# Patient Record
Sex: Female | Born: 1945
Health system: Southern US, Community
[De-identification: ages and names within clinical notes are randomized; demographics above are authoritative.]

## PROBLEM LIST (undated history)

## (undated) DIAGNOSIS — J101 Influenza due to other identified influenza virus with other respiratory manifestations: Secondary | ICD-10-CM

## (undated) DIAGNOSIS — E785 Hyperlipidemia, unspecified: Secondary | ICD-10-CM

## (undated) DIAGNOSIS — K219 Gastro-esophageal reflux disease without esophagitis: Secondary | ICD-10-CM

## (undated) DIAGNOSIS — Z9289 Personal history of other medical treatment: Secondary | ICD-10-CM

## (undated) DIAGNOSIS — K279 Peptic ulcer, site unspecified, unspecified as acute or chronic, without hemorrhage or perforation: Secondary | ICD-10-CM

## (undated) DIAGNOSIS — Z9889 Other specified postprocedural states: Secondary | ICD-10-CM

## (undated) DIAGNOSIS — I469 Cardiac arrest, cause unspecified: Secondary | ICD-10-CM

## (undated) DIAGNOSIS — R112 Nausea with vomiting, unspecified: Secondary | ICD-10-CM

## (undated) DIAGNOSIS — J189 Pneumonia, unspecified organism: Secondary | ICD-10-CM

## (undated) DIAGNOSIS — I10 Essential (primary) hypertension: Secondary | ICD-10-CM

## (undated) DIAGNOSIS — Z720 Tobacco use: Secondary | ICD-10-CM

## (undated) DIAGNOSIS — I2510 Atherosclerotic heart disease of native coronary artery without angina pectoris: Secondary | ICD-10-CM

## (undated) DIAGNOSIS — I219 Acute myocardial infarction, unspecified: Secondary | ICD-10-CM

## (undated) DIAGNOSIS — R55 Syncope and collapse: Secondary | ICD-10-CM

## (undated) DIAGNOSIS — I4891 Unspecified atrial fibrillation: Secondary | ICD-10-CM

## (undated) DIAGNOSIS — I493 Ventricular premature depolarization: Secondary | ICD-10-CM

## (undated) DIAGNOSIS — T8859XA Other complications of anesthesia, initial encounter: Secondary | ICD-10-CM

## (undated) HISTORY — DX: Ventricular premature depolarization: I49.3

## (undated) HISTORY — DX: Personal history of other medical treatment: Z92.89

## (undated) HISTORY — DX: Acute myocardial infarction, unspecified: I21.9

## (undated) HISTORY — PX: TUBAL LIGATION: SHX77

## (undated) HISTORY — DX: Atherosclerotic heart disease of native coronary artery without angina pectoris: I25.10

## (undated) HISTORY — DX: Unspecified atrial fibrillation: I48.91

## (undated) HISTORY — PX: BREAST EXCISIONAL BIOPSY: SUR124

## (undated) HISTORY — DX: Syncope and collapse: R55

## (undated) HISTORY — PX: CORONARY ANGIOPLASTY WITH STENT PLACEMENT: SHX49

---

## 1994-11-10 DIAGNOSIS — R55 Syncope and collapse: Secondary | ICD-10-CM

## 1994-11-10 HISTORY — DX: Syncope and collapse: R55

## 2011-11-21 DIAGNOSIS — J209 Acute bronchitis, unspecified: Secondary | ICD-10-CM | POA: Diagnosis not present

## 2012-03-14 DIAGNOSIS — J019 Acute sinusitis, unspecified: Secondary | ICD-10-CM | POA: Diagnosis not present

## 2012-07-22 DIAGNOSIS — H2510 Age-related nuclear cataract, unspecified eye: Secondary | ICD-10-CM | POA: Diagnosis not present

## 2012-10-10 DIAGNOSIS — I469 Cardiac arrest, cause unspecified: Secondary | ICD-10-CM

## 2012-10-10 DIAGNOSIS — J101 Influenza due to other identified influenza virus with other respiratory manifestations: Secondary | ICD-10-CM

## 2012-10-10 DIAGNOSIS — J189 Pneumonia, unspecified organism: Secondary | ICD-10-CM

## 2012-10-10 HISTORY — DX: Influenza due to other identified influenza virus with other respiratory manifestations: J10.1

## 2012-10-10 HISTORY — DX: Cardiac arrest, cause unspecified: I46.9

## 2012-10-10 HISTORY — DX: Pneumonia, unspecified organism: J18.9

## 2012-10-28 DIAGNOSIS — J209 Acute bronchitis, unspecified: Secondary | ICD-10-CM | POA: Diagnosis not present

## 2012-10-29 ENCOUNTER — Encounter (HOSPITAL_COMMUNITY): Payer: Self-pay | Admitting: Physician Assistant

## 2012-10-29 ENCOUNTER — Inpatient Hospital Stay (HOSPITAL_COMMUNITY)
Admission: EM | Admit: 2012-10-29 | Discharge: 2012-11-09 | DRG: 237 | Disposition: A | Discharge status: Rehabilitation Facility | Payer: Medicare Other | Attending: Cardiovascular Disease | Admitting: Cardiovascular Disease

## 2012-10-29 ENCOUNTER — Encounter (HOSPITAL_COMMUNITY): Payer: Self-pay | Admitting: Anesthesiology

## 2012-10-29 ENCOUNTER — Inpatient Hospital Stay (HOSPITAL_COMMUNITY): Payer: Medicare Other

## 2012-10-29 ENCOUNTER — Encounter (HOSPITAL_COMMUNITY)
Admission: EM | Disposition: A | Discharge status: Rehabilitation Facility | Payer: Self-pay | Source: Home / Self Care | Attending: Cardiovascular Disease

## 2012-10-29 ENCOUNTER — Emergency Department (HOSPITAL_COMMUNITY): Payer: Medicare Other | Admitting: Anesthesiology

## 2012-10-29 DIAGNOSIS — I2119 ST elevation (STEMI) myocardial infarction involving other coronary artery of inferior wall: Secondary | ICD-10-CM | POA: Diagnosis not present

## 2012-10-29 DIAGNOSIS — R4182 Altered mental status, unspecified: Secondary | ICD-10-CM | POA: Diagnosis not present

## 2012-10-29 DIAGNOSIS — G9340 Encephalopathy, unspecified: Secondary | ICD-10-CM | POA: Diagnosis not present

## 2012-10-29 DIAGNOSIS — R7309 Other abnormal glucose: Secondary | ICD-10-CM | POA: Diagnosis not present

## 2012-10-29 DIAGNOSIS — I252 Old myocardial infarction: Secondary | ICD-10-CM | POA: Diagnosis not present

## 2012-10-29 DIAGNOSIS — J4489 Other specified chronic obstructive pulmonary disease: Secondary | ICD-10-CM | POA: Diagnosis present

## 2012-10-29 DIAGNOSIS — I442 Atrioventricular block, complete: Secondary | ICD-10-CM | POA: Diagnosis present

## 2012-10-29 DIAGNOSIS — E872 Acidosis, unspecified: Secondary | ICD-10-CM | POA: Diagnosis not present

## 2012-10-29 DIAGNOSIS — J129 Viral pneumonia, unspecified: Secondary | ICD-10-CM | POA: Diagnosis present

## 2012-10-29 DIAGNOSIS — J95821 Acute postprocedural respiratory failure: Secondary | ICD-10-CM | POA: Diagnosis not present

## 2012-10-29 DIAGNOSIS — R570 Cardiogenic shock: Secondary | ICD-10-CM

## 2012-10-29 DIAGNOSIS — R5381 Other malaise: Secondary | ICD-10-CM | POA: Diagnosis not present

## 2012-10-29 DIAGNOSIS — J1100 Influenza due to unidentified influenza virus with unspecified type of pneumonia: Secondary | ICD-10-CM | POA: Diagnosis present

## 2012-10-29 DIAGNOSIS — J449 Chronic obstructive pulmonary disease, unspecified: Secondary | ICD-10-CM | POA: Diagnosis present

## 2012-10-29 DIAGNOSIS — F17200 Nicotine dependence, unspecified, uncomplicated: Secondary | ICD-10-CM | POA: Diagnosis present

## 2012-10-29 DIAGNOSIS — I469 Cardiac arrest, cause unspecified: Secondary | ICD-10-CM | POA: Diagnosis not present

## 2012-10-29 DIAGNOSIS — J189 Pneumonia, unspecified organism: Secondary | ICD-10-CM | POA: Diagnosis not present

## 2012-10-29 DIAGNOSIS — R079 Chest pain, unspecified: Secondary | ICD-10-CM | POA: Diagnosis not present

## 2012-10-29 DIAGNOSIS — E8779 Other fluid overload: Secondary | ICD-10-CM | POA: Diagnosis not present

## 2012-10-29 DIAGNOSIS — Z955 Presence of coronary angioplasty implant and graft: Secondary | ICD-10-CM

## 2012-10-29 DIAGNOSIS — K219 Gastro-esophageal reflux disease without esophagitis: Secondary | ICD-10-CM | POA: Diagnosis present

## 2012-10-29 DIAGNOSIS — Z452 Encounter for adjustment and management of vascular access device: Secondary | ICD-10-CM | POA: Diagnosis not present

## 2012-10-29 DIAGNOSIS — T502X5A Adverse effect of carbonic-anhydrase inhibitors, benzothiadiazides and other diuretics, initial encounter: Secondary | ICD-10-CM | POA: Diagnosis not present

## 2012-10-29 DIAGNOSIS — R195 Other fecal abnormalities: Secondary | ICD-10-CM | POA: Diagnosis not present

## 2012-10-29 DIAGNOSIS — D3500 Benign neoplasm of unspecified adrenal gland: Secondary | ICD-10-CM | POA: Diagnosis not present

## 2012-10-29 DIAGNOSIS — G9349 Other encephalopathy: Secondary | ICD-10-CM | POA: Diagnosis not present

## 2012-10-29 DIAGNOSIS — D62 Acute posthemorrhagic anemia: Secondary | ICD-10-CM

## 2012-10-29 DIAGNOSIS — E785 Hyperlipidemia, unspecified: Secondary | ICD-10-CM

## 2012-10-29 DIAGNOSIS — Z4682 Encounter for fitting and adjustment of non-vascular catheter: Secondary | ICD-10-CM | POA: Diagnosis not present

## 2012-10-29 DIAGNOSIS — J9819 Other pulmonary collapse: Secondary | ICD-10-CM | POA: Diagnosis not present

## 2012-10-29 DIAGNOSIS — J90 Pleural effusion, not elsewhere classified: Secondary | ICD-10-CM | POA: Diagnosis not present

## 2012-10-29 DIAGNOSIS — Z5189 Encounter for other specified aftercare: Secondary | ICD-10-CM | POA: Diagnosis not present

## 2012-10-29 DIAGNOSIS — Z720 Tobacco use: Secondary | ICD-10-CM

## 2012-10-29 DIAGNOSIS — Z79899 Other long term (current) drug therapy: Secondary | ICD-10-CM

## 2012-10-29 DIAGNOSIS — J9600 Acute respiratory failure, unspecified whether with hypoxia or hypercapnia: Secondary | ICD-10-CM | POA: Diagnosis not present

## 2012-10-29 DIAGNOSIS — R0989 Other specified symptoms and signs involving the circulatory and respiratory systems: Secondary | ICD-10-CM | POA: Diagnosis not present

## 2012-10-29 DIAGNOSIS — R29898 Other symptoms and signs involving the musculoskeletal system: Secondary | ICD-10-CM

## 2012-10-29 DIAGNOSIS — J988 Other specified respiratory disorders: Secondary | ICD-10-CM | POA: Diagnosis not present

## 2012-10-29 DIAGNOSIS — I4901 Ventricular fibrillation: Secondary | ICD-10-CM | POA: Diagnosis not present

## 2012-10-29 DIAGNOSIS — R059 Cough, unspecified: Secondary | ICD-10-CM | POA: Diagnosis not present

## 2012-10-29 DIAGNOSIS — J1000 Influenza due to other identified influenza virus with unspecified type of pneumonia: Secondary | ICD-10-CM | POA: Diagnosis not present

## 2012-10-29 DIAGNOSIS — F341 Dysthymic disorder: Secondary | ICD-10-CM | POA: Diagnosis not present

## 2012-10-29 DIAGNOSIS — J811 Chronic pulmonary edema: Secondary | ICD-10-CM | POA: Diagnosis not present

## 2012-10-29 DIAGNOSIS — I498 Other specified cardiac arrhythmias: Secondary | ICD-10-CM | POA: Diagnosis present

## 2012-10-29 DIAGNOSIS — I472 Ventricular tachycardia: Secondary | ICD-10-CM

## 2012-10-29 DIAGNOSIS — E876 Hypokalemia: Secondary | ICD-10-CM | POA: Diagnosis not present

## 2012-10-29 DIAGNOSIS — I10 Essential (primary) hypertension: Secondary | ICD-10-CM | POA: Diagnosis not present

## 2012-10-29 DIAGNOSIS — I2510 Atherosclerotic heart disease of native coronary artery without angina pectoris: Secondary | ICD-10-CM

## 2012-10-29 DIAGNOSIS — I4729 Other ventricular tachycardia: Secondary | ICD-10-CM

## 2012-10-29 DIAGNOSIS — R110 Nausea: Secondary | ICD-10-CM | POA: Diagnosis not present

## 2012-10-29 DIAGNOSIS — G931 Anoxic brain damage, not elsewhere classified: Secondary | ICD-10-CM | POA: Diagnosis not present

## 2012-10-29 DIAGNOSIS — F22 Delusional disorders: Secondary | ICD-10-CM

## 2012-10-29 HISTORY — DX: Other specified postprocedural states: Z98.890

## 2012-10-29 HISTORY — DX: Other specified postprocedural states: R11.2

## 2012-10-29 HISTORY — PX: PERCUTANEOUS CORONARY STENT INTERVENTION (PCI-S): SHX5485

## 2012-10-29 HISTORY — DX: Pneumonia, unspecified organism: J18.9

## 2012-10-29 HISTORY — PX: LEFT HEART CATHETERIZATION WITH CORONARY ANGIOGRAM: SHX5451

## 2012-10-29 HISTORY — DX: Influenza due to other identified influenza virus with other respiratory manifestations: J10.1

## 2012-10-29 HISTORY — DX: Essential (primary) hypertension: I10

## 2012-10-29 HISTORY — DX: Hyperlipidemia, unspecified: E78.5

## 2012-10-29 HISTORY — DX: Gastro-esophageal reflux disease without esophagitis: K21.9

## 2012-10-29 HISTORY — DX: Tobacco use: Z72.0

## 2012-10-29 HISTORY — DX: Other complications of anesthesia, initial encounter: T88.59XA

## 2012-10-29 HISTORY — PX: INTRA-AORTIC BALLOON PUMP INSERTION: SHX5475

## 2012-10-29 HISTORY — DX: Peptic ulcer, site unspecified, unspecified as acute or chronic, without hemorrhage or perforation: K27.9

## 2012-10-29 HISTORY — DX: Cardiac arrest, cause unspecified: I46.9

## 2012-10-29 LAB — COMPREHENSIVE METABOLIC PANEL
AST: 33 U/L (ref 0–37)
BUN: 15 mg/dL (ref 6–23)
CO2: 17 mEq/L — ABNORMAL LOW (ref 19–32)
Chloride: 103 mEq/L (ref 96–112)
Creatinine, Ser: 0.72 mg/dL (ref 0.50–1.10)
GFR calc Af Amer: >90 mL/min (ref >90)
GFR calc non Af Amer: 86 mL/min — ABNORMAL LOW (ref >90)
Glucose, Bld: 249 mg/dL — ABNORMAL HIGH (ref 70–99)
Total Bilirubin: 0.1 mg/dL — ABNORMAL LOW (ref 0.3–1.2)

## 2012-10-29 LAB — BASIC METABOLIC PANEL
CO2: 23 mEq/L (ref 19–32)
Calcium: 8.2 mg/dL — ABNORMAL LOW (ref 8.4–10.5)
Chloride: 103 mEq/L (ref 96–112)
Creatinine, Ser: 0.6 mg/dL (ref 0.50–1.10)
GFR calc Af Amer: >90 mL/min (ref >90)
GFR calc Af Amer: >90 mL/min (ref >90)
GFR calc non Af Amer: >90 mL/min (ref >90)
GFR calc non Af Amer: 85 mL/min — ABNORMAL LOW (ref >90)
Sodium: 142 mEq/L (ref 135–145)

## 2012-10-29 LAB — CBC WITH DIFFERENTIAL/PLATELET
Basophils Absolute: 0 10*3/uL (ref 0.0–0.1)
Eosinophils Relative: 1 % (ref 0–5)
HCT: 41.3 % (ref 36.0–46.0)
Hemoglobin: 13.5 g/dL (ref 12.0–15.0)
Lymphocytes Relative: 18 % (ref 12–46)
Lymphs Abs: 2.8 10*3/uL (ref 0.7–4.0)
MCV: 96.5 fL (ref 78.0–100.0)
Monocytes Absolute: 1.7 10*3/uL — ABNORMAL HIGH (ref 0.1–1.0)
Monocytes Relative: 11 % (ref 3–12)
Neutro Abs: 10.7 10*3/uL — ABNORMAL HIGH (ref 1.7–7.7)
RBC: 4.28 MIL/uL (ref 3.87–5.11)
WBC: 15.3 10*3/uL — ABNORMAL HIGH (ref 4.0–10.5)

## 2012-10-29 LAB — BLOOD GAS, ARTERIAL
Drawn by: 129711
MECHVT: 400 mL
PEEP: 5 cmH2O
RATE: 24 resp/min
pCO2 arterial: 54.1 mmHg — ABNORMAL HIGH (ref 35.0–45.0)
pH, Arterial: 7.253 — ABNORMAL LOW (ref 7.350–7.450)

## 2012-10-29 LAB — LIPID PANEL
Cholesterol: 187 mg/dL (ref 0–200)
HDL: 41 mg/dL (ref >39)
LDL Cholesterol: 107 mg/dL — ABNORMAL HIGH (ref 0–99)
Triglycerides: 194 mg/dL — ABNORMAL HIGH (ref <150)
VLDL: 39 mg/dL (ref 0–40)

## 2012-10-29 LAB — POCT I-STAT 3, ART BLOOD GAS (G3+)
Acid-base deficit: 13 mmol/L — ABNORMAL HIGH (ref 0.0–2.0)
Acid-base deficit: 6 mmol/L — ABNORMAL HIGH (ref 0.0–2.0)
Bicarbonate: 21.4 mEq/L (ref 20.0–24.0)
Bicarbonate: 24.5 mEq/L — ABNORMAL HIGH (ref 20.0–24.0)
Patient temperature: 95.2
TCO2: 18 mmol/L (ref 0–100)
TCO2: 23 mmol/L (ref 0–100)
TCO2: 22 mmol/L (ref 0–100)
pCO2 arterial: 55.9 mmHg — ABNORMAL HIGH (ref 35.0–45.0)
pH, Arterial: 7.103 — CL (ref 7.350–7.450)
pH, Arterial: 7.18 — CL (ref 7.350–7.450)
pH, Arterial: 7.146 — CL (ref 7.350–7.450)
pH, Arterial: 7.282 — ABNORMAL LOW (ref 7.350–7.450)
pO2, Arterial: 87 mmHg (ref 80.0–100.0)
pO2, Arterial: 85 mmHg (ref 80.0–100.0)

## 2012-10-29 LAB — TROPONIN I
Troponin I: <0.3 ng/mL (ref <0.30)
Troponin I: >20 ng/mL (ref <0.30)

## 2012-10-29 LAB — CBC
HCT: 38 % (ref 36.0–46.0)
MCH: 31.1 pg (ref 26.0–34.0)
MCH: 31.1 pg (ref 26.0–34.0)
MCHC: 33.6 g/dL (ref 30.0–36.0)
MCHC: 33.2 g/dL (ref 30.0–36.0)
MCV: 92.5 fL (ref 78.0–100.0)
Platelets: 305 10*3/uL (ref 150–400)
RDW: 14.6 % (ref 11.5–15.5)

## 2012-10-29 LAB — MAGNESIUM: Magnesium: 1.5 mg/dL (ref 1.5–2.5)

## 2012-10-29 LAB — INFLUENZA PANEL BY PCR (TYPE A & B)
Influenza A By PCR: POSITIVE — AB
Influenza B By PCR: NEGATIVE

## 2012-10-29 LAB — GLUCOSE, CAPILLARY
Glucose-Capillary: 284 mg/dL — ABNORMAL HIGH (ref 70–99)
Glucose-Capillary: 187 mg/dL — ABNORMAL HIGH (ref 70–99)

## 2012-10-29 LAB — POCT I-STAT, CHEM 8
Calcium, Ion: 1.06 mmol/L — ABNORMAL LOW (ref 1.13–1.30)
Creatinine, Ser: 0.6 mg/dL (ref 0.50–1.10)
Hemoglobin: 12.9 g/dL (ref 12.0–15.0)
Sodium: 128 mEq/L — ABNORMAL LOW (ref 135–145)
TCO2: 17 mmol/L (ref 0–100)

## 2012-10-29 LAB — STREP PNEUMONIAE URINARY ANTIGEN: Strep Pneumo Urinary Antigen: NEGATIVE

## 2012-10-29 LAB — MRSA PCR SCREENING: MRSA by PCR: NEGATIVE

## 2012-10-29 LAB — LACTIC ACID, PLASMA: Lactic Acid, Venous: 3 mmol/L — ABNORMAL HIGH (ref 0.5–2.2)

## 2012-10-29 LAB — PROTIME-INR: INR: 1.03 (ref 0.00–1.49)

## 2012-10-29 LAB — PROCALCITONIN: Procalcitonin: <0.1 ng/mL

## 2012-10-29 IMAGING — CR DG CHEST 1V PORT
1 series · 1 of 1 positions shown · non-contrast
Comparison: None.

CLINICAL DATA: Respiratory difficulty

PORTABLE CHEST - 1 VIEW

[AP]
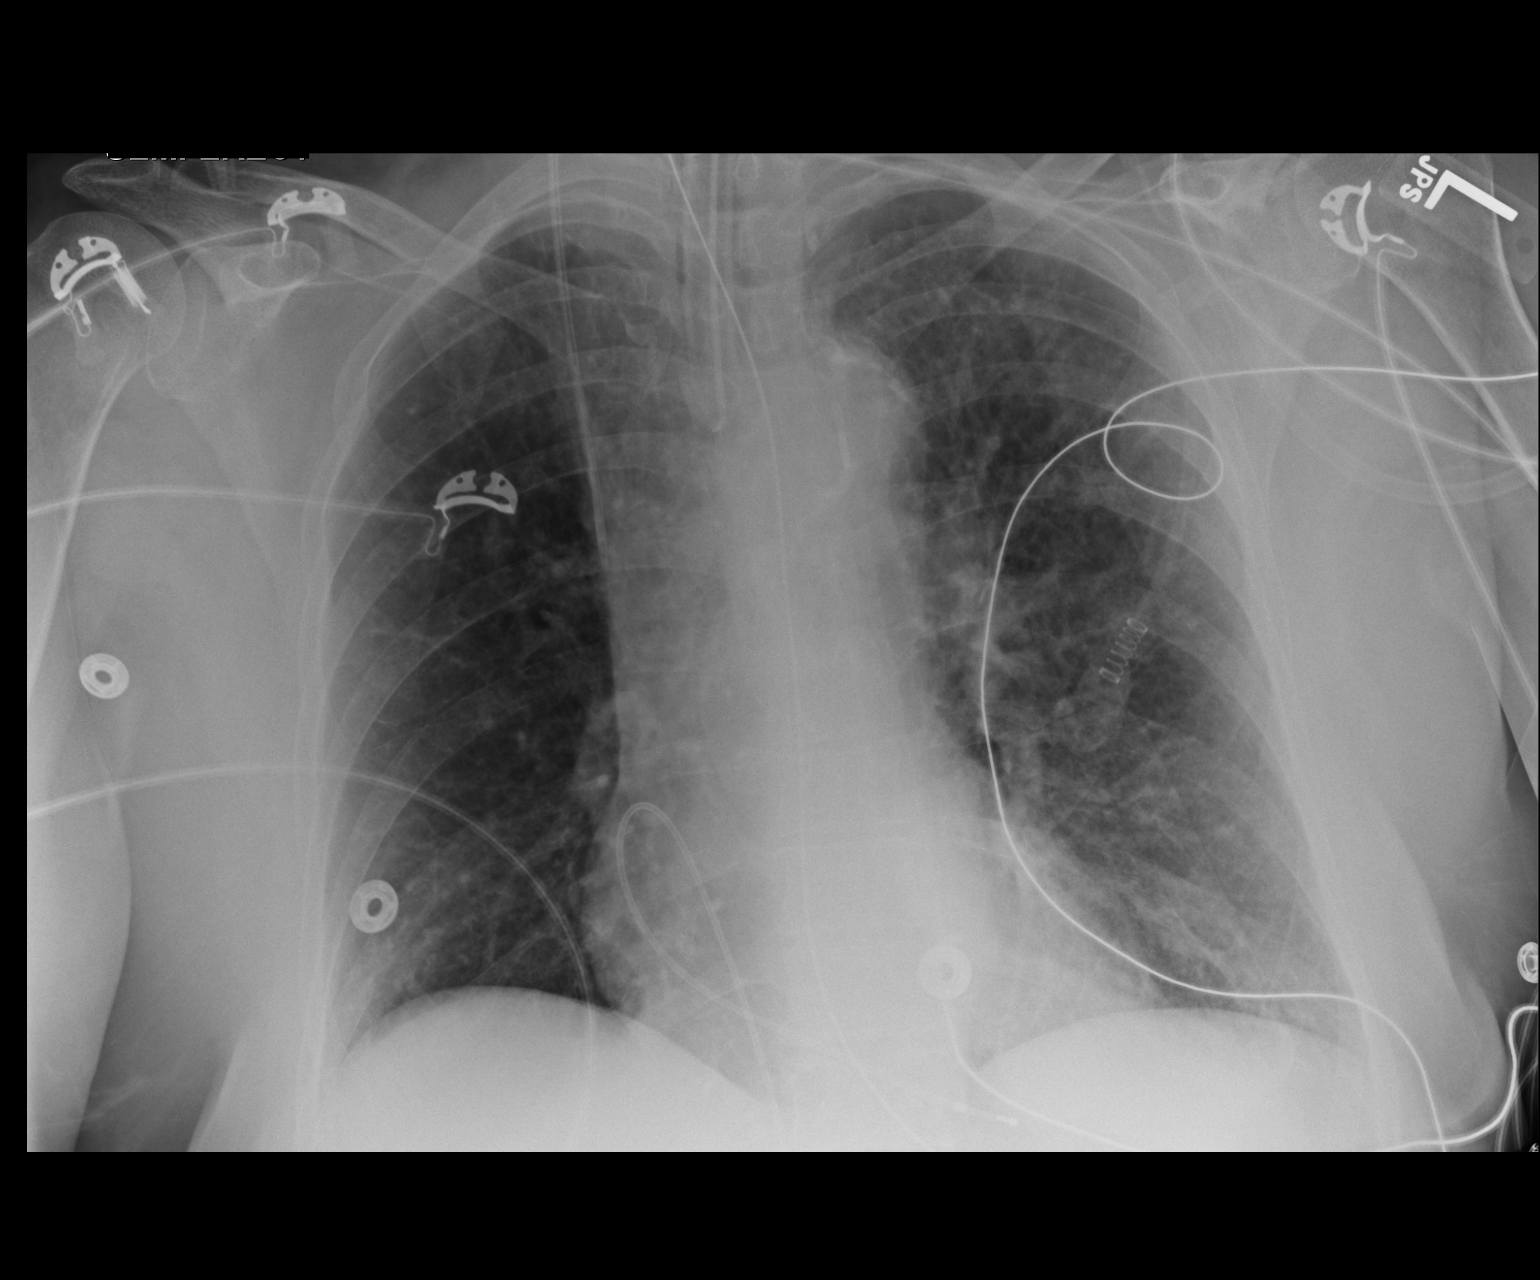

[1 of 1 positions shown; findings below may reference images not displayed]

FINDINGS: Endotracheal tube is 3.9 cm from the carina.  NG tube is
beyond the gastroesophageal junction.  Transvenous pacemaker from
the IVC in place with its tip projecting over the tip of the right
ventricle.  Right internal jugular vein center venous catheter tip
in the mid SVC.  Intra-aortic balloon pump tip in the proximal
descending aorta.  Low lung volumes.  Normal heart size.  No
pneumothorax.  Minimal patchy density at the lung bases.
IMPRESSION: Tubular structures, transvenous pacer and intra-aortic balloon pump
appropriately positioned.

Bibasilar atelectasis verses airspace disease.

## 2012-10-29 SURGERY — LEFT HEART CATHETERIZATION WITH CORONARY ANGIOGRAM
Anesthesia: LOCAL

## 2012-10-29 MED ORDER — ACETAMINOPHEN 325 MG PO TABS: 650.0000 mg | ORAL_TABLET | ORAL | Status: DC | PRN

## 2012-10-29 MED ORDER — INSULIN ASPART 100 UNIT/ML ~~LOC~~ SOLN
2.0000 [IU] | SUBCUTANEOUS | Status: DC
  Administered 2012-10-29: 6 [IU] via SUBCUTANEOUS

## 2012-10-29 MED ORDER — ATORVASTATIN CALCIUM 80 MG PO TABS
80.0000 mg | ORAL_TABLET | Freq: Every day | ORAL | Status: DC
  Administered 2012-10-29 – 2012-11-08 (×9): 80 mg via NASOGASTRIC
  Filled 2012-10-29 (×13): qty 1

## 2012-10-29 MED ORDER — BIVALIRUDIN 250 MG IV SOLR
INTRAVENOUS | Status: AC
  Filled 2012-10-29: qty 250

## 2012-10-29 MED ORDER — BIOTENE DRY MOUTH MT LIQD
15.0000 mL | Freq: Four times a day (QID) | OROMUCOSAL | Status: DC
  Administered 2012-10-29 – 2012-11-09 (×40): 15 mL via OROMUCOSAL

## 2012-10-29 MED ORDER — ASPIRIN EC 81 MG PO TBEC: 81.0000 mg | DELAYED_RELEASE_TABLET | Freq: Every day | ORAL | Status: DC

## 2012-10-29 MED ORDER — AMIODARONE HCL 150 MG/3ML IV SOLN
INTRAVENOUS | Status: AC
  Filled 2012-10-29: qty 3

## 2012-10-29 MED ORDER — AMIODARONE HCL IN DEXTROSE 360-4.14 MG/200ML-% IV SOLN
30.0000 mg/h | INTRAVENOUS | Status: DC
  Administered 2012-10-29 – 2012-10-30 (×2): 30 mg/h via INTRAVENOUS
  Filled 2012-10-29 (×2): qty 200

## 2012-10-29 MED ORDER — SODIUM BICARBONATE 8.4 % IV SOLN
INTRAVENOUS | Status: AC
  Filled 2012-10-29: qty 100

## 2012-10-29 MED ORDER — SODIUM CHLORIDE 0.9 % IV SOLN
250.0000 mL | INTRAVENOUS | Status: DC | PRN
  Administered 2012-10-30: 250 mL via INTRAVENOUS

## 2012-10-29 MED ORDER — POTASSIUM CHLORIDE 20 MEQ/15ML (10%) PO LIQD
ORAL | Status: AC
  Administered 2012-10-29: 40 meq via ORAL
  Filled 2012-10-29: qty 30

## 2012-10-29 MED ORDER — OSELTAMIVIR PHOSPHATE 75 MG PO CAPS
75.0000 mg | ORAL_CAPSULE | Freq: Every day | ORAL | Status: DC
  Administered 2012-10-29 – 2012-10-30 (×2): 75 mg via ORAL
  Filled 2012-10-29 (×2): qty 1

## 2012-10-29 MED ORDER — ALPRAZOLAM 0.25 MG PO TABS: 0.2500 mg | ORAL_TABLET | Freq: Two times a day (BID) | ORAL | Status: DC | PRN

## 2012-10-29 MED ORDER — NITROGLYCERIN 0.4 MG SL SUBL: 0.4000 mg | SUBLINGUAL_TABLET | SUBLINGUAL | Status: DC | PRN

## 2012-10-29 MED ORDER — ONDANSETRON HCL 4 MG/2ML IJ SOLN
INTRAMUSCULAR | Status: AC
  Filled 2012-10-29: qty 4

## 2012-10-29 MED ORDER — PRASUGREL HCL 10 MG PO TABS
60.0000 mg | ORAL_TABLET | ORAL | Status: AC
  Administered 2012-10-29: 60 mg

## 2012-10-29 MED ORDER — VECURONIUM BROMIDE 10 MG IV SOLR
INTRAVENOUS | Status: DC | PRN
  Administered 2012-10-29: 10 mg via INTRAVENOUS

## 2012-10-29 MED ORDER — NOREPINEPHRINE BITARTRATE 1 MG/ML IJ SOLN
2.0000 ug/min | INTRAVENOUS | Status: DC
  Administered 2012-10-29: 30 ug/min via INTRAVENOUS
  Administered 2012-10-29: 25 ug/min via INTRAVENOUS
  Administered 2012-10-30: 14 ug/min via INTRAVENOUS
  Administered 2012-11-01: 10 ug/min via INTRAVENOUS
  Administered 2012-11-02: 6 ug/min via INTRAVENOUS
  Administered 2012-11-02: 8 ug/min via INTRAVENOUS
  Filled 2012-10-29 (×5): qty 16

## 2012-10-29 MED ORDER — ONDANSETRON HCL 4 MG/2ML IJ SOLN
INTRAMUSCULAR | Status: AC
  Filled 2012-10-29: qty 2

## 2012-10-29 MED ORDER — CHLORHEXIDINE GLUCONATE 0.12 % MT SOLN
15.0000 mL | Freq: Two times a day (BID) | OROMUCOSAL | Status: DC
  Administered 2012-10-29 – 2012-11-04 (×14): 15 mL via OROMUCOSAL
  Filled 2012-10-29 (×13): qty 15

## 2012-10-29 MED ORDER — SODIUM BICARBONATE 8.4 % IV SOLN
INTRAVENOUS | Status: DC
  Filled 2012-10-29: qty 150

## 2012-10-29 MED ORDER — SODIUM CHLORIDE 0.9 % IV SOLN
25.0000 ug/h | INTRAVENOUS | Status: DC
  Administered 2012-10-30 (×2): 200 ug/h via INTRAVENOUS
  Administered 2012-10-31: 175 ug/h via INTRAVENOUS
  Administered 2012-10-31 – 2012-11-01 (×2): 200 ug/h via INTRAVENOUS
  Administered 2012-11-01: 75 ug/h via INTRAVENOUS
  Administered 2012-11-03: 150 ug/h via INTRAVENOUS
  Administered 2012-11-04: 100 ug/h via INTRAVENOUS
  Filled 2012-10-29 (×9): qty 50

## 2012-10-29 MED ORDER — NITROGLYCERIN 0.2 MG/ML ON CALL CATH LAB
INTRAVENOUS | Status: AC
  Filled 2012-10-29: qty 1

## 2012-10-29 MED ORDER — HEPARIN SODIUM (PORCINE) 5000 UNIT/ML IJ SOLN
4000.0000 [IU]/kg | Freq: Once | INTRAMUSCULAR | Status: AC
  Administered 2012-10-29: 4000 [IU] via INTRAVENOUS

## 2012-10-29 MED ORDER — SODIUM CHLORIDE 0.9 % IV SOLN
1.0000 mg/h | INTRAVENOUS | Status: DC
  Administered 2012-10-29 – 2012-10-30 (×3): 3 mg/h via INTRAVENOUS
  Administered 2012-10-31: 5 mg/h via INTRAVENOUS
  Administered 2012-10-31: 4 mg/h via INTRAVENOUS
  Administered 2012-11-01: 3 mg/h via INTRAVENOUS
  Administered 2012-11-02: 1 mg/h via INTRAVENOUS
  Administered 2012-11-03 – 2012-11-04 (×3): 4 mg/h via INTRAVENOUS
  Filled 2012-10-29 (×10): qty 10

## 2012-10-29 MED ORDER — SUCCINYLCHOLINE CHLORIDE 20 MG/ML IJ SOLN
INTRAMUSCULAR | Status: DC | PRN
  Administered 2012-10-29: 100 mg via INTRAVENOUS

## 2012-10-29 MED ORDER — NOREPINEPHRINE BITARTRATE 1 MG/ML IJ SOLN
2.0000 ug/min | INTRAVENOUS | Status: DC
  Filled 2012-10-29: qty 16

## 2012-10-29 MED ORDER — SODIUM CHLORIDE 0.9 % IV BOLUS (SEPSIS): 250.0000 mL | INTRAVENOUS | Status: DC | PRN

## 2012-10-29 MED ORDER — HEPARIN (PORCINE) IN NACL 2-0.9 UNIT/ML-% IJ SOLN
INTRAMUSCULAR | Status: AC
  Filled 2012-10-29: qty 1000

## 2012-10-29 MED ORDER — SODIUM CHLORIDE 0.9 % IV SOLN
INTRAVENOUS | Status: DC
  Administered 2012-10-29 – 2012-10-30 (×2): 125 mL/h via INTRAVENOUS

## 2012-10-29 MED ORDER — MIDAZOLAM HCL 2 MG/2ML IJ SOLN: 1.0000 mg | INTRAMUSCULAR | Status: DC | PRN

## 2012-10-29 MED ORDER — SODIUM BICARBONATE 8.4 % IV SOLN
INTRAVENOUS | Status: AC
  Filled 2012-10-29: qty 150

## 2012-10-29 MED ORDER — NOREPINEPHRINE BITARTRATE 1 MG/ML IJ SOLN
INTRAMUSCULAR | Status: AC
  Filled 2012-10-29: qty 4

## 2012-10-29 MED ORDER — ETOMIDATE 2 MG/ML IV SOLN
INTRAVENOUS | Status: DC | PRN
  Administered 2012-10-29: 12 mg via INTRAVENOUS

## 2012-10-29 MED ORDER — POTASSIUM CHLORIDE 10 MEQ/50ML IV SOLN
INTRAVENOUS | Status: AC
  Administered 2012-10-29: 10 meq via INTRAVENOUS
  Filled 2012-10-29: qty 100

## 2012-10-29 MED ORDER — FENTANYL BOLUS VIA INFUSION
25.0000 ug | Freq: Four times a day (QID) | INTRAVENOUS | Status: DC | PRN
  Administered 2012-11-03: 25 ug via INTRAVENOUS
  Filled 2012-10-29: qty 100

## 2012-10-29 MED ORDER — ZOLPIDEM TARTRATE 5 MG PO TABS: 5.0000 mg | ORAL_TABLET | Freq: Every evening | ORAL | Status: DC | PRN

## 2012-10-29 MED ORDER — LIDOCAINE HCL (PF) 1 % IJ SOLN
INTRAMUSCULAR | Status: AC
  Filled 2012-10-29: qty 30

## 2012-10-29 MED ORDER — SODIUM CHLORIDE 0.9 % IJ SOLN
3.0000 mL | Freq: Two times a day (BID) | INTRAMUSCULAR | Status: DC
  Administered 2012-10-29 – 2012-11-09 (×14): 3 mL via INTRAVENOUS

## 2012-10-29 MED ORDER — PHENYLEPHRINE HCL 10 MG/ML IJ SOLN
INTRAMUSCULAR | Status: AC
  Filled 2012-10-29: qty 1

## 2012-10-29 MED ORDER — SODIUM CHLORIDE 0.9 % IJ SOLN: 3.0000 mL | INTRAMUSCULAR | Status: DC | PRN

## 2012-10-29 MED ORDER — MIDAZOLAM BOLUS VIA INFUSION
1.0000 mg | INTRAVENOUS | Status: DC | PRN
  Administered 2012-11-03: 2 mg via INTRAVENOUS
  Administered 2012-11-03: 1 mg via INTRAVENOUS
  Administered 2012-11-03: 2 mg via INTRAVENOUS
  Filled 2012-10-29: qty 2

## 2012-10-29 MED ORDER — PRASUGREL HCL 10 MG PO TABS
10.0000 mg | ORAL_TABLET | Freq: Every day | ORAL | Status: DC
  Administered 2012-10-30 – 2012-11-09 (×11): 10 mg via NASOGASTRIC
  Filled 2012-10-29 (×5): qty 1
  Filled 2012-10-29: qty 6
  Filled 2012-10-29 (×6): qty 1

## 2012-10-29 MED ORDER — DOPAMINE-DEXTROSE 3.2-5 MG/ML-% IV SOLN
2.0000 ug/kg/min | INTRAVENOUS | Status: DC
  Administered 2012-10-29: 5 ug/kg/min via INTRAVENOUS

## 2012-10-29 MED ORDER — POTASSIUM CHLORIDE 20 MEQ/15ML (10%) PO LIQD
40.0000 meq | Freq: Once | ORAL | Status: AC
  Administered 2012-10-29: 40 meq via ORAL
  Filled 2012-10-29: qty 30

## 2012-10-29 MED ORDER — ALBUTEROL SULFATE HFA 108 (90 BASE) MCG/ACT IN AERS
4.0000 | INHALATION_SPRAY | RESPIRATORY_TRACT | Status: DC
  Administered 2012-10-29 – 2012-10-31 (×10): 4 via RESPIRATORY_TRACT
  Filled 2012-10-29: qty 6.7

## 2012-10-29 MED ORDER — MIDAZOLAM HCL 2 MG/2ML IJ SOLN
INTRAMUSCULAR | Status: AC
  Filled 2012-10-29: qty 2

## 2012-10-29 MED ORDER — FENTANYL CITRATE 0.05 MG/ML IJ SOLN: 25.0000 ug | INTRAMUSCULAR | Status: DC | PRN

## 2012-10-29 MED ORDER — EPTIFIBATIDE 75 MG/100ML IV SOLN
INTRAVENOUS | Status: AC
  Filled 2012-10-29: qty 100

## 2012-10-29 MED ORDER — POTASSIUM CHLORIDE 10 MEQ/50ML IV SOLN
10.0000 meq | INTRAVENOUS | Status: AC
  Administered 2012-10-29 (×2): 10 meq via INTRAVENOUS

## 2012-10-29 MED ORDER — ONDANSETRON HCL 4 MG/2ML IJ SOLN: 4.0000 mg | Freq: Four times a day (QID) | INTRAMUSCULAR | Status: DC | PRN

## 2012-10-29 MED ORDER — ONDANSETRON HCL 4 MG/2ML IJ SOLN
4.0000 mg | Freq: Four times a day (QID) | INTRAMUSCULAR | Status: DC | PRN
  Administered 2012-11-04 – 2012-11-05 (×2): 4 mg via INTRAVENOUS
  Filled 2012-10-29 (×2): qty 2

## 2012-10-29 MED ORDER — AMIODARONE LOAD VIA INFUSION
150.0000 mg | Freq: Once | INTRAVENOUS | Status: AC
  Administered 2012-10-29: 150 mg via INTRAVENOUS
  Filled 2012-10-29: qty 83.34

## 2012-10-29 MED ORDER — NOREPINEPHRINE BITARTRATE 1 MG/ML IJ SOLN: 2.0000 ug/min | INTRAVENOUS | Status: DC

## 2012-10-29 MED ORDER — AMIODARONE HCL IN DEXTROSE 360-4.14 MG/200ML-% IV SOLN
60.0000 mg/h | INTRAVENOUS | Status: AC
  Administered 2012-10-29: 33.33 mL/h via INTRAVENOUS
  Filled 2012-10-29: qty 200

## 2012-10-29 MED ORDER — PANTOPRAZOLE SODIUM 40 MG IV SOLR
40.0000 mg | Freq: Every day | INTRAVENOUS | Status: DC
  Administered 2012-10-29 – 2012-10-30 (×2): 40 mg via INTRAVENOUS
  Filled 2012-10-29 (×2): qty 40

## 2012-10-29 MED ORDER — DOPAMINE-DEXTROSE 3.2-5 MG/ML-% IV SOLN
INTRAVENOUS | Status: AC
  Filled 2012-10-29: qty 250

## 2012-10-29 MED ORDER — LIDOCAINE HCL (CARDIAC) 20 MG/ML IV SOLN
INTRAVENOUS | Status: DC | PRN
  Administered 2012-10-29: 60 mg via INTRAVENOUS

## 2012-10-29 MED ORDER — EPTIFIBATIDE 75 MG/100ML IV SOLN
2.0000 ug/kg/min | INTRAVENOUS | Status: AC
  Administered 2012-10-29: 2 ug/kg/min via INTRAVENOUS
  Filled 2012-10-29 (×4): qty 100

## 2012-10-29 MED ORDER — PRASUGREL HCL 10 MG PO TABS: 10.0000 mg | ORAL_TABLET | Freq: Every day | ORAL | Status: DC

## 2012-10-29 MED ORDER — PANTOPRAZOLE SODIUM 40 MG PO TBEC: 40.0000 mg | DELAYED_RELEASE_TABLET | Freq: Every day | ORAL | Status: DC

## 2012-10-29 MED ORDER — SODIUM CHLORIDE 0.9 % IV SOLN
INTRAVENOUS | Status: DC
  Administered 2012-10-29: 1.5 [IU]/h via INTRAVENOUS
  Filled 2012-10-29: qty 1

## 2012-10-29 MED ORDER — ATORVASTATIN CALCIUM 80 MG PO TABS
80.0000 mg | ORAL_TABLET | Freq: Every day | ORAL | Status: DC
  Filled 2012-10-29: qty 1

## 2012-10-29 MED ORDER — ASPIRIN 81 MG PO CHEW
81.0000 mg | CHEWABLE_TABLET | Freq: Every day | ORAL | Status: DC
  Administered 2012-10-30 – 2012-11-05 (×7): 81 mg via NASOGASTRIC
  Filled 2012-10-29 (×7): qty 1

## 2012-10-29 NOTE — Procedures (Signed)
Central Venous Catheter Insertion Procedure Note Samantha King [id number] 1946-06-12  Procedure: Insertion of Central Venous Catheter Indications: Assessment of intravascular volume  Procedure Details Consent: Unable to obtain consent because of emergent medical necessity. Time Out: Verified patient identification, verified procedure, site/side was marked, verified correct patient position, special equipment/implants available, medications/allergies/relevent history reviewed, required imaging and test results available.  Performed  Maximum sterile technique was used including antiseptics, cap, gloves, gown, hand hygiene, mask and sheet. Skin prep: Chlorhexidine; local anesthetic administered A antimicrobial bonded/coated triple lumen catheter was placed in the right internal jugular vein using the Seldinger technique.  Evaluation Blood flow good Complications: No apparent complications Patient did tolerate procedure well. Chest X-ray ordered to verify placement.  CXR: pending.  U/S used in placement, picture in chart.  Tonia Avino 10/29/2012, 2:09 PM

## 2012-10-29 NOTE — ED Notes (Signed)
Cath Lab ready, Pt transported to Cath Lab.

## 2012-10-29 NOTE — Progress Notes (Signed)
PM Note.   Pt remains critically ill on multiple vasopressors, IABP in place, intubated. Rhythm is stable, currently sinus. IABP at 1:1. Hypotension earlier when pressors weaned. Vent adjusted for acidosis and pressors increased, now stable.  She is oozing blood from her mouth/ET tube. If she continues to ooze tonight, could stop Integrilin drip and start low dose heparin drip for balloon pump. She has been loaded with Effient.   Echo in am.   Family updated in waiting room. I spoke to both sons and her husband.   MCALHANY,CHRISTOPHER 6:30 PM 10/29/2012

## 2012-10-29 NOTE — Anesthesia Postprocedure Evaluation (Signed)
  Anesthesia Post-op Note  Patient: Samantha King  Procedure(s) Performed: * No procedures listed *  Patient Location: Cath Lab  Anesthesia Type:General  Level of Consciousness: sedated, unresponsive and Patient remains intubated per anesthesia plan  Airway and Oxygen Therapy: Patient remains intubated per anesthesia plan and Patient placed on Ventilator (see vital sign flow sheet for setting)  Post-op Pain: none  Post-op Assessment: Post-op Vital signs reviewed, PATIENT'S CARDIOVASCULAR STATUS UNSTABLE and RESPIRATORY FUNCTION UNSTABLE  Post-op Vital Signs: unstable  Complications: No apparent anesthesia complications

## 2012-10-29 NOTE — H&P (Signed)
History and Physical   Patient ID: Samantha King MRN: [id number], DOB/AGE: 01-05-1946   Admit date: 10/29/2012 Date of Consult: 10/29/2012   Primary Physician: Samantha King per patient Primary Cardiologist: Samantha King  HPI: Samantha King is a 66yo female with no prior cardiac history. She denied past medical history. Does take amlodipine, omeprazole and recently filled a prescription for Tamiflu. History was supplemented by EMS d/t acuity of situation and patient somnolence. The patient called EMS this morning complaining initially of hand pain, which developed into severe chest pain radiating into her left arm and jaw with associated diaphoresis. VSS at that time. ECG revealed inferolateral STEMI. She arrested en route. CPR was initiated for 1 minute. She was defibrillated with successful return of circulation. She was bolused with 500 cc normal saline en route. On ED arrival, repeat EKG confirmed inferolateral STEMI and code STEMI pathway initiated. HR 40s. BP 75/49, RR 17, O2 sat 100% on NRB. She was alert and oriented to person and place. Heparin bolus was given. She was transported emergently to the cath lab. Brother with MI. H/o tobacco abuse- 20 pack-years, quit 12 years ago. Denies prior surgeries. Denies allergies.   Problem List: Past Medical History  Diagnosis Date  . Hypertension   . MI (myocardial infarction)   . GERD (gastroesophageal reflux disease)     No past surgical history on file.   Allergies: Allergies not on file  Home Medications: Prior to Admission medications   Not on File  Norvasc  Omeprazole Tamiflu  Inpatient Medications:     No prescriptions prior to admission    Family History  Problem Relation Age of Onset  . Heart attack Brother      History   Social History  . Marital Status: Married    Spouse Name: N/A    Number of Children: N/A  . Years of Education: N/A   Occupational History  . Not on file.   Social History Main Topics  . Smoking  status: Former Smoker -- 1.0 packs/day for 20 years    Types: Cigarettes  . Smokeless tobacco: Former Neurosurgeon    Quit date: 11/30/2011  . Alcohol Use: No  . Drug Use: No  . Sexually Active: Not on file   Other Topics Concern  . Not on file   Social History Narrative  . No narrative on file     Review of Systems: Limited due to acuity of situation General: positive for diaphoresis  Cardiovascular: positive for chest pain  All other systems reviewed and are otherwise negative except as noted above.  Physical Exam: Temperature 97.4 F (36.3 C), temperature source Oral.    General: Well developed, well nourished, in no acute distress. Head: Normocephalic, atraumatic, sclera non-icteric, no xanthomas, nares are without discharge.  Neck: Negative for carotid bruits. JVD not elevated. Lungs: Distant breath sounds. Centralized rhonchi and trace wheezing appreciated. Breathing supplemented with NRB mask.  Heart: Bradycardic, regular, with S1 S2. No murmurs, rubs, or gallops appreciated. Abdomen: Soft, non-tender, non-distended with normoactive bowel sounds. No hepatomegaly. No rebound/guarding. No obvious abdominal masses. Msk:  Strength and tone appears normal for age. Extremities:  No clubbing, cyanosis or edema.  Distal pedal pulses are 2+ and equal bilaterally. Neuro: Alert and oriented X 2. Moves all extremities spontaneously. Psych:  Responds to questions appropriately with a normal affect.  Labs:  pending  Radiology/Studies:   pending  EKG: NSR, ST elevations II, III, aVF, V3-V6  ASSESSMENT:   1. Inferolateral STEMI 2. Sinus bradycardia  3. Hypotension 4. GERD 5. Recently diagnosed with influenza?  DISCUSSION/PLAN:   Patient currently undergoing emergent cath for STEMI. Will place basic admission orders. Risk stratify with lipid panel and A1C. Will need at least ASA, high-dose statin. Bradycardic and hypotensive on arrival. Hold on BB and antihypertensives. Continue  PPI. Check for influenza. Further recommendations per interventionalist's findings.    Signed, R. Samantha Horn, PA-C 10/29/2012, 10:30 AM   I have personally seen and examined this patient with Samantha Horn, PA-C. I agree with the assessment and plan as outlined above. Emergent cath for inferior STEMI. Further plans to follow.   King,Samantha 5:37 PM 10/29/2012

## 2012-10-29 NOTE — CV Procedure (Signed)
   Cardiac Catheterization Operative Report  Samantha King [id number] 12/20/201311:22 AM No primary provider on file.  Procedure Performed:  1. Left Heart Catheterization 2. Selective Coronary Angiography 3. Left ventricular pressures 4. Temporary Pacemaker 5. PTCA/DES x 1 proximal RCA 6. IABP insertion  Operator: Verne Carrow, MD  Indication: 66 yo WF with history of HTN admitted with inferior STEMI, ventricular fibrillation arrest while in route by EMS. Short period of CPR and shock x 1. Emergent cath for post arrest patient in cardiogenic shock with complete heart block.                              Procedure Details: Emergency consent. To cath lab from ED. Right groin prepped and draped. Pt communicative. Using the modified Seldinger access technique, a 6 French sheath was placed in the right femoral artery. 6 French sheath in right femoral vein. Transvenous pacer into RV. 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 The proximal RCA was totally occluded. The JR4 guide was used for angiography and left in place for PCI. She was given a bolus of Angiomax and a drip was started. She was given double bolus Integrilin and a drip was started. A BMW wire was used to cross the totally occluded lesion in the RCA. A 2.5 x 12 mm balloon was inflated x 1. Flow was restored in the distal vessel. A 3.0 x 16 mm Promus Premier drug eluting stent was placed in the mid RCA. A 3.25 x 12 mm [place] balloon was used to post-dilate the stent. The most distal portion of the small PDA was occluded. A wire was advanced into this occluded vessel and some flow was re-established. At this point, the patient became hypotensive and had another run of ventricular fibrillation. She was shocked x 1 with return of normal rhythm. Pressors instituted during case as noted in procedure log.  A pigtail catheter was used to measure LV pressures. IABP inserted in right femoral  artery. Pt intubated for acidosis, hypoxia.   The patient was taken to the CCU in critical condition.    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 The LAD and Circumflex have minor irregularities.   Right Coronary Artery: Large dominant vessel with 100% occlusion proximally  Left Ventricular Angiogram: Deferred.   Impression: 1. Acute inferior STEMI 2. Occluded proximal RCA now s/p PTCA/DES x 1 3. Cardiogenic shock 4. Complete heart block  Recommendations: Continue IABP at 1:1. CCU. Continue pressors. Continue Integrilin for now. Oral anti-platelet, likely Effient, in am. PCCM consulted.        Complications:  None. The patient tolerated the procedure well.

## 2012-10-29 NOTE — Progress Notes (Signed)
ABG drawn to check results of last ventilator change-critical results were called to [facility] -per DR. Z, Vt > to 700cc with ABG to be redrawn in 1/2 hour, RT to will pass on to nite shift RT

## 2012-10-29 NOTE — ED Notes (Addendum)
Pt. Via EMS from home. EMS called for chest pain. EMS noted ST elevation, Enroute pt  Became Bradycardic, was paced. Pt went into V-Fib, CPR initiated w/ single Defib. Pt given 1 Sub lingual nitro, 6mg  morphine. Pt returned to sinus bradyw/ ST elevation

## 2012-10-29 NOTE — ED Notes (Signed)
Cardiologist at  Bedside. 

## 2012-10-29 NOTE — ED Notes (Signed)
EDP at bedside  

## 2012-10-29 NOTE — Progress Notes (Signed)
Provided spiritual care support to family while pt was in cath lab and acted as liaison until dr was available to provide detailed update of pt. Pt asked for prayer. Follow-up visit was also provided to pt and family.   Marjory Lies Chaplain

## 2012-10-29 NOTE — Consult Note (Signed)
PULMONARY  / CRITICAL CARE MEDICINE  Name: Samantha King MRN: [id number] DOB: [birth year]    LOS: 0  REFERRING MD :  Clifton James  CHIEF COMPLAINT:  Respiratory failure   BRIEF PATIENT DESCRIPTION:  67 year old female admitted on 12/20 after VF arrest in setting of  STEMI. Had < 1 minute of CPR. Was oriented on arrival to ER. Went to cath lab where was found to have occluded RCA. Developed shock and hypoperfusion during case. Intubated. DES placed in RCA. IABP placed, PCCM asked to see.   LINES / TUBES: oett 12/20>>> Right fem venous sheath 12/20>>> Right fem IABP 12/20>>> Left art sheath 12/20>>>  CULTURES: Sputum 12/20>>> Influenza PCR 12/20>>> Urine strep 12/20>>> Urine legionella 12/20>>>  ANTIBIOTICS: tamiflu 12/20>>>  SIGNIFICANT EVENTS:  Cardiac cath 12/20: Occluded proximal RCA now s/p PTCA/DES x 1   LEVEL OF CARE:  ICU  PRIMARY SERVICE:  PCCM CONSULTANTS:  PCCM  CODE STATUS: full  DIET:  NPO  DVT Px:  integrilin  GI Px:  PPI   HISTORY OF PRESENT ILLNESS:    66yo female with no prior cardiac history. She denied past medical history. Does take amlodipine, omeprazole and recently filled a prescription for Tamiflu. History was supplemented by EMS d/t acuity of situation and patient somnolence. The patient called EMS this morning complaining initially of hand pain, which developed into severe chest pain radiating into her left arm and jaw with associated diaphoresis. VSS at that time. ECG revealed inferolateral STEMI. She arrested en route. CPR was initiated for 1 minute. She was defibrillated with successful return of circulation. She was bolused with 500 cc normal saline en route. On ED arrival, repeat EKG confirmed inferolateral STEMI and code STEMI pathway initiated. HR 40s. BP 75/49, RR 17, O2 sat 100% on NRB. She was alert and oriented to person and place. Heparin bolus was given. She was transported emergently to the cath lab. Brother with MI. H/o tobacco abuse- 20  pack-years, quit 12 years ago. Denies prior surgeries. Denies allergies.  Went to cath lab where was found to have occluded RCA. Developed shock and hypoperfusion during case. Intubated. DES placed in RCA. IABP placed, PCCM asked to see.   PAST MEDICAL HISTORY :  Past Medical History  Diagnosis Date  . Hypertension   . MI (myocardial infarction)   . GERD (gastroesophageal reflux disease)    No past surgical history on file. Prior to Admission medications   Medication Sig Start Date End Date Taking? Authorizing Provider  amLODipine (NORVASC) 5 MG tablet Take 5 mg by mouth daily.   Yes Historical Provider, MD  citalopram (CELEXA) 20 MG tablet Take 20 mg by mouth daily.   Yes Historical Provider, MD  fish oil-omega-3 fatty acids 1000 MG capsule Take 2 g by mouth daily.   Yes Historical Provider, MD  [place]is [place]own Oil 500 MG CAPS Take 1 capsule by mouth daily.   Yes Historical Provider, MD  omeprazole (PRILOSEC) 40 MG capsule Take 40 mg by mouth daily.   Yes Historical Provider, MD  oseltamivir (TAMIFLU) 75 MG capsule Take 75 mg by mouth daily. 10/28/12  Yes Historical Provider, MD  POTASSIUM GLUCONATE PO Take 1 tablet by mouth daily. 99mg  tab   Yes Historical Provider, MD  psyllium (REGULOID) 0.52 G capsule Take 0.52 g by mouth daily.   Yes Historical Provider, MD   No Known Allergies  FAMILY HISTORY:  Family History  Problem Relation Age of Onset  . Heart attack Brother  SOCIAL HISTORY:  reports that she has quit smoking. Her smoking use included Cigarettes. She has a 20 pack-year smoking history. She quit smokeless tobacco use about 10 months ago. She reports that she does not drink alcohol or use illicit drugs.  REVIEW OF SYSTEMS:  Unable    INTERVAL HISTORY:  Now in ICU  VITAL SIGNS: Temp:  [97.4 F (36.3 C)] 97.4 F (36.3 C) (12/20 1013) Pulse Rate:  [42-45] 45  (12/20 1012) Resp:  [17-18] 18  (12/20 1012) BP: (75-92)/(49-76) 75/49 mmHg (12/20 1008) SpO2:  [96 %-100 %] 100 %  (12/20 1012) HEMODYNAMICS:   VENTILATOR SETTINGS:   INTAKE / OUTPUT: Intake/Output    None     PHYSICAL EXAMINATION: General:  Acutely ill appearing white female now on full vent support Neuro:  Sedated on vent HEENT:  Orally intubated  Cardiovascular:  Regular rhythm on tele.  Lungs:  Prolonged exp wheeze  Abdomen:  Soft, mottled over abd  Musculoskeletal:  Skin cool and mottled. Weak pulses  Skin:  Cool and mottled    LABS: Cbc  Lab 10/29/12 1030  WBC 15.3*  HGB 13.5  HCT 41.3  PLT 285    Chemistry   Lab 10/29/12 1030  NA 139  K 3.5  CL 103  CO2 17*  BUN 15  CREATININE 0.72  CALCIUM 8.8  MG --  PHOS --  GLUCOSE 249*    Liver fxn  Lab 10/29/12 1030  AST 33  ALT 28  ALKPHOS 86  BILITOT 0.1*  PROT 6.0  ALBUMIN 3.1*   coags  Lab 10/29/12 1030  APTT --  INR 1.03   Sepsis markers No results found for this basename: LATICACIDVEN:3,PROCALCITON:3 in the last 168 hours Cardiac markers  Lab 10/29/12 1030  CKTOTAL 81  CKMB 4.1*  TROPONINI <0.30   BNP No results found for this basename: PROBNP:3 in the last 168 hours ABG No results found for this basename: PHART:3,PCO2ART:3,PO2ART:3,HCO3:3,TCO2:3 in the last 168 hours  CBG trend No results found for this basename: GLUCAP:5 in the last 168 hours  IMAGING:  ECG:  DIAGNOSES: Principal Problem:  *ST elevation myocardial infarction (STEMI) of inferolateral wall Active Problems:  Hypertension  GERD (gastroesophageal reflux disease)   ASSESSMENT / PLAN:  PULMONARY  ASSESSMENT: Acute respiratory failure Mostly in the setting of shock and hypoperfusion.  PLAN:   pcxr s/p intubation Full vent support F/u abg Sputum culture Intermittent sedation protocol   CARDIOVASCULAR  ASSESSMENT:  Acute anterior STEMI  S/p cardiac cath with occluded RCA. Now s/p PTCA/DES X1 on 12/20 Cardiogenic shock CHB Now on multiple pressors and IABP.  PLAN:  Admit to CICU  Cont IABP per cards Wean  pressors for MAP > 65 Anti-platelets per cards   RENAL  ASSESSMENT:  Metabolic acidosis (positive anion gap c/w lactic acidosis) Was placed on bicarb gtt in Cath lab, . Do not have f/u ABG   PLAN:   Checking abg F/u chem w/ serial BMP while on bicarb. Will likely be able to d/c soon.   GASTROINTESTINAL  ASSESSMENT:   No acute issue PLAN:   NPO SUP  HEMATOLOGIC  ASSESSMENT:   No evidence of anemia  PLAN:  Serial CBCs integrillen and effient per cards    INFECTIOUS  ASSESSMENT:   Possible influenza exposure.  Was recently placed on Tamiflu. Currently no records about events leading to this.  PLAN:   Will check influenza PCR Cont tamiflu Sputum culture  CXR PCT algorithm   ENDOCRINE  ASSESSMENT:  Hyperglycemia  PLAN:   cbgs q 4  NEUROLOGIC  ASSESSMENT:   Sedated on vent Was reported as being awake and oriented prior to going to cath lab (this is after her 1 minute reported arrest).  PLAN:   Supportive care Will hold off from hypothermia protocol as was alert and oriented after her arrest. Now sedated on vent.   CLINICAL SUMMARY:  66 year old female now admitted to the ICU s/p STEMI w/ resultant cardiogenic shock. She did have VF arrest but had ROSC in < 1 minute and was alert and oriented following the event. She was intubated due to shock state. She is now s/p stent in RCA and placement of IABP. Will check ABG, provide full supportive care. Hope we can wean her off pressors. Not a candidate for hypothermia protocol given she was awake and alert after her arrest.   I have personally obtained a history, examined the patient, evaluated laboratory and imaging results, formulated the assessment and plan and placed orders. CRITICAL CARE: The patient is critically ill with multiple organ systems failure and requires high complexity decision making for assessment and support, frequent evaluation and titration of therapies, application of advanced monitoring  technologies and extensive interpretation of multiple databases. Critical Care Time devoted to patient care services described in this note is 60 minutes.   Alyson Reedy, M.D. Pulmonary and Critical Care Medicine [facility] Pager: [phone]  10/29/2012, 11:55 AM

## 2012-10-29 NOTE — Progress Notes (Signed)
eLink Physician-Brief Progress Note Patient Name: Samantha King DOB: 1946/01/28 MRN: [id number]  Date of Service  10/29/2012   HPI/Events of Note   Severe dyssynchrony, respiratory acidosis.  eICU Interventions   Placed on CPAP 5 PS 5 --> VT sp > 1000 L.  Placed back on PRVC with VT 700 -->  no dyssynchrony, Ppl = 25.  Will check ABG in 30 minutes.    Intervention Category Intermediate Interventions: Respiratory distress - evaluation and management  Arneta Mahmood 10/29/2012, 7:01 PM

## 2012-10-30 ENCOUNTER — Other Ambulatory Visit: Payer: Self-pay

## 2012-10-30 ENCOUNTER — Encounter (HOSPITAL_COMMUNITY): Payer: Self-pay | Admitting: *Deleted

## 2012-10-30 ENCOUNTER — Inpatient Hospital Stay (HOSPITAL_COMMUNITY): Payer: Medicare Other

## 2012-10-30 DIAGNOSIS — I2119 ST elevation (STEMI) myocardial infarction involving other coronary artery of inferior wall: Secondary | ICD-10-CM

## 2012-10-30 DIAGNOSIS — I469 Cardiac arrest, cause unspecified: Secondary | ICD-10-CM

## 2012-10-30 DIAGNOSIS — I4901 Ventricular fibrillation: Secondary | ICD-10-CM | POA: Diagnosis not present

## 2012-10-30 DIAGNOSIS — R570 Cardiogenic shock: Secondary | ICD-10-CM

## 2012-10-30 DIAGNOSIS — Z4682 Encounter for fitting and adjustment of non-vascular catheter: Secondary | ICD-10-CM | POA: Diagnosis not present

## 2012-10-30 DIAGNOSIS — J9600 Acute respiratory failure, unspecified whether with hypoxia or hypercapnia: Secondary | ICD-10-CM | POA: Diagnosis not present

## 2012-10-30 DIAGNOSIS — J95821 Acute postprocedural respiratory failure: Secondary | ICD-10-CM | POA: Diagnosis not present

## 2012-10-30 DIAGNOSIS — J811 Chronic pulmonary edema: Secondary | ICD-10-CM | POA: Diagnosis not present

## 2012-10-30 LAB — POCT I-STAT 3, ART BLOOD GAS (G3+)
Bicarbonate: 20.3 mEq/L (ref 20.0–24.0)
Patient temperature: 38.9
TCO2: 21 mmol/L (ref 0–100)
pH, Arterial: 7.471 — ABNORMAL HIGH (ref 7.350–7.450)
pO2, Arterial: 156 mmHg — ABNORMAL HIGH (ref 80.0–100.0)

## 2012-10-30 LAB — BASIC METABOLIC PANEL
BUN: 13 mg/dL (ref 6–23)
CO2: 21 mEq/L (ref 19–32)
Calcium: 7.9 mg/dL — ABNORMAL LOW (ref 8.4–10.5)
Calcium: 8.3 mg/dL — ABNORMAL LOW (ref 8.4–10.5)
GFR calc Af Amer: >90 mL/min (ref >90)
GFR calc non Af Amer: >90 mL/min (ref >90)
GFR calc non Af Amer: >90 mL/min (ref >90)
Glucose, Bld: 116 mg/dL — ABNORMAL HIGH (ref 70–99)
Sodium: 140 mEq/L (ref 135–145)
Sodium: 141 mEq/L (ref 135–145)

## 2012-10-30 LAB — COMPREHENSIVE METABOLIC PANEL
ALT: 88 U/L — ABNORMAL HIGH (ref 0–35)
AST: 355 U/L — ABNORMAL HIGH (ref 0–37)
Albumin: 2.8 g/dL — ABNORMAL LOW (ref 3.5–5.2)
CO2: 22 mEq/L (ref 19–32)
Calcium: 8.2 mg/dL — ABNORMAL LOW (ref 8.4–10.5)
Creatinine, Ser: 0.62 mg/dL (ref 0.50–1.10)
Sodium: 141 mEq/L (ref 135–145)
Total Protein: 5.3 g/dL — ABNORMAL LOW (ref 6.0–8.3)

## 2012-10-30 LAB — BLOOD GAS, ARTERIAL
Acid-base deficit: 1.9 mmol/L (ref 0.0–2.0)
Drawn by: 331001
O2 Saturation: 99 %
pCO2 arterial: 33.3 mmHg — ABNORMAL LOW (ref 35.0–45.0)

## 2012-10-30 LAB — LACTIC ACID, PLASMA: Lactic Acid, Venous: 1.6 mmol/L (ref 0.5–2.2)

## 2012-10-30 LAB — CBC
Hemoglobin: 11.8 g/dL — ABNORMAL LOW (ref 12.0–15.0)
MCH: 30.9 pg (ref 26.0–34.0)
MCHC: 33.2 g/dL (ref 30.0–36.0)
Platelets: 271 10*3/uL (ref 150–400)
RDW: 14.7 % (ref 11.5–15.5)

## 2012-10-30 LAB — HEPARIN LEVEL (UNFRACTIONATED): Heparin Unfractionated: 0.21 IU/mL — ABNORMAL LOW (ref 0.30–0.70)

## 2012-10-30 LAB — CARBOXYHEMOGLOBIN
Carboxyhemoglobin: 0.7 % (ref 0.5–1.5)
Carboxyhemoglobin: 0.9 % (ref 0.5–1.5)
Methemoglobin: 1.4 % (ref 0.0–1.5)
O2 Saturation: 68.3 %
O2 Saturation: 78.8 %
Total hemoglobin: 10.9 g/dL — ABNORMAL LOW (ref 12.0–16.0)

## 2012-10-30 LAB — PROCALCITONIN: Procalcitonin: 1.19 ng/mL

## 2012-10-30 LAB — GLUCOSE, CAPILLARY
Glucose-Capillary: 107 mg/dL — ABNORMAL HIGH (ref 70–99)
Glucose-Capillary: 114 mg/dL — ABNORMAL HIGH (ref 70–99)
Glucose-Capillary: 122 mg/dL — ABNORMAL HIGH (ref 70–99)
Glucose-Capillary: 124 mg/dL — ABNORMAL HIGH (ref 70–99)

## 2012-10-30 IMAGING — CR DG CHEST 1V PORT
1 series · 1 of 1 positions shown · non-contrast
Comparison: Portable chest x-ray of [DATE]

CLINICAL DATA: Intubation, follow-up

PORTABLE CHEST - 1 VIEW

[AP]
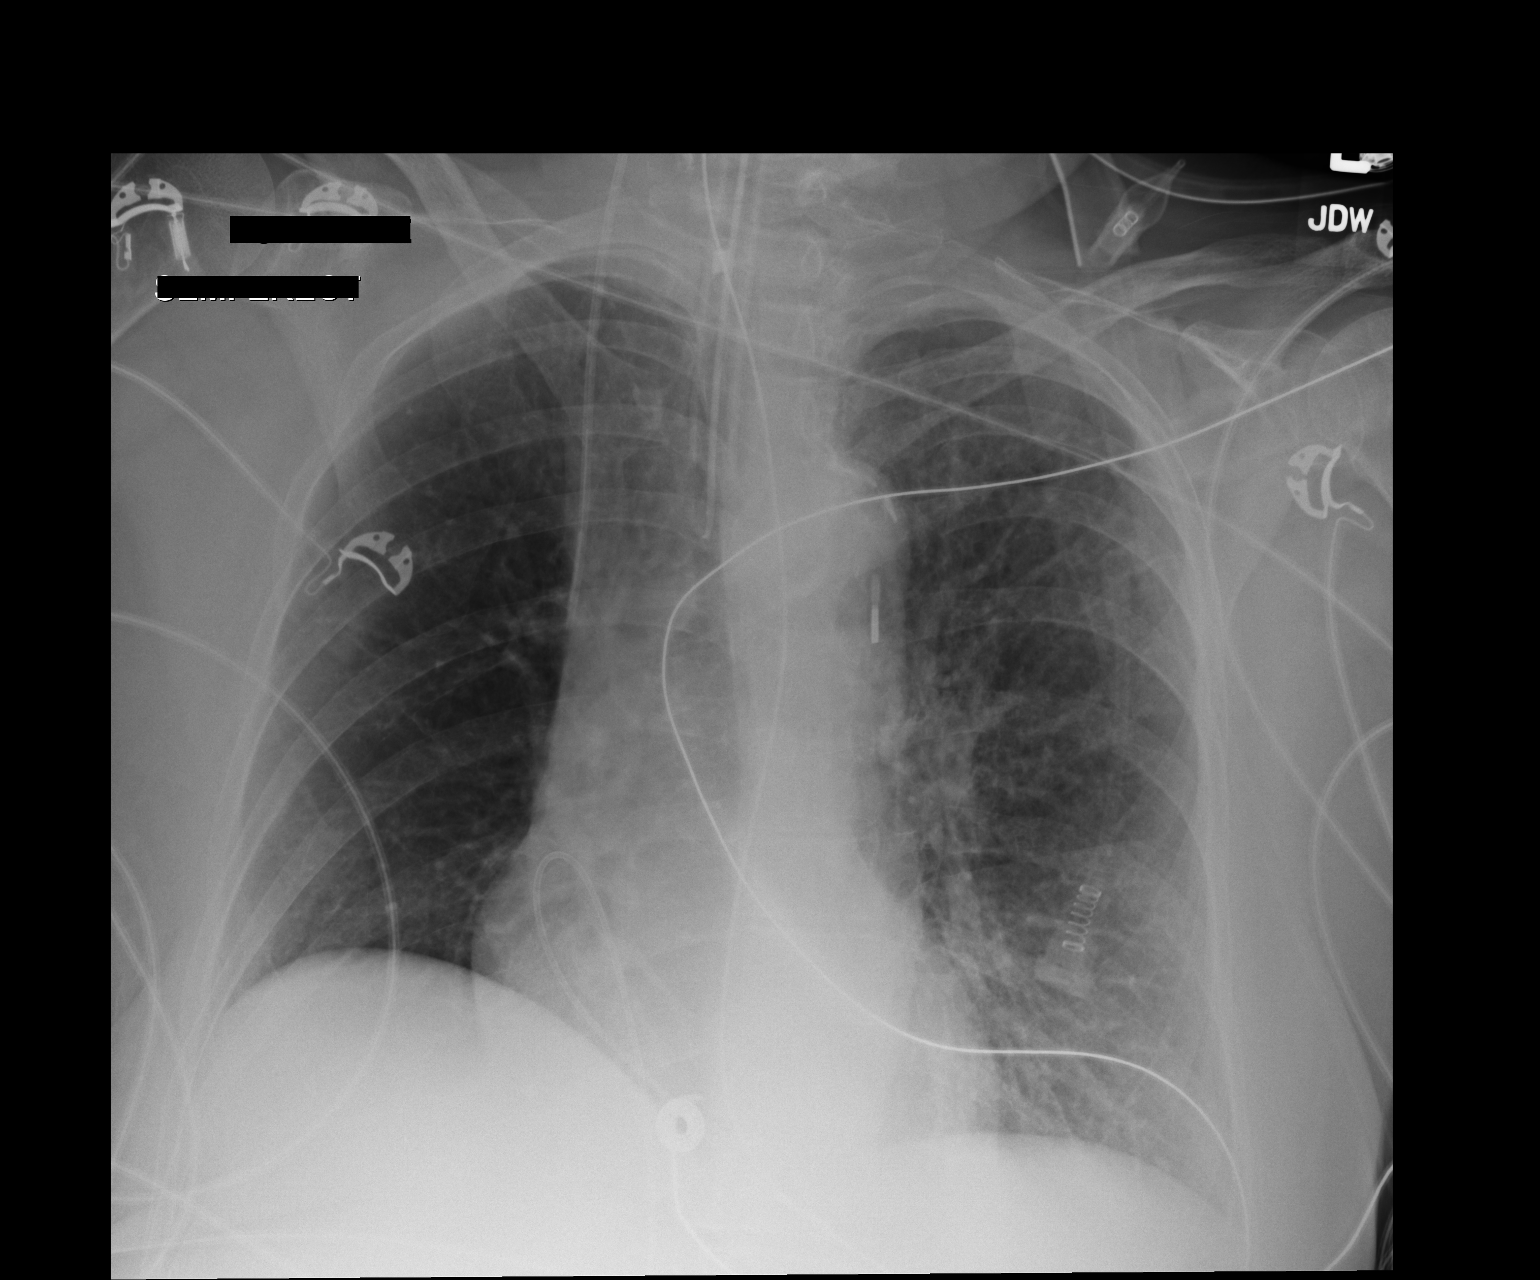

[1 of 1 positions shown; findings below may reference images not displayed]

FINDINGS: The carina is difficult to visualize on this rotated
patient, but the tip of endotracheal tube appears to be
approximately 3.7 cm above the carina.  There is minimal pulmonary
vascular congestion present.  Right central venous line tip
overlies the mid upper SVC.  No pneumothorax is seen.  The aortic
balloon tip overlies the lower aortic knob.
IMPRESSION: Little change in aeration with mild pulmonary vascular congestion.

## 2012-10-30 MED ORDER — INSULIN ASPART 100 UNIT/ML ~~LOC~~ SOLN
2.0000 [IU] | SUBCUTANEOUS | Status: DC
  Administered 2012-10-30: 2 [IU] via SUBCUTANEOUS
  Administered 2012-10-31 (×2): 4 [IU] via SUBCUTANEOUS
  Administered 2012-10-31 – 2012-11-01 (×3): 2 [IU] via SUBCUTANEOUS
  Administered 2012-11-01 (×4): 4 [IU] via SUBCUTANEOUS
  Administered 2012-11-01: 2 [IU] via SUBCUTANEOUS
  Administered 2012-11-02: 4 [IU] via SUBCUTANEOUS
  Administered 2012-11-02 – 2012-11-03 (×5): 2 [IU] via SUBCUTANEOUS

## 2012-10-30 MED ORDER — SODIUM CHLORIDE 0.9 % IV SOLN
1.5000 g | Freq: Three times a day (TID) | INTRAVENOUS | Status: DC
  Administered 2012-10-30 – 2012-11-06 (×22): 1.5 g via INTRAVENOUS
  Filled 2012-10-30 (×26): qty 1.5

## 2012-10-30 MED ORDER — HEPARIN (PORCINE) IN NACL 100-0.45 UNIT/ML-% IJ SOLN
700.0000 [IU]/h | INTRAMUSCULAR | Status: DC
  Administered 2012-10-30: 700 [IU]/h via INTRAVENOUS
  Filled 2012-10-30 (×2): qty 250

## 2012-10-30 MED ORDER — ACETAMINOPHEN 160 MG/5ML PO SOLN
650.0000 mg | Freq: Four times a day (QID) | ORAL | Status: DC | PRN
  Administered 2012-10-30 (×2): 650 mg
  Filled 2012-10-30 (×2): qty 20.3

## 2012-10-30 MED ORDER — ACETAMINOPHEN 325 MG PO TABS
650.0000 mg | ORAL_TABLET | Freq: Four times a day (QID) | ORAL | Status: DC | PRN
  Administered 2012-10-30 – 2012-11-09 (×9): 650 mg
  Filled 2012-10-30 (×11): qty 2

## 2012-10-30 MED ORDER — INSULIN GLARGINE 100 UNIT/ML ~~LOC~~ SOLN
10.0000 [IU] | SUBCUTANEOUS | Status: DC
  Administered 2012-10-30 – 2012-11-03 (×5): 10 [IU] via SUBCUTANEOUS

## 2012-10-30 MED ORDER — SODIUM CHLORIDE 0.9 % IV SOLN
INTRAVENOUS | Status: DC
  Administered 2012-10-30: 20 mL/h via INTRAVENOUS
  Administered 2012-11-01 – 2012-11-03 (×2): via INTRAVENOUS

## 2012-10-30 MED ORDER — POTASSIUM CHLORIDE 20 MEQ/15ML (10%) PO LIQD
ORAL | Status: AC
  Administered 2012-10-30: 40 meq
  Filled 2012-10-30: qty 30

## 2012-10-30 MED ORDER — ACETAMINOPHEN 325 MG PO TABS: 650.0000 mg | ORAL_TABLET | Freq: Four times a day (QID) | ORAL | Status: DC | PRN

## 2012-10-30 MED ORDER — OSELTAMIVIR PHOSPHATE 75 MG PO CAPS
75.0000 mg | ORAL_CAPSULE | Freq: Two times a day (BID) | ORAL | Status: DC
  Administered 2012-10-30 – 2012-11-06 (×15): 75 mg via ORAL
  Filled 2012-10-30 (×18): qty 1

## 2012-10-30 MED ORDER — DOPAMINE-DEXTROSE 3.2-5 MG/ML-% IV SOLN
2.0000 ug/kg/min | INTRAVENOUS | Status: DC
  Administered 2012-10-30: 4 ug/kg/min via INTRAVENOUS
  Filled 2012-10-30: qty 250

## 2012-10-30 MED ORDER — AMIODARONE HCL IN DEXTROSE 360-4.14 MG/200ML-% IV SOLN
INTRAVENOUS | Status: AC
  Filled 2012-10-30: qty 200

## 2012-10-30 MED ORDER — AMIODARONE HCL IN DEXTROSE 360-4.14 MG/200ML-% IV SOLN
30.0000 mg/h | INTRAVENOUS | Status: DC
  Administered 2012-10-30 – 2012-11-05 (×14): 30 mg/h via INTRAVENOUS
  Filled 2012-10-30 (×29): qty 200

## 2012-10-30 MED ORDER — POTASSIUM CHLORIDE 20 MEQ/15ML (10%) PO LIQD
40.0000 meq | Freq: Once | ORAL | Status: AC
  Administered 2012-10-30: 40 meq
  Filled 2012-10-30: qty 30

## 2012-10-30 MED ORDER — FUROSEMIDE 10 MG/ML IJ SOLN: 20.0000 mg | Freq: Once | INTRAMUSCULAR | Status: DC

## 2012-10-30 MED ORDER — AMIODARONE LOAD VIA INFUSION
150.0000 mg | Freq: Once | INTRAVENOUS | Status: AC
  Administered 2012-10-30: 150 mg via INTRAVENOUS
  Filled 2012-10-30: qty 83.34

## 2012-10-30 MED ORDER — DEXTROSE 10 % IV SOLN: INTRAVENOUS | Status: DC | PRN

## 2012-10-30 NOTE — Progress Notes (Signed)
Wedding ring removed per request of son.  One white metal ring with 3 white stones given to husband.  Teodoro Spray, RN and S. Previtte, RN at bedside when husband rec'd ring.

## 2012-10-30 NOTE — Consult Note (Signed)
PULMONARY  / CRITICAL CARE MEDICINE  Name: Samantha King MRN: [id number] DOB: 12/02/1945    LOS: 1  REFERRING MD :  Clifton James  CHIEF COMPLAINT:  Respiratory failure   BRIEF PATIENT DESCRIPTION:  66 year old female smoker admitted on 12/20 after VF arrest in setting of  STEMI. Had < 1 minute of CPR. Was oriented on arrival to ER. Went to cath lab where was found to have occluded RCA. Developed shock and hypoperfusion during case. Intubated. DES placed in RCA. IABP placed, PCCM asked to see.   LINES / TUBES: oett 12/20>>> Right fem venous sheath 12/20>>> Right fem IABP 12/20>>> Left art sheath 12/20>>>  CULTURES: Sputum 12/20>>> Influenza PCR 12/20>>> Urine strep 12/20>>> Urine legionella 12/20>>>  ANTIBIOTICS: tamiflu 12/20>>>  SIGNIFICANT EVENTS:  Cardiac cath 12/20: Occluded proximal RCA now s/p PTCA/DES x 1   LEVEL OF CARE:  ICU  PRIMARY SERVICE:  PCCM CONSULTANTS:  PCCM  CODE STATUS: full  DIET:  NPO  DVT Px:  integrilin  GI Px:  PPI    INTERVAL HISTORY:   Tv increased overnight for resp acidosis deneis pain Awake on drips  VITAL SIGNS: Temp:  [94.1 F (34.5 C)-100.9 F (38.3 C)] 100.2 F (37.9 C) (12/21 0800) Pulse Rate:  [28-106] 78  (12/21 0800) Resp:  [12-24] 24  (12/21 0800) BP: (75-126)/(45-111) 93/60 mmHg (12/21 0700) SpO2:  [72 %-100 %] 100 % (12/21 0800) Arterial Line BP: (92-155)/(54-117) 111/59 mmHg (12/21 0800) FiO2 (%):  [50 %-70 %] 50 % (12/21 0700) Weight:  [79.1 kg (174 lb 6.1 oz)-82.6 kg (182 lb 1.6 oz)] 82.6 kg (182 lb 1.6 oz) (12/21 0447) HEMODYNAMICS: CVP:  [9 mmHg-23 mmHg] 15 mmHg VENTILATOR SETTINGS: Vent Mode:  [-] PRVC FiO2 (%):  [50 %-70 %] 50 % Set Rate:  [15 bmp-24 bmp] 24 bmp Vt Set:  [400 mL-700 mL] 700 mL PEEP:  [5 cmH20] 5 cmH20 Pressure Support:  [10 cmH20] 10 cmH20 Plateau Pressure:  [18 cmH20-26 cmH20] 22 cmH20 INTAKE / OUTPUT: Intake/Output      12/20 0701 - 12/21 0700 12/21 0701 - 12/22 0700   I.V.  (mL/kg) 3760.1 (45.5) 185.2 (2.2)   NG/GT 80    IV Piggyback 100    Total Intake(mL/kg) 3940.1 (47.7) 185.2 (2.2)   Urine (mL/kg/hr) 925 (0.5) 40   Total Output 925 40   Net +3015.1 +145.2          PHYSICAL EXAMINATION: General:  Acutely ill appearing white female  Neuro:  Non focal HEENT:  Orally intubated  Cardiovascular:  Regular rhythm on tele.  Lungs:  Prolonged exp wheeze , autoPEEP+ Abdomen:  Soft, mottled over abd  Musculoskeletal:  Skin cool and mottled. Weak pulses  Skin:  Cool and mottled    LABS: Cbc  Lab 10/30/12 0410 10/29/12 2150 10/29/12 1215  WBC 18.5* -- --  HGB 11.8* 12.6 13.3  HCT 35.5* 38.0 39.6  PLT 271 286 305    Chemistry   Lab 10/30/12 0410 10/29/12 2150 10/29/12 1330 10/29/12 1215  NA 141 142 -- 140  K 3.6 3.5 -- 3.3*  CL 107 107 -- 103  CO2 22 23 -- 22  BUN 16 17 -- 16  CREATININE 0.62 0.75 -- 0.60  CALCIUM 8.2* 8.2* -- 7.8*  MG -- -- 1.6 1.5  PHOS -- -- -- --  GLUCOSE 115* 121* -- 403*    Liver fxn  Lab 10/30/12 0410 10/29/12 1215 10/29/12 1030  AST 355* -- 33  ALT 88* --  28  ALKPHOS 76 89 86  BILITOT 0.2* -- 0.1*  PROT 5.3* -- 6.0  ALBUMIN 2.8* -- 3.1*   coags  Lab 10/29/12 1030  APTT --  INR 1.03   Sepsis markers  Lab 10/30/12 0420 10/29/12 1300 10/29/12 1231  LATICACIDVEN 1.6 3.0* --  PROCALCITON -- -- <0.10   Cardiac markers  Lab 10/29/12 1855 10/29/12 1330 10/29/12 1030  CKTOTAL -- -- 81  CKMB -- -- 4.1*  TROPONINI >20.00* >20.00* <0.30   BNP  Lab 10/30/12 0410  PROBNP 1268.0*   ABG  Lab 10/30/12 0831 10/29/12 2008 10/29/12 1846  PHART 7.471* 7.282* 7.146*  PCO2ART 28.3* 44.1 70.1*  PO2ART 156.0* 121.0* 85.0  HCO3 20.3 20.8 24.5*  TCO2 21 22 27     CBG trend  Lab 10/30/12 0755 10/30/12 0405 10/30/12 0259 10/30/12 0204 10/30/12 0104  GLUCAP 105* 139* 116* 107* 119*    IMAGING: lt sided infx    DIAGNOSES: Active Problems:  Hypertension  GERD (gastroesophageal reflux disease)  Acute  respiratory failure  Cardiogenic shock  Cardiac arrest  ST elevation myocardial infarction (STEMI) of inferior wall   ASSESSMENT / PLAN:  PULMONARY  ASSESSMENT: Acute respiratory failure Mostly in the setting of shock and hypoperfusion.  PLAN:   Full vent support Lower RR for autoPEEP, lower Tv to 650 Intermittent sedation protocol   CARDIOVASCULAR  ASSESSMENT:  Acute anterior STEMI  S/p cardiac cath with occluded RCA. s/p PTCA/DES X1 on 12/20 Cardiogenic shock CHB on multiple pressors and IABP.  PLAN:  Admit to CICU  Wean IABP per cards Wean pressors for MAP > 65 Anti-platelets per cards   RENAL  ASSESSMENT:  Metabolic acidosis (positive anion gap c/w lactic acidosis)  PLAN:  Dc bicarb gtt    GASTROINTESTINAL  ASSESSMENT:   No acute issue PLAN:   Start TFs in 24h if not extubated SUP  HEMATOLOGIC  ASSESSMENT:   No evidence of anemia  PLAN:  Serial CBCs integrillen and effient per cards    INFECTIOUS  ASSESSMENT:   H1N1 POS.  - on tamiflu  PLAN:   Cont tamiflu Add empiric unasyn    ENDOCRINE  ASSESSMENT:   Hyperglycemia  PLAN:   cbgs q 4  NEUROLOGIC  ASSESSMENT:  pain   PLAN:   Cont sedation -minimise versed, ok to use fent gtt  hypothermia protocol deferred  GLOBAL - updated sons at bedside & d/w Cards  I have personally obtained a history, examined the patient, evaluated laboratory and imaging results, formulated the assessment and plan and placed orders. CRITICAL CARE: The patient is critically ill with multiple organ systems failure and requires high complexity decision making for assessment and support, frequent evaluation and titration of therapies, application of advanced monitoring technologies and extensive interpretation of multiple databases. Critical Care Time devoted to patient care services described in this note is 35 minutes.   Cyril Mourning MD. Tonny Bollman.  Pulmonary & Critical care Pager [phone] If no  response call [phone]    10/30/2012, 8:40 AM

## 2012-10-30 NOTE — Progress Notes (Signed)
eLink Physician-Brief Progress Note Patient Name: Samantha King DOB: August 18, 1946 MRN: [id number]  Date of Service  10/30/2012   HPI/Events of Note  Runs of VT   eICU Interventions  Amio bolus & restart Stat lytes    Intervention Category Major Interventions: Arrhythmia - evaluation and management  Nayla Dias V. 10/30/2012, 9:56 PM

## 2012-10-30 NOTE — Progress Notes (Signed)
ANTICOAGULATION CONSULT NOTE - Follow Up Consult  Pharmacy Consult for heparin Indication: Anticoagulation with IABP in place  No Known Allergies  Patient Measurements: Height: 5\' 2"  (157.5 cm) Weight: 182 lb 1.6 oz (82.6 kg) IBW/kg (Calculated) : 50.1  Heparin Dosing Weight: 68.6  Vital Signs: Temp: 100.2 F (37.9 C) (12/21 1700) Temp src: Core (Comment) (12/21 0800) BP: 92/77 mmHg (12/21 1600) Pulse Rate: 36  (12/21 1700)  Labs:  Basename 10/30/12 1643 10/30/12 1600 10/30/12 1530 10/30/12 0910 10/30/12 0410 10/29/12 2150 10/29/12 1855 10/29/12 1215 10/29/12 1030  HGB -- -- -- -- 11.8* 12.6 -- -- --  HCT -- -- -- -- 35.5* 38.0 -- 39.6 --  PLT -- -- -- -- 271 286 -- 305 --  APTT -- -- -- -- -- -- -- -- --  LABPROT -- -- -- -- -- -- -- -- 13.4  INR -- -- -- -- -- -- -- -- 1.03  HEPARINUNFRC 0.21* -- -- -- -- -- -- -- --  CREATININE -- 0.54 -- -- 0.62 0.75 -- -- --  CKTOTAL -- -- -- -- -- -- -- -- 81  CKMB -- -- -- -- -- -- -- -- 4.1*  TROPONINI -- -- >20.00* >20.00* -- -- >20.00* -- --    Estimated Creatinine Clearance: 67 ml/min (by C-G formula based on Cr of 0.54).   Medications:  Scheduled:    . albuterol  4 puff Inhalation Q4H  . ampicillin-sulbactam (UNASYN) IV  1.5 g Intravenous Q8H  . antiseptic oral rinse  15 mL Mouth Rinse QID  . aspirin  81 mg Per NG tube Daily  . atorvastatin  80 mg Per NG tube q1800  . chlorhexidine  15 mL Mouth Rinse BID  . furosemide  20 mg Intravenous Once  . insulin aspart  2-6 Units Subcutaneous Q4H  . insulin glargine  10 Units Subcutaneous Q24H  . oseltamivir  75 mg Oral BID  . pantoprazole (PROTONIX) IV  40 mg Intravenous Daily  . [COMPLETED] potassium chloride  10 mEq Intravenous Q1 Hr x 2  . [COMPLETED] potassium chloride  40 mEq Per Tube Once  . prasugrel  10 mg Per NG tube Daily  . sodium chloride  3 mL Intravenous Q12H  . [DISCONTINUED] oseltamivir  75 mg Oral Daily   Infusions:    . sodium chloride 20 mL/hr  (10/30/12 1009)  . dextrose    . DOPamine 3 mcg/kg/min (10/30/12 1700)  . [EXPIRED] eptifibatide 2 mcg/kg/min (10/29/12 2257)  . fentaNYL infusion INTRAVENOUS 200 mcg/hr (10/30/12 1324)  . heparin 700 Units/hr (10/30/12 1005)  . midazolam (VERSED) infusion 3 mg/hr (10/30/12 1500)  . norepinephrine (LEVOPHED) Adult infusion 14 mcg/min (10/30/12 1546)  . [DISCONTINUED] sodium chloride 125 mL/hr (10/30/12 0213)  . [DISCONTINUED] amiodarone (NEXTERONE PREMIX) 360 mg/200 mL dextrose 30 mg/hr (10/30/12 0156)  . [DISCONTINUED] DOPamine 5 mcg/kg/min (10/29/12 1821)  . [DISCONTINUED] insulin (NOVOLIN-R) infusion Stopped (10/29/12 2100)    Assessment: 66 y.o. F who presented to [facility] on 12/20 with STEMI, s/p cardiac arrest and now s/p cath. Patient on heparin with IABP and heparin level is at goal (HL=0.21).  Goal of Therapy:  Heparin level 0.2-0.5 (with IABP in place)  Monitor platelets by anticoagulation protocol: Yes   Plan:   -Continue heparin at 700 units/hr -Heparin level and CBC daily  Harland German, Pharm D 10/30/2012 5:54 PM

## 2012-10-30 NOTE — Progress Notes (Signed)
Patient having runs of V-Tach-rate-rate 214.  Dr. Vassie Loll notified of all parameters.  Amiodarone resumed at 30mg /hr with 150mg  bolus IV.  No further ectopy after Amiodarone resumed.  Continue to monitor.  Patient connected to Zoll monitor, defib pads in place.

## 2012-10-30 NOTE — Progress Notes (Signed)
  Echocardiogram 2D Echocardiogram has been performed.  Samantha King FRANCES 10/30/2012, 12:09 PM

## 2012-10-30 NOTE — Progress Notes (Signed)
ANTICOAGULATION CONSULT NOTE - Initial Consult  Pharmacy Consult for Heparin Indication: Anticoagulation with IABP in place  No Known Allergies  Patient Measurements: Height: 5\' 2"  (157.5 cm) Weight: 182 lb 1.6 oz (82.6 kg) IBW/kg (Calculated) : 50.1  Heparin Dosing Weight: 68.6 kg  Vital Signs: Temp: 100.4 F (38 C) (12/21 0842) BP: 93/60 mmHg (12/21 0700) Pulse Rate: 79  (12/21 0842)  Labs:  Basename 10/30/12 0410 10/29/12 2150 10/29/12 1855 10/29/12 1330 10/29/12 1215 10/29/12 1030  HGB 11.8* 12.6 -- -- -- --  HCT 35.5* 38.0 -- -- 39.6 --  PLT 271 286 -- -- 305 --  APTT -- -- -- -- -- --  LABPROT -- -- -- -- -- 13.4  INR -- -- -- -- -- 1.03  HEPARINUNFRC -- -- -- -- -- --  CREATININE 0.62 0.75 -- -- 0.60 --  CKTOTAL -- -- -- -- -- 81  CKMB -- -- -- -- -- 4.1*  TROPONINI -- -- >20.00* >20.00* -- <0.30    Estimated Creatinine Clearance: 67 ml/min (by C-G formula based on Cr of 0.62).   Medical History: Past Medical History  Diagnosis Date  . Hypertension   . MI (myocardial infarction)   . GERD (gastroesophageal reflux disease)     Assessment: 66 y.o. F who presented to [facility] on 12/20 with STEMI, s/p cardiac arrest en route requiring CPR. Emergent cath revealed occluded proximal RCA now s/p PTCA/DES x 1 with IABP in place. Integrilin was initiated by Dr. Clifton James yesterday with plans to continue for ~18-20 hours post cath and then transition to heparin if IABP still in place. This morning, Integrilin has been d/ced and pharmacy has been consulted to start heparin for anticoagulation with the IABP. Hep Wt: 68.6 kg. Pt noted to have some oozing of blood from her mouth/ET tube overnight. Hgb/Hct slight drop, plts wnl. Will not bolus and aim for lower goal of 0.2-0.5.   Goal of Therapy:  Heparin level 0.2-0.5 (with IABP in place) Monitor platelets by anticoagulation protocol: Yes   Plan:  1. Initiate heparin at a drip rate of 700 units/hr (7 ml/hr) 2. Daily heparin  levels, CBC 3. Will continue to monitor for any signs/symptoms of bleeding and will follow up with heparin level in 6 hours   Georgina Pillion, PharmD, BCPS Clinical Pharmacist Pager: [phone] 10/30/2012 9:11 AM

## 2012-10-30 NOTE — Progress Notes (Signed)
Dr Gala Romney notified of low bp on IABP 1-3, orders received Do not stop heparin, do not dc IABP  IABP 1-1 until 6am 22 Dec 13 then 1-2 x one hour then 1-3 until Dr Gala Romney rounds

## 2012-10-30 NOTE — Progress Notes (Addendum)
Subjective:   Samantha King is a 66 yo female smoker with no prior cardiac history. Admitted 12/20 with inferior STEMI/VF arrest in setting of influenza. Treated with PCI/DES to RCA. 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 Now intubated on IABP.  Remains on vent - multiple recent adjustments for acidosis. On levophed and dopamine. Renal function stable.   IABP MAP 80-90s.   Arousable. Follows commands. Making good urine. Remains on levophed/dopamine. BP stabilizing. CVP 14-15.    Intake/Output Summary (Last 24 hours) at 10/30/12 0819 Last data filed at 10/30/12 0800  Gross per 24 hour  Intake 4125.28 ml  Output    965 ml  Net 3160.28 ml    Current meds:    . albuterol  4 puff Inhalation Q4H  . antiseptic oral rinse  15 mL Mouth Rinse QID  . aspirin  81 mg Per NG tube Daily  . atorvastatin  80 mg Per NG tube q1800  . chlorhexidine  15 mL Mouth Rinse BID  . insulin aspart  2-6 Units Subcutaneous Q4H  . insulin glargine  10 Units Subcutaneous Q24H  . oseltamivir  75 mg Oral Daily  . pantoprazole (PROTONIX) IV  40 mg Intravenous Daily  . prasugrel  10 mg Per NG tube Daily  . sodium chloride  3 mL Intravenous Q12H   Infusions:    . sodium chloride 125 mL/hr (10/30/12 0213)  . amiodarone (NEXTERONE PREMIX) 360 mg/200 mL dextrose 30 mg/hr (10/30/12 0156)  . dextrose    . DOPamine 5 mcg/kg/min (10/29/12 1821)  . fentaNYL infusion INTRAVENOUS 200 mcg/hr (10/30/12 0026)  . midazolam (VERSED) infusion 3 mg/hr (10/29/12 2221)  . norepinephrine (LEVOPHED) Adult infusion 14 mcg/min (10/30/12 0700)     Objective:  Blood pressure 93/60, pulse 78, temperature 100.2 F (37.9 C), temperature source Oral, resp. rate 24, height 5\' 2"  (1.575 m), weight 82.6 kg (182 lb 1.6 oz), SpO2 100.00%. Weight change:   Physical Exam: General:  Intubated. Sedated. Critically ill. Arouses to voice and follows commands. HEENT:  normal Neck: supple. JVP up. Carotids 2+ bilat; no bruits. No lymphadenopathy or thryomegaly appreciated. Cor: PMI nondisplaced. Regular rate & rhythm. No rubs, gallops or murmurs. Lungs: clear Abdomen: soft, nontender, nondistended. Hypoactive bowel sounds. Extremities: no cyanosis, clubbing, rash, edema. IABP in place. Sheaths in place. Neuro: alert & orientedx3, cranial nerves grossly intact. moves all 4 extremities w/o difficulty. Affect pleasant  Telemetry: SR  Lab Results: Basic Metabolic Panel:  Lab 10/30/12 4540 10/29/12 2150 10/29/12 1330 10/29/12 1215 10/29/12 1046 10/29/12 1030  NA 141 142 -- 140 128* 139  K 3.6 3.5 -- -- -- --  CL 107 107 -- 103 97 103  CO2 22 23 -- 22 -- 17*  GLUCOSE 115* 121* -- 403* 240* 249*  BUN 16 17 -- 16 15 15   CREATININE 0.62 0.75 -- 0.60 0.60 0.72  CALCIUM 8.2* 8.2* -- 7.8* -- 8.8  MG -- -- 1.6 1.5 -- --  PHOS -- -- -- -- -- --   Liver Function Tests:  Lab 10/30/12 0410 10/29/12 1215 10/29/12 1030  AST 355* -- 33  ALT 88* -- 28  ALKPHOS 76 89 86  BILITOT 0.2* -- 0.1*  PROT 5.3* -- 6.0  ALBUMIN 2.8* -- 3.1*   No results found for this basename: LIPASE:5,AMYLASE:5 in the last 168 hours No results found for this basename: AMMONIA:5 in the last 168 hours CBC:  Lab 10/30/12 0410 10/29/12 2150 10/29/12 1215  10/29/12 1046 10/29/12 1030  WBC 18.5* 22.5* 25.4* -- 15.3*  NEUTROABS -- -- -- -- 10.7*  HGB 11.8* 12.6 13.3 12.9 13.5  HCT 35.5* 38.0 39.6 38.0 41.3  MCV 92.9 93.8 92.5 -- 96.5  PLT 271 286 305 -- 285   Cardiac Enzymes:  Lab 10/29/12 1855 10/29/12 1330 10/29/12 1030  CKTOTAL -- -- 81  CKMB -- -- 4.1*  CKMBINDEX -- -- --  TROPONINI >20.00* >20.00* <0.30   BNP: No components found with this basename: POCBNP:5 CBG:  Lab 10/30/12 0405 10/30/12 0259 10/30/12 0204 10/30/12 0104 10/29/12 2352  GLUCAP 139* 116* 107* 119* 124*   Microbiology: Lab Results  Component Value Date   CULT PENDING 10/29/2012    Lab 10/29/12  1315  CULT PENDING  SDES TRACHEAL ASPIRATE    Imaging: Portable Chest Xray In Am  10/30/2012  *RADIOLOGY REPORT*  Clinical Data: Intubation, follow-up  PORTABLE CHEST - 1 VIEW  Comparison: Portable chest x-ray of 10/29/2012  Findings: The carina is difficult to visualize on this rotated patient, but the tip of endotracheal tube appears to be approximately 3.7 cm above the carina.  There is minimal pulmonary vascular congestion present.  Right central venous line tip overlies the mid upper SVC.  No pneumothorax is seen.  The aortic balloon tip overlies the lower aortic knob.  IMPRESSION: Little change in aeration with mild pulmonary vascular congestion.   Original Report Authenticated By: Dwyane Dee, M.D.    Dg Chest Port 1 View  10/29/2012  *RADIOLOGY REPORT*  Clinical Data: Respiratory difficulty  PORTABLE CHEST - 1 VIEW  Comparison: None.  Findings: Endotracheal tube is 3.9 cm from the carina.  NG tube is beyond the gastroesophageal junction.  Transvenous pacemaker from the IVC in place with its tip projecting over the tip of the right ventricle.  Right internal jugular vein center venous catheter tip in the mid SVC.  Intra-aortic balloon pump tip in the proximal descending aorta.  Low lung volumes.  Normal heart size.  No pneumothorax.  Minimal patchy density at the lung bases.  IMPRESSION: Tubular structures, transvenous pacer and intra-aortic balloon pump appropriately positioned.  Bibasilar atelectasis verses airspace disease.   Original Report Authenticated By: Jolaine Click, M.D.      ASSESSMENT:  1. Inferior STEMI - s/p PCI/DES to RCA on 12/20 2. VF arrest 3. Cardiogenic shock 4. VDRF 5. Influenza 6. Severe metabolic acidosis - improving 7. Hypokalemia 8. COPD  PLAN/DISCUSSION:  Improving steadily. Will attempt to start weaning pressors - first dopa then levophed. Keep MAR >= 65. Once pressors down can get IABP out either later today or tomorrow. Give 1 dose IV lasix. Follow CVPs  and co-ox.  Integrilin just stopped. Will start IV heparin for IABP. Continue Effient. Can stop amiodarone.  I removed TVP this am. No b-blocker yet due to shock.   Family updated. D/w Dr. Vassie Loll of [facility].  The patient is critically ill with multiple organ systems failure and requires high complexity decision making for assessment and support, frequent evaluation and titration of therapies, application of advanced monitoring technologies and extensive interpretation of multiple databases.   Critical Care Time devoted to patient care services described in this note is 35 Minutes.     LOS: 1 day    Arvilla Meres, MD 10/30/2012, 8:19 AM

## 2012-10-30 NOTE — Progress Notes (Signed)
INITIAL NUTRITION ASSESSMENT  DOCUMENTATION CODES Per approved criteria  -Obesity Unspecified   INTERVENTION: - If enteral nutrition desired by MD, recommend Osmolite 1.2 start at 95ml/hr increase by 10ml q4hr to goal of 2ml/hr with Prostat 30ml QID. This will provide 1264 calories, 100g protein, free water and meet 66% estimated calorie needs, 100% estimated protein needs. If IVF d/c, recommend water flushes q4hr - RD to monitor plan of care   NUTRITION DIAGNOSIS: Inadequate oral intake related to inability to eat as evidenced by NPO.   Goal: - Recommend initiate enteral nutrition if pt likely to remain intubated for the next 1-2 days. Goal would be for enteral nutrition to provide 60-70% of estimated calorie needs (22-25 kcals/kg ideal body weight) and >/= 90% of estimated protein needs, based on ASPEN guidelines for permissive underfeeding in critically ill obese individuals.  Monitor:  Weighs, labs, TF initiation, extubation  Reason for Assessment: Ventilated   66 y.o. female  Admitting Dx: Chest pain  ASSESSMENT: Pt admitted with chest pain and had emergent cath for STEMI which showed pt with acute inferior STEMI and occluded RCA pt developed cardiogenic shock and was intubated.   Patient is currently intubated on ventilator support.   MV: 12.9  Temp:Temp (24hrs), Avg:99 F (37.2 C), Min:94.1 F (34.5 C), Max:100.9 F (38.3 C)   Height: Ht Readings from Last 1 Encounters:  10/29/12 5\' 2"  (1.575 m)    Weight: Wt Readings from Last 1 Encounters:  10/30/12 182 lb 1.6 oz (82.6 kg)    Ideal Body Weight: 110 lb  % Ideal Body Weight: 165  Wt Readings from Last 10 Encounters:  10/30/12 182 lb 1.6 oz (82.6 kg)  10/30/12 182 lb 1.6 oz (82.6 kg)    Usual Body Weight: Unable to assess  % Usual Body Weight: Unable to assess  BMI:  Body mass index is 33.31 kg/(m^2).  Estimated Nutritional Needs: Kcal: 1925 Protein: 100-110g Fluid: > 1.9 L per  day   Diet Order:  NPO  EDUCATION NEEDS: -Education not appropriate at this time   Intake/Output Summary (Last 24 hours) at 10/30/12 1253 Last data filed at 10/30/12 1217  Gross per 24 hour  Intake 5356.98 ml  Output   1070 ml  Net 4286.98 ml    Last BM: PTA  Labs:   Lab 10/30/12 0410 10/29/12 2150 10/29/12 1330 10/29/12 1215  NA 141 142 -- 140  K 3.6 3.5 -- 3.3*  CL 107 107 -- 103  CO2 22 23 -- 22  BUN 16 17 -- 16  CREATININE 0.62 0.75 -- 0.60  CALCIUM 8.2* 8.2* -- 7.8*  MG -- -- 1.6 1.5  PHOS -- -- -- --  GLUCOSE 115* 121* -- 403*    CBG (last 3)   Basename 10/30/12 0755 10/30/12 0405 10/30/12 0259  GLUCAP 105* 139* 116*    Scheduled Meds:   . albuterol  4 puff Inhalation Q4H  . ampicillin-sulbactam (UNASYN) IV  1.5 g Intravenous Q8H  . antiseptic oral rinse  15 mL Mouth Rinse QID  . aspirin  81 mg Per NG tube Daily  . atorvastatin  80 mg Per NG tube q1800  . chlorhexidine  15 mL Mouth Rinse BID  . furosemide  20 mg Intravenous Once  . insulin aspart  2-6 Units Subcutaneous Q4H  . insulin glargine  10 Units Subcutaneous Q24H  . oseltamivir  75 mg Oral Daily  . pantoprazole (PROTONIX) IV  40 mg Intravenous Daily  . prasugrel  10 mg Per NG tube Daily  . sodium chloride  3 mL Intravenous Q12H    Continuous Infusions:   . sodium chloride 20 mL/hr (10/30/12 1009)  . dextrose    . fentaNYL infusion INTRAVENOUS 200 mcg/hr (10/30/12 0026)  . heparin 700 Units/hr (10/30/12 1005)  . midazolam (VERSED) infusion 3 mg/hr (10/29/12 2221)  . norepinephrine (LEVOPHED) Adult infusion 14 mcg/min (10/30/12 0700)    Past Medical History  Diagnosis Date  . Hypertension   . MI (myocardial infarction)   . GERD (gastroesophageal reflux disease)     History reviewed. No pertinent past surgical history.   Levon Hedger MS, RD, LDN [phone] Weekend/After Hours Pager

## 2012-10-30 NOTE — Progress Notes (Signed)
  PM Rounds.(30 minutes)  Remains intubated on IABP. Difficult weaning pressors throughout day and still on doap 3 levo 14. Requiring severe IVF boluses. CVP now 14.   Echo reviewed personally: EF 55% Inferior wall HK. RV normal.   Overall acting more like RV infarct - very volume dependent. However RV ok on echo. I placed IABP on 1:3 and MAPs actually increased a bit.  Will check co-ox on 1:3 if ~65% or greater. Will hold heparin and pull IABP. Continue to support with pressors and IVF.   Family updated in detail.   Truman Hayward 5:49 PM

## 2012-10-31 ENCOUNTER — Inpatient Hospital Stay (HOSPITAL_COMMUNITY): Payer: Medicare Other

## 2012-10-31 DIAGNOSIS — I4901 Ventricular fibrillation: Secondary | ICD-10-CM | POA: Diagnosis not present

## 2012-10-31 DIAGNOSIS — J95821 Acute postprocedural respiratory failure: Secondary | ICD-10-CM | POA: Diagnosis not present

## 2012-10-31 DIAGNOSIS — J9600 Acute respiratory failure, unspecified whether with hypoxia or hypercapnia: Secondary | ICD-10-CM | POA: Diagnosis not present

## 2012-10-31 DIAGNOSIS — R570 Cardiogenic shock: Secondary | ICD-10-CM | POA: Diagnosis not present

## 2012-10-31 DIAGNOSIS — R05 Cough: Secondary | ICD-10-CM | POA: Diagnosis not present

## 2012-10-31 DIAGNOSIS — I469 Cardiac arrest, cause unspecified: Secondary | ICD-10-CM | POA: Diagnosis not present

## 2012-10-31 DIAGNOSIS — J1000 Influenza due to other identified influenza virus with unspecified type of pneumonia: Secondary | ICD-10-CM | POA: Diagnosis present

## 2012-10-31 DIAGNOSIS — I2119 ST elevation (STEMI) myocardial infarction involving other coronary artery of inferior wall: Secondary | ICD-10-CM | POA: Diagnosis not present

## 2012-10-31 LAB — CBC
HCT: 31.3 % — ABNORMAL LOW (ref 36.0–46.0)
Hemoglobin: 10.5 g/dL — ABNORMAL LOW (ref 12.0–15.0)
MCV: 93.2 fL (ref 78.0–100.0)
RBC: 3.36 MIL/uL — ABNORMAL LOW (ref 3.87–5.11)
WBC: 18.9 10*3/uL — ABNORMAL HIGH (ref 4.0–10.5)

## 2012-10-31 LAB — BASIC METABOLIC PANEL
BUN: 8 mg/dL (ref 6–23)
CO2: 20 mEq/L (ref 19–32)
Calcium: 8.5 mg/dL (ref 8.4–10.5)
Chloride: 107 mEq/L (ref 96–112)
GFR calc non Af Amer: >90 mL/min (ref >90)
Glucose, Bld: 128 mg/dL — ABNORMAL HIGH (ref 70–99)
Glucose, Bld: 157 mg/dL — ABNORMAL HIGH (ref 70–99)
Sodium: 137 mEq/L (ref 135–145)
Sodium: 137 mEq/L (ref 135–145)

## 2012-10-31 LAB — BLOOD GAS, ARTERIAL
Drawn by: 10006
MECHVT: 650 mL
PEEP: 5 cmH2O
Patient temperature: 99.3
RATE: 18 resp/min
TCO2: 21.5 mmol/L (ref 0–100)
pH, Arterial: 7.389 (ref 7.350–7.450)

## 2012-10-31 LAB — GLUCOSE, CAPILLARY
Glucose-Capillary: 120 mg/dL — ABNORMAL HIGH (ref 70–99)
Glucose-Capillary: 131 mg/dL — ABNORMAL HIGH (ref 70–99)
Glucose-Capillary: 113 mg/dL — ABNORMAL HIGH (ref 70–99)

## 2012-10-31 LAB — POCT ACTIVATED CLOTTING TIME: Activated Clotting Time: 116 seconds

## 2012-10-31 LAB — CARBOXYHEMOGLOBIN: Methemoglobin: 1.4 % (ref 0.0–1.5)

## 2012-10-31 LAB — CULTURE, RESPIRATORY W GRAM STAIN: Special Requests: NORMAL

## 2012-10-31 IMAGING — CR DG CHEST 1V PORT
1 series · 1 of 1 positions shown · non-contrast
Comparison: [DATE]; [DATE]

CLINICAL DATA: Cough, congestion, flu

PORTABLE CHEST - 1 VIEW

[AP]
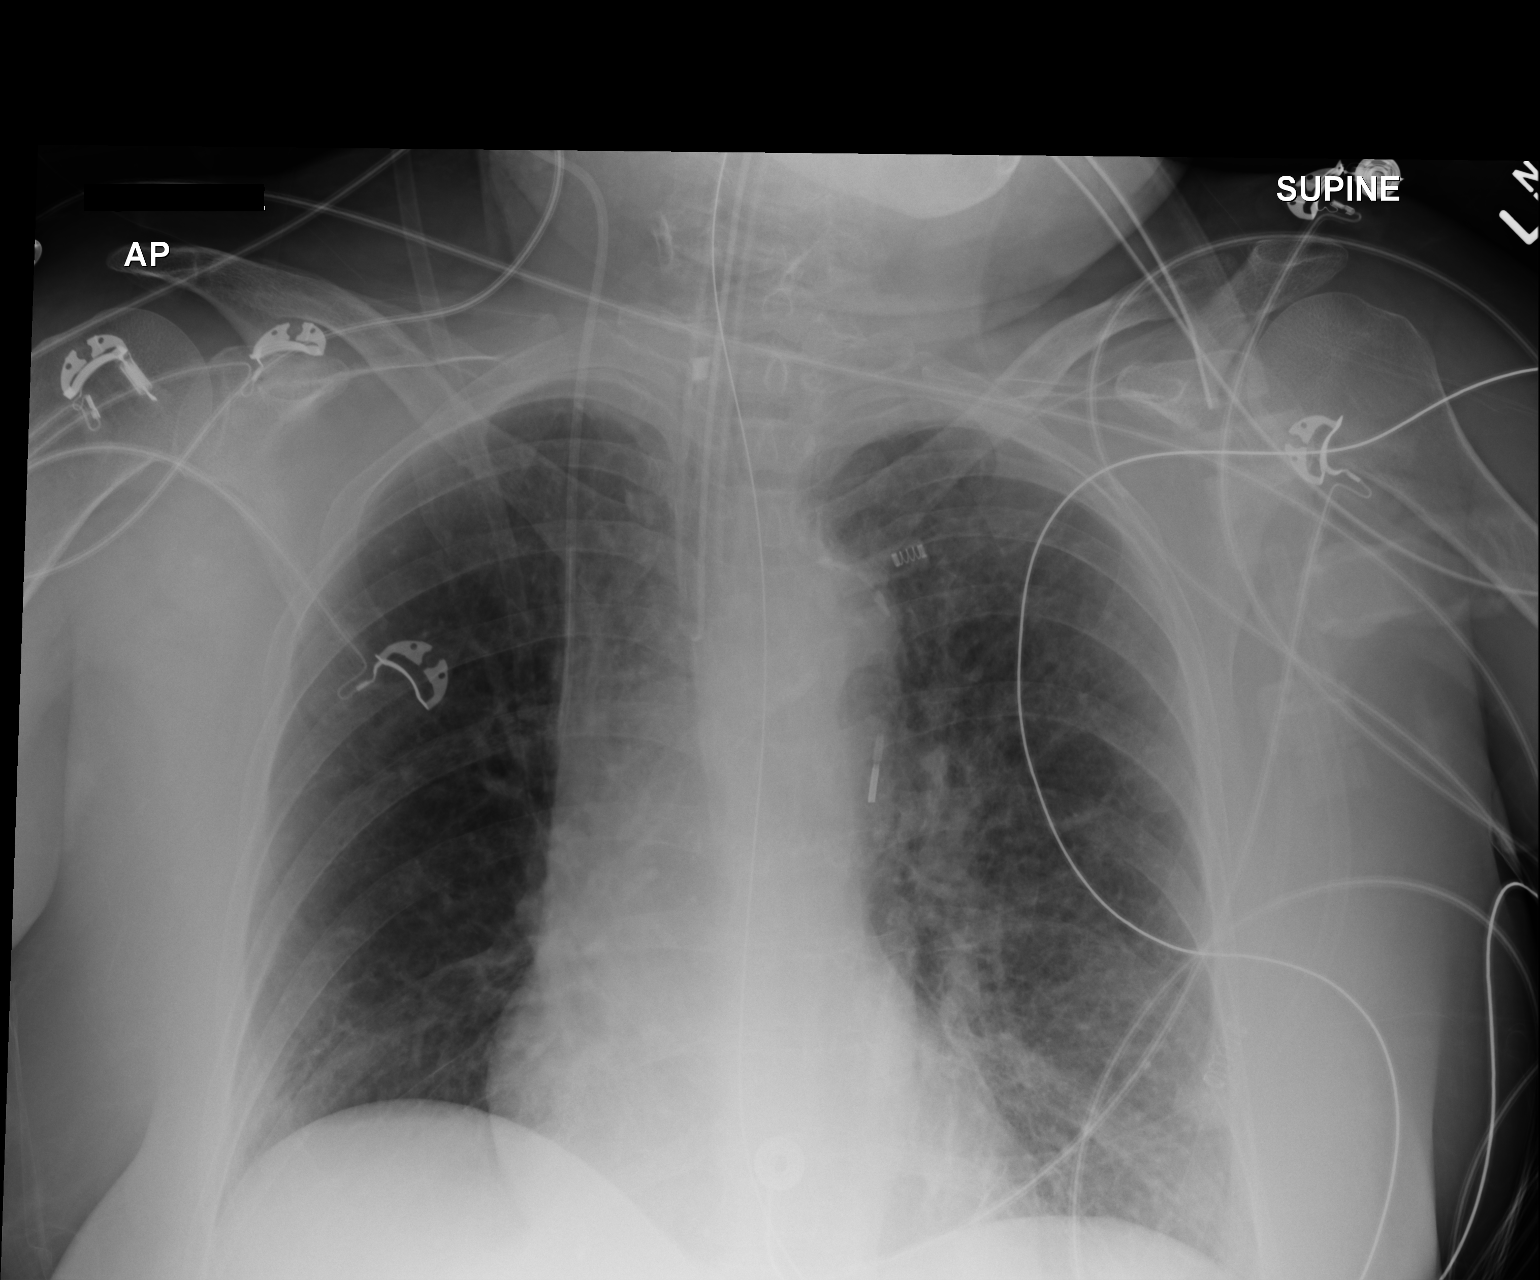

[1 of 1 positions shown; findings below may reference images not displayed]

FINDINGS: Grossly unchanged cardiac silhouette and mediastinal
contours.  Stable position of support apparatus including tip of
intra-aortic balloon pump approximately 4 cm from the superior
aspect of the aortic arch.  Pulmonary vasculature remains
indistinct, left greater than right.  Grossly unchanged bibasilar
opacities, left greater than right.  No new focal airspace opacity.
No definite pleural effusion, though note, the left costophrenic
angle is excluded view.  No pneumothorax.  Unchanged bones.
IMPRESSION: 1.  Stable positioning of support apparatus.  No pneumothorax.
2.  Grossly unchanged findings most suggestive of asymmetric
pulmonary edema.

## 2012-10-31 MED ORDER — LEVALBUTEROL HCL 0.63 MG/3ML IN NEBU
0.6300 mg | INHALATION_SOLUTION | Freq: Four times a day (QID) | RESPIRATORY_TRACT | Status: DC
  Administered 2012-10-31 – 2012-11-09 (×33): 0.63 mg via RESPIRATORY_TRACT
  Filled 2012-10-31 (×41): qty 3

## 2012-10-31 MED ORDER — METHYLPREDNISOLONE SODIUM SUCC 40 MG IJ SOLR
40.0000 mg | Freq: Two times a day (BID) | INTRAMUSCULAR | Status: DC
  Administered 2012-10-31 – 2012-11-03 (×7): 40 mg via INTRAVENOUS
  Filled 2012-10-31 (×8): qty 1

## 2012-10-31 MED ORDER — JEVITY 1.2 CAL PO LIQD
1000.0000 mL | Freq: Every day | ORAL | Status: DC
  Administered 2012-10-31: 1000 mL
  Filled 2012-10-31 (×4): qty 1000

## 2012-10-31 MED ORDER — ATROPINE SULFATE 1 MG/ML IJ SOLN
INTRAMUSCULAR | Status: AC
  Filled 2012-10-31: qty 1

## 2012-10-31 MED ORDER — LEVALBUTEROL HCL 0.63 MG/3ML IN NEBU
0.6300 mg | INHALATION_SOLUTION | Freq: Four times a day (QID) | RESPIRATORY_TRACT | Status: DC
  Filled 2012-10-31 (×4): qty 3

## 2012-10-31 MED ORDER — MAGNESIUM SULFATE 40 MG/ML IJ SOLN
2.0000 g | Freq: Once | INTRAMUSCULAR | Status: AC
  Administered 2012-10-31: 2 g via INTRAVENOUS
  Filled 2012-10-31: qty 50

## 2012-10-31 MED ORDER — PANTOPRAZOLE SODIUM 40 MG IV SOLR
40.0000 mg | Freq: Two times a day (BID) | INTRAVENOUS | Status: DC
  Administered 2012-10-31 – 2012-11-03 (×7): 40 mg via INTRAVENOUS
  Filled 2012-10-31 (×9): qty 40

## 2012-10-31 MED ORDER — ENOXAPARIN SODIUM 40 MG/0.4ML ~~LOC~~ SOLN
40.0000 mg | SUBCUTANEOUS | Status: DC
  Administered 2012-10-31 – 2012-11-08 (×9): 40 mg via SUBCUTANEOUS
  Filled 2012-10-31 (×11): qty 0.4

## 2012-10-31 MED ORDER — POTASSIUM CHLORIDE 10 MEQ/50ML IV SOLN
10.0000 meq | INTRAVENOUS | Status: AC
  Administered 2012-10-31 (×4): 10 meq via INTRAVENOUS
  Filled 2012-10-31 (×4): qty 50

## 2012-10-31 NOTE — Progress Notes (Signed)
ANTICOAGULATION CONSULT NOTE - Follow Up Consult  Pharmacy Consult for heparin Indication: STEMI/IABP  Labs:  Basename 10/31/12 0357 10/30/12 2155 10/30/12 2115 10/30/12 1643 10/30/12 1600 10/30/12 1530 10/30/12 0910 10/30/12 0410 10/29/12 2150 10/29/12 1030  HGB 10.5* -- -- -- -- -- -- 11.8* -- --  HCT 31.3* -- -- -- -- -- -- 35.5* 38.0 --  PLT 227 -- -- -- -- -- -- 271 286 --  APTT -- -- -- -- -- -- -- -- -- --  LABPROT -- -- -- -- -- -- -- -- -- 13.4  INR -- -- -- -- -- -- -- -- -- 1.03  HEPARINUNFRC 0.34 -- -- 0.21* -- -- -- -- -- --  CREATININE -- 0.54 -- -- 0.54 -- -- 0.62 -- --  CKTOTAL -- -- -- -- -- -- -- -- -- 81  CKMB -- -- -- -- -- -- -- -- -- 4.1*  TROPONINI -- -- >20.00* -- -- >20.00* >20.00* -- -- --    Assessment/Plan:  66yo female remains therapeutic on heparin with IABP in place; RN notes coffee-ground material in NG drainage, heparin to continue for now with IABP per CCM, Hgb down slightly; given clinical picture will continue gtt at current rate for now and confirm level remains stable with next lab draw.  Colleen Can PharmD BCPS 10/31/2012,4:45 AM

## 2012-10-31 NOTE — Progress Notes (Signed)
Increased ventricular ectopy and runs of VTach after patient switched to 1:2.  Patient changed back to 1:1 with cessation of ectopy.  Will notify MD on morning rounds.

## 2012-10-31 NOTE — Progress Notes (Addendum)
PULMONARY  / CRITICAL CARE MEDICINE  Name: Samantha King MRN: [id number] DOB: May 28, 1946    LOS: 2  REFERRING MD :  Clifton James  CHIEF COMPLAINT:  Respiratory failure   BRIEF PATIENT DESCRIPTION:  66 year old female smoker admitted on 12/20 after VF arrest in setting of  STEMI. Had < 1 minute of CPR. Was oriented on arrival to ER. Went to cath lab where was found to have occluded RCA. Developed shock and hypoperfusion during case. Intubated. DES placed in RCA. IABP placed, PCCM asked to see.   LINES / TUBES: oett 12/20>>> Right fem venous sheath 12/20>>> Right fem IABP 12/20>>> Left art sheath 12/20>>>  CULTURES: Sputum 12/20>>>no org seen on gram stain>>> Influenza PCR 12/20>>>Pos influenza A, H1N1 Urine strep 12/20>>>neg Urine legionella 12/20>>>  ANTIBIOTICS: tamiflu 12/20(H1N1 pos)>>> unasyn 12/21 (CAP)>>  SIGNIFICANT EVENTS:  Cardiac cath 12/20: Occluded proximal RCA now s/p PTCA/DES x 1   LEVEL OF CARE:  ICU  PRIMARY SERVICE:  PCCM CONSULTANTS:  PCCM  CODE STATUS: full  DIET: start TF 12/22 DVT Px:  integrilin  GI Px:  PPI    INTERVAL HISTORY:   on drips, trapping air on vent . Short runs of Vtach   VITAL SIGNS: Temp:  [99.1 F (37.3 C)-101.3 F (38.5 C)] 99.7 F (37.6 C) (12/22 0800) Pulse Rate:  [35-83] 69  (12/22 0800) Resp:  [17-18] 18  (12/22 0800) BP: (72-129)/(40-80) 105/62 mmHg (12/22 0800) SpO2:  [97 %-100 %] 98 % (12/22 0800) Arterial Line BP: (84-151)/(43-84) 137/65 mmHg (12/22 0800) FiO2 (%):  [40 %-50 %] 40 % (12/22 0953) Weight:  [84.5 kg (186 lb 4.6 oz)] 84.5 kg (186 lb 4.6 oz) (12/22 0443) HEMODYNAMICS: CVP:  [12 mmHg-16 mmHg] 14 mmHg VENTILATOR SETTINGS: Vent Mode:  [-] PRVC FiO2 (%):  [40 %-50 %] 40 % Set Rate:  [12 bmp-18 bmp] 12 bmp Vt Set:  [650 mL] 650 mL PEEP:  [5 cmH20] 5 cmH20 Plateau Pressure:  [20 cmH20-25 cmH20] 20 cmH20 With reduction of rate to12 and decrease ITime to .70 Vee reduces to 1-2 and eliminates  autopeep INTAKE / OUTPUT: Intake/Output      12/21 0701 - 12/22 0700 12/22 0701 - 12/23 0700   I.V. (mL/kg) 2921.7 (34.6) 84.3 (1)   NG/GT 160    IV Piggyback 400    Total Intake(mL/kg) 3481.7 (41.2) 84.3 (1)   Urine (mL/kg/hr) 1105 (0.5) 125   Emesis/NG output 350    Total Output 1455 125   Net +2026.7 -40.7          PHYSICAL EXAMINATION: General:  Acutely ill appearing white female  Neuro:  Non focal HEENT:  Orally intubated  Cardiovascular:  Regular rhythm on tele.  Lungs:  Prolonged exp wheeze , autoPEEP+ vent rate 18  Abdomen:  Soft, mottled over abd  Musculoskeletal:  Skin cool and mottled. Weak pulses  Skin:  Cool and mottled    LABS: Cbc  Lab 10/31/12 0357 10/30/12 0410 10/29/12 2150  WBC 18.9* -- --  HGB 10.5* 11.8* 12.6  HCT 31.3* 35.5* 38.0  PLT 227 271 286    Chemistry   Lab 10/31/12 0357 10/30/12 2155 10/30/12 1600 10/29/12 1330 10/29/12 1215  NA 137 141 140 -- --  K 4.0 3.6 3.6 -- --  CL 107 110 110 -- --  CO2 20 20 21  -- --  BUN 10 13 14  -- --  CREATININE 0.51 0.54 0.54 -- --  CALCIUM 8.2* 8.3* 7.9* -- --  MG --  1.5 -- 1.6 1.5  PHOS -- -- -- -- --  GLUCOSE 128* 116* 113* -- --    Liver fxn  Lab 10/30/12 0410 10/29/12 1215 10/29/12 1030  AST 355* -- 33  ALT 88* -- 28  ALKPHOS 76 89 86  BILITOT 0.2* -- 0.1*  PROT 5.3* -- 6.0  ALBUMIN 2.8* -- 3.1*   coags  Lab 10/29/12 1030  APTT --  INR 1.03   Sepsis markers  Lab 10/31/12 0357 10/30/12 0500 10/30/12 0420 10/29/12 1300 10/29/12 1231  LATICACIDVEN -- -- 1.6 3.0* --  PROCALCITON 0.64 1.19 -- -- <0.10   Cardiac markers  Lab 10/31/12 0300 10/30/12 2115 10/30/12 1530 10/29/12 1030  CKTOTAL -- -- -- 81  CKMB -- -- -- 4.1*  TROPONINI >20.00* >20.00* >20.00* --   BNP  Lab 10/30/12 0410  PROBNP 1268.0*   ABG  Lab 10/31/12 0423 10/30/12 0942 10/30/12 0831  PHART 7.389 7.431 7.471*  PCO2ART 34.8* 33.3* 28.3*  PO2ART 101.0* 139.0* 156.0*  HCO3 20.4 21.8 20.3  TCO2 21.5 22.8  21    CBG trend  Lab 10/31/12 0352 10/31/12 0009 10/30/12 1931 10/30/12 1558 10/30/12 1143  GLUCAP 131* 120* 94 114* 100*    IMAGING:  12/22 CXR:  L>R edema. ?infiltrate LLL   DIAGNOSES: Active Problems:  Hypertension  GERD (gastroesophageal reflux disease)  Acute respiratory failure  Cardiogenic shock  Cardiac arrest  ST elevation myocardial infarction (STEMI) of inferior wall  H1N1 influenza with pneumonia   ASSESSMENT / PLAN:  PULMONARY  ASSESSMENT: Acute respiratory failure Mostly in the setting of shock and hypoperfusion.  H1N1 and prob PNA LLL PLAN:   Full vent support, no wean Lower RR for autoPEEP, lower I time to .70  Intermittent sedation protocol  Start steroids d/t bronchospasm Change albuterol to xopenex d/t Vtach runs  CARDIOVASCULAR  ASSESSMENT:  Acute anterior STEMI  S/p cardiac cath with occluded RCA. s/p PTCA/DES X1 on 12/20 Cardiogenic shock CHB on multiple pressors and IABP.  Runs of Vtach  PLAN:  Per cards IABP to come out today 12/22. Amiodarone per cardiology  RENAL  ASSESSMENT:  Metabolic acidosis (positive anion gap c/w lactic acidosis) improved  PLAN:   Monitor bmet     GASTROINTESTINAL  ASSESSMENT:   No acute issue PLAN:   Start TFs SUP  HEMATOLOGIC  ASSESSMENT:   No evidence of anemia  PLAN:  Serial CBCs integrillen and effient per cards    INFECTIOUS  ASSESSMENT:   H1N1 POS.  - on tamiflu prob LLL PNA  PLAN:   Cont tamiflu Cont  unasyn    ENDOCRINE  ASSESSMENT:   Hyperglycemia  PLAN:   cbgs q 4  NEUROLOGIC  ASSESSMENT:  pain   PLAN:   Cont sedation -minimise versed, ok to use fent gtt  hypothermia protocol deferred  GLOBAL - updated sons and husband at bedside   I have personally obtained a history, examined the patient, evaluated laboratory and imaging results, formulated the assessment and plan and placed orders.   CRITICAL CARE: The patient is critically ill with multiple  organ systems failure and requires high complexity decision making for assessment and support, frequent evaluation and titration of therapies, application of advanced monitoring technologies and extensive interpretation of multiple databases. Critical Care Time devoted to patient care services described in this note is 40  minutes.   Dorcas Carrow Beeper  [phone]  Cell  [phone]  If no response or cell goes to voicemail, call beeper [phone]  10/31/2012, 9:57 AM

## 2012-10-31 NOTE — Progress Notes (Signed)
eLink Physician-Brief Progress Note Patient Name: Wyonia Fontanella DOB: December 31, 1945 MRN: [id number]  Date of Service  10/31/2012   HPI/Events of Note   Arrhythmia.  Hypokalemia.  Hypomagnesemia.  eICU Interventions   K / Mg   Intervention Category Intermediate Interventions: Arrhythmia - evaluation and management  Earlee Herald 10/31/2012, 1:08 AM

## 2012-10-31 NOTE — Progress Notes (Addendum)
Dr. Marin Shutter notified of coffee ground material in NG drainage.  Orders rec'd.Unable to wean Levophed past 14 mcg/min-BP falls to 70s systolic.

## 2012-10-31 NOTE — Progress Notes (Addendum)
Subjective:   Ms. Esch is a 66 yo female smoker with no prior cardiac history. Admitted 12/20 with inferior STEMI/VF arrest in setting of influenza. Treated with PCI/DES to RCA. 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 Now intubated on IABP.  Remains on vent with IABP in place. On levophed and dopamine.   IABP weaning now 1:3. MAPs in 90s. Coming down on inotropes. Had some NSVT overnight. Back on amio.   Making good urine. CVP 14. Troponin persistently elevated.  Co-ox 78%.   Hgb down slightly.   ECHO yesterday reviewed personally  EF ~50%. Inferior/posterior HK. RV looks ok.  ECG: Low volts. Prominent inferior Qs.     Intake/Output Summary (Last 24 hours) at 10/31/12 0850 Last data filed at 10/31/12 0800  Gross per 24 hour  Intake 3380.8 ml  Output   1540 ml  Net 1840.8 ml    Current meds:    . albuterol  4 puff Inhalation Q4H  . ampicillin-sulbactam (UNASYN) IV  1.5 g Intravenous Q8H  . antiseptic oral rinse  15 mL Mouth Rinse QID  . aspirin  81 mg Per NG tube Daily  . atorvastatin  80 mg Per NG tube q1800  . chlorhexidine  15 mL Mouth Rinse BID  . furosemide  20 mg Intravenous Once  . insulin aspart  2-6 Units Subcutaneous Q4H  . insulin glargine  10 Units Subcutaneous Q24H  . oseltamivir  75 mg Oral BID  . pantoprazole (PROTONIX) IV  40 mg Intravenous Q12H  . prasugrel  10 mg Per NG tube Daily  . sodium chloride  3 mL Intravenous Q12H   Infusions:    . sodium chloride 20 mL/hr (10/30/12 1009)  . amiodarone (NEXTERONE PREMIX) 360 mg/200 mL dextrose 30 mg/hr (10/31/12 0246)  . dextrose    . DOPamine 3.5 mcg/kg/min (10/31/12 0500)  . fentaNYL infusion INTRAVENOUS 200 mcg/hr (10/31/12 0800)  . heparin 700 Units/hr (10/30/12 1005)  . midazolam (VERSED) infusion 3 mg/hr (10/31/12 0800)  . norepinephrine (LEVOPHED) Adult infusion 13 mcg/min (10/31/12 0400)     Objective:  Blood pressure 105/62,  pulse 69, temperature 99.7 F (37.6 C), temperature source Oral, resp. rate 18, height 5\' 2"  (1.575 m), weight 84.5 kg (186 lb 4.6 oz), SpO2 98.00%. Weight change: 5.4 kg (11 lb 14.5 oz)  Physical Exam: General:  Intubated. Sedated. Critically ill.  HEENT: normal Neck: supple. JVP up. Carotids 2+ bilat; no bruits. No lymphadenopathy or thryomegaly appreciated. Cor: PMI nondisplaced. Regular rate & rhythm. No rubs, gallops or murmurs. Lungs: mild wheeze Abdomen: soft, nontender, + distended. Hypoactive bowel sounds. Extremities: no cyanosis, clubbing, rash, 1+ edema. IABP in place. Sheaths in place. Neuro: sedated. Non-focal  Telemetry: SR occasional NSVT  Lab Results: Basic Metabolic Panel:  Lab 10/31/12 1610 10/30/12 2155 10/30/12 1600 10/30/12 0410 10/29/12 2150 10/29/12 1330 10/29/12 1215  NA 137 141 140 141 142 -- --  K 4.0 3.6 -- -- -- -- --  CL 107 110 110 107 107 -- --  CO2 20 20 21 22 23  -- --  GLUCOSE 128* 116* 113* 115* 121* -- --  BUN 10 13 14 16 17  -- --  CREATININE 0.51 0.54 0.54 0.62 0.75 -- --  CALCIUM 8.2* 8.3* 7.9* 8.2* 8.2* -- --  MG -- 1.5 -- -- -- 1.6 1.5  PHOS -- -- -- -- -- -- --   Liver Function Tests:  Lab 10/30/12 0410 10/29/12 1215 10/29/12 1030  AST  355* -- 33  ALT 88* -- 28  ALKPHOS 76 89 86  BILITOT 0.2* -- 0.1*  PROT 5.3* -- 6.0  ALBUMIN 2.8* -- 3.1*   No results found for this basename: LIPASE:5,AMYLASE:5 in the last 168 hours No results found for this basename: AMMONIA:5 in the last 168 hours CBC:  Lab 10/31/12 0357 10/30/12 0410 10/29/12 2150 10/29/12 1215 10/29/12 1046 10/29/12 1030  WBC 18.9* 18.5* 22.5* 25.4* -- 15.3*  NEUTROABS -- -- -- -- -- 10.7*  HGB 10.5* 11.8* 12.6 13.3 12.9 --  HCT 31.3* 35.5* 38.0 39.6 38.0 --  MCV 93.2 92.9 93.8 92.5 -- 96.5  PLT 227 271 286 305 -- 285   Cardiac Enzymes:  Lab 10/31/12 0300 10/30/12 2115 10/30/12 1530 10/30/12 0910 10/29/12 1855 10/29/12 1030  CKTOTAL -- -- -- -- -- 81  CKMB -- -- --  -- -- 4.1*  CKMBINDEX -- -- -- -- -- --  TROPONINI >20.00* >20.00* >20.00* >20.00* >20.00* --   BNP: No components found with this basename: POCBNP:5 CBG:  Lab 10/31/12 0352 10/31/12 0009 10/30/12 1931 10/30/12 1558 10/30/12 1143  GLUCAP 131* 120* 94 114* 100*   Microbiology: Lab Results  Component Value Date   CULT Culture reincubated for better growth 10/29/2012    Lab 10/29/12 1315  CULT Culture reincubated for better growth  SDES TRACHEAL ASPIRATE    Imaging: Dg Chest Port 1 View  10/31/2012  *RADIOLOGY REPORT*  Clinical Data: Cough, congestion, flu  PORTABLE CHEST - 1 VIEW  Comparison: 10/30/2012; 10/29/2012  Findings: Grossly unchanged cardiac silhouette and mediastinal contours.  Stable position of support apparatus including tip of intra-aortic balloon pump approximately 4 cm from the superior aspect of the aortic arch.  Pulmonary vasculature remains indistinct, left greater than right.  Grossly unchanged bibasilar opacities, left greater than right.  No new focal airspace opacity. No definite pleural effusion, though note, the left costophrenic angle is excluded view.  No pneumothorax.  Unchanged bones.  IMPRESSION: 1.  Stable positioning of support apparatus.  No pneumothorax. 2.  Grossly unchanged findings most suggestive of asymmetric pulmonary edema.   Original Report Authenticated By: Tacey Ruiz, MD    Portable Chest Xray In Am  10/30/2012  *RADIOLOGY REPORT*  Clinical Data: Intubation, follow-up  PORTABLE CHEST - 1 VIEW  Comparison: Portable chest x-ray of 10/29/2012  Findings: The carina is difficult to visualize on this rotated patient, but the tip of endotracheal tube appears to be approximately 3.7 cm above the carina.  There is minimal pulmonary vascular congestion present.  Right central venous line tip overlies the mid upper SVC.  No pneumothorax is seen.  The aortic balloon tip overlies the lower aortic knob.  IMPRESSION: Little change in aeration with mild  pulmonary vascular congestion.   Original Report Authenticated By: Dwyane Dee, M.D.    Dg Chest Port 1 View  10/29/2012  *RADIOLOGY REPORT*  Clinical Data: Respiratory difficulty  PORTABLE CHEST - 1 VIEW  Comparison: None.  Findings: Endotracheal tube is 3.9 cm from the carina.  NG tube is beyond the gastroesophageal junction.  Transvenous pacemaker from the IVC in place with its tip projecting over the tip of the right ventricle.  Right internal jugular vein center venous catheter tip in the mid SVC.  Intra-aortic balloon pump tip in the proximal descending aorta.  Low lung volumes.  Normal heart size.  No pneumothorax.  Minimal patchy density at the lung bases.  IMPRESSION: Tubular structures, transvenous pacer and intra-aortic balloon pump  appropriately positioned.  Bibasilar atelectasis verses airspace disease.   Original Report Authenticated By: Jolaine Click, M.D.      ASSESSMENT:  1. Inferior STEMI - s/p PCI/DES to RCA on 12/20 2. VF arrest 3. Cardiogenic shock 4. VDRF 5. Influenza 6. Severe metabolic acidosis - improving 7. Hypokalemia 8. COPD 9. NSVT  PLAN/DISCUSSION:  Despite relatively normal EF on echo - she has had a big inferior/possible RV infarct.  Remains quite tenuous though hemodynamics improving some. Acting somewhat like an RV infarct but RV ok on echo. Will pull IABP today. Continue to wean inotropes as tolerated. CVP up will also diurese as tolerated but given RV infarct physiology will need to be very careful with this. Continue Effient. No ace-i or b-blocker due to shock.  Will continue amio for VT.  Continue rx for flu.  Hopefully we can wean vent in next day or two. Does not appear ready for it today.   The patient is critically ill with multiple organ systems failure and requires high complexity decision making for assessment and support, frequent evaluation and titration of therapies, application of advanced monitoring technologies and extensive interpretation  of multiple databases.   Critical Care Time devoted to patient care services described in this note is 35 Minutes.    LOS: 2 days    Arvilla Meres, MD 10/31/2012, 8:50 AM

## 2012-10-31 NOTE — Procedures (Signed)
Arterial Catheter Insertion Procedure Note Samantha King [id number] May 30, 1946  Procedure: Insertion of Arterial Catheter  Indications: Blood pressure monitoring  Procedure Details Consent: Risks of procedure as well as the alternatives and risks of each were explained to the (patient/caregiver).  Consent for procedure obtained. Time Out: Verified patient identification, verified procedure, site/side was marked, verified correct patient position, special equipment/implants available, medications/allergies/relevent history reviewed, required imaging and test results available.  Performed  Maximum sterile technique was used including antiseptics. Skin prep: Chlorhexidine; local anesthetic administered 20 gauge catheter was inserted into right radial artery using the Seldinger technique.  Evaluation Blood flow good; BP tracing good. Complications: No apparent complications.  Aline placed per MD order. Pt tolerated well, RT will continue to monitor.  Closson, Terie Purser 10/31/2012

## 2012-10-31 NOTE — Progress Notes (Signed)
Right Fem  IABP and R fem venous sheath pulled per orders pressure held by self and Dr Gala Romney x 30 minutes, hemostatis achieved, site level zero. FEM stop applied per orders. Positive distal pulse Left fem art sheath pulled per orders pressure held x 20 minutes by self and Dr Gala Romney hemostasis achieved , site level zero. Positive distal pulse

## 2012-11-01 ENCOUNTER — Inpatient Hospital Stay (HOSPITAL_COMMUNITY): Payer: Medicare Other

## 2012-11-01 DIAGNOSIS — J9600 Acute respiratory failure, unspecified whether with hypoxia or hypercapnia: Secondary | ICD-10-CM | POA: Diagnosis not present

## 2012-11-01 DIAGNOSIS — I4901 Ventricular fibrillation: Secondary | ICD-10-CM | POA: Diagnosis not present

## 2012-11-01 DIAGNOSIS — D62 Acute posthemorrhagic anemia: Secondary | ICD-10-CM | POA: Diagnosis not present

## 2012-11-01 DIAGNOSIS — I2119 ST elevation (STEMI) myocardial infarction involving other coronary artery of inferior wall: Secondary | ICD-10-CM | POA: Diagnosis not present

## 2012-11-01 DIAGNOSIS — R570 Cardiogenic shock: Secondary | ICD-10-CM | POA: Diagnosis not present

## 2012-11-01 DIAGNOSIS — D3500 Benign neoplasm of unspecified adrenal gland: Secondary | ICD-10-CM | POA: Diagnosis not present

## 2012-11-01 DIAGNOSIS — I469 Cardiac arrest, cause unspecified: Secondary | ICD-10-CM | POA: Diagnosis not present

## 2012-11-01 DIAGNOSIS — J9819 Other pulmonary collapse: Secondary | ICD-10-CM | POA: Diagnosis not present

## 2012-11-01 LAB — BASIC METABOLIC PANEL
BUN: 8 mg/dL (ref 6–23)
CO2: 23 mEq/L (ref 19–32)
Calcium: 8.3 mg/dL — ABNORMAL LOW (ref 8.4–10.5)
Calcium: 8.9 mg/dL (ref 8.4–10.5)
Creatinine, Ser: 0.4 mg/dL — ABNORMAL LOW (ref 0.50–1.10)
Creatinine, Ser: 0.44 mg/dL — ABNORMAL LOW (ref 0.50–1.10)
GFR calc Af Amer: >90 mL/min (ref >90)
GFR calc non Af Amer: >90 mL/min (ref >90)
Glucose, Bld: 177 mg/dL — ABNORMAL HIGH (ref 70–99)
Glucose, Bld: 153 mg/dL — ABNORMAL HIGH (ref 70–99)
Potassium: 4.5 mEq/L (ref 3.5–5.1)
Sodium: 139 mEq/L (ref 135–145)

## 2012-11-01 LAB — CBC
HCT: 26.5 % — ABNORMAL LOW (ref 36.0–46.0)
HCT: 31.8 % — ABNORMAL LOW (ref 36.0–46.0)
Hemoglobin: 8.9 g/dL — ABNORMAL LOW (ref 12.0–15.0)
Hemoglobin: 10.4 g/dL — ABNORMAL LOW (ref 12.0–15.0)
MCH: 31.7 pg (ref 26.0–34.0)
MCV: 94.3 fL (ref 78.0–100.0)
MCV: 94.6 fL (ref 78.0–100.0)
Platelets: 201 10*3/uL (ref 150–400)
RBC: 2.81 MIL/uL — ABNORMAL LOW (ref 3.87–5.11)
RDW: 15.2 % (ref 11.5–15.5)
WBC: 16.5 10*3/uL — ABNORMAL HIGH (ref 4.0–10.5)
WBC: 20.6 10*3/uL — ABNORMAL HIGH (ref 4.0–10.5)

## 2012-11-01 LAB — CARBOXYHEMOGLOBIN: O2 Saturation: 73.8 %

## 2012-11-01 LAB — GLUCOSE, CAPILLARY
Glucose-Capillary: 172 mg/dL — ABNORMAL HIGH (ref 70–99)
Glucose-Capillary: 155 mg/dL — ABNORMAL HIGH (ref 70–99)

## 2012-11-01 LAB — OCCULT BLOOD GASTRIC / DUODENUM (SPECIMEN CUP): Occult Blood, Gastric: POSITIVE — AB

## 2012-11-01 LAB — ABO/RH: ABO/RH(D): O NEG

## 2012-11-01 LAB — TYPE AND SCREEN

## 2012-11-01 IMAGING — CR DG CHEST 1V PORT
1 series · 1 of 1 positions shown · non-contrast
Comparison: [DATE].

CLINICAL DATA: Respiratory distress.

PORTABLE CHEST - 1 VIEW

[AP]
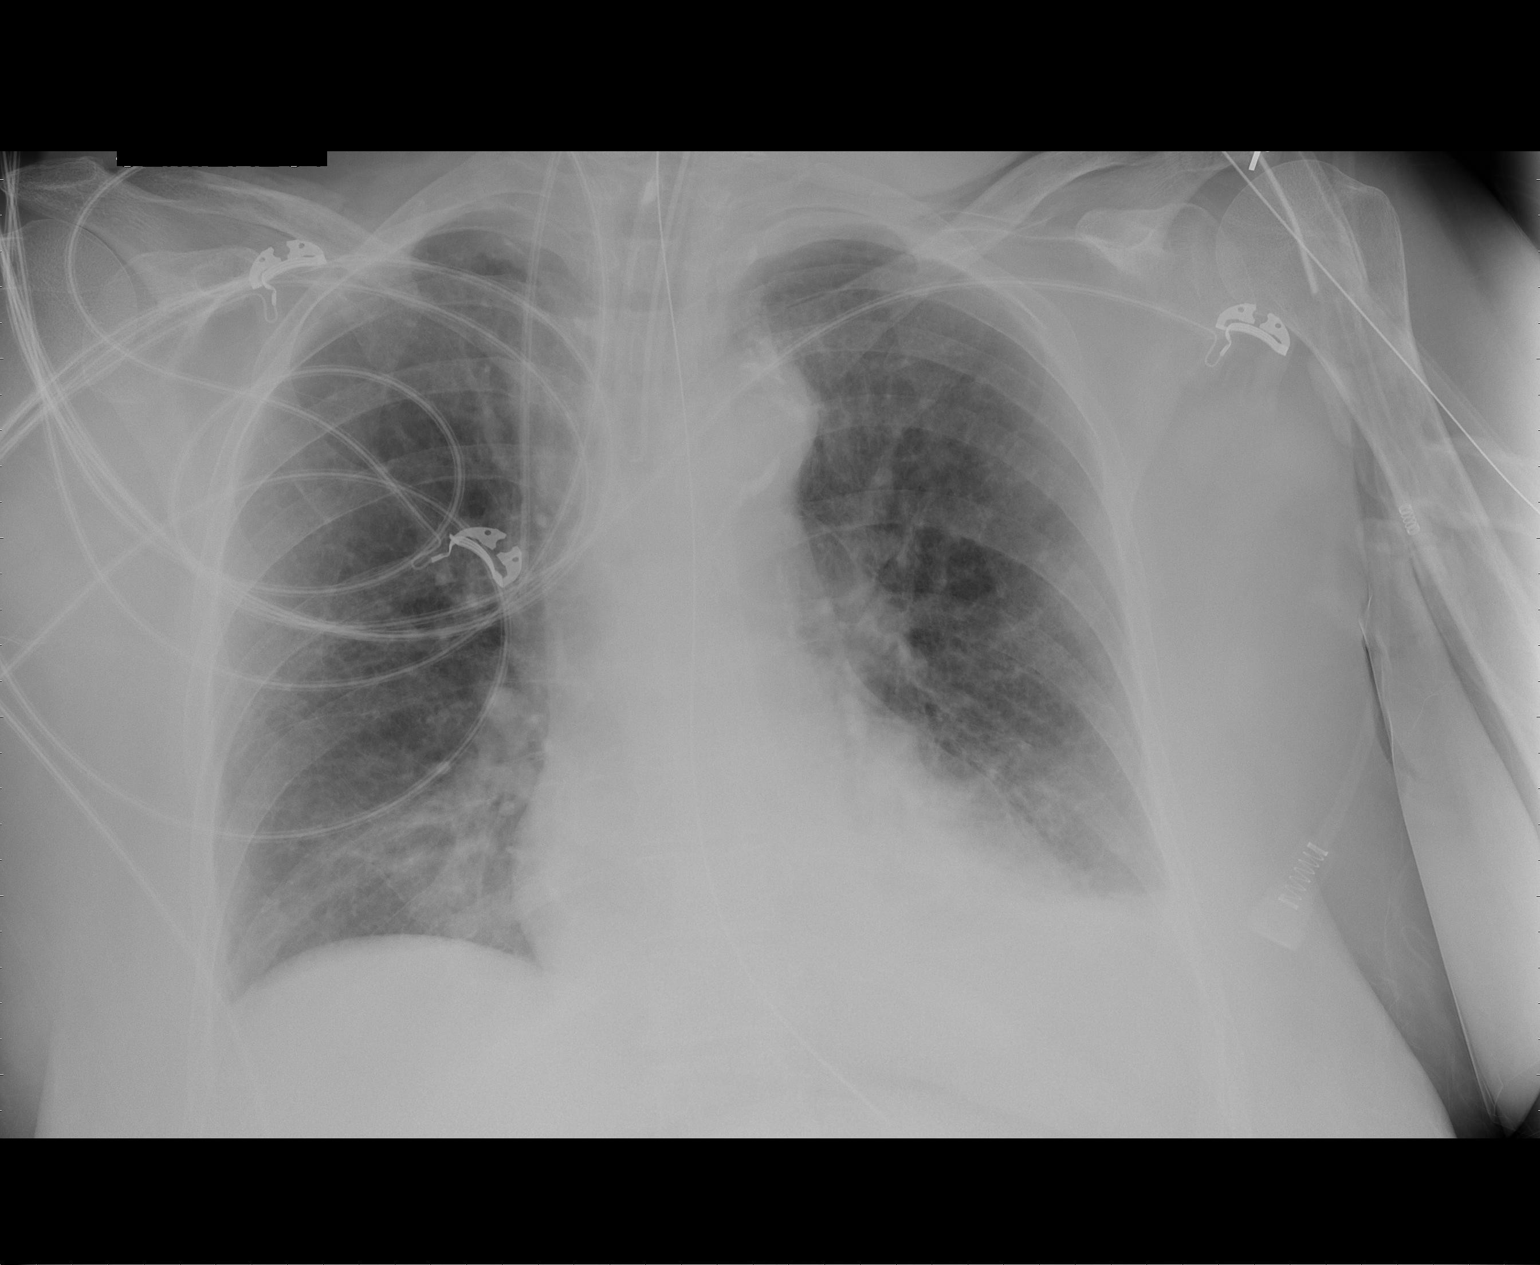

[1 of 1 positions shown; findings below may reference images not displayed]

FINDINGS: The endotracheal tube, NG tube and right IJ catheters are
stable.  The IABP has been removed.  The cardiac silhouette,
mediastinal and hilar contours are stable.  There is vascular
congestion and slight increase and bibasilar atelectasis.  Probable
small pleural effusions.
IMPRESSION: 1.  Stable support apparatus except the IABP has been removed.
2.  Slight increase in vascular congestion and bibasilar
atelectasis.

## 2012-11-01 IMAGING — CT CT ABD-PELV W/O CM
2 of 4 series · 16 of 46 positions shown, 18 images · non-contrast
Comparison: None.

CLINICAL DATA: Decreased hemoglobin, post cardiac catheterization
[DATE] with bilateral femoral catheter placement.

CT ABDOMEN AND PELVIS WITHOUT CONTRAST
TECHNIQUE: Multidetector CT imaging of the abdomen and pelvis was
performed following the standard protocol without intravenous
contrast.

[Series 2: abd/pelv w/o 5.0 b31f st · axial · non-contrast · 0.88mm/px · z∈[-474,-68]mm · 13 of 89 slices shown, 15 images]
[im 4/89  soft-tissue]
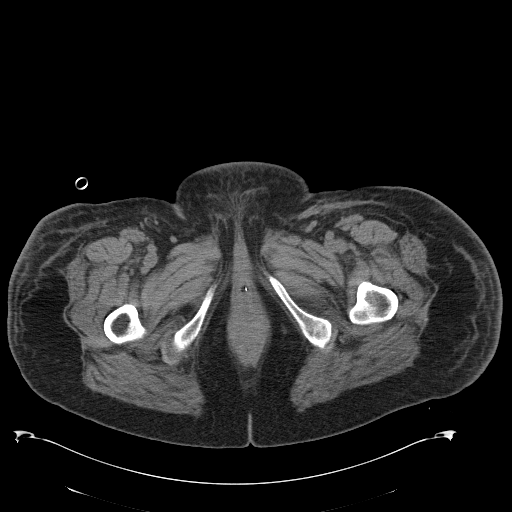
[im 4/89  bone]
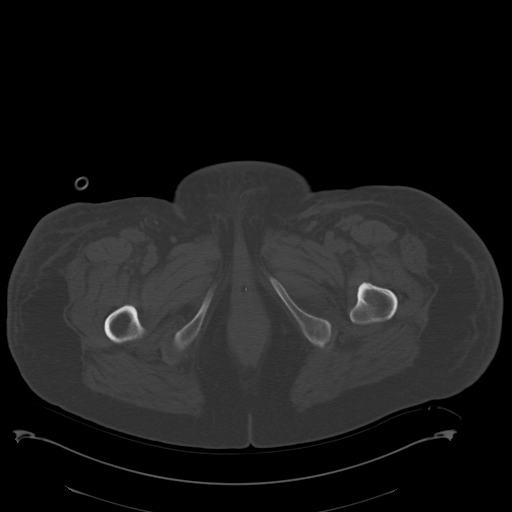
[im 11/89  soft-tissue]
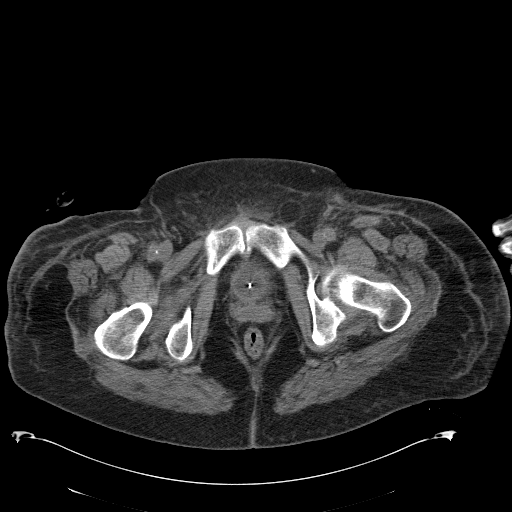
[im 18/89  soft-tissue]
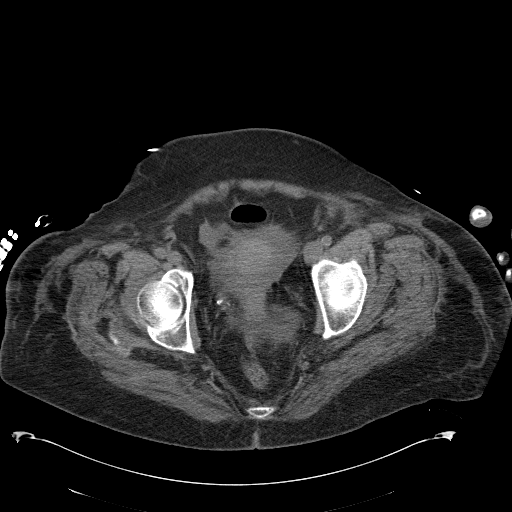
[im 25/89  soft-tissue]
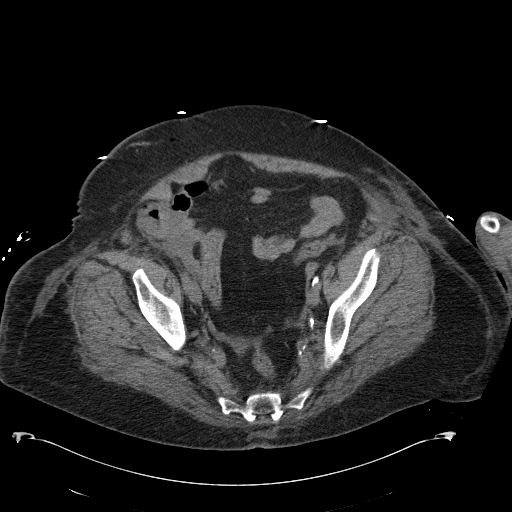
[im 32/89  soft-tissue]
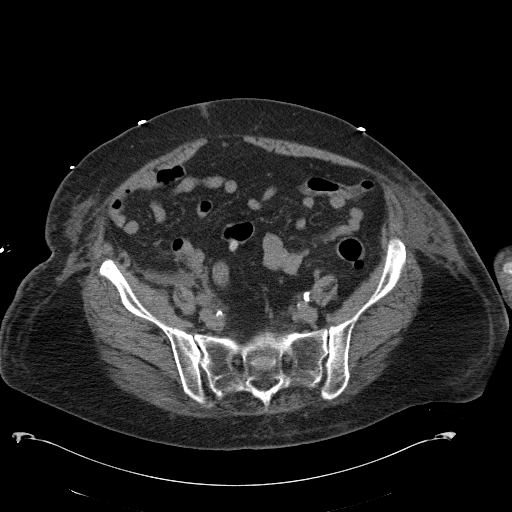
[im 39/89  soft-tissue]
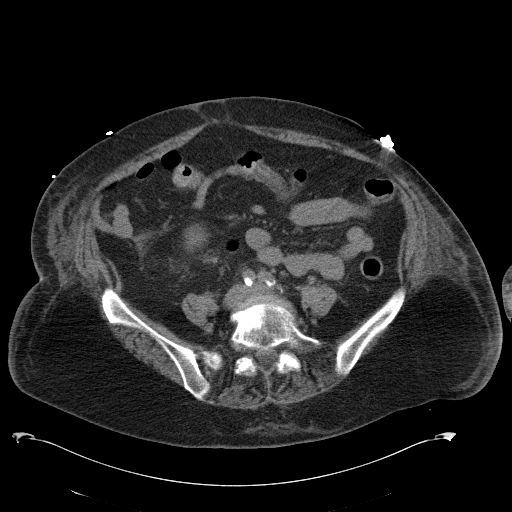
[im 46/89  soft-tissue]
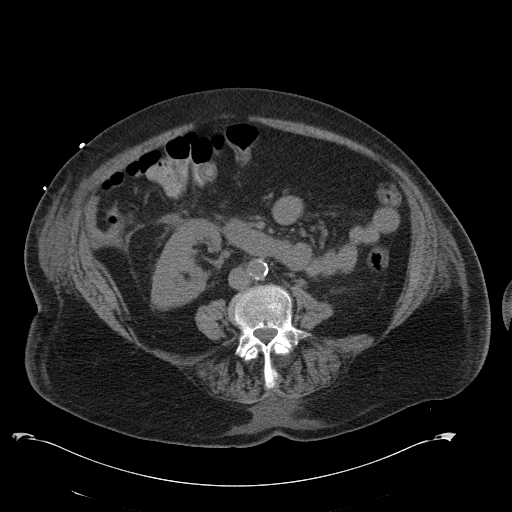
[im 50/89  soft-tissue]
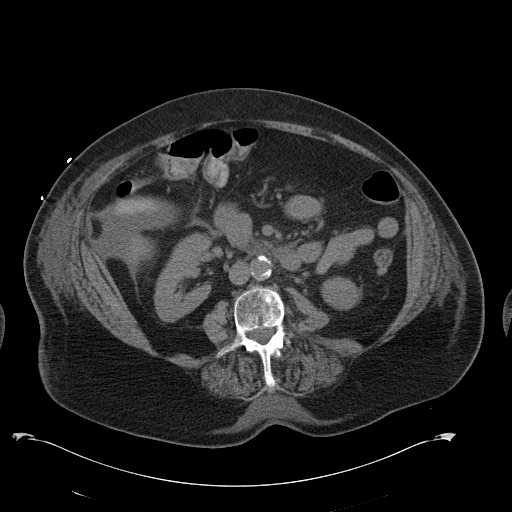
[im 57/89  soft-tissue]
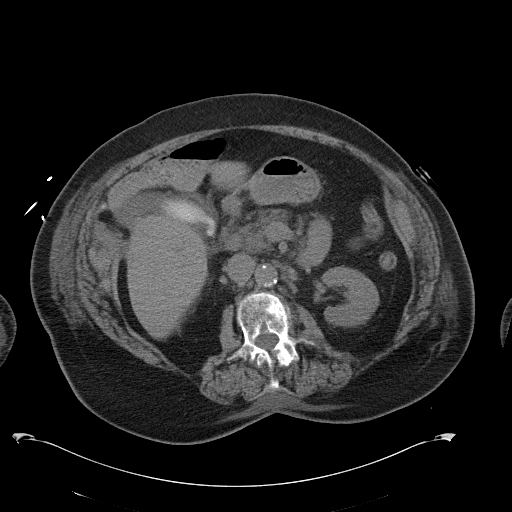
[im 57/89  bone]
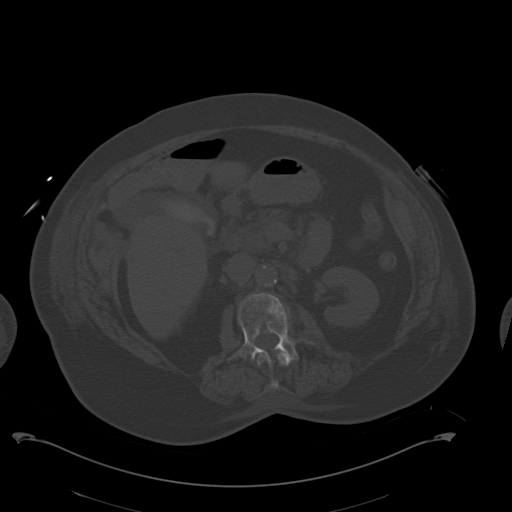
[im 64/89  soft-tissue]
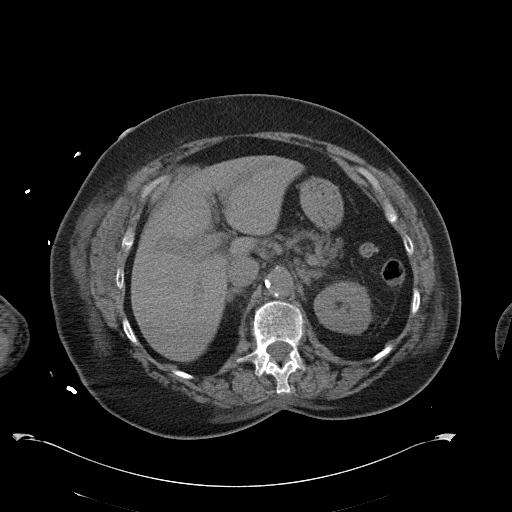
[im 71/89  soft-tissue]
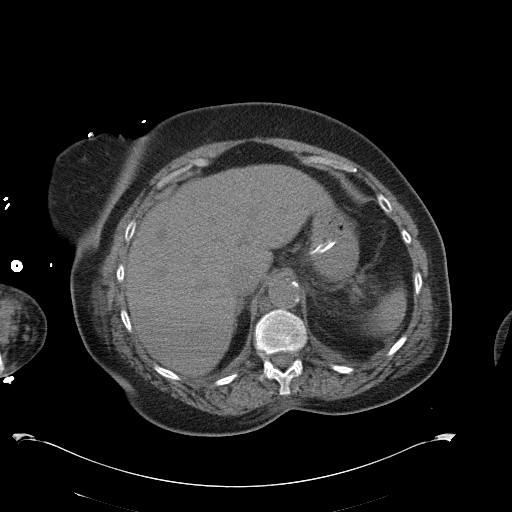
[im 78/89  soft-tissue]
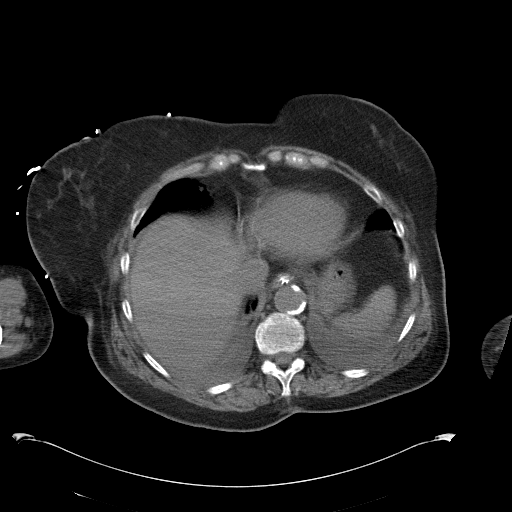
[im 85/89  soft-tissue]
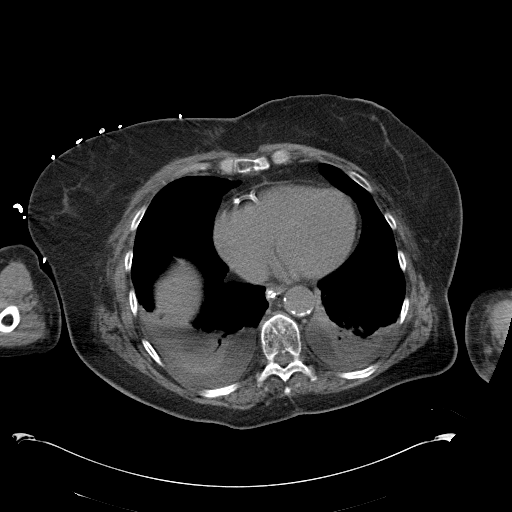

[Series 604: coronals · coronal · 0.88mm/px · 3 of 91 slices shown]
[im 31/91  soft-tissue]
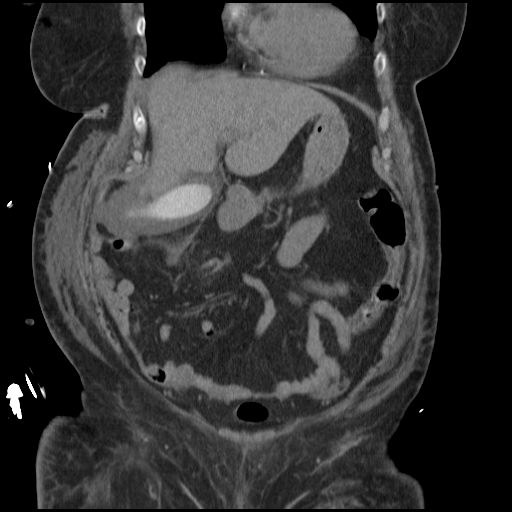
[im 41/91  soft-tissue]
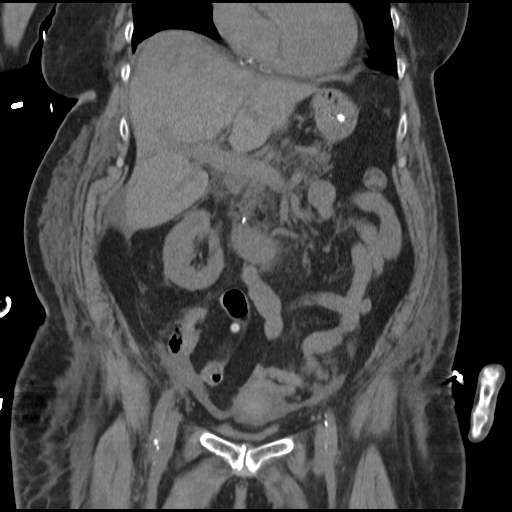
[im 51/91  soft-tissue]
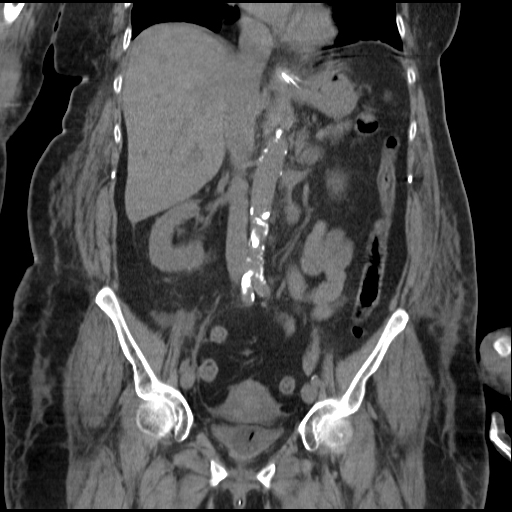

[16 of 46 positions shown; findings below may reference images not displayed]

FINDINGS: Small bilateral pleural effusions with associated
compressive atelectasis noted.  Nasogastric tube terminates within
the stomach.  Presumed vicarious excretion of contrast into the
gallbladder is noted.  There is low density, 5 HU, small amount of
fluid in the gallbladder fossa and also small amount of low density
pelvic free fluid.  There is a trace amount of fluid tracking
superiorly from the right groin along the anterior aspect of the
psoas muscle which measures fluid density. Small amount of
bilateral flank subcutaneous soft tissue edema or stranding is
noted.  Small fat containing left inguinal hernia.  No collection
is seen in either inguinal region.

Unenhanced liver, right adrenal gland, kidneys, spleen, and
pancreas are unremarkable.  There is a 1.1 cm mass like lesion in
the left adrenal gland measuring 14 HU, compatible with an adenoma.
No free air.

No bowel wall thickening or focal segmental dilatation.  Fat
containing umbilical hernia noted. Bladder is decompressed with a
Foley catheter in place.  Uterus and ovaries are normal.  No acute
osseous finding. Disc degenerative changes are noted in the lumbar
spine, including 4 mm anterolisthesis of L4 on L5.
IMPRESSION: Trace low density fluid extending from the right groin along the
anterior margin of the psoas muscle.  Although it is difficult to
obtain accurate Hounsfield unit measurements to determine the
composition of the fluid, areas measured are lower than expected
for blood products, and if this is a retroperitoneal hematoma, it
is extremely small.

Small amount of fluid around the gallbladder, of unclear etiology.
This may be seen with systemic hypoproteinemia, cholecystitis, or
occasionally liver disease.

No small amount of subcutaneous edema/stranding.

Small bilateral pleural effusions.  Constellation of findings
suggests mild third spacing and possible volume overload.

## 2012-11-01 MED ORDER — PRO-STAT SUGAR FREE PO LIQD
60.0000 mL | Freq: Two times a day (BID) | ORAL | Status: DC
  Administered 2012-11-01 – 2012-11-04 (×7): 60 mL
  Filled 2012-11-01 (×9): qty 60

## 2012-11-01 MED ORDER — ADULT MULTIVITAMIN LIQUID CH
5.0000 mL | Freq: Every day | ORAL | Status: DC
  Administered 2012-11-01 – 2012-11-04 (×4): 5 mL
  Filled 2012-11-01 (×5): qty 5

## 2012-11-01 MED ORDER — JEVITY 1.2 CAL PO LIQD
1000.0000 mL | ORAL | Status: DC
  Administered 2012-11-01: 1000 mL
  Filled 2012-11-01 (×2): qty 1000

## 2012-11-01 MED FILL — Dextrose Inj 5%: INTRAVENOUS | Qty: 50 | Status: AC

## 2012-11-01 NOTE — Progress Notes (Signed)
PULMONARY  / CRITICAL CARE MEDICINE  Name: Samantha King MRN: [id number] DOB: [birth year]    LOS: 3  REFERRING MD :  Clifton James  CHIEF COMPLAINT:  Respiratory failure   BRIEF PATIENT DESCRIPTION:  66 year old female smoker admitted on 12/20 after VF arrest in setting of  STEMI. Had < 1 minute of CPR. Was oriented on arrival to ER. Went to cath lab where was found to have occluded RCA. Developed shock and hypoperfusion during case. Intubated. DES placed in RCA. IABP placed, PCCM asked to see.   LINES / TUBES: oett 12/20>>> Right fem venous sheath 12/20>>> Right fem IABP 12/20>>> Left art sheath 12/20>>>  CULTURES: Sputum 12/20>>>no org seen on gram stain>>> Influenza PCR 12/20>>>Pos influenza A, H1N1 Urine strep 12/20>>>neg Urine legionella 12/20>>>  ANTIBIOTICS: tamiflu 12/20(H1N1 pos)>>> unasyn 12/21 (CAP)>>  SIGNIFICANT EVENTS:  Cardiac cath 12/20: Occluded proximal RCA now s/p PTCA/DES x 1   LEVEL OF CARE:  ICU  PRIMARY SERVICE:  PCCM CONSULTANTS:  PCCM  CODE STATUS: full  DIET: start TF 12/22 DVT Px:  integrilin  GI Px:  PPI    INTERVAL HISTORY:   on drips, trapping air on vent . Short runs of Vtach   VITAL SIGNS: Temp:  [98.6 F (37 C)-100.8 F (38.2 C)] 98.8 F (37.1 C) (12/23 0800) Pulse Rate:  [55-78] 57  (12/23 0800) Resp:  [11-24] 12  (12/23 0800) BP: (73-115)/(47-74) 115/74 mmHg (12/23 0800) SpO2:  [86 %-100 %] 96 % (12/23 0800) Arterial Line BP: (84-136)/(50-93) 133/93 mmHg (12/23 0800) FiO2 (%):  [40 %] 40 % (12/23 0759) Weight:  [84.4 kg (186 lb 1.1 oz)] 84.4 kg (186 lb 1.1 oz) (12/23 0449) HEMODYNAMICS: CVP:  [12 mmHg-15 mmHg] 13 mmHg VENTILATOR SETTINGS: Vent Mode:  [-] PRVC FiO2 (%):  [40 %] 40 % Set Rate:  [12 bmp] 12 bmp Vt Set:  [650 mL] 650 mL PEEP:  [5 cmH20] 5 cmH20 Plateau Pressure:  [17 cmH20-28 cmH20] 17 cmH20 With reduction of rate to12 and decrease ITime to .70 Vee reduces to 1-2 and eliminates autopeep INTAKE /  OUTPUT: Intake/Output      12/22 0701 - 12/23 0700 12/23 0701 - 12/24 0700   I.V. (mL/kg) 1910.6 (22.6) 58.9 (0.7)   NG/GT 300 20   IV Piggyback 160    Total Intake(mL/kg) 2370.6 (28.1) 78.9 (0.9)   Urine (mL/kg/hr) 1375 (0.7)    Emesis/NG output 0    Total Output 1375    Net +995.6 +78.9          PHYSICAL EXAMINATION: General:  Acutely ill appearing white female  Neuro:  Non focal, localizes pain and looks at it. HEENT:  Orally intubated  Cardiovascular:  Regular rhythm on tele.  Lungs:  Prolonged exp phase Abdomen:  Soft, mottled over abd  Musculoskeletal:  Skin cool and mottled. Weak pulses  Skin:  Cool and mottled   LABS: Cbc  Lab 11/01/12 0415 10/31/12 0357 10/30/12 0410  WBC 16.5* -- --  HGB 8.9* 10.5* 11.8*  HCT 26.5* 31.3* 35.5*  PLT 201 227 271   Chemistry  Lab 11/01/12 0415 10/31/12 1735 10/31/12 0357 10/30/12 2155 10/29/12 1330 10/29/12 1215  NA 138 137 137 -- -- --  K 4.5 4.4 4.0 -- -- --  CL 109 108 107 -- -- --  CO2 20 20 20  -- -- --  BUN 8 8 10  -- -- --  CREATININE 0.40* 0.50 0.51 -- -- --  CALCIUM 8.3* 8.5 8.2* -- -- --  MG -- -- -- 1.5 1.6 1.5  PHOS -- -- -- -- -- --  GLUCOSE 177* 157* 128* -- -- --   Liver fxn  Lab 10/30/12 0410 10/29/12 1215 10/29/12 1030  AST 355* -- 33  ALT 88* -- 28  ALKPHOS 76 89 86  BILITOT 0.2* -- 0.1*  PROT 5.3* -- 6.0  ALBUMIN 2.8* -- 3.1*   coags  Lab 10/29/12 1030  APTT --  INR 1.03   Sepsis markers  Lab 10/31/12 0357 10/30/12 0500 10/30/12 0420 10/29/12 1300 10/29/12 1231  LATICACIDVEN -- -- 1.6 3.0* --  PROCALCITON 0.64 1.19 -- -- <0.10   Cardiac markers  Lab 10/31/12 0300 10/30/12 2115 10/30/12 1530 10/29/12 1030  CKTOTAL -- -- -- 81  CKMB -- -- -- 4.1*  TROPONINI >20.00* >20.00* >20.00* --   BNP  Lab 10/30/12 0410  PROBNP 1268.0*   ABG  Lab 10/31/12 0423 10/30/12 0942 10/30/12 0831  PHART 7.389 7.431 7.471*  PCO2ART 34.8* 33.3* 28.3*  PO2ART 101.0* 139.0* 156.0*  HCO3 20.4 21.8  20.3  TCO2 21.5 22.8 21   CBG trend  Lab 11/01/12 0729 10/31/12 2334 10/31/12 2012 10/31/12 1642 10/31/12 1200  GLUCAP 170* 169* 155* 152* 113*   IMAGING:  12/22 CXR:  L>R edema. ?infiltrate LLL  DIAGNOSES: Active Problems:  Hypertension  GERD (gastroesophageal reflux disease)  Acute respiratory failure  Cardiogenic shock  Cardiac arrest  ST elevation myocardial infarction (STEMI) of inferior wall  H1N1 influenza with pneumonia  Acute blood loss anemia  ASSESSMENT / PLAN:  PULMONARY  ASSESSMENT: Acute respiratory failure Mostly in the setting of shock and hypoperfusion.  H1N1 and prob PNA LLL PLAN:   Full vent support, until hemodynamically more stable. Maintain current vent settings. Will need diureses prior to serious consideration for extubation. Intermittent sedation protocol  Continue steroids d/t bronchospasm solumedrol 40 q12. Change albuterol to xopenex d/t Vtach runs.  CARDIOVASCULAR  ASSESSMENT:  Acute anterior STEMI  S/p cardiac cath with occluded RCA. s/p PTCA/DES X1 on 12/20 Cardiogenic shock CHB on multiple pressors and IABP.  Runs of Vtach  PLAN:  Per cards. IABP out. Amiodarone per cardiology. Continue levophed for BP support. D/C diuretics for now.  RENAL  ASSESSMENT:  Metabolic acidosis (positive anion gap c/w lactic acidosis) resolved.  PLAN:   Monitor bmet   GASTROINTESTINAL  ASSESSMENT:   No acute issue PLAN:   Continue TFs SUP  HEMATOLOGIC  ASSESSMENT:   History of stomach ulcers, Hg loss, likely from GI source. PLAN:  - Serial CBCs - Integrillen and effient per cards. - Abdominal CT. - Hemoccult GI content. - Double protonix.  INFECTIOUS  ASSESSMENT:   H1N1 POS.  - on tamiflu prob LLL PNA  PLAN:   Cont tamiflu BID. Cont unasyn.  ENDOCRINE  ASSESSMENT:   Hyperglycemia  PLAN:   cbgs q 4  NEUROLOGIC  ASSESSMENT:  pain   PLAN:   Cont sedation -minimise versed, ok to use fent gtt  hypothermia  protocol deferred  GLOBAL - updated husband at bedside   I have personally obtained a history, examined the patient, evaluated laboratory and imaging results, formulated the assessment and plan and placed orders.  CRITICAL CARE: The patient is critically ill with multiple organ systems failure and requires high complexity decision making for assessment and support, frequent evaluation and titration of therapies, application of advanced monitoring technologies and extensive interpretation of multiple databases. Critical Care Time devoted to patient care services described in this note is 40  minutes.   Alyson Reedy, M.D. [facility] Pulmonary/Critical Care Medicine. Pager: [phone]. After hours pager: [phone].

## 2012-11-01 NOTE — Progress Notes (Signed)
Respiratory therapy note- Arterial assessed, no flush and no draw, confirmed re-insertion with Dr. Ignacia Marvel. Successful in left radial artery.

## 2012-11-01 NOTE — Procedures (Signed)
Arterial Catheter Insertion Procedure Note Samantha King [id number] 23-Nov-1945  Procedure: Insertion of Arterial Catheter  Indications: Blood pressure monitoring  Procedure Details Consent: Risks of procedure as well as the alternatives and risks of each were explained to the (patient/caregiver).  Consent for procedure obtained. Time Out: Verified patient identification, verified procedure, site/side was marked, verified correct patient position, special equipment/implants available, medications/allergies/relevent history reviewed, required imaging and test results available.  Performed  Maximum sterile technique was used including antiseptics. Skin prep: Chlorhexidine; local anesthetic administered 20 gauge catheter was inserted into left radial artery using the Seldinger technique.  Evaluation Blood flow good; BP tracing good. Complications: No apparent complications.   Newt Lukes 11/01/2012

## 2012-11-01 NOTE — Progress Notes (Signed)
Subjective:   Samantha King is a 66 yo female smoker with no prior cardiac history. Admitted 12/20 with inferior STEMI/VF arrest in setting of influenza. Treated with PCI/DES to RCA. 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 Now intubated on IABP.  ECHO EF ~50%. Inferior/posterior HK. RV looks ok.  ECG: Low volts. Prominent inferior Qs.   IABP pulled yesterday. Remains on vent - sedated.On levophed and dopamine. Trying to wean this am. VT quiescent on IV amio.  Making good urine. CVP 13-14. Co-ox 74%.   Hgb down from 10.5->8.9. + coffee grounds in NG tube. Protonix increased yesterday.    Intake/Output Summary (Last 24 hours) at 11/01/12 0813 Last data filed at 11/01/12 0700  Gross per 24 hour  Intake 2286.3 ml  Output   1250 ml  Net 1036.3 ml    Current meds:    . ampicillin-sulbactam (UNASYN) IV  1.5 g Intravenous Q8H  . antiseptic oral rinse  15 mL Mouth Rinse QID  . aspirin  81 mg Per NG tube Daily  . atorvastatin  80 mg Per NG tube q1800  . chlorhexidine  15 mL Mouth Rinse BID  . enoxaparin (LOVENOX) injection  40 mg Subcutaneous Q24H  . feeding supplement (JEVITY 1.2 CAL)  1,000 mL Per Tube Daily  . furosemide  20 mg Intravenous Once  . insulin aspart  2-6 Units Subcutaneous Q4H  . insulin glargine  10 Units Subcutaneous Q24H  . levalbuterol  0.63 mg Nebulization Q6H  . methylPREDNISolone (SOLU-MEDROL) injection  40 mg Intravenous Q12H  . oseltamivir  75 mg Oral BID  . pantoprazole (PROTONIX) IV  40 mg Intravenous Q12H  . prasugrel  10 mg Per NG tube Daily  . sodium chloride  3 mL Intravenous Q12H   Infusions:    . sodium chloride 20 mL/hr at 11/01/12 0001  . amiodarone (NEXTERONE PREMIX) 360 mg/200 mL dextrose 30 mg/hr (11/01/12 0103)  . dextrose    . DOPamine 3 mcg/kg/min (10/31/12 1200)  . fentaNYL infusion INTRAVENOUS 150 mcg/hr (11/01/12 0500)  . midazolam (VERSED) infusion 2 mg/hr (11/01/12 0500)    . norepinephrine (LEVOPHED) Adult infusion 8 mcg/min (11/01/12 0600)     Objective:  Blood pressure 110/68, pulse 59, temperature 98.8 F (37.1 C), temperature source Oral, resp. rate 12, height 5\' 2"  (1.575 m), weight 84.4 kg (186 lb 1.1 oz), SpO2 96.00%. Weight change: -0.1 kg (-3.5 oz)  Physical Exam: General:  Intubated. Sedated. Critically ill.  HEENT: normal Neck: supple. JVP up. Carotids 2+ bilat; no bruits. No lymphadenopathy or thryomegaly appreciated. Cor: PMI nondisplaced. Regular rate & rhythm. No rubs, gallops or murmurs. Lungs: mild wheeze Abdomen: soft, nontender, + mildly distended. Hypoactive bowel sounds. Extremities: no cyanosis, clubbing, rash, 1+ edema. groin sites ok. Neuro: sedated. Non-focal  Telemetry: SR occasional NSVT  Lab Results: Basic Metabolic Panel:  Lab 11/01/12 4098 10/31/12 1735 10/31/12 0357 10/30/12 2155 10/30/12 1600 10/29/12 1330 10/29/12 1215  NA 138 137 137 141 140 -- --  K 4.5 4.4 -- -- -- -- --  CL 109 108 107 110 110 -- --  CO2 20 20 20 20 21  -- --  GLUCOSE 177* 157* 128* 116* 113* -- --  BUN 8 8 10 13 14  -- --  CREATININE 0.40* 0.50 0.51 0.54 0.54 -- --  CALCIUM 8.3* 8.5 8.2* 8.3* 7.9* -- --  MG -- -- -- 1.5 -- 1.6 1.5  PHOS -- -- -- -- -- -- --  Liver Function Tests:  Lab 10/30/12 0410 10/29/12 1215 10/29/12 1030  AST 355* -- 33  ALT 88* -- 28  ALKPHOS 76 89 86  BILITOT 0.2* -- 0.1*  PROT 5.3* -- 6.0  ALBUMIN 2.8* -- 3.1*   No results found for this basename: LIPASE:5,AMYLASE:5 in the last 168 hours No results found for this basename: AMMONIA:5 in the last 168 hours CBC:  Lab 11/01/12 0415 10/31/12 0357 10/30/12 0410 10/29/12 2150 10/29/12 1215 10/29/12 1030  WBC 16.5* 18.9* 18.5* 22.5* 25.4* --  NEUTROABS -- -- -- -- -- 10.7*  HGB 8.9* 10.5* 11.8* 12.6 13.3 --  HCT 26.5* 31.3* 35.5* 38.0 39.6 --  MCV 94.3 93.2 92.9 93.8 92.5 --  PLT 201 227 271 286 305 --   Cardiac Enzymes:  Lab 10/31/12 0300 10/30/12 2115  10/30/12 1530 10/30/12 0910 10/29/12 1855 10/29/12 1030  CKTOTAL -- -- -- -- -- 81  CKMB -- -- -- -- -- 4.1*  CKMBINDEX -- -- -- -- -- --  TROPONINI >20.00* >20.00* >20.00* >20.00* >20.00* --   BNP: No components found with this basename: POCBNP:5 CBG:  Lab 11/01/12 0729 10/31/12 2334 10/31/12 2012 10/31/12 1642 10/31/12 1200  GLUCAP 170* 169* 155* 152* 113*   Microbiology: Lab Results  Component Value Date   CULT Non-Pathogenic Oropharyngeal-type Flora Isolated. 10/29/2012    Lab 10/29/12 1315 10/29/12 1241  CULT Non-Pathogenic Oropharyngeal-type Flora Isolated. --  SDES TRACHEAL ASPIRATE URINE, CATHETERIZED    Imaging: Dg Chest Port 1 View  11/01/2012  *RADIOLOGY REPORT*  Clinical Data: Respiratory distress.  PORTABLE CHEST - 1 VIEW  Comparison: 10/31/2012.  Findings: The endotracheal tube, NG tube and right IJ catheters are stable.  The IABP has been removed.  The cardiac silhouette, mediastinal and hilar contours are stable.  There is vascular congestion and slight increase and bibasilar atelectasis.  Probable small pleural effusions.  IMPRESSION:  1.  Stable support apparatus except the IABP has been removed. 2.  Slight increase in vascular congestion and bibasilar atelectasis.   Original Report Authenticated By: Rudie Meyer, M.D.    Dg Chest Port 1 View  10/31/2012  *RADIOLOGY REPORT*  Clinical Data: Cough, congestion, flu  PORTABLE CHEST - 1 VIEW  Comparison: 10/30/2012; 10/29/2012  Findings: Grossly unchanged cardiac silhouette and mediastinal contours.  Stable position of support apparatus including tip of intra-aortic balloon pump approximately 4 cm from the superior aspect of the aortic arch.  Pulmonary vasculature remains indistinct, left greater than right.  Grossly unchanged bibasilar opacities, left greater than right.  No new focal airspace opacity. No definite pleural effusion, though note, the left costophrenic angle is excluded view.  No pneumothorax.  Unchanged  bones.  IMPRESSION: 1.  Stable positioning of support apparatus.  No pneumothorax. 2.  Grossly unchanged findings most suggestive of asymmetric pulmonary edema.   Original Report Authenticated By: Tacey Ruiz, MD      ASSESSMENT:  1. Inferior STEMI - s/p PCI/DES to RCA on 12/20 2. VF arrest 3. Cardiogenic shock 4. VDRF 5. Influenza 6. Severe metabolic acidosis - improving 7. Hypokalemia 8. COPD 9. NSVT 10. Acute blood loss anemia - unclear source  PLAN/DISCUSSION:  Despite relatively normal EF on echo - she has had a big inferior/possible RV infarct.  Remains quite tenuous though hemodynamics improving some. Will continue to wean inotropes as tolerated. CVP up will also diurese as tolerated but given RV infarct physiology will need to be very careful with this. Continue Effient. No ace-i or b-blocker due  to shock. Will continue amio for VT.  Continue rx for flu.  Source of blood loss unclear. Will recheck CBC at noon. Check non-contrast CT ab/pelvis exclude RP bleed.  Hopefully we can wean vent today or tomorrow. Will discuss steroid wean with CCM.   The patient is critically ill with multiple organ systems failure and requires high complexity decision making for assessment and support, frequent evaluation and titration of therapies, application of advanced monitoring technologies and extensive interpretation of multiple databases.   Critical Care Time devoted to patient care services described in this note is 35 Minutes.    LOS: 3 days    Arvilla Meres, MD 11/01/2012, 8:13 AM

## 2012-11-01 NOTE — Progress Notes (Signed)
INITIAL NUTRITION ASSESSMENT  DOCUMENTATION CODES Per approved criteria  -Obesity Unspecified   INTERVENTION: Initiate continuous Jevity 1.2 @ 20 ml/hr via OG tube increase 10 ml q 4 hrs to goal rate of 30 ml/hr. Add 60 ml ProStat BID .  At goal rate, tube feeding regimen will provide 1264 kcal, 100 grams of protein, and 581 ml of H2O.    NUTRITION DIAGNOSIS: Inadequate oral intake related to inability to eat as evidenced by NPO status.   Goal: Enteral nutrition to provide 60-70% of estimated calorie needs (22-25 kcals/kg ideal body weight) and >/= 90% of estimated protein needs, based on ASPEN guidelines for permissive underfeeding in critically ill obese individuals.   Monitor:  Monitor tolerance of TF and transition to oral intake when medically appropriate  Reason for Assessment: TF initiation and management  66 y.o. female  Admitting Dx: Inferior STEMI/VF arrest  In setting of influenza   ASSESSMENT: Patient is currently intubated on ventilator support.  MV: 8.5  Temp:Temp (24hrs), Avg:99.5 F (37.5 C), Min:98.6 F (37 C), Max:100.8 F (38.2 C)   Height: Ht Readings from Last 1 Encounters:  10/29/12 5\' 2"  (1.575 m)    Weight: Wt Readings from Last 1 Encounters:  11/01/12 186 lb 1.1 oz (84.4 kg)    Ideal Body Weight: 110# (50kg)  % Ideal Body Weight:   Wt Readings from Last 10 Encounters:  11/01/12 186 lb 1.1 oz (84.4 kg)  11/01/12 186 lb 1.1 oz (84.4 kg)    Usual Body Weight: unknown   BMI:  Body mass index is 34.03 kg/(m^2).Obesity Class II  Estimated Nutritional Needs: Kcal: 1100-1200 kcal/day Protein: 100 gr/day Fluid: >1500 ml/day  Skin: no issues noted  Diet Order:  NPO   EDUCATION NEEDS: -No education needs identified at this time   Intake/Output Summary (Last 24 hours) at 11/01/12 0948 Last data filed at 11/01/12 0800  Gross per 24 hour  Intake 2230.9 ml  Output   1250 ml  Net  980.9 ml    Last BM: PTA  Labs:   Lab  11/01/12 0415 10/31/12 1735 10/31/12 0357 10/30/12 2155 10/29/12 1330 10/29/12 1215  NA 138 137 137 -- -- --  K 4.5 4.4 4.0 -- -- --  CL 109 108 107 -- -- --  CO2 20 20 20  -- -- --  BUN 8 8 10  -- -- --  CREATININE 0.40* 0.50 0.51 -- -- --  CALCIUM 8.3* 8.5 8.2* -- -- --  MG -- -- -- 1.5 1.6 1.5  PHOS -- -- -- -- -- --  GLUCOSE 177* 157* 128* -- -- --    CBG (last 3)   Basename 11/01/12 0729 10/31/12 2334 10/31/12 2012  GLUCAP 170* 169* 155*    Scheduled Meds:   . ampicillin-sulbactam (UNASYN) IV  1.5 g Intravenous Q8H  . antiseptic oral rinse  15 mL Mouth Rinse QID  . aspirin  81 mg Per NG tube Daily  . atorvastatin  80 mg Per NG tube q1800  . chlorhexidine  15 mL Mouth Rinse BID  . enoxaparin (LOVENOX) injection  40 mg Subcutaneous Q24H  . feeding supplement (JEVITY 1.2 CAL)  1,000 mL Per Tube Daily  . furosemide  20 mg Intravenous Once  . insulin aspart  2-6 Units Subcutaneous Q4H  . insulin glargine  10 Units Subcutaneous Q24H  . levalbuterol  0.63 mg Nebulization Q6H  . methylPREDNISolone (SOLU-MEDROL) injection  40 mg Intravenous Q12H  . oseltamivir  75 mg Oral BID  .  pantoprazole (PROTONIX) IV  40 mg Intravenous Q12H  . prasugrel  10 mg Per NG tube Daily  . sodium chloride  3 mL Intravenous Q12H    Continuous Infusions:   . sodium chloride 20 mL/hr at 11/01/12 0001  . amiodarone (NEXTERONE PREMIX) 360 mg/200 mL dextrose 30 mg/hr (11/01/12 0103)  . dextrose    . DOPamine 3 mcg/kg/min (10/31/12 1200)  . fentaNYL infusion INTRAVENOUS 150 mcg/hr (11/01/12 0500)  . midazolam (VERSED) infusion 2 mg/hr (11/01/12 0500)  . norepinephrine (LEVOPHED) Adult infusion 7 mcg/min (11/01/12 0800)    Past Medical History  Diagnosis Date  . Hypertension   . MI (myocardial infarction)   . GERD (gastroesophageal reflux disease)     History reviewed. No pertinent past surgical history.  #[phone]

## 2012-11-02 ENCOUNTER — Encounter (HOSPITAL_COMMUNITY): Payer: Self-pay

## 2012-11-02 ENCOUNTER — Inpatient Hospital Stay (HOSPITAL_COMMUNITY): Payer: Medicare Other

## 2012-11-02 DIAGNOSIS — R570 Cardiogenic shock: Secondary | ICD-10-CM | POA: Diagnosis not present

## 2012-11-02 DIAGNOSIS — J988 Other specified respiratory disorders: Secondary | ICD-10-CM | POA: Diagnosis not present

## 2012-11-02 DIAGNOSIS — I4901 Ventricular fibrillation: Secondary | ICD-10-CM | POA: Diagnosis not present

## 2012-11-02 DIAGNOSIS — I469 Cardiac arrest, cause unspecified: Secondary | ICD-10-CM | POA: Diagnosis not present

## 2012-11-02 DIAGNOSIS — R0989 Other specified symptoms and signs involving the circulatory and respiratory systems: Secondary | ICD-10-CM | POA: Diagnosis not present

## 2012-11-02 DIAGNOSIS — I2119 ST elevation (STEMI) myocardial infarction involving other coronary artery of inferior wall: Secondary | ICD-10-CM | POA: Diagnosis not present

## 2012-11-02 DIAGNOSIS — D62 Acute posthemorrhagic anemia: Secondary | ICD-10-CM | POA: Diagnosis not present

## 2012-11-02 DIAGNOSIS — R4182 Altered mental status, unspecified: Secondary | ICD-10-CM | POA: Diagnosis not present

## 2012-11-02 DIAGNOSIS — J9600 Acute respiratory failure, unspecified whether with hypoxia or hypercapnia: Secondary | ICD-10-CM | POA: Diagnosis not present

## 2012-11-02 DIAGNOSIS — Z4682 Encounter for fitting and adjustment of non-vascular catheter: Secondary | ICD-10-CM | POA: Diagnosis not present

## 2012-11-02 LAB — BLOOD GAS, ARTERIAL
Acid-base deficit: 1.1 mmol/L (ref 0.0–2.0)
Bicarbonate: 23.7 mEq/L (ref 20.0–24.0)
FIO2: 0.4 %
MECHVT: 650 mL
TCO2: 25 mmol/L (ref 0–100)
pCO2 arterial: 43.6 mmHg (ref 35.0–45.0)
pH, Arterial: 7.354 (ref 7.350–7.450)

## 2012-11-02 LAB — COMPREHENSIVE METABOLIC PANEL
AST: 38 U/L — ABNORMAL HIGH (ref 0–37)
Albumin: 2.2 g/dL — ABNORMAL LOW (ref 3.5–5.2)
BUN: 19 mg/dL (ref 6–23)
CO2: 24 mEq/L (ref 19–32)
Calcium: 9.1 mg/dL (ref 8.4–10.5)
Chloride: 109 mEq/L (ref 96–112)
Creatinine, Ser: 0.56 mg/dL (ref 0.50–1.10)
GFR calc non Af Amer: >90 mL/min (ref >90)
Total Bilirubin: 0.2 mg/dL — ABNORMAL LOW (ref 0.3–1.2)

## 2012-11-02 LAB — BASIC METABOLIC PANEL
BUN: 22 mg/dL (ref 6–23)
CO2: 27 mEq/L (ref 19–32)
Chloride: 108 mEq/L (ref 96–112)
GFR calc non Af Amer: >90 mL/min (ref >90)
Glucose, Bld: 119 mg/dL — ABNORMAL HIGH (ref 70–99)
Potassium: 4.4 mEq/L (ref 3.5–5.1)
Sodium: 142 mEq/L (ref 135–145)

## 2012-11-02 LAB — GLUCOSE, CAPILLARY
Glucose-Capillary: 136 mg/dL — ABNORMAL HIGH (ref 70–99)
Glucose-Capillary: 162 mg/dL — ABNORMAL HIGH (ref 70–99)
Glucose-Capillary: 122 mg/dL — ABNORMAL HIGH (ref 70–99)
Glucose-Capillary: 136 mg/dL — ABNORMAL HIGH (ref 70–99)
Glucose-Capillary: 118 mg/dL — ABNORMAL HIGH (ref 70–99)
Glucose-Capillary: 123 mg/dL — ABNORMAL HIGH (ref 70–99)

## 2012-11-02 LAB — CBC
HCT: 30.7 % — ABNORMAL LOW (ref 36.0–46.0)
MCH: 31.6 pg (ref 26.0–34.0)
MCV: 94.2 fL (ref 78.0–100.0)
Platelets: 245 10*3/uL (ref 150–400)
RDW: 15.1 % (ref 11.5–15.5)

## 2012-11-02 LAB — PHOSPHORUS: Phosphorus: 2.4 mg/dL (ref 2.3–4.6)

## 2012-11-02 LAB — MAGNESIUM: Magnesium: 2.2 mg/dL (ref 1.5–2.5)

## 2012-11-02 LAB — CARBOXYHEMOGLOBIN
O2 Saturation: 79.9 %
Total hemoglobin: 10.6 g/dL — ABNORMAL LOW (ref 12.0–16.0)

## 2012-11-02 IMAGING — CT CT HEAD W/O CM
1 of 2 series · 13 of 30 positions shown, 17 images · non-contrast
Comparison: None.

CLINICAL DATA: Altered mental status.

CT HEAD WITHOUT CONTRAST
TECHNIQUE: Contiguous axial images were obtained from the base of
the skull through the vertex without contrast.

[Series 1: — · axial · 0.49mm/px · z∈[-415,-265]mm · 13 of 36 slices shown, 17 images]
[im 3/36  brain]
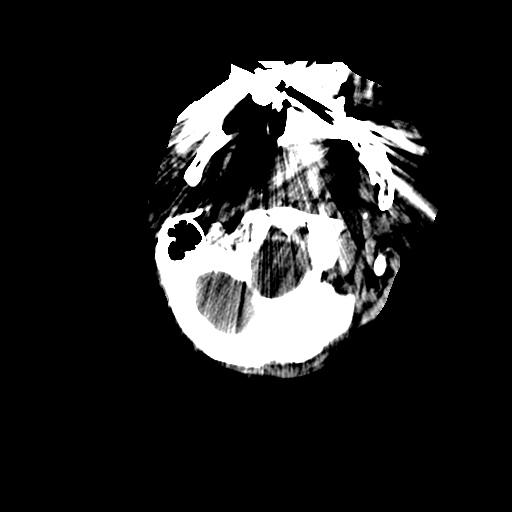
[im 3/36  bone]
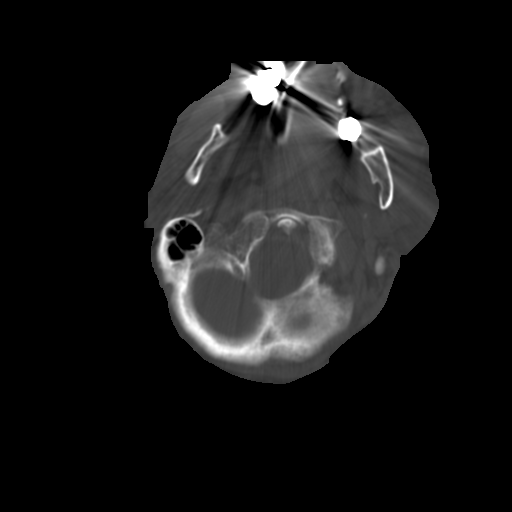
[im 6/36  brain]
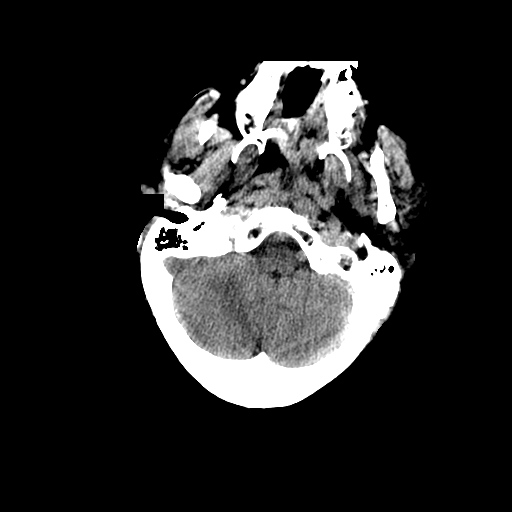
[im 8/36  brain]
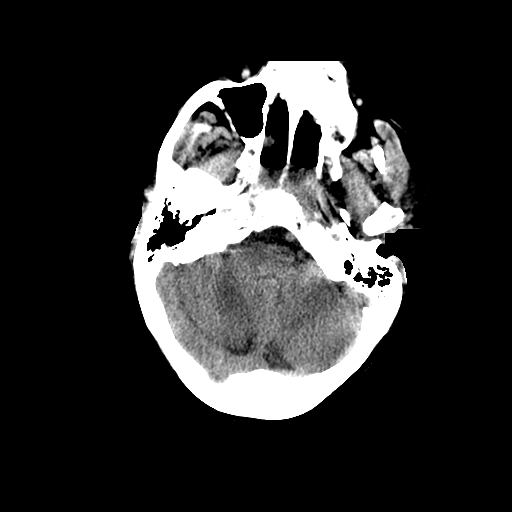
[im 11/36  brain]
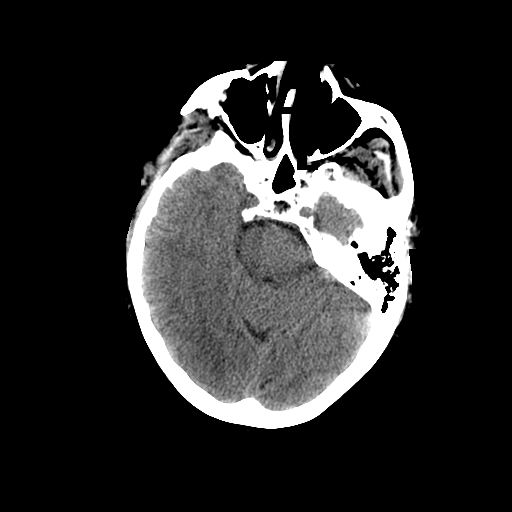
[im 13/36  brain]
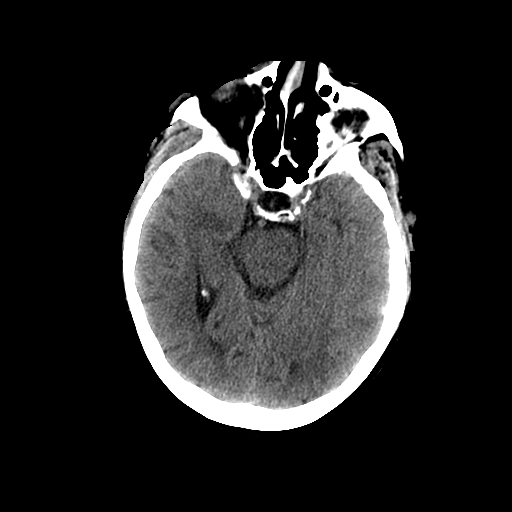
[im 13/36  bone]
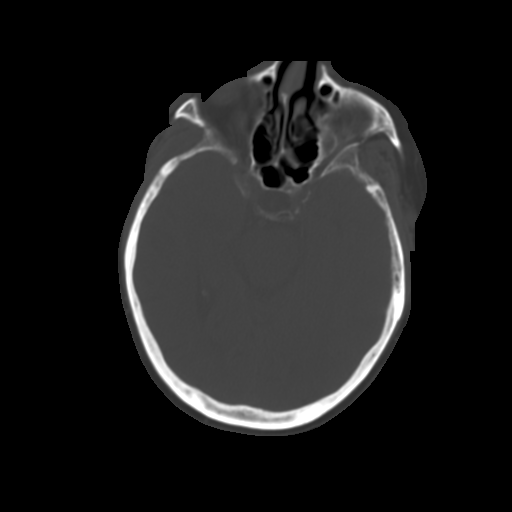
[im 16/36  brain]
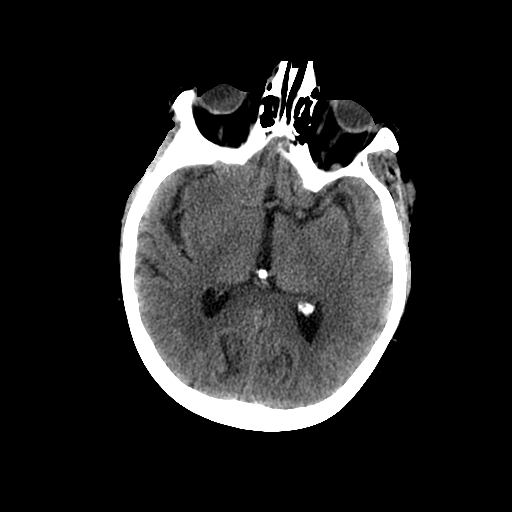
[im 18/36  brain]
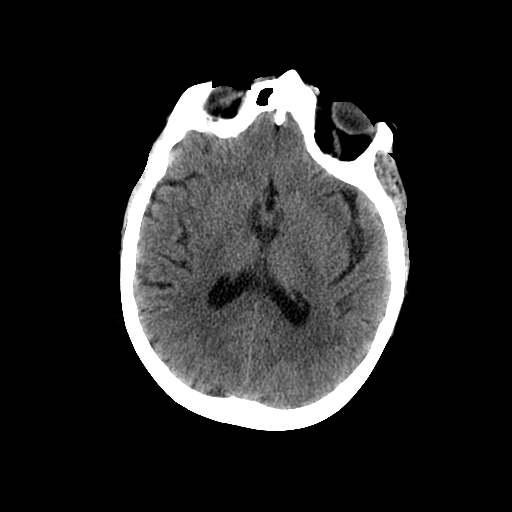
[im 21/36  brain]
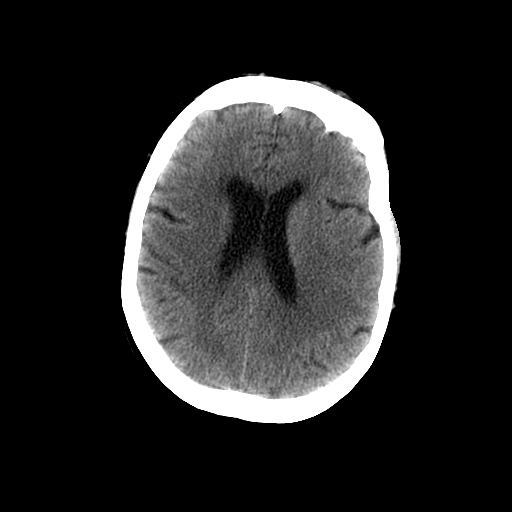
[im 23/36  brain]
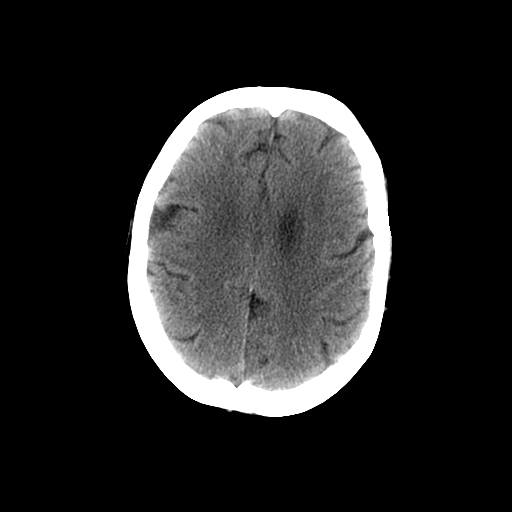
[im 23/36  bone]
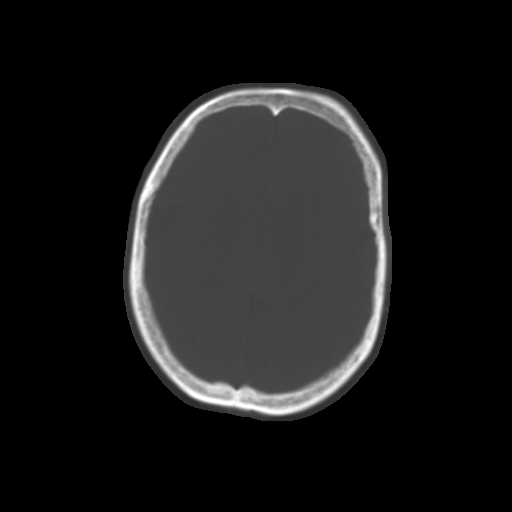
[im 26/36  brain]
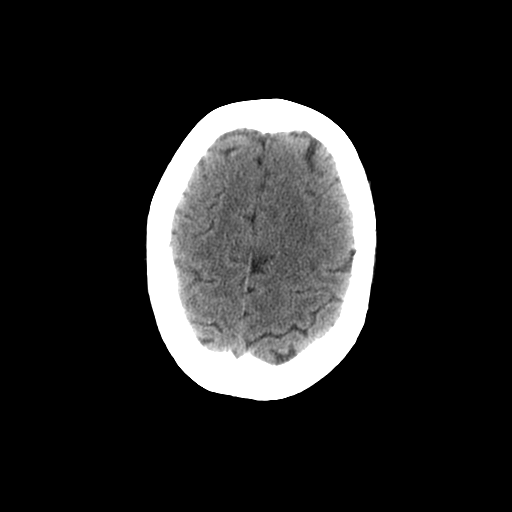
[im 28/36  brain]
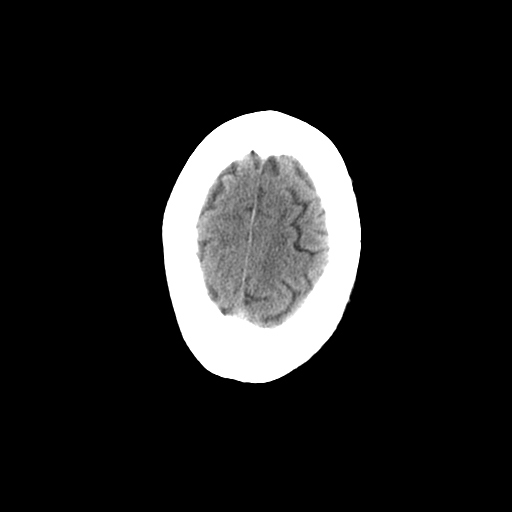
[im 31/36  brain]
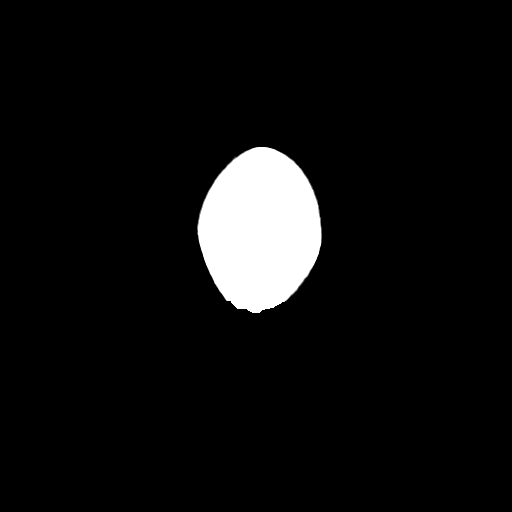
[im 33/36  brain]
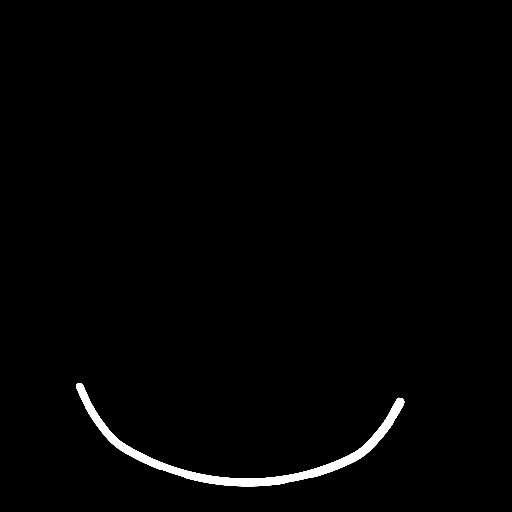
[im 33/36  bone]
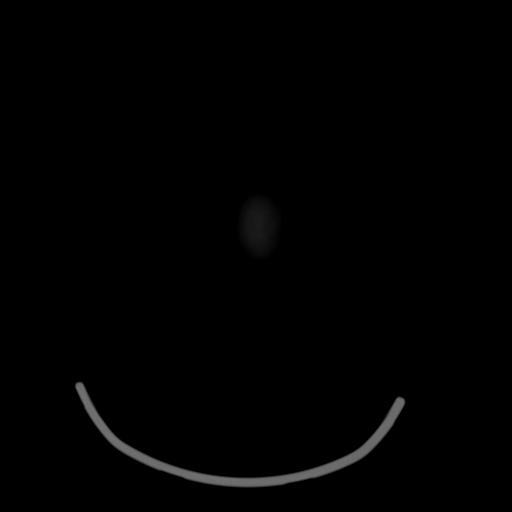

[13 of 30 positions shown; findings below may reference images not displayed]

FINDINGS: No mass lesion, mass effect, midline shift,
hydrocephalus, hemorrhage.  No territorial ischemia or acute
infarction.  Allowing for portable technique, gray white
differentiation appears preserved.  Low attenuation adjacent to the
frontal horns of the lateral ventricles is compatible with chronic
ischemic disease. Fluid is present in the left mastoid air cells.
IMPRESSION: No acute intracranial abnormality.

## 2012-11-02 IMAGING — CR DG CHEST 1V PORT
1 series · 1 of 1 positions shown · non-contrast
Comparison: Yesterday

CLINICAL DATA: Respiratory difficulty

PORTABLE CHEST - 1 VIEW

[AP]
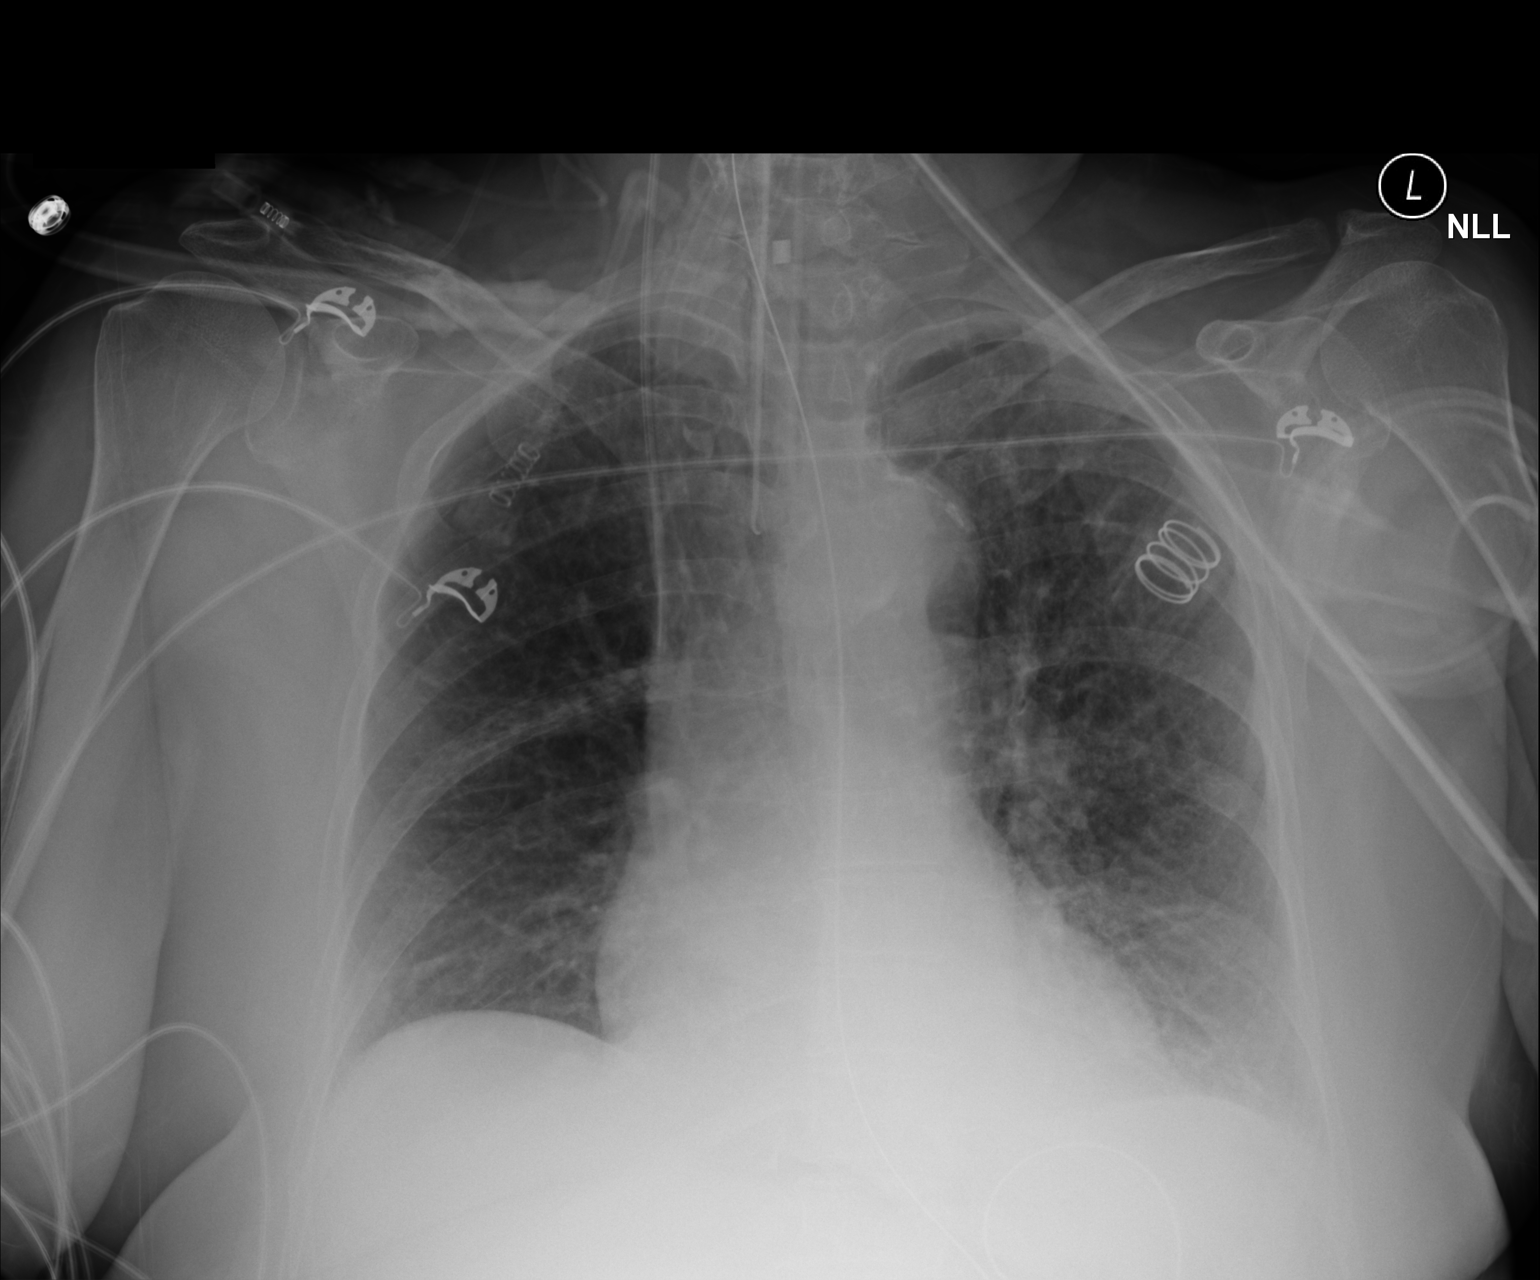

[1 of 1 positions shown; findings below may reference images not displayed]

FINDINGS: Stable tubular devices.  Vascular congestion improved.
Bibasilar airspace disease improved.  No pneumothorax.
IMPRESSION: Improved vascular congestion and bibasilar airspace disease.

## 2012-11-02 MED ORDER — FUROSEMIDE 10 MG/ML IJ SOLN
INTRAMUSCULAR | Status: AC
  Filled 2012-11-02: qty 4

## 2012-11-02 MED ORDER — POTASSIUM CHLORIDE 20 MEQ/15ML (10%) PO LIQD
40.0000 meq | Freq: Once | ORAL | Status: AC
  Administered 2012-11-02: 40 meq
  Filled 2012-11-02: qty 30

## 2012-11-02 MED ORDER — POTASSIUM CHLORIDE 20 MEQ/15ML (10%) PO LIQD
ORAL | Status: AC
  Filled 2012-11-02: qty 30

## 2012-11-02 MED ORDER — FUROSEMIDE 10 MG/ML IJ SOLN
20.0000 mg | Freq: Four times a day (QID) | INTRAMUSCULAR | Status: AC
  Administered 2012-11-02 – 2012-11-03 (×2): 20 mg via INTRAVENOUS

## 2012-11-02 MED ORDER — FUROSEMIDE 10 MG/ML IJ SOLN
20.0000 mg | Freq: Once | INTRAMUSCULAR | Status: AC
  Administered 2012-11-02: 20 mg via INTRAVENOUS

## 2012-11-02 MED ORDER — JEVITY 1.2 CAL PO LIQD
1000.0000 mL | ORAL | Status: DC
  Administered 2012-11-02 – 2012-11-03 (×2): 1000 mL
  Filled 2012-11-02 (×5): qty 1000

## 2012-11-02 NOTE — Progress Notes (Signed)
Subjective:   Samantha King is a 66 yo female smoker with no prior cardiac history. Admitted 12/20 with inferior STEMI/VF arrest in setting of influenza. Treated with PCI/DES to RCA. 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 Now intubated on IABP.  ECHO EF ~50%. Inferior/posterior HK. RV looks ok.  ECG: Low volts. Prominent inferior Qs.   CT scan of ab - no RP bleed.  Remains on vent - waking up. Did well on wean this am. Levophed and dopamine off. VT quiescent on IV amio.  Making good urine. CVP 12-13. Co-ox 79%.   Hgb stable from 10.4-> 10.3.   Intake/Output Summary (Last 24 hours) at 11/02/12 0935 Last data filed at 11/02/12 0800  Gross per 24 hour  Intake 2062.2 ml  Output    980 ml  Net 1082.2 ml    Current meds:    . ampicillin-sulbactam (UNASYN) IV  1.5 g Intravenous Q8H  . antiseptic oral rinse  15 mL Mouth Rinse QID  . aspirin  81 mg Per NG tube Daily  . atorvastatin  80 mg Per NG tube q1800  . chlorhexidine  15 mL Mouth Rinse BID  . enoxaparin (LOVENOX) injection  40 mg Subcutaneous Q24H  . feeding supplement  60 mL Per Tube BID  . insulin aspart  2-6 Units Subcutaneous Q4H  . insulin glargine  10 Units Subcutaneous Q24H  . levalbuterol  0.63 mg Nebulization Q6H  . methylPREDNISolone (SOLU-MEDROL) injection  40 mg Intravenous Q12H  . multivitamin  5 mL Per Tube Daily  . oseltamivir  75 mg Oral BID  . pantoprazole (PROTONIX) IV  40 mg Intravenous Q12H  . prasugrel  10 mg Per NG tube Daily  . sodium chloride  3 mL Intravenous Q12H   Infusions:    . sodium chloride 20 mL/hr at 11/01/12 0001  . amiodarone (NEXTERONE PREMIX) 360 mg/200 mL dextrose 30 mg/hr (11/02/12 0234)  . dextrose    . DOPamine 3 mcg/kg/min (11/01/12 1300)  . feeding supplement (JEVITY 1.2 CAL) 1,000 mL (11/01/12 1409)  . fentaNYL infusion INTRAVENOUS 75 mcg/hr (11/01/12 2210)  . midazolam (VERSED) infusion 1 mg/hr (11/02/12 0737)    . norepinephrine (LEVOPHED) Adult infusion Stopped (11/02/12 0539)     Objective:  Blood pressure 148/79, pulse 76, temperature 98.2 F (36.8 C), temperature source Core (Comment), resp. rate 13, height 5\' 2"  (1.575 m), weight 88.2 kg (194 lb 7.1 oz), SpO2 98.00%. Weight change: 3.8 kg (8 lb 6 oz)  Physical Exam: General:  Intubated. Sedated.  HEENT: normal Neck: supple. JVP up. Carotids 2+ bilat; no bruits. No lymphadenopathy or thryomegaly appreciated. Cor: PMI nondisplaced. Regular rate & rhythm. No rubs, gallops or murmurs. Lungs: mild wheeze Abdomen: soft, nontender, + mildly distended. Hypoactive bowel sounds. Extremities: no cyanosis, clubbing, rash, 1+ edema. groin sites ok. Neuro: sedated. Non-focal  Telemetry: SR occasional NSVT  Lab Results: Basic Metabolic Panel:  Lab 11/02/12 4098 11/01/12 1555 11/01/12 0415 10/31/12 1735 10/31/12 0357 10/30/12 2155 10/29/12 1330 10/29/12 1215  NA 140 139 138 137 137 -- -- --  K 4.2 4.5 -- -- -- -- -- --  CL 109 109 109 108 107 -- -- --  CO2 24 23 20 20 20  -- -- --  GLUCOSE 168* 153* 177* 157* 128* -- -- --  BUN 19 11 8 8 10  -- -- --  CREATININE 0.56 0.44* 0.40* 0.50 0.51 -- -- --  CALCIUM 9.1 8.9 8.3* 8.5 8.2* -- -- --  MG 2.2 -- -- -- -- 1.5 1.6 1.5  PHOS 2.4 -- -- -- -- -- -- --   Liver Function Tests:  Lab 11/02/12 0415 10/30/12 0410 10/29/12 1215 10/29/12 1030  AST 38* 355* -- 33  ALT 33 88* -- 28  ALKPHOS 73 76 89 86  BILITOT 0.2* 0.2* -- 0.1*  PROT 5.3* 5.3* -- 6.0  ALBUMIN 2.2* 2.8* -- 3.1*   No results found for this basename: LIPASE:5,AMYLASE:5 in the last 168 hours No results found for this basename: AMMONIA:5 in the last 168 hours CBC:  Lab 11/02/12 0415 11/01/12 1200 11/01/12 0415 10/31/12 0357 10/30/12 0410 10/29/12 1030  WBC 20.6* 20.6* 16.5* 18.9* 18.5* --  NEUTROABS -- -- -- -- -- 10.7*  HGB 10.3* 10.4* 8.9* 10.5* 11.8* --  HCT 30.7* 31.8* 26.5* 31.3* 35.5* --  MCV 94.2 94.6 94.3 93.2 92.9 --   PLT 245 227 201 227 271 --   Cardiac Enzymes:  Lab 10/31/12 0300 10/30/12 2115 10/30/12 1530 10/30/12 0910 10/29/12 1855 10/29/12 1030  CKTOTAL -- -- -- -- -- 81  CKMB -- -- -- -- -- 4.1*  CKMBINDEX -- -- -- -- -- --  TROPONINI >20.00* >20.00* >20.00* >20.00* >20.00* --   BNP: No components found with this basename: POCBNP:5 CBG:  Lab 11/02/12 0359 11/02/12 0029 11/01/12 2022 11/01/12 1543 11/01/12 1214  GLUCAP 162* 136* 144* 146* 155*   Microbiology: Lab Results  Component Value Date   CULT Non-Pathogenic Oropharyngeal-type Flora Isolated. 10/29/2012    Lab 10/29/12 1315 10/29/12 1241  CULT Non-Pathogenic Oropharyngeal-type Flora Isolated. --  SDES TRACHEAL ASPIRATE URINE, CATHETERIZED    Imaging: Ct Abdomen Pelvis Wo Contrast  11/01/2012  *RADIOLOGY REPORT*  Clinical Data: Decreased hemoglobin, post cardiac catheterization 10/30/12 with bilateral femoral catheter placement.  CT ABDOMEN AND PELVIS WITHOUT CONTRAST  Technique:  Multidetector CT imaging of the abdomen and pelvis was performed following the standard protocol without intravenous contrast.  Comparison: None.  Findings: Small bilateral pleural effusions with associated compressive atelectasis noted.  Nasogastric tube terminates within the stomach.  Presumed vicarious excretion of contrast into the gallbladder is noted.  There is low density, 5 HU, small amount of fluid in the gallbladder fossa and also small amount of low density pelvic free fluid.  There is a trace amount of fluid tracking superiorly from the right groin along the anterior aspect of the psoas muscle which measures fluid density. Small amount of bilateral flank subcutaneous soft tissue edema or stranding is noted.  Small fat containing left inguinal hernia.  No collection is seen in either inguinal region.  Unenhanced liver, right adrenal gland, kidneys, spleen, and pancreas are unremarkable.  There is a 1.1 cm mass like lesion in the left adrenal gland  measuring 14 HU, compatible with an adenoma. No free air.  No bowel wall thickening or focal segmental dilatation.  Fat containing umbilical hernia noted. Bladder is decompressed with a Foley catheter in place.  Uterus and ovaries are normal.  No acute osseous finding. Disc degenerative changes are noted in the lumbar spine, including 4 mm anterolisthesis of L4 on L5.  IMPRESSION: Trace low density fluid extending from the right groin along the anterior margin of the psoas muscle.  Although it is difficult to obtain accurate Hounsfield unit measurements to determine the composition of the fluid, areas measured are lower than expected for blood products, and if this is a retroperitoneal hematoma, it is extremely small.  Small amount of fluid around the gallbladder, of  unclear etiology. This may be seen with systemic hypoproteinemia, cholecystitis, or occasionally liver disease.  No small amount of subcutaneous edema/stranding.  Small bilateral pleural effusions.  Constellation of findings suggests mild third spacing and possible volume overload.   Original Report Authenticated By: Christiana Pellant, M.D.    Dg Chest Port 1 View  11/02/2012  *RADIOLOGY REPORT*  Clinical Data: Respiratory difficulty  PORTABLE CHEST - 1 VIEW  Comparison: Yesterday  Findings: Stable tubular devices.  Vascular congestion improved. Bibasilar airspace disease improved.  No pneumothorax.  IMPRESSION: Improved vascular congestion and bibasilar airspace disease.   Original Report Authenticated By: Jolaine Click, M.D.    Dg Chest Port 1 View  11/01/2012  *RADIOLOGY REPORT*  Clinical Data: Respiratory distress.  PORTABLE CHEST - 1 VIEW  Comparison: 10/31/2012.  Findings: The endotracheal tube, NG tube and right IJ catheters are stable.  The IABP has been removed.  The cardiac silhouette, mediastinal and hilar contours are stable.  There is vascular congestion and slight increase and bibasilar atelectasis.  Probable small pleural effusions.   IMPRESSION:  1.  Stable support apparatus except the IABP has been removed. 2.  Slight increase in vascular congestion and bibasilar atelectasis.   Original Report Authenticated By: Rudie Meyer, M.D.      ASSESSMENT:  1. Inferior STEMI - s/p PCI/DES to RCA on 12/20 2. VF arrest 3. Cardiogenic shock 4. VDRF 5. Influenza 6. Severe metabolic acidosis - improving 7. Hypokalemia 8. COPD 9. NSVT 10. Acute blood loss anemia - unclear source  PLAN/DISCUSSION:  Despite relatively normal EF on echo - she seems to have had a big inferior/possible RV infarct.  Now off pressors. CVP still a bit high but may need that to support RV. Can try to diurese gently as needed to facilitate vent wean. Hopefully we can try to wean further and possibly extubate later today. Will give 20 lasix IV and see response. Await CCM input regarding extubation.  The patient is critically ill with multiple organ systems failure and requires high complexity decision making for assessment and support, frequent evaluation and titration of therapies, application of advanced monitoring technologies and extensive interpretation of multiple databases.   Critical Care Time devoted to patient care services described in this note is 35 Minutes.    LOS: 4 days    Arvilla Meres, MD 11/02/2012, 9:35 AM

## 2012-11-02 NOTE — Progress Notes (Signed)
PULMONARY  / CRITICAL CARE MEDICINE  Name: Samantha King MRN: [id number] DOB: July 23, 1946    LOS: 4  REFERRING MD :  Clifton James  CHIEF COMPLAINT:  Respiratory failure   BRIEF PATIENT DESCRIPTION:  66 year old female smoker admitted on 12/20 after VF arrest in setting of  STEMI. Had < 1 minute of CPR. Was oriented on arrival to ER. Went to cath lab where was found to have occluded RCA. Developed shock and hypoperfusion during case. Intubated. DES placed in RCA. IABP placed, PCCM asked to see.   LINES / TUBES: oett 12/20>>> Right fem venous sheath 12/20>>> Right fem IABP 12/20>>> Left art sheath 12/20>>>  CULTURES: Sputum 12/20>>>no org seen on gram stain>>> Influenza PCR 12/20>>>Pos influenza A, H1N1 Urine strep 12/20>>>neg Urine legionella 12/20>>>  ANTIBIOTICS: tamiflu 12/20(H1N1 pos)>>> unasyn 12/21 (CAP)>>  SIGNIFICANT EVENTS:  Cardiac cath 12/20: Occluded proximal RCA now s/p PTCA/DES x 1   LEVEL OF CARE:  ICU  PRIMARY SERVICE:  PCCM CONSULTANTS:  PCCM  CODE STATUS: full  DIET: start TF 12/22 DVT Px:  integrilin  GI Px:  PPI    INTERVAL HISTORY:   on drips, trapping air on vent . Short runs of Vtach   VITAL SIGNS: Temp:  [97.5 F (36.4 C)-98.6 F (37 C)] 98.2 F (36.8 C) (12/24 0800) Pulse Rate:  [55-120] 76  (12/24 0800) Resp:  [12-19] 13  (12/24 0800) BP: (92-176)/(55-95) 148/79 mmHg (12/24 0400) SpO2:  [91 %-98 %] 98 % (12/24 0800) Arterial Line BP: (67-167)/(43-86) 142/83 mmHg (12/24 0800) FiO2 (%):  [40 %] 40 % (12/24 0805) Weight:  [88.2 kg (194 lb 7.1 oz)] 88.2 kg (194 lb 7.1 oz) (12/24 0500) HEMODYNAMICS: CVP:  [12 mmHg-16 mmHg] 16 mmHg VENTILATOR SETTINGS: Vent Mode:  [-] PSV;CPAP FiO2 (%):  [40 %] 40 % Set Rate:  [12 bmp] 12 bmp Vt Set:  [650 mL] 650 mL PEEP:  [5 cmH20] 5 cmH20 Pressure Support:  [5 cmH20] 5 cmH20 Plateau Pressure:  [16 cmH20-19 cmH20] 16 cmH20 With reduction of rate to12 and decrease ITime to .70 Vee reduces to 1-2  and eliminates autopeep INTAKE / OUTPUT: Intake/Output      12/23 0701 - 12/24 0700 12/24 0701 - 12/25 0700   I.V. (mL/kg) 1105.6 (12.5) 85.4 (1)   NG/GT 850 60   IV Piggyback 150    Total Intake(mL/kg) 2105.6 (23.9) 145.4 (1.6)   Urine (mL/kg/hr) 855 (0.4) 125 (0.3)   Total Output 855 125   Net +1250.6 +20.4          PHYSICAL EXAMINATION: General:  Acutely ill appearing white female  Neuro:  Non focal, localizes pain and looks at it. HEENT:  Orally intubated  Cardiovascular:  Regular rhythm on tele.  Lungs:  Prolonged exp phase Abdomen:  Soft, mottled over abd  Musculoskeletal:  Skin cool and mottled. Weak pulses  Skin:  Cool and mottled   LABS: Cbc  Lab 11/02/12 0415 11/01/12 1200 11/01/12 0415  WBC 20.6* -- --  HGB 10.3* 10.4* 8.9*  HCT 30.7* 31.8* 26.5*  PLT 245 227 201   Chemistry  Lab 11/02/12 0415 11/01/12 1555 11/01/12 0415 10/30/12 2155 10/29/12 1330  NA 140 139 138 -- --  K 4.2 4.5 4.5 -- --  CL 109 109 109 -- --  CO2 24 23 20  -- --  BUN 19 11 8  -- --  CREATININE 0.56 0.44* 0.40* -- --  CALCIUM 9.1 8.9 8.3* -- --  MG 2.2 -- -- 1.5 1.6  PHOS 2.4 -- -- -- --  GLUCOSE 168* 153* 177* -- --   Liver fxn  Lab 11/02/12 0415 10/30/12 0410 10/29/12 1215 10/29/12 1030  AST 38* 355* -- 33  ALT 33 88* -- 28  ALKPHOS 73 76 89 --  BILITOT 0.2* 0.2* -- 0.1*  PROT 5.3* 5.3* -- 6.0  ALBUMIN 2.2* 2.8* -- 3.1*   coags  Lab 10/29/12 1030  APTT --  INR 1.03   Sepsis markers  Lab 10/31/12 0357 10/30/12 0500 10/30/12 0420 10/29/12 1300 10/29/12 1231  LATICACIDVEN -- -- 1.6 3.0* --  PROCALCITON 0.64 1.19 -- -- <0.10   Cardiac markers  Lab 10/31/12 0300 10/30/12 2115 10/30/12 1530 10/29/12 1030  CKTOTAL -- -- -- 81  CKMB -- -- -- 4.1*  TROPONINI >20.00* >20.00* >20.00* --   BNP  Lab 10/30/12 0410  PROBNP 1268.0*   ABG  Lab 11/02/12 0432 10/31/12 0423 10/30/12 0942  PHART 7.354 7.389 7.431  PCO2ART 43.6 34.8* 33.3*  PO2ART 73.0* 101.0* 139.0*   HCO3 23.7 20.4 21.8  TCO2 25.0 21.5 22.8   CBG trend  Lab 11/02/12 0359 11/02/12 0029 11/01/12 2022 11/01/12 1543 11/01/12 1214  GLUCAP 162* 136* 144* 146* 155*   Intake/Output Summary (Last 24 hours) at 11/02/12 1140 Last data filed at 11/02/12 0800  Gross per 24 hour  Intake 1772.4 ml  Output    805 ml  Net  967.4 ml   IMAGING:  12/22 CXR:  L>R edema. ?infiltrate LLL  DIAGNOSES: Active Problems:  Hypertension  GERD (gastroesophageal reflux disease)  Acute respiratory failure  Cardiogenic shock  Cardiac arrest  ST elevation myocardial infarction (STEMI) of inferior wall  H1N1 influenza with pneumonia  Acute blood loss anemia  ASSESSMENT / PLAN:  PULMONARY  ASSESSMENT: Acute respiratory failure Mostly in the setting of shock and hypoperfusion.  H1N1 and prob PNA LLL PLAN:   - Begin PS trials, diurese and SBT to extubate in AM. - Will need diureses prior to serious consideration for extubation. - Cont sedation protocol. - Continue steroids d/t bronchospasm solumedrol 40 q12. - Changed albuterol to xopenex d/t Vtach runs.  CARDIOVASCULAR  ASSESSMENT:  Acute anterior STEMI  S/p cardiac cath with occluded RCA. s/p PTCA/DES X1 on 12/20 Cardiogenic shock CHB on multiple pressors and IABP.  Runs of Vtach  PLAN:  - Per cards. - IABP out and off pressors, BP more stable. - Amiodarone per cardiology. - D/C levophed. - Start diuretics.  RENAL  ASSESSMENT:  Metabolic acidosis (positive anion gap c/w lactic acidosis) resolved.  PLAN:   Monitor bmet. Replace K. Lasix 20 q6 x3.  GASTROINTESTINAL  ASSESSMENT:   No acute issue PLAN:   Continue TFs. SUP. Gastric occult positive, BID protonix.  HEMATOLOGIC  ASSESSMENT:   History of stomach ulcers, Hg loss, likely from GI source. PLAN:  - Serial CBCs - Integrillen and effient per cards. - Abdominal CT. - Hemoccult GI content positive but with stable Hg, BID protonix.  INFECTIOUS  ASSESSMENT:    H1N1 POS.  - on tamiflu prob LLL PNA  PLAN:   Cont tamiflu BID. Cont unasyn.  ENDOCRINE  ASSESSMENT:   Hyperglycemia  PLAN:   cbgs q 4  NEUROLOGIC  ASSESSMENT:  pain   PLAN:   Cont sedation -minimise versed, ok to use fent gtt. Hypothermia protocol deferred given short downtime.  GLOBAL - updated husband at bedside   I have personally obtained a history, examined the patient, evaluated laboratory and imaging results, formulated the assessment  and plan and placed orders.  CRITICAL CARE: The patient is critically ill with multiple organ systems failure and requires high complexity decision making for assessment and support, frequent evaluation and titration of therapies, application of advanced monitoring technologies and extensive interpretation of multiple databases. Critical Care Time devoted to patient care services described in this note is 35  minutes.   Alyson Reedy, M.D. [facility] Pulmonary/Critical Care Medicine. Pager: [phone]. After hours pager: [phone].

## 2012-11-02 NOTE — Progress Notes (Signed)
Placed back on ventilator due to restlessness.

## 2012-11-03 ENCOUNTER — Inpatient Hospital Stay (HOSPITAL_COMMUNITY): Payer: Medicare Other

## 2012-11-03 DIAGNOSIS — J9819 Other pulmonary collapse: Secondary | ICD-10-CM | POA: Diagnosis not present

## 2012-11-03 DIAGNOSIS — Z4682 Encounter for fitting and adjustment of non-vascular catheter: Secondary | ICD-10-CM | POA: Diagnosis not present

## 2012-11-03 DIAGNOSIS — I2119 ST elevation (STEMI) myocardial infarction involving other coronary artery of inferior wall: Secondary | ICD-10-CM | POA: Diagnosis not present

## 2012-11-03 DIAGNOSIS — I469 Cardiac arrest, cause unspecified: Secondary | ICD-10-CM | POA: Diagnosis not present

## 2012-11-03 DIAGNOSIS — J1000 Influenza due to other identified influenza virus with unspecified type of pneumonia: Secondary | ICD-10-CM | POA: Diagnosis not present

## 2012-11-03 DIAGNOSIS — J9600 Acute respiratory failure, unspecified whether with hypoxia or hypercapnia: Secondary | ICD-10-CM | POA: Diagnosis not present

## 2012-11-03 LAB — BASIC METABOLIC PANEL
Calcium: 9.2 mg/dL (ref 8.4–10.5)
GFR calc Af Amer: >90 mL/min (ref >90)
GFR calc non Af Amer: 89 mL/min — ABNORMAL LOW (ref >90)
Glucose, Bld: 140 mg/dL — ABNORMAL HIGH (ref 70–99)
Potassium: 4 mEq/L (ref 3.5–5.1)
Sodium: 143 mEq/L (ref 135–145)

## 2012-11-03 LAB — CBC
HCT: 30 % — ABNORMAL LOW (ref 36.0–46.0)
MCH: 30.8 pg (ref 26.0–34.0)
MCV: 94.3 fL (ref 78.0–100.0)
RDW: 15.1 % (ref 11.5–15.5)
WBC: 17.8 10*3/uL — ABNORMAL HIGH (ref 4.0–10.5)

## 2012-11-03 LAB — CARBOXYHEMOGLOBIN: Total hemoglobin: 9.9 g/dL — ABNORMAL LOW (ref 12.0–16.0)

## 2012-11-03 LAB — BLOOD GAS, ARTERIAL
O2 Saturation: 93.7 %
PEEP: 5 cmH2O
Patient temperature: 98.6
RATE: 12 resp/min
pO2, Arterial: 64.6 mmHg — ABNORMAL LOW (ref 80.0–100.0)

## 2012-11-03 LAB — GLUCOSE, CAPILLARY
Glucose-Capillary: 122 mg/dL — ABNORMAL HIGH (ref 70–99)
Glucose-Capillary: 123 mg/dL — ABNORMAL HIGH (ref 70–99)

## 2012-11-03 IMAGING — CR DG CHEST 1V PORT
1 series · 1 of 1 positions shown · non-contrast
Comparison: Portable chest x-ray of [DATE]

CLINICAL DATA: Endotracheal tube position, follow-up

PORTABLE CHEST - 1 VIEW

[AP]
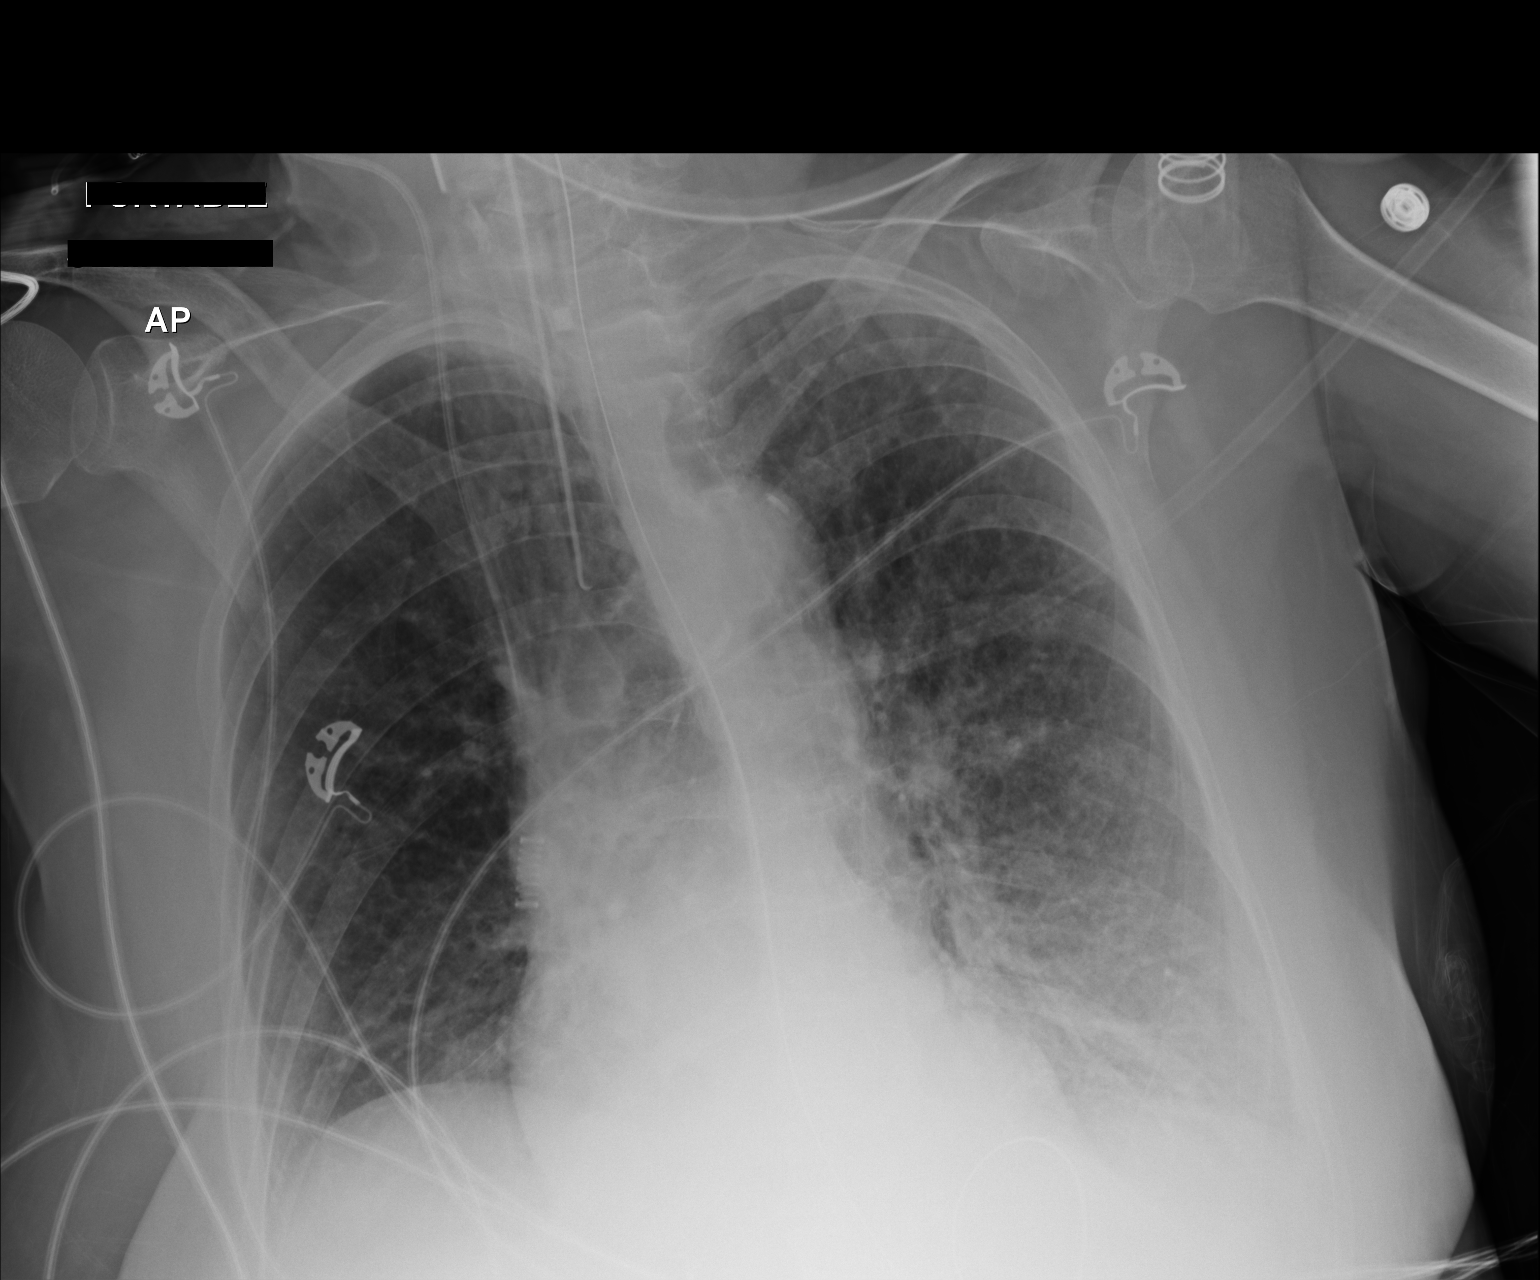

[1 of 1 positions shown; findings below may reference images not displayed]

FINDINGS: The tip of the endotracheal tube is approximally 4.2 cm
above the carina.  There does appear to be pulmonary vascular
congestion present with left basilar atelectasis.  Mild
cardiomegaly is stable.  The right central venous line is unchanged
in position and an NG tube is noted into the stomach.
IMPRESSION: 1.  Tip of endotracheal tube 4.2 cm above the carina.
2.  Some worsening of pulmonary vascular congestion and basilar
atelectasis.

## 2012-11-03 MED ORDER — FUROSEMIDE 10 MG/ML IJ SOLN
INTRAMUSCULAR | Status: AC
  Filled 2012-11-03: qty 4

## 2012-11-03 MED ORDER — INSULIN ASPART 100 UNIT/ML ~~LOC~~ SOLN
0.0000 [IU] | SUBCUTANEOUS | Status: DC
  Administered 2012-11-03 – 2012-11-05 (×6): 3 [IU] via SUBCUTANEOUS

## 2012-11-03 MED ORDER — PANTOPRAZOLE SODIUM 40 MG PO PACK
40.0000 mg | PACK | Freq: Two times a day (BID) | ORAL | Status: DC
  Administered 2012-11-03 – 2012-11-04 (×2): 40 mg
  Filled 2012-11-03 (×4): qty 20

## 2012-11-03 MED ORDER — METHYLPREDNISOLONE SODIUM SUCC 40 MG IJ SOLR
20.0000 mg | Freq: Two times a day (BID) | INTRAMUSCULAR | Status: DC
  Administered 2012-11-03 – 2012-11-05 (×4): 20 mg via INTRAVENOUS
  Filled 2012-11-03 (×5): qty 0.5

## 2012-11-03 NOTE — Progress Notes (Signed)
PULMONARY  / CRITICAL CARE MEDICINE  Name: Samantha King MRN: [id number] DOB: 1946/01/03    LOS: 5  REFERRING MD :  Clifton James  CHIEF COMPLAINT:  Respiratory failure   BRIEF PATIENT DESCRIPTION:  66 year old female smoker admitted on 12/20 after VF arrest in setting of  STEMI. Had < 1 minute of CPR. Was oriented on arrival to ER. Went to cath lab where was found to have occluded RCA. Developed shock and hypoperfusion during case. Intubated. DES placed in RCA. IABP placed, PCCM asked to see.   LINES / TUBES: oett 12/20>>> Right fem venous sheath 12/20>>> Right fem IABP 12/20>>>out Left art sheath 12/20>>>out  CULTURES: Sputum 12/20>>>oral flora Influenza PCR 12/20>>>Pos influenza A, H1N1 Urine strep 12/20>>>negative Urine legionella 12/20>>>negative  ANTIBIOTICS: tamiflu 12/20(H1N1 pos)>>> unasyn 12/21 (CAP)>>  SIGNIFICANT EVENTS:  Cardiac cath 12/20: Occluded proximal RCA now s/p PTCA/DES x 1   LEVEL OF CARE:  ICU  PRIMARY SERVICE:  PCCM CONSULTANTS:  PCCM  CODE STATUS: full  DIET: start TF 12/22 DVT Px:  integrilin  GI Px:  PPI    INTERVAL HISTORY:  Tolerating pressure support.  Agitated with WUA.   VITAL SIGNS: Temp:  [96.3 F (35.7 C)-98.8 F (37.1 C)] 98.4 F (36.9 C) (12/25 0800) Pulse Rate:  [59-86] 73  (12/25 0800) Resp:  [1-18] 13  (12/25 0800) BP: (84-161)/(22-88) 104/62 mmHg (12/25 0800) SpO2:  [93 %-100 %] 100 % (12/25 0818) Arterial Line BP: (91-153)/(77-89) 98/77 mmHg (12/24 1500) FiO2 (%):  [40 %] 40 % (12/25 0818) Weight:  [186 lb 1.1 oz (84.4 kg)] 186 lb 1.1 oz (84.4 kg) (12/25 0500) HEMODYNAMICS: CVP:  [6 mmHg-10 mmHg] 9 mmHg VENTILATOR SETTINGS: Vent Mode:  [-] CPAP;PSV FiO2 (%):  [40 %] 40 % Set Rate:  [12 bmp-20 bmp] 20 bmp Vt Set:  [650 mL] 650 mL Pressure Support:  [5 cmH20] 5 cmH20 Plateau Pressure:  [16 cmH20-17 cmH20] 16 cmH20  INTAKE / OUTPUT: Intake/Output      12/24 0701 - 12/25 0700 12/25 0701 - 12/26 0700   I.V.  (mL/kg) 1183.4 (14) 52.2 (0.6)   NG/GT 900 30   IV Piggyback 160    Total Intake(mL/kg) 2243.4 (26.6) 82.2 (1)   Urine (mL/kg/hr) 4050 (2) 50   Total Output 4050 50   Net -1806.6 +32.2         JY = BFO PHYSICAL EXAMINATION: General:  Agitated at times Neuro:  Moves all extremities, no following commands HEENT:  ETT in place Cardiovascular:  s1s2 regular, no murmur Lungs:  Scattered rhonchi, no wheeze Abdomen:  Soft, non tender Musculoskeletal:  No edema, foot drop boots in place Skin:  No rashes  LABS: Cbc  Lab 11/03/12 0345 11/02/12 0415 11/01/12 1200  WBC 17.8* -- --  HGB 9.8* 10.3* 10.4*  HCT 30.0* 30.7* 31.8*  PLT 265 245 227   Chemistry  Lab 11/03/12 0345 11/02/12 1615 11/02/12 0415 10/30/12 2155  NA 143 142 140 --  K 4.0 4.4 4.2 --  CL 106 108 109 --  CO2 30 27 24  --  BUN 30* 22 19 --  CREATININE 0.66 0.54 0.56 --  CALCIUM 9.2 9.2 9.1 --  MG 2.0 -- 2.2 1.5  PHOS 2.9 -- 2.4 --  GLUCOSE 140* 119* 168* --   Liver fxn  Lab 11/02/12 0415 10/30/12 0410 10/29/12 1215 10/29/12 1030  AST 38* 355* -- 33  ALT 33 88* -- 28  ALKPHOS 73 76 89 --  BILITOT 0.2* 0.2* --  0.1*  PROT 5.3* 5.3* -- 6.0  ALBUMIN 2.2* 2.8* -- 3.1*   coags  Lab 10/29/12 1030  APTT --  INR 1.03   Sepsis markers  Lab 10/31/12 0357 10/30/12 0500 10/30/12 0420 10/29/12 1300 10/29/12 1231  LATICACIDVEN -- -- 1.6 3.0* --  PROCALCITON 0.64 1.19 -- -- <0.10   Cardiac markers  Lab 10/31/12 0300 10/30/12 2115 10/30/12 1530 10/29/12 1030  CKTOTAL -- -- -- 81  CKMB -- -- -- 4.1*  TROPONINI >20.00* >20.00* >20.00* --   BNP  Lab 10/30/12 0410  PROBNP 1268.0*   ABG  Lab 11/03/12 0600 11/02/12 0432 10/31/12 0423  PHART 7.422 7.354 7.389  PCO2ART 44.9 43.6 34.8*  PO2ART 64.6* 73.0* 101.0*  HCO3 28.7* 23.7 20.4  TCO2 30.1 25.0 21.5   CBG trend  Lab 11/03/12 0747 11/03/12 0347 11/03/12 0002 11/02/12 1941 11/02/12 1622  GLUCAP 117* 131* 107* 123* 118*    Intake/Output Summary  (Last 24 hours) at 11/03/12 0932 Last data filed at 11/03/12 0800  Gross per 24 hour  Intake 2048.98 ml  Output   3975 ml  Net -1926.02 ml   IMAGING:  Ct Abdomen Pelvis Wo Contrast  11/01/2012  *RADIOLOGY REPORT*  Clinical Data: Decreased hemoglobin, post cardiac catheterization 10/30/12 with bilateral femoral catheter placement.  CT ABDOMEN AND PELVIS WITHOUT CONTRAST  Technique:  Multidetector CT imaging of the abdomen and pelvis was performed following the standard protocol without intravenous contrast.  Comparison: None.  Findings: Small bilateral pleural effusions with associated compressive atelectasis noted.  Nasogastric tube terminates within the stomach.  Presumed vicarious excretion of contrast into the gallbladder is noted.  There is low density, 5 HU, small amount of fluid in the gallbladder fossa and also small amount of low density pelvic free fluid.  There is a trace amount of fluid tracking superiorly from the right groin along the anterior aspect of the psoas muscle which measures fluid density. Small amount of bilateral flank subcutaneous soft tissue edema or stranding is noted.  Small fat containing left inguinal hernia.  No collection is seen in either inguinal region.  Unenhanced liver, right adrenal gland, kidneys, spleen, and pancreas are unremarkable.  There is a 1.1 cm mass like lesion in the left adrenal gland measuring 14 HU, compatible with an adenoma. No free air.  No bowel wall thickening or focal segmental dilatation.  Fat containing umbilical hernia noted. Bladder is decompressed with a Foley catheter in place.  Uterus and ovaries are normal.  No acute osseous finding. Disc degenerative changes are noted in the lumbar spine, including 4 mm anterolisthesis of L4 on L5.  IMPRESSION: Trace low density fluid extending from the right groin along the anterior margin of the psoas muscle.  Although it is difficult to obtain accurate Hounsfield unit measurements to determine the  composition of the fluid, areas measured are lower than expected for blood products, and if this is a retroperitoneal hematoma, it is extremely small.  Small amount of fluid around the gallbladder, of unclear etiology. This may be seen with systemic hypoproteinemia, cholecystitis, or occasionally liver disease.  No small amount of subcutaneous edema/stranding.  Small bilateral pleural effusions.  Constellation of findings suggests mild third spacing and possible volume overload.   Original Report Authenticated By: Christiana Pellant, M.D.    Dg Chest Port 1 View  11/03/2012  *RADIOLOGY REPORT*  Clinical Data: Endotracheal tube position, follow-up  PORTABLE CHEST - 1 VIEW  Comparison: Portable chest x-ray of 11/02/2012  Findings: The tip  of the endotracheal tube is approximally 4.2 cm above the carina.  There does appear to be pulmonary vascular congestion present with left basilar atelectasis.  Mild cardiomegaly is stable.  The right central venous line is unchanged in position and an NG tube is noted into the stomach.  IMPRESSION:  1.  Tip of endotracheal tube 4.2 cm above the carina. 2.  Some worsening of pulmonary vascular congestion and basilar atelectasis.   Original Report Authenticated By: Dwyane Dee, M.D.    Dg Chest Port 1 View  11/02/2012  *RADIOLOGY REPORT*  Clinical Data: Respiratory difficulty  PORTABLE CHEST - 1 VIEW  Comparison: Yesterday  Findings: Stable tubular devices.  Vascular congestion improved. Bibasilar airspace disease improved.  No pneumothorax.  IMPRESSION: Improved vascular congestion and bibasilar airspace disease.   Original Report Authenticated By: Jolaine Click, M.D.    Ct Portable Head W/o Cm  11/02/2012  *RADIOLOGY REPORT*  Clinical Data: Altered mental status.  CT HEAD WITHOUT CONTRAST  Technique:  Contiguous axial images were obtained from the base of the skull through the vertex without contrast.  Comparison: None.  Findings: No mass lesion, mass effect, midline shift,  hydrocephalus, hemorrhage.  No territorial ischemia or acute infarction.  Allowing for portable technique, gray white differentiation appears preserved.  Low attenuation adjacent to the frontal horns of the lateral ventricles is compatible with chronic ischemic disease. Fluid is present in the left mastoid air cells.  IMPRESSION: No acute intracranial abnormality.   Original Report Authenticated By: Andreas Newport, M.D.      DIAGNOSES: Active Problems:  Hypertension  GERD (gastroesophageal reflux disease)  Acute respiratory failure  Cardiogenic shock  Cardiac arrest  ST elevation myocardial infarction (STEMI) of inferior wall  H1N1 influenza with pneumonia  Acute blood loss anemia  ASSESSMENT / PLAN:  PULMONARY  ASSESSMENT: Acute respiratory failure Mostly in the setting of shock and hypoperfusion.  H1N1 and prob PNA LLL PLAN:   Pressure support wean as tolerated>>not ready for extubation yet F/u CXR Change solumedrol to 20 mg q12h Continue BD's with xopenex due to tachyarrhythmias  CARDIOVASCULAR  ASSESSMENT:  Acute anterior STEMI  S/p cardiac cath with occluded RCA. s/p PTCA/DES X1 on 12/20 Cardiogenic shock Resolved. CHB Runs of Vtach  PLAN:  Keep even to negative fluid balance >> goal CVP < 10 Amiodarone per cardiology Continue lipitor, asa, effient Defer timing of adding beta blocker/ACE to cardiology  RENAL  ASSESSMENT:  Metabolic acidosis (positive anion gap c/w lactic acidosis). Resolved.  PLAN:   Monitor urine outpt, renal fx, electrolytes  GASTROINTESTINAL  ASSESSMENT:   Nutrition. Stool occult blood positive with hx of PUD. PLAN:   Continue TFs while on vent Continue BID protonix.  HEMATOLOGIC  ASSESSMENT:   Anemia. PLAN:  F/u CBC Transfuse for Hb < 8 in setting of ACS  INFECTIOUS  ASSESSMENT:   H1N1 positive with LLL PNA.  PLAN:   Continue tamiflu and unasyn.  ENDOCRINE  ASSESSMENT:   Hyperglycemia  PLAN:    SSI  NEUROLOGIC  ASSESSMENT:  Acute encephalopathy 2nd to cardiac arrest, shock, respiratory failure. CT head 12/24 re-assuring.  PLAN:   Limit sedation while on vent  GLOBAL - updated husband at bedside   Critical care time 35 minutes.  Coralyn Helling, MD [facility] Pulmonary/Critical Care 11/03/2012, 9:32 AM Pager:  [phone] After 3pm call: [phone]

## 2012-11-03 NOTE — Progress Notes (Signed)
Primary cardiologist: Dr. Verne Carrow  Subjective:   Sedated on ventilator, has not yet weaned. Also had increased agitation without sedation.   Objective:   Temp:  [96.3 F (35.7 C)-98.8 F (37.1 C)] 98.4 F (36.9 C) (12/25 0600) Pulse Rate:  [59-86] 75  (12/25 0600) Resp:  [1-23] 18  (12/25 0600) BP: (84-161)/(22-88) 112/72 mmHg (12/25 0600) SpO2:  [93 %-100 %] 94 % (12/25 0600) Arterial Line BP: (91-153)/(72-89) 98/77 mmHg (12/24 1500) FiO2 (%):  [40 %] 40 % (12/25 0600) Weight:  [186 lb 1.1 oz (84.4 kg)] 186 lb 1.1 oz (84.4 kg) (12/25 0500)    Filed Weights   11/01/12 0449 11/02/12 0500 11/03/12 0500  Weight: 186 lb 1.1 oz (84.4 kg) 194 lb 7.1 oz (88.2 kg) 186 lb 1.1 oz (84.4 kg)    Intake/Output Summary (Last 24 hours) at 11/03/12 0617 Last data filed at 11/03/12 0600  Gross per 24 hour  Intake 2161.18 ml  Output   4050 ml  Net -1888.82 ml   Co-oximetry 71, CVP 8-10  Telemetry: SInus rhythm, no significant VT.  Exam:  General: Sedated on ventilator.  Lungs: Coarse BS.  Cardiac: RRR, no gallop.  Abdomen: NABS.  Extremities: No pitting.  Lab Results:  Basic Metabolic Panel:  Lab 11/03/12 8657 11/02/12 1615 11/02/12 0415 10/30/12 2155  NA 143 142 140 --  K 4.0 4.4 4.2 --  CL 106 108 109 --  CO2 30 27 24  --  GLUCOSE 140* 119* 168* --  BUN 30* 22 19 --  CREATININE 0.66 0.54 0.56 --  CALCIUM 9.2 9.2 9.1 --  MG 2.0 -- 2.2 1.5    Liver Function Tests:  Lab 11/02/12 0415 10/30/12 0410 10/29/12 1215 10/29/12 1030  AST 38* 355* -- 33  ALT 33 88* -- 28  ALKPHOS 73 76 89 --  BILITOT 0.2* 0.2* -- 0.1*  PROT 5.3* 5.3* -- 6.0  ALBUMIN 2.2* 2.8* -- 3.1*    CBC:  Lab 11/03/12 0345 11/02/12 0415 11/01/12 1200  WBC 17.8* 20.6* 20.6*  HGB 9.8* 10.3* 10.4*  HCT 30.0* 30.7* 31.8*  MCV 94.3 94.2 94.6  PLT 265 245 227    Cardiac Enzymes:  Lab 10/31/12 0300 10/30/12 2115 10/30/12 1530 10/29/12 1030  CKTOTAL -- -- -- 81  CKMB -- -- --  4.1*  CKMBINDEX -- -- -- --  TROPONINI >20.00* >20.00* >20.00* --    Medications:   Scheduled Medications:    . ampicillin-sulbactam (UNASYN) IV  1.5 g Intravenous Q8H  . antiseptic oral rinse  15 mL Mouth Rinse QID  . aspirin  81 mg Per NG tube Daily  . atorvastatin  80 mg Per NG tube q1800  . chlorhexidine  15 mL Mouth Rinse BID  . enoxaparin (LOVENOX) injection  40 mg Subcutaneous Q24H  . feeding supplement (JEVITY 1.2 CAL)  1,000 mL Per Tube Q24H  . feeding supplement  60 mL Per Tube BID  . insulin aspart  2-6 Units Subcutaneous Q4H  . insulin glargine  10 Units Subcutaneous Q24H  . levalbuterol  0.63 mg Nebulization Q6H  . methylPREDNISolone (SOLU-MEDROL) injection  40 mg Intravenous Q12H  . multivitamin  5 mL Per Tube Daily  . oseltamivir  75 mg Oral BID  . pantoprazole (PROTONIX) IV  40 mg Intravenous Q12H  . prasugrel  10 mg Per NG tube Daily  . sodium chloride  3 mL Intravenous Q12H    Infusions:    . sodium chloride 20 mL/hr at 11/01/12  0001  . amiodarone (NEXTERONE PREMIX) 360 mg/200 mL dextrose 30 mg/hr (11/03/12 0115)  . dextrose    . DOPamine 3 mcg/kg/min (11/01/12 1300)  . fentaNYL infusion INTRAVENOUS 125 mcg/hr (11/03/12 0600)  . midazolam (VERSED) infusion 3 mg/hr (11/03/12 0200)  . norepinephrine (LEVOPHED) Adult infusion Stopped (11/02/12 0539)    PRN Medications: sodium chloride, acetaminophen (TYLENOL) oral liquid 160 mg/5 mL, acetaminophen, ALPRAZolam, dextrose, fentaNYL, fentaNYL, midazolam, midazolam, nitroGLYCERIN, ondansetron (ZOFRAN) IV, sodium chloride, sodium chloride, zolpidem   Assessment:   1. Inferior STEMI complicated by VF arrest on 12/20. Has had ventilator dependent respiratory failure, also required IABP and pressors for cardiogenic shock. Critical care medicine following.  2. CAD status post DES to RCA, otherwise residual mild atherosclerosis in LAD and anomalous circumflex. LVEF 50% with inferoposterior hypokinesis, reasonable  RV function.  3. Influenza.  4. NSVT.  5. COPD.  6. Blood loss anemia.  Plan/Discussion:    No further diuresis today with 1800cc out and CVP down to 8-10. Continue ventilator support per CCM. She had head CT yesterday without acute findings. On ASA, Effient, Lipitor. Not able to consider beta blocker or ACE-I at this time.   Jonelle Sidle, M.D., F.A.C.C.

## 2012-11-04 ENCOUNTER — Inpatient Hospital Stay (HOSPITAL_COMMUNITY): Payer: Medicare Other

## 2012-11-04 DIAGNOSIS — R0989 Other specified symptoms and signs involving the circulatory and respiratory systems: Secondary | ICD-10-CM | POA: Diagnosis not present

## 2012-11-04 DIAGNOSIS — J9600 Acute respiratory failure, unspecified whether with hypoxia or hypercapnia: Secondary | ICD-10-CM | POA: Diagnosis not present

## 2012-11-04 DIAGNOSIS — R570 Cardiogenic shock: Secondary | ICD-10-CM | POA: Diagnosis not present

## 2012-11-04 DIAGNOSIS — I2119 ST elevation (STEMI) myocardial infarction involving other coronary artery of inferior wall: Secondary | ICD-10-CM | POA: Diagnosis not present

## 2012-11-04 DIAGNOSIS — I469 Cardiac arrest, cause unspecified: Secondary | ICD-10-CM | POA: Diagnosis not present

## 2012-11-04 DIAGNOSIS — Z452 Encounter for adjustment and management of vascular access device: Secondary | ICD-10-CM | POA: Diagnosis not present

## 2012-11-04 LAB — CBC
MCH: 30.7 pg (ref 26.0–34.0)
Platelets: 301 10*3/uL (ref 150–400)
RBC: 3.19 MIL/uL — ABNORMAL LOW (ref 3.87–5.11)
RDW: 15.1 % (ref 11.5–15.5)
WBC: 14.1 10*3/uL — ABNORMAL HIGH (ref 4.0–10.5)

## 2012-11-04 LAB — BASIC METABOLIC PANEL
Chloride: 106 mEq/L (ref 96–112)
GFR calc Af Amer: >90 mL/min (ref >90)
Potassium: 4 mEq/L (ref 3.5–5.1)

## 2012-11-04 LAB — GLUCOSE, CAPILLARY
Glucose-Capillary: 109 mg/dL — ABNORMAL HIGH (ref 70–99)
Glucose-Capillary: 123 mg/dL — ABNORMAL HIGH (ref 70–99)

## 2012-11-04 IMAGING — CR DG CHEST 1V PORT
1 series · 1 of 1 positions shown · non-contrast
Comparison: One-view chest [DATE].

CLINICAL DATA: Pulmonary infiltrates.  Check endotracheal tube
position.

PORTABLE CHEST - 1 VIEW

[AP]
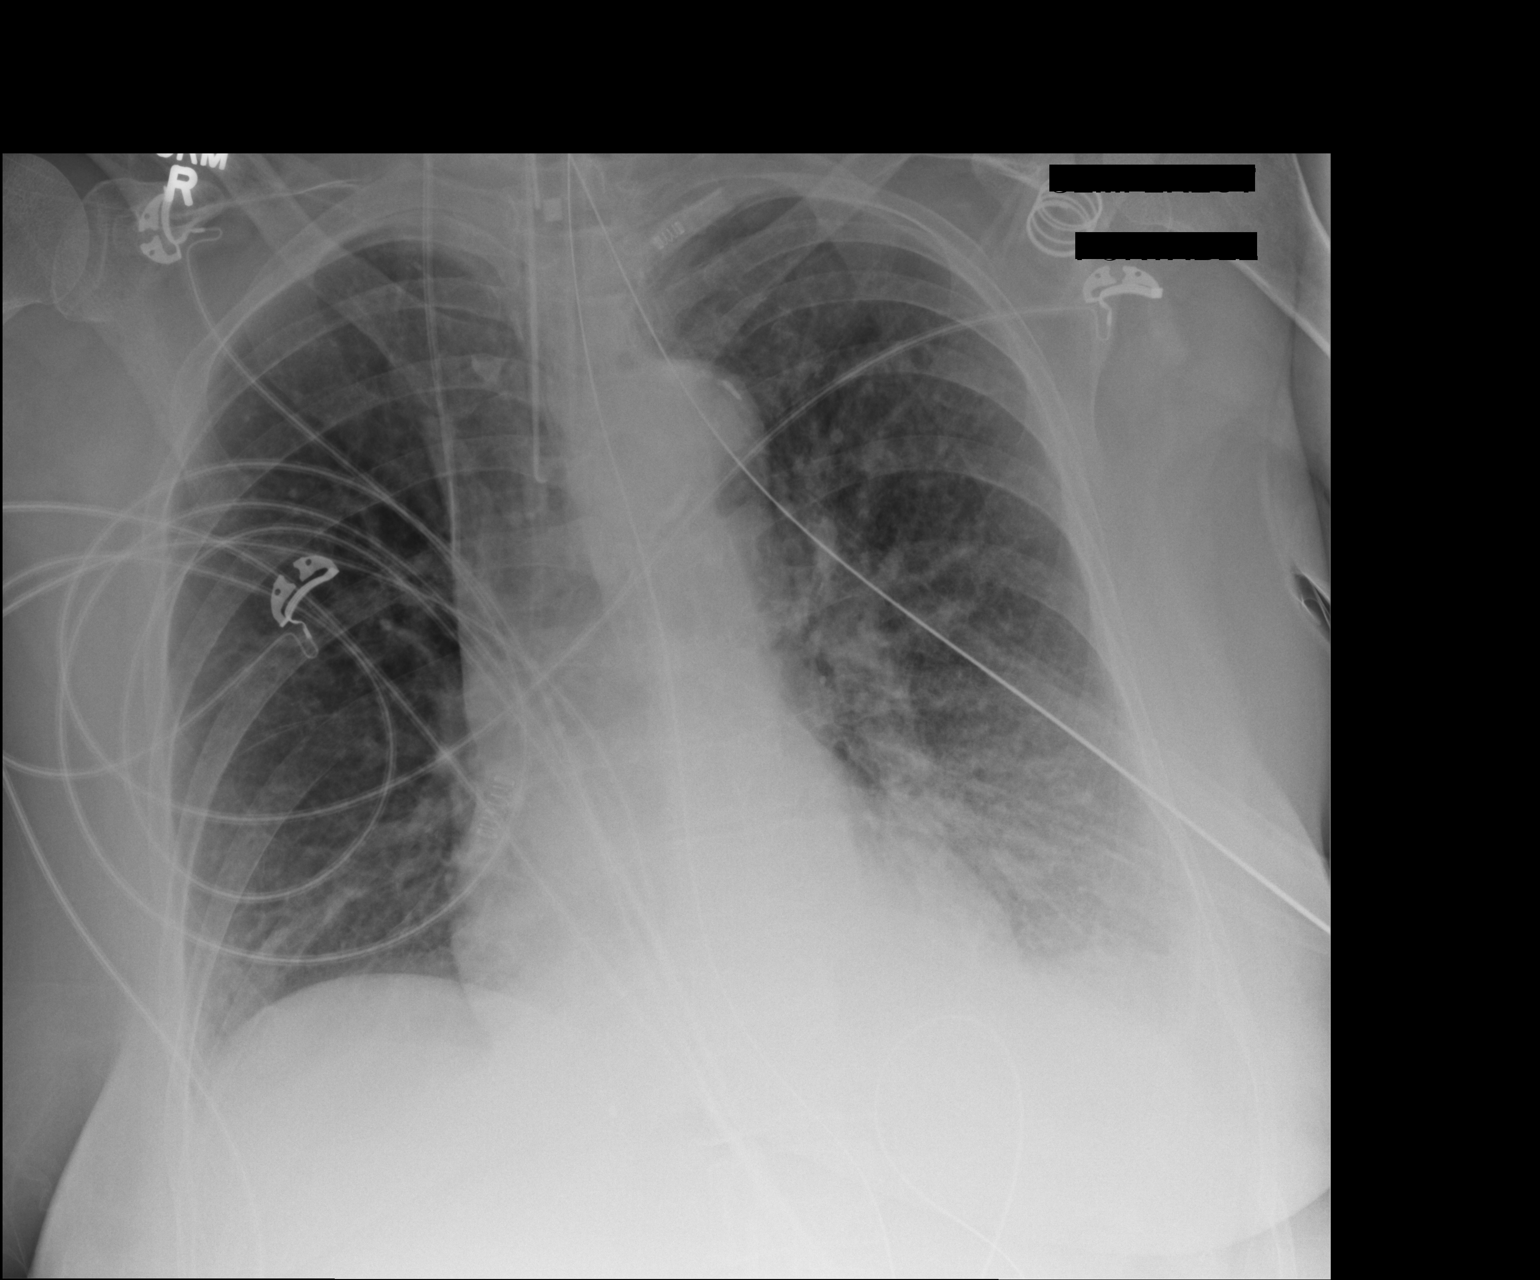

[1 of 1 positions shown; findings below may reference images not displayed]

FINDINGS: The endotracheal tube terminates 3.5 cm above the carina,
in satisfactory position.  A right IJ line stable.  The NG tube
courses off the inferior border of the film.  This left basilar
airspace disease is stable.  Mild pulmonary congestion is
unchanged.  The visualized soft tissues and bony thorax are
unremarkable.
IMPRESSION: 1.  Persistent asymmetric left basilar airspace disease.  This is
concerning for pneumonia.
2.  Stable mild pulmonary vascular congestion.
3.  Satisfactory positioning of the endotracheal tube.

## 2012-11-04 MED ORDER — PANTOPRAZOLE SODIUM 40 MG IV SOLR
40.0000 mg | Freq: Every day | INTRAVENOUS | Status: DC
  Administered 2012-11-05 (×2): 40 mg via INTRAVENOUS
  Filled 2012-11-04 (×4): qty 40

## 2012-11-04 MED ORDER — HALOPERIDOL LACTATE 5 MG/ML IJ SOLN
5.0000 mg | Freq: Once | INTRAMUSCULAR | Status: AC
  Administered 2012-11-05: 5 mg via INTRAVENOUS
  Filled 2012-11-04: qty 1

## 2012-11-04 MED ORDER — FUROSEMIDE 10 MG/ML IJ SOLN
40.0000 mg | Freq: Once | INTRAMUSCULAR | Status: AC
  Administered 2012-11-04: 40 mg via INTRAVENOUS
  Filled 2012-11-04: qty 4

## 2012-11-04 NOTE — Progress Notes (Signed)
eLink Physician-Brief Progress Note Patient Name: Keyri Salberg DOB: [birth year] MRN: [id number]  Date of Service  11/04/2012   HPI/Events of Note  Patient extubated today but having difficulty taking po meds.  Currently on protonix and tamiflu   eICU Interventions  Plan: Protonix changed to IV Cannot give tamiflu IV - instructed nurse to discuss with pharmacy.   Intervention Category Minor Interventions: Routine modifications to care plan (e.g. PRN medications for pain, fever)  DETERDING,ELIZABETH 11/04/2012, 11:49 PM

## 2012-11-04 NOTE — Progress Notes (Addendum)
PULMONARY  / CRITICAL CARE MEDICINE  Name: Samantha King MRN: [id number] DOB: Jun 24, 1946    LOS: 6  REFERRING MD :  Clifton James  CHIEF COMPLAINT:  Respiratory failure   BRIEF PATIENT DESCRIPTION:  66 year old female smoker admitted on 12/20 after VF arrest in setting of  STEMI. Had < 1 minute of CPR. Was oriented on arrival to ER. Went to cath lab where was found to have occluded RCA. Developed shock and hypoperfusion during case. Intubated. DES placed in RCA. IABP placed, PCCM asked to see.   LINES / TUBES: oett 12/20>>> Right fem venous sheath 12/20>>> Right fem IABP 12/20>>>out Left art sheath 12/20>>>out  CULTURES: Sputum 12/20>>>oral flora Influenza PCR 12/20>>>Pos influenza A, H1N1 Urine strep 12/20>>>negative Urine legionella 12/20>>>negative  ANTIBIOTICS: tamiflu 12/20(H1N1 pos)>>> unasyn 12/21 (CAP)>>  SIGNIFICANT EVENTS:  Cardiac cath 12/20: Occluded proximal RCA now s/p PTCA/DES x 1   LEVEL OF CARE:  ICU  PRIMARY SERVICE:  PCCM CONSULTANTS:  PCCM  CODE STATUS: full  DIET: start TF 12/22 DVT Px:  integrilin  GI Px:  PPI    INTERVAL HISTORY:  Tolerating pressure support.  Agitated with WUA.   VITAL SIGNS: Temp:  [98.2 F (36.8 C)-98.8 F (37.1 C)] 98.8 F (37.1 C) (12/26 0900) Pulse Rate:  [58-92] 76  (12/26 0900) Resp:  [7-20] 13  (12/26 0900) BP: (73-158)/(43-116) 118/72 mmHg (12/26 0900) SpO2:  [95 %-100 %] 96 % (12/26 0900) FiO2 (%):  [40 %] 40 % (12/26 0808) Weight:  [85.9 kg (189 lb 6 oz)] 85.9 kg (189 lb 6 oz) (12/26 0431) HEMODYNAMICS: CVP:  [11 mmHg-19 mmHg] 19 mmHg VENTILATOR SETTINGS: Vent Mode:  [-] CPAP FiO2 (%):  [40 %] 40 % Set Rate:  [12 bmp] 12 bmp Vt Set:  [650 mL] 650 mL PEEP:  [5 cmH20] 5 cmH20 Pressure Support:  [5 cmH20-8 cmH20] 8 cmH20 Plateau Pressure:  [17 cmH20-20 cmH20] 20 cmH20  INTAKE / OUTPUT: Intake/Output      12/25 0701 - 12/26 0700 12/26 0701 - 12/27 0700   I.V. (mL/kg) 1107.9 (12.9) 91.4 (1.1)   NG/GT  750 105   IV Piggyback 150    Total Intake(mL/kg) 2007.9 (23.4) 196.4 (2.3)   Urine (mL/kg/hr) 1225 (0.6) 200   Total Output 1225 200   Net +782.9 -3.6         PHYSICAL EXAMINATION: General:  Agitated at times Neuro:  Moves all extremities, no following commands HEENT:  ETT in place Cardiovascular:  s1s2 regular, no murmur Lungs:  Scattered rhonchi, no wheeze Abdomen:  Soft, non tender Musculoskeletal:  No edema, foot drop boots in place Skin:  No rashes  LABS: Cbc  Lab 11/04/12 0418 11/03/12 0345 11/02/12 0415  WBC 14.1* -- --  HGB 9.8* 9.8* 10.3*  HCT 30.0* 30.0* 30.7*  PLT 301 265 245   Chemistry  Lab 11/04/12 0418 11/03/12 0345 11/02/12 1615 11/02/12 0415 10/30/12 2155  NA 143 143 142 -- --  K 4.0 4.0 4.4 -- --  CL 106 106 108 -- --  CO2 32 30 27 -- --  BUN 32* 30* 22 -- --  CREATININE 0.59 0.66 0.54 -- --  CALCIUM 9.0 9.2 9.2 -- --  MG -- 2.0 -- 2.2 1.5  PHOS -- 2.9 -- 2.4 --  GLUCOSE 120* 140* 119* -- --   Liver fxn  Lab 11/02/12 0415 10/30/12 0410 10/29/12 1215 10/29/12 1030  AST 38* 355* -- 33  ALT 33 88* -- 28  ALKPHOS 73  76 89 --  BILITOT 0.2* 0.2* -- 0.1*  PROT 5.3* 5.3* -- 6.0  ALBUMIN 2.2* 2.8* -- 3.1*   coags  Lab 10/29/12 1030  APTT --  INR 1.03   Sepsis markers  Lab 10/31/12 0357 10/30/12 0500 10/30/12 0420 10/29/12 1300 10/29/12 1231  LATICACIDVEN -- -- 1.6 3.0* --  PROCALCITON 0.64 1.19 -- -- <0.10   Cardiac markers  Lab 10/31/12 0300 10/30/12 2115 10/30/12 1530 10/29/12 1030  CKTOTAL -- -- -- 81  CKMB -- -- -- 4.1*  TROPONINI >20.00* >20.00* >20.00* --   BNP  Lab 10/30/12 0410  PROBNP 1268.0*   ABG  Lab 11/03/12 0600 11/02/12 0432 10/31/12 0423  PHART 7.422 7.354 7.389  PCO2ART 44.9 43.6 34.8*  PO2ART 64.6* 73.0* 101.0*  HCO3 28.7* 23.7 20.4  TCO2 30.1 25.0 21.5   CBG trend  Lab 11/04/12 0748 11/04/12 0422 11/04/12 0013 11/03/12 2034 11/03/12 1533  GLUCAP 123* 120* 124* 109* 123*    Intake/Output Summary  (Last 24 hours) at 11/04/12 0957 Last data filed at 11/04/12 4098  Gross per 24 hour  Intake 2044.98 ml  Output   1375 ml  Net 669.98 ml   IMAGING:  Dg Chest Port 1 View  11/04/2012  *RADIOLOGY REPORT*  Clinical Data: Pulmonary infiltrates.  Check endotracheal tube position.  PORTABLE CHEST - 1 VIEW  Comparison: One-view chest 11/03/2012.  Findings: The endotracheal tube terminates 3.5 cm above the carina, in satisfactory position.  A right IJ line stable.  The NG tube courses off the inferior border of the film.  This left basilar airspace disease is stable.  Mild pulmonary congestion is unchanged.  The visualized soft tissues and bony thorax are unremarkable.  IMPRESSION:  1.  Persistent asymmetric left basilar airspace disease.  This is concerning for pneumonia. 2.  Stable mild pulmonary vascular congestion. 3.  Satisfactory positioning of the endotracheal tube.   Original Report Authenticated By: Marin Roberts, M.D.    Dg Chest Port 1 View  11/03/2012  *RADIOLOGY REPORT*  Clinical Data: Endotracheal tube position, follow-up  PORTABLE CHEST - 1 VIEW  Comparison: Portable chest x-ray of 11/02/2012  Findings: The tip of the endotracheal tube is approximally 4.2 cm above the carina.  There does appear to be pulmonary vascular congestion present with left basilar atelectasis.  Mild cardiomegaly is stable.  The right central venous line is unchanged in position and an NG tube is noted into the stomach.  IMPRESSION:  1.  Tip of endotracheal tube 4.2 cm above the carina. 2.  Some worsening of pulmonary vascular congestion and basilar atelectasis.   Original Report Authenticated By: Dwyane Dee, M.D.    Ct Portable Head W/o Cm  11/02/2012  *RADIOLOGY REPORT*  Clinical Data: Altered mental status.  CT HEAD WITHOUT CONTRAST  Technique:  Contiguous axial images were obtained from the base of the skull through the vertex without contrast.  Comparison: None.  Findings: No mass lesion, mass effect,  midline shift, hydrocephalus, hemorrhage.  No territorial ischemia or acute infarction.  Allowing for portable technique, gray white differentiation appears preserved.  Low attenuation adjacent to the frontal horns of the lateral ventricles is compatible with chronic ischemic disease. Fluid is present in the left mastoid air cells.  IMPRESSION: No acute intracranial abnormality.   Original Report Authenticated By: Andreas Newport, M.D.      DIAGNOSES: Active Problems:  Hypertension  GERD (gastroesophageal reflux disease)  Acute respiratory failure  Cardiogenic shock  Cardiac arrest  ST elevation myocardial  infarction (STEMI) of inferior wall  H1N1 influenza with pneumonia  Acute blood loss anemia  ASSESSMENT / PLAN:  PULMONARY  ASSESSMENT: Acute respiratory failure Mostly in the setting of shock and hypoperfusion.  H1N1 and prob PNA LLL PLAN:   Weaning well, agitation is the primary issues here, will stop sedation and extubate, only potential for failure would be due to mental state. F/u CXR. Continue solumedrol to 20 mg q12h. Continue BD's with xopenex due to tachyarrhythmias.  CARDIOVASCULAR  ASSESSMENT:  Acute anterior STEMI  S/p cardiac cath with occluded RCA. s/p PTCA/DES X1 on 12/20 Cardiogenic shock Resolved. CHB Runs of Vtach  PLAN:  Additional lasix today, CVP of 19. Amiodarone per cardiology. Continue lipitor, asa, effient. Defer timing of adding beta blocker/ACE to cardiology.  RENAL  ASSESSMENT:  Metabolic acidosis (positive anion gap c/w lactic acidosis). Resolved.  PLAN:   Monitor urine outpt, renal fx, electrolytes.  GASTROINTESTINAL  ASSESSMENT:   Nutrition. Stool occult blood positive with hx of PUD. PLAN:   Continue TFs while on vent. Continue BID protonix.  HEMATOLOGIC  ASSESSMENT:   Anemia. PLAN:  F/u CBC. Transfuse for Hb < 8 in setting of ACS.  INFECTIOUS  ASSESSMENT:   H1N1 positive with LLL PNA.  PLAN:   Continue  tamiflu and unasyn.  ENDOCRINE  ASSESSMENT:   Hyperglycemia  PLAN:   SSI  NEUROLOGIC  ASSESSMENT:  Acute encephalopathy 2nd to cardiac arrest, shock, respiratory failure. CT head 12/24 re-assuring.  PLAN:   D/C sedation for extubation today.  GLOBAL - updated husband at bedside   Critical care time 35 minutes.  Alyson Reedy, M.D. [facility] Pulmonary/Critical Care Medicine. Pager: [phone]. After hours pager: [phone].

## 2012-11-04 NOTE — Progress Notes (Signed)
eLink Physician-Brief Progress Note Patient Name: Samantha King DOB: [birth year] MRN: [id number]  Date of Service  11/04/2012   HPI/Events of Note  Call from nurse reporting that patient is confused stating that she feels like something is crawling on her, that she is going to drown if she drinks water and she feels "dead".  Nurse requesting medication for anxiety.   eICU Interventions  Plan: Will try haldol 5 mg IV times one for agitation/delirium.  QTc on EKG is less than 500    Intervention Category Major Interventions: Delirium, psychosis, severe agitation - evaluation and management  DETERDING,ELIZABETH 11/04/2012, 11:57 PM

## 2012-11-04 NOTE — Progress Notes (Signed)
Primary cardiologist: Dr. Verne Carrow  Subjective:    Remains on ventilator. Did well with weaning yesterday but gets agitated when sedation wears off.  CVP checked personally = 15  Objective:   Temp:  [98.2 F (36.8 C)-98.8 F (37.1 C)] 98.8 F (37.1 C) (12/26 0900) Pulse Rate:  [58-92] 76  (12/26 0900) Resp:  [7-20] 13  (12/26 0900) BP: (73-158)/(43-116) 118/72 mmHg (12/26 0900) SpO2:  [95 %-100 %] 96 % (12/26 0900) FiO2 (%):  [40 %] 40 % (12/26 0808) Weight:  [85.9 kg (189 lb 6 oz)] 85.9 kg (189 lb 6 oz) (12/26 0431)    Filed Weights   11/02/12 0500 11/03/12 0500 11/04/12 0431  Weight: 88.2 kg (194 lb 7.1 oz) 84.4 kg (186 lb 1.1 oz) 85.9 kg (189 lb 6 oz)    Intake/Output Summary (Last 24 hours) at 11/04/12 0919 Last data filed at 11/04/12 0800  Gross per 24 hour  Intake 1927.28 ml  Output   1350 ml  Net 577.28 ml   Co-oximetry 71, CVP 15  Telemetry: SInus rhythm, no significant VT.  Exam:  General: Sedated on ventilator. Intermittently agitated  Lungs: Coarse BS.  Cardiac: RRR, no gallop.  Abdomen: NABS. Soft NT  Extremities: warm. 2+ edema  Lab Results:  Basic Metabolic Panel:  Lab 11/04/12 1610 11/03/12 0345 11/02/12 1615 11/02/12 0415 10/30/12 2155  NA 143 143 142 -- --  K 4.0 4.0 4.4 -- --  CL 106 106 108 -- --  CO2 32 30 27 -- --  GLUCOSE 120* 140* 119* -- --  BUN 32* 30* 22 -- --  CREATININE 0.59 0.66 0.54 -- --  CALCIUM 9.0 9.2 9.2 -- --  MG -- 2.0 -- 2.2 1.5    Liver Function Tests:  Lab 11/02/12 0415 10/30/12 0410 10/29/12 1215 10/29/12 1030  AST 38* 355* -- 33  ALT 33 88* -- 28  ALKPHOS 73 76 89 --  BILITOT 0.2* 0.2* -- 0.1*  PROT 5.3* 5.3* -- 6.0  ALBUMIN 2.2* 2.8* -- 3.1*    CBC:  Lab 11/04/12 0418 11/03/12 0345 11/02/12 0415  WBC 14.1* 17.8* 20.6*  HGB 9.8* 9.8* 10.3*  HCT 30.0* 30.0* 30.7*  MCV 94.0 94.3 94.2  PLT 301 265 245    Cardiac Enzymes:  Lab 10/31/12 0300 10/30/12 2115 10/30/12 1530 10/29/12  1030  CKTOTAL -- -- -- 81  CKMB -- -- -- 4.1*  CKMBINDEX -- -- -- --  TROPONINI >20.00* >20.00* >20.00* --    Medications:   Scheduled Medications:    . ampicillin-sulbactam (UNASYN) IV  1.5 g Intravenous Q8H  . antiseptic oral rinse  15 mL Mouth Rinse QID  . aspirin  81 mg Per NG tube Daily  . atorvastatin  80 mg Per NG tube q1800  . chlorhexidine  15 mL Mouth Rinse BID  . enoxaparin (LOVENOX) injection  40 mg Subcutaneous Q24H  . feeding supplement (JEVITY 1.2 CAL)  1,000 mL Per Tube Q24H  . feeding supplement  60 mL Per Tube BID  . furosemide  40 mg Intravenous Once  . insulin aspart  0-20 Units Subcutaneous Q4H  . levalbuterol  0.63 mg Nebulization Q6H  . methylPREDNISolone (SOLU-MEDROL) injection  20 mg Intravenous Q12H  . multivitamin  5 mL Per Tube Daily  . oseltamivir  75 mg Oral BID  . pantoprazole sodium  40 mg Per Tube BID  . prasugrel  10 mg Per NG tube Daily  . sodium chloride  3 mL Intravenous  Q12H    Infusions:    . sodium chloride 20 mL/hr at 11/03/12 1742  . amiodarone (NEXTERONE PREMIX) 360 mg/200 mL dextrose 30 mg/hr (11/04/12 0023)  . fentaNYL infusion INTRAVENOUS 150 mcg/hr (11/04/12 0810)  . midazolam (VERSED) infusion 4 mg/hr (11/04/12 0837)    PRN Medications: sodium chloride, acetaminophen, fentaNYL, fentaNYL, midazolam, midazolam, nitroGLYCERIN, ondansetron (ZOFRAN) IV, sodium chloride   Assessment:   1. Inferior STEMI complicated by VF arrest on 12/20. Has had ventilator dependent respiratory failure, also required IABP and pressors for cardiogenic shock. Critical care medicine following.  2. CAD status post DES to RCA, otherwise residual mild atherosclerosis in LAD and anomalous circumflex. LVEF 50% with inferoposterior hypokinesis, reasonable RV function.  3. Influenza.  4. NSVT.  5. COPD.  6. Blood loss anemia.  Plan/Discussion:    She seems like she is close to extubation. Will give 40mg  IV lasix no and hold TFs. I will d/w Dr.  Molli Knock about possibility of extubation as her hemodynamic situation is much improved.  On ASA, Effient, Lipitor. Consider low-dose b-blocker once resp status more stable.  Family updated at bedside.   Lenice Koper,MD 9:22 AM

## 2012-11-04 NOTE — Progress Notes (Addendum)
Wasted of IV Fentanyl and 40ml of Versed with 2 RN's per protocol. Herminio Heads, RN witnessed by Heloise Beecham, RN.  co signed C Mahmud Keithly RN

## 2012-11-04 NOTE — Progress Notes (Signed)
11/04/2012- Pt extubated at 1130 to 6lpm cannula- weaning oxygen as tolerated.  Pt non-verbal at time of extubation and unable to evaluate speech.  Will monitor progress.  Vent pulled from room at approx 160- pt stable and does not appear to need further vent support at this time.

## 2012-11-05 ENCOUNTER — Inpatient Hospital Stay (HOSPITAL_COMMUNITY): Payer: Medicare Other

## 2012-11-05 DIAGNOSIS — I469 Cardiac arrest, cause unspecified: Secondary | ICD-10-CM | POA: Diagnosis not present

## 2012-11-05 DIAGNOSIS — E876 Hypokalemia: Secondary | ICD-10-CM | POA: Diagnosis not present

## 2012-11-05 DIAGNOSIS — R570 Cardiogenic shock: Secondary | ICD-10-CM | POA: Diagnosis not present

## 2012-11-05 DIAGNOSIS — J9600 Acute respiratory failure, unspecified whether with hypoxia or hypercapnia: Secondary | ICD-10-CM | POA: Diagnosis not present

## 2012-11-05 DIAGNOSIS — I2119 ST elevation (STEMI) myocardial infarction involving other coronary artery of inferior wall: Secondary | ICD-10-CM | POA: Diagnosis not present

## 2012-11-05 DIAGNOSIS — D62 Acute posthemorrhagic anemia: Secondary | ICD-10-CM | POA: Diagnosis not present

## 2012-11-05 LAB — GLUCOSE, CAPILLARY
Glucose-Capillary: 105 mg/dL — ABNORMAL HIGH (ref 70–99)
Glucose-Capillary: 123 mg/dL — ABNORMAL HIGH (ref 70–99)

## 2012-11-05 LAB — BASIC METABOLIC PANEL
BUN: 21 mg/dL (ref 6–23)
CO2: 31 mEq/L (ref 19–32)
Calcium: 9.4 mg/dL (ref 8.4–10.5)
Chloride: 96 mEq/L (ref 96–112)
Creatinine, Ser: 0.57 mg/dL (ref 0.50–1.10)
Glucose, Bld: 103 mg/dL — ABNORMAL HIGH (ref 70–99)

## 2012-11-05 LAB — MAGNESIUM: Magnesium: 2.3 mg/dL (ref 1.5–2.5)

## 2012-11-05 LAB — CBC
HCT: 34.6 % — ABNORMAL LOW (ref 36.0–46.0)
MCH: 30.4 pg (ref 26.0–34.0)
MCV: 93 fL (ref 78.0–100.0)
Platelets: 403 10*3/uL — ABNORMAL HIGH (ref 150–400)
RBC: 3.72 MIL/uL — ABNORMAL LOW (ref 3.87–5.11)

## 2012-11-05 IMAGING — CR DG CHEST 1V PORT
1 series · 1 of 1 positions shown · non-contrast
Comparison: Yesterday

CLINICAL DATA: Short of breath

PORTABLE CHEST - 1 VIEW

[AP]
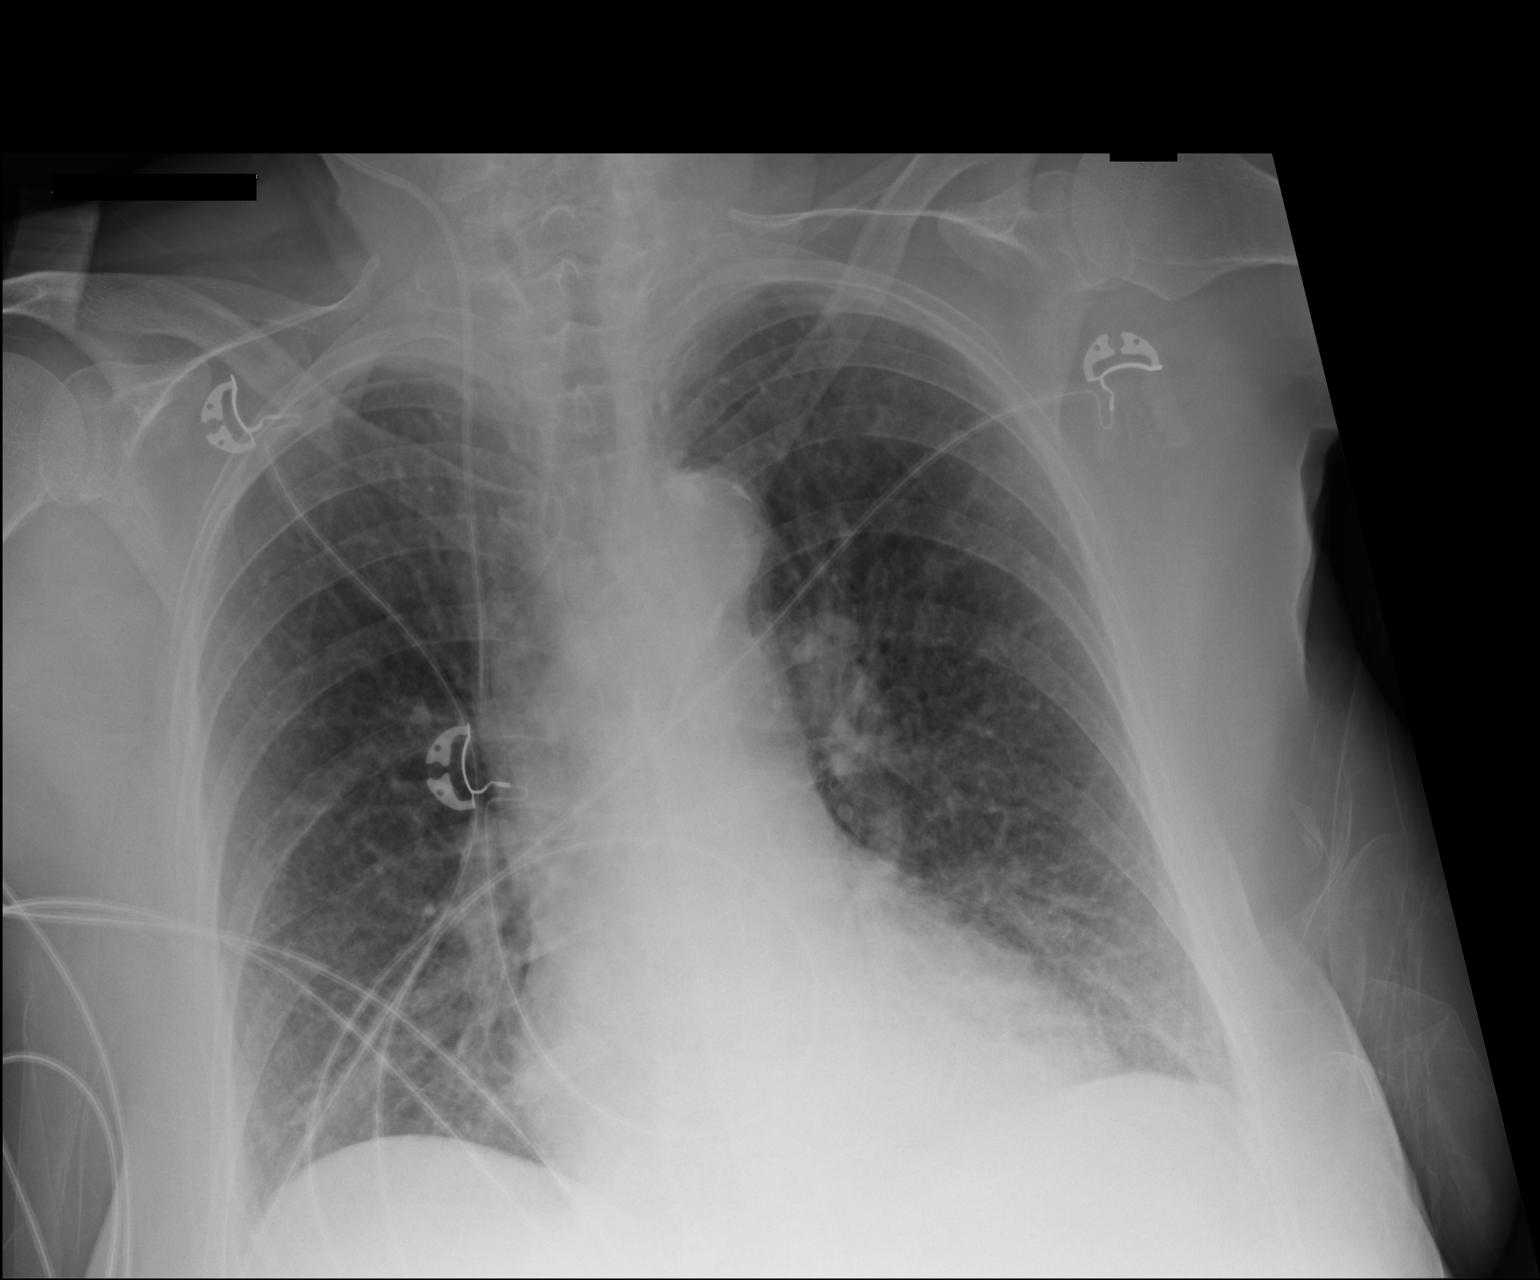

[1 of 1 positions shown; findings below may reference images not displayed]

FINDINGS: Endotracheal and NG tubes removed.  Right internal
jugular central venous catheters stable.  Hazy airspace disease
within the left mid and lower lung zones has improved.  The minimal
atelectasis at the right base.  No pneumothorax.
IMPRESSION: Extubated.

Improved left airspace disease.

## 2012-11-05 MED ORDER — POTASSIUM CHLORIDE CRYS ER 20 MEQ PO TBCR
EXTENDED_RELEASE_TABLET | ORAL | Status: AC
  Administered 2012-11-05: 40 meq via ORAL
  Filled 2012-11-05: qty 2

## 2012-11-05 MED ORDER — METOPROLOL TARTRATE 12.5 MG HALF TABLET
12.5000 mg | ORAL_TABLET | Freq: Two times a day (BID) | ORAL | Status: DC
  Administered 2012-11-05 – 2012-11-09 (×8): 12.5 mg via ORAL
  Filled 2012-11-05 (×11): qty 1

## 2012-11-05 MED ORDER — DEXMEDETOMIDINE HCL IN NACL 200 MCG/50ML IV SOLN
0.2000 ug/kg/h | INTRAVENOUS | Status: DC
  Administered 2012-11-05: 0.2 ug/kg/h via INTRAVENOUS
  Administered 2012-11-06 (×2): 0.5 ug/kg/h via INTRAVENOUS
  Filled 2012-11-05 (×3): qty 50

## 2012-11-05 MED ORDER — ASPIRIN 81 MG PO CHEW
81.0000 mg | CHEWABLE_TABLET | Freq: Every day | ORAL | Status: DC
  Administered 2012-11-06 – 2012-11-09 (×4): 81 mg via ORAL
  Filled 2012-11-05 (×4): qty 1

## 2012-11-05 MED ORDER — POTASSIUM CHLORIDE CRYS ER 20 MEQ PO TBCR: 40.0000 meq | EXTENDED_RELEASE_TABLET | Freq: Once | ORAL | Status: AC

## 2012-11-05 MED ORDER — OLANZAPINE 5 MG PO TBDP
5.0000 mg | ORAL_TABLET | Freq: Every day | ORAL | Status: DC
  Administered 2012-11-05 – 2012-11-08 (×4): 5 mg via ORAL
  Filled 2012-11-05 (×6): qty 1

## 2012-11-05 NOTE — Evaluation (Signed)
Physical Therapy Evaluation Patient Details Name: Samantha King MRN: [id number] DOB: 1946-09-26 Today's Date: 11/05/2012 Time: 2952-8413 PT Time Calculation (min): 23 min  PT Assessment / Plan / Recommendation Clinical Impression  66 yo adm s/p cardiac arrest with CPR, cardiac cath showed occluded RCA with PTCA and stent placed; developed shock and respiratory failure with intubation 12/20-12/26; + flu and pna and now with encephalopathy (including paranoia). Pt highly independent and active PTA. Currently very limited participation due to decr cognition. Will continue to follow and assess d/c needs as appropriate.    PT Assessment  Patient needs continued PT services    Follow Up Recommendations  CIR;Supervision/Assistance - 24 hour    Does the patient have the potential to tolerate intense rehabilitation      Barriers to Discharge Other (comment) (unknown caregiver assist available)      Equipment Recommendations  Other (comment) (TBA as progresses)    Recommendations for Other Services OT consult;Rehab consult   Frequency Min 3X/week    Precautions / Restrictions Restrictions Other Position/Activity Restrictions: RIJ line   Pertinent Vitals/Pain VSS throughout; no indications of pain with full PROM x 4 extremities      Mobility  Bed Mobility Bed Mobility: Not assessed Transfers Transfers: Not assessed Ambulation/Gait Ambulation/Gait Assistance: Not tested (comment)    Shoulder Instructions     Exercises     PT Diagnosis: Generalized weakness;Difficulty walking;Altered mental status  PT Problem List: Decreased strength;Decreased activity tolerance;Decreased mobility;Decreased cognition;Decreased knowledge of use of DME PT Treatment Interventions: DME instruction;Gait training;Functional mobility training;Therapeutic activities;Therapeutic exercise;Balance training;Cognitive remediation;Patient/family education   PT Goals Acute Rehab PT Goals PT Goal  Formulation: Patient unable to participate in goal setting Time For Goal Achievement: 11/19/12 Potential to Achieve Goals: Fair Pt will Roll Supine to Right Side: with supervision;with rail PT Goal: Rolling Supine to Right Side - Progress: Goal set today Pt will Roll Supine to Left Side: with supervision;with rail PT Goal: Rolling Supine to Left Side - Progress: Goal set today Pt will go Supine/Side to Sit: with min assist;with HOB not 0 degrees (comment degree);with rail PT Goal: Supine/Side to Sit - Progress: Goal set today Pt will Sit at Edge of Bed: with supervision;1-2 min;with unilateral upper extremity support PT Goal: Sit at Edge Of Bed - Progress: Goal set today Pt will go Sit to Supine/Side: with min assist;with HOB 0 degrees PT Goal: Sit to Supine/Side - Progress: Goal set today Pt will go Sit to Stand: with +2 total assist;with upper extremity assist (pt = 70%) PT Goal: Sit to Stand - Progress: Goal set today Pt will go Stand to Sit: with min assist;with upper extremity assist PT Goal: Stand to Sit - Progress: Goal set today Pt will Transfer Bed to Chair/Chair to Bed: with +2 total assist (pt=70%) PT Transfer Goal: Bed to Chair/Chair to Bed - Progress: Goal set today Pt will Perform Home Exercise Program: with supervision, verbal cues required/provided PT Goal: Perform Home Exercise Program - Progress: Goal set today Additional Goals Additional Goal #1: Pt able to maintain selective attention x 15 minutes during session. PT Goal: Additional Goal #1 - Progress: Goal set today  Visit Information  Last PT Received On: 11/05/12 Assistance Needed: +2    Subjective Data  Subjective: "Samantha King" (very delayed response) Patient Stated Goal: unable to get an answer   Prior Functioning  Home Living Lives With: Spouse Available Help at Discharge: Family Type of Home: House Home Access: Stairs to enter [employer] of Steps: 4 Entrance  Stairs-Rails: None (son plans to  install one) Home Layout: One level;Other (Comment) (basement, but does not use) Home Adaptive Equipment: None (husband may have RW from prior surgery) Additional Comments: 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; 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 at times willfully ignoring questions)    Cognition  Overall Cognitive Status: Impaired Area of Impairment: Attention;Memory;Following commands;Safety/judgement Arousal/Alertness: Lethargic Orientation Level: Other (comment) (oriented to person only) Behavior During Session: Lethargic Current Attention Level: Focused Memory Deficits: unable to recall what staff and family have told her about where she is and why Following Commands: Follows one step commands inconsistently;Follows one step commands with increased time Cognition - Other Comments: hallucinating/paranoia "You're the one who kills people"    Extremity/Trunk Assessment Right Upper Extremity Assessment RUE ROM/Strength/Tone: Deficits RUE ROM/Strength/Tone Deficits: PROM WFL with shoulder flexion to 90 only due to IJ line; pt actively resisted elbow flexion 4/5, actively internally rotated from position of ER Left Upper Extremity Assessment LUE ROM/Strength/Tone: Deficits LUE ROM/Strength/Tone Deficits: PROM WFL; actively lifted arm x 1; resisted elbow extension 4/5 Right Lower Extremity Assessment RLE ROM/Strength/Tone: Deficits;Unable to fully assess;Due to impaired cognition RLE ROM/Strength/Tone Deficits: PROM WFL; pt actively assisted with some movements and resisted others; hip flexion 2+/5, hip extension 4/5, ankle DF 2+/5, plantarflexion 3+/5 Left Lower Extremity Assessment LLE ROM/Strength/Tone: Deficits;Unable to fully assess;Due to impaired cognition LLE ROM/Strength/Tone Deficits:  PROM WFL; pt actively assisted with some movements and resisted others; hip flexion 2+/5, hip extension 4/5, ankle DF 2+/5, plantarflexion 3+/5   Balance    End of Session PT - End of Session Equipment Utilized During Treatment: Oxygen Activity Tolerance: Treatment limited secondary to agitation Patient left: in bed;with family/visitor present Nurse Communication: Other (comment) (pt's resistance to movement )  GP     Samantha King 11/05/2012, 1:07 PM  Pager [phone]

## 2012-11-05 NOTE — Progress Notes (Signed)
PULMONARY  / CRITICAL CARE MEDICINE  Name: Samantha King MRN: [id number] DOB: 03/30/1946    LOS: 7  REFERRING MD :  Clifton James  CHIEF COMPLAINT:  Respiratory failure   BRIEF PATIENT DESCRIPTION:  66 year old female smoker admitted on 12/20 after VF arrest in setting of  STEMI. Had < 1 minute of CPR. Was oriented on arrival to ER. Went to cath lab where was found to have occluded RCA. Developed shock and hypoperfusion during case. Intubated. DES placed in RCA. IABP placed, PCCM asked to see.   LINES / TUBES: oett 12/20>>> Right fem venous sheath 12/20>>> Right fem IABP 12/20>>>out Left art sheath 12/20>>>out  CULTURES: Sputum 12/20>>>oral flora Influenza PCR 12/20>>>Pos influenza A, H1N1 Urine strep 12/20>>>negative Urine legionella 12/20>>>negative  ANTIBIOTICS: tamiflu 12/20(H1N1 pos)>>> unasyn 12/21 (CAP)>>  SIGNIFICANT EVENTS:  Cardiac cath 12/20: Occluded proximal RCA now s/p PTCA/DES x 1   LEVEL OF CARE:  ICU  PRIMARY SERVICE:  PCCM CONSULTANTS:  PCCM  CODE STATUS: full  DIET: start TF 12/22 DVT Px:  integrilin  GI Px:  PPI    INTERVAL HISTORY:  Tolerating pressure support.  Agitated with WUA.   VITAL SIGNS: Temp:  [95.7 F (35.4 C)-99.5 F (37.5 C)] 99.5 F (37.5 C) (12/27 1000) Pulse Rate:  [75-108] 92  (12/27 1000) Resp:  [14-26] 21  (12/27 1000) BP: (114-165)/(72-110) 139/80 mmHg (12/27 1000) SpO2:  [84 %-98 %] 95 % (12/27 1000) Weight:  [82.3 kg (181 lb 7 oz)] 82.3 kg (181 lb 7 oz) (12/27 0500) HEMODYNAMICS: CVP:  [5 mmHg-8 mmHg] 5 mmHg VENTILATOR SETTINGS:    INTAKE / OUTPUT: Intake/Output      12/26 0701 - 12/27 0700 12/27 0701 - 12/28 0700   P.O. 60 240   I.V. (mL/kg) 947.5 (11.5) 110.1 (1.3)   NG/GT 135    IV Piggyback 150 50   Total Intake(mL/kg) 1292.5 (15.7) 400.1 (4.9)   Urine (mL/kg/hr) 7830 (4) 400 (1.1)   Total Output 7830 400   Net -6537.5 +0.1        Urine Occurrence 1 x     PHYSICAL EXAMINATION: General:  Confused,  paranoid. Neuro:  Moves all extremities, no following commands HEENT: [place]/AT, PERRL, EOM-I and MMM. Cardiovascular:  s1s2 regular, no murmur Lungs:  Scattered rhonchi, no wheeze Abdomen:  Soft, non tender Musculoskeletal:  No edema, foot drop boots in place Skin:  No rashes  LABS: Cbc  Lab 11/05/12 0500 11/04/12 0418 11/03/12 0345  WBC 20.9* -- --  HGB 11.3* 9.8* 9.8*  HCT 34.6* 30.0* 30.0*  PLT 403* 301 265   Chemistry  Lab 11/05/12 0500 11/04/12 0418 11/03/12 0345 11/02/12 0415  NA 139 143 143 --  K 3.4* 4.0 4.0 --  CL 96 106 106 --  CO2 31 32 30 --  BUN 21 32* 30* --  CREATININE 0.57 0.59 0.66 --  CALCIUM 9.4 9.0 9.2 --  MG 2.3 -- 2.0 2.2  PHOS 2.7 -- 2.9 2.4  GLUCOSE 103* 120* 140* --   Liver fxn  Lab 11/02/12 0415 10/30/12 0410 10/29/12 1215  AST 38* 355* --  ALT 33 88* --  ALKPHOS 73 76 89  BILITOT 0.2* 0.2* --  PROT 5.3* 5.3* --  ALBUMIN 2.2* 2.8* --   coags No results found for this basename: APTT:3,INR:3 in the last 168 hours Sepsis markers  Lab 10/31/12 0357 10/30/12 0500 10/30/12 0420 10/29/12 1300 10/29/12 1231  LATICACIDVEN -- -- 1.6 3.0* --  PROCALCITON 0.64 1.19 -- -- <  0.10   Cardiac markers  Lab 10/31/12 0300 10/30/12 2115 10/30/12 1530  CKTOTAL -- -- --  CKMB -- -- --  TROPONINI >20.00* >20.00* >20.00*   BNP  Lab 10/30/12 0410  PROBNP 1268.0*   ABG  Lab 11/03/12 0600 11/02/12 0432 10/31/12 0423  PHART 7.422 7.354 7.389  PCO2ART 44.9 43.6 34.8*  PO2ART 64.6* 73.0* 101.0*  HCO3 28.7* 23.7 20.4  TCO2 30.1 25.0 21.5   CBG trend  Lab 11/05/12 0530 11/05/12 0003 11/04/12 2031 11/04/12 1555 11/04/12 1205  GLUCAP 106* 123* 128* 130* 123*    Intake/Output Summary (Last 24 hours) at 11/05/12 1131 Last data filed at 11/05/12 1000  Gross per 24 hour  Intake 1360.81 ml  Output   6505 ml  Net -5144.19 ml   IMAGING:  Dg Chest Port 1 View  11/05/2012  *RADIOLOGY REPORT*  Clinical Data: Short of breath  PORTABLE CHEST - 1 VIEW   Comparison: Yesterday  Findings: Endotracheal and NG tubes removed.  Right internal jugular central venous catheters stable.  Hazy airspace disease within the left mid and lower lung zones has improved.  The minimal atelectasis at the right base.  No pneumothorax.  IMPRESSION: Extubated.  Improved left airspace disease.   Original Report Authenticated By: Jolaine Click, M.D.    Dg Chest Port 1 View  11/04/2012  *RADIOLOGY REPORT*  Clinical Data: Pulmonary infiltrates.  Check endotracheal tube position.  PORTABLE CHEST - 1 VIEW  Comparison: One-view chest 11/03/2012.  Findings: The endotracheal tube terminates 3.5 cm above the carina, in satisfactory position.  A right IJ line stable.  The NG tube courses off the inferior border of the film.  This left basilar airspace disease is stable.  Mild pulmonary congestion is unchanged.  The visualized soft tissues and bony thorax are unremarkable.  IMPRESSION:  1.  Persistent asymmetric left basilar airspace disease.  This is concerning for pneumonia. 2.  Stable mild pulmonary vascular congestion. 3.  Satisfactory positioning of the endotracheal tube.   Original Report Authenticated By: Marin Roberts, M.D.    DIAGNOSES: Active Problems:  Hypertension  GERD (gastroesophageal reflux disease)  Acute respiratory failure  Cardiogenic shock  Cardiac arrest  ST elevation myocardial infarction (STEMI) of inferior wall  H1N1 influenza with pneumonia  Acute blood loss anemia  Hypokalemia  ASSESSMENT / PLAN:  PULMONARY  ASSESSMENT: Acute respiratory failure Mostly in the setting of shock and hypoperfusion.  H1N1 and prob PNA LLL PLAN:   - Extubated but remains tenuous at best with confusion. - F/u CXR. - D/C steroids. - Continue BD's with xopenex due to tachyarrhythmias.  CARDIOVASCULAR  ASSESSMENT:  Acute anterior STEMI  S/p cardiac cath with occluded RCA. s/p PTCA/DES X1 on 12/20 Cardiogenic shock Resolved. CHB Runs of Vtach now  stable  PLAN:  - Hold for further diureses for today. - Amiodarone per cardiology. - Continue lipitor, asa, effient. - Defer timing of adding beta blocker/ACE to cardiology.  RENAL  ASSESSMENT:  Metabolic acidosis (positive anion gap c/w lactic acidosis). Resolved.  PLAN:   - Monitor urine outpt, renal fx, electrolytes.  GASTROINTESTINAL  ASSESSMENT:   Nutrition. Stool occult blood positive with hx of PUD. PLAN:   - D/C TF. - Continue BID protonix.  HEMATOLOGIC  ASSESSMENT:   Anemia. PLAN:  - F/u CBC. - Transfuse for Hb < 8 in setting of ACS.  INFECTIOUS  ASSESSMENT:   H1N1 positive with LLL PNA.  PLAN:   Continue tamiflu and unasyn.  ENDOCRINE  ASSESSMENT:  Hyperglycemia  PLAN:   - SSI  NEUROLOGIC  ASSESSMENT:  Acute encephalopathy 2nd to cardiac arrest, shock, respiratory failure. CT head 12/24 re-assuring.  PLAN:   D/C sedation for extubation today.  GLOBAL - updated husband at bedside   Critical care time 35 minutes.  Alyson Reedy, M.D. [facility] Pulmonary/Critical Care Medicine. Pager: [phone]. After hours pager: [phone].

## 2012-11-05 NOTE — Progress Notes (Signed)
Pt is c/o hallucinations and very anxious. MD notified, medication administered. VSS, will continue to monitor.

## 2012-11-05 NOTE — Progress Notes (Addendum)
Patient Name: Samantha King Date of Encounter: 11/05/2012     Active Problems:  Hypertension  GERD (gastroesophageal reflux disease)  Acute respiratory failure  Cardiogenic shock  Cardiac arrest  ST elevation myocardial infarction (STEMI) of inferior wall  H1N1 influenza with pneumonia  Acute blood loss anemia    SUBJECTIVE  Extubated.  Talking but also seeing some things.  Some symptoms similar to prior MI, but she does not appear in distress  CURRENT MEDS    . ampicillin-sulbactam (UNASYN) IV  1.5 g Intravenous Q8H  . antiseptic oral rinse  15 mL Mouth Rinse QID  . aspirin  81 mg Per NG tube Daily  . atorvastatin  80 mg Per NG tube q1800  . chlorhexidine  15 mL Mouth Rinse BID  . enoxaparin (LOVENOX) injection  40 mg Subcutaneous Q24H  . feeding supplement (JEVITY 1.2 CAL)  1,000 mL Per Tube Q24H  . insulin aspart  0-20 Units Subcutaneous Q4H  . levalbuterol  0.63 mg Nebulization Q6H  . methylPREDNISolone (SOLU-MEDROL) injection  20 mg Intravenous Q12H  . multivitamin  5 mL Per Tube Daily  . oseltamivir  75 mg Oral BID  . pantoprazole (PROTONIX) IV  40 mg Intravenous QHS  . prasugrel  10 mg Per NG tube Daily  . sodium chloride  3 mL Intravenous Q12H    OBJECTIVE  Filed Vitals:   11/05/12 0400 11/05/12 0500 11/05/12 0600 11/05/12 0700  BP: 126/82 135/82 134/83   Pulse: 86 95 83 75  Temp: 95.7 F (35.4 C) 98.6 F (37 C) 98.8 F (37.1 C) 99 F (37.2 C)  TempSrc:      Resp: 16 21 18 18   Height:      Weight:  181 lb 7 oz (82.3 kg)    SpO2: 98% 84% 96% 94%    Intake/Output Summary (Last 24 hours) at 11/05/12 0749 Last data filed at 11/05/12 0600  Gross per 24 hour  Intake 1215.81 ml  Output   7480 ml  Net -6264.19 ml   Filed Weights   11/03/12 0500 11/04/12 0431 11/05/12 0500  Weight: 186 lb 1.1 oz (84.4 kg) 189 lb 6 oz (85.9 kg) 181 lb 7 oz (82.3 kg)    PHYSICAL EXAM  General: Pleasant, NAD.  Talks to me.   Neuro:  Not out but not  particularly all that alert either.  . Moves all extremities spontaneously. Psych: Normal affect. HEENT:  Normal  Neck: Supple without bruits or JVD. Lungs: Basilar ronchii Heart: RRR no s3, s4, or murmurs. Abdomen: Soft, non-tender, non-distended, BS + x 4.  Extremities: No clubbing, cyanosis or edema. DP/PT/Radials 2+ and equal bilaterally.  Accessory Clinical Findings  CBC  Basename 11/05/12 0500 11/04/12 0418  WBC 20.9* 14.1*  NEUTROABS -- --  HGB 11.3* 9.8*  HCT 34.6* 30.0*  MCV 93.0 94.0  PLT 403* 301   Basic Metabolic Panel  Basename 11/05/12 0500 11/04/12 0418 11/03/12 0345  NA 139 143 --  K 3.4* 4.0 --  CL 96 106 --  CO2 31 32 --  GLUCOSE 103* 120* --  BUN 21 32* --  CREATININE 0.57 0.59 --  CALCIUM 9.4 9.0 --  MG 2.3 -- 2.0  PHOS 2.7 -- 2.9   Liver Function Tests No results found for this basename: AST:2,ALT:2,ALKPHOS:2,BILITOT:2,PROT:2,ALBUMIN:2 in the last 72 hours No results found for this basename: LIPASE:2,AMYLASE:2 in the last 72 hours Cardiac Enzymes No results found for this basename: CKTOTAL:3,CKMB:3,CKMBINDEX:3,TROPONINI:3 in the last 72 hours BNP No components  found with this basename: POCBNP:3 D-Dimer No results found for this basename: DDIMER:2 in the last 72 hours Hemoglobin A1C No results found for this basename: HGBA1C in the last 72 hours Fasting Lipid Panel No results found for this basename: CHOL,HDL,LDLCALC,TRIG,CHOLHDL,LDLDIRECT in the last 72 hours Thyroid Function Tests No results found for this basename: TSH,T4TOTAL,FREET3,T3FREE,THYROIDAB in the last 72 hours    Radiology/Studies  Ct Abdomen Pelvis Wo Contrast  11/01/2012  *RADIOLOGY REPORT*  Clinical Data: Decreased hemoglobin, post cardiac catheterization 10/30/12 with bilateral femoral catheter placement.  CT ABDOMEN AND PELVIS WITHOUT CONTRAST  Technique:  Multidetector CT imaging of the abdomen and pelvis was performed following the standard protocol without intravenous  contrast.  Comparison: None.  Findings: Small bilateral pleural effusions with associated compressive atelectasis noted.  Nasogastric tube terminates within the stomach.  Presumed vicarious excretion of contrast into the gallbladder is noted.  There is low density, 5 HU, small amount of fluid in the gallbladder fossa and also small amount of low density pelvic free fluid.  There is a trace amount of fluid tracking superiorly from the right groin along the anterior aspect of the psoas muscle which measures fluid density. Small amount of bilateral flank subcutaneous soft tissue edema or stranding is noted.  Small fat containing left inguinal hernia.  No collection is seen in either inguinal region.  Unenhanced liver, right adrenal gland, kidneys, spleen, and pancreas are unremarkable.  There is a 1.1 cm mass like lesion in the left adrenal gland measuring 14 HU, compatible with an adenoma. No free air.  No bowel wall thickening or focal segmental dilatation.  Fat containing umbilical hernia noted. Bladder is decompressed with a Foley catheter in place.  Uterus and ovaries are normal.  No acute osseous finding. Disc degenerative changes are noted in the lumbar spine, including 4 mm anterolisthesis of L4 on L5.  IMPRESSION: Trace low density fluid extending from the right groin along the anterior margin of the psoas muscle.  Although it is difficult to obtain accurate Hounsfield unit measurements to determine the composition of the fluid, areas measured are lower than expected for blood products, and if this is a retroperitoneal hematoma, it is extremely small.  Small amount of fluid around the gallbladder, of unclear etiology. This may be seen with systemic hypoproteinemia, cholecystitis, or occasionally liver disease.  No small amount of subcutaneous edema/stranding.  Small bilateral pleural effusions.  Constellation of findings suggests mild third spacing and possible volume overload.   Original Report Authenticated  By: Christiana Pellant, M.D.    Dg Chest Port 1 View  11/05/2012  *RADIOLOGY REPORT*  Clinical Data: Short of breath  PORTABLE CHEST - 1 VIEW  Comparison: Yesterday  Findings: Endotracheal and NG tubes removed.  Right internal jugular central venous catheters stable.  Hazy airspace disease within the left mid and lower lung zones has improved.  The minimal atelectasis at the right base.  No pneumothorax.  IMPRESSION: Extubated.  Improved left airspace disease.   Original Report Authenticated By: Jolaine Click, M.D.    Dg Chest Port 1 View  11/04/2012  *RADIOLOGY REPORT*  Clinical Data: Pulmonary infiltrates.  Check endotracheal tube position.  PORTABLE CHEST - 1 VIEW  Comparison: One-view chest 11/03/2012.  Findings: The endotracheal tube terminates 3.5 cm above the carina, in satisfactory position.  A right IJ line stable.  The NG tube courses off the inferior border of the film.  This left basilar airspace disease is stable.  Mild pulmonary congestion is unchanged.  The  visualized soft tissues and bony thorax are unremarkable.  IMPRESSION:  1.  Persistent asymmetric left basilar airspace disease.  This is concerning for pneumonia. 2.  Stable mild pulmonary vascular congestion. 3.  Satisfactory positioning of the endotracheal tube.   Original Report Authenticated By: Marin Roberts, M.D.    Dg Chest Port 1 View  11/03/2012  *RADIOLOGY REPORT*  Clinical Data: Endotracheal tube position, follow-up  PORTABLE CHEST - 1 VIEW  Comparison: Portable chest x-ray of 11/02/2012  Findings: The tip of the endotracheal tube is approximally 4.2 cm above the carina.  There does appear to be pulmonary vascular congestion present with left basilar atelectasis.  Mild cardiomegaly is stable.  The right central venous line is unchanged in position and an NG tube is noted into the stomach.  IMPRESSION:  1.  Tip of endotracheal tube 4.2 cm above the carina. 2.  Some worsening of pulmonary vascular congestion and basilar  atelectasis.   Original Report Authenticated By: Dwyane Dee, M.D.    Dg Chest Port 1 View  11/02/2012  *RADIOLOGY REPORT*  Clinical Data: Respiratory difficulty  PORTABLE CHEST - 1 VIEW  Comparison: Yesterday  Findings: Stable tubular devices.  Vascular congestion improved. Bibasilar airspace disease improved.  No pneumothorax.  IMPRESSION: Improved vascular congestion and bibasilar airspace disease.   Original Report Authenticated By: Jolaine Click, M.D.    Dg Chest Port 1 View  11/01/2012  *RADIOLOGY REPORT*  Clinical Data: Respiratory distress.  PORTABLE CHEST - 1 VIEW  Comparison: 10/31/2012.  Findings: The endotracheal tube, NG tube and right IJ catheters are stable.  The IABP has been removed.  The cardiac silhouette, mediastinal and hilar contours are stable.  There is vascular congestion and slight increase and bibasilar atelectasis.  Probable small pleural effusions.  IMPRESSION:  1.  Stable support apparatus except the IABP has been removed. 2.  Slight increase in vascular congestion and bibasilar atelectasis.   Original Report Authenticated By: Rudie Meyer, M.D.    Dg Chest Port 1 View  10/31/2012  *RADIOLOGY REPORT*  Clinical Data: Cough, congestion, flu  PORTABLE CHEST - 1 VIEW  Comparison: 10/30/2012; 10/29/2012  Findings: Grossly unchanged cardiac silhouette and mediastinal contours.  Stable position of support apparatus including tip of intra-aortic balloon pump approximately 4 cm from the superior aspect of the aortic arch.  Pulmonary vasculature remains indistinct, left greater than right.  Grossly unchanged bibasilar opacities, left greater than right.  No new focal airspace opacity. No definite pleural effusion, though note, the left costophrenic angle is excluded view.  No pneumothorax.  Unchanged bones.  IMPRESSION: 1.  Stable positioning of support apparatus.  No pneumothorax. 2.  Grossly unchanged findings most suggestive of asymmetric pulmonary edema.   Original Report  Authenticated By: Tacey Ruiz, MD    Portable Chest Xray In Am  10/30/2012  *RADIOLOGY REPORT*  Clinical Data: Intubation, follow-up  PORTABLE CHEST - 1 VIEW  Comparison: Portable chest x-ray of 10/29/2012  Findings: The carina is difficult to visualize on this rotated patient, but the tip of endotracheal tube appears to be approximately 3.7 cm above the carina.  There is minimal pulmonary vascular congestion present.  Right central venous line tip overlies the mid upper SVC.  No pneumothorax is seen.  The aortic balloon tip overlies the lower aortic knob.  IMPRESSION: Little change in aeration with mild pulmonary vascular congestion.   Original Report Authenticated By: Dwyane Dee, M.D.    Dg Chest Port 1 View  10/29/2012  *RADIOLOGY REPORT*  Clinical Data: Respiratory difficulty  PORTABLE CHEST - 1 VIEW  Comparison: None.  Findings: Endotracheal tube is 3.9 cm from the carina.  NG tube is beyond the gastroesophageal junction.  Transvenous pacemaker from the IVC in place with its tip projecting over the tip of the right ventricle.  Right internal jugular vein center venous catheter tip in the mid SVC.  Intra-aortic balloon pump tip in the proximal descending aorta.  Low lung volumes.  Normal heart size.  No pneumothorax.  Minimal patchy density at the lung bases.  IMPRESSION: Tubular structures, transvenous pacer and intra-aortic balloon pump appropriately positioned.  Bibasilar atelectasis verses airspace disease.   Original Report Authenticated By: Jolaine Click, M.D.    Ct Portable Head W/o Cm  11/02/2012  *RADIOLOGY REPORT*  Clinical Data: Altered mental status.  CT HEAD WITHOUT CONTRAST  Technique:  Contiguous axial images were obtained from the base of the skull through the vertex without contrast.  Comparison: None.  Findings: No mass lesion, mass effect, midline shift, hydrocephalus, hemorrhage.  No territorial ischemia or acute infarction.  Allowing for portable technique, gray white  differentiation appears preserved.  Low attenuation adjacent to the frontal horns of the lateral ventricles is compatible with chronic ischemic disease. Fluid is present in the left mastoid air cells.  IMPRESSION: No acute intracranial abnormality.   Original Report Authenticated By: Andreas Newport, M.D.     ASSESSMENT AND PLAN  Sp MI with PCI complicated by cardiogenic shock requiring IABP ICU encephalopathy-- improving Mild volume overload -----excessive diuresis. CVP 5 Hypokalemia secondary to diuretics.      Plan  Try to get up in chair today.  Advance diet as tolerated.   Continue IV amio until tomorrow with hope of transition tomorrow.  Start low dose beta blockade Replace K   Signed, Shawnie Pons MD, [facility], [facility]

## 2012-11-05 NOTE — Progress Notes (Signed)
eLink Physician-Brief Progress Note Patient Name: Samantha King DOB: Aug 06, 1946 MRN: [id number]  Date of Service  11/05/2012   HPI/Events of Note  Continued issues with paranoia/delirium.  Not excessively agitated but is refusing meds/interventions/oxygen.  Camera evaluation shows patient in bed constantly talking to self - states that health care workers are "going to kill me"  Received 5 mg zyprexa today - had haldol 5 mg IV last pm.  QTc on 12/22 EKG was 498.   eICU Interventions  Plan: Discussed medication options with pharmacy.  At this point feel that antipsychotics are limited due to prolonged QTc.  Will try precedex for now and see if we can control her delirium.   Intervention Category Major Interventions: Delirium, psychosis, severe agitation - evaluation and management  Gracia Saggese 11/05/2012, 10:10 PM

## 2012-11-05 NOTE — Progress Notes (Signed)
Rehab Admissions Coordinator Note:  Patient was screened by Trish Mage for appropriateness for an Inpatient Acute Rehab Consult.  At this time, I am deferring decision until I can see patient participate more with therapies.  I will re- look at patient on Monday. Trish Mage 11/05/2012, 3:37 PM  I can be reached at [phone].

## 2012-11-05 NOTE — Progress Notes (Signed)
Pt is confused and pulling at dressings and lines. Pt d/c central line dressing, new dressing reapplied using sterile technique. Pt educated on the importance of the central line dressing. Will continue to monitor. VSS.

## 2012-11-06 DIAGNOSIS — I469 Cardiac arrest, cause unspecified: Secondary | ICD-10-CM | POA: Diagnosis not present

## 2012-11-06 DIAGNOSIS — J9600 Acute respiratory failure, unspecified whether with hypoxia or hypercapnia: Secondary | ICD-10-CM | POA: Diagnosis not present

## 2012-11-06 DIAGNOSIS — I4901 Ventricular fibrillation: Secondary | ICD-10-CM | POA: Diagnosis not present

## 2012-11-06 DIAGNOSIS — R570 Cardiogenic shock: Secondary | ICD-10-CM | POA: Diagnosis not present

## 2012-11-06 DIAGNOSIS — D62 Acute posthemorrhagic anemia: Secondary | ICD-10-CM | POA: Diagnosis not present

## 2012-11-06 DIAGNOSIS — I2119 ST elevation (STEMI) myocardial infarction involving other coronary artery of inferior wall: Secondary | ICD-10-CM | POA: Diagnosis not present

## 2012-11-06 DIAGNOSIS — J95821 Acute postprocedural respiratory failure: Secondary | ICD-10-CM | POA: Diagnosis not present

## 2012-11-06 DIAGNOSIS — J1000 Influenza due to other identified influenza virus with unspecified type of pneumonia: Secondary | ICD-10-CM | POA: Diagnosis not present

## 2012-11-06 LAB — BASIC METABOLIC PANEL
CO2: 32 mEq/L (ref 19–32)
Calcium: 9 mg/dL (ref 8.4–10.5)
Chloride: 99 mEq/L (ref 96–112)
Creatinine, Ser: 0.82 mg/dL (ref 0.50–1.10)
Glucose, Bld: 107 mg/dL — ABNORMAL HIGH (ref 70–99)

## 2012-11-06 LAB — CBC
HCT: 34.4 % — ABNORMAL LOW (ref 36.0–46.0)
MCH: 30.4 pg (ref 26.0–34.0)
MCV: 93.2 fL (ref 78.0–100.0)
Platelets: 410 10*3/uL — ABNORMAL HIGH (ref 150–400)
RDW: 14.6 % (ref 11.5–15.5)

## 2012-11-06 MED ORDER — POTASSIUM CHLORIDE CRYS ER 20 MEQ PO TBCR
40.0000 meq | EXTENDED_RELEASE_TABLET | Freq: Once | ORAL | Status: AC
  Administered 2012-11-05: 40 meq via ORAL
  Filled 2012-11-06: qty 2

## 2012-11-06 MED ORDER — AMOXICILLIN-POT CLAVULANATE 875-125 MG PO TABS
1.0000 | ORAL_TABLET | Freq: Two times a day (BID) | ORAL | Status: DC
  Administered 2012-11-06 – 2012-11-09 (×7): 1 via ORAL
  Filled 2012-11-06 (×9): qty 1

## 2012-11-06 MED ORDER — PANTOPRAZOLE SODIUM 40 MG PO TBEC
40.0000 mg | DELAYED_RELEASE_TABLET | Freq: Every day | ORAL | Status: DC
  Administered 2012-11-06 – 2012-11-09 (×4): 40 mg via ORAL
  Filled 2012-11-06 (×4): qty 1

## 2012-11-06 NOTE — Progress Notes (Addendum)
SUBJECTIVE: The patient is very confused today.  At this time, she denies chest pain, shortness of breath, or any new concerns.  She has significant delirium.       [name] ampicillin-sulbactam (UNASYN) IV  1.5 g Intravenous Q8H  . antiseptic oral rinse  15 mL Mouth Rinse QID  . aspirin  81 mg Oral Daily  . atorvastatin  80 mg Per NG tube q1800  . enoxaparin (LOVENOX) injection  40 mg Subcutaneous Q24H  . levalbuterol  0.63 mg Nebulization Q6H  . metoprolol tartrate  12.5 mg Oral BID  . OLANZapine zydis  5 mg Oral QHS  . oseltamivir  75 mg Oral BID  . pantoprazole (PROTONIX) IV  40 mg Intravenous QHS  . potassium chloride  40 mEq Oral Once  . prasugrel  10 mg Per NG tube Daily  . sodium chloride  3 mL Intravenous Q12H      . sodium chloride 20 mL/hr at 11/03/12 1742  . amiodarone (NEXTERONE PREMIX) 360 mg/200 mL dextrose 30 mg/hr (11/05/12 2230)  . dexmedetomidine 0.5 mcg/kg/hr (11/06/12 0549)    OBJECTIVE: Physical Exam: Filed Vitals:   11/06/12 0740 11/06/12 0750 11/06/12 0800 11/06/12 0810  BP: 94/54 103/65 105/64 106/63  Pulse: 60 61 62 66  Temp: 99.5 F (37.5 C) 99.5 F (37.5 C) 99.5 F (37.5 C) 99.1 F (37.3 C)  TempSrc:      Resp: 21 18 19 22   Height:      Weight:      SpO2: 97% 98% 97% 97%    Intake/Output Summary (Last 24 hours) at 11/06/12 0825 Last data filed at 11/06/12 0600  Gross per 24 hour  Intake 1506.94 ml  Output   3875 ml  Net -2368.06 ml    Telemetry reveals sinus rhythm  GEN- The patient is very confused Head- normocephalic, atraumatic Eyes-  Sclera clear, conjunctiva pink Ears- hearing intact Oropharynx- clear Neck- supple, R IJ catheter is in place Lymph- no cervical lymphadenopathy Lungs- Clear to ausculation bilaterally, normal work of breathing Heart- Regular rate and rhythm, no murmurs, rubs or gallops, PMI not laterally displaced GI- soft, NT, ND, + BS Extremities- no clubbing, cyanosis, or edema Skin- no rash or  lesion Psych- agitated, delirious Neuro- strength and sensation are intact  LABS: Basic Metabolic Panel:  Basename 11/06/12 0455 11/05/12 0500  NA 140 139  K 3.4* 3.4*  CL 99 96  CO2 32 31  GLUCOSE 107* 103*  BUN 22 21  CREATININE 0.82 0.57  CALCIUM 9.0 9.4  MG 2.3 2.3  PHOS 2.8 2.7   Liver Function Tests: No results found for this basename: AST:2,ALT:2,ALKPHOS:2,BILITOT:2,PROT:2,ALBUMIN:2 in the last 72 hours No results found for this basename: LIPASE:2,AMYLASE:2 in the last 72 hours CBC:  Basename 11/06/12 0455 11/05/12 0500  WBC 20.2* 20.9*  NEUTROABS -- --  HGB 11.2* 11.3*  HCT 34.4* 34.6*  MCV 93.2 93.0  PLT 410* 403*  ASSESSMENT AND PLAN:  Active Problems:  Hypertension  GERD (gastroesophageal reflux disease)  Acute respiratory failure  Cardiogenic shock  Cardiac arrest  ST elevation myocardial infarction (STEMI) of inferior wall  H1N1 influenza with pneumonia  Acute blood loss anemia  Hypokalemia  1. sp MI with PCI complicated by cardiogenic shock requiring IABP  Clinically improving,  Will need to add ace inhibitor as BP allow Continue ASA, effient, statin, metoprolol  2. VF arrest due to ischemic event, now quiescent on amiodarone s/p PCI Stop amiodarone and monitor Titrate beta blocker as BP  allows Avoid QT prolonging medicines  3. ICU encephalopathy-- improving  Will need to wean off precidex Consider psych consult if not improved  4. Mild volume overload- euvolemic presently  5. Hypokalemia- replete  6. Anemia- follow  7. Respiratory  H1N1 positive with LLL PNA. Per PCCM continue tamiflu and unasyn.   Transfer to stepdown once off of precidex   Hillis Range, MD 11/06/2012 8:25 AM

## 2012-11-06 NOTE — Progress Notes (Signed)
PULMONARY  / CRITICAL CARE MEDICINE  Name: Samantha King MRN: [id number] DOB: 1946/05/22    LOS: 8  REFERRING MD :  Clifton James  CHIEF COMPLAINT:  Respiratory failure   BRIEF PATIENT DESCRIPTION:  66 year old female smoker admitted on 12/20 after VF arrest in setting of  STEMI. Had < 1 minute of CPR. Was oriented on arrival to ER. Went to cath lab where was found to have occluded RCA. Developed shock and hypoperfusion during case. Intubated. DES placed in RCA. IABP placed, PCCM asked to see.   LINES / TUBES: oett 12/20>>> Right fem venous sheath 12/20>>> Right fem IABP 12/20>>>out Left art sheath 12/20>>>out  CULTURES: Sputum 12/20>>>oral flora Influenza PCR 12/20>>>Pos influenza A, H1N1 Urine strep 12/20>>>negative Urine legionella 12/20>>>negative  ANTIBIOTICS: tamiflu 12/20(H1N1 pos)>>>12/29 unasyn 12/21 (CAP)>>12/28 Augmentin 12/28>>>1/2  SIGNIFICANT EVENTS:  Cardiac cath 12/20: Occluded proximal RCA now s/p PTCA/DES x 1   LEVEL OF CARE:  ICU  PRIMARY SERVICE:  PCCM CONSULTANTS:  PCCM  CODE STATUS: full  DIET: start TF 12/22 DVT Px:  integrilin  GI Px:  PPI   VITAL SIGNS: Temp:  [99 F (37.2 C)-100.9 F (38.3 C)] 99.1 F (37.3 C) (12/28 0840) Pulse Rate:  [55-98] 71  (12/28 0840) Resp:  [17-25] 25  (12/28 0840) BP: (53-143)/(30-96) 111/67 mmHg (12/28 0840) SpO2:  [88 %-100 %] 95 % (12/28 0840) Weight:  [78.8 kg (173 lb 11.6 oz)] 78.8 kg (173 lb 11.6 oz) (12/28 0457) HEMODYNAMICS: CVP:  [2 mmHg-6 mmHg] 2 mmHg VENTILATOR SETTINGS:    INTAKE / OUTPUT: Intake/Output      12/27 0701 - 12/28 0700 12/28 0701 - 12/29 0700   P.O. 540    I.V. (mL/kg) 960.6 (12.2) 60.1 (0.8)   NG/GT     IV Piggyback 150 50   Total Intake(mL/kg) 1650.6 (20.9) 110.1 (1.4)   Urine (mL/kg/hr) 4175 (2.2) 125   Total Output 4175 125   Net -2524.4 -15         PHYSICAL EXAMINATION: General:  Confused, paranoid. Neuro:  Moves all extremities, no following commands HEENT:  [place]/AT, PERRL, EOM-I and MMM. Cardiovascular:  s1s2 regular, no murmur Lungs:  Scattered rhonchi, no wheeze Abdomen:  Soft, non tender Musculoskeletal:  No edema, foot drop boots in place Skin:  No rashes  LABS: Cbc  Lab 11/06/12 0455 11/05/12 0500 11/04/12 0418  WBC 20.2* -- --  HGB 11.2* 11.3* 9.8*  HCT 34.4* 34.6* 30.0*  PLT 410* 403* 301   Chemistry  Lab 11/06/12 0455 11/05/12 0500 11/04/12 0418 11/03/12 0345  NA 140 139 143 --  K 3.4* 3.4* 4.0 --  CL 99 96 106 --  CO2 32 31 32 --  BUN 22 21 32* --  CREATININE 0.82 0.57 0.59 --  CALCIUM 9.0 9.4 9.0 --  MG 2.3 2.3 -- 2.0  PHOS 2.8 2.7 -- 2.9  GLUCOSE 107* 103* 120* --   Liver fxn  Lab 11/02/12 0415  AST 38*  ALT 33  ALKPHOS 73  BILITOT 0.2*  PROT 5.3*  ALBUMIN 2.2*   coags No results found for this basename: APTT:3,INR:3 in the last 168 hours Sepsis markers  Lab 10/31/12 0357  LATICACIDVEN --  PROCALCITON 0.64   Cardiac markers  Lab 10/31/12 0300 10/30/12 2115 10/30/12 1530  CKTOTAL -- -- --  CKMB -- -- --  TROPONINI >20.00* >20.00* >20.00*   BNP No results found for this basename: PROBNP:3 in the last 168 hours ABG  Lab 11/03/12 0600 11/02/12 0432  10/31/12 0423  PHART 7.422 7.354 7.389  PCO2ART 44.9 43.6 34.8*  PO2ART 64.6* 73.0* 101.0*  HCO3 28.7* 23.7 20.4  TCO2 30.1 25.0 21.5   CBG trend  Lab 11/05/12 0828 11/05/12 0530 11/05/12 0003 11/04/12 2031 11/04/12 1555  GLUCAP 105* 106* 123* 128* 130*    Intake/Output Summary (Last 24 hours) at 11/06/12 0903 Last data filed at 11/06/12 0841  Gross per 24 hour  Intake 1517.29 ml  Output   3900 ml  Net -2382.71 ml   IMAGING:  Dg Chest Port 1 View  11/05/2012  *RADIOLOGY REPORT*  Clinical Data: Short of breath  PORTABLE CHEST - 1 VIEW  Comparison: Yesterday  Findings: Endotracheal and NG tubes removed.  Right internal jugular central venous catheters stable.  Hazy airspace disease within the left mid and lower lung zones has improved.  The  minimal atelectasis at the right base.  No pneumothorax.  IMPRESSION: Extubated.  Improved left airspace disease.   Original Report Authenticated By: Jolaine Click, M.D.    DIAGNOSES: Active Problems:  Hypertension  GERD (gastroesophageal reflux disease)  Acute respiratory failure  Cardiogenic shock  Cardiac arrest  ST elevation myocardial infarction (STEMI) of inferior wall  H1N1 influenza with pneumonia  Acute blood loss anemia  Hypokalemia  ASSESSMENT / PLAN:  PULMONARY  ASSESSMENT: Acute respiratory failure Mostly in the setting of shock and hypoperfusion.  H1N1 and prob PNA LLL PLAN:   - Extubated with much improved respiratory status but remains very confused. - F/u CXR. - D/C steroids. - Continue BD's with xopenex due to tachyarrhythmias.  CARDIOVASCULAR  ASSESSMENT:  Acute anterior STEMI  S/p cardiac cath with occluded RCA. s/p PTCA/DES X1 on 12/20 Cardiogenic shock Resolved. CHB Runs of Vtach now stable  PLAN:  - Hold for further diureses for today patient is auto-diuresing. - Amiodarone per cardiology. - Continue lipitor, asa, effient. - Defer timing of adding beta blocker/ACE to cardiology.  RENAL  ASSESSMENT:  Metabolic acidosis (positive anion gap c/w lactic acidosis). Resolved.  PLAN:   - Monitor urine outpt, renal fx, electrolytes. - Replace K.  GASTROINTESTINAL  ASSESSMENT:   Nutrition. Stool occult blood positive with hx of PUD. PLAN:   - Continue BID protonix. - Heart healthy diet.  HEMATOLOGIC  ASSESSMENT:   Anemia. PLAN:  - F/u CBC. - Transfuse for Hb < 8 in setting of ACS.  INFECTIOUS  ASSESSMENT:   H1N1 positive with LLL PNA.  PLAN:   Will D/C tamiflu in AM (10 days total). Change unasyn to augmentin today since patient is taking PO and continue for a total of 14 days (10/14).  ENDOCRINE  ASSESSMENT:   Hyperglycemia  PLAN:   - SSI  NEUROLOGIC  ASSESSMENT:  Acute encephalopathy 2nd to cardiac arrest, shock,  respiratory failure. CT head 12/24 re-assuring. Remains very confused and paranoid with no psychiatric history.  Interestingly she is fully alert and oriented.  PLAN:   - Will attempt to move out of the ICU today given delirium.   - Continue zyprexa. - If remains paranoid will likely need psych to see on Monday given severe paranoia.  GLOBAL - updated husband at bedside, confusion and paranoia remain the only active issue.  Alyson Reedy, M.D. [facility] Pulmonary/Critical Care Medicine. Pager: [phone]. After hours pager: [phone].

## 2012-11-06 NOTE — Progress Notes (Signed)
eLink Physician-Brief Progress Note Patient Name: Samantha King DOB: 03-Aug-1946 MRN: [id number]  Date of Service  11/06/2012   HPI/Events of Note  hypokalemia   eICU Interventions  Potassium replaced   Intervention Category Minor Interventions: Electrolytes abnormality - evaluation and management  DETERDING,ELIZABETH 11/06/2012, 6:02 AM

## 2012-11-06 NOTE — Progress Notes (Signed)
1450 Will begin seeing pt when pt able to ambulate with PT safely. Will continue to follow. Sohrab Keelan DunlapRN

## 2012-11-07 ENCOUNTER — Inpatient Hospital Stay (HOSPITAL_COMMUNITY): Payer: Medicare Other

## 2012-11-07 DIAGNOSIS — J9600 Acute respiratory failure, unspecified whether with hypoxia or hypercapnia: Secondary | ICD-10-CM | POA: Diagnosis not present

## 2012-11-07 DIAGNOSIS — J90 Pleural effusion, not elsewhere classified: Secondary | ICD-10-CM | POA: Diagnosis not present

## 2012-11-07 DIAGNOSIS — I469 Cardiac arrest, cause unspecified: Secondary | ICD-10-CM | POA: Diagnosis not present

## 2012-11-07 DIAGNOSIS — I2119 ST elevation (STEMI) myocardial infarction involving other coronary artery of inferior wall: Secondary | ICD-10-CM | POA: Diagnosis not present

## 2012-11-07 DIAGNOSIS — I10 Essential (primary) hypertension: Secondary | ICD-10-CM | POA: Diagnosis not present

## 2012-11-07 DIAGNOSIS — R570 Cardiogenic shock: Secondary | ICD-10-CM | POA: Diagnosis not present

## 2012-11-07 DIAGNOSIS — I4901 Ventricular fibrillation: Secondary | ICD-10-CM | POA: Diagnosis not present

## 2012-11-07 DIAGNOSIS — J95821 Acute postprocedural respiratory failure: Secondary | ICD-10-CM | POA: Diagnosis not present

## 2012-11-07 DIAGNOSIS — J9819 Other pulmonary collapse: Secondary | ICD-10-CM | POA: Diagnosis not present

## 2012-11-07 DIAGNOSIS — J1000 Influenza due to other identified influenza virus with unspecified type of pneumonia: Secondary | ICD-10-CM | POA: Diagnosis not present

## 2012-11-07 LAB — BASIC METABOLIC PANEL
BUN: 16 mg/dL (ref 6–23)
Creatinine, Ser: 0.62 mg/dL (ref 0.50–1.10)
GFR calc Af Amer: >90 mL/min (ref >90)
GFR calc non Af Amer: >90 mL/min (ref >90)
Potassium: 3.5 mEq/L (ref 3.5–5.1)

## 2012-11-07 LAB — CBC
MCHC: 31.8 g/dL (ref 30.0–36.0)
Platelets: 427 10*3/uL — ABNORMAL HIGH (ref 150–400)
RDW: 14.7 % (ref 11.5–15.5)

## 2012-11-07 LAB — PHOSPHORUS: Phosphorus: 3.2 mg/dL (ref 2.3–4.6)

## 2012-11-07 LAB — MAGNESIUM: Magnesium: 2.3 mg/dL (ref 1.5–2.5)

## 2012-11-07 IMAGING — CR DG CHEST 1V PORT
1 series · 1 of 1 positions shown · non-contrast
Comparison: [DATE]

CLINICAL DATA: Follow up pneumonia

PORTABLE CHEST - 1 VIEW

[AP]
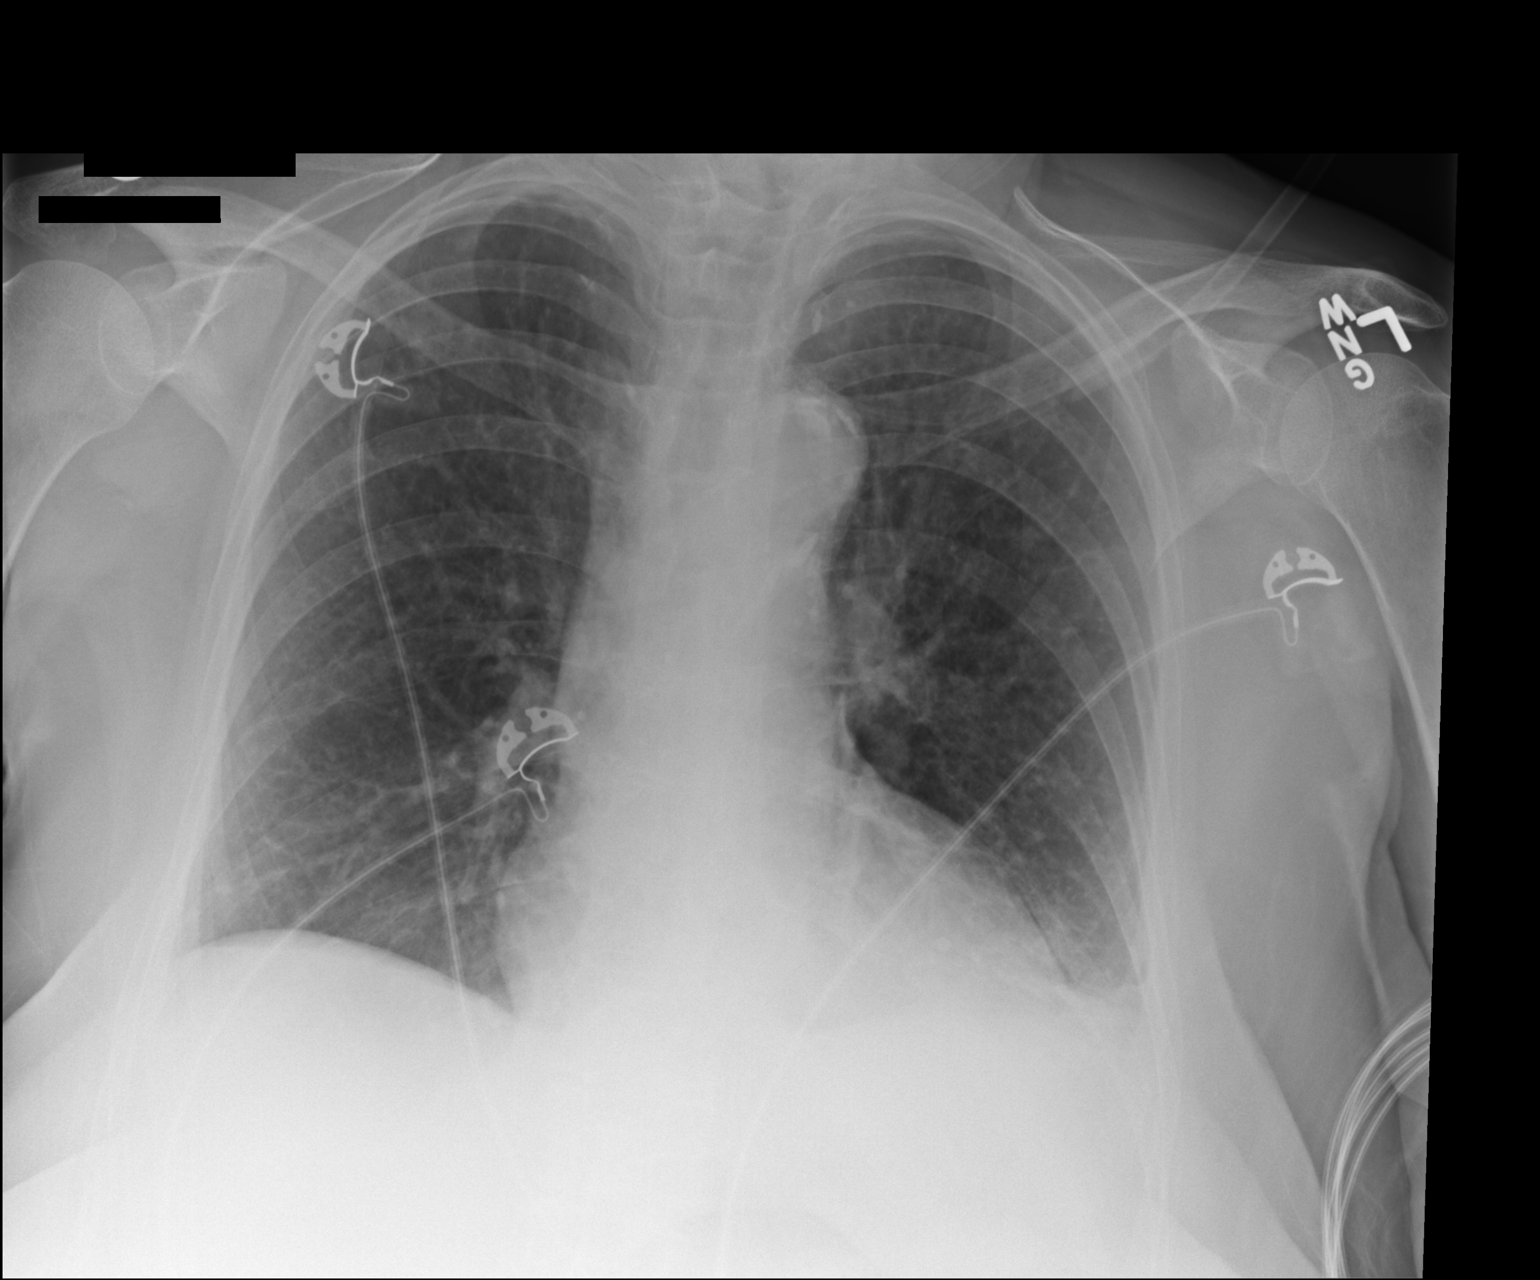

[1 of 1 positions shown; findings below may reference images not displayed]

FINDINGS: Normal heart size.  Small left pleural effusion and left
foot base atelectasis is identified.  This appears similar to
previous exam.  No new findings.
IMPRESSION: 1.  Persistent left pleural effusion and left base atelectasis.

## 2012-11-07 MED ORDER — LISINOPRIL 5 MG PO TABS
5.0000 mg | ORAL_TABLET | Freq: Every day | ORAL | Status: DC
  Administered 2012-11-07 – 2012-11-09 (×3): 5 mg via ORAL
  Filled 2012-11-07 (×3): qty 1

## 2012-11-07 MED ORDER — MORPHINE SULFATE 2 MG/ML IJ SOLN
INTRAMUSCULAR | Status: AC
  Administered 2012-11-07: 2 mg via INTRAMUSCULAR
  Filled 2012-11-07: qty 1

## 2012-11-07 MED ORDER — MORPHINE SULFATE 2 MG/ML IJ SOLN
2.0000 mg | Freq: Once | INTRAMUSCULAR | Status: AC
  Administered 2012-11-07: 2 mg via INTRAVENOUS

## 2012-11-07 MED ORDER — POTASSIUM CHLORIDE CRYS ER 20 MEQ PO TBCR
40.0000 meq | EXTENDED_RELEASE_TABLET | Freq: Once | ORAL | Status: AC
  Administered 2012-11-07: 40 meq via ORAL
  Filled 2012-11-07: qty 2

## 2012-11-07 NOTE — Progress Notes (Signed)
SUBJECTIVE: The patient is less confused today.  She had some chest wall pain overnight, which is improved this am.  At this time, she denies  shortness of breath, or any new concerns.  She has had significant delirium.       [name] amoxicillin-clavulanate  1 tablet Oral BID  . antiseptic oral rinse  15 mL Mouth Rinse QID  . aspirin  81 mg Oral Daily  . atorvastatin  80 mg Per NG tube q1800  . enoxaparin (LOVENOX) injection  40 mg Subcutaneous Q24H  . levalbuterol  0.63 mg Nebulization Q6H  . metoprolol tartrate  12.5 mg Oral BID  . OLANZapine zydis  5 mg Oral QHS  . oseltamivir  75 mg Oral BID  . pantoprazole  40 mg Oral Daily  . prasugrel  10 mg Per NG tube Daily  . sodium chloride  3 mL Intravenous Q12H      . sodium chloride 20 mL/hr at 11/06/12 1400    OBJECTIVE: Physical Exam: Filed Vitals:   11/07/12 0400 11/07/12 0500 11/07/12 0600 11/07/12 0700  BP: 136/82 132/70 127/66 142/66  Pulse: 75 72 70 75  Temp:      TempSrc:      Resp: 19 20 21 20   Height:      Weight:  166 lb 10.7 oz (75.6 kg)    SpO2: 96% 95% 94% 96%    Intake/Output Summary (Last 24 hours) at 11/07/12 7829 Last data filed at 11/07/12 0600  Gross per 24 hour  Intake 1103.05 ml  Output   2000 ml  Net -896.95 ml    Telemetry reveals sinus rhythm  GEN- The patient is alert and less confused Head- normocephalic, atraumatic Eyes-  Sclera clear, conjunctiva pink Ears- hearing intact Oropharynx- clear Neck- supple, R IJ catheter is in place Chest" moderate soreness over her right chest which occurs with chest wall palpation Lungs- Clear to ausculation bilaterally, normal work of breathing Heart- Regular rate and rhythm, no murmurs, rubs or gallops, PMI not laterally displaced GI- soft, NT, ND, + BS Extremities- no clubbing, cyanosis, or edema Skin- no rash or lesion Psych- agitated, delirious Neuro- strength and sensation are intact  LABS: Basic Metabolic Panel:  Basename 11/07/12 0624  11/06/12 0455  NA 140 140  K 3.5 3.4*  CL 101 99  CO2 27 32  GLUCOSE 100* 107*  BUN 16 22  CREATININE 0.62 0.82  CALCIUM 9.2 9.0  MG 2.3 2.3  PHOS 3.2 2.8   Liver Function Tests: No results found for this basename: AST:2,ALT:2,ALKPHOS:2,BILITOT:2,PROT:2,ALBUMIN:2 in the last 72 hours No results found for this basename: LIPASE:2,AMYLASE:2 in the last 72 hours CBC:  Basename 11/07/12 0624 11/06/12 0455  WBC 15.6* 20.2*  NEUTROABS -- --  HGB 11.5* 11.2*  HCT 36.2 34.4*  MCV 93.8 93.2  PLT 427* 410*  ASSESSMENT AND PLAN:  Active Problems:  Hypertension  GERD (gastroesophageal reflux disease)  Acute respiratory failure  Cardiogenic shock  Cardiac arrest  ST elevation myocardial infarction (STEMI) of inferior wall  H1N1 influenza with pneumonia  Acute blood loss anemia  Hypokalemia  1. sp MI with PCI complicated by cardiogenic shock requiring IABP  Clinically improving,  Will add low dose lisinopril and titrate as BP allows Continue ASA, effient, statin, metoprolol  2. VF arrest due to ischemic event, now quiescent on amiodarone s/p PCI Stop amiodarone and monitor Titrate beta blocker as BP allows Avoid QT prolonging medicines  3. ICU encephalopathy-- improving   4. Mild volume  overload- euvolemic presently  5. Hypokalemia- replete  6. Anemia- follow  7. Respiratory  H1N1 positive with LLL PNA. As per PCCM  Transfer to telemetry today Cardiac rehab consult   Hillis Range, MD 11/07/2012 8:06 AM

## 2012-11-07 NOTE — Progress Notes (Signed)
Continuous pulse ox applied to forehead, removed pulse ox connected to telemetry monitor 94% on 2 L, hr69

## 2012-11-07 NOTE — Progress Notes (Signed)
PULMONARY  / CRITICAL CARE MEDICINE  Name: Kippy Melena MRN: [id number] DOB: September 17, 1946    LOS: 9  REFERRING MD :  Clifton James  CHIEF COMPLAINT:  Respiratory failure   BRIEF PATIENT DESCRIPTION:  66 year old female smoker admitted on 12/20 after VF arrest in setting of  STEMI. Had < 1 minute of CPR. Was oriented on arrival to ER. Went to cath lab where was found to have occluded RCA. Developed shock and hypoperfusion during case. Intubated. DES placed in RCA. IABP placed, PCCM asked to see.   LINES / TUBES: oett 12/20>>> Right fem venous sheath 12/20>>> Right fem IABP 12/20>>>out Left art sheath 12/20>>>out  CULTURES: Sputum 12/20>>>oral flora Influenza PCR 12/20>>>Pos influenza A, H1N1 Urine strep 12/20>>>negative Urine legionella 12/20>>>negative  ANTIBIOTICS: tamiflu 12/20(H1N1 pos)>>>12/29 unasyn 12/21 (CAP)>>12/28 Augmentin 12/28>>>1/2  SIGNIFICANT EVENTS:  Cardiac cath 12/20: Occluded proximal RCA now s/p PTCA/DES x 1   LEVEL OF CARE:  ICU  PRIMARY SERVICE:  PCCM CONSULTANTS:  PCCM  CODE STATUS: full  DIET: start TF 12/22 DVT Px:  integrilin  GI Px:  PPI   VITAL SIGNS: Temp:  [98.3 F (36.8 C)-100 F (37.8 C)] 98.3 F (36.8 C) (12/29 0353) Pulse Rate:  [68-95] 75  (12/29 0700) Resp:  [18-27] 20  (12/29 0700) BP: (108-159)/(63-95) 142/66 mmHg (12/29 0700) SpO2:  [90 %-100 %] 94 % (12/29 0815) FiO2 (%):  [40 %] 40 % (12/28 1405) Weight:  [75.6 kg (166 lb 10.7 oz)] 75.6 kg (166 lb 10.7 oz) (12/29 0500) HEMODYNAMICS: CVP:  [4 mmHg] 4 mmHg VENTILATOR SETTINGS: Vent Mode:  [-]  FiO2 (%):  [40 %] 40 %  INTAKE / OUTPUT: Intake/Output      12/28 0701 - 12/29 0700 12/29 0701 - 12/30 0700   P.O. 920    I.V. (mL/kg) 180.1 (2.4)    IV Piggyback 50    Total Intake(mL/kg) 1150.1 (15.2)    Urine (mL/kg/hr) 2000 (1.1)    Total Output 2000    Net -850          PHYSICAL EXAMINATION: General:  Confused, paranoid. Neuro:  Moves all extremities, no following  commands HEENT: Alpha/AT, PERRL, EOM-I and MMM. Cardiovascular:  s1s2 regular, no murmur Lungs:  Scattered rhonchi, no wheeze Abdomen:  Soft, non tender Musculoskeletal:  No edema, foot drop boots in place Skin:  No rashes  LABS: Cbc  Lab 11/07/12 0624 11/06/12 0455 11/05/12 0500  WBC 15.6* -- --  HGB 11.5* 11.2* 11.3*  HCT 36.2 34.4* 34.6*  PLT 427* 410* 403*   Chemistry  Lab 11/07/12 0624 11/06/12 0455 11/05/12 0500  NA 140 140 139  K 3.5 3.4* 3.4*  CL 101 99 96  CO2 27 32 31  BUN 16 22 21   CREATININE 0.62 0.82 0.57  CALCIUM 9.2 9.0 9.4  MG 2.3 2.3 2.3  PHOS 3.2 2.8 2.7  GLUCOSE 100* 107* 103*   Liver fxn  Lab 11/02/12 0415  AST 38*  ALT 33  ALKPHOS 73  BILITOT 0.2*  PROT 5.3*  ALBUMIN 2.2*   coags No results found for this basename: APTT:3,INR:3 in the last 168 hours Sepsis markers No results found for this basename: LATICACIDVEN:3,PROCALCITON:3 in the last 168 hours Cardiac markers No results found for this basename: CKTOTAL:3,CKMB:3,TROPONINI:3 in the last 168 hours BNP No results found for this basename: PROBNP:3 in the last 168 hours ABG  Lab 11/03/12 0600 11/02/12 0432  PHART 7.422 7.354  PCO2ART 44.9 43.6  PO2ART 64.6* 73.0*  HCO3  28.7* 23.7  TCO2 30.1 25.0   CBG trend  Lab 11/05/12 0828 11/05/12 0530 11/05/12 0003 11/04/12 2031 11/04/12 1555  GLUCAP 105* 106* 123* 128* 130*    Intake/Output Summary (Last 24 hours) at 11/07/12 0835 Last data filed at 11/07/12 0600  Gross per 24 hour  Intake    770 ml  Output   2000 ml  Net  -1230 ml   IMAGING:  Dg Chest Port 1 View  11/07/2012  *RADIOLOGY REPORT*  Clinical Data: Follow up pneumonia  PORTABLE CHEST - 1 VIEW  Comparison: 11/05/2012  Findings: Normal heart size.  Small left pleural effusion and left foot base atelectasis is identified.  This appears similar to previous exam.  No new findings.  IMPRESSION:  1.  Persistent left pleural effusion and left base atelectasis.   Original Report  Authenticated By: Signa Kell, M.D.    DIAGNOSES: Active Problems:  Hypertension  GERD (gastroesophageal reflux disease)  Acute respiratory failure  Cardiogenic shock  Cardiac arrest  ST elevation myocardial infarction (STEMI) of inferior wall  H1N1 influenza with pneumonia  Acute blood loss anemia  Hypokalemia  ASSESSMENT / PLAN:  PULMONARY  ASSESSMENT: Acute respiratory failure Mostly in the setting of shock and hypoperfusion.  H1N1 and prob PNA LLL PLAN:   - Extubated with much improved respiratory status but remains very confused. - F/u CXR. - D/C steroids. - Continue BD's with xopenex due to tachyarrhythmias and will need to go home on PRN xopenex with F/U with pulmonary as outpatient. - Smoking cessation.  CARDIOVASCULAR  ASSESSMENT:  Acute anterior STEMI  S/p cardiac cath with occluded RCA. s/p PTCA/DES X1 on 12/20 Cardiogenic shock Resolved. CHB Runs of Vtach now stable  PLAN:  - Hold for further diureses for today patient is auto-diuresing. - Amiodarone per cardiology. - Continue lipitor, asa, effient. - Defer timing of adding beta blocker/ACE to cardiology.  RENAL  ASSESSMENT:  Metabolic acidosis (positive anion gap c/w lactic acidosis). Resolved.  PLAN:   - Monitor urine outpt, renal fx, electrolytes. - Replace K.  GASTROINTESTINAL  ASSESSMENT:   Nutrition. Stool occult blood positive with hx of PUD. PLAN:   - Continue BID protonix. - Heart healthy diet.  HEMATOLOGIC  ASSESSMENT:   Anemia. PLAN:  - F/u CBC. - Transfuse for Hb < 8 in setting of ACS.  INFECTIOUS  ASSESSMENT:   H1N1 positive with LLL PNA.  PLAN:   Completed a 10 day course of tamiflu. Continue augmentin with last dose on 1/2.  ENDOCRINE  ASSESSMENT:   Hyperglycemia  PLAN:   - SSI  NEUROLOGIC  ASSESSMENT:  Acute encephalopathy 2nd to cardiac arrest, shock, respiratory failure. CT head 12/24 re-assuring. Remains very confused and paranoid with no  psychiatric history.  Interestingly she is fully alert and oriented.  PLAN:   - Transfer to tele.   - Continue zyprexa but may d/c once delirium is resolved. - If remains paranoid will likely need psych to see on Monday given severe paranoia.  PCCM will sign off, please call back if needed.  Alyson Reedy, M.D. [facility] Pulmonary/Critical Care Medicine. Pager: [phone]. After hours pager: [phone].

## 2012-11-07 NOTE — Progress Notes (Signed)
eLink Physician-Brief Progress Note Patient Name: Samantha King DOB: [birth year] MRN: [id number]  Date of Service  11/07/2012   HPI/Events of Note   Patient describing some chest discomfort, leg, difficult in setting confusion to RN, no hemodynamic changes or resp changes ecg at 255 am reviewed, NO ST changes, Q inferior from St elevation seen on 12/21  eICU Interventions  I reported ECg to RN. She plans on treating with tylenol and re assess if vague discomfort reoccurs with cardiology   Intervention Category Minor Interventions: Clinical assessment - ordering diagnostic tests  Samantha King J. 11/07/2012, 3:17 AM

## 2012-11-08 ENCOUNTER — Encounter (HOSPITAL_COMMUNITY): Payer: Self-pay | Admitting: General Practice

## 2012-11-08 DIAGNOSIS — R570 Cardiogenic shock: Secondary | ICD-10-CM | POA: Diagnosis not present

## 2012-11-08 DIAGNOSIS — D62 Acute posthemorrhagic anemia: Secondary | ICD-10-CM | POA: Diagnosis not present

## 2012-11-08 DIAGNOSIS — J9600 Acute respiratory failure, unspecified whether with hypoxia or hypercapnia: Secondary | ICD-10-CM | POA: Diagnosis not present

## 2012-11-08 DIAGNOSIS — I2119 ST elevation (STEMI) myocardial infarction involving other coronary artery of inferior wall: Secondary | ICD-10-CM | POA: Diagnosis not present

## 2012-11-08 LAB — CBC
HCT: 35.8 % — ABNORMAL LOW (ref 36.0–46.0)
Hemoglobin: 11.6 g/dL — ABNORMAL LOW (ref 12.0–15.0)
MCH: 30.3 pg (ref 26.0–34.0)
MCHC: 32.4 g/dL (ref 30.0–36.0)

## 2012-11-08 LAB — BASIC METABOLIC PANEL
BUN: 19 mg/dL (ref 6–23)
Calcium: 9.3 mg/dL (ref 8.4–10.5)
GFR calc non Af Amer: 90 mL/min — ABNORMAL LOW (ref >90)
Glucose, Bld: 118 mg/dL — ABNORMAL HIGH (ref 70–99)
Potassium: 3.5 mEq/L (ref 3.5–5.1)

## 2012-11-08 MED ORDER — POTASSIUM CHLORIDE CRYS ER 20 MEQ PO TBCR
40.0000 meq | EXTENDED_RELEASE_TABLET | Freq: Once | ORAL | Status: AC
  Administered 2012-11-08: 40 meq via ORAL
  Filled 2012-11-08: qty 2

## 2012-11-08 MED ORDER — HYDROCODONE-ACETAMINOPHEN 5-325 MG PO TABS
1.0000 | ORAL_TABLET | Freq: Four times a day (QID) | ORAL | Status: DC | PRN
  Administered 2012-11-08: 1 via ORAL
  Filled 2012-11-08: qty 1

## 2012-11-08 NOTE — Progress Notes (Signed)
Rehab Admissions Coordinator Note:  Patient was screened by Trish Mage for appropriateness for an Inpatient Acute Rehab Consult.  At this time, we are recommending Inpatient Rehab consult.  Noted PT recommending CIR consult.  I have called case manager to request MD order for rehab consult.  Trish Mage 11/08/2012, 11:53 AM  I can be reached at [phone].

## 2012-11-08 NOTE — Progress Notes (Signed)
When I arrived pt was sitting bedside. She thanked me for being there for her family when she came to ED. She told me what she had been through and was thankful for all the prayers and support. Pt was appreciative for visit.  Marjory Lies Chaplain

## 2012-11-08 NOTE — Progress Notes (Signed)
    SUBJECTIVE: No chest pain. Notes that breathing seems to be better. No events.   BP 132/69  Pulse 89  Temp 99.6 F (37.6 C) (Oral)  Resp 20  Ht 5\' 2"  (1.575 m)  Wt 168 lb 4.8 oz (76.34 kg)  BMI 30.78 kg/m2  SpO2 95%  Intake/Output Summary (Last 24 hours) at 11/08/12 1146 Last data filed at 11/08/12 0900  Gross per 24 hour  Intake    120 ml  Output      0 ml  Net    120 ml    PHYSICAL EXAM General: Well developed, well nourished, in no acute distress. Alert and oriented x 3.  Psych:  Good affect, responds appropriately Neck: No JVD. No masses noted.  Lungs: Scattered wheezes bilaterally.   Heart: RRR with no murmurs noted. Abdomen: Bowel sounds are present. Soft, non-tender.  Extremities: No lower extremity edema. Bilateral groin cath sites ok without hematoma.   LABS: Basic Metabolic Panel:  Basename 11/08/12 0542 11/07/12 0624  NA 138 140  K 3.5 3.5  CL 102 101  CO2 25 27  GLUCOSE 118* 100*  BUN 19 16  CREATININE 0.64 0.62  CALCIUM 9.3 9.2  MG 2.1 2.3  PHOS 2.7 3.2   CBC:  Basename 11/08/12 0542 11/07/12 0624  WBC 20.6* 15.6*  NEUTROABS -- --  HGB 11.6* 11.5*  HCT 35.8* 36.2  MCV 93.5 93.8  PLT 443* 427*   Current Meds:    . amoxicillin-clavulanate  1 tablet Oral BID  . antiseptic oral rinse  15 mL Mouth Rinse QID  . aspirin  81 mg Oral Daily  . atorvastatin  80 mg Per NG tube q1800  . enoxaparin (LOVENOX) injection  40 mg Subcutaneous Q24H  . levalbuterol  0.63 mg Nebulization Q6H  . lisinopril  5 mg Oral Daily  . metoprolol tartrate  12.5 mg Oral BID  . OLANZapine zydis  5 mg Oral QHS  . pantoprazole  40 mg Oral Daily  . prasugrel  10 mg Per NG tube Daily  . sodium chloride  3 mL Intravenous Q12H   Active Problems:  Hypertension  GERD (gastroesophageal reflux disease)  Acute respiratory failure  Cardiogenic shock  Cardiac arrest  ST elevation myocardial infarction (STEMI) of inferior wall  H1N1 influenza with pneumonia  Acute  blood loss anemia  Hypokalemia   ASSESSMENT AND PLAN:   1. CAD/STEMI: Pt admitted with inferior STEMI, found to have totally occluded dominant RCA. Developed cardiogenic shock during cath. DES placed x 1 RCA. Initially required IABP but quickly weaned. She is hemodynamically stable. Much improved. Continue ASA and Effient for at least one year. Continue beta blocker, statin, Ace-inh.   2.  VF arrest due to ischemic event: No recurrence since revascularization. Amiodarone has been stopped. Will titrate beta blocker as BP allows. Avoid QT prolonging medicines   3. ICU encephalopathy: Improving. Working with therapy. She is on Zyprexa. Will likely d/c before discharge.   4. Hypokalemia: Will replace today.   5. Anemia: Stable   7. Respiratory H1N1 positive with LLL PNA:  PCCM has signed off. Continue Augmentin to stop on November 11, 2012. She has completed 10 day course of Tamiflu.    Samantha King  12/30/201311:46 AM

## 2012-11-08 NOTE — Progress Notes (Signed)
CARDIAC REHAB PHASE I   PRE:  Rate/Rhythm: 82SR  BP:  Supine:   Sitting: 111/73  Standing:    SaO2: 98%2L  MODE:  Ambulation: 72 ft   POST:  Rate/Rhythem: 89SR  BP:  Supine:   Sitting: 109/73  Standing:    SaO2: 95%2L 1312-1405 Pt assisted to [facility] and helped her get cleaned up. Wanted to put on earrings and makeup. Combed hair and helped pt feel more comfortable. Walked 72 ft on 2L with rolling walker and gait belt and assisted x 2.  LOB at times needing a little steadying. Pt very motivated to walk. Sat down to rest at 36 ft. Tolerated well. To recliner with call bell. Needs to be asst x 2. Oriented and able to tell us about her family. Will see along with PT.  Samantha King

## 2012-11-08 NOTE — Progress Notes (Signed)
Patient continue to remove pulse ox probe even after explaining the need for monitor. Will continue to monitor patient and probe placement.

## 2012-11-08 NOTE — Progress Notes (Signed)
Discussed pt's improved status and agree with goal update.  11/08/2012 Veda Canning, PT Pager: [phone]

## 2012-11-08 NOTE — Progress Notes (Signed)
Dr. Clifton James stated to ok discontinue continuous pulse ox

## 2012-11-08 NOTE — Progress Notes (Signed)
Physical Therapy Treatment Patient Details Name: Samantha King MRN: [id number] DOB: [birth year] Today's Date: 11/08/2012 Time: 1478-2956 PT Time Calculation (min): 24 min  PT Assessment / Plan / Recommendation Comments on Treatment Session  Patient agreeable to increase mobility with therapy this morning. Patient was on bedside commode with nursing upon entering room. Patient more alert and able to participate this session. Discussed patient decline in mobility due to hospital course. Patient would greatly benefit from inpatient rehab to increase independence and mobility prior to discharge home where her husband will be assisting her. Patient overall limited by fatique and decreased endurance    Follow Up Recommendations  CIR;Supervision/Assistance - 24 hour     Does the patient have the potential to tolerate intense rehabilitation     Barriers to Discharge        Equipment Recommendations  Other (comment) (TBA)    Recommendations for Other Services    Frequency Min 3X/week   Plan Discharge plan remains appropriate;Frequency remains appropriate    Precautions / Restrictions Precautions Precautions: Fall   Pertinent Vitals/Pain     Mobility  Bed Mobility Bed Mobility: Not assessed Transfers Transfers: Sit to Stand;Stand to Sit Sit to Stand: 1: +2 Total assist;From chair/3-in-1;From bed;With upper extremity assist Sit to Stand: Patient Percentage: 60% Stand to Sit: With upper extremity assist;To chair/3-in-1;To bed;1: +2 Total assist Stand to Sit: Patient Percentage: 60% Details for Transfer Assistance: Patient needing cues for safe technique and hand placement from 3n1 and recliner. Patient stood x4 but limited by fatique. Patient stands with kyophotic posture needing cues to shift pelvis anteriorly and to stand upright. Patient with 2 sudden sits at  EOB.  Ambulation/Gait Ambulation/Gait Assistance: 1: +2 Total assist Ambulation/Gait: Patient Percentage: 70% Ambulation  Distance (Feet): 15 Feet Assistive device: 2 person hand held assist Ambulation/Gait Assistance Details: Patient able to ambulate across room with +2 HHA for stability and balance. Patient with decrease step length and several breaks due to fatique. Patient encouraged but unable to ambulate further. O2 sats remained above 94% on 2L with ambulation.  Gait Pattern: Shuffle;Scissoring;Step-to pattern;Narrow base of support Gait velocity: decreased    Exercises General Exercises - Lower Extremity Long Arc Quad: AROM;Both;10 reps;Seated Hip Flexion/Marching: AROM;Both;10 reps;Seated   PT Diagnosis:    PT Problem List:   PT Treatment Interventions:     PT Goals Acute Rehab PT Goals PT Goal: Sit at Edge Of Bed - Progress: Progressing toward goal PT Goal: Sit to Supine/Side - Progress: Progressing toward goal PT Goal: Sit to Stand - Progress: Progressing toward goal PT Goal: Stand to Sit - Progress: Progressing toward goal PT Transfer Goal: Bed to Chair/Chair to Bed - Progress: Progressing toward goal Pt will Ambulate: 51 - 150 feet;with min assist;with least restrictive assistive device PT Goal: Ambulate - Progress: Goal set today Additional Goals PT Goal: Additional Goal #1 - Progress: Met  Visit Information  Last PT Received On: 11/08/12 Assistance Needed: +2    Subjective Data      Cognition  Overall Cognitive Status: Appears within functional limits for tasks assessed/performed Arousal/Alertness: Awake/alert Orientation Level: Appears intact for tasks assessed Behavior During Session: [facility] for tasks performed Cognition - Other Comments: Patient alert this session. Able to recall past events and understanding hospital course. Patient aware she is unsafe to return home in the state that she is in.    Balance  Static Standing Balance Static Standing - Balance Support: Bilateral upper extremity supported Static Standing - Level of Assistance: 1: +2  Total assist Static Standing -  Comment/# of Minutes: Stood x2 min until needing to sit due to fatique  End of Session PT - End of Session Equipment Utilized During Treatment: Gait belt Activity Tolerance: Patient limited by fatigue Patient left: in chair;with call bell/phone within reach   GP     Fredrich Birks 11/08/2012, 9:04 AM  11/08/2012 Fredrich Birks PTA [phone] pager [phone] office

## 2012-11-08 NOTE — Progress Notes (Signed)
NUTRITION FOLLOW UP  Intervention:   1. Continue current diet, and encourage adequate intake.  2. Patient declines supplements at this time. She was advised to request snacks per preference.   Nutrition Dx:   Inadequate oral intake r/t inability to eat as evidenced by NPO status.  Goal:   1. Enteral nutrition to provide 60-70% of estimated calorie needs and >/= 90% of estimated protein needs, TF discontinued 2. Patient will meet 90-100% of estimated nutrition needs with oral intake.   Monitor:   PO intake, weight, labs, I/O  Assessment:   Patient extubated on 12/26. TF d/c'd. Patient reports her appetite is fair, and states that she gets distracted from eating when family visits. She declines supplements at this time as her appetite PTA was great.   Height: Ht Readings from Last 1 Encounters:  10/29/12 5\' 2"  (1.575 m)    Weight Status:   Wt Readings from Last 1 Encounters:  11/08/12 168 lb 4.8 oz (76.34 kg)    Re-estimated needs:  Kcal: 1500-1600 kcal Protein: 75-90 g Fluid: 2.3 L  Skin: Intact  Diet Order: Cardiac, 25%   Intake/Output Summary (Last 24 hours) at 11/08/12 1353 Last data filed at 11/08/12 0900  Gross per 24 hour  Intake    120 ml  Output      0 ml  Net    120 ml    Last BM: Today, loose and watery   Labs:   Lab 11/08/12 0542 11/07/12 0624 11/06/12 0455  NA 138 140 140  K 3.5 3.5 3.4*  CL 102 101 99  CO2 25 27 32  BUN 19 16 22   CREATININE 0.64 0.62 0.82  CALCIUM 9.3 9.2 9.0  MG 2.1 2.3 2.3  PHOS 2.7 3.2 2.8  GLUCOSE 118* 100* 107*    CBG (last 3)  No results found for this basename: GLUCAP:3 in the last 72 hours  Scheduled Meds:   . amoxicillin-clavulanate  1 tablet Oral BID  . antiseptic oral rinse  15 mL Mouth Rinse QID  . aspirin  81 mg Oral Daily  . atorvastatin  80 mg Per NG tube q1800  . enoxaparin (LOVENOX) injection  40 mg Subcutaneous Q24H  . levalbuterol  0.63 mg Nebulization Q6H  . lisinopril  5 mg Oral Daily  .  metoprolol tartrate  12.5 mg Oral BID  . OLANZapine zydis  5 mg Oral QHS  . pantoprazole  40 mg Oral Daily  . prasugrel  10 mg Per NG tube Daily  . sodium chloride  3 mL Intravenous Q12H    Continuous Infusions:   . sodium chloride 20 mL/hr at 11/06/12 1400    Linnell Fulling, RD, LDN Pager #: [phone] After-Hours Pager #: [phone]

## 2012-11-09 ENCOUNTER — Encounter (HOSPITAL_COMMUNITY): Payer: Self-pay | Admitting: Physician Assistant

## 2012-11-09 ENCOUNTER — Encounter (HOSPITAL_COMMUNITY): Payer: Self-pay | Admitting: *Deleted

## 2012-11-09 ENCOUNTER — Inpatient Hospital Stay (HOSPITAL_COMMUNITY): Payer: Medicare Other

## 2012-11-09 ENCOUNTER — Inpatient Hospital Stay (HOSPITAL_COMMUNITY)
Admission: RE | Admit: 2012-11-09 | Discharge: 2012-11-15 | DRG: 945 | Disposition: A | Discharge status: Home Health | Payer: Medicare Other | Source: Ambulatory Visit | Attending: Physical Medicine & Rehabilitation | Admitting: Physical Medicine & Rehabilitation

## 2012-11-09 DIAGNOSIS — G931 Anoxic brain damage, not elsewhere classified: Secondary | ICD-10-CM

## 2012-11-09 DIAGNOSIS — I2510 Atherosclerotic heart disease of native coronary artery without angina pectoris: Secondary | ICD-10-CM | POA: Diagnosis not present

## 2012-11-09 DIAGNOSIS — I252 Old myocardial infarction: Secondary | ICD-10-CM

## 2012-11-09 DIAGNOSIS — F17200 Nicotine dependence, unspecified, uncomplicated: Secondary | ICD-10-CM | POA: Diagnosis not present

## 2012-11-09 DIAGNOSIS — Z5189 Encounter for other specified aftercare: Principal | ICD-10-CM

## 2012-11-09 DIAGNOSIS — Z7982 Long term (current) use of aspirin: Secondary | ICD-10-CM

## 2012-11-09 DIAGNOSIS — I10 Essential (primary) hypertension: Secondary | ICD-10-CM | POA: Diagnosis present

## 2012-11-09 DIAGNOSIS — M6289 Other specified disorders of muscle: Secondary | ICD-10-CM | POA: Diagnosis not present

## 2012-11-09 DIAGNOSIS — Z79899 Other long term (current) drug therapy: Secondary | ICD-10-CM | POA: Diagnosis not present

## 2012-11-09 DIAGNOSIS — R29898 Other symptoms and signs involving the musculoskeletal system: Secondary | ICD-10-CM

## 2012-11-09 DIAGNOSIS — I2109 ST elevation (STEMI) myocardial infarction involving other coronary artery of anterior wall: Secondary | ICD-10-CM | POA: Diagnosis not present

## 2012-11-09 DIAGNOSIS — R5381 Other malaise: Secondary | ICD-10-CM | POA: Diagnosis present

## 2012-11-09 DIAGNOSIS — R570 Cardiogenic shock: Secondary | ICD-10-CM | POA: Diagnosis not present

## 2012-11-09 DIAGNOSIS — I2119 ST elevation (STEMI) myocardial infarction involving other coronary artery of inferior wall: Secondary | ICD-10-CM | POA: Diagnosis not present

## 2012-11-09 DIAGNOSIS — G9340 Encephalopathy, unspecified: Secondary | ICD-10-CM | POA: Diagnosis present

## 2012-11-09 DIAGNOSIS — J189 Pneumonia, unspecified organism: Secondary | ICD-10-CM | POA: Diagnosis not present

## 2012-11-09 DIAGNOSIS — F341 Dysthymic disorder: Secondary | ICD-10-CM

## 2012-11-09 DIAGNOSIS — I469 Cardiac arrest, cause unspecified: Secondary | ICD-10-CM

## 2012-11-09 DIAGNOSIS — E785 Hyperlipidemia, unspecified: Secondary | ICD-10-CM | POA: Diagnosis present

## 2012-11-09 DIAGNOSIS — R0789 Other chest pain: Secondary | ICD-10-CM | POA: Diagnosis not present

## 2012-11-09 DIAGNOSIS — F22 Delusional disorders: Secondary | ICD-10-CM

## 2012-11-09 DIAGNOSIS — D72829 Elevated white blood cell count, unspecified: Secondary | ICD-10-CM | POA: Diagnosis not present

## 2012-11-09 DIAGNOSIS — J9819 Other pulmonary collapse: Secondary | ICD-10-CM | POA: Diagnosis not present

## 2012-11-09 DIAGNOSIS — E876 Hypokalemia: Secondary | ICD-10-CM | POA: Diagnosis not present

## 2012-11-09 DIAGNOSIS — D62 Acute posthemorrhagic anemia: Secondary | ICD-10-CM | POA: Diagnosis not present

## 2012-11-09 DIAGNOSIS — J9600 Acute respiratory failure, unspecified whether with hypoxia or hypercapnia: Secondary | ICD-10-CM | POA: Diagnosis not present

## 2012-11-09 DIAGNOSIS — K219 Gastro-esophageal reflux disease without esophagitis: Secondary | ICD-10-CM | POA: Diagnosis present

## 2012-11-09 DIAGNOSIS — Z720 Tobacco use: Secondary | ICD-10-CM

## 2012-11-09 DIAGNOSIS — J90 Pleural effusion, not elsewhere classified: Secondary | ICD-10-CM | POA: Diagnosis not present

## 2012-11-09 DIAGNOSIS — I472 Ventricular tachycardia: Secondary | ICD-10-CM

## 2012-11-09 LAB — BASIC METABOLIC PANEL WITH GFR
BUN: 18 mg/dL (ref 6–23)
CO2: 24 meq/L (ref 19–32)
Calcium: 9.2 mg/dL (ref 8.4–10.5)
Chloride: 107 meq/L (ref 96–112)
Creatinine, Ser: 0.69 mg/dL (ref 0.50–1.10)
GFR calc Af Amer: >90 mL/min
GFR calc non Af Amer: 87 mL/min — ABNORMAL LOW
Glucose, Bld: 98 mg/dL (ref 70–99)
Potassium: 3.7 meq/L (ref 3.5–5.1)
Sodium: 140 meq/L (ref 135–145)

## 2012-11-09 LAB — CBC
HCT: 34.8 % — ABNORMAL LOW (ref 36.0–46.0)
Hemoglobin: 11.5 g/dL — ABNORMAL LOW (ref 12.0–15.0)
MCHC: 33 g/dL (ref 30.0–36.0)
MCV: 93.5 fL (ref 78.0–100.0)
RDW: 14.9 % (ref 11.5–15.5)

## 2012-11-09 LAB — CREATININE, SERUM: GFR calc Af Amer: >90 mL/min (ref >90)

## 2012-11-09 LAB — GLUCOSE, CAPILLARY

## 2012-11-09 IMAGING — CR DG CHEST 2V
2 series · 2 of 2 positions shown · non-contrast
Comparison: [DATE]

CLINICAL DATA: Evaluate pneumonia.  Status post cardiac arrest.

CHEST - 2 VIEW

[w chest lat]
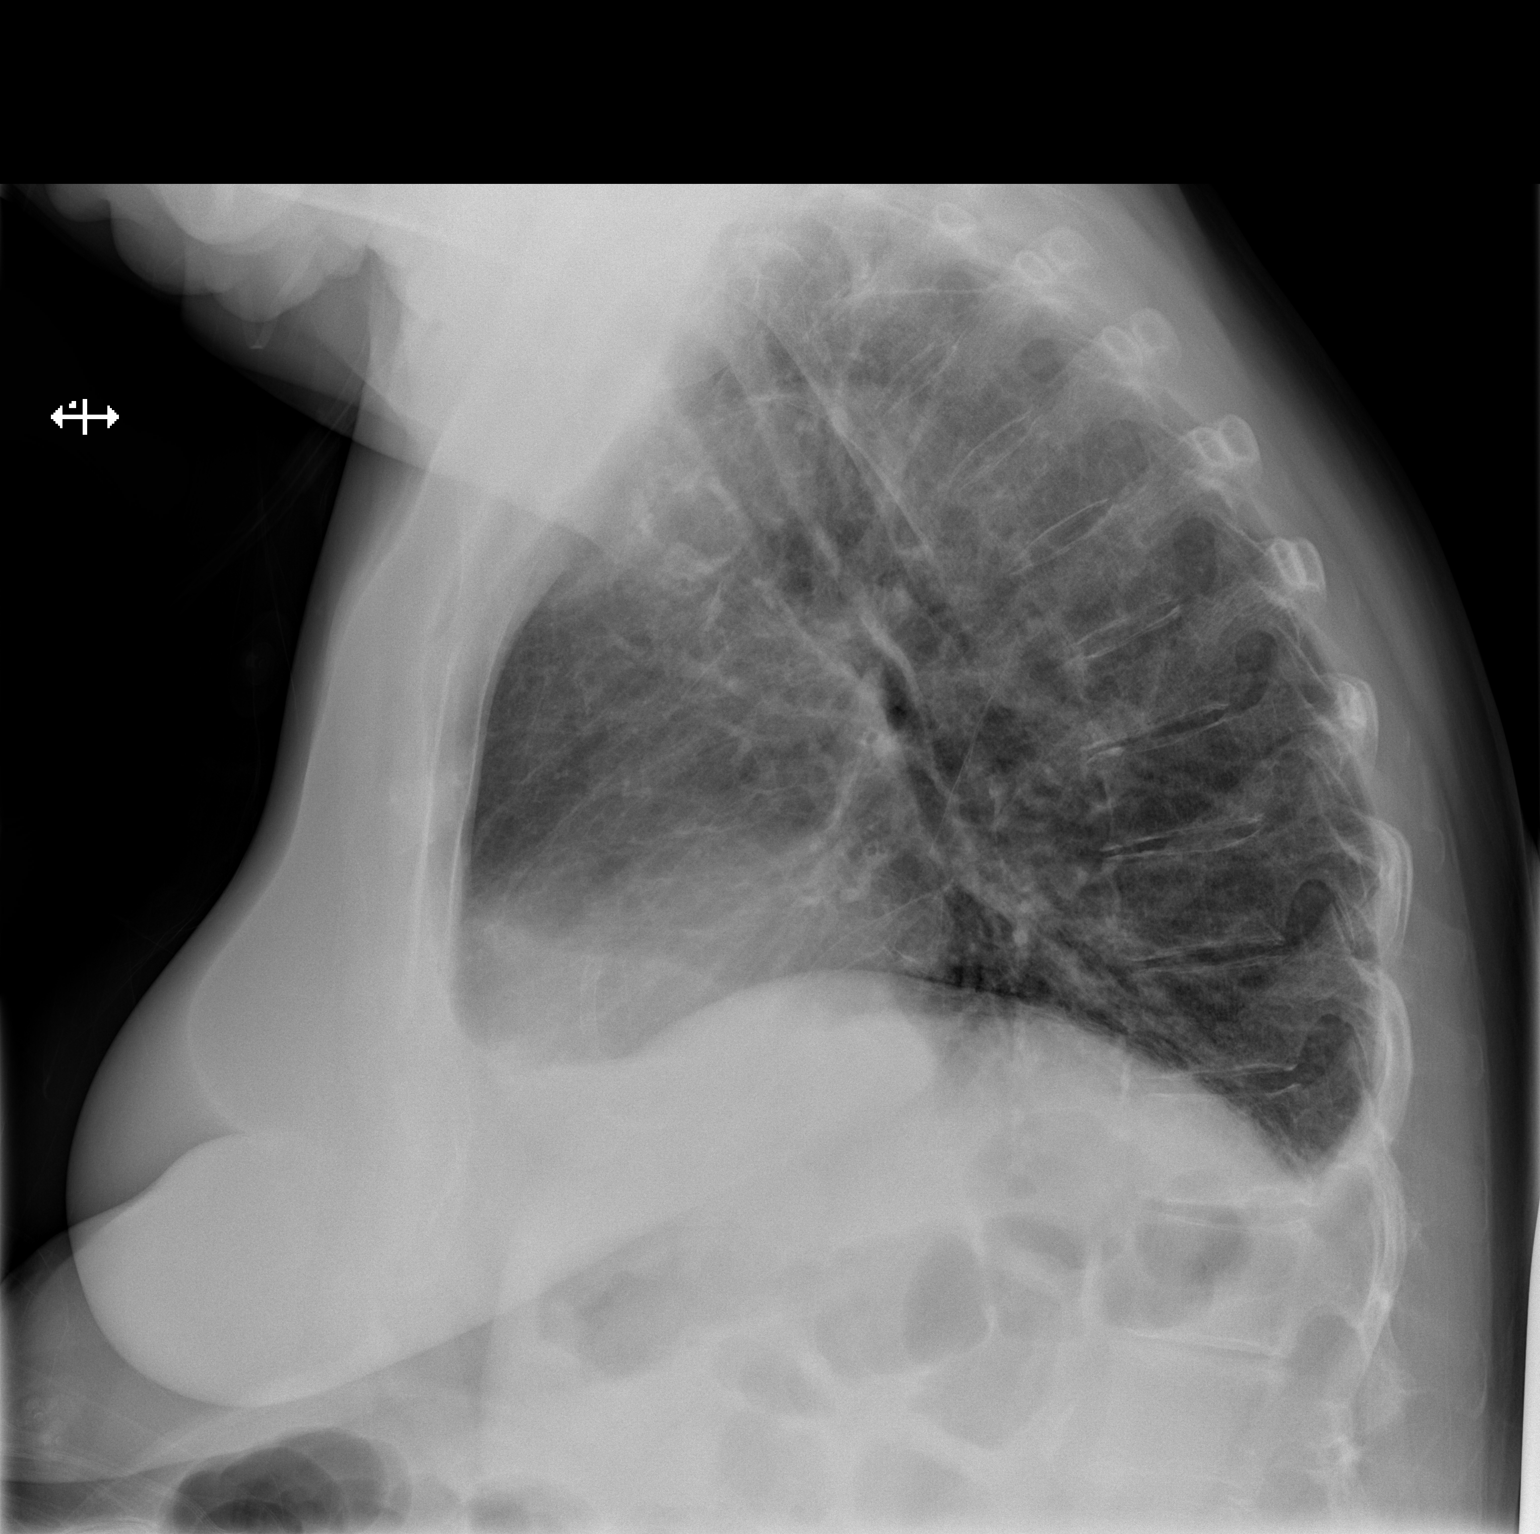

[x chest ap]
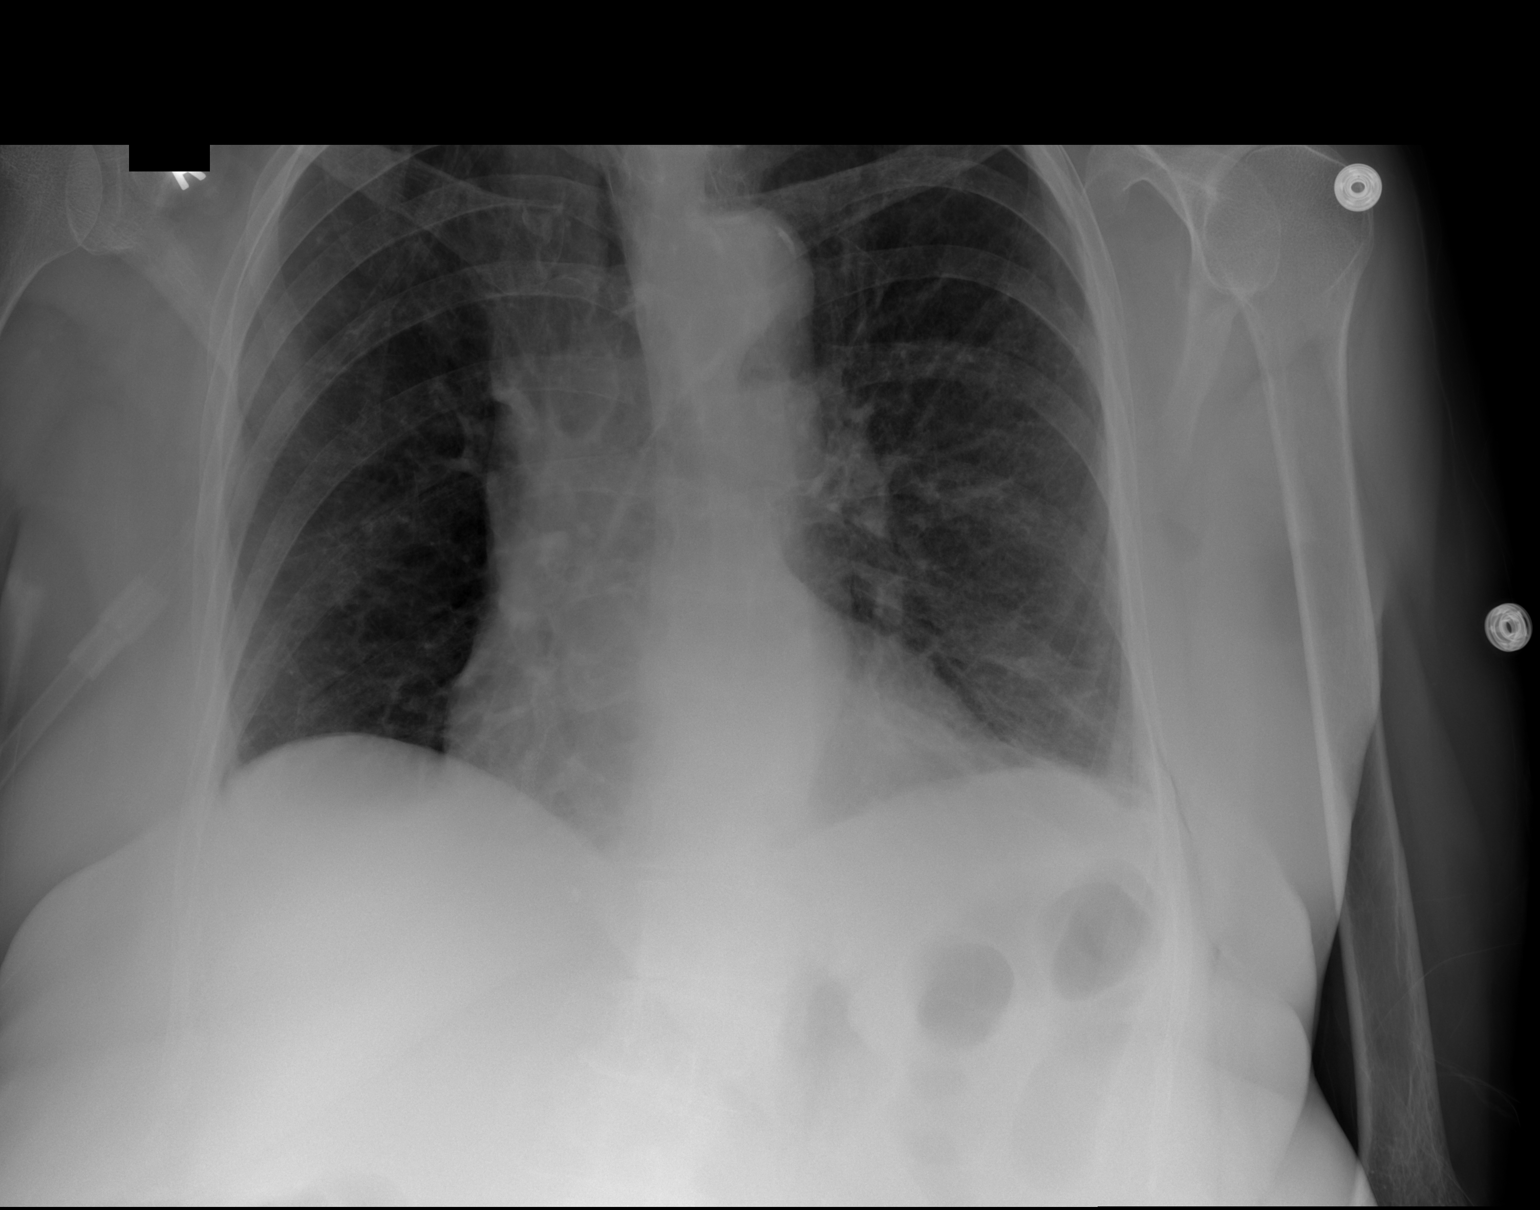

[2 of 2 positions shown; findings below may reference images not displayed]

FINDINGS: Normal heart size.  There is a small left effusion,
similar to previous exam.  The atelectasis is noted in the left
base.  No new findings identified.  No airspace consolidation
identified.
IMPRESSION: 1.  Persistent left effusion with overlying plate-like atelectasis.

## 2012-11-09 MED ORDER — PANTOPRAZOLE SODIUM 40 MG PO TBEC
40.0000 mg | DELAYED_RELEASE_TABLET | Freq: Every day | ORAL | Status: DC
  Administered 2012-11-10 – 2012-11-15 (×6): 40 mg via ORAL
  Filled 2012-11-09 (×9): qty 1

## 2012-11-09 MED ORDER — NITROGLYCERIN 0.4 MG SL SUBL: 0.4000 mg | SUBLINGUAL_TABLET | SUBLINGUAL | Status: DC | PRN

## 2012-11-09 MED ORDER — PRASUGREL HCL 10 MG PO TABS
10.0000 mg | ORAL_TABLET | Freq: Every day | ORAL | Status: DC
  Administered 2012-11-10 – 2012-11-15 (×6): 10 mg via ORAL
  Filled 2012-11-09 (×8): qty 1

## 2012-11-09 MED ORDER — PROCHLORPERAZINE MALEATE 5 MG PO TABS: 5.0000 mg | ORAL_TABLET | Freq: Four times a day (QID) | ORAL | Status: DC | PRN

## 2012-11-09 MED ORDER — GUAIFENESIN-DM 100-10 MG/5ML PO SYRP: 5.0000 mL | ORAL_SOLUTION | Freq: Four times a day (QID) | ORAL | Status: DC | PRN

## 2012-11-09 MED ORDER — TRAMADOL HCL 50 MG PO TABS
50.0000 mg | ORAL_TABLET | Freq: Four times a day (QID) | ORAL | Status: DC | PRN
  Administered 2012-11-13 – 2012-11-15 (×4): 50 mg via ORAL
  Filled 2012-11-09 (×5): qty 1

## 2012-11-09 MED ORDER — METOPROLOL TARTRATE 25 MG PO TABS
25.0000 mg | ORAL_TABLET | Freq: Two times a day (BID) | ORAL | Status: DC
  Administered 2012-11-09 – 2012-11-15 (×11): 25 mg via ORAL
  Filled 2012-11-09 (×15): qty 1

## 2012-11-09 MED ORDER — LEVALBUTEROL HCL 0.63 MG/3ML IN NEBU
0.6300 mg | INHALATION_SOLUTION | Freq: Four times a day (QID) | RESPIRATORY_TRACT | Status: DC
  Administered 2012-11-09 – 2012-11-11 (×8): 0.63 mg via RESPIRATORY_TRACT
  Filled 2012-11-09 (×15): qty 3

## 2012-11-09 MED ORDER — TRAZODONE HCL 50 MG PO TABS: 25.0000 mg | ORAL_TABLET | Freq: Every evening | ORAL | Status: DC | PRN

## 2012-11-09 MED ORDER — ALUM & MAG HYDROXIDE-SIMETH 200-200-20 MG/5ML PO SUSP: 30.0000 mL | ORAL | Status: DC | PRN

## 2012-11-09 MED ORDER — PROCHLORPERAZINE EDISYLATE 5 MG/ML IJ SOLN: 5.0000 mg | Freq: Four times a day (QID) | INTRAMUSCULAR | Status: DC | PRN

## 2012-11-09 MED ORDER — METOPROLOL TARTRATE 25 MG PO TABS
25.0000 mg | ORAL_TABLET | Freq: Two times a day (BID) | ORAL | Status: DC
  Filled 2012-11-09: qty 1

## 2012-11-09 MED ORDER — PROCHLORPERAZINE 25 MG RE SUPP: 12.5000 mg | Freq: Four times a day (QID) | RECTAL | Status: DC | PRN

## 2012-11-09 MED ORDER — TRAZODONE 25 MG HALF TABLET
25.0000 mg | ORAL_TABLET | Freq: Every day | ORAL | Status: DC
  Administered 2012-11-09: 25 mg via ORAL
  Filled 2012-11-09 (×4): qty 2

## 2012-11-09 MED ORDER — NYSTATIN 100000 UNIT/ML MT SUSP
5.0000 mL | Freq: Four times a day (QID) | OROMUCOSAL | Status: DC
  Administered 2012-11-09 – 2012-11-14 (×21): 500000 [IU] via ORAL
  Filled 2012-11-09 (×28): qty 5

## 2012-11-09 MED ORDER — DIPHENHYDRAMINE HCL 12.5 MG/5ML PO ELIX
12.5000 mg | ORAL_SOLUTION | Freq: Four times a day (QID) | ORAL | Status: DC | PRN
  Filled 2012-11-09 (×2): qty 10

## 2012-11-09 MED ORDER — POTASSIUM CHLORIDE CRYS ER 20 MEQ PO TBCR
40.0000 meq | EXTENDED_RELEASE_TABLET | Freq: Once | ORAL | Status: AC
  Administered 2012-11-09: 40 meq via ORAL
  Filled 2012-11-09: qty 2

## 2012-11-09 MED ORDER — ATORVASTATIN CALCIUM 80 MG PO TABS
80.0000 mg | ORAL_TABLET | Freq: Every day | ORAL | Status: DC
  Administered 2012-11-09 – 2012-11-14 (×6): 80 mg via NASOGASTRIC
  Filled 2012-11-09 (×7): qty 1

## 2012-11-09 MED ORDER — ENOXAPARIN SODIUM 40 MG/0.4ML ~~LOC~~ SOLN
40.0000 mg | SUBCUTANEOUS | Status: DC
  Administered 2012-11-09 – 2012-11-14 (×6): 40 mg via SUBCUTANEOUS
  Filled 2012-11-09 (×7): qty 0.4

## 2012-11-09 MED ORDER — ASPIRIN 81 MG PO CHEW
81.0000 mg | CHEWABLE_TABLET | Freq: Every day | ORAL | Status: DC
  Administered 2012-11-10 – 2012-11-15 (×6): 81 mg via ORAL
  Filled 2012-11-09 (×8): qty 1

## 2012-11-09 MED ORDER — LOPERAMIDE HCL 2 MG PO CAPS: 2.0000 mg | ORAL_CAPSULE | ORAL | Status: DC | PRN

## 2012-11-09 MED ORDER — AMOXICILLIN-POT CLAVULANATE 875-125 MG PO TABS
1.0000 | ORAL_TABLET | Freq: Two times a day (BID) | ORAL | Status: DC
  Administered 2012-11-09 – 2012-11-10 (×3): 1 via ORAL
  Filled 2012-11-09 (×6): qty 1

## 2012-11-09 MED ORDER — LISINOPRIL 5 MG PO TABS
5.0000 mg | ORAL_TABLET | Freq: Every day | ORAL | Status: DC
  Administered 2012-11-11 – 2012-11-15 (×4): 5 mg via ORAL
  Filled 2012-11-09 (×8): qty 1

## 2012-11-09 MED ORDER — ACETAMINOPHEN 325 MG PO TABS
325.0000 mg | ORAL_TABLET | ORAL | Status: DC | PRN
  Administered 2012-11-09 – 2012-11-10 (×5): 650 mg via ORAL
  Administered 2012-11-11: 325 mg via ORAL
  Administered 2012-11-11 – 2012-11-15 (×11): 650 mg via ORAL
  Filled 2012-11-09 (×17): qty 2

## 2012-11-09 MED ORDER — BIOTENE DRY MOUTH MT LIQD
15.0000 mL | Freq: Four times a day (QID) | OROMUCOSAL | Status: DC
  Administered 2012-11-10 – 2012-11-14 (×10): 15 mL via OROMUCOSAL

## 2012-11-09 NOTE — Discharge Summary (Signed)
See full note this am. cdm 

## 2012-11-09 NOTE — Care Management Note (Signed)
    Page 1 of 1   11/09/2012     2:24:46 PM   CARE MANAGEMENT NOTE 11/09/2012  Patient:  Samantha King, Samantha King   Account Number:  [id number]  Date Initiated:  10/29/2012  Documentation initiated by:  Junius Creamer  Subjective/Objective Assessment:   adm w mi     Action/Plan:   lives w husband, pcp dr Neale Burly   Anticipated DC Date:  11/09/2012   Anticipated DC Plan:  IP REHAB FACILITY      DC Planning Services  CM consult      Choice offered to / List presented to:             Status of service:  Completed, signed off Medicare Important Message given?   (If response is "NO", the following Medicare IM given date fields will be blank) Date Medicare IM given:   Date Additional Medicare IM given:    Discharge Disposition:  IP REHAB FACILITY  Per UR Regulation:  Reviewed for med. necessity/level of care/duration of stay  If discussed at Long Length of Stay Meetings, dates discussed:   11/09/2012    Comments:  11/09/12- 1400- Donn Pierini RN, BSN [phone] Pt for d/c to CIR today, is on Effient and benefits check done- pt will not need prior auth when discharged home and copay should be around $30.   12/20 1436 debbie dowell rn,bsn [phone]

## 2012-11-09 NOTE — Progress Notes (Signed)
    SUBJECTIVE: No SOB or chest pain.   BP 127/77  Pulse 96  Temp 99.8 F (37.7 C) (Oral)  Resp 18  Ht 5\' 2"  (1.575 m)  Wt 167 lb 8.8 oz (76 kg)  BMI 30.65 kg/m2  SpO2 100%  Intake/Output Summary (Last 24 hours) at 11/09/12 1035 Last data filed at 11/09/12 1032  Gross per 24 hour  Intake    363 ml  Output      0 ml  Net    363 ml    PHYSICAL EXAM General: Well developed, well nourished, in no acute distress. Alert and oriented x 3.  Psych:  Good affect, responds appropriately Neck: No JVD. No masses noted.  Lungs: Clear bilaterally with no wheezes or rhonci noted.  Heart: RRR with no murmurs noted. Abdomen: Bowel sounds are present. Soft, non-tender.  Extremities: No lower extremity edema.   LABS: Basic Metabolic Panel:  Basename 11/09/12 0504 11/08/12 0542 11/07/12 0624  NA 140 138 --  K 3.7 3.5 --  CL 107 102 --  CO2 24 25 --  GLUCOSE 98 118* --  BUN 18 19 --  CREATININE 0.69 0.64 --  CALCIUM 9.2 9.3 --  MG -- 2.1 2.3  PHOS -- 2.7 3.2   CBC:  Basename 11/08/12 0542 11/07/12 0624  WBC 20.6* 15.6*  NEUTROABS -- --  HGB 11.6* 11.5*  HCT 35.8* 36.2  MCV 93.5 93.8  PLT 443* 427*   Current Meds:    . amoxicillin-clavulanate  1 tablet Oral BID  . antiseptic oral rinse  15 mL Mouth Rinse QID  . aspirin  81 mg Oral Daily  . atorvastatin  80 mg Per NG tube q1800  . enoxaparin (LOVENOX) injection  40 mg Subcutaneous Q24H  . levalbuterol  0.63 mg Nebulization Q6H  . lisinopril  5 mg Oral Daily  . metoprolol tartrate  12.5 mg Oral BID  . OLANZapine zydis  5 mg Oral QHS  . pantoprazole  40 mg Oral Daily  . prasugrel  10 mg Per NG tube Daily  . sodium chloride  3 mL Intravenous Q12H     ASSESSMENT AND PLAN:  1. CAD/STEMI: Pt admitted with inferior STEMI, found to have totally occluded dominant RCA. Developed cardiogenic shock during cath. DES placed x 1 RCA. Initially required IABP but quickly weaned. She is hemodynamically stable and slowly recoving.  Continue ASA and Effient for at least one year. Continue beta blocker, statin, Ace-inh.   2. VF arrest due to ischemic event: No recurrence since revascularization. Amiodarone has been stopped. Will titrate beta blocker as BP allows. Avoid QT prolonging medicines   3. ICU encephalopathy: Improving and near baseline. Working with therapy. Will d/c Zyprexa today.   4. Hypokalemia: Will replace today.   5. Anemia: Stable   6. Severe deconditioning: Appreciate Inpatient Rehab team consultation. I think she would benefit from stay in our inpatient rehab unit. Pending insurance. OK to move if approved.   7. Respiratory H1N1 positive with LLL PNA: PCCM has signed off. Continue Augmentin to stop on November 11, 2012. She has completed 10 day course of Tamiflu.     MCALHANY,CHRISTOPHER  12/31/201310:35 AM

## 2012-11-09 NOTE — PMR Pre-admission (Signed)
PMR Admission Coordinator Pre-Admission Assessment  Patient: Samantha King is an 66 y.o., female MRN: [id number] DOB: [birth year] Height: 5\' 2"  (157.5 cm) Weight: 76 kg (167 lb 8.8 oz)              Insurance Information HMO:     PPO:      PCP:      IPA:      80/20: yes     OTHER: no HMO PRIMARY: Medicare a and b      Policy#: [id number]      Subscriber: pt CM Name:       Phone#:      Fax#:  Pre-Cert#:       Employer:  Benefits:  Phone #: [employer]     Name: 12/31 Eff. Date: 11/10/08     Deduct: $1184      Out of Pocket Max: none      Life Max: none CIR: 100%      SNF: 20 full days LBD none Outpatient: 80%     Co-Pay: 20% Home Health: 100%      Co-Pay: none DME: 80%     Co-Pay: 20% Providers: pt choice  SECONDARY: [employer] [place] supplement      Policy#: [id number]      Subscriber: pt  Medicaid Application Date:       Case Manager:  Disability Application Date:       Case Worker:   Emergency Contact Information Contact Information    Name Relation Home Work Mobile   Whittenberg,Harold Spouse [phone]     Charlet, Harr [phone]       Current Medical History  Patient Admitting Diagnosis: Deconditioning, encephalopathy after cardiogenic shock  History of Present Illness: Samantha King is a 66 y.o. female smoker with history of HTN, admitted on 12/20 after VF arrest in setting of STEMI. Had < 1 minute of CPR. Was oriented on arrival to ER. Went to cath lab where was found to have occluded RCA. Developed shock and hypoperfusion during case. Intubated. DES placed in RCA. IABP placed. Influenza PCR positive for H1N1 and CXR with LLL CAP. Treated with tamiflu and unasyn-->changed to Augmentin thru 1/2. VT treated with amiodarone. Fluid overload treated with diuretics and patient extubated on 12/26. She developed heme positive stools treated with BID protonix. She has had confusion with hallucinations treated with Zyprexa. CT head 12/24 without acute changes. PT evaluation  done 12/27 and patient noted to be paranoid had difficulty following commands. Mentation slowly improving but she continues with balance and gait deficits. PT, CM, MD recommending CIR.  Past Medical History  Past Medical History  Diagnosis Date  . Hypertension   . MI (myocardial infarction)   . GERD (gastroesophageal reflux disease)   . Complication of anesthesia   . PONV (postoperative nausea and vomiting)   . Pneumonia 2013    Family History  family history includes Heart attack in her brother.  Prior Rehab/Hospitalizations:none   Current Medications  Current facility-administered medications:0.9 %  sodium chloride infusion, 250 mL, Intravenous, PRN, Gery Pray, PA-C, Last Rate: 999 mL/hr at 10/30/12 1217, 250 mL at 10/30/12 1217;  0.9 %  sodium chloride infusion, , Intravenous, Continuous, Dolores Patty, MD, Last Rate: 20 mL/hr at 11/06/12 1400;  acetaminophen (TYLENOL) tablet 650 mg, 650 mg, Per Tube, Q6H PRN, Storm Frisk, MD, 650 mg at 11/09/12 1032 amoxicillin-clavulanate (AUGMENTIN) 875-125 MG per tablet 1 tablet, 1 tablet, Oral, BID, Alyson Reedy, MD, 1 tablet at 11/09/12  1032;  antiseptic oral rinse (BIOTENE) solution 15 mL, 15 mL, Mouth Rinse, QID, Simonne Martinet, NP, 15 mL at 11/09/12 0400;  aspirin chewable tablet 81 mg, 81 mg, Oral, Daily, Alyson Reedy, MD, 81 mg at 11/09/12 1032 atorvastatin (LIPITOR) tablet 80 mg, 80 mg, Per NG tube, q1800, Kathleene Hazel, MD, 80 mg at 11/08/12 1701;  enoxaparin (LOVENOX) injection 40 mg, 40 mg, Subcutaneous, Q24H, Dolores Patty, MD, 40 mg at 11/08/12 1629;  HYDROcodone-acetaminophen (NORCO/VICODIN) 5-325 MG per tablet 1 tablet, 1 tablet, Oral, Q6H PRN, Laurann Montana, PA, 1 tablet at 11/08/12 2113 levalbuterol (XOPENEX) nebulizer solution 0.63 mg, 0.63 mg, Nebulization, Q6H, Storm Frisk, MD, 0.63 mg at 11/09/12 0849;  lisinopril (PRINIVIL,ZESTRIL) tablet 5 mg, 5 mg, Oral, Daily, Hillis Range, MD, 5 mg at  11/09/12 1032;  loperamide (IMODIUM) capsule 2 mg, 2 mg, Oral, PRN, Kathleene Hazel, MD;  metoprolol tartrate (LOPRESSOR) tablet 25 mg, 25 mg, Oral, BID, Kathleene Hazel, MD nitroGLYCERIN (NITROSTAT) SL tablet 0.4 mg, 0.4 mg, Sublingual, Q5 Min x 3 PRN, Roger A Arguello, PA-C;  ondansetron (ZOFRAN) injection 4 mg, 4 mg, Intravenous, Q6H PRN, Gery Pray, PA-C, 4 mg at 11/05/12 1354;  pantoprazole (PROTONIX) EC tablet 40 mg, 40 mg, Oral, Daily, Lonia Farber, MD, 40 mg at 11/09/12 1032;  prasugrel (EFFIENT) tablet 10 mg, 10 mg, Per NG tube, Daily, Kathleene Hazel, MD, 10 mg at 11/09/12 1032 sodium chloride 0.9 % injection 3 mL, 3 mL, Intravenous, Q12H, Roger A Arguello, PA-C, 3 mL at 11/09/12 1032;  sodium chloride 0.9 % injection 3 mL, 3 mL, Intravenous, PRN, Gery Pray, PA-C  Patients Current Diet: Cardiac  Precautions / Restrictions Precautions Precautions: Fall Restrictions Weight Bearing Restrictions: No Other Position/Activity Restrictions: RIJ line   Prior Activity Level Homemaker, working cattle farm  Journalist, newspaper / Equipment Home Assistive Devices/Equipment: None Home Adaptive Equipment: None (husband may have RW from prior surgery)  Prior Functional Level Prior Function Level of Independence: Independent Able to Take Stairs?: Yes Driving: Yes Vocation: Works at home  Current Functional Level Cognition  Arousal/Alertness: Awake/alert Overall Cognitive Status: Appears within functional limits for tasks assessed/performed Current Attention Level: Focused Memory Deficits: unable to recall what staff and family have told her about where she is and why Orientation Level: Oriented to person;Oriented to place;Oriented to time;Oriented to situation Following Commands: Follows one step commands inconsistently;Follows one step commands with increased time Cognition - Other Comments: Patient alert this session. Able to recall past  events and understanding hospital course. Patient aware she is unsafe to return home in the state that she is in.    Extremity Assessment (includes Sensation/Coordination)  RUE ROM/Strength/Tone: Deficits RUE ROM/Strength/Tone Deficits: PROM WFL with shoulder flexion to 90 only due to IJ line; pt actively resisted elbow flexion 4/5, actively internally rotated from position of ER  RLE ROM/Strength/Tone: Deficits;Unable to fully assess;Due to impaired cognition RLE ROM/Strength/Tone Deficits: PROM WFL; pt actively assisted with some movements and resisted others; hip flexion 2+/5, hip extension 4/5, ankle DF 2+/5, plantarflexion 3+/5    ADLs       Mobility  Bed Mobility: Not assessed    Transfers  Transfers: Sit to Stand;Stand to Sit Sit to Stand: 4: Min assist;With upper extremity assist;From chair/3-in-1 Sit to Stand: Patient Percentage: 60% Stand to Sit: 4: Min assist;With upper extremity assist;To chair/3-in-1 Stand to Sit: Patient Percentage: 60%    Ambulation / Gait / Stairs / Psychologist, prison and probation services  Ambulation/Gait Ambulation/Gait Assistance: 4: Min assist Ambulation/Gait: Patient Percentage: 70% Ambulation Distance (Feet): 180 Feet Assistive device: Rolling walker Ambulation/Gait Assistance Details: Patient required 2 seated rest breaks with ambulaion but progressing well. SOB and fatique are limiting factors Gait Pattern: Decreased stride length;Decreased step length - right;Decreased step length - left;Narrow base of support;Trunk flexed Gait velocity: decreased    Posture / Balance Static Standing Balance Static Standing - Balance Support: Bilateral upper extremity supported Static Standing - Level of Assistance: 1: +2 Total assist Static Standing - Comment/# of Minutes: Stood x2 min until needing to sit due to fatique    Special needs/care consideration BiPAP/CPAP no CPM no Continuous Drip IV no Dialysis no        Days no Life Vest no Oxygen no Special Bed no Trach  Size no Wound Vac (area) no      Location no Skin reddened perieanl due to diarhea                              Location Bowel mgmt:diarhea /incont/ wearing depends Bladder mgmt: continent Diabetic mgmt   Previous Home Environment Living Arrangements: Spouse/significant other Lives With: Spouse Available Help at Discharge: Family Type of Home: House Home Layout: One level;Other (Comment) (basement, but does not use) Home Access: Stairs to enter Entrance Stairs-Rails: None (son plans to install one) Entrance Stairs-Number of Steps: 4 Home Care Services: No Additional Comments: 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; 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  Discharge Living Setting Plans for Discharge Living Setting: Patient's home;Lives with (comment) (spouse) Type of Home at Discharge: House Discharge Home Layout: One level Discharge Home Access: Stairs to enter Entrance Stairs-Rails: None Entrance Stairs-Number of Steps: 4 steps Do you have any problems obtaining your medications?: No  Social/Family/Support Systems Patient Roles: Spouse;Parent;Other (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) Solicitor Information: Samantha King, spouse Anticipated Caregiver: spouse Anticipated Caregiver's Contact Information: cell [phone] Ability/Limitations of Caregiver: supervision to min assist Caregiver Availability: 24/7 Discharge Plan Discussed with Primary Caregiver: Yes Is Caregiver In Agreement with Plan?: Yes Does Caregiver/Family have Issues with Lodging/Transportation while Pt is in Rehab?: No    Goals/Additional Needs Patient/Family Goal for Rehab: Mod I PT, OT, and SLP Expected length of stay: ELOS 7 days Dietary Needs: cardiac diet Special Service Needs: needs OP cardiac rehab Pt/Family Agrees to Admission and willing to participate: Yes Program Orientation Provided & Reviewed with Pt/Caregiver Including Roles  & Responsibilities: Yes   Decrease burden of Care  through IP rehab admission: n/a   Possible need for SNF placement upon discharge: not anticipated   Patient Condition: This patient's condition remains as documented in the Consult dated 11/09/12, in which the Rehabilitation Physician determined and documented that the patient's condition is appropriate for intensive rehabilitative care in an inpatient rehabilitation facility.  Preadmission Screen Completed By:  Clois Dupes, 11/09/2012 2:52 PM ______________________________________________________________________   Discussed status with Dr. Riley Kill on 11/09/12 at  1452 and received telephone approval for admission today.  Admission Coordinator:  Jannine, Abreu, time 0981 Date 11/09/2012.

## 2012-11-09 NOTE — Progress Notes (Signed)
Physical Therapy Treatment Patient Details Name: Samantha King MRN: [id number] DOB: 1946/02/19 Today's Date: 11/09/2012 Time: 4098-1191 PT Time Calculation (min): 24 min  PT Assessment / Plan / Recommendation Comments on Treatment Session  Patient progressing well with ambulation this session. She is highly motivated and hopeful for CIR today so to return home with her husband .     Follow Up Recommendations  CIR;Supervision/Assistance - 24 hour     Does the patient have the potential to tolerate intense rehabilitation     Barriers to Discharge        Equipment Recommendations       Recommendations for Other Services    Frequency Min 3X/week   Plan Discharge plan remains appropriate;Frequency remains appropriate    Precautions / Restrictions Precautions Precautions: Fall   Pertinent Vitals/Pain     Mobility  Transfers Sit to Stand: 4: Min assist;With upper extremity assist;From chair/3-in-1 Stand to Sit: 4: Min assist;With upper extremity assist;To chair/3-in-1 Details for Transfer Assistance: Patient stood x2 needing reinforcement for safe technique and assistance to achieve full upright position. Required increased effort Ambulation/Gait Ambulation/Gait Assistance: 4: Min assist Ambulation Distance (Feet): 180 Feet Assistive device: Rolling walker Ambulation/Gait Assistance Details: Patient required 2 seated rest breaks with ambulaion but progressing well. SOB and fatique are limiting factors Gait Pattern: Decreased stride length;Decreased step length - right;Decreased step length - left;Narrow base of support;Trunk flexed    Exercises     PT Diagnosis:    PT Problem List:   PT Treatment Interventions:     PT Goals Acute Rehab PT Goals PT Goal: Sit to Stand - Progress: Met PT Goal: Stand to Sit - Progress: Met PT Goal: Ambulate - Progress: Met Additional Goals PT Goal: Additional Goal #1 - Progress: Met  Visit Information  Last PT Received On:  11/09/12 Assistance Needed: +2 (for ambulation and safety)    Subjective Data      Cognition  Overall Cognitive Status: Appears within functional limits for tasks assessed/performed Arousal/Alertness: Awake/alert Orientation Level: Appears intact for tasks assessed Behavior During Session: [facility] for tasks performed    Balance     End of Session PT - End of Session Equipment Utilized During Treatment: Gait belt Activity Tolerance: Patient tolerated treatment well;Patient limited by fatigue Patient left: in chair;with call bell/phone within reach Nurse Communication: Mobility status   GP     Fredrich Birks 11/09/2012, 11:33 AM  11/09/2012 Fredrich Birks PTA [phone] pager [phone] office

## 2012-11-09 NOTE — Progress Notes (Signed)
[id number] Cardiac Rehab Completed MI and stent education with pt and husband. Discussed smoking cessation and gave them tips for quitting and coaching contact number.They voice understanding. Pt agrees to [facility]. [facility] in [place], will send referral.

## 2012-11-09 NOTE — Progress Notes (Signed)
Pt arrived to unit at 1700 with husband at bedside. Belongings are in pt room. Reviewed and educated pt and family on rehab process with verbal understanding. Discussed safety plan with pt signing sheet with understanding. Pt resting in chair, call bell in reach.

## 2012-11-09 NOTE — Progress Notes (Signed)
Patient  desaturation at 88% complaining of shortness of breath with  episodes of cyanosis at 22;22 Immediately called staff,Charge Nurse. RespiratoryTherapist and Rapid Response team. Marissa Nestle, NP notified. N.O.  O2 at 4 liters. EKG normal. CBG 151. Vital signs: 176/92 HR 81, R 26, O2 96% with 4L of Oxygen. Rechecked VSS: 137/83. HR 84, R 24, O2 sat 99%. Patient's spouse notified. Patient resting quietly at this time. Will continue to monitor frequently.

## 2012-11-09 NOTE — Progress Notes (Signed)
I met with pt and her spouse at bedside. CIR bed is available today and they are in agreement to admit. Samantha Santee, RN, will contact Attending MD to obtain d/c order. [phone]

## 2012-11-09 NOTE — H&P (Signed)
Physical Medicine and Rehabilitation Admission H&P  Chief Complaint   Patient presents with   .  Encephalopathy   :  HPI: Samantha King is a 66 y.o. female smoker with history of HTN, admitted on 12/20 after VF arrest in setting of STEMI. Had < 1 minute of CPR. Was oriented on arrival to ER. Went to cath lab where was found to have occluded RCA. Developed shock and hypoperfusion during case. Intubated. DES placed in RCA. IABP placed. Influenza PCR positive for H1N1 and CXR with LLL CAP. Treated with tamiflu and unasyn-->changed to Augmentin thru 1/2. VT treated with amiodarone. Fluid overload treated with diuretics and patient extubated on 12/26. She developed heme positive stools treated with BID protonix. She has had confusion with hallucinations treated with Zyprexa. CT head 12/24 without acute changes. PT evaluation done 12/27 and patient noted to be paranoid had difficulty following commands. Mentation slowly improving but she continues with balance and gait deficits. PT, CM, MD recommending CIR. After consultation, the patient was admitted for CIR therapies today.  ROS: Weak, hoarse voice. Denies sob. Positive fatigue. Occasional cough. A 12 point review of systems has been performed and if not noted above is otherwise negative.  Past Medical History   Diagnosis  Date   .  Hypertension    .  MI (myocardial infarction)    .  GERD (gastroesophageal reflux disease)    .  Complication of anesthesia    .  PONV (postoperative nausea and vomiting)    .  Pneumonia  2013    Past Surgical History   Procedure  Date   .  Tubal ligation     Family History   Problem  Relation  Age of Onset   .  Heart attack  Brother     Social History: reports that she quit smoking 4 days ago. Her smoking use included Cigarettes. She has a 20 pack-year smoking history. She quit smokeless tobacco use about a year ago. She reports that she does not drink alcohol or use illicit drugs.  Allergies: No Known  Allergies  Scheduled Meds:  .  amoxicillin-clavulanate  1 tablet  Oral  BID   .  antiseptic oral rinse  15 mL  Mouth Rinse  QID   .  aspirin  81 mg  Oral  Daily   .  atorvastatin  80 mg  Per NG tube  q1800   .  enoxaparin (LOVENOX) injection  40 mg  Subcutaneous  Q24H   .  levalbuterol  0.63 mg  Nebulization  Q6H   .  lisinopril  5 mg  Oral  Daily   .  metoprolol tartrate  25 mg  Oral  BID   .  pantoprazole  40 mg  Oral  Daily   .  prasugrel  10 mg  Per NG tube  Daily   .  sodium chloride  3 mL  Intravenous  Q12H    Medications Prior to Admission   Medication  Sig  Dispense  Refill   .  amLODipine (NORVASC) 5 MG tablet  Take 5 mg by mouth daily.     [name]  amoxicillin (AMOXIL) 875 MG tablet  Take 875 mg by mouth 2 (two) times daily. For a 10 day supply.     [name]  aspirin 81 MG tablet  Take 81 mg by mouth daily.     .  Aspirin-Acetaminophen-Caffeine (EXCEDRIN PO)  Take 2-4 tablets by mouth daily as needed. Generally took 2 to 4  tabs daily for sinus headaches.     .  citalopram (CELEXA) 20 MG tablet  Take 20 mg by mouth daily.     .  fish oil-omega-3 fatty acids 1000 MG capsule  Take 1 g by mouth daily.     Boris Lown Oil 500 MG CAPS  Take 1 capsule by mouth daily.     .  Multiple Vitamins-Minerals (CENTRUM PO)  Take 1 tablet by mouth daily.     [name]  omeprazole (PRILOSEC) 40 MG capsule  Take 40 mg by mouth daily.     [name]  oseltamivir (TAMIFLU) 75 MG capsule  Take 75 mg by mouth daily.     [name]  OVER THE COUNTER MEDICATION  1 capsule. Large yellow capsule shaped with no markings, unidentifiable.     [name]  OVER THE COUNTER MEDICATION  1 tablet. Large white with one side score mark, partial label looks like "nature's made cho"?     [name]  POTASSIUM GLUCONATE PO  Take 1 tablet by mouth daily. 99mg  tab     .  promethazine-dextromethorphan (PROMETHAZINE-DM) 6.25-15 MG/5ML syrup  Take 5 mLs by mouth 4 (four) times daily as needed. For 6 days.     [name]  psyllium (REGULOID) 0.52 G capsule  Take 0.52 g by mouth daily.       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  Functional History:  Prior Function  Able to Take Stairs?: Yes  Driving: Yes  Vocation: Works at home  Functional Status:  Mobility:  Bed Mobility  Bed Mobility: Not assessed  Transfers  Transfers: Sit to Stand;Stand to [employer] to Stand: 4: Min assist;With upper extremity assist;From chair/3-in-1  Sit to Stand: Patient Percentage: 60%  Stand to Sit: 4: Min assist;With upper extremity assist;To chair/3-in-1  Stand to Sit: Patient Percentage: 60%  Ambulation/Gait  Ambulation/Gait Assistance: 4: Min assist  Ambulation/Gait: Patient Percentage: 70%  Ambulation Distance (Feet): 180 Feet  Assistive device: Rolling walker  Ambulation/Gait Assistance Details: Patient required 2 seated rest breaks with ambulaion but progressing well. SOB and fatique are limiting factors  Gait Pattern: Decreased stride length;Decreased step length - right;Decreased step length - left;Narrow base of support;Trunk flexed  Gait velocity: decreased   ADL:   Cognition:  Cognition  Arousal/Alertness: Awake/alert  Orientation Level: Oriented to person;Oriented to place;Oriented to time;Oriented to situation  Cognition  Overall Cognitive Status: Appears within functional limits for tasks assessed/performed  Area of Impairment: Attention;Memory;Following commands;Safety/judgement  Arousal/Alertness: Awake/alert  Orientation Level: Appears intact for tasks assessed  Behavior During Session: [facility] for tasks performed  Current Attention Level:  Focused  Memory Deficits: unable to recall what staff and family have told her about where she is and why  Following Commands: Follows one step commands inconsistently;Follows one step commands with increased time  Cognition - Other Comments: Patient alert this session. Able to recall past events and understanding hospital course. Patient aware she is unsafe to return home in the state that she is in.  Blood pressure 127/77, pulse 96, temperature 99.8 F (37.7 C), temperature  source Oral, resp. rate 18, height 5\' 2"  (1.575 m), weight 76 kg (167 lb 8.8 oz), SpO2 100.00%  PHYSICAL EXAM  General: Alert and oriented x 3, No apparent distress HEENT: Head is normocephalic, atraumatic, PERRLA, EOMI, sclera anicteric, oral mucosa pink and moist, dentition intact, ext ear canals clear, speech dysphonic but intelligible in a quite environment Neck: Supple without JVD or lymphadenopathy Heart: Reg rate and rhythm. No murmurs rubs or gallops Chest: CTA bilaterally without wheezes, rales, or rhonchi; no distress Abdomen: Soft, non-tender, non-distended, bowel sounds positive. Extremities: No clubbing, cyanosis, or edema. Pulses are 2+ Skin: Clean and intact without signs of breakdown Neuro: Pt is impulsive. No CN deficits. Speech clear. Strength 3-4/5 UE. LE 2+ prox to 4/5 distally. No sensory deficits appreciated. No ataxia. DTR's are 1+. Fair sitting balance. Musculoskeletal: Full ROM with normal movements., No pain with AROM or PROM in the neck, trunk, or extremities. Posture appropriate Psych: Pt's affect is appropriate. Pt is cooperative   Results for orders placed during the hospital encounter of 10/29/12 (from the past 48 hour(s))   CBC Status: Abnormal    Collection Time    11/08/12 5:42 AM   Component  Value  Range  Comment    WBC  20.6 (*)  4.0 - 10.5 K/uL     RBC  3.83 (*)  3.87 - 5.11 MIL/uL     Hemoglobin  11.6 (*)  12.0 - 15.0 g/dL     HCT  16.1 (*)  09.6 - 46.0 %     MCV  93.5  78.0  - 100.0 fL     MCH  30.3  26.0 - 34.0 pg     MCHC  32.4  30.0 - 36.0 g/dL     RDW  04.5  40.9 - 81.1 %     Platelets  443 (*)  150 - 400 K/uL    BASIC METABOLIC PANEL Status: Abnormal    Collection Time    11/08/12 5:42 AM   Component  Value  Range  Comment    Sodium  138  135 - 145 mEq/L     Potassium  3.5  3.5 - 5.1 mEq/L     Chloride  102  96 - 112 mEq/L     CO2  25  19 - 32 mEq/L     Glucose, Bld  118 (*)  70 - 99 mg/dL     BUN  19  6 - 23 mg/dL     Creatinine, Ser  9.14  0.50 - 1.10 mg/dL     Calcium  9.3  8.4 - 10.5 mg/dL     GFR calc non Af Amer  90 (*)  >90 mL/min     GFR calc Af Amer  >90  >90 mL/min    MAGNESIUM Status: Normal    Collection Time    11/08/12 5:42 AM   Component  Value  Range  Comment    Magnesium  2.1  1.5 - 2.5 mg/dL    PHOSPHORUS Status: Normal    Collection Time    11/08/12 5:42 AM   Component  Value  Range  Comment    Phosphorus  2.7  2.3 - 4.6 mg/dL    BASIC METABOLIC PANEL Status: Abnormal    Collection Time    11/09/12 5:04 AM   Component  Value  Range  Comment    Sodium  140  135 - 145 mEq/L     Potassium  3.7  3.5 -  5.1 mEq/L     Chloride  107  96 - 112 mEq/L     CO2  24  19 - 32 mEq/L     Glucose, Bld  98  70 - 99 mg/dL     BUN  18  6 - 23 mg/dL     Creatinine, Ser  1.47  0.50 - 1.10 mg/dL     Calcium  9.2  8.4 - 10.5 mg/dL     GFR calc non Af Amer  87 (*)  >90 mL/min     GFR calc Af Amer  >90  >90 mL/min     Dg Chest 2 View  11/09/2012 *RADIOLOGY REPORT* Clinical Data: Evaluate pneumonia. Status post cardiac arrest. CHEST - 2 VIEW Comparison: 11/07/2012 Findings: Normal heart size. There is a small left effusion, similar to previous exam. The atelectasis is noted in the left base. No new findings identified. No airspace consolidation identified. IMPRESSION: 1. Persistent left effusion with overlying plate-like atelectasis. Original Report Authenticated By: Signa Kell, M.D.   Post Admission Physician Evaluation:  1. Functional  deficits secondary to deconditioning, encephalopathy after cardiogenic shock. 2. Patient is admitted to receive collaborative, interdisciplinary care between the physiatrist, rehab nursing staff, and therapy team. 3. Patient's level of medical complexity and substantial therapy needs in context of that medical necessity cannot be provided at a lesser intensity of care such as a SNF. 4. Patient has experienced substantial functional loss from his/her baseline which was documented above under the "Functional History" and "Functional Status" headings. Judging by the patient's diagnosis, physical exam, and functional history, the patient has potential for functional progress which will result in measurable gains while on inpatient rehab. These gains will be of substantial and practical use upon discharge in facilitating mobility and self-care at the household level. 5. Physiatrist will provide 24 hour management of medical needs as well as oversight of the therapy plan/treatment and provide guidance as appropriate regarding the interaction of the two. 6. 24 hour rehab nursing will assist with bladder management, bowel management, safety, skin/wound care, disease management, medication administration, pain management and patient education and help integrate therapy concepts, techniques,education, etc. 7. PT will assess and treat for: Lower extremity strength, range of motion, stamina, balance, functional mobility, safety, adaptive techniques and equipment, CPT, NMR, education. Goals are: mod I to supervision. 8. OT will assess and treat for: ADL's, functional mobility, safety, upper extremity strength, adaptive techniques and equipment, NMR, CPT, education.. Goals are: mod I to supervision. 9. SLP will assess and treat for: cognition, speech and communication. Goals are: mod I. 10. Case Management and Social Worker will assess and treat for psychological issues and discharge planning. 11. Team conference will  be held weekly to assess progress toward goals and to determine barriers to discharge. 12. Patient will receive at least 3 hours of therapy per day at least 5 days per week. 13. ELOS: 7-10 days Prognosis: excellent Medical Problem List and Plan:  1. DVT Prophylaxis/Anticoagulation: Mechanical:  Antiembolism stockings, knee (TED hose) Bilateral lower extremities , lovenox 2. Pain Management: tylenol, tramadol, modalities, stretching as needed  3. Mood: team to provide ego support as needed. Seems to be generally positive and enthusiastic about her rehab  4. Neuropsych: This patient is capable of making decisions on his/her own behalf.  5. Persistent leukocytosis: continue Augmentin through 1/2. May need antibiotic course broadened if she spikes a fever.  Will monitor for now. No active signs of infection 6. PUD: continue to monitor H/H.  Stool guaiac checks X 3. Continue BID protonix.  7. CV: maintain lisinopril for afterload reduction and metoprolol for rate control. Effient per cards as well. Follow for stability of cardiac parameters with increased physical activity.  11/09/2012

## 2012-11-09 NOTE — Progress Notes (Signed)
Patient discharging to inpatient rehab. Report called to [facility] on 4100.  Patient updated.  Will continue to monitor.  Colman Cater

## 2012-11-09 NOTE — Consult Note (Signed)
Physical Medicine and Rehabilitation Consult Reason for Consult: Encephalopathy Referring Physician: Dr. Clifton James.    HPI: Samantha King is a 66 y.o. female smoker with history of HTN, admitted on 12/20 after VF arrest in setting of STEMI. Had < 1 minute of CPR. Was oriented on arrival to ER. Went to cath lab where was found to have occluded RCA. Developed shock and hypoperfusion during case. Intubated. DES placed in RCA. IABP placed. Influenza PCR positive for H1N1 and  CXR with LLL CAP. Treated with tamiflu and unasyn-->changed to Augmentin thru 1/2. VT treated with amiodarone. Fluid overload treated with diuretics and patient extubated on 12/26. She developed heme positive stools treated with BID protonix. She has had confusion with hallucinations treated with Zyprexa. CT head 12/24 without acute changes. PT evaluation done 12/27 and patient noted to be paranoid had difficulty following commands. Mentation slowly improving but she continues with balance and gait deficits. PT, CM, MD recommending CIR.   ROS Past Medical History  Diagnosis Date  . Hypertension   . MI (myocardial infarction)   . GERD (gastroesophageal reflux disease)   . Complication of anesthesia   . PONV (postoperative nausea and vomiting)   . Pneumonia 2013   Past Surgical History  Procedure Date  . Tubal ligation    Family History  Problem Relation Age of Onset  . Heart attack Brother    Social History:  reports that she quit smoking 4 days ago. Her smoking use included Cigarettes. She has a 20 pack-year smoking history. She quit smokeless tobacco use about a year ago. She reports that she does not drink alcohol or use illicit drugs. Allergies: No Known Allergies Medications Prior to Admission  Medication Sig Dispense Refill  . amLODipine (NORVASC) 5 MG tablet Take 5 mg by mouth daily.      [name] amoxicillin (AMOXIL) 875 MG tablet Take 875 mg by mouth 2 (two) times daily. For a 10 day supply.      [name] aspirin 81 MG  tablet Take 81 mg by mouth daily.      . Aspirin-Acetaminophen-Caffeine (EXCEDRIN PO) Take 2-4 tablets by mouth daily as needed. Generally took 2 to 4 tabs daily for sinus headaches.      . citalopram (CELEXA) 20 MG tablet Take 20 mg by mouth daily.      . fish oil-omega-3 fatty acids 1000 MG capsule Take 1 g by mouth daily.      Boris Lown Oil 500 MG CAPS Take 1 capsule by mouth daily.      . Multiple Vitamins-Minerals (CENTRUM PO) Take 1 tablet by mouth daily.      [name] omeprazole (PRILOSEC) 40 MG capsule Take 40 mg by mouth daily.      [name] oseltamivir (TAMIFLU) 75 MG capsule Take 75 mg by mouth daily.      [name] OVER THE COUNTER MEDICATION 1 capsule. Large yellow capsule shaped with no markings, unidentifiable.      [name] OVER THE COUNTER MEDICATION 1 tablet. Large white with one side score mark, partial label looks like "nature's made cho"?      [name] POTASSIUM GLUCONATE PO Take 1 tablet by mouth daily. 99mg  tab      . promethazine-dextromethorphan (PROMETHAZINE-DM) 6.25-15 MG/5ML syrup Take 5 mLs by mouth 4 (four) times daily as needed. For 6 days.      [name] psyllium (REGULOID) 0.52 G capsule Take 0.52 g by mouth daily.        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  Functional History: Prior Function Able to Take Stairs?: Yes Driving: Yes Vocation: Works at home Functional Status:  Mobility: Bed Mobility Bed Mobility: Not assessed Transfers Transfers: Sit to Stand;Stand to [employer] to Stand: 1: +2 Total assist;From chair/3-in-1;From bed;With upper extremity assist Sit to Stand: Patient  Percentage: 60% Stand to Sit: With upper extremity assist;To chair/3-in-1;To bed;1: +2 Total assist Stand to Sit: Patient Percentage: 60% Ambulation/Gait Ambulation/Gait Assistance: 1: +2 Total assist Ambulation/Gait: Patient Percentage: 70% Ambulation Distance (Feet): 15 Feet Assistive device: 2 person hand held assist Ambulation/Gait Assistance Details: Patient able to ambulate across room with +2 HHA for stability and balance. Patient with decrease step length and several breaks due to fatique. Patient encouraged but unable to ambulate further. O2 sats remained above 94% on 2L with ambulation.  Gait Pattern: Shuffle;Scissoring;Step-to pattern;Narrow base of support Gait velocity: decreased    ADL:    Cognition: Cognition Arousal/Alertness: Awake/alert Orientation Level: Oriented to person;Oriented to place;Oriented to time;Oriented to situation Cognition Overall Cognitive Status: Appears within functional limits for tasks assessed/performed Area of Impairment: Attention;Memory;Following commands;Safety/judgement Arousal/Alertness: Awake/alert Orientation Level: Appears intact for tasks assessed Behavior During Session: [facility] for tasks performed Current Attention Level: Focused Memory Deficits: unable to recall what staff and family have told her about where she is and why Following Commands: Follows one step commands inconsistently;Follows one step commands with increased time Cognition - Other Comments: Patient alert this session. Able to recall past events and understanding hospital course. Patient aware she is unsafe to return home in the state that she is in.  Blood pressure 105/69, pulse 83, temperature 99.8 F (37.7 C), temperature source Oral, resp. rate 18, height 5\' 2"  (1.575 m), weight 76 kg (167 lb 8.8 oz), SpO2 100.00%.  Physical Exam  Constitutional: She is oriented to person, place, and time.  Neck:       Dysphonic voice, occasional delivery of full sound. Tongue,  lips with scabbing  Neurological: She is alert and oriented to person, place, and time. No cranial nerve deficit.       Good sitting balance. Strength 3 proximal to 4/ sistal in UE's. LE 2+ to 3 prox to 4 distally. No sensory deficits.     Results for orders placed during the hospital encounter of 10/29/12 (from the past 24 hour(s))  BASIC METABOLIC PANEL     Status: Abnormal   Collection Time   11/09/12  5:04 AM      Component Value Range   Sodium 140  135 - 145 mEq/L   Potassium 3.7  3.5 - 5.1 mEq/L   Chloride 107  96 - 112 mEq/L   CO2 24  19 - 32 mEq/L   Glucose, Bld 98  70 - 99 mg/dL   BUN 18  6 - 23 mg/dL   Creatinine, Ser 4.69  0.50 - 1.10 mg/dL   Calcium 9.2  8.4 - 62.9 mg/dL   GFR calc non Af Amer 87 (*) >90 mL/min   GFR calc Af Amer >90  >90 mL/min   No results found.  Assessment/Plan: Diagnosis: deconditioning, encephalopathy after cardiogenic shock 1. Does the need for close, 24  hr/day medical supervision in concert with the patient's rehab needs make it unreasonable for this patient to be served in a less intensive setting? Yes 2. Co-Morbidities requiring supervision/potential complications: H1N1, ABLA 3. Due to bladder management, bowel management, safety, skin/wound care, disease management, medication administration, pain management and patient education, does the patient require 24 hr/day rehab nursing? Yes 4. Does the patient require coordinated care of a physician, rehab nurse, PT (1-2 hrs/day, 5 days/week), OT (1-2 hrs/day, 5 days/week) and SLP (1-2 hrs/day, 5 days/week) to address physical and functional deficits in the context of the above medical diagnosis(es)? Yes Addressing deficits in the following areas: balance, endurance, locomotion, strength, transferring, bowel/bladder control, bathing, dressing, feeding, grooming, toileting, cognition, speech and psychosocial support 5. Can the patient actively participate in an intensive therapy program of at least 3 hrs  of therapy per day at least 5 days per week? Yes 6. The potential for patient to make measurable gains while on inpatient rehab is excellent 7. Anticipated functional outcomes upon discharge from inpatient rehab are mod I with PT, mod I with OT, mod I with SLP. 8. Estimated rehab length of stay to reach the above functional goals is: 7 days 9. Does the patient have adequate social supports to accommodate these discharge functional goals? Yes 10. Anticipated D/C setting: Home 11. Anticipated post D/C treatments: HH therapy 12. Overall Rehab/Functional Prognosis: excellent  RECOMMENDATIONS: This patient's condition is appropriate for continued rehabilitative care in the following setting: CIR Patient has agreed to participate in recommended program. Yes Note that insurance prior authorization may be required for reimbursement for recommended care.  Comment:Rehab RN to follow up.   Ivory Broad, MD     11/09/2012

## 2012-11-09 NOTE — Discharge Summary (Signed)
Discharge Summary   Patient ID: Samantha King,  MRN: [id number], DOB/AGE: 68-Mar-1947 68 y.o.  Admit date: 10/29/2012 Discharge date: 11/09/2012  Primary Physician: No primary provider on file. Primary Cardiologist: New to cardiology - assessed by Dr. Clifton James  Discharge Diagnoses Principal Problem:  *Cardiac arrest Active Problems:  Hypertension  GERD (gastroesophageal reflux disease)  Acute respiratory failure  Cardiogenic shock  ST elevation myocardial infarction (STEMI) of inferior wall  H1N1 influenza with pneumonia  Acute blood loss anemia  Hypokalemia  Tobacco abuse  NSVT (nonsustained ventricular tachycardia)  Acute paranoia  Severe muscle deconditioning  Hyperlipidemia   Allergies No Known Allergies  Diagnostic Studies/Procedures  CARDIAC CATHETERIZATION + PCI - 10/29/12  Hemodynamic Findings:  Central aortic pressure: 89/57  Left ventricular pressure: 40/19/23  Angiographic Findings:  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 The LAD and Circumflex have minor irregularities.  Right Coronary Artery: Large dominant vessel with 100% occlusion proximally  Left Ventricular Angiogram: Deferred.  Impression:  1. Acute inferior STEMI  2. Occluded proximal RCA now s/p PTCA/DES x 1  3. Cardiogenic shock  4. Complete heart block  Recommendations: Continue IABP at 1:1. CCU. Continue pressors. Continue Integrilin for now. Oral anti-platelet, likely Effient, in am. PCCM consulted.   PORTABLE CHEST X-RAY - 10/29/12  IMPRESSION:  Tubular structures, transvenous pacer and intra-aortic balloon pump appropriately positioned. Bibasilar atelectasis verses airspace disease.  CENTRAL VENOUS CATHETER PLACEMENT - 10/29/12  Evaluation  Blood flow good  Complications: No apparent complications  Patient did tolerate procedure well.  Chest X-ray ordered to verify placement. CXR: pending.  PORTABLE CHEST X-RAY - 10/30/12  IMPRESSION:  Tubular  structures, transvenous pacer and intra-aortic balloon pump appropriately positioned. Bibasilar atelectasis verses airspace disease.  TRANSTHORACIC ECHOCARDIOGRAM - 10/30/12  Left ventricle: Poor image quality but no apparent RWMA;s The cavity size was normal. Wall thickness was increased in a pattern of moderate LVH. Systolic function was normal. The estimated ejection fraction was in the range of 55% to 60%. Wall motion was normal; there were no regional wall motion abnormalities. Pulmonary arteries: PA peak pressure: 45mm Hg (S).  PORTABLE CHEST X-RAY - 10/31/12  IMPRESSION:  1. Stable positioning of support apparatus. No pneumothorax.  2. Grossly unchanged findings most suggestive of asymmetric pulmonary edema.  ARTERIAL CATHETER PLACEMENT - 10/31/12  Evaluation  Blood flow good; BP tracing good.  Complications: No apparent complications.  Aline placed per MD order. Pt tolerated well, RT will continue to monitor.  PORTABLE CHEST X-RAY - 11/01/12  IMPRESSION:  1. Stable support apparatus except the IABP has been removed.  2. Slight increase in vascular congestion and bibasilar atelectasis.   CT ABDOMEN/PELVIS - 11/01/12  IMPRESSION:  Trace low density fluid extending from the right groin along the anterior margin of the psoas muscle. Although it is difficult to obtain accurate Hounsfield unit measurements to determine the composition of the fluid, areas measured are lower than expected for blood products, and if this is a retroperitoneal hematoma, it is extremely small. Small amount of fluid around the gallbladder, of unclear etiology. This may be seen with systemic hypoproteinemia, cholecystitis, or occasionally liver disease. No small amount of subcutaneous edema/stranding. Small bilateral pleural effusions. Constellation of findings suggests mild third spacing and possible volume overload.  PORTABLE CHEST X-RAY - 11/02/12  IMPRESSION:  Improved vascular congestion and bibasilar  airspace disease.  PORTABLE NONCONTRAST HEAD CT - 11/02/12  IMPRESSION:  No acute intracranial abnormality.  ARTERIAL CATHETER PLACEMENT - 11/01/12  Evaluation  Blood flow good; BP tracing good.  Complications: No apparent complications.  PORTABLE CHEST X-RAY - 11/03/12  IMPRESSION:  1. Tip of endotracheal tube 4.2 cm above the carina.  2. Some worsening of pulmonary vascular congestion and basilar atelectasis.   PORTABLE CHEST X-RAY - 11/04/12  IMPRESSION:  1. Persistent asymmetric left basilar airspace disease. This is concerning for pneumonia.  2. Stable mild pulmonary vascular congestion.  3. Satisfactory positioning of the endotracheal tube.  PORTABLE CHEST X-RAY - 11/05/12  IMPRESSION:  Extubated.  Improved left airspace disease.  PORTABLE CHEST X-RAY - 11/07/12  IMPRESSION:  1. Persistent left pleural effusion and left base atelectasis.  PA/LATERAL CHEST X-RAY - 11/09/12  IMPRESSION:  1. Persistent left effusion with overlying plate-like atelectasis.  History of Present Illness  Samantha King is a 66yo female who was admitted to [facility] on 10/29/12 with the above problem list. She has no prior cardiac history. She presented to [facility] ED on 10/29/12 after complaining of severe chest pain radiating to her left arm and jaw with associated diaphoresis. VS were stable on EMS arrival, however ECG revealed findings c/w inferolateral STEMI. She went into cardiac arrest en route, and was successful resuscitated (~ 1 minute of CPR, x 1 defibrillation). EKG findings were confirmed and code STEMI was activated on arrival. On interview, she denied past medical history, but outpatient medications included amlodipine, omeprazole and Tamiflu.   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 She was loaded with Effient with recommendations to resume ASA/Effient x 1 year. A transvenous pacemaker was placed for bradycardia. She became hypotensive and developed ventricular fibrillation again on the table and was successfully defibrillated back to NSR. She developed cardiogenic shock requiring IABP and pressor support. She became hypoxic, acidotic and was intubated thereafter with transferred to the CCU with assisted management from pulmonary/CCM.  CXR as above revealed appropriate PM and IABP placement, and bibasilar atelectasis vs airspace disease.  CV and arterial catheters placed to assess hemodynamics. She unfortunately was found to be oozing blood from her mouth/ET tube. Integrillin which had been continued from the cath lab was replaced with heparin. Amiodarone was stopped. She was gently diuresed and pressors weaned very gradually. TTE as above revealed LVEF 55%, inferior wall HK and normal RV. Large inferior +/- RV infarct suspected given need to multiple high volume IV boluses. Amiodarone was started for intermittent NSVT. IABP was successfully discontinued.   Given recent Tamiflu prescription, influenza PCR was ordered revealing positive H1N1 influenza. Sputum culture revealed non-pathogenic oropharyngeal flora. Tamiflu was continued. A full 10-day course was completed. She did develop concomitant LLL for which Augmentin was started. This will be completed on 11/11/2012. Serial CXRs as above revealed improved pulmonary vascular congestion, and persistent left pleural effusion with atelectasis.    An abdominal/pelvic CT was ordered given anemia to exclude GIB revealing nonspecific fluid intra-abdominal and peri-cholecystic fluid. Third spacing was favored for the former, while the later represented nonspecific findings. She diuresed well (total I/O - 3164 cc), and was eventually weaned off of pressors and extubated. She did develop transient acute  paranoia/delirium post-extubation improved with Haldol. Zyprexa was started transiently, and mental status improved. This was suspected to be secondary to ICU encephalopathy.   She was eventually started on low-dose BB and ACEi. Amiodarone  was stopped. Metabolic derangements were corrected. Of note, FOBT was found to be positive. Protonix was increased to BID dosing given history of PUD. Anemia remained stable. She eventually ambulated with assistance slowly. PT consult recommended inpatient rehab. She was transferred to telemetry and remained stable. She was assessed by Dr. Clifton James today who deemed her stable for discharge to inpatient rehab. She will continue ASA/Effient/ACEi/BB/statin. Avoid QT prolonging drugs. She ambulated well with cardiac rehab and will pursue outpatient cardiac rehab. She will be discharged to inpatient rehab today.   Discharge Vitals:  Blood pressure 127/77, pulse 96, temperature 99.8 F (37.7 C), temperature source Oral, resp. rate 18, height 5\' 2"  (1.575 m), weight 76 kg (167 lb 8.8 oz), SpO2 100.00%.   Labs: Recent Labs  [facility] 11/08/12 0542 11/07/12 0624   WBC 20.6* 15.6*   HGB 11.6* 11.5*   HCT 35.8* 36.2   MCV 93.5 93.8   PLT 443* 427*    Lab 11/09/12 0504 11/08/12 0542 11/07/12 0624  NA 140 138 140  K 3.7 3.5 3.5  CL 107 102 101  CO2 24 25 27   BUN 18 19 16   CREATININE 0.69 0.64 0.62  CALCIUM 9.2 9.3 9.2  PROT -- -- --  BILITOT -- -- --  ALKPHOS -- -- --  ALT -- -- --  AST -- -- --  AMYLASE -- -- --  LIPASE -- -- --  GLUCOSE 98 118* 100*   Recent Labs  Basename 11/07/12 0624   CKTOTAL 66   CKMB 3.1   CKMBINDEX --   TROPONINI 0.64*   Disposition:      Discharge Orders    Future Appointments: Provider: Department: Dept Phone: Center:   11/11/2012 1:30 PM Fredia Beets, OT [facility]  [address] INPATIENT  REHAB [phone] None     Future Orders Please Complete By Expires   Amb Referral to Cardiac Rehabilitation        Comments:   Pt agrees to Outpt. CRP in [place], will send referral.       Discharge Medications:    Medication List     As of 11/09/2012  3:39 PM    CONTINUE taking these medications         amLODipine 5 MG tablet   Commonly known as: NORVASC      amoxicillin 875 MG tablet   Commonly known as: AMOXIL      aspirin 81 MG tablet      CENTRUM PO      citalopram 20 MG tablet   Commonly known as: CELEXA      EXCEDRIN PO      fish oil-omega-3 fatty acids 1000 MG capsule      Krill Oil 500 MG Caps      omeprazole 40 MG capsule   Commonly known as: PRILOSEC      OVER THE COUNTER MEDICATION      OVER THE COUNTER MEDICATION      POTASSIUM GLUCONATE PO      promethazine-dextromethorphan 6.25-15 MG/5ML syrup   Commonly known as: PROMETHAZINE-DM      psyllium 0.52 G capsule   Commonly known as: REGULOID      TAMIFLU 75 MG capsule   Generic drug: oseltamivir         Outstanding Labs/Studies: None  Duration of Discharge Encounter: Greater than 30 minutes including physician time.  Signed, R. Hurman Horn, PA-C 11/09/2012, 3:39 PM

## 2012-11-10 ENCOUNTER — Inpatient Hospital Stay (HOSPITAL_COMMUNITY): Payer: Medicare Other | Admitting: Occupational Therapy

## 2012-11-10 DIAGNOSIS — G931 Anoxic brain damage, not elsewhere classified: Secondary | ICD-10-CM

## 2012-11-10 DIAGNOSIS — R5381 Other malaise: Secondary | ICD-10-CM

## 2012-11-10 DIAGNOSIS — I2510 Atherosclerotic heart disease of native coronary artery without angina pectoris: Secondary | ICD-10-CM

## 2012-11-10 DIAGNOSIS — I469 Cardiac arrest, cause unspecified: Secondary | ICD-10-CM

## 2012-11-10 DIAGNOSIS — F341 Dysthymic disorder: Secondary | ICD-10-CM

## 2012-11-10 DIAGNOSIS — I2119 ST elevation (STEMI) myocardial infarction involving other coronary artery of inferior wall: Secondary | ICD-10-CM

## 2012-11-10 LAB — BASIC METABOLIC PANEL
BUN: 18 mg/dL (ref 6–23)
CO2: 25 mEq/L (ref 19–32)
Chloride: 108 mEq/L (ref 96–112)
Creatinine, Ser: 0.63 mg/dL (ref 0.50–1.10)
Potassium: 3.2 mEq/L — ABNORMAL LOW (ref 3.5–5.1)

## 2012-11-10 LAB — CLOSTRIDIUM DIFFICILE BY PCR: Toxigenic C. Difficile by PCR: NEGATIVE

## 2012-11-10 MED ORDER — SACCHAROMYCES BOULARDII 250 MG PO CAPS
250.0000 mg | ORAL_CAPSULE | Freq: Two times a day (BID) | ORAL | Status: DC
  Administered 2012-11-10 (×2): 250 mg via ORAL
  Filled 2012-11-10 (×5): qty 1

## 2012-11-10 NOTE — Plan of Care (Signed)
Overall Plan of Care [facility]) Patient Details Name: KALE DOLS MRN: [id number] DOB: February 05, 1946  Diagnosis:  Encephalopathy and deconditioning after cardiogenic shock  Primary Diagnosis:    <principal problem not specified> Co-morbidities: htn, MI, GERD, anxiety  Functional Problem List  Patient demonstrates impairments in the following areas: Balance, Bladder, Bowel, Edema, Endurance, Medication Management, Pain, Safety, Sensory  and Skin Integrity  Basic ADL's: grooming, bathing, dressing and toileting Advanced ADL's: simple meal preparation and light housekeeping  Transfers:  bed mobility, bed to chair, toilet, tub/shower, car and furniture Locomotion:  ambulation and stairs  Additional Impairments:  Communication  expression  Anticipated Outcomes Item Anticipated Outcome  Eating/Swallowing  I  Basic self-care  Mod I  Tolieting  Mod Indepencence  Bowel/Bladder  Mod Independence  Transfers  Mod I; supervision car  Locomotion  Supervision gait x 150' controlled env; 50' home env.  Supervision up/down 4 steps .  Communication  Supervision   Cognition    Pain  Less than 3  Safety/Judgment    Other     Therapy Plan: PT Intensity: Minimum of 1-2 x/day ,45 to 90 minutes PT Frequency: 5 out of 7 days PT Duration Estimated Length of Stay: 5-7 OT Intensity: Minimum of 1-2 x/day, 45 to 90 minutes OT Frequency: 5 out of 7 days OT Duration/Estimated Length of Stay: 7 days SLP Intensity: Minumum of 1-2 x/day, 30 to 90 minutes SLP Frequency: 3 out of 7 days SLP Duration/Estimated Length of Stay: 7-10 days for SLP services    Team Interventions: Item RN PT OT SLP SW TR Other  Self Care/Advanced ADL Retraining  x x      Neuromuscular Re-Education         Therapeutic Activities  x x x     UE/LE Strength Training/ROM  x x      UE/LE Coordination Activities  x       Visual/Perceptual Remediation/Compensation         DME/Adaptive Equipment Instruction  x x        Therapeutic Exercise  x x      Balance/Vestibular Training  x x      Patient/Family Education X x x x     Cognitive Remediation/Compensation         Functional Mobility Training  x x      Ambulation/Gait Training  x       Museum/gallery curator  x       Wheelchair Propulsion/Positioning  x       Surveyor, mining Facilitation    x     Bladder Management X        Bowel Management X        Disease Management/Prevention X        Pain Management X  x      Medication Management X        Skin Care/Wound Management X        Splinting/Orthotics         Discharge Planning X x x x     Psychosocial Support X x x                             Team Discharge Planning: Destination: PT-Home , transition to cardiac rehab,OT-   , SLP-  Projected Follow-up: PT- none; pt states she owns a RW- , OT- None at this time, SLP- none, maybe ENT Projected Equipment Needs: PT- , OT-  , SLP-  Patient/family involved in discharge planning: PT- Patient;Family member/caregiver,  OT- , SLP-   MD ELOS: 7 days Medical Rehab Prognosis:  Excellent Assessment: The patient has been admitted for CIR therapies. The team will be addressing, functional mobility, strength, stamina, balance, safety, adaptive techniques/equipment, self-care, bowel and bladder mgt, patient and caregiver education, ego-support/emotional issues. Goals have been set at mod I to supervision.     See Team Conference Notes for weekly updates to the plan of care

## 2012-11-10 NOTE — Progress Notes (Signed)
Called around 2215 for possible episode of unresponsiveness per bedside RN. Upon arrival to room, patient sitting upright in bed, alert, and oriented. Apparently patient was attempting to get out of bed and developed shortness of breath with some cyanosis per nursing staff. Patient denies chest pain. CBG 151, EKG obtained, VSS. Marissa Nestle PA notified by bedside RN. Will continue to monitor, advised bedside RN to call with further needs

## 2012-11-10 NOTE — Evaluation (Signed)
Occupational Therapy Assessment and Plan  Patient Details  Name: Samantha King MRN: [id number] Date of Birth: 11/22/1945  OT Diagnosis: acute pain and muscle weakness (generalized) Rehab Potential: Rehab Potential: Good ELOS: 7 days   Today's Date: 11/10/2012 Time: 9528-4132 Time Calculation (min): 75 min  Problem List:  Patient Active Problem List  Diagnosis  . Hypertension  . GERD (gastroesophageal reflux disease)  . Acute respiratory failure  . Cardiogenic shock  . Cardiac arrest  . ST elevation myocardial infarction (STEMI) of inferior wall  . H1N1 influenza with pneumonia  . Acute blood loss anemia  . Hypokalemia  . Tobacco abuse  . NSVT (nonsustained ventricular tachycardia)  . Acute paranoia  . Severe muscle deconditioning  . Hyperlipidemia  . Coronary atherosclerosis of native coronary artery    Past Medical History:  Past Medical History  Diagnosis Date  . Hypertension   . MI (myocardial infarction)     STEMI s/p DES-RCA c/b VF arrest and cardiogenic shock  . GERD (gastroesophageal reflux disease)   . Complication of anesthesia   . PONV (postoperative nausea and vomiting)   . Pneumonia 10/2012  . PUD (peptic ulcer disease)   . H1N1 influenza 10/2012  . Cardiac arrest     STEMI with VF arrest x 2   . Hyperlipidemia   . Tobacco abuse    Past Surgical History:  Past Surgical History  Procedure Date  . Tubal ligation   . Coronary angioplasty with stent placement     s/p inferior STEMI c/b VF arrest and cardiogenic shock requiring IABP    Assessment & Plan Clinical Impression: 67 y.o. female smoker with history of HTN, admitted on 12/20 after VF arrest in setting of STEMI. Had < 1 minute of CPR. Was oriented on arrival to ER. Went to cath lab where was found to have occluded RCA. Developed shock and hypoperfusion during case. Intubated. DES placed in RCA. IABP placed. Influenza PCR positive for H1N1 and CXR with LLL CAP. Treated with tamiflu and  unasyn-->changed to Augmentin thru 1/2. VT treated with amiodarone. Fluid overload treated with diuretics and patient extubated on 12/26. She developed heme positive stools treated with BID protonix. She has had confusion with hallucinations treated with Zyprexa. CT head 12/24 without acute changes. Acute PT evaluation done 12/27 and patient noted to be paranoid had difficulty following commands. Mentation slowly improving but she continues with balance and gait deficits.   Patient transferred to CIR on 11/09/2012 .    Patient currently requires min with basic self-care skills and IADL secondary to muscle weakness, decreased cardiorespiratoy endurance and decreased oxygen support and decreased standing balance and decreased balance strategies.  Prior to hospitalization, patient was very independent, driving and also visiting her mom in SNF daily. Patient will benefit from skilled intervention to increase independence with basic self-care skills and increase level of independence with iADL prior to discharge home independently and no further OT follow recommended.  OT Evaluation Precautions/Restrictions  Precautions Precautions: Fall Pain Denies pain except in chest when cough "due to cracked ribs from CPR" Home Living/Prior Functioning Home Living Lives With: Spouse Available Help at Discharge: Family Type of Home: House Home Access: Stairs to enter Secretary/administrator of Steps: 4 Entrance Stairs-Rails: None (son plans to install one) Home Layout: One level (basement but does not use) Bathroom Shower/Tub: Walk-in shower;Door [employer] Toilet: Standard Bathroom Accessibility: Yes How Accessible: Accessible via wheelchair;Accessible via walker Home Adaptive Equipment: Bedside commode/3-in-1;Built-in shower seat;Reacher;Walker - rolling (has these items  from husband's hip surgery) Additional Comments: 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; 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  activities Prior Function Level of Independence: Independent with basic ADLs;Independent with homemaking with ambulation;Independent with gait;Independent with transfers Able to Take Stairs?: Yes Driving: Yes Vocation: Retired (works at home) ADL See FIM for current functional status Vision/Perception  Vision - History Baseline Vision: Wears glasses all the time Patient Visual Report: No change from baseline  Cognition Overall Cognitive Status: Appears within functional limits for tasks assessed (pt & husband report pt is back to baseline) Arousal/Alertness: Awake/alert Orientation Level: Oriented X4 Mobility  Transfers Sit to Stand: 4: Min guard;With upper extremity assist Stand to Sit: 4: Min guard;With upper extremity assist (vcs to ease self down into chair and use armrests)  Balance Static Standing Balance Static Standing - Balance Support: Bilateral upper extremity supported Static Standing - Level of Assistance: 5: Stand by assistance (steady assist for dynamic standing during BADL) Extremity/Trunk Assessment RUE Assessment RUE Assessment: Exceptions to [facility] RUE Strength RUE Overall Strength Comments: AROM WFL, strength unable to fully assess proximally due to chest pain.  4+/5 distally LUE Assessment LUE Assessment: Exceptions to [facility] LUE Strength LUE Overall Strength Comments: AROM WFL, strength unable to fully assess proximally due to chest pain.  4+/5 distally  Refer to Care Plan for Long Term Goals  Recommendations for other services: None  Discharge Criteria: Patient will be discharged from OT if patient refuses treatment 3 consecutive times without medical reason, if treatment goals not met, if there is a change in medical status, if patient makes no progress towards goals or if patient is discharged from hospital.  The above assessment, treatment plan, treatment alternatives and goals were discussed and mutually agreed upon: by patient and by family  Ahna Konkle,  [name] 11/10/2012, 3:27 PM

## 2012-11-10 NOTE — Progress Notes (Signed)
    SUBJECTIVE: No SOB or chest pain  BP 91/60  Pulse 71  Temp 97.7 F (36.5 C) (Axillary)  Resp 19  Wt 166 lb 8 oz (75.524 kg)  SpO2 96%  Intake/Output Summary (Last 24 hours) at 11/10/12 0818 Last data filed at 11/09/12 1859  Gross per 24 hour  Intake    240 ml  Output      2 ml  Net    238 ml    PHYSICAL EXAM General: Well developed, well nourished, in no acute distress. Alert and oriented x 3.  Psych:  Good affect, responds appropriately Neck: No JVD. No masses noted.  Lungs: Clear bilaterally with no wheezes or rhonci noted.  Heart: RRR with no murmurs noted. Abdomen: Bowel sounds are present. Soft, non-tender.  Extremities: No lower extremity edema.   LABS: Basic Metabolic Panel:  Basename 11/09/12 1731 11/09/12 0504 11/08/12 0542  NA -- 140 138  K -- 3.7 3.5  CL -- 107 102  CO2 -- 24 25  GLUCOSE -- 98 118*  BUN -- 18 19  CREATININE 0.60 0.69 --  CALCIUM -- 9.2 9.3  MG -- -- 2.1  PHOS -- -- 2.7   CBC:  Basename 11/09/12 1731 11/08/12 0542  WBC 19.3* 20.6*  NEUTROABS -- --  HGB 11.5* 11.6*  HCT 34.8* 35.8*  MCV 93.5 93.5  PLT 388 443*    Current Meds:    . amoxicillin-clavulanate  1 tablet Oral BID  . antiseptic oral rinse  15 mL Mouth Rinse QID  . aspirin  81 mg Oral Daily  . atorvastatin  80 mg Per NG tube q1800  . enoxaparin  40 mg Subcutaneous Q24H  . levalbuterol  0.63 mg Nebulization Q6H  . lisinopril  5 mg Oral Daily  . metoprolol tartrate  25 mg Oral BID  . nystatin  5 mL Oral QID  . pantoprazole  40 mg Oral Daily  . prasugrel  10 mg Oral Daily  . traZODone  25-50 mg Oral QHS     ASSESSMENT AND PLAN:  1. CAD/STEMI: Pt admitted with inferior STEMI, found to have totally occluded dominant RCA. Developed cardiogenic shock during cath. DES placed x 1 RCA. Initially required IABP but quickly weaned.  Continue ASA and Effient for at least one year. Continue beta blocker, statin, Ace-inh.   2. VF arrest due to ischemic event: No  recurrence since revascularization. Amiodarone has been stopped. Will titrate beta blocker as BP allows. Avoid QT prolonging medicines   3. ICU encephalopathy: Improving and near baseline.   4. Severe deconditioning: Inpatient rehab.    5. Respiratory H1N1 positive with LLL PNA: Continue Augmentin to stop on November 11, 2012. She has completed 10 day course of Tamiflu.      Jarmal Lewelling  1/1/20148:18 AM

## 2012-11-10 NOTE — Progress Notes (Signed)
Subjective/Complaints: No complaints this am. Was hypoxic last night with anxiety spell. Oxygen line got kinked? Has had diarrhea  A 12 point review of systems has been performed and if not noted above is otherwise negative.   Objective: Vital Signs: Blood pressure 100/71, pulse 71, temperature 97.7 F (36.5 C), temperature source Axillary, resp. rate 19, weight 75.524 kg (166 lb 8 oz), SpO2 96.00%. Dg Chest 2 View  11/09/2012  *RADIOLOGY REPORT*  Clinical Data: Evaluate pneumonia.  Status post cardiac arrest.  CHEST - 2 VIEW  Comparison: 11/07/2012  Findings: Normal heart size.  There is a small left effusion, similar to previous exam.  The atelectasis is noted in the left base.  No new findings identified.  No airspace consolidation identified.  IMPRESSION:  1.  Persistent left effusion with overlying plate-like atelectasis.   Original Report Authenticated By: Signa Kell, M.D.     Basename 11/09/12 1731 11/08/12 0542  WBC 19.3* 20.6*  HGB 11.5* 11.6*  HCT 34.8* 35.8*  PLT 388 443*    Basename 11/10/12 0716 11/09/12 1731 11/09/12 0504  NA 142 -- 140  K 3.2* -- 3.7  CL 108 -- 107  GLUCOSE 132* -- 98  BUN 18 -- 18  CREATININE 0.63 0.60 --  CALCIUM 9.0 -- 9.2   CBG (last 3)   Basename 11/09/12 2228  GLUCAP 151*    Wt Readings from Last 3 Encounters:  11/10/12 75.524 kg (166 lb 8 oz)  11/09/12 76 kg (167 lb 8.8 oz)  11/09/12 76 kg (167 lb 8.8 oz)    Physical Exam:  General: Alert and oriented x 3, No apparent distress  HEENT: Head is normocephalic, atraumatic, PERRLA, EOMI, sclera anicteric, oral mucosa pink and moist, dentition intact, ext ear canals clear, speech more intelligible today Neck: Supple without JVD or lymphadenopathy  Heart: Reg rate and rhythm. No murmurs rubs or gallops  Chest: CTA bilaterally without wheezes, rales, or rhonchi; no distress  Abdomen: Soft, non-tender, non-distended, bowel sounds positive.  Extremities: No clubbing, cyanosis, or  edema. Pulses are 2+  Skin: Clean and intact without signs of breakdown  Neuro: Pt is impulsive. No CN deficits. Speech clear. Strength 3-4/5 UE. LE 2+ prox to 4/5 distally. No sensory deficits appreciated. No ataxia. DTR's are 1+. Fair sitting balance.  Musculoskeletal: Full ROM with normal movements., No pain with AROM or PROM in the neck, trunk, or extremities. Posture appropriate  Psych: Pt's affect is appropriate. Pt is cooperative   Assessment/Plan: 1. Functional deficits secondary to deconditioning/encephalopathy after cardiogenic shock which require 3+ hours per day of interdisciplinary therapy in a comprehensive inpatient rehab setting. Physiatrist is providing close team supervision and 24 hour management of active medical problems listed below. Physiatrist and rehab team continue to assess barriers to discharge/monitor patient progress toward functional and medical goals. FIM:                   Comprehension Comprehension Mode: Auditory Comprehension: 5-Follows basic conversation/direction: With extra time/assistive device  Expression Expression Mode: Verbal Expression: 5-Expresses basic 90% of the time/requires cueing < 10% of the time.     Problem Solving Problem Solving: 4-Solves basic 75 - 89% of the time/requires cueing 10 - 24% of the time  Memory Memory: 5-Recognizes or recalls 90% of the time/requires cueing < 10% of the time  Medical Problem List and Plan:  1. DVT Prophylaxis/Anticoagulation: Mechanical: Antiembolism stockings, knee (TED hose) Bilateral lower extremities , lovenox  2. Pain Management: tylenol, tramadol, modalities, stretching  as needed  3. Mood: team to provide ego support as needed. Seems to be generally positive and enthusiastic about her rehab  4. Neuropsych: This patient is capable of making decisions on his/her own behalf.  5. Persistent leukocytosis: continue Augmentin through 1/2. May need antibiotic course broadened if she  spikes a fever.    6. PUD: continue to monitor H/H. Stool guaiac checks X 3. Continue BID protonix.  7. CV: maintain lisinopril for afterload reduction and metoprolol for rate control. Effient per cards as well. Follow for stability of cardiac parameters with increased physical activity.  -continue oxygen as needed. No distress on exam today 8. Diarrhea: stool sent for c diff  -probiotic  LOS (Days) 1 A FACE TO FACE EVALUATION WAS PERFORMED  SWARTZ,ZACHARY T 11/10/2012 9:25 AM

## 2012-11-11 ENCOUNTER — Inpatient Hospital Stay (HOSPITAL_COMMUNITY): Payer: Medicare Other

## 2012-11-11 ENCOUNTER — Inpatient Hospital Stay (HOSPITAL_COMMUNITY): Payer: Medicare Other | Admitting: Speech Pathology

## 2012-11-11 ENCOUNTER — Inpatient Hospital Stay (HOSPITAL_COMMUNITY): Payer: Medicare Other | Admitting: Occupational Therapy

## 2012-11-11 DIAGNOSIS — M6289 Other specified disorders of muscle: Secondary | ICD-10-CM

## 2012-11-11 LAB — CBC WITH DIFFERENTIAL/PLATELET
Basophils Relative: 0 % (ref 0–1)
Eosinophils Absolute: 0.5 10*3/uL (ref 0.0–0.7)
MCH: 30.7 pg (ref 26.0–34.0)
MCHC: 32.6 g/dL (ref 30.0–36.0)
MCV: 94.4 fL (ref 78.0–100.0)
Monocytes Absolute: 2.2 10*3/uL — ABNORMAL HIGH (ref 0.1–1.0)
Monocytes Relative: 12 % (ref 3–12)
Neutrophils Relative %: 71 % (ref 43–77)
RBC: 3.19 MIL/uL — ABNORMAL LOW (ref 3.87–5.11)
WBC: 17.4 10*3/uL — ABNORMAL HIGH (ref 4.0–10.5)

## 2012-11-11 MED ORDER — ALPRAZOLAM 0.5 MG PO TABS: 0.5000 mg | ORAL_TABLET | Freq: Three times a day (TID) | ORAL | Status: DC | PRN

## 2012-11-11 MED ORDER — SACCHAROMYCES BOULARDII 250 MG PO CAPS
500.0000 mg | ORAL_CAPSULE | Freq: Two times a day (BID) | ORAL | Status: DC
  Administered 2012-11-11 – 2012-11-15 (×8): 500 mg via ORAL
  Filled 2012-11-11 (×10): qty 2

## 2012-11-11 MED ORDER — LOPERAMIDE HCL 2 MG PO CAPS
2.0000 mg | ORAL_CAPSULE | Freq: Three times a day (TID) | ORAL | Status: DC
  Administered 2012-11-11 (×2): 2 mg via ORAL
  Filled 2012-11-11 (×7): qty 1

## 2012-11-11 MED ORDER — POTASSIUM CHLORIDE CRYS ER 20 MEQ PO TBCR
20.0000 meq | EXTENDED_RELEASE_TABLET | Freq: Two times a day (BID) | ORAL | Status: DC
  Administered 2012-11-11 (×2): 20 meq via ORAL
  Filled 2012-11-11 (×4): qty 1

## 2012-11-11 MED ORDER — FUROSEMIDE 40 MG PO TABS
40.0000 mg | ORAL_TABLET | Freq: Every day | ORAL | Status: DC
  Administered 2012-11-11 – 2012-11-15 (×5): 40 mg via ORAL
  Filled 2012-11-11 (×6): qty 1

## 2012-11-11 MED ORDER — ALPRAZOLAM 0.5 MG PO TABS
0.5000 mg | ORAL_TABLET | Freq: Three times a day (TID) | ORAL | Status: DC
  Administered 2012-11-11 (×3): 0.5 mg via ORAL
  Filled 2012-11-11 (×3): qty 1

## 2012-11-11 NOTE — Evaluation (Signed)
Physical Therapy Assessment and Plan  Patient Details  Name: Samantha King MRN: [id number] Date of Birth: 08-04-1946  PT Diagnosis: Difficulty walking, Edema and Muscle weakness Rehab Potential: Good ELOS: 5-7   Today's Date: 11/11/2012 Time: 7829-5621 Time Calculation (min): 35 min  Problem List:  Patient Active Problem List  Diagnosis  . Hypertension  . GERD (gastroesophageal reflux disease)  . Acute respiratory failure  . Cardiogenic shock  . Cardiac arrest  . ST elevation myocardial infarction (STEMI) of inferior wall  . H1N1 influenza with pneumonia  . Acute blood loss anemia  . Hypokalemia  . Tobacco abuse  . NSVT (nonsustained ventricular tachycardia)  . Acute paranoia  . Severe muscle deconditioning  . Hyperlipidemia  . Coronary atherosclerosis of native coronary artery    Past Medical History:  Past Medical History  Diagnosis Date  . Hypertension   . MI (myocardial infarction)     STEMI s/p DES-RCA c/b VF arrest and cardiogenic shock  . GERD (gastroesophageal reflux disease)   . Complication of anesthesia   . PONV (postoperative nausea and vomiting)   . Pneumonia 10/2012  . PUD (peptic ulcer disease)   . H1N1 influenza 10/2012  . Cardiac arrest     STEMI with VF arrest x 2   . Hyperlipidemia   . Tobacco abuse    Past Surgical History:  Past Surgical History  Procedure Date  . Tubal ligation   . Coronary angioplasty with stent placement     s/p inferior STEMI c/b VF arrest and cardiogenic shock requiring IABP    Assessment & Plan Clinical Impression: Samantha King is a 67 y.o. female smoker with history of HTN, admitted on 12/20 after VF arrest in setting of STEMI. Had < 1 minute of CPR. Was oriented on arrival to ER. Went to cath lab where was found to have occluded RCA. Developed shock and hypoperfusion during case. Intubated. DES placed in RCA. IABP placed. Influenza PCR positive for H1N1 and CXR with LLL CAP. Treated with tamiflu and  unasyn-->changed to Augmentin thru 1/2. VT treated with amiodarone. Fluid overload treated with diuretics and patient extubated on 12/26.   Patient transferred to CIR on 11/09/2012 .   Patient currently requires mod with mobility secondary to muscle weakness and decreased cardiorespiratoy endurance and decreased oxygen support.  Prior to hospitalization, patient was independent with mobility and lived with Spouse in a House home.  Home access is 4Stairs to enter; no rail, but one is planned.  Patient will benefit from skilled PT intervention to maximize safe functional mobility, minimize fall risk and decrease caregiver burden for planned discharge home with 24 hour supervision.  Anticipate patient will benefit from HHPT at discharge.  PT - End of Session Activity Tolerance: Tolerates < 10 min activity, no significant change in vital signs Endurance Deficit: Yes Endurance Deficit Description: 4L O2; frequent rest breaks  PT Assessment Rehab Potential: Good Barriers to Discharge: None PT Plan PT Intensity: Minimum of 1-2 x/day ,45 to 90 minutes PT Frequency: 5 out of 7 days PT Duration Estimated Length of Stay: 5-7 PT Treatment/Interventions: Ambulation/gait training;Balance/vestibular training;Discharge planning;DME/adaptive equipment instruction;Patient/family education;Functional mobility training;Stair training;Therapeutic Activities;Therapeutic Exercise;UE/LE Strength taining/ROM;UE/LE Coordination activities;Wheelchair propulsion/positioning PT Recommendation Follow Up Recommendations: Home health PT (transition to cardiac rehab when appropriate) Patient destination: Home Equipment Details: pt states she owns a RW  PT Evaluation Precautions/Restrictions- falls   General   Vital SignsTherapy Vitals Temp: 98.1 F (36.7 C) Temp src: Oral Pulse Rate: 87  Resp: 20  BP: 120/75 mmHg Patient Position, if appropriate: Sitting Oxygen Therapy SpO2: 98 % O2 Device: Nasal cannula O2  Flow Rate (L/min): 4 L/min Pulse Oximetry Type: Intermittent Pain Pain Assessment Pain Assessment: 0-10 Pain Score:   5 Pain Type: Acute pain Pain Location: Chest Pain Orientation: Anterior Pain Descriptors: Aching Pain Frequency: Intermittent Pain Onset: Gradual Patients Stated Pain Goal: 3 Pain Intervention(s): Medication (See eMAR);Repositioned Multiple Pain Sites: No Home Living/Prior Functioning Home Living Lives With: Spouse Available Help at Discharge: Family Type of Home: House Home Access: Stairs to enter Secretary/administrator of Steps: 4 Entrance Stairs-Rails: None (son plans to install one) Home Layout: One level (basement but does not use) Bathroom Shower/Tub: Walk-in shower;Door [employer] Toilet: Standard Bathroom Accessibility: Yes How Accessible: Accessible via wheelchair;Accessible via walker Home Adaptive Equipment: Bedside commode/3-in-1;Built-in shower seat;Reacher;Walker - rolling (has these items from husband's hip surgery) Additional Comments: 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; 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 Prior Function Level of Independence: Independent with basic ADLs;Independent with homemaking with ambulation;Independent with gait;Independent with transfers Able to Take Stairs?: Yes Driving: Yes Vocation: Retired (works at home) Vision/Perception - no change from baseline    Cognition Overall Cognitive Status: Appears within functional limits for tasks assessed Arousal/Alertness: Awake/alert Orientation Level: Oriented X4 Sensation Sensation Light Touch: Appears Intact Proprioception: Appears Intact Coordination Gross Motor Movements are Fluid and Coordinated: Yes Heel Shin Test: intact, but limited by pain Motor  Motor Motor: Within Functional Limits  Mobility Bed Mobility Bed Mobility: Rolling Right;Right Sidelying to Sit Rolling Right: 6: Modified independent (Device/Increase time) Right Sidelying to Sit: 6:  Modified independent (Device/Increase time) Transfers Sit to Stand: 4: Min assist Sit to Stand Details: Verbal cues for precautions/safety;Verbal cues for technique Stand to Sit: 4: Min assist Stand to Sit Details (indicate cue type and reason): Verbal cues for precautions/safety;Verbal cues for technique Locomotion  Ambulation Ambulation: Yes Ambulation/Gait Assistance: 3: Mod assist Gait Gait: Yes Gait Pattern: Trunk flexed;Decreased trunk rotation;Decreased hip/knee flexion - left;Decreased hip/knee flexion - right  Trunk/Postural Assessment  Cervical Assessment Cervical Assessment: Within Functional Limits Thoracic Assessment Thoracic Assessment: Exceptions to [facility] (limited by pain, fx'd ribs) Lumbar Assessment Lumbar Assessment: Exceptions to [facility] (limited by pain, fx'd ribs) Postural Control Postural Control: Within Functional Limits  Balance Balance Balance Assessed: Yes Static Standing Balance Static Standing - Balance Support: Bilateral upper extremity supported Static Standing - Level of Assistance: 4: Min assist Static Standing - Comment/# of Minutes: 1, leaning onto sink due to fatigue and weakness due to diarrhea Extremity Assessment      RLE Assessment RLE Assessment: Within Functional Limits (minimal edema LL and foot) LLE Assessment LLE Assessment: Within Functional Limits  FIM:  FIM - Bed/Chair Transfer Bed/Chair Transfer: 4: Chair or W/C > Bed: Min A (steadying Pt. > 75%);4: Bed > Chair or W/C: Min A (steadying Pt. > 75%) FIM - Locomotion: Wheelchair Locomotion: Wheelchair: 2: Travels 50 - 149 ft with moderate assistance (Pt: 50 - 74%) FIM - Locomotion: Ambulation Ambulation/Gait Assistance: 3: Mod assist Locomotion: Ambulation: 1: Travels less than 50 ft with moderate assistance (Pt: 50 - 74%) FIM - Locomotion: Stairs Locomotion: Building control surveyor: Hand rail - 2 Locomotion: Stairs: 2: Up and Down 4 - 11 stairs with minimal assistance (Pt.>75%)    Refer to Care Plan for Long Term Goals  Recommendations for other services: None  Discharge Criteria: Patient will be discharged from PT if patient refuses treatment 3 consecutive times without medical reason, if  treatment goals not met, if there is a change in medical status, if patient makes no progress towards goals or if patient is discharged from hospital.  The above assessment, treatment plan, treatment alternatives and goals were discussed and mutually agreed upon: by patient and by family  Treatment today:  AM- Gait with RW, min assist,  to toilet x 2 in 30 min due to pt's diarrhea.   Min assist sit>< stand, and toilet transfer; clothing management and hygiene with mod assist.  PM- Pt medicated for anxiety, dozing in bed.  Husband in room, stated pt was finally sleeping and had not eaten any lunch.  Pt easily aroused; willing to work bedside.  Therapeutic exercise performed with LE to increase strength for functional mobility: 10 x 1 each - in R sidelying: L hip abduction ; L resisted hip flexion/extension  -in sitting EOB, feet supported: resisted bil hip abduction/adduction, marching, long arc knee extension, calf raises  Pt sat EOB x 10 minutes while eating lunch; discussed future cardiac rehab with pt and husband and reviewed ankle pumps for LE edema control  Husband performed bed> w/c transfer with VCs for w/c management, appropriated hand placement, min assist.    Kabao Leite 11/11/2012, 3:03 PM

## 2012-11-11 NOTE — Progress Notes (Signed)
Subjective/Complaints: Frequent diarrhea still. Anxious at night, affecting sleep  A 12 point review of systems has been performed and if not noted above is otherwise negative.   Objective: Vital Signs: Blood pressure 120/75, pulse 87, temperature 98.1 F (36.7 C), temperature source Oral, resp. rate 20, weight 79.8 kg (175 lb 14.8 oz), SpO2 99.00%. Dg Chest 2 View  11/09/2012  *RADIOLOGY REPORT*  Clinical Data: Evaluate pneumonia.  Status post cardiac arrest.  CHEST - 2 VIEW  Comparison: 11/07/2012  Findings: Normal heart size.  There is a small left effusion, similar to previous exam.  The atelectasis is noted in the left base.  No new findings identified.  No airspace consolidation identified.  IMPRESSION:  1.  Persistent left effusion with overlying plate-like atelectasis.   Original Report Authenticated By: Signa Kell, M.D.     Basename 11/11/12 0630 11/09/12 1731  WBC 17.4* 19.3*  HGB 9.8* 11.5*  HCT 30.1* 34.8*  PLT 385 388    Basename 11/10/12 0716 11/09/12 1731 11/09/12 0504  NA 142 -- 140  K 3.2* -- 3.7  CL 108 -- 107  GLUCOSE 132* -- 98  BUN 18 -- 18  CREATININE 0.63 0.60 --  CALCIUM 9.0 -- 9.2   CBG (last 3)   Basename 11/09/12 2228  GLUCAP 151*    Wt Readings from Last 3 Encounters:  11/11/12 79.8 kg (175 lb 14.8 oz)  11/09/12 76 kg (167 lb 8.8 oz)  11/09/12 76 kg (167 lb 8.8 oz)    Physical Exam:  General: Alert and oriented x 3, No apparent distress  HEENT: Head is normocephalic, atraumatic, PERRLA, EOMI, sclera anicteric, oral mucosa pink and moist, dentition intact, ext ear canals clear, speech more intelligible today Neck: Supple without JVD or lymphadenopathy  Heart: Reg rate and rhythm. No murmurs rubs or gallops  Chest: CTA bilaterally without wheezes, rales, or rhonchi; no distress  Abdomen: Soft, non-tender, non-distended, bowel sounds positive.  Extremities: No clubbing, cyanosis, or edema. Pulses are 2+  Skin: Clean and intact without  signs of breakdown  Neuro: Pt is impulsive. No CN deficits. Speech clear. Strength 3-4/5 UE. LE 2+ prox to 4/5 distally. No sensory deficits appreciated. No ataxia. DTR's are 1+. Fair sitting balance.  Musculoskeletal: Full ROM with normal movements., No pain with AROM or PROM in the neck, trunk, or extremities. Posture appropriate  Psych: Pt's affect is appropriate. Pt is cooperative   Assessment/Plan: 1. Functional deficits secondary to deconditioning/encephalopathy after cardiogenic shock which require 3+ hours per day of interdisciplinary therapy in a comprehensive inpatient rehab setting. Physiatrist is providing close team supervision and 24 hour management of active medical problems listed below. Physiatrist and rehab team continue to assess barriers to discharge/monitor patient progress toward functional and medical goals. FIM: FIM - Bathing Bathing Steps Patient Completed: Chest;Right Arm;Left Arm;Abdomen;Front perineal area;Buttocks;Right upper leg;Left upper leg;Right lower leg (including foot);Left lower leg (including foot) Bathing: 5: Set-up assist to: Obtain items  FIM - Upper Body Dressing/Undressing Upper body dressing/undressing steps patient completed: Thread/unthread right sleeve of pullover shirt/dresss;Thread/unthread left sleeve of pullover shirt/dress;Put head through opening of pull over shirt/dress;Pull shirt over trunk;Thread/unthread left sleeve of front closure shirt/dress;Pull shirt around back of front closure shirt/dress;Button/unbutton shirt Upper body dressing/undressing: 4: Min-Patient completed 75 plus % of tasks FIM - Lower Body Dressing/Undressing Lower body dressing/undressing steps patient completed: Thread/unthread right underwear leg;Thread/unthread left underwear leg;Pull underwear up/down;Thread/unthread right pants leg;Thread/unthread left pants leg;Pull pants up/down;Don/Doff right sock Lower body dressing/undressing: 4: Min-Patient completed 75  plus  % of tasks  FIM - Toileting Toileting: 0: Activity did not occur  FIM - Archivist Transfers: 0-Activity did not occur  FIM - Banker Devices: Walker;Bed rails Bed/Chair Transfer: 4: Bed > Chair or W/C: Min A (steadying Pt. > 75%)  FIM - Locomotion: Ambulation Ambulation/Gait Assistance: 4: Min assist  Comprehension Comprehension Mode: Auditory Comprehension: 6-Follows complex conversation/direction: With extra time/assistive device  Expression Expression Mode: Verbal Expression: 6-Expresses complex ideas: With extra time/assistive device  Social Interaction Social Interaction: 7-Interacts appropriately with others - No medications needed.  Problem Solving Problem Solving: 6-Solves complex problems: With extra time  Memory Memory: 6-More than reasonable amt of time  Medical Problem List and Plan:  1. DVT Prophylaxis/Anticoagulation: Mechanical: Antiembolism stockings, knee (TED hose) Bilateral lower extremities , lovenox  2. Pain Management: tylenol, tramadol, modalities, stretching as needed  3. Mood: team to provide ego support as needed.  -added xanax for periodic anxiety 4. Neuropsych: This patient is capable of making decisions on his/her own behalf.  5. Persistent leukocytosis: continue Augmentin through 1/2. May need antibiotic course broadened if she spikes a fever.    6. PUD: continue to monitor H/H. Stool guaiac checks X 3. Continue BID protonix.  7. CV: maintain lisinopril for afterload reduction and metoprolol for rate control. Effient per cards as well. Follow for stability of cardiac parameters with increased physical activity.  -continue oxygen as needed. No distress on exam today 8. Diarrhea: stool sent for c diff and was negative  -increase probiotic  -schedule imodium  -resend stool for c diff  -likely abx induced--stop augmentin  LOS (Days) 2 A FACE TO FACE EVALUATION WAS  PERFORMED  SWARTZ,ZACHARY T 11/11/2012 8:56 AM

## 2012-11-11 NOTE — Care Management Note (Signed)
[facility] Individual Statement of Services  Patient Name:  Samantha King  Date:  11/11/2012  Welcome to the [facility].  Our goal is to provide you with an individualized program based on your diagnosis and situation, designed to meet your specific needs.  With this comprehensive rehabilitation program, you will be expected to participate in at least 3 hours of rehabilitation therapies Monday-Friday, with modified therapy programming on the weekends.  Your rehabilitation program will include the following services:  Physical Therapy (PT), Occupational Therapy (OT), Speech Therapy (ST), 24 hour per day rehabilitation nursing, Case Management (RN and Social Worker), Rehabilitation Medicine, Nutrition Services and Pharmacy Services  Weekly team conferences will be held on Wednesday to discuss your progress.  Your RN Case Designer, television/film set will talk with you frequently to get your input and to update you on team discussions.  Team conferences with you and your family in attendance may also be held.  Expected length of stay: 7 days  Overall anticipated outcome: mod/i level  Depending on your progress and recovery, your program may change.  Your RN Case Estate agent will coordinate services and will keep you informed of any changes.  Your RN Sports coach and SW names and contact numbers are listed  below.  The following services may also be recommended but are not provided by the [facility]:   Driving Evaluations  Home Health Rehabiltiation Services  Outpatient Rehabilitatation [facility]  Vocational Rehabilitation   Arrangements will be made to provide these services after discharge if needed.  Arrangements include referral to agencies that provide these services.  Your insurance has been verified to be:  Medicare & BCBS Your primary doctor is:  Dr. Wylene Simmer  Pertinent information will be shared with your  doctor and your insurance company.   Social Worker:  Dossie Der, [place] [phone]  Information discussed with and copy given to patient by: Lucy Chris, 11/11/2012, 10:51 AM

## 2012-11-11 NOTE — Progress Notes (Signed)
Occupational Therapy Session Note  Patient Details  Name: LAASIA ARCOS MRN: [id number] Date of Birth: [birth year]  Today's Date: 11/11/2012 Time: 4098-1191 Time Calculation (min): 60 min  Short Term Goals: No short term goals set  Skilled Therapeutic Interventions/Progress Updates:      Pt seen for BADL retraining of toileting, bathing, and dressing with a focus on activity tolerance, standing tolerance, diaphramatic breathing.  Pt was on 3.5 L of oxygen and sats remained at 98% even though patient felt that she was not getting enough oxygen. She was able to tolerate more activity as she focused on her breathing. She tolerated standing for 2-3 minutes at a time. Pt also worked on leg arom from seated in wheelchair to increase mobility for LB dressing.  Therapy Documentation Precautions:  Precautions Precautions: Fall Restrictions Weight Bearing Restrictions: No    Vital Signs: Therapy Vitals Temp: 98.1 F (36.7 C) Temp src: Oral Pulse Rate: 87  Resp: 20  BP: 120/75 mmHg Patient Position, if appropriate: Sitting Oxygen Therapy SpO2: 98 % O2 Device: Nasal cannula O2 Flow Rate (L/min): 4 L/min Pulse Oximetry Type: Intermittent Pain: Pain Assessment Pain Assessment: No/denies pain ADL:  See FIM for current functional status  Therapy/Group: Individual Therapy  Colter Magowan 11/11/2012, 11:31 AM

## 2012-11-11 NOTE — Progress Notes (Signed)
EKG results called to [facility] [place], [place] , no new orders at this time. [place], Danelia Snodgrass Elon Jester

## 2012-11-11 NOTE — Progress Notes (Signed)
Patient working with speech therapist Samantha King, upon returning from bathroom with therapist patient reports feeling flutter in her chest, not feeling well. Discussed with patient feeling anxious, worried about health, gave emotional support, declined offer of antianxiety medication at this point, called Samantha Nestle, [place] made aware of complaint of chest flutter, order received for 12 lead EKG to be done. Awaiting results. Samantha King to be by to see patient. Also, discussed need for contact isolation again a MD ordered another stool to be sent for C diff. Verbalized understanding of plan. Spouse at bedside. Samantha King, Samantha King

## 2012-11-11 NOTE — Evaluation (Signed)
Speech Language Pathology Assessment and Plan  Patient Details  Name: Samantha King MRN: [id number] Date of Birth: 01/08/1946  SLP Diagnosis: Voice disorder  Rehab Potential: Good ELOS: 7-10 days for SLP services   Today's Date: 11/11/2012 Time: 0920-1000 Time Calculation (min): 40 min  Problem List:  Patient Active Problem List  Diagnosis  . Hypertension  . GERD (gastroesophageal reflux disease)  . Acute respiratory failure  . Cardiogenic shock  . Cardiac arrest  . ST elevation myocardial infarction (STEMI) of inferior wall  . H1N1 influenza with pneumonia  . Acute blood loss anemia  . Hypokalemia  . Tobacco abuse  . NSVT (nonsustained ventricular tachycardia)  . Acute paranoia  . Severe muscle deconditioning  . Hyperlipidemia  . Coronary atherosclerosis of native coronary artery   Past Medical History:  Past Medical History  Diagnosis Date  . Hypertension   . MI (myocardial infarction)     STEMI s/p DES-RCA c/b VF arrest and cardiogenic shock  . GERD (gastroesophageal reflux disease)   . Complication of anesthesia   . PONV (postoperative nausea and vomiting)   . Pneumonia 10/2012  . PUD (peptic ulcer disease)   . H1N1 influenza 10/2012  . Cardiac arrest     STEMI with VF arrest x 2   . Hyperlipidemia   . Tobacco abuse    Past Surgical History:  Past Surgical History  Procedure Date  . Tubal ligation   . Coronary angioplasty with stent placement     s/p inferior STEMI c/b VF arrest and cardiogenic shock requiring IABP    Assessment / Plan / Recommendation Clinical Impression  Samantha King is a 67 y.o. female smoker with history of HTN, admitted on 12/20 after VF arrest in setting of STEMI. Had < 1 minute of CPR. Was oriented on arrival to ER. Went to cath lab where was found to have occluded RCA. Developed shock and hypoperfusion during case; Intubated; DES placed in RCA; IABP placed. Influenza PCR positive for H1N1 and CXR with LLL CAP. Treated  with tamiflu and unasyn-->changed to Augmentin thru 1/2. VT treated with amiodarone. Fluid overload treated with diuretics and patient extubated on 12/26. She developed heme positive stools treated with BID protonix. She has had confusion with hallucinations treated with Zyprexa. CT head 12/24 without acute changes. PT evaluation done 12/27 and patient noted to be paranoid had difficulty following commands. Mentation slowly improving but she continues with balance and gait deficits. PT, CM, MD recommending CIR. After consultation, the patient was admitted for CIR therapies on 11/09/12.  Upon evaluation today patient presents with [facility] cognitive-linguistic skills; however, moderately-severe dysphonia is present.  As a result, SLP educated patient regarding vocal hygiene program with recommendation to initiate it immediately.      SLP Assessment  Patient will need skilled Speech Lanaguage Pathology Services during CIR admission    Recommendations  Patient destination: Home Follow up Recommendations: Other (comment) (TBD) Equipment Recommended: None recommended by SLP    SLP Frequency 3 out of 7 days   SLP Treatment/Interventions Cueing hierarchy;Environmental controls;Functional tasks;Internal/external aids;Multimodal communication approach;Patient/family education;Speech/Language facilitation;Therapeutic Activities;Therapeutic Exercise    Pain Pain Assessment Pain Assessment: No/denies pain  Prior Functioning Cognitive/Linguistic Baseline: Within functional limits  Short Term Goals: Week 1: SLP Short Term Goal 1 (Week 1): Patient will demonstrate use of vocal hygiene strategies with min assist verbal cues to self monitor and correct SLP Short Term Goal 2 (Week 1): Patient will demonstrate use of multi-modal communication strategeis with sueprvision level verbal  cues SLP Short Term Goal 3 (Week 1): Patient will perform diaphragmatic breathing exercises with min assist multi-modal cues.  See FIM  for current functional status Refer to Care Plan for Long Term Goals  Recommendations for other services: None  Discharge Criteria: Patient will be discharged from SLP if patient refuses treatment 3 consecutive times without medical reason, if treatment goals not met, if there is a change in medical status, if patient makes no progress towards goals or if patient is discharged from hospital.  The above assessment, treatment plan, treatment alternatives and goals were discussed and mutually agreed upon: by patient  Fae Pippin, M.A., CCC-SLP [phone]  Erian Rosengren 11/11/2012, 2:37 PM

## 2012-11-11 NOTE — Progress Notes (Signed)
Patient Name: Samantha King Date of Encounter: 11/11/2012     Active Problems:  Coronary atherosclerosis of native coronary artery    SUBJECTIVE: Tolerating rehab well. Earlier today she developed anxiety, palpitations, dyspnea and chest discomfort.   OBJECTIVE  Filed Vitals:   11/11/12 0854 11/11/12 0918 11/11/12 1421 11/11/12 1432  BP: 120/75     Pulse: 87     Temp:      TempSrc:      Resp:      Weight:   76.25 kg (168 lb 1.6 oz)   SpO2: 99% 98%  96%    Intake/Output Summary (Last 24 hours) at 11/11/12 1445 Last data filed at 11/11/12 1357  Gross per 24 hour  Intake    720 ml  Output      0 ml  Net    720 ml   Weight change: 4.276 kg (9 lb 6.8 oz)  PHYSICAL EXAM  General: Well developed, well nourished, in no acute distress. Head: Normocephalic, atraumatic, sclera non-icteric, no xanthomas, nares are without discharge.  Neck: Supple without bruits or JVD. Lungs:  Resp regular and unlabored, diffuse fine rales vs upper respiratory strider appreciated, no appreciable rales or rhonchi Heart: RRR no s3, s4, or murmurs. Abdomen: Soft, non-tender, non-distended, BS + x 4.  Msk:  Strength and tone appears normal for age. Extremities:  2+ bilateral pretibial edema. No clubbing or cyanosis. DP/PT/Radials 2+ and equal bilaterally. Neuro: Alert and oriented X 3. Moves all extremities spontaneously. Psych: Normal affect.  LABS:  Recent Labs  Basename 11/11/12 0630 11/09/12 1731   WBC 17.4* 19.3*   HGB 9.8* 11.5*   HCT 30.1* 34.8*   MCV 94.4 93.5   PLT 385 388   Lab 11/10/12 0716 11/09/12 1731 11/09/12 0504 11/08/12 0542  NA 142 -- 140 138  K 3.2* -- 3.7 3.5  CL 108 -- 107 102  CO2 25 -- 24 25  BUN 18 -- 18 19  CREATININE 0.63 0.60 0.69 --  CALCIUM 9.0 -- 9.2 9.3  PROT -- -- -- --  BILITOT -- -- -- --  ALKPHOS -- -- -- --  ALT -- -- -- --  AST -- -- -- --  AMYLASE -- -- -- --  LIPASE -- -- -- --  GLUCOSE 132* -- 98 118*    ECG: NSR, 86 bpm, small  Q waves II, III, aVF, no ST/T changes  Radiology/Studies:  Ct Abdomen Pelvis Wo Contrast  11/01/2012  *RADIOLOGY REPORT*  Clinical Data: Decreased hemoglobin, post cardiac catheterization 10/30/12 with bilateral femoral catheter placement.  CT ABDOMEN AND PELVIS WITHOUT CONTRAST  Technique:  Multidetector CT imaging of the abdomen and pelvis was performed following the standard protocol without intravenous contrast.  Comparison: None.  Findings: Small bilateral pleural effusions with associated compressive atelectasis noted.  Nasogastric tube terminates within the stomach.  Presumed vicarious excretion of contrast into the gallbladder is noted.  There is low density, 5 HU, small amount of fluid in the gallbladder fossa and also small amount of low density pelvic free fluid.  There is a trace amount of fluid tracking superiorly from the right groin along the anterior aspect of the psoas muscle which measures fluid density. Small amount of bilateral flank subcutaneous soft tissue edema or stranding is noted.  Small fat containing left inguinal hernia.  No collection is seen in either inguinal region.  Unenhanced liver, right adrenal gland, kidneys, spleen, and pancreas are unremarkable.  There is a 1.1 cm mass  like lesion in the left adrenal gland measuring 14 HU, compatible with an adenoma. No free air.  No bowel wall thickening or focal segmental dilatation.  Fat containing umbilical hernia noted. Bladder is decompressed with a Foley catheter in place.  Uterus and ovaries are normal.  No acute osseous finding. Disc degenerative changes are noted in the lumbar spine, including 4 mm anterolisthesis of L4 on L5.  IMPRESSION: Trace low density fluid extending from the right groin along the anterior margin of the psoas muscle.  Although it is difficult to obtain accurate Hounsfield unit measurements to determine the composition of the fluid, areas measured are lower than expected for blood products, and if this is a  retroperitoneal hematoma, it is extremely small.  Small amount of fluid around the gallbladder, of unclear etiology. This may be seen with systemic hypoproteinemia, cholecystitis, or occasionally liver disease.  No small amount of subcutaneous edema/stranding.  Small bilateral pleural effusions.  Constellation of findings suggests mild third spacing and possible volume overload.   Original Report Authenticated By: Christiana Pellant, M.D.    Dg Chest 2 View  11/09/2012  *RADIOLOGY REPORT*  Clinical Data: Evaluate pneumonia.  Status post cardiac arrest.  CHEST - 2 VIEW  Comparison: 11/07/2012  Findings: Normal heart size.  There is a small left effusion, similar to previous exam.  The atelectasis is noted in the left base.  No new findings identified.  No airspace consolidation identified.  IMPRESSION:  1.  Persistent left effusion with overlying plate-like atelectasis.   Original Report Authenticated By: Signa Kell, M.D.    Dg Chest Port 1 View  11/07/2012  *RADIOLOGY REPORT*  Clinical Data: Follow up pneumonia  PORTABLE CHEST - 1 VIEW  Comparison: 11/05/2012  Findings: Normal heart size.  Small left pleural effusion and left foot base atelectasis is identified.  This appears similar to previous exam.  No new findings.  IMPRESSION:  1.  Persistent left pleural effusion and left base atelectasis.   Original Report Authenticated By: Signa Kell, M.D.    Dg Chest Port 1 View  11/05/2012  *RADIOLOGY REPORT*  Clinical Data: Short of breath  PORTABLE CHEST - 1 VIEW  Comparison: Yesterday  Findings: Endotracheal and NG tubes removed.  Right internal jugular central venous catheters stable.  Hazy airspace disease within the left mid and lower lung zones has improved.  The minimal atelectasis at the right base.  No pneumothorax.  IMPRESSION: Extubated.  Improved left airspace disease.   Original Report Authenticated By: Jolaine Click, M.D.    Dg Chest Port 1 View  11/04/2012  *RADIOLOGY REPORT*  Clinical Data:  Pulmonary infiltrates.  Check endotracheal tube position.  PORTABLE CHEST - 1 VIEW  Comparison: One-view chest 11/03/2012.  Findings: The endotracheal tube terminates 3.5 cm above the carina, in satisfactory position.  A right IJ line stable.  The NG tube courses off the inferior border of the film.  This left basilar airspace disease is stable.  Mild pulmonary congestion is unchanged.  The visualized soft tissues and bony thorax are unremarkable.  IMPRESSION:  1.  Persistent asymmetric left basilar airspace disease.  This is concerning for pneumonia. 2.  Stable mild pulmonary vascular congestion. 3.  Satisfactory positioning of the endotracheal tube.   Original Report Authenticated By: Marin Roberts, M.D.    Dg Chest Port 1 View  11/03/2012  *RADIOLOGY REPORT*  Clinical Data: Endotracheal tube position, follow-up  PORTABLE CHEST - 1 VIEW  Comparison: Portable chest x-ray of 11/02/2012  Findings: The  tip of the endotracheal tube is approximally 4.2 cm above the carina.  There does appear to be pulmonary vascular congestion present with left basilar atelectasis.  Mild cardiomegaly is stable.  The right central venous line is unchanged in position and an NG tube is noted into the stomach.  IMPRESSION:  1.  Tip of endotracheal tube 4.2 cm above the carina. 2.  Some worsening of pulmonary vascular congestion and basilar atelectasis.   Original Report Authenticated By: Dwyane Dee, M.D.    Dg Chest Port 1 View  11/02/2012  *RADIOLOGY REPORT*  Clinical Data: Respiratory difficulty  PORTABLE CHEST - 1 VIEW  Comparison: Yesterday  Findings: Stable tubular devices.  Vascular congestion improved. Bibasilar airspace disease improved.  No pneumothorax.  IMPRESSION: Improved vascular congestion and bibasilar airspace disease.   Original Report Authenticated By: Jolaine Click, M.D.    Dg Chest Port 1 View  11/01/2012  *RADIOLOGY REPORT*  Clinical Data: Respiratory distress.  PORTABLE CHEST - 1 VIEW  Comparison:  10/31/2012.  Findings: The endotracheal tube, NG tube and right IJ catheters are stable.  The IABP has been removed.  The cardiac silhouette, mediastinal and hilar contours are stable.  There is vascular congestion and slight increase and bibasilar atelectasis.  Probable small pleural effusions.  IMPRESSION:  1.  Stable support apparatus except the IABP has been removed. 2.  Slight increase in vascular congestion and bibasilar atelectasis.   Original Report Authenticated By: Rudie Meyer, M.D.    Dg Chest Port 1 View  10/31/2012  *RADIOLOGY REPORT*  Clinical Data: Cough, congestion, flu  PORTABLE CHEST - 1 VIEW  Comparison: 10/30/2012; 10/29/2012  Findings: Grossly unchanged cardiac silhouette and mediastinal contours.  Stable position of support apparatus including tip of intra-aortic balloon pump approximately 4 cm from the superior aspect of the aortic arch.  Pulmonary vasculature remains indistinct, left greater than right.  Grossly unchanged bibasilar opacities, left greater than right.  No new focal airspace opacity. No definite pleural effusion, though note, the left costophrenic angle is excluded view.  No pneumothorax.  Unchanged bones.  IMPRESSION: 1.  Stable positioning of support apparatus.  No pneumothorax. 2.  Grossly unchanged findings most suggestive of asymmetric pulmonary edema.   Original Report Authenticated By: Tacey Ruiz, MD    Portable Chest Xray In Am  10/30/2012  *RADIOLOGY REPORT*  Clinical Data: Intubation, follow-up  PORTABLE CHEST - 1 VIEW  Comparison: Portable chest x-ray of 10/29/2012  Findings: The carina is difficult to visualize on this rotated patient, but the tip of endotracheal tube appears to be approximately 3.7 cm above the carina.  There is minimal pulmonary vascular congestion present.  Right central venous line tip overlies the mid upper SVC.  No pneumothorax is seen.  The aortic balloon tip overlies the lower aortic knob.  IMPRESSION: Little change in aeration  with mild pulmonary vascular congestion.   Original Report Authenticated By: Dwyane Dee, M.D.    Dg Chest Port 1 View  10/29/2012  *RADIOLOGY REPORT*  Clinical Data: Respiratory difficulty  PORTABLE CHEST - 1 VIEW  Comparison: None.  Findings: Endotracheal tube is 3.9 cm from the carina.  NG tube is beyond the gastroesophageal junction.  Transvenous pacemaker from the IVC in place with its tip projecting over the tip of the right ventricle.  Right internal jugular vein center venous catheter tip in the mid SVC.  Intra-aortic balloon pump tip in the proximal descending aorta.  Low lung volumes.  Normal heart size.  No pneumothorax.  Minimal patchy density at the lung bases.  IMPRESSION: Tubular structures, transvenous pacer and intra-aortic balloon pump appropriately positioned.  Bibasilar atelectasis verses airspace disease.   Original Report Authenticated By: Jolaine Click, M.D.    Ct Portable Head W/o Cm  11/02/2012  *RADIOLOGY REPORT*  Clinical Data: Altered mental status.  CT HEAD WITHOUT CONTRAST  Technique:  Contiguous axial images were obtained from the base of the skull through the vertex without contrast.  Comparison: None.  Findings: No mass lesion, mass effect, midline shift, hydrocephalus, hemorrhage.  No territorial ischemia or acute infarction.  Allowing for portable technique, gray white differentiation appears preserved.  Low attenuation adjacent to the frontal horns of the lateral ventricles is compatible with chronic ischemic disease. Fluid is present in the left mastoid air cells.  IMPRESSION: No acute intracranial abnormality.   Original Report Authenticated By: Andreas Newport, M.D.     Current Medications:     . ALPRAZolam  0.5 mg Oral TID  . antiseptic oral rinse  15 mL Mouth Rinse QID  . aspirin  81 mg Oral Daily  . atorvastatin  80 mg Per NG tube q1800  . enoxaparin  40 mg Subcutaneous Q24H  . levalbuterol  0.63 mg Nebulization Q6H  . lisinopril  5 mg Oral Daily  .  loperamide  2 mg Oral TID  . metoprolol tartrate  25 mg Oral BID  . nystatin  5 mL Oral QID  . pantoprazole  40 mg Oral Daily  . potassium chloride  20 mEq Oral BID  . prasugrel  10 mg Oral Daily  . saccharomyces boulardii  500 mg Oral BID  . traZODone  25-50 mg Oral QHS    ASSESSMENT:  67yo female with PMHx s/f HTN, GERD and recent diagnosis of the flu who presented on 10/29/12 with inferior STEMI attributed to totally occluded RCA s/p PTCA/DES on emergent cath, subsequently complicated by cardiogenic shock and respiratory failure requiring IABP, pressor support and intubation. These were gradually weaned, however her recovery was gradual and limited by ICU encephalopathy (paranoia, delirium), confirmed H1N1 influena with LLL PNA and ongoing diuresis for pulmonary edema. She stabilized further and was transferred to CIR. Cardiac medications have been initiated and adjusted which include ASA/Effient, ACEi, BB, statin.   1. CAD/inferior STEMI 2. VF arrest 3. Cardiogenic shock 4. Acute respiratory failure 5. ICU encephalopathy 6. Severe deconditioning 7. H1N1 positive with LLL PNA 8. Hyperlipidemia 9. Hypertension 10. Anxiety 11. Hypokalemia 12. Diarrhea  - suspect antibiotic-induced 13. Volume overload   DISCUSSION/PLAN:  Remains on O2 via n/c. O2 sat's mid-high 90s. VSS. EKG reveals NSR, IVCD/small Qs II, III, aVF, no ST/T changes. On speaking with the patient, she describes pain as chest soreness. Worse on palpation and deep inspiration. Exacerbated by anxiety. No exertional discomfort or evidence of ischemia or arrhythmia on EKG. She has received a Xanax with improvement. She was afraid to sleep before due to delirium/paranoia, but is sleeping much better now. Suspect anxiety is contributing to her symptoms today, also may be a pleuritic and traumatic component from CPR. There is some evidence of lower extremity edema. I/O and weight slightly up-trending. Would benefit from gentle  diuresis with Lasix PO. Will discuss with MD. Immodium started for diarrhea- C diff negative yesterday, additional culture sent today. She has completed a course of Tamiflu, and today ends a 10-day course of Augmentin. Leukocytosis decreasing. Replete K. Continue anxiolytics. She has gone through quite a lot and suspect rehab will be gradual. She was  praised on her progress.  Signed, R. Hurman Horn, PA-C 11/11/2012, 2:45 PM  I have personally seen and examined this patient with Hurman Horn, PA-C. I agree with the assessment and plan as outlined above. Her chest pain does not appear to be driven by coronary ischemia. Most likely musculoskeletal with recent CPR. Anxiety is clearly a component. Will begin gentle diuresis.   Kazi Reppond 4:15 PM 11/11/2012

## 2012-11-11 NOTE — Progress Notes (Signed)
Social Work Assessment and Plan Social Work Assessment and Plan  Patient Details  Name: Samantha King MRN: [id number] Date of Birth: 1946-01-28  Today's Date: 11/11/2012  Problem List:  Patient Active Problem List  Diagnosis  . Hypertension  . GERD (gastroesophageal reflux disease)  . Acute respiratory failure  . Cardiogenic shock  . Cardiac arrest  . ST elevation myocardial infarction (STEMI) of inferior wall  . H1N1 influenza with pneumonia  . Acute blood loss anemia  . Hypokalemia  . Tobacco abuse  . NSVT (nonsustained ventricular tachycardia)  . Acute paranoia  . Severe muscle deconditioning  . Hyperlipidemia  . Coronary atherosclerosis of native coronary artery   Past Medical History:  Past Medical History  Diagnosis Date  . Hypertension   . MI (myocardial infarction)     STEMI s/p DES-RCA c/b VF arrest and cardiogenic shock  . GERD (gastroesophageal reflux disease)   . Complication of anesthesia   . PONV (postoperative nausea and vomiting)   . Pneumonia 10/2012  . PUD (peptic ulcer disease)   . H1N1 influenza 10/2012  . Cardiac arrest     STEMI with VF arrest x 2   . Hyperlipidemia   . Tobacco abuse    Past Surgical History:  Past Surgical History  Procedure Date  . Tubal ligation   . Coronary angioplasty with stent placement     s/p inferior STEMI c/b VF arrest and cardiogenic shock requiring IABP   Social History:  reports that she quit smoking 6 days ago. Her smoking use included Cigarettes. She has a 20 pack-year smoking history. She quit smokeless tobacco use about a year ago. She reports that she does not drink alcohol or use illicit drugs.  Family / Support Systems Marital Status: Married Patient Roles: Spouse;Parent Spouse/Significant Other: Jake Shark [phone] Children: Jay-son  [phone] Anticipated Caregiver: Husband Ability/Limitations of Caregiver: Husband has no limitations-does work on their cattle farm, but can have others  take over until he can get back to Caregiver Availability: 24/7 Family Dynamics: Close knit family who are there for one another.  Pt reports she is very thankful for her family and their support.  Social History Preferred language: English Religion:  Cultural Background: No issues EducationIndustrial/product designer , realtor courses Read: Yes Write: Yes Employment Status: Retired Fish farm manager Issues: No issues Guardian/Conservator: None-according to MD pt is capable of making her own decisions, but wil also include husband pt pref this also.   Abuse/Neglect Physical Abuse: Denies Verbal Abuse: Denies Sexual Abuse: Denies Exploitation of patient/patient's resources: Denies Self-Neglect: Denies  Emotional Status Pt's affect, behavior adn adjustment status: Pt has had a good day today but became anxious and difficulty breathing.  She is having trouble sleeping and having nightmares, which prevent her from sleeping.  She feels if she can get some rest she will be calmer and less anxious.  Husband is here and very supportive. Recent Psychosocial Issues: Other medical issues-never had any cardiac issues, family memebers have and have not survived. Pyschiatric History: No issues-experiencing anxiety now and trying meds which seem to help and talking about it also.  May benefit from neuro-psych eval prior to discharge.  Not able to perform depression screen due to pt exhausted form therapy and MD to see.  Will continue to monitor Substance Abuse History: History of tobacco-quit last year  Patient / Family Perceptions, Expectations & Goals Pt/Family understanding of illness & functional limitations: Pt and husband have a good understanding of her condition and issues.  She hopes to be weaned off her oxygen and hopes she does not have to go home with it.  Husband is here to assist with her therapy and provide support to pt. Premorbid pt/family roles/activities: Wife, Mother, Retiree, Church  member, Chief Financial Officer, etc Anticipated changes in roles/activities/participation: Resume Pt/family expectations/goals: Pt states: " I just need to sleep and regain my strength."  Husband states: " It has been a whirlwind she is doing great from all she has been [place]: None Premorbid Home Care/DME Agencies: None Transportation available at discharge: Husband Resource referrals recommended: Support group (specify) (Cardiac Support group)  Discharge Planning Living Arrangements: Spouse/significant other Support Systems: Spouse/significant other;Children;Other relatives;Friends/neighbors;Church/faith community Type of Residence: Private residence Insurance Resources: Administrator (specify) Manufacturing systems engineer) Financial Resources: Tree surgeon;Family Support Financial Screen Referred: No Living Expenses: Lives with family Money Management: Patient;Spouse Do you have any problems obtaining your medications?: No Home Management: Pt did PTA Patient/Family Preliminary Plans: Return home with husband who can assist and son is supportive.  She hopes she is mod/i so she will not need someone right there, but may at first to make sure transition is smooth.  Trying to wean off O2 prior to discharge but will see if needed. Social Work Anticipated Follow Up Needs: HH/OP;Support Group  Clinical Impression Pleasant couple, pt is doing well considering all she has gone through in such a short period of time.  Hopefully she will sleep better and then be less anxious. May benefit from Neuro-psych eval if anxiety does not get better.  Lucy Chris 11/11/2012, 3:55 PM

## 2012-11-11 NOTE — Progress Notes (Signed)
Patient information reviewed and entered into eRehab system by Lacara Dunsworth, RN, CRRN, PPS Coordinator.  Information including medical coding and functional independence measure will be reviewed and updated through discharge.     Per nursing patient was given "Data Collection Information Summary for Patients in Inpatient Rehabilitation Facilities with attached "Privacy Act Statement-Health Care Records" upon admission.  

## 2012-11-12 ENCOUNTER — Inpatient Hospital Stay (HOSPITAL_COMMUNITY): Payer: Medicare Other

## 2012-11-12 ENCOUNTER — Inpatient Hospital Stay (HOSPITAL_COMMUNITY): Payer: Medicare Other | Admitting: Speech Pathology

## 2012-11-12 DIAGNOSIS — R5381 Other malaise: Secondary | ICD-10-CM

## 2012-11-12 DIAGNOSIS — F341 Dysthymic disorder: Secondary | ICD-10-CM

## 2012-11-12 DIAGNOSIS — I2510 Atherosclerotic heart disease of native coronary artery without angina pectoris: Secondary | ICD-10-CM

## 2012-11-12 DIAGNOSIS — G931 Anoxic brain damage, not elsewhere classified: Secondary | ICD-10-CM

## 2012-11-12 LAB — BASIC METABOLIC PANEL
CO2: 25 mEq/L (ref 19–32)
Glucose, Bld: 105 mg/dL — ABNORMAL HIGH (ref 70–99)
Potassium: 3.3 mEq/L — ABNORMAL LOW (ref 3.5–5.1)
Sodium: 139 mEq/L (ref 135–145)

## 2012-11-12 MED ORDER — LEVALBUTEROL HCL 0.63 MG/3ML IN NEBU
0.6300 mg | INHALATION_SOLUTION | Freq: Four times a day (QID) | RESPIRATORY_TRACT | Status: DC | PRN
  Filled 2012-11-12: qty 3

## 2012-11-12 MED ORDER — CITALOPRAM HYDROBROMIDE 20 MG PO TABS
20.0000 mg | ORAL_TABLET | Freq: Every day | ORAL | Status: DC
  Administered 2012-11-12 – 2012-11-14 (×3): 20 mg via ORAL
  Filled 2012-11-12 (×4): qty 1

## 2012-11-12 MED ORDER — ALPRAZOLAM 0.25 MG PO TABS
0.2500 mg | ORAL_TABLET | Freq: Three times a day (TID) | ORAL | Status: DC
  Administered 2012-11-12 – 2012-11-15 (×6): 0.25 mg via ORAL
  Filled 2012-11-12 (×7): qty 1

## 2012-11-12 MED ORDER — POTASSIUM CHLORIDE CRYS ER 20 MEQ PO TBCR
30.0000 meq | EXTENDED_RELEASE_TABLET | Freq: Three times a day (TID) | ORAL | Status: DC
  Administered 2012-11-12 – 2012-11-15 (×9): 30 meq via ORAL
  Filled 2012-11-12 (×15): qty 1

## 2012-11-12 MED ORDER — ALPRAZOLAM 0.25 MG PO TABS: 0.2500 mg | ORAL_TABLET | Freq: Three times a day (TID) | ORAL | Status: DC

## 2012-11-12 MED ORDER — LOPERAMIDE HCL 2 MG PO CAPS
2.0000 mg | ORAL_CAPSULE | Freq: Four times a day (QID) | ORAL | Status: DC | PRN
  Filled 2012-11-12: qty 1

## 2012-11-12 NOTE — Progress Notes (Signed)
Subjective/Complaints: Good night of sleep. Anxiety better this am. Stools have stopped  A 12 point review of systems has been performed and if not noted above is otherwise negative.   Objective: Vital Signs: Blood pressure 99/69, pulse 76, temperature 97.6 F (36.4 C), temperature source Oral, resp. rate 20, weight 76.7 kg (169 lb 1.5 oz), SpO2 98.00%. No results found.  Basename 11/11/12 0630 11/09/12 1731  WBC 17.4* 19.3*  HGB 9.8* 11.5*  HCT 30.1* 34.8*  PLT 385 388    Basename 11/12/12 0340 11/10/12 0716  NA 139 142  K 3.3* 3.2*  CL 104 108  GLUCOSE 105* 132*  BUN 9 18  CREATININE 0.62 0.63  CALCIUM 9.2 9.0   CBG (last 3)   Basename 11/09/12 2228  GLUCAP 151*    Wt Readings from Last 3 Encounters:  11/12/12 76.7 kg (169 lb 1.5 oz)  11/09/12 76 kg (167 lb 8.8 oz)  11/09/12 76 kg (167 lb 8.8 oz)    Physical Exam:  General: Alert and oriented x 3, No apparent distress  HEENT: Head is normocephalic, atraumatic, PERRLA, EOMI, sclera anicteric, oral mucosa pink and moist, dentition intact, ext ear canals clear, speech more intelligible today Neck: Supple without JVD or lymphadenopathy  Heart: Reg rate and rhythm. No murmurs rubs or gallops  Chest: CTA bilaterally without wheezes, rales, or rhonchi; no distress  Abdomen: Soft, non-tender, non-distended, bowel sounds positive.  Extremities: No clubbing, cyanosis, or edema. Pulses are 2+  Skin: Clean and intact without signs of breakdown  Neuro: Pt is impulsive. No CN deficits. Speech clear. Strength 3-4/5 UE. LE 2+ prox to 4/5 distally. No sensory deficits appreciated. No ataxia. DTR's are 1+. Fair sitting balance.  Musculoskeletal: Full ROM with normal movements., No pain with AROM or PROM in the neck, trunk, or extremities. Posture appropriate  Psych: Pt's affect is appropriate. Pt is cooperative   Assessment/Plan: 1. Functional deficits secondary to deconditioning/encephalopathy after cardiogenic shock which  require 3+ hours per day of interdisciplinary therapy in a comprehensive inpatient rehab setting. Physiatrist is providing close team supervision and 24 hour management of active medical problems listed below. Physiatrist and rehab team continue to assess barriers to discharge/monitor patient progress toward functional and medical goals. FIM: FIM - Bathing Bathing Steps Patient Completed: Chest;Right Arm;Left Arm;Abdomen;Front perineal area;Buttocks;Right upper leg;Left upper leg;Right lower leg (including foot);Left lower leg (including foot) Bathing: 5: Set-up assist to: Obtain items  FIM - Upper Body Dressing/Undressing Upper body dressing/undressing steps patient completed: Thread/unthread right sleeve of pullover shirt/dresss;Thread/unthread left sleeve of pullover shirt/dress;Put head through opening of pull over shirt/dress;Pull shirt over trunk;Thread/unthread left sleeve of front closure shirt/dress;Pull shirt around back of front closure shirt/dress;Button/unbutton shirt Upper body dressing/undressing: 5: Set-up assist to: Obtain clothing/put away FIM - Lower Body Dressing/Undressing Lower body dressing/undressing steps patient completed: Pull underwear up/down;Pull pants up/down Lower body dressing/undressing: 3: Mod-Patient completed 50-74% of tasks  FIM - Toileting Toileting steps completed by patient: Adjust clothing prior to toileting;Adjust clothing after toileting;Performs perineal hygiene Toileting: 5: Supervision: Safety issues/verbal cues  FIM - Archivist Transfers Assistive Devices: Art gallery manager Transfers: 5-To toilet/BSC: Supervision (verbal cues/safety issues);5-From toilet/BSC: Supervision (verbal cues/safety issues)  FIM - Banker Devices: Walker;Bed rails Bed/Chair Transfer: 4: Chair or W/C > Bed: Min A (steadying Pt. > 75%);4: Bed > Chair or W/C: Min A (steadying Pt. > 75%)  FIM - Locomotion:  Wheelchair Locomotion: Wheelchair: 0: Activity did not occur FIM - Locomotion: Ambulation Locomotion:  Ambulation Assistive Devices: Walker - Rolling Ambulation/Gait Assistance: 4: Min assist Locomotion: Ambulation: 1: Travels less than 50 ft with minimal assistance (Pt.>75%)  Comprehension Comprehension Mode: Auditory Comprehension: 6-Follows complex conversation/direction: With extra time/assistive device  Expression Expression Mode: Verbal Expression: 3-Expresses basic 50 - 74% of the time/requires cueing 25 - 50% of the time. Needs to repeat parts of sentences.  Social Interaction Social Interaction: 6-Interacts appropriately with others with medication or extra time (anti-anxiety, antidepressant).  Problem Solving Problem Solving: 6-Solves complex problems: With extra time  Memory Memory: 6-More than reasonable amt of time  Medical Problem List and Plan:  1. DVT Prophylaxis/Anticoagulation: Mechanical: Antiembolism stockings, knee (TED hose) Bilateral lower extremities , lovenox  2. Pain Management: tylenol, tramadol, modalities, stretching as needed  3. Mood: team to provide ego support as needed.  -added xanax for periodic anxiety- it is now scheduled and seems to be helping 4. Neuropsych: This patient is capable of making decisions on his/her own behalf.  5. Persistent leukocytosis: continue Augmentin through 1/2. May need antibiotic course broadened if she spikes a fever.    6. PUD: continue to monitor H/H.  Continue BID protonix.  7. CV: maintain lisinopril for afterload reduction and metoprolol for rate control. Effient per cards as well. Follow for stability of cardiac parameters with increased physical activity.  -attempt to wean oxygen off  -replace potassium 8. Diarrhea: stool sent for c diff and was negative x2  -increased probiotic  -schedule imodium--change back to prn  -stool has already slowed  -likely abx induced--stopped augmentin.  -  LOS (Days) 3 A  FACE TO FACE EVALUATION WAS PERFORMED  SWARTZ,ZACHARY T 11/12/2012 9:04 AM

## 2012-11-12 NOTE — Progress Notes (Signed)
Pt c/o CP 10/10 sharp, stabbing pain; Denies SOB; B/P=113/77; HR=76: O2 sats 98 on 3L [place]; Dr. Deatra Ina notified; Cardiac enzymes and Troponin level to be drawn stat. Tramadol 50mg  given. Will continue to monitor pt

## 2012-11-12 NOTE — Progress Notes (Signed)
    SUBJECTIVE: Mild chest soreness with deep inspirations. No SOB.   BP 99/69  Pulse 76  Temp 97.6 F (36.4 C) (Oral)  Resp 20  Wt 169 lb 1.5 oz (76.7 kg)  SpO2 98%  Intake/Output Summary (Last 24 hours) at 11/12/12 1610 Last data filed at 11/12/12 0900  Gross per 24 hour  Intake    480 ml  Output      0 ml  Net    480 ml    PHYSICAL EXAM General: Well developed, well nourished, in no acute distress. Alert and oriented x 3.  Psych:  Good affect, responds appropriately Neck: No JVD. No masses noted.  Lungs: Clear bilaterally with no wheezes or rhonci noted.  Heart: RRR with no murmurs noted. Abdomen: Bowel sounds are present. Soft, non-tender.  Extremities: No lower extremity edema.   LABS: Basic Metabolic Panel:  Basename 11/12/12 0340 11/10/12 0716  NA 139 142  K 3.3* 3.2*  CL 104 108  CO2 25 25  GLUCOSE 105* 132*  BUN 9 18  CREATININE 0.62 0.63  CALCIUM 9.2 9.0  MG -- --  PHOS -- --   CBC:  Basename 11/11/12 0630 11/09/12 1731  WBC 17.4* 19.3*  NEUTROABS 12.3* --  HGB 9.8* 11.5*  HCT 30.1* 34.8*  MCV 94.4 93.5  PLT 385 388   Cardiac Enzymes:  Basename 11/12/12 0340  CKTOTAL 30  CKMB 3.8  CKMBINDEX --  TROPONINI <0.30   Current Meds:    . ALPRAZolam  0.25 mg Oral TID  . antiseptic oral rinse  15 mL Mouth Rinse QID  . aspirin  81 mg Oral Daily  . atorvastatin  80 mg Per NG tube q1800  . enoxaparin  40 mg Subcutaneous Q24H  . furosemide  40 mg Oral Daily  . lisinopril  5 mg Oral Daily  . metoprolol tartrate  25 mg Oral BID  . nystatin  5 mL Oral QID  . pantoprazole  40 mg Oral Daily  . potassium chloride  30 mEq Oral TID  . prasugrel  10 mg Oral Daily  . saccharomyces boulardii  500 mg Oral BID     ASSESSMENT AND PLAN:  1. CAD/STEMI: Pt admitted with inferior STEMI, found to have totally occluded dominant RCA. Developed cardiogenic shock during cath. DES placed x 1 RCA. Initially required IABP but quickly weaned. Continue ASA and  Effient for at least one year. Continue beta blocker, statin, Ace-inh. Episode of chest pain yesterday likely related to anxiety and residual pain from chest wall after CPR   2. Severe deconditioning: Continue rehab.   3. Respiratory H1N1 positive with LLL PNA: Augmentin completed November 11, 2012. She has completed 10 day course of Tamiflu.     Dudley Mages  1/3/20149:29 AM

## 2012-11-12 NOTE — Progress Notes (Signed)
Occupational Therapy Session Note  Patient Details  Name: Samantha King MRN: [id number] Date of Birth: 1945-12-22  Today's Date: 11/12/2012 Time: 4098-1191 Time Calculation (min): 60 min  Short Term Goals: Week 1:  OT Short Term Goal 1 (Week 1): STGs=LTGs  Skilled Therapeutic Interventions/Progress Updates:    ADL re-training completed in shower this AM. Session with focus on toileting, functional transfers, ADL performance, standing balance, standing tolerance, and energy conservation. Patient retrieved clothing from closet while using wall as support at times when fatigued. Patient has been having diarrhea this morning and used the toilet twice during ADL. Patient showered and walked out to w/c to dress at sink. Pt would benefit from sock aid, shoe horn, and elastic shoelaces to increase functional performance during LB dressing. Rib pain prevents patient from bending down to feet. Pt educated on energy conservation techniques during session. Oxygen monitored periodically and stayed consistantly in the high 90's.    Therapy Documentation Precautions:  Precautions Precautions: Fall Restrictions Weight Bearing Restrictions: No Pain: Pain Assessment Pain Assessment: No/denies pain  See FIM for current functional status  Therapy/Group: Individual Therapy  Limmie Patricia, OTR/L 11/12/2012, 12:19 PM

## 2012-11-12 NOTE — Progress Notes (Signed)
Events of last pm noted. Patient does not remember having any difficulty. Excited that she was able to sleep through the night and finally had good rest. Back on 4 liters oxygen despite order to wean. O2 98% on RA. Acrylic nails make it difficult to hard to get good reading.

## 2012-11-12 NOTE — Progress Notes (Signed)
Speech Language Pathology Daily Session Note  Patient Details  Name: Samantha King MRN: [id number] Date of Birth: February 05, 1946  Today's Date: 11/12/2012 Time: 0835-0900 Time Calculation (min): 25 min  Short Term Goals: Week 1: SLP Short Term Goal 1 (Week 1): Patient will demonstrate use of vocal hygiene strategies with min assist verbal cues to self monitor and correct SLP Short Term Goal 2 (Week 1): Patient will demonstrate use of multi-modal communication strategeis with sueprvision level verbal cues SLP Short Term Goal 3 (Week 1): Patient will perform diaphragmatic breathing exercises with min assist multi-modal cues.  Skilled Therapeutic Interventions: Skilled treatment session focused on addressing dysphonia.  SLP facilitated session by educating patient on diaphragmatic breathing with supervision level verbal cues; easy onset with use of yawn-sigh with min assist verbal and demonstration cues and continued carryover of vocal rest and hydration.  Patient demonstrated improved quailty of phonation today with increased vocal intensity and min assist verbal cues not to whisper.  SLP reinforced that improved phonation is a result of hydration and vocal rest and despite improvements that she needs to continue to utilite these strategies.    FIM:  Comprehension Comprehension Mode: Auditory Comprehension: 6-Follows complex conversation/direction: With extra time/assistive device Expression Expression Mode: Verbal Expression: 3-Expresses basic 50 - 74% of the time/requires cueing 25 - 50% of the time. Needs to repeat parts of sentences. Social Interaction Social Interaction: 6-Interacts appropriately with others with medication or extra time (anti-anxiety, antidepressant). Problem Solving Problem Solving: 6-Solves complex problems: With extra time Memory Memory: 6-More than reasonable amt of time  Pain Pain Assessment Pain Assessment: No/denies pain  Therapy/Group: Individual  Therapy  Charlane Ferretti., CCC-SLP [phone]  Samantha King 11/12/2012, 10:41 AM

## 2012-11-12 NOTE — Progress Notes (Signed)
Physical Therapy Note  Patient Details  Name: Samantha King MRN: [id number] Date of Birth: 1946/07/30 Today's Date: 11/12/2012  1:00 - 1:45 45 minutes Individual session Patient denies pain.  Treatment focused on increasing activity tolerance. Patient stood at hi low table for 2 minutes while folding towels. Patient stood for 4 minutes at table arranging silk flowers. Patient tapped up and down on 3 inch step 12 times each foot. Patient supervision for all activities but requires frequent breaks during session.   Arelia Longest M 11/12/2012, 3:40 PM

## 2012-11-12 NOTE — Progress Notes (Signed)
Physical Therapy Note  Patient Details  Name: Samantha King MRN: [id number] Date of Birth: 19-Jun-1946 Today's Date: 11/12/2012  11:05 - 12:00 55 minutes Individual session Patient denies pain except she did report some discomfort from broken ribs. This did not affect therapy session.  Patient putting on makeup at sink upon entering room. Patient requested to use bathroom. Patient ambulated with rolling walker into bathroom with supervision. Patient performed toileting tasks independently with grab bar. Patient ambulated 80 feet with rolling walker and supervision. Patient ambulated up and down 3 steps with 1 rail and hand held assist. Patient instructed in [place] A exercises for LE strengthening and to improve balance. Patient required frequent rest breaks. Attempted several times to get O2 sat - only able to get reading once at 93%. Other times unable due to acrylic nails. Also attempted with earlobe and toes with no results. Patient did not use oxygen during therapy session.  Arelia Longest M 11/12/2012, 12:25 PM

## 2012-11-13 ENCOUNTER — Inpatient Hospital Stay (HOSPITAL_COMMUNITY): Payer: Medicare Other | Admitting: Physical Therapy

## 2012-11-13 LAB — BASIC METABOLIC PANEL
CO2: 25 mEq/L (ref 19–32)
Chloride: 105 mEq/L (ref 96–112)
Creatinine, Ser: 0.57 mg/dL (ref 0.50–1.10)
Glucose, Bld: 96 mg/dL (ref 70–99)
Sodium: 141 mEq/L (ref 135–145)

## 2012-11-13 NOTE — Progress Notes (Signed)
Physical Therapy Note  Patient Details  Name: Samantha King MRN: [id number] Date of Birth: 1946/07/03 Today's Date: 11/13/2012  1300-1355 (55 minutes) group Pain: no reported pain Pt participated in PT group session focused on gait training/safety/endurance;transfers with RW SBA with vcs for hand placement and wc setup + assist with legrests; Nustep Level 2 X 10 minutes;  Gait 80 feet X 2 with RW min to SBA . Oxygen sats > 90% RA during session.    Tyray Proch,JIM 11/13/2012, 7:42 AM

## 2012-11-13 NOTE — Progress Notes (Signed)
Subjective:  Slept better, less anxious, not SOB, chest wall soreness improving  Objective:  Vital Signs in the last 24 hours: BP 123/83  Pulse 63  Temp 98.2 F (36.8 C) (Oral)  Resp 18  Wt 72.7 kg (160 lb 4.4 oz)  SpO2 94%  Physical Exam: Pleasant WF in NAD Lungs:  Clear  Cardiac:  Regular rhythm, normal S1 and S2, no S3  Intake/Output from previous day: 01/03 0701 - 01/04 0700 In: 1040 [P.O.:1040] Out: -  Weight Filed Weights   11/11/12 1421 11/12/12 0529 11/13/12 0630  Weight: 76.25 kg (168 lb 1.6 oz) 76.7 kg (169 lb 1.5 oz) 72.7 kg (160 lb 4.4 oz)    Lab Results: Basic Metabolic Panel:  Basename 11/13/12 0632 11/12/12 0340  NA 141 139  K 4.0 3.3*  CL 105 104  CO2 25 25  GLUCOSE 96 105*  BUN 12 9  CREATININE 0.57 0.62    CBC:  Basename 11/11/12 0630  WBC 17.4*  NEUTROABS 12.3*  HGB 9.8*  HCT 30.1*  MCV 94.4  PLT 385    Assessment/Plan:  1. CAD/STEMI: patient stable with no SOB and residual chest wall soreness 2, Severe deconditioning: Continue rehab.    Darden Palmer  MD [facility]  11/13/2012, 11:21 AM

## 2012-11-13 NOTE — Progress Notes (Signed)
Subjective/Complaints: No new complaints Not crazy about hospital food - did not eat most of her breakfast. No n/v Good night of sleep. Anxiety better this am. Stools have stopped  A 12 point review of systems has been performed and if not noted above is otherwise negative.   Objective: Vital Signs: Blood pressure 123/83, pulse 63, temperature 98.2 F (36.8 C), temperature source Oral, resp. rate 18, weight 160 lb 4.4 oz (72.7 kg), SpO2 94.00%. No results found.  Basename 11/11/12 0630  WBC 17.4*  HGB 9.8*  HCT 30.1*  PLT 385    Basename 11/13/12 0632 11/12/12 0340  NA 141 139  K 4.0 3.3*  CL 105 104  GLUCOSE 96 105*  BUN 12 9  CREATININE 0.57 0.62  CALCIUM 9.3 9.2   CBG (last 3)  No results found for this basename: GLUCAP:3 in the last 72 hours  Wt Readings from Last 3 Encounters:  11/13/12 160 lb 4.4 oz (72.7 kg)  11/09/12 167 lb 8.8 oz (76 kg)  11/09/12 167 lb 8.8 oz (76 kg)    Physical Exam:  General: Alert and oriented x 3, No apparent distress  HEENT: Head is normocephalic, atraumatic, PERRLA, EOMI, sclera anicteric, oral mucosa pink and moist, dentition intact, ext ear canals clear, speech more intelligible today Neck: Supple without JVD or lymphadenopathy  Heart: Reg rate and rhythm. No murmurs rubs or gallops  Chest: CTA bilaterally without wheezes, rales, or rhonchi; no distress . Tender ribs, sternum Abdomen: Soft, non-tender, non-distended, bowel sounds positive.  Extremities: No clubbing, cyanosis, or edema. Pulses are 2+  Skin: Clean and intact without signs of breakdown  Neuro: Pt is impulsive. No CN deficits. Speech clear. Strength 3-4/5 UE. LE 2+ prox to 4/5 distally. No sensory deficits appreciated. No ataxia. DTR's are 1+. Fair sitting balance.  Musculoskeletal: Full ROM with normal movements., No pain with AROM or PROM in the neck, trunk, or extremities. Posture appropriate  Psych: Pt's affect is appropriate. Pt is  cooperative   Assessment/Plan: 1. Functional deficits secondary to deconditioning/encephalopathy after cardiogenic shock which require 3+ hours per day of interdisciplinary therapy in a comprehensive inpatient rehab setting. Physiatrist is providing close team supervision and 24 hour management of active medical problems listed below. Physiatrist and rehab team continue to assess barriers to discharge/monitor patient progress toward functional and medical goals. FIM: FIM - Bathing Bathing Steps Patient Completed: Chest;Right Arm;Left Arm;Abdomen;Front perineal area;Buttocks;Right upper leg;Left upper leg;Right lower leg (including foot);Left lower leg (including foot) Bathing: 5: Supervision: Safety issues/verbal cues  FIM - Upper Body Dressing/Undressing Upper body dressing/undressing steps patient completed: Thread/unthread right sleeve of pullover shirt/dresss;Thread/unthread left sleeve of pullover shirt/dress;Put head through opening of pull over shirt/dress;Pull shirt over trunk Upper body dressing/undressing: 5: Supervision: Safety issues/verbal cues FIM - Lower Body Dressing/Undressing Lower body dressing/undressing steps patient completed: Thread/unthread right underwear leg;Thread/unthread left underwear leg;Pull underwear up/down;Thread/unthread right pants leg;Thread/unthread left pants leg;Pull pants up/down Lower body dressing/undressing: 3: Mod-Patient completed 50-74% of tasks  FIM - Toileting Toileting steps completed by patient: Adjust clothing prior to toileting;Performs perineal hygiene;Adjust clothing after toileting Toileting: 6: Assistive device: No helper  FIM - Diplomatic Services operational officer Devices: Grab bars;Walker Toilet Transfers: 5-To toilet/BSC: Supervision (verbal cues/safety issues);5-From toilet/BSC: Supervision (verbal cues/safety issues)  FIM - Banker Devices: Walker;Bed rails Bed/Chair Transfer: 4:  Chair or W/C > Bed: Min A (steadying Pt. > 75%);4: Bed > Chair or W/C: Min A (steadying Pt. > 75%)  FIM -  Locomotion: Wheelchair Locomotion: Wheelchair: 0: Activity did not occur FIM - Locomotion: Ambulation Locomotion: Ambulation Assistive Devices: Designer, industrial/product Ambulation/Gait Assistance: 5: Supervision Locomotion: Ambulation: 2: Travels 50 - 149 ft with supervision/safety issues  Comprehension Comprehension Mode: Auditory Comprehension: 6-Follows complex conversation/direction: With extra time/assistive device  Expression Expression Mode: Verbal Expression: 3-Expresses basic 50 - 74% of the time/requires cueing 25 - 50% of the time. Needs to repeat parts of sentences.  Social Interaction Social Interaction: 6-Interacts appropriately with others with medication or extra time (anti-anxiety, antidepressant).  Problem Solving Problem Solving: 6-Solves complex problems: With extra time  Memory Memory: 6-More than reasonable amt of time  Medical Problem List and Plan:  1. DVT Prophylaxis/Anticoagulation: Mechanical: Antiembolism stockings, knee (TED hose) Bilateral lower extremities , lovenox  2. Pain Management: tylenol, tramadol, modalities, stretching as needed  3. Mood: team to provide ego support as needed.  -added xanax for periodic anxiety- it is now scheduled and seems to be helping 4. Neuropsych: This patient is capable of making decisions on his/her own behalf.  5. Persistent leukocytosis: continue Augmentin through 1/2. May need antibiotic course broadened if she spikes a fever.    6. PUD: continue to monitor H/H.  Continue BID protonix.  7. CV: maintain lisinopril for afterload reduction and metoprolol for rate control. Effient per cards as well. Follow for stability of cardiac parameters with increased physical activity. Cont Rx  -attempt to wean oxygen off  -replace potassium 8. Diarrhea: stool sent for c diff and was negative x2  -increased probiotic  -schedule  imodium--change back to prn  -stool has already slowed  -likely abx induced--stopped augmentin. 9. STEMI. On rx 10. Chest wall pain - post-CPR. Discussed  -  LOS (Days) 4 A FACE TO FACE EVALUATION WAS PERFORMED  Alex Plotnikov 11/13/2012 9:42 AM

## 2012-11-14 ENCOUNTER — Encounter (HOSPITAL_COMMUNITY): Payer: Medicare Other | Admitting: Occupational Therapy

## 2012-11-14 ENCOUNTER — Inpatient Hospital Stay (HOSPITAL_COMMUNITY): Payer: Medicare Other | Admitting: *Deleted

## 2012-11-14 NOTE — Progress Notes (Signed)
Physical Therapy Note  Patient Details  Name: Samantha King MRN: [id number] Date of Birth: 11-Dec-1945 Today's Date: 11/14/2012  9:00 - 9:55 55 minutes Individual session Patient denies pain.  Treatment focused on functional mobility to increase activity tolerance and BERG testing for balance. Patient ambulated to apartment and performed supine to and from sitting without assistance on regular double bed. Patient transferred to sofa and kitchen chair without arms with supervision. Patient ambulated to gym for BERG testing. Patient scored 49/56 on BERG making her moderate risk for falling and encouraged to use rolling walker for now. Patient required frequent rest breaks during testing. Patient able to safely maneuver rolling walker around and over obstacles with supervision. Patient up and down 3 steps with 1 rail and hand held assist. Patient ambulated 150 feet with rolling walker and supervision back to room.   Arelia Longest M 11/14/2012, 10:19 AM

## 2012-11-14 NOTE — Progress Notes (Signed)
Occupational Therapy Session Note  Patient Details  Name: Samantha King MRN: [id number] Date of Birth: 1946/01/06  Today's Date: 11/14/2012 Time: 0751-0822 Time Calculation (min): 31 min  Skilled Therapeutic Interventions/Progress Updates: Initially refused treatment upon approach as patient stated she needed to rest and had not had much sleep since she has been in the hospital.  When to bathe "underarms and bottom,"  She concurred and stated, "I guess I should do those areas, but I am going to lay back down and rest after that."  After approx 20 minutes into the session, she stated her throat hurt and that she would rest her voice and throat for the rest of the morning. After bathing and dressing, she asked this clinician to help her walk to the scale outside her door so that she could weigh herself.   Patient required a couple rest breaks during the session, and her voice grew low and whispery by the end of the session.          Therapy Documentation Precautions:  Precautions Precautions: Fall Restrictions Weight Bearing Restrictions: No General: General Amount of Missed OT Time (min): 29 Minutes (29 min)  Pain:denied Pain Assessment Pain Assessment: No/denies pain  See FIM for current functional status  Therapy/Group: Individual Therapy  Bud Face [facility] 11/14/2012, 5:01 PM

## 2012-11-14 NOTE — Progress Notes (Signed)
Subjective/Complaints: No new complaints this am Not crazy about hospital food - did not eat most of her breakfast. No n/v Good night of sleep. Anxiety better this am. Stools have stopped  A 12 point review of systems has been performed and if not noted above is otherwise negative.   Objective: Vital Signs: Blood pressure 110/71, pulse 71, temperature 98.1 F (36.7 C), temperature source Oral, resp. rate 17, weight 156 lb 15.5 oz (71.2 kg), SpO2 95.00%. No results found. No results found for this basename: WBC:2,HGB:2,HCT:2,PLT:2 in the last 72 hours  Basename 11/13/12 0632 11/12/12 0340  NA 141 139  K 4.0 3.3*  CL 105 104  GLUCOSE 96 105*  BUN 12 9  CREATININE 0.57 0.62  CALCIUM 9.3 9.2   CBG (last 3)  No results found for this basename: GLUCAP:3 in the last 72 hours  Wt Readings from Last 3 Encounters:  11/14/12 156 lb 15.5 oz (71.2 kg)  11/09/12 167 lb 8.8 oz (76 kg)  11/09/12 167 lb 8.8 oz (76 kg)    Physical Exam:  General: Alert and oriented x 3, No apparent distress  HEENT: Head is normocephalic, atraumatic, PERRLA, EOMI, sclera anicteric, oral mucosa pink and moist, dentition intact, ext ear canals clear, speech more intelligible today Neck: Supple without JVD or lymphadenopathy  Heart: Reg rate and rhythm. No murmurs rubs or gallops  Chest: CTA bilaterally without wheezes, rales, or rhonchi; no distress . Tender ribs, sternum Abdomen: Soft, non-tender, non-distended, bowel sounds positive.  Extremities: No clubbing, cyanosis, or edema. Pulses are 2+  Skin: Clean and intact without signs of breakdown  Neuro: Pt is impulsive. No CN deficits. Speech clear. Strength 3-4/5 UE. LE 2+ prox to 4/5 distally. No sensory deficits appreciated. No ataxia. DTR's are 1+. Fair sitting balance.  Musculoskeletal: Full ROM with normal movements., No pain with AROM or PROM in the neck, trunk, or extremities. Posture appropriate  Psych: Pt's affect is appropriate. Pt is  cooperative   Assessment/Plan: 1. Functional deficits secondary to deconditioning/encephalopathy after cardiogenic shock which require 3+ hours per day of interdisciplinary therapy in a comprehensive inpatient rehab setting. Physiatrist is providing close team supervision and 24 hour management of active medical problems listed below. Physiatrist and rehab team continue to assess barriers to discharge/monitor patient progress toward functional and medical goals. FIM: FIM - Bathing Bathing Steps Patient Completed: Chest;Right Arm;Left Arm;Abdomen;Front perineal area;Buttocks;Right upper leg;Left upper leg;Right lower leg (including foot);Left lower leg (including foot) Bathing: 5: Supervision: Safety issues/verbal cues  FIM - Upper Body Dressing/Undressing Upper body dressing/undressing steps patient completed: Thread/unthread right sleeve of pullover shirt/dresss;Thread/unthread left sleeve of pullover shirt/dress;Put head through opening of pull over shirt/dress;Pull shirt over trunk Upper body dressing/undressing: 5: Supervision: Safety issues/verbal cues FIM - Lower Body Dressing/Undressing Lower body dressing/undressing steps patient completed: Thread/unthread right underwear leg;Thread/unthread left underwear leg;Pull underwear up/down;Thread/unthread right pants leg;Thread/unthread left pants leg;Pull pants up/down Lower body dressing/undressing: 3: Mod-Patient completed 50-74% of tasks  FIM - Toileting Toileting steps completed by patient: Adjust clothing prior to toileting;Performs perineal hygiene;Adjust clothing after toileting Toileting Assistive Devices: Grab bar or rail for support Toileting: 6: Assistive device: No helper  FIM - Diplomatic Services operational officer Devices: Grab bars;Walker Toilet Transfers: 5-To toilet/BSC: Supervision (verbal cues/safety issues);5-From toilet/BSC: Supervision (verbal cues/safety issues)  FIM - Landscape architect Devices: Walker;Bed rails Bed/Chair Transfer: 4: Chair or W/C > Bed: Min A (steadying Pt. > 75%);4: Bed > Chair or W/C: Min A (steadying Pt. >  75%)  FIM - Locomotion: Wheelchair Locomotion: Wheelchair: 0: Activity did not occur FIM - Locomotion: Ambulation Locomotion: Ambulation Assistive Devices: Designer, industrial/product Ambulation/Gait Assistance: 5: Supervision Locomotion: Ambulation: 2: Travels 50 - 149 ft with supervision/safety issues  Comprehension Comprehension Mode: Auditory Comprehension: 6-Follows complex conversation/direction: With extra time/assistive device  Expression Expression Mode: Verbal Expression: 5-Expresses basic needs/ideas: With no assist  Social Interaction Social Interaction: 6-Interacts appropriately with others with medication or extra time (anti-anxiety, antidepressant).  Problem Solving Problem Solving: 6-Solves complex problems: With extra time  Memory Memory: 6-More than reasonable amt of time  Medical Problem List and Plan:  1. DVT Prophylaxis/Anticoagulation: Mechanical: Antiembolism stockings, knee (TED hose) Bilateral lower extremities , lovenox  2. Pain Management: tylenol, tramadol, modalities, stretching as needed  3. Mood: team to provide ego support as needed.  -added xanax for periodic anxiety- it is now scheduled and seems to be helping 4. Neuropsych: This patient is capable of making decisions on his/her own behalf.  5. Persistent leukocytosis: continue Augmentin through 1/2. May need antibiotic course broadened if she spikes a fever.    6. PUD: continue to monitor H/H.  Continue BID protonix.  7. CV: maintain lisinopril for afterload reduction and metoprolol for rate control. Effient per cards as well. Follow for stability of cardiac parameters with increased physical activity. Cont Rx  -attempt to wean oxygen off  -replace potassium 8. Diarrhea - better: stool sent for c diff and was negative x2  -increased  probiotic  -schedule imodium--change back to prn  -stool has already slowed  -likely abx induced--stopped augmentin. 9. STEMI. On rx 10. Chest wall pain - post-CPR. PRN meds  -  LOS (Days) 5 A FACE TO FACE EVALUATION WAS PERFORMED  Alex Plotnikov 11/14/2012 9:17 AM

## 2012-11-14 NOTE — Progress Notes (Signed)
Subjective:  Slept well, NO chest wall soreness today.  Objective:  Vital Signs in the last 24 hours: BP 110/71  Pulse 71  Temp 98.1 F (36.7 C) (Oral)  Resp 17  Wt 71.2 kg (156 lb 15.5 oz)  SpO2 95%  Physical Exam: Pleasant WF in NAD Lungs:  Clear  Cardiac:  Regular rhythm, normal S1 and S2, no S3  Intake/Output from previous day: 01/04 0701 - 01/05 0700 In: 840 [P.O.:840] Out: -  Weight Filed Weights   11/12/12 0529 11/13/12 0630 11/14/12 0500  Weight: 76.7 kg (169 lb 1.5 oz) 72.7 kg (160 lb 4.4 oz) 71.2 kg (156 lb 15.5 oz)    Lab Results: Basic Metabolic Panel:  Basename 11/13/12 0632 11/12/12 0340  NA 141 139  K 4.0 3.3*  CL 105 104  CO2 25 25  GLUCOSE 96 105*  BUN 12 9  CREATININE 0.57 0.62    CBC: No results found for this basename: WBC:2,NEUTROABS:2,HGB:2,HCT:2,MCV:2,PLT:2 in the last 72 hours  Assessment/Plan:  1. CAD/STEMI: patient stable with no SOB and residual chest wall soreness 2, Severe deconditioning: Continue rehab.    Darden Palmer  MD [facility]  11/14/2012, 10:36 AM

## 2012-11-14 NOTE — Progress Notes (Signed)
Physical Therapy Note  Patient Details  Name: Samantha King MRN: [id number] Date of Birth: [birth year] Today's Date: 11/14/2012  1:50 - 3:00 70 minutes Individual session Patient denies pain.  Treatment consisted of increasing activity tolerance, increasing independence with functional mobility and family education. Patient ambulated 150+ feet with rolling walker and supervision. Patient transferred into and out of car with supervision. Patient used Wii bowling in standing to work on endurance. Patient stood 7 1/2 minutes x 1 and 6 minutes x 1 during the activity. Patient ambulated without assistive device 40' x 1 and 60' x 1 with no LOB and close supervision. Worked on changing speeds with ambulation, starts, stops, changing directions, backwards walking, and head turns to right, left, and up and down while walking. Patient ambulated up and down 4 steps with husband's minimal hand held assist. Discussed with patient and husband plan for cardiac rehab follow-up to increase exercise gradually. Also discussed energy conservation for house hold tasks and activities of daily living. Discussed removing throw rugs and other mobility issues in home setting.      Arelia Longest M 11/14/2012, 3:07 PM

## 2012-11-15 ENCOUNTER — Inpatient Hospital Stay (HOSPITAL_COMMUNITY): Payer: Medicare Other | Admitting: Occupational Therapy

## 2012-11-15 ENCOUNTER — Inpatient Hospital Stay (HOSPITAL_COMMUNITY): Payer: Medicare Other

## 2012-11-15 ENCOUNTER — Encounter (HOSPITAL_COMMUNITY): Payer: Medicare Other

## 2012-11-15 ENCOUNTER — Inpatient Hospital Stay (HOSPITAL_COMMUNITY): Payer: Medicare Other | Admitting: Speech Pathology

## 2012-11-15 DIAGNOSIS — R5381 Other malaise: Secondary | ICD-10-CM

## 2012-11-15 DIAGNOSIS — I2510 Atherosclerotic heart disease of native coronary artery without angina pectoris: Secondary | ICD-10-CM

## 2012-11-15 DIAGNOSIS — F341 Dysthymic disorder: Secondary | ICD-10-CM

## 2012-11-15 DIAGNOSIS — G931 Anoxic brain damage, not elsewhere classified: Secondary | ICD-10-CM

## 2012-11-15 LAB — CBC
Hemoglobin: 11.7 g/dL — ABNORMAL LOW (ref 12.0–15.0)
MCH: 30.9 pg (ref 26.0–34.0)
MCHC: 32.8 g/dL (ref 30.0–36.0)
MCV: 94.2 fL (ref 78.0–100.0)
RBC: 3.79 MIL/uL — ABNORMAL LOW (ref 3.87–5.11)

## 2012-11-15 LAB — BASIC METABOLIC PANEL
BUN: 10 mg/dL (ref 6–23)
CO2: 26 mEq/L (ref 19–32)
Glucose, Bld: 106 mg/dL — ABNORMAL HIGH (ref 70–99)
Potassium: 5 mEq/L (ref 3.5–5.1)
Sodium: 138 mEq/L (ref 135–145)

## 2012-11-15 MED ORDER — NITROGLYCERIN 0.4 MG SL SUBL: 0.4000 mg | SUBLINGUAL_TABLET | SUBLINGUAL | Status: DC | PRN

## 2012-11-15 MED ORDER — METOPROLOL TARTRATE 25 MG PO TABS: 25.0000 mg | ORAL_TABLET | Freq: Two times a day (BID) | ORAL | Status: DC

## 2012-11-15 MED ORDER — LISINOPRIL 5 MG PO TABS: 5.0000 mg | ORAL_TABLET | Freq: Every day | ORAL | Status: DC

## 2012-11-15 MED ORDER — ATORVASTATIN CALCIUM 80 MG PO TABS: 80.0000 mg | ORAL_TABLET | Freq: Every day | ORAL | Status: DC

## 2012-11-15 MED ORDER — PRASUGREL HCL 10 MG PO TABS: 10.0000 mg | ORAL_TABLET | Freq: Every day | ORAL | Status: DC

## 2012-11-15 MED ORDER — TRAMADOL HCL 50 MG PO TABS: 50.0000 mg | ORAL_TABLET | Freq: Four times a day (QID) | ORAL | Status: DC | PRN

## 2012-11-15 MED ORDER — FUROSEMIDE 40 MG PO TABS: 40.0000 mg | ORAL_TABLET | Freq: Every day | ORAL | Status: DC

## 2012-11-15 MED ORDER — POTASSIUM CHLORIDE CRYS ER 10 MEQ PO TBCR: 10.0000 meq | EXTENDED_RELEASE_TABLET | Freq: Every day | ORAL | Status: DC

## 2012-11-15 MED ORDER — POTASSIUM CHLORIDE CRYS ER 10 MEQ PO TBCR
10.0000 meq | EXTENDED_RELEASE_TABLET | Freq: Every day | ORAL | Status: DC
  Filled 2012-11-15: qty 1

## 2012-11-15 MED ORDER — ALPRAZOLAM 0.25 MG PO TABS: 0.2500 mg | ORAL_TABLET | Freq: Three times a day (TID) | ORAL | Status: DC | PRN

## 2012-11-15 NOTE — Progress Notes (Signed)
Occupational Therapy Discharge Summary  Patient Details  Name: Samantha King MRN: [id number] Date of Birth: [birth year]  Today's Date: 11/15/2012  Patient has met 10 of 10 long term goals due to improved activity tolerance and improved balance.  Patient to discharge at overall Modified Independent level.  Patient's care partner is independent to provide the necessary physical assistance at discharge.    Reasons goals not met: n/a  Recommendation:  No further OT services required. Pt will need cardiac outpatient therapy.  Equipment: long handled sponge  Reasons for discharge: treatment goals met  Patient/family agrees with progress made and goals achieved: Yes  OT Discharge Precautions/Restrictions  Restrictions Weight Bearing Restrictions: No   Pain Pain Assessment Pain Assessment: No/denies pain Pain Score:   4 Pain Type: Acute pain Pain Location: Rib cage Pain Descriptors: Aching Pain Intervention(s): Medication (See eMAR) ADL  Mod I Vision/Perception  Vision - History Baseline Vision: Wears glasses all the time Patient Visual Report: No change from baseline Vision - Assessment Eye Alignment: Within Functional Limits Perception Perception: Within Functional Limits Praxis Praxis: Intact  Cognition Overall Cognitive Status: Appears within functional limits for tasks assessed Orientation Level: Oriented X4 Sensation Sensation Light Touch: Appears Intact Stereognosis: Appears Intact Hot/Cold: Appears Intact Proprioception: Appears Intact Coordination Gross Motor Movements are Fluid and Coordinated: Yes Fine Motor Movements are Fluid and Coordinated: Yes Motor  Motor Motor: Within Functional Limits Mobility    Mod I with RW Trunk/Postural Assessment  Cervical Assessment Cervical Assessment: Within Functional Limits Thoracic Assessment Thoracic Assessment: Within Functional Limits Lumbar Assessment Lumbar Assessment: Within Functional  Limits Postural Control Postural Control: Within Functional Limits  Balance Static Standing Balance Static Standing - Level of Assistance: 6: Modified independent (Device/Increase time) Extremity/Trunk Assessment RUE Assessment RUE Assessment: Within Functional Limits LUE Assessment LUE Assessment: Within Functional Limits  See FIM for current functional status  SAGUIER,JULIA 11/15/2012, 11:43 AM

## 2012-11-15 NOTE — Progress Notes (Signed)
Social Work Discharge Note Discharge Note  The overall goal for the admission was met for:   Discharge location: Yes-HOME WITH HUSBAND AND SON   Length of Stay: Yes-6 DAYS  Discharge activity level: Yes-MOD/I-SUPERVISION LEVEL  Home/community participation: Yes  Services provided included: MD, RD, PT, OT, SLP, RN, Pharmacy and SW  Financial Services: Medicare and Private Insurance: BCBS  Follow-up services arranged: Home Health: [facility]-PT, RN and Patient/Family has no preference for HH/DME agencies  Comments (or additional information):PT TO TRANSITION TO CARDIAC REHAB AT [facility] ONCE DISCHARGED FROM HOME HEALTH REFERRAL MADE ON ACUTE FOR CARDIAC REHAB AT [facility]  Patient/Family verbalized understanding of follow-up arrangements: Yes  Individual responsible for coordination of the follow-up plan: SELF & HAROLD-HUSBAND  Confirmed correct DME delivered: Lucy Chris 11/15/2012    Lucy Chris

## 2012-11-15 NOTE — Progress Notes (Signed)
Subjective/Complaints: No problems over the weekend. Feels ready to go home  A 12 point review of systems has been performed and if not noted above is otherwise negative.   Objective: Vital Signs: Blood pressure 103/71, pulse 70, temperature 97.8 F (36.6 C), temperature source Oral, resp. rate 19, weight 72.3 kg (159 lb 6.3 oz), SpO2 94.00%. No results found. No results found for this basename: WBC:2,HGB:2,HCT:2,PLT:2 in the last 72 hours  Basename 11/13/12 0632  NA 141  K 4.0  CL 105  GLUCOSE 96  BUN 12  CREATININE 0.57  CALCIUM 9.3   CBG (last 3)  No results found for this basename: GLUCAP:3 in the last 72 hours  Wt Readings from Last 3 Encounters:  11/15/12 72.3 kg (159 lb 6.3 oz)  11/09/12 76 kg (167 lb 8.8 oz)  11/09/12 76 kg (167 lb 8.8 oz)    Physical Exam:  General: Alert and oriented x 3, No apparent distress  HEENT: Head is normocephalic, atraumatic, PERRLA, EOMI, sclera anicteric, oral mucosa pink and moist, dentition intact, ext ear canals clear, speech more intelligible today Neck: Supple without JVD or lymphadenopathy  Heart: Reg rate and rhythm. No murmurs rubs or gallops  Chest: CTA bilaterally without wheezes, rales, or rhonchi; no distress  Abdomen: Soft, non-tender, non-distended, bowel sounds positive.  Extremities: No clubbing, cyanosis, or edema. Pulses are 2+  Skin: Clean and intact without signs of breakdown  Neuro: Pt is impulsive. No CN deficits. Speech clear. Strength 3-4/5 UE. LE 2+ prox to 4/5 distally. No sensory deficits appreciated. No ataxia. DTR's are 1+. Fair sitting balance.  Musculoskeletal: Full ROM with normal movements., No pain with AROM or PROM in the neck, trunk, or extremities. Posture appropriate  Psych: Pt's affect is appropriate. Pt is cooperative. Good spirits.   Assessment/Plan: 1. Functional deficits secondary to deconditioning/encephalopathy after cardiogenic shock which require 3+ hours per day of interdisciplinary  therapy in a comprehensive inpatient rehab setting. Physiatrist is providing close team supervision and 24 hour management of active medical problems listed below. Physiatrist and rehab team continue to assess barriers to discharge/monitor patient progress toward functional and medical goals.  Can go home today with home health follow up.   FIM: FIM - Bathing Bathing Steps Patient Completed: Right Arm;Left Arm;Front perineal area;Buttocks (patient elected to only wash underarms and bottom today ) Bathing: 5: Supervision: Safety issues/verbal cues  FIM - Upper Body Dressing/Undressing Upper body dressing/undressing steps patient completed: Thread/unthread right sleeve of pullover shirt/dresss;Thread/unthread left sleeve of pullover shirt/dress;Put head through opening of pull over shirt/dress;Pull shirt over trunk Upper body dressing/undressing: 5: Supervision: Safety issues/verbal cues FIM - Lower Body Dressing/Undressing Lower body dressing/undressing steps patient completed: Thread/unthread right underwear leg;Thread/unthread left underwear leg;Pull underwear up/down;Thread/unthread right pants leg;Thread/unthread left pants leg;Pull pants up/down;Fasten/unfasten pants (did not change socks and did not don shoes today) Lower body dressing/undressing: 5: Supervision: Safety issues/verbal cues  FIM - Toileting Toileting steps completed by patient: Adjust clothing prior to toileting;Performs perineal hygiene;Adjust clothing after toileting Toileting Assistive Devices: Grab bar or rail for support Toileting: 6: Assistive device: No helper  FIM - Diplomatic Services operational officer Devices: Grab bars;Walker Toilet Transfers: 5-To toilet/BSC: Supervision (verbal cues/safety issues);5-From toilet/BSC: Supervision (verbal cues/safety issues)  FIM - Banker Devices: Walker;Bed rails Bed/Chair Transfer: 7: Independent: No helper;5: Bed > Chair or  W/C: Supervision (verbal cues/safety issues)  FIM - Locomotion: Wheelchair Locomotion: Wheelchair: 0: Activity did not occur FIM - Locomotion: Ambulation Locomotion: Health visitor  Devices: Designer, industrial/product Ambulation/Gait Assistance: 5: Supervision Locomotion: Ambulation: 5: Travels 150 ft or more with supervision/safety issues  Comprehension Comprehension Mode: Auditory Comprehension: 6-Follows complex conversation/direction: With extra time/assistive device  Expression Expression Mode: Verbal Expression: 5-Expresses basic needs/ideas: With no assist  Social Interaction Social Interaction: 6-Interacts appropriately with others with medication or extra time (anti-anxiety, antidepressant).  Problem Solving Problem Solving: 6-Solves complex problems: With extra time  Memory Memory: 6-More than reasonable amt of time  Medical Problem List and Plan:  1. DVT Prophylaxis/Anticoagulation: Mechanical: Antiembolism stockings, knee (TED hose) Bilateral lower extremities , lovenox --may dc 2. Pain Management: tylenol, tramadol, modalities, stretching as needed  3. Mood: team to provide ego support as needed.  -added xanax for periodic anxiety- it is now scheduled and seems to be helping-can continue for short term with idea of changing back to prn.   -lexapro resumed 4. Neuropsych: This patient is capable of making decisions on his/her own behalf.  5. Persistent leukocytosis: augmentin completed  6. PUD: continue to monitor H/H.  Continue BID protonix.  7. CV: maintain lisinopril for afterload reduction and metoprolol for rate control. Effient per cards as well. Follow for stability of cardiac parameters with increased physical activity.     -replaced potassium  -outpt cards follow up 8. Diarrhea: resolved  -increased probiotic  -schedule imodium--change back to prn  -stool has already slowed  -likely abx induced--stopped augmentin.  -  LOS (Days) 6 A FACE TO FACE  EVALUATION WAS PERFORMED  Quinterious Walraven T 11/15/2012 8:54 AM

## 2012-11-15 NOTE — Progress Notes (Addendum)
Speech Language Pathology Daily Session Note & Discharge Summary  Patient Details  Name: Samantha King MRN: [id number] Date of Birth: 18-Sep-1946  Today's Date: 11/15/2012 Time: 0835-0900 Time Calculation (min): 25 min  Short Term Goals: Week 1: SLP Short Term Goal 1 (Week 1): Patient will demonstrate use of vocal hygiene strategies with min assist verbal cues to self monitor and correct SLP Short Term Goal 2 (Week 1): Patient will demonstrate use of multi-modal communication strategeis with sueprvision level verbal cues SLP Short Term Goal 3 (Week 1): Patient will perform diaphragmatic breathing exercises with min assist multi-modal cues.  Skilled Therapeutic Interventions: Skilled treatment session focused on addressing patient's recall and carryover with recommended vocal hygiene program.  Patient demonstrated improved vocal quality today and took notes to assist with recall of vocal rest, hydration and steam, as well as recommendation for follow with ENT in 1 month if vocal function has not returned to baseline.     FIM:  Comprehension Comprehension Mode: Auditory Comprehension: 6-Follows complex conversation/direction: With extra time/assistive device Expression Expression Mode: Verbal Expression: 6-Expresses complex ideas: With extra time/assistive device Social Interaction Social Interaction: 6-Interacts appropriately with others with medication or extra time (anti-anxiety, antidepressant). Problem Solving Problem Solving: 6-Solves complex problems: With extra time Memory Memory: 6-More than reasonable amt of time  Pain Pain Assessment Pain Assessment: yes Pain Score:   4 Pain Type: Acute pain Pain Location: Rib cage Pain Descriptors: Aching Pain Intervention(s): RN aware   Therapy/Group: Individual Therapy   Speech Language Pathology Discharge Summary  Patient Details  Name: Samantha King MRN: [id number] Date of Birth: January 29, 1946  Today's Date:  11/15/2012  Patient has met 1 of 1 long term goals.  Patient to discharge at overall Modified Independent level.  Reasons goals not met: n/a   Clinical Impression/Discharge Summary: Upon evaluation patient presented with moderately severe dysphonia.  SLP educated, provided handouts and feedback regarding carryover, self-monitoring and correcting.  Patient has demonstrated improved vocal quality with carryover and has the tools to continue upon discharge.    Care Partner:  Caregiver Able to Provide Assistance: Yes    Recommendation:  Other (comment) (ENT if not resolve in 1 month)     Equipment: none   Reasons for discharge: Treatment goals met;Discharged from hospital   Patient/Family Agrees with Progress Made and Goals Achieved: Yes   See FIM for current functional status  Charlane Ferretti., CCC-SLP [phone]  Samantha King 11/15/2012, 1:07 PM

## 2012-11-15 NOTE — Progress Notes (Signed)
Physical Therapy Session Note  Patient Details  Name: Samantha King MRN: [id number] Date of Birth: [birth year]  Today's Date: 11/15/2012 Time: 0905-1000 Time Calculation (min): 55 min  Short Term Goals:= LTGs     Skilled Therapeutic Interventions/Progress Updates:      Therapy Documentation Precautions:  Precautions Precautions: Fall Restrictions Weight Bearing Restrictions: No   Vital Signs:   Pain: Pain Assessment Pain Assessment: No/denies pain  Static Standing Balance Static Standing - Level of Assistance: 6: Modified independent (Device/Increase time)     See FIM for current functional status  Treatment today: gait training x 150' with supervision.  Balance retraining: with toe taps onto floor numbers, min assist for LOB with SLS occasionally. On compliant Airex mat for mini squats in modified tandem foot positions  Retrieving objects from floor; able to pick up 3 without LOB, but fatigued required standing rest break. Stepping on floor ladder x 2, with L foot catching slat x 1, balance recovered independently.  30 m timed walk test= 13 seconds; 2.52'/second, = decreased functional efficiency  Therapy/Group: Individual Therapy  Maleeka Sabatino 11/15/2012, 3:10 PM

## 2012-11-15 NOTE — Progress Notes (Signed)
Patient ID: Samantha King, female   DOB: 1945-12-23, 67 y.o.   MRN: [id number]   SUBJECTIVE:  The patient is doing well and feeling well today. She does not have any chest discomfort with deep breath. She does have some discomfort with coughing. She understands that this is most likely rib pain. She is stable. She feels ready to go home.   Filed Vitals:   11/14/12 0500 11/14/12 1511 11/14/12 1920 11/15/12 0500  BP: 110/71 121/79 120/76 103/71  Pulse: 71 66 80 70  Temp: 98.1 F (36.7 C) 97.4 F (36.3 C)  97.8 F (36.6 C)  TempSrc: Oral Oral  Oral  Resp: 17 18  19   Weight: 156 lb 15.5 oz (71.2 kg)   155 lb 6.8 oz (70.5 kg)  SpO2: 95% 95%  94%    Intake/Output Summary (Last 24 hours) at 11/15/12 0742 Last data filed at 11/14/12 1742  Gross per 24 hour  Intake    600 ml  Output      0 ml  Net    600 ml    LABS: Basic Metabolic Panel:  Basename 11/13/12 0632  NA 141  K 4.0  CL 105  CO2 25  GLUCOSE 96  BUN 12  CREATININE 0.57  CALCIUM 9.3  MG --  PHOS --   Liver Function Tests: No results found for this basename: AST:2,ALT:2,ALKPHOS:2,BILITOT:2,PROT:2,ALBUMIN:2 in the last 72 hours No results found for this basename: LIPASE:2,AMYLASE:2 in the last 72 hours CBC: No results found for this basename: WBC:2,NEUTROABS:2,HGB:2,HCT:2,MCV:2,PLT:2 in the last 72 hours Cardiac Enzymes: No results found for this basename: CKTOTAL:3,CKMB:3,CKMBINDEX:3,TROPONINI:3 in the last 72 hours BNP: No components found with this basename: POCBNP:3 D-Dimer: No results found for this basename: DDIMER:2 in the last 72 hours Hemoglobin A1C: No results found for this basename: HGBA1C in the last 72 hours Fasting Lipid Panel: No results found for this basename: CHOL,HDL,LDLCALC,TRIG,CHOLHDL,LDLDIRECT in the last 72 hours Thyroid Function Tests: No results found for this basename: TSH,T4TOTAL,FREET3,T3FREE,THYROIDAB in the last 72 hours  RADIOLOGY: Ct Abdomen Pelvis Wo  Contrast  11/01/2012  *RADIOLOGY REPORT*  Clinical Data: Decreased hemoglobin, post cardiac catheterization 10/30/12 with bilateral femoral catheter placement.  CT ABDOMEN AND PELVIS WITHOUT CONTRAST  Technique:  Multidetector CT imaging of the abdomen and pelvis was performed following the standard protocol without intravenous contrast.  Comparison: None.  Findings: Small bilateral pleural effusions with associated compressive atelectasis noted.  Nasogastric tube terminates within the stomach.  Presumed vicarious excretion of contrast into the gallbladder is noted.  There is low density, 5 HU, small amount of fluid in the gallbladder fossa and also small amount of low density pelvic free fluid.  There is a trace amount of fluid tracking superiorly from the right groin along the anterior aspect of the psoas muscle which measures fluid density. Small amount of bilateral flank subcutaneous soft tissue edema or stranding is noted.  Small fat containing left inguinal hernia.  No collection is seen in either inguinal region.  Unenhanced liver, right adrenal gland, kidneys, spleen, and pancreas are unremarkable.  There is a 1.1 cm mass like lesion in the left adrenal gland measuring 14 HU, compatible with an adenoma. No free air.  No bowel wall thickening or focal segmental dilatation.  Fat containing umbilical hernia noted. Bladder is decompressed with a Foley catheter in place.  Uterus and ovaries are normal.  No acute osseous finding. Disc degenerative changes are noted in the lumbar spine, including 4 mm anterolisthesis of L4 on L5.  IMPRESSION: Trace low density fluid extending from the right groin along the anterior margin of the psoas muscle.  Although it is difficult to obtain accurate Hounsfield unit measurements to determine the composition of the fluid, areas measured are lower than expected for blood products, and if this is a retroperitoneal hematoma, it is extremely small.  Small amount of fluid around the  gallbladder, of unclear etiology. This may be seen with systemic hypoproteinemia, cholecystitis, or occasionally liver disease.  No small amount of subcutaneous edema/stranding.  Small bilateral pleural effusions.  Constellation of findings suggests mild third spacing and possible volume overload.   Original Report Authenticated By: Christiana Pellant, M.D.    Dg Chest 2 View  11/09/2012  *RADIOLOGY REPORT*  Clinical Data: Evaluate pneumonia.  Status post cardiac arrest.  CHEST - 2 VIEW  Comparison: 11/07/2012  Findings: Normal heart size.  There is a small left effusion, similar to previous exam.  The atelectasis is noted in the left base.  No new findings identified.  No airspace consolidation identified.  IMPRESSION:  1.  Persistent left effusion with overlying plate-like atelectasis.   Original Report Authenticated By: Signa Kell, M.D.    Dg Chest Port 1 View  11/07/2012  *RADIOLOGY REPORT*  Clinical Data: Follow up pneumonia  PORTABLE CHEST - 1 VIEW  Comparison: 11/05/2012  Findings: Normal heart size.  Small left pleural effusion and left foot base atelectasis is identified.  This appears similar to previous exam.  No new findings.  IMPRESSION:  1.  Persistent left pleural effusion and left base atelectasis.   Original Report Authenticated By: Signa Kell, M.D.    Dg Chest Port 1 View  11/05/2012  *RADIOLOGY REPORT*  Clinical Data: Short of breath  PORTABLE CHEST - 1 VIEW  Comparison: Yesterday  Findings: Endotracheal and NG tubes removed.  Right internal jugular central venous catheters stable.  Hazy airspace disease within the left mid and lower lung zones has improved.  The minimal atelectasis at the right base.  No pneumothorax.  IMPRESSION: Extubated.  Improved left airspace disease.   Original Report Authenticated By: Jolaine Click, M.D.    Dg Chest Port 1 View  11/04/2012  *RADIOLOGY REPORT*  Clinical Data: Pulmonary infiltrates.  Check endotracheal tube position.  PORTABLE CHEST - 1 VIEW   Comparison: One-view chest 11/03/2012.  Findings: The endotracheal tube terminates 3.5 cm above the carina, in satisfactory position.  A right IJ line stable.  The NG tube courses off the inferior border of the film.  This left basilar airspace disease is stable.  Mild pulmonary congestion is unchanged.  The visualized soft tissues and bony thorax are unremarkable.  IMPRESSION:  1.  Persistent asymmetric left basilar airspace disease.  This is concerning for pneumonia. 2.  Stable mild pulmonary vascular congestion. 3.  Satisfactory positioning of the endotracheal tube.   Original Report Authenticated By: Marin Roberts, M.D.    Dg Chest Port 1 View  11/03/2012  *RADIOLOGY REPORT*  Clinical Data: Endotracheal tube position, follow-up  PORTABLE CHEST - 1 VIEW  Comparison: Portable chest x-ray of 11/02/2012  Findings: The tip of the endotracheal tube is approximally 4.2 cm above the carina.  There does appear to be pulmonary vascular congestion present with left basilar atelectasis.  Mild cardiomegaly is stable.  The right central venous line is unchanged in position and an NG tube is noted into the stomach.  IMPRESSION:  1.  Tip of endotracheal tube 4.2 cm above the carina. 2.  Some worsening of pulmonary  vascular congestion and basilar atelectasis.   Original Report Authenticated By: Dwyane Dee, M.D.    Dg Chest Port 1 View  11/02/2012  *RADIOLOGY REPORT*  Clinical Data: Respiratory difficulty  PORTABLE CHEST - 1 VIEW  Comparison: Yesterday  Findings: Stable tubular devices.  Vascular congestion improved. Bibasilar airspace disease improved.  No pneumothorax.  IMPRESSION: Improved vascular congestion and bibasilar airspace disease.   Original Report Authenticated By: Jolaine Click, M.D.    Dg Chest Port 1 View  11/01/2012  *RADIOLOGY REPORT*  Clinical Data: Respiratory distress.  PORTABLE CHEST - 1 VIEW  Comparison: 10/31/2012.  Findings: The endotracheal tube, NG tube and right IJ catheters are  stable.  The IABP has been removed.  The cardiac silhouette, mediastinal and hilar contours are stable.  There is vascular congestion and slight increase and bibasilar atelectasis.  Probable small pleural effusions.  IMPRESSION:  1.  Stable support apparatus except the IABP has been removed. 2.  Slight increase in vascular congestion and bibasilar atelectasis.   Original Report Authenticated By: Rudie Meyer, M.D.    Dg Chest Port 1 View  10/31/2012  *RADIOLOGY REPORT*  Clinical Data: Cough, congestion, flu  PORTABLE CHEST - 1 VIEW  Comparison: 10/30/2012; 10/29/2012  Findings: Grossly unchanged cardiac silhouette and mediastinal contours.  Stable position of support apparatus including tip of intra-aortic balloon pump approximately 4 cm from the superior aspect of the aortic arch.  Pulmonary vasculature remains indistinct, left greater than right.  Grossly unchanged bibasilar opacities, left greater than right.  No new focal airspace opacity. No definite pleural effusion, though note, the left costophrenic angle is excluded view.  No pneumothorax.  Unchanged bones.  IMPRESSION: 1.  Stable positioning of support apparatus.  No pneumothorax. 2.  Grossly unchanged findings most suggestive of asymmetric pulmonary edema.   Original Report Authenticated By: Tacey Ruiz, MD    Portable Chest Xray In Am  10/30/2012  *RADIOLOGY REPORT*  Clinical Data: Intubation, follow-up  PORTABLE CHEST - 1 VIEW  Comparison: Portable chest x-ray of 10/29/2012  Findings: The carina is difficult to visualize on this rotated patient, but the tip of endotracheal tube appears to be approximately 3.7 cm above the carina.  There is minimal pulmonary vascular congestion present.  Right central venous line tip overlies the mid upper SVC.  No pneumothorax is seen.  The aortic balloon tip overlies the lower aortic knob.  IMPRESSION: Little change in aeration with mild pulmonary vascular congestion.   Original Report Authenticated By: Dwyane Dee, M.D.    Dg Chest Port 1 View  10/29/2012  *RADIOLOGY REPORT*  Clinical Data: Respiratory difficulty  PORTABLE CHEST - 1 VIEW  Comparison: None.  Findings: Endotracheal tube is 3.9 cm from the carina.  NG tube is beyond the gastroesophageal junction.  Transvenous pacemaker from the IVC in place with its tip projecting over the tip of the right ventricle.  Right internal jugular vein center venous catheter tip in the mid SVC.  Intra-aortic balloon pump tip in the proximal descending aorta.  Low lung volumes.  Normal heart size.  No pneumothorax.  Minimal patchy density at the lung bases.  IMPRESSION: Tubular structures, transvenous pacer and intra-aortic balloon pump appropriately positioned.  Bibasilar atelectasis verses airspace disease.   Original Report Authenticated By: Jolaine Click, M.D.    Ct Portable Head W/o Cm  11/02/2012  *RADIOLOGY REPORT*  Clinical Data: Altered mental status.  CT HEAD WITHOUT CONTRAST  Technique:  Contiguous axial images were obtained from the base of  the skull through the vertex without contrast.  Comparison: None.  Findings: No mass lesion, mass effect, midline shift, hydrocephalus, hemorrhage.  No territorial ischemia or acute infarction.  Allowing for portable technique, gray white differentiation appears preserved.  Low attenuation adjacent to the frontal horns of the lateral ventricles is compatible with chronic ischemic disease. Fluid is present in the left mastoid air cells.  IMPRESSION: No acute intracranial abnormality.   Original Report Authenticated By: Andreas Newport, M.D.     PHYSICAL EXAM   Patient is oriented to person time and place. Affect is normal. Lungs real a few scattered rhonchi. Cardiac exam her goals S1 and S2. There no clicks or significant murmurs. The abdomen is soft. There is no peripheral edema.   ASSESSMENT AND PLAN:  STEMI   The patient initially presented with an ST elevation MI. She underwent a coronary intervention. She did have  cardiogenic shock. She has recovered very nicely. It will be extremely important that she remain on her dual antiplatelet therapy.  Deconditioning   She's done well and is now ready to go home from the cardiac viewpoint. Her primary team will decide about discharged today.  Respiratory illness   The patient had  H1N1 And was completely treated with Tamiflu.  Potassium therapy    The patient is on a diuretic. In reviewing the medications it appears to me that she has been receiving 30 mg of potassium 3 times daily for several days. This appears to be a relatively high dose of potassium for any ongoing treatment. I have ordered a stat chemistry lab. This should be reviewed and decisions made about the final dose of potassium at the patient will go home on. It seems more likely that she would need in the range of 20 mEq once daily.  Overall the patient is stable from the cardiac viewpoint. My team will be in touch with the patient before she goes home to make post hospital cardiology followup plans with Dr. Clifton James.  It is very important that she go home on all of her cardiac medicines with careful attention to the question of potassium dosing that I have mentioned in the lines above. The followup plans will be arranged through our team on beeper [phone].  Willa Rough 11/15/2012 7:42 AM

## 2012-11-15 NOTE — Progress Notes (Signed)
Physical Therapy Discharge Summary  Patient Details  Name: Samantha King MRN: [id number] Date of Birth: 12/29/1945  Today's Date: 11/15/2012 Time: 0905-1000 Time Calculation (min): 55 min  Patient has met 8 of 8 long term goals due to improved activity tolerance, improved balance and decreased pain.  Patient to discharge at an ambulatory level Supervision.   Patient's care partner is independent to provide the necessary cognitive assistance at discharge.  Reasons goals not met: n/a  Recommendation:  Patient will benefit from ongoing skilled PT services in home health setting to continue to advance safe functional mobility, address ongoing impairments in activity tolerance, balance, dependence in functional mobility, and minimize fall risk.  Pt will transition to Cardiac Rehab when ordered by MD.  Equipment: No equipment provided; pt owns a RW  Reasons for discharge: treatment goals met and discharge from hospital  Patient/family agrees with progress made and goals achieved: Yes  PT Discharge Precautions/Restrictions Precautions Precautions: Fall Vital Signs Therapy Vitals Pulse Rate: 63  Oxygen Therapy SpO2: 96 % O2 Device: None (Room air) Pain Pain Assessment Pain Assessment: No/denies pain Vision/Perception  Vision - History Baseline Vision: Wears glasses all the time Patient Visual Report: No change from baseline Vision - Assessment Eye Alignment: Within Functional Limits Perception Perception: Within Functional Limits Praxis Praxis: Intact  Cognition Overall Cognitive Status: Appears within functional limits for tasks assessed Arousal/Alertness: Awake/alert Orientation Level: Oriented X4 Sensation Sensation Light Touch: Appears Intact Stereognosis: Appears Intact Hot/Cold: Appears Intact Proprioception: Appears Intact Coordination Gross Motor Movements are Fluid and Coordinated: Yes Fine Motor Movements are Fluid and Coordinated: Yes Motor   Motor Motor: Within Functional Limits  Mobility Bed Mobility Bed Mobility: Rolling Left;Left Sidelying to Sit Rolling Right: 6: Modified independent (Device/Increase time) Rolling Left: 6: Modified independent (Device/Increase time) Right Sidelying to Sit: 6: Modified independent (Device/Increase time) Left Sidelying to Sit: 6: Modified independent (Device/Increase time) Transfers Sit to Stand: 6: Modified independent (Device/Increase time) Stand to Sit: 6: Modified independent (Device/Increase time) Locomotion  Ambulation Ambulation: Yes Ambulation/Gait Assistance: 5: Supervision Ambulation Distance (Feet): 150 Feet Assistive device: Rolling walker Gait Gait: Yes Gait Pattern: Within Functional Limits Gait Pattern: Trunk flexed Gait velocity: 2.52'/second High Level Ambulation High Level Ambulation: Side stepping;Toe walking;Heel walking (able to transport items using one or both hands) Side Stepping: min JJA Toe Walking: min HHA Heel Walking: Min HHa Stairs / Additional Locomotion Stairs: Yes Stairs Assistance: 5: Supervision Stair Management Technique: Two rails Height of Stairs: 7  Ramp: 5: Supervision Curb: 5: Supervision Wheelchair Mobility Wheelchair Mobility: No  Trunk/Postural Assessment  Cervical Assessment Cervical Assessment: Within Functional Limits Thoracic Assessment Thoracic Assessment: Within Functional Limits Lumbar Assessment Lumbar Assessment: Within Functional Limits Postural Control Postural Control: Within Functional Limits  Balance Standardized Balance Assessment Standardized Balance Assessment: Berg Balance Test; 11/14/12 Samantha King Balance Test Sit to Stand: Able to stand without using hands and stabilize independently Standing Unsupported: Able to stand safely 2 minutes Sitting with Back Unsupported but Feet Supported on Floor or Stool: Able to sit safely and securely 2 minutes Stand to Sit: Sits safely with minimal use of hands Transfers:  Able to transfer safely, minor use of hands Standing Unsupported with Eyes Closed: Able to stand 10 seconds safely Standing Ubsupported with Feet Together: Able to place feet together independently and stand 1 minute safely From Standing, Reach Forward with Outstretched Arm: Can reach confidently >25 cm (10") From Standing Position, Pick up Object from Floor: Able to pick up shoe safely and easily From  Standing Position, Turn to Look Behind Over each Shoulder: Looks behind from both sides and weight shifts well Turn 360 Degrees: Able to turn 360 degrees safely in 4 seconds or less Standing Unsupported, Alternately Place Feet on Step/Stool: Able to complete 4 steps without aid or supervision Standing Unsupported, One Foot in Front: Needs help to step but can hold 15 seconds Standing on One Leg: Able to lift leg independently and hold equal to or more than 3 seconds Total Score: 49  Static Standing Balance Static Standing - Level of Assistance: 6: Modified independent (Device/Increase time) Extremity Assessment  RUE Assessment RUE Assessment: Within Functional Limits LUE Assessment LUE Assessment: Within Functional Limits     See FIM for current functional status  Treatment today: gait training x 150' , up/down 5 steps with 2 rails, up/ down ramp and curb, all with supervision using RW.    Balance retraining: with toe taps onto floor numbers, min assist for LOB with SLS occasionally. On compliant Airex mat for mini squats in modified tandem foot positions, heels up, toes up Retrieving objects from floor; able to pick up 3 without LOB, but fatigue required standing rest break. Stepping on floor ladder x 2, with L foot catching slat x 1, balance recovered independently. Heel and toe walking with min HHA Sit> tall kneeling on mat with forearms on bench> forearms unsupported; hip flexion/extension x 10 Closed chain hip abduction in standing, x 10 L and R with min HHA  30 m timed walk test=  13 seconds; 2.52'/second, = decreased functional efficiency  Samantha King 11/15/2012, 3:29 PM

## 2012-11-15 NOTE — Progress Notes (Signed)
Pt discharged at 1240 to home with husband. Belonging are with pt and family. Discharge instructions given by Marissa Nestle, PA with no further questions at this time.

## 2012-11-15 NOTE — Progress Notes (Signed)
Occupational Therapy Session Note  Patient Details  Name: Samantha King MRN: [id number] Date of Birth: [birth year]  Today's Date: 11/15/2012 Time: 1005-1130 Time Calculation (min): 85 min  Short Term Goals: Week 1:  OT Short Term Goal 1 (Week 1): STGs=LTGs  Skilled Therapeutic Interventions/Progress Updates:      Pt seen for BADL retraining of toileting, bathing, and dressing with a focus on pt completing all tasks at a mod I level using her RW.  Pt demonstrated good activity tolerance, used long sponge to bathe, but did not need any AE for dressing. She was able to cross her legs to don pants, socks, shoes.  Pt then worked on packing up her room to prepare for discharge from a standing level.  She demonstrated mod I with basic housekeeping and also with basic meal prep. Pt was able to reach into cupboards, refrigerator safely.  Advised patient to not do any heavy duty cooking alone, her husband should be present in case she feels fatigued.  Therapy Documentation Precautions:  Precautions Precautions: Fall Restrictions Weight Bearing Restrictions: No  Pain: Pain Assessment Pain Assessment: No/denies pain Pain Score:   4 Pain Type: Acute pain Pain Location: Rib cage Pain Descriptors: Aching Pain Intervention(s): Medication (See eMAR) ADL:  See FIM for current functional status  Therapy/Group: Individual Therapy  Brennden Masten 11/15/2012, 11:30 AM

## 2012-11-15 NOTE — Progress Notes (Signed)
Social Work Patient ID: Samantha King, female   DOB: September 15, 1946, 67 y.o.   MRN: [id number] Team feels pt has met goals for discharge, MD feels no medical issues and ready for discharge today.  Cardiac Rehab referral made on acute to [facility]. Pt-Becky feels pt needs some follow up therapies prior to Cardiac rehab.  No DME needs has from before.  Ready for discharge today.  All in agreement.

## 2012-11-16 ENCOUNTER — Telehealth: Payer: Self-pay | Admitting: Cardiovascular Disease

## 2012-11-16 DIAGNOSIS — J1000 Influenza due to other identified influenza virus with unspecified type of pneumonia: Secondary | ICD-10-CM | POA: Diagnosis not present

## 2012-11-16 DIAGNOSIS — F329 Major depressive disorder, single episode, unspecified: Secondary | ICD-10-CM | POA: Diagnosis not present

## 2012-11-16 DIAGNOSIS — IMO0001 Reserved for inherently not codable concepts without codable children: Secondary | ICD-10-CM | POA: Diagnosis not present

## 2012-11-16 DIAGNOSIS — D649 Anemia, unspecified: Secondary | ICD-10-CM | POA: Diagnosis not present

## 2012-11-16 DIAGNOSIS — Z48812 Encounter for surgical aftercare following surgery on the circulatory system: Secondary | ICD-10-CM | POA: Diagnosis not present

## 2012-11-16 DIAGNOSIS — I10 Essential (primary) hypertension: Secondary | ICD-10-CM | POA: Diagnosis not present

## 2012-11-16 NOTE — Telephone Encounter (Signed)
Left message to call back  

## 2012-11-16 NOTE — Telephone Encounter (Signed)
Spoke with pt's husband who is requesting letter indicating pt and husband were unable to take flight planned for November 09, 2012 due to pt's hospitalization.  Husband aware Dr. Clifton James will be back in office on November 18, 2012 and letter could be written at that time.

## 2012-11-16 NOTE — Telephone Encounter (Signed)
Follow-up:    Patient's husband called returning your call.  Please call back.

## 2012-11-16 NOTE — Telephone Encounter (Signed)
They need a letter stating pt had heart attack and could not make vacation trip so they can get a refund on plane tickets with [employer]. Pt would like it e-mailed to him [email or web address] if you can not e-mail just mail it to his home address

## 2012-11-18 ENCOUNTER — Encounter: Payer: Self-pay | Admitting: *Deleted

## 2012-11-18 DIAGNOSIS — Z48812 Encounter for surgical aftercare following surgery on the circulatory system: Secondary | ICD-10-CM | POA: Diagnosis not present

## 2012-11-18 DIAGNOSIS — F329 Major depressive disorder, single episode, unspecified: Secondary | ICD-10-CM | POA: Diagnosis not present

## 2012-11-18 DIAGNOSIS — IMO0001 Reserved for inherently not codable concepts without codable children: Secondary | ICD-10-CM | POA: Diagnosis not present

## 2012-11-18 DIAGNOSIS — J1000 Influenza due to other identified influenza virus with unspecified type of pneumonia: Secondary | ICD-10-CM | POA: Diagnosis not present

## 2012-11-18 DIAGNOSIS — D649 Anemia, unspecified: Secondary | ICD-10-CM | POA: Diagnosis not present

## 2012-11-18 DIAGNOSIS — I10 Essential (primary) hypertension: Secondary | ICD-10-CM | POA: Diagnosis not present

## 2012-11-18 NOTE — Telephone Encounter (Signed)
Letter written and mailed to pt.

## 2012-11-19 DIAGNOSIS — D649 Anemia, unspecified: Secondary | ICD-10-CM | POA: Diagnosis not present

## 2012-11-19 DIAGNOSIS — J1000 Influenza due to other identified influenza virus with unspecified type of pneumonia: Secondary | ICD-10-CM | POA: Diagnosis not present

## 2012-11-19 DIAGNOSIS — I10 Essential (primary) hypertension: Secondary | ICD-10-CM | POA: Diagnosis not present

## 2012-11-19 DIAGNOSIS — Z48812 Encounter for surgical aftercare following surgery on the circulatory system: Secondary | ICD-10-CM | POA: Diagnosis not present

## 2012-11-19 DIAGNOSIS — F329 Major depressive disorder, single episode, unspecified: Secondary | ICD-10-CM | POA: Diagnosis not present

## 2012-11-19 DIAGNOSIS — IMO0001 Reserved for inherently not codable concepts without codable children: Secondary | ICD-10-CM | POA: Diagnosis not present

## 2012-11-20 DIAGNOSIS — D649 Anemia, unspecified: Secondary | ICD-10-CM | POA: Diagnosis not present

## 2012-11-20 DIAGNOSIS — IMO0001 Reserved for inherently not codable concepts without codable children: Secondary | ICD-10-CM | POA: Diagnosis not present

## 2012-11-20 DIAGNOSIS — I10 Essential (primary) hypertension: Secondary | ICD-10-CM | POA: Diagnosis not present

## 2012-11-20 DIAGNOSIS — J1000 Influenza due to other identified influenza virus with unspecified type of pneumonia: Secondary | ICD-10-CM | POA: Diagnosis not present

## 2012-11-20 DIAGNOSIS — F329 Major depressive disorder, single episode, unspecified: Secondary | ICD-10-CM | POA: Diagnosis not present

## 2012-11-20 DIAGNOSIS — Z48812 Encounter for surgical aftercare following surgery on the circulatory system: Secondary | ICD-10-CM | POA: Diagnosis not present

## 2012-11-22 DIAGNOSIS — IMO0001 Reserved for inherently not codable concepts without codable children: Secondary | ICD-10-CM | POA: Diagnosis not present

## 2012-11-22 DIAGNOSIS — I10 Essential (primary) hypertension: Secondary | ICD-10-CM | POA: Diagnosis not present

## 2012-11-22 DIAGNOSIS — J1000 Influenza due to other identified influenza virus with unspecified type of pneumonia: Secondary | ICD-10-CM | POA: Diagnosis not present

## 2012-11-22 DIAGNOSIS — F329 Major depressive disorder, single episode, unspecified: Secondary | ICD-10-CM | POA: Diagnosis not present

## 2012-11-22 DIAGNOSIS — Z48812 Encounter for surgical aftercare following surgery on the circulatory system: Secondary | ICD-10-CM | POA: Diagnosis not present

## 2012-11-22 DIAGNOSIS — D649 Anemia, unspecified: Secondary | ICD-10-CM | POA: Diagnosis not present

## 2012-11-23 DIAGNOSIS — J1000 Influenza due to other identified influenza virus with unspecified type of pneumonia: Secondary | ICD-10-CM | POA: Diagnosis not present

## 2012-11-23 DIAGNOSIS — I10 Essential (primary) hypertension: Secondary | ICD-10-CM | POA: Diagnosis not present

## 2012-11-23 DIAGNOSIS — Z48812 Encounter for surgical aftercare following surgery on the circulatory system: Secondary | ICD-10-CM | POA: Diagnosis not present

## 2012-11-23 DIAGNOSIS — IMO0001 Reserved for inherently not codable concepts without codable children: Secondary | ICD-10-CM | POA: Diagnosis not present

## 2012-11-23 DIAGNOSIS — F329 Major depressive disorder, single episode, unspecified: Secondary | ICD-10-CM | POA: Diagnosis not present

## 2012-11-23 DIAGNOSIS — D649 Anemia, unspecified: Secondary | ICD-10-CM | POA: Diagnosis not present

## 2012-11-24 DIAGNOSIS — D649 Anemia, unspecified: Secondary | ICD-10-CM | POA: Diagnosis not present

## 2012-11-24 DIAGNOSIS — I10 Essential (primary) hypertension: Secondary | ICD-10-CM | POA: Diagnosis not present

## 2012-11-24 DIAGNOSIS — J1000 Influenza due to other identified influenza virus with unspecified type of pneumonia: Secondary | ICD-10-CM | POA: Diagnosis not present

## 2012-11-24 DIAGNOSIS — Z48812 Encounter for surgical aftercare following surgery on the circulatory system: Secondary | ICD-10-CM | POA: Diagnosis not present

## 2012-11-24 DIAGNOSIS — IMO0001 Reserved for inherently not codable concepts without codable children: Secondary | ICD-10-CM | POA: Diagnosis not present

## 2012-11-24 DIAGNOSIS — F329 Major depressive disorder, single episode, unspecified: Secondary | ICD-10-CM | POA: Diagnosis not present

## 2012-11-25 DIAGNOSIS — J1000 Influenza due to other identified influenza virus with unspecified type of pneumonia: Secondary | ICD-10-CM | POA: Diagnosis not present

## 2012-11-25 DIAGNOSIS — F329 Major depressive disorder, single episode, unspecified: Secondary | ICD-10-CM | POA: Diagnosis not present

## 2012-11-25 DIAGNOSIS — I10 Essential (primary) hypertension: Secondary | ICD-10-CM | POA: Diagnosis not present

## 2012-11-25 DIAGNOSIS — D649 Anemia, unspecified: Secondary | ICD-10-CM | POA: Diagnosis not present

## 2012-11-25 DIAGNOSIS — Z48812 Encounter for surgical aftercare following surgery on the circulatory system: Secondary | ICD-10-CM | POA: Diagnosis not present

## 2012-11-25 DIAGNOSIS — IMO0001 Reserved for inherently not codable concepts without codable children: Secondary | ICD-10-CM | POA: Diagnosis not present

## 2012-11-25 NOTE — Progress Notes (Signed)
Discharge summary # [phone]

## 2012-11-26 DIAGNOSIS — IMO0001 Reserved for inherently not codable concepts without codable children: Secondary | ICD-10-CM | POA: Diagnosis not present

## 2012-11-26 DIAGNOSIS — D649 Anemia, unspecified: Secondary | ICD-10-CM | POA: Diagnosis not present

## 2012-11-26 DIAGNOSIS — I10 Essential (primary) hypertension: Secondary | ICD-10-CM | POA: Diagnosis not present

## 2012-11-26 DIAGNOSIS — F329 Major depressive disorder, single episode, unspecified: Secondary | ICD-10-CM | POA: Diagnosis not present

## 2012-11-26 DIAGNOSIS — Z48812 Encounter for surgical aftercare following surgery on the circulatory system: Secondary | ICD-10-CM | POA: Diagnosis not present

## 2012-11-26 DIAGNOSIS — J1000 Influenza due to other identified influenza virus with unspecified type of pneumonia: Secondary | ICD-10-CM | POA: Diagnosis not present

## 2012-11-26 NOTE — Discharge Summary (Signed)
NAMEMarland King  Samantha, King NO.:  [id number]  MEDICAL RECORD NO.:  [id number]  LOCATION:  4147                         FACILITY:  [facility]  PHYSICIAN:  Ranelle Oyster, M.D.DATE OF BIRTH:  04/29/1946  DATE OF ADMISSION:  11/09/2012 DATE OF DISCHARGE:  11/15/2012                              DISCHARGE SUMMARY   DISCHARGE DIAGNOSES: 1. Deconditioning due to encephalopathy past cardiogenic shock. 2. ST elevation myocardial infarction. 3. Diarrhea, resolved. 4. Leukocytosis, improving. 5. Hypokalemia, supplemented. 6. Elevated anxiety levels.  HISTORY OF PRESENT ILLNESS:  Ms. Samantha King is a 67 year old female smoker with history of hypertension, who was admitted on October 29, 2012, after VFib arrest in setting of STEMI.  She had less than 1 minute of CPR, was oriented on arrival to ED.  She was taken to cath lab and was found to have occluded RCA.  She developed shock with hypoperfusion during the case requiring intubation.  DES was placed in RCA.  IPV was placed.  Influenza PCR was positive for H1N1 and chest x- ray showed left lower lobe CAP.  The patient was treated with Tamiflu and Unasyn, has been changed over to Augmentin with recommendations to continue through November 11, 2012.  V-tach treated with amiodarone.  She has required diuretics due to issues with fluid overload.  She was extubated without difficulty on November 04, 2012.  She has had heme- positive stools and PPIs were increased to b.i.d. basis.  She had problems with  confusion and hallucinations requiring Zyprexa.  CT of the head done on December 24, was negative.  Therapy evaluations done and the patient was noted to be paranoid and had difficulty following commands.  Mentation is slowly improving.  She continues to have problems with balance as well as gait deficits and therapy team recommended CIR.  The patient was admitted for CIR program for progressive therapies.  PAST MEDICAL  HISTORY:  Hypertension, postop nausea, vomiting history.  FUNCTIONAL HISTORY:  The patient was independent and active prior to admission.  FUNCTIONAL STATUS:  The patient was requiring min assist 60% for transfers, min assist 70% for ambulating 180 feet with rolling walker with 2 rest breaks.  Shortness of breath and fatigue were limiting factors.  RECENT LABS:  Check of lytes from November 15, 2012, revealed sodium 138, potassium 5, chloride 99, CO2 of 26, BUN 10, creatinine 0.68, glucose 106.  CBC revealed hemoglobin 11.7, hematocrit 35.7, white count 12.8, platelets 600.  C. diff check from November 10, 2012 and November 11, 2012, were both negative.  HOSPITAL COURSE:  Ms. Samantha King was admitted to Rehab on November 09, 2012, for inpatient therapies to consist of PT, OT, and speech therapy at least 3 hours 5 days a week.  Past admission, physiatrist, rehab, RN, and therapy team have worked together to provide customized collaborative interdisciplinary care.  Rehab RN has worked with the patient on bowel and bladder program as well as safety plan. The patient was continued on Augmentin for 24 hours initially.  She was noted to have severe diarrhea.  C. diff check was negative.   Therefore, Augmentin was discontinued.  Her white count was monitored along and has improved from 17.4 to 12.8  by time of discharge.  The patient's respiratory status has been stable.  No signs of infection noted.  The patient was noted to have hypokalemia with potassium down to 3.3, likely due to diarrhea.  She was started on aggressive potassium supplement with resolution.  Potassium at time of discharge was at 5.  Therefore supplements decreased to 10 mEq p.o. per day.  The patient has had problems with chest wall pain as well as high levels of anxiety.  She was started on Xanax to help with her anxiety symptoms.  She was noted to have problems with dysphonia.  Speech Therapy has been following  and vocal cord hygiene was recommended.  Her overall vocal quality was greatly improved and family is instructed to follow up with ENT if her voice does not normalize in the next month.  During the patient's stay in rehab, weekly team conferences were held to monitor patient's progress, set goals, as well as discuss barriers to discharge.  Physical Therapy has worked with the patient on activity tolerance, balance, as well as overall mobility.  The patient was requiring supervision for transfers as well as mobility.  She could ambulate up to 150 feet with rolling walker.  She could navigate a flight of stairs with supervision.  OT has worked with the patient on self-care tasks.  The patient was at modified independent level for bathing and dressing tasks.  Speech Therapy has worked with the patient on recall and carry over of vocal hygiene program.  The patient was independent for recalling techniques of vocal rest, hydration as well as steaming.  She was able to express complex ideas as well as do med management with extra time.  The patient family education was done regarding supervision needed for mobility and safety.  The patient's husband has been very supportive and will continue to provide supervision past discharge.  On November 15, 2012, the patient is discharged to home in improved condition.  DISCHARGE MEDICATIONS: 1. Xanax 0.25 mg p.o. t.i.d. p.r.n. anxiety. 2. Lipitor 80 mg p.o. q.p.m. 3. Lasix 40 mg p.o. per day. 4. Lisinopril 5 mg p.o. per day. 5. Lopressor 25 mg p.o. b.i.d. 6. Nitroglycerin 0.4 mg sublingual p.r.n. chest pain. 7. K-Dur 10 mEq p.o. per day. 8. Effient 10 mg p.o. per day. 9. Tramadol 50 mg p.o. q.6 hours p.r.n. pain. 10.Aspirin 81 mg p.o. per day. 11.Centrum 1 p.o. per day. 12.Celexa 20 mg p.o. per day. 13.Krill oil tabs 1 p.o. per day. 14.Omeprazole 40 mg p.o. per day. 15.Phenergan q.i.d. p.r.n. nausea. 16.Psyllium 0.52 g p.o. per day.  DIET:   Heart-healthy.  ACTIVITY LEVEL:  Needs 24-hour supervision.  SPECIAL INSTRUCTIONS:  Walk with walker.  Absolutely no smoking.  Do not use amlodipine or potassium gluconate.  Follow up with ENT if voice does not get stronger in 3-4 weeks.  [facility] to provide PT and RN. Transition to cardiac rehab once cleared by Dr. Clifton James.  FOLLOWUP:  The patient to follow up with Dr. Wynn Banker as needed. Follow up with Tereso Newcomer, PA-C on November 30, 2012, at 10:10 a.m. Follow up with Dr. Guerry Bruin, December 01, 2012, at 11:30 a.m.     Delle Reining, P.A.   ______________________________ Ranelle Oyster, M.D.    PL/MEDQ  D:  11/25/2012  T:  11/25/2012  Job:  [id number]  cc:   Gaspar Garbe, M.D. Verne Carrow, MD

## 2012-11-29 DIAGNOSIS — Z5189 Encounter for other specified aftercare: Secondary | ICD-10-CM | POA: Diagnosis not present

## 2012-11-29 DIAGNOSIS — I252 Old myocardial infarction: Secondary | ICD-10-CM | POA: Diagnosis not present

## 2012-11-29 DIAGNOSIS — Z9861 Coronary angioplasty status: Secondary | ICD-10-CM | POA: Diagnosis not present

## 2012-11-30 ENCOUNTER — Telehealth: Payer: Self-pay | Admitting: *Deleted

## 2012-11-30 ENCOUNTER — Ambulatory Visit (INDEPENDENT_AMBULATORY_CARE_PROVIDER_SITE_OTHER): Payer: Medicare Other | Admitting: Physician Assistant

## 2012-11-30 ENCOUNTER — Encounter: Payer: Self-pay | Admitting: Physician Assistant

## 2012-11-30 VITALS — BP 140/86 | HR 59 | Ht 64.5 in | Wt 156.0 lb

## 2012-11-30 DIAGNOSIS — I10 Essential (primary) hypertension: Secondary | ICD-10-CM | POA: Diagnosis not present

## 2012-11-30 DIAGNOSIS — I2119 ST elevation (STEMI) myocardial infarction involving other coronary artery of inferior wall: Secondary | ICD-10-CM

## 2012-11-30 DIAGNOSIS — E785 Hyperlipidemia, unspecified: Secondary | ICD-10-CM

## 2012-11-30 DIAGNOSIS — Z1331 Encounter for screening for depression: Secondary | ICD-10-CM | POA: Diagnosis not present

## 2012-11-30 DIAGNOSIS — I219 Acute myocardial infarction, unspecified: Secondary | ICD-10-CM | POA: Diagnosis not present

## 2012-11-30 DIAGNOSIS — I2510 Atherosclerotic heart disease of native coronary artery without angina pectoris: Secondary | ICD-10-CM

## 2012-11-30 DIAGNOSIS — Z0000 Encounter for general adult medical examination without abnormal findings: Secondary | ICD-10-CM | POA: Diagnosis not present

## 2012-11-30 LAB — BASIC METABOLIC PANEL
BUN: 13 mg/dL (ref 6–23)
CO2: 26 mEq/L (ref 19–32)
Calcium: 10.3 mg/dL (ref 8.4–10.5)
Creatinine, Ser: 0.6 mg/dL (ref 0.4–1.2)
Glucose, Bld: 111 mg/dL — ABNORMAL HIGH (ref 70–99)

## 2012-11-30 LAB — LIPID PANEL
Cholesterol: 123 mg/dL (ref 0–200)
HDL: 41.9 mg/dL (ref >39.00)
Triglycerides: 179 mg/dL — ABNORMAL HIGH (ref 0.0–149.0)

## 2012-11-30 LAB — HEPATIC FUNCTION PANEL
Albumin: 3.9 g/dL (ref 3.5–5.2)
Total Protein: 6.5 g/dL (ref 6.0–8.3)

## 2012-11-30 NOTE — Patient Instructions (Addendum)
LABS TODAY; BMET, FASTING LIPID/LIVER PANEL  NO CHANGES WERE MADE TODAY  PLEASE FOLLOW UP WITH DR. Clifton James IN 6 WEEKS
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
## 2012-11-30 NOTE — Telephone Encounter (Signed)
pt notified about lab results with verbal understanding. pt asked if there was something she could take otc for sleep/ Lorin Picket W. advised either benadryl or melatonin 3 or 5 mg ok

## 2012-11-30 NOTE — Telephone Encounter (Signed)
Message copied by Tarri Fuller on Tue Nov 30, 2012  5:13 PM ------      Message from: [place], [place] T      Created: Tue Nov 30, 2012  4:55 PM       Labs ok      Continue with current treatment plan.      Tereso Newcomer, PA-C  4:55 PM 11/30/2012

## 2012-12-01 DIAGNOSIS — Z5189 Encounter for other specified aftercare: Secondary | ICD-10-CM | POA: Diagnosis not present

## 2012-12-01 DIAGNOSIS — I252 Old myocardial infarction: Secondary | ICD-10-CM | POA: Diagnosis not present

## 2012-12-01 DIAGNOSIS — Z9861 Coronary angioplasty status: Secondary | ICD-10-CM | POA: Diagnosis not present

## 2012-12-02 DIAGNOSIS — Z9861 Coronary angioplasty status: Secondary | ICD-10-CM | POA: Diagnosis not present

## 2012-12-02 DIAGNOSIS — Z5189 Encounter for other specified aftercare: Secondary | ICD-10-CM | POA: Diagnosis not present

## 2012-12-02 DIAGNOSIS — I252 Old myocardial infarction: Secondary | ICD-10-CM | POA: Diagnosis not present

## 2012-12-05 ENCOUNTER — Encounter (HOSPITAL_COMMUNITY): Payer: Self-pay | Admitting: *Deleted

## 2012-12-05 ENCOUNTER — Emergency Department (HOSPITAL_COMMUNITY)
Admission: EM | Admit: 2012-12-05 | Discharge: 2012-12-05 | Disposition: A | Discharge status: Home / Self Care | Payer: Medicare Other | Attending: Emergency Medicine | Admitting: Emergency Medicine

## 2012-12-05 ENCOUNTER — Emergency Department (HOSPITAL_COMMUNITY): Payer: Medicare Other

## 2012-12-05 DIAGNOSIS — E785 Hyperlipidemia, unspecified: Secondary | ICD-10-CM | POA: Diagnosis not present

## 2012-12-05 DIAGNOSIS — Z79899 Other long term (current) drug therapy: Secondary | ICD-10-CM | POA: Insufficient documentation

## 2012-12-05 DIAGNOSIS — R51 Headache: Secondary | ICD-10-CM | POA: Diagnosis not present

## 2012-12-05 DIAGNOSIS — Z8619 Personal history of other infectious and parasitic diseases: Secondary | ICD-10-CM | POA: Insufficient documentation

## 2012-12-05 DIAGNOSIS — Z7902 Long term (current) use of antithrombotics/antiplatelets: Secondary | ICD-10-CM | POA: Diagnosis not present

## 2012-12-05 DIAGNOSIS — R05 Cough: Secondary | ICD-10-CM | POA: Diagnosis not present

## 2012-12-05 DIAGNOSIS — Z87891 Personal history of nicotine dependence: Secondary | ICD-10-CM | POA: Diagnosis not present

## 2012-12-05 DIAGNOSIS — Z7982 Long term (current) use of aspirin: Secondary | ICD-10-CM | POA: Insufficient documentation

## 2012-12-05 DIAGNOSIS — Z8679 Personal history of other diseases of the circulatory system: Secondary | ICD-10-CM | POA: Diagnosis not present

## 2012-12-05 DIAGNOSIS — Z8711 Personal history of peptic ulcer disease: Secondary | ICD-10-CM | POA: Insufficient documentation

## 2012-12-05 DIAGNOSIS — R059 Cough, unspecified: Secondary | ICD-10-CM | POA: Insufficient documentation

## 2012-12-05 DIAGNOSIS — R0989 Other specified symptoms and signs involving the circulatory and respiratory systems: Secondary | ICD-10-CM | POA: Diagnosis not present

## 2012-12-05 DIAGNOSIS — R079 Chest pain, unspecified: Secondary | ICD-10-CM | POA: Diagnosis not present

## 2012-12-05 DIAGNOSIS — R509 Fever, unspecified: Secondary | ICD-10-CM | POA: Insufficient documentation

## 2012-12-05 DIAGNOSIS — Z8701 Personal history of pneumonia (recurrent): Secondary | ICD-10-CM | POA: Diagnosis not present

## 2012-12-05 DIAGNOSIS — Z8669 Personal history of other diseases of the nervous system and sense organs: Secondary | ICD-10-CM | POA: Diagnosis not present

## 2012-12-05 DIAGNOSIS — K219 Gastro-esophageal reflux disease without esophagitis: Secondary | ICD-10-CM | POA: Diagnosis not present

## 2012-12-05 DIAGNOSIS — I10 Essential (primary) hypertension: Secondary | ICD-10-CM | POA: Diagnosis not present

## 2012-12-05 DIAGNOSIS — I2510 Atherosclerotic heart disease of native coronary artery without angina pectoris: Secondary | ICD-10-CM | POA: Diagnosis not present

## 2012-12-05 DIAGNOSIS — R5381 Other malaise: Secondary | ICD-10-CM | POA: Insufficient documentation

## 2012-12-05 IMAGING — CR DG CHEST 2V
2 series · 2 of 2 positions shown · non-contrast
Comparison: Chest radiograph performed [DATE]

CLINICAL DATA: Chest pain, headache and fever.

CHEST - 2 VIEW

[w chest pa]
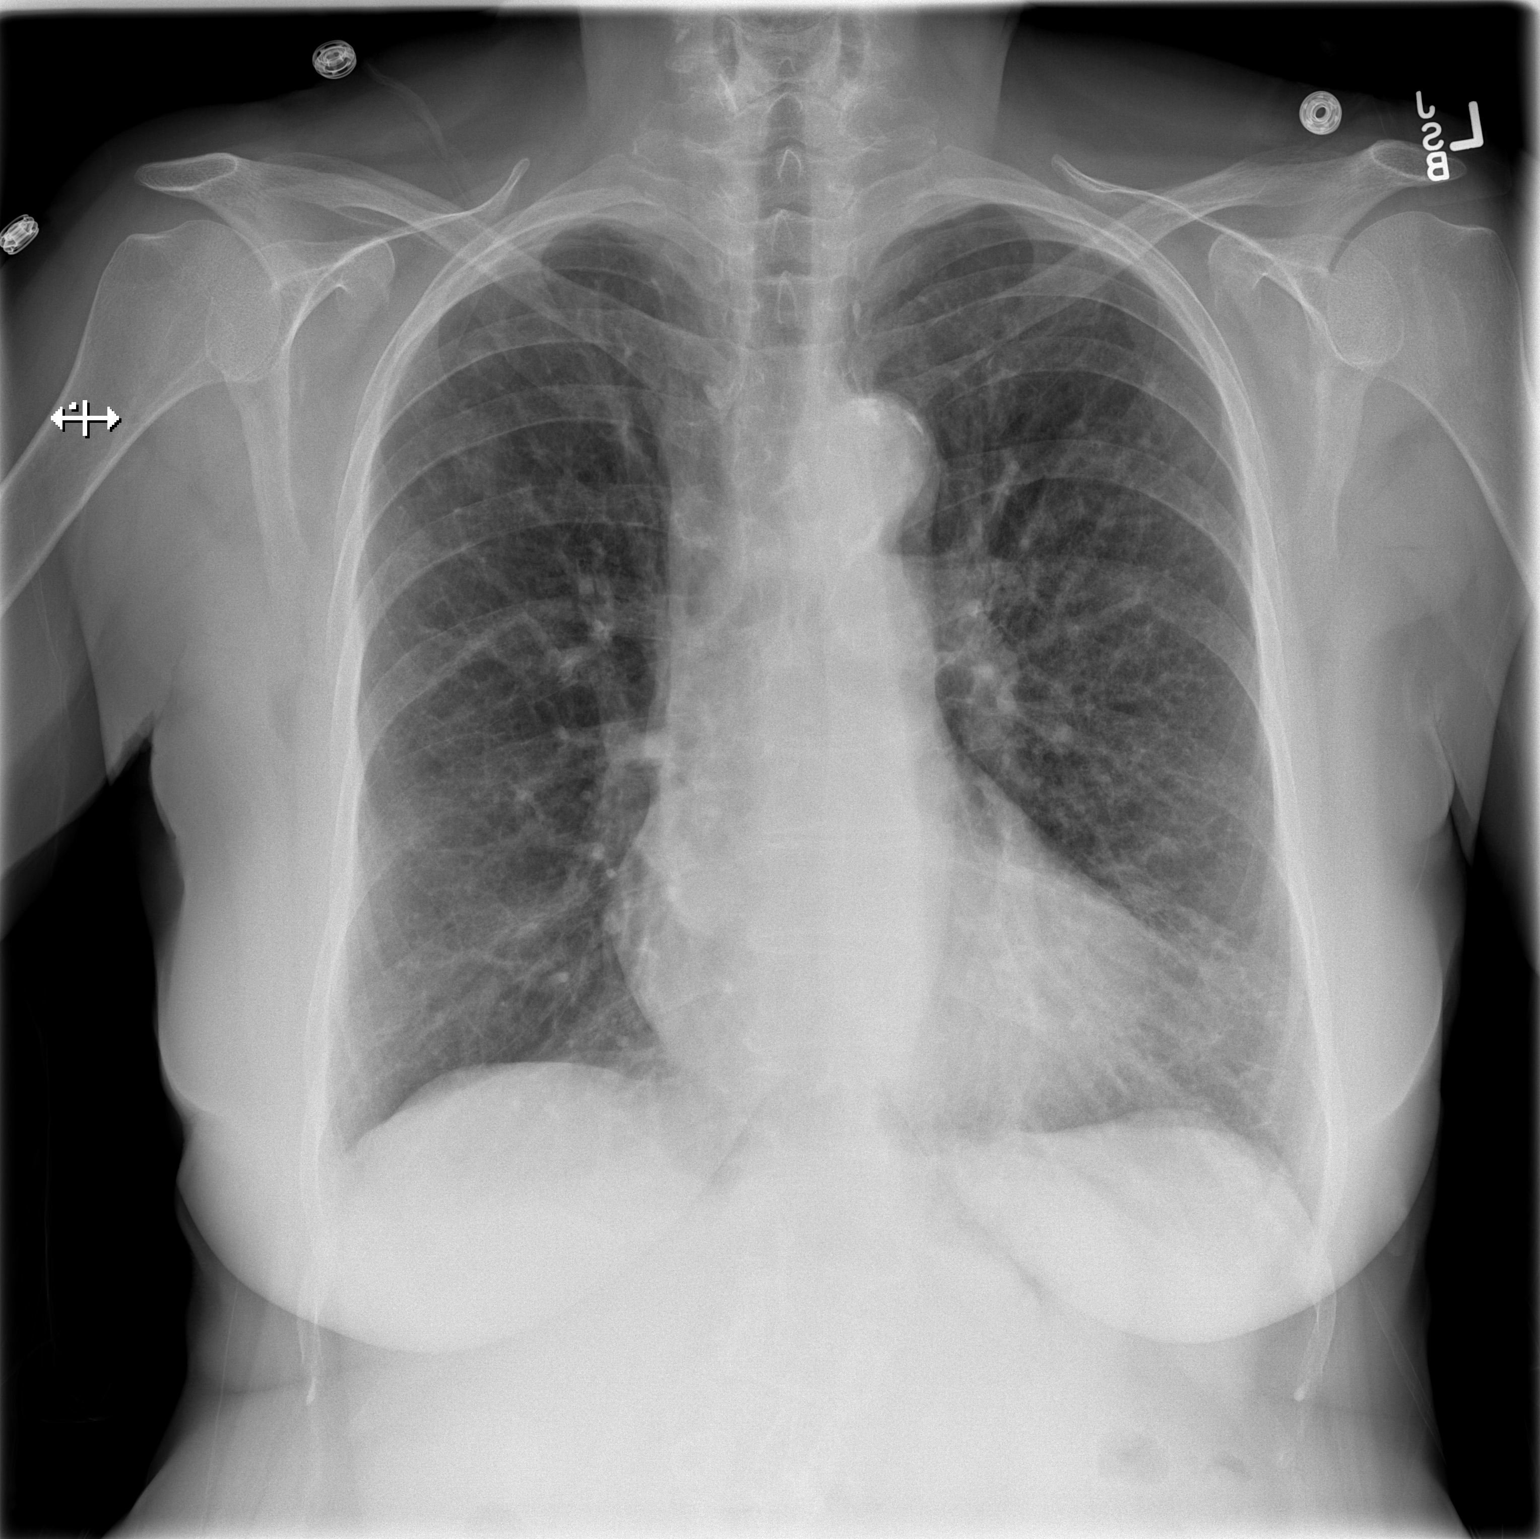

[w chest lat]
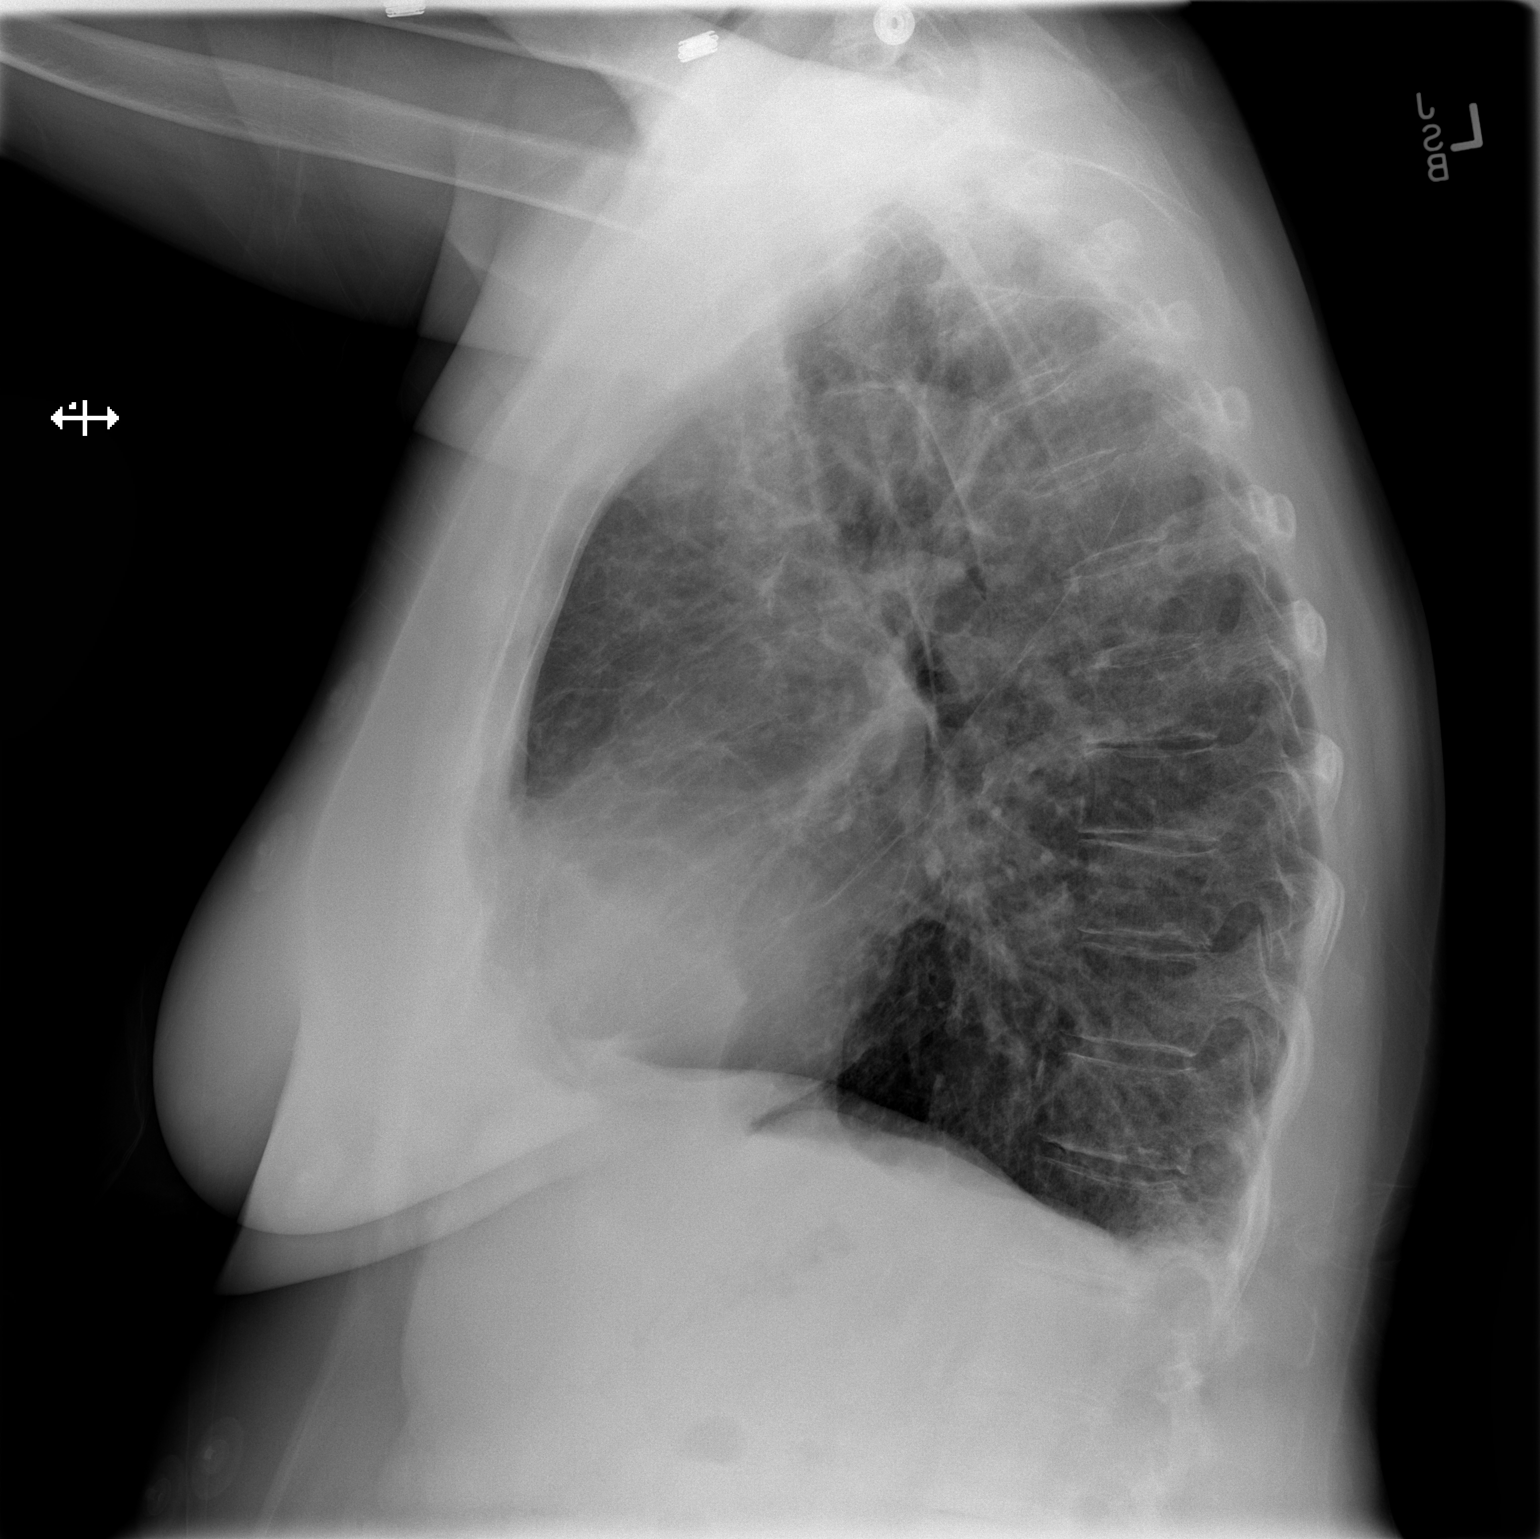

[2 of 2 positions shown; findings below may reference images not displayed]

FINDINGS: The lungs are well-aerated.  Vascular congestion is
noted.  Minimal left-sided airspace opacity may reflect mild
pneumonia.  There is no evidence of pleural effusion or
pneumothorax.

The heart is borderline normal in size; calcification is noted
within the aortic arch.  No acute osseous abnormalities are seen.
IMPRESSION: Vascular congestion noted; minimal left basilar opacity may reflect
mild pneumonia.

## 2012-12-05 MED ORDER — ALPRAZOLAM 0.25 MG PO TABS: 0.2500 mg | ORAL_TABLET | Freq: Three times a day (TID) | ORAL | Status: DC | PRN

## 2012-12-05 MED ORDER — DOXYCYCLINE HYCLATE 100 MG PO CAPS: 100.0000 mg | ORAL_CAPSULE | Freq: Two times a day (BID) | ORAL | Status: DC
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
## 2012-12-05 NOTE — ED Notes (Signed)
Pt took 650mg  of Tylenol at home at 0500 for fever

## 2012-12-05 NOTE — ED Notes (Signed)
C/o flu like sx. Onset yesterday. Lists:  HA, cough, feel lousy, chills, fever 100.3.  Mentions MI in December.  Started cardiac rehab last week. Recent visits at PCP and card MD.

## 2012-12-05 NOTE — ED Notes (Signed)
Talking with husband, no pain at present

## 2012-12-05 NOTE — ED Notes (Signed)
Pt states she is feeling better and is ready to go home. Pt has taken herself off of monitor and put her clothes on

## 2012-12-05 NOTE — ED Notes (Signed)
Pt c/o headache, fever and tiredness x 2 days. Was instructed by her PMD to come get checked out before taking her tamiflu.

## 2012-12-08 ENCOUNTER — Telehealth: Payer: Self-pay | Admitting: Cardiovascular Disease

## 2012-12-08 DIAGNOSIS — Z9861 Coronary angioplasty status: Secondary | ICD-10-CM | POA: Diagnosis not present

## 2012-12-08 DIAGNOSIS — I252 Old myocardial infarction: Secondary | ICD-10-CM | POA: Diagnosis not present

## 2012-12-08 DIAGNOSIS — Z5189 Encounter for other specified aftercare: Secondary | ICD-10-CM | POA: Diagnosis not present

## 2012-12-08 NOTE — Telephone Encounter (Signed)
No answer on home phone. Will continue to try and reach pt.

## 2012-12-08 NOTE — Telephone Encounter (Signed)
Spoke with pt who reports she was seen in ED recently for chills, elevated temp and sinus pressure. Started on doxycycline. Blood pressure on Monday was 111/75. Tuesday it was 130/85 and 139/86. This AM blood pressure was 145/101 prior to taking AM meds. After meds blood pressure was 127/90 at 10:30 and 119/91 at 11:30. Pt feels OK except for ongoing sinus headache.  She is not taking any decongestants and is taking all medications as listed. I have asked pt to contact primary care if sinus symptoms persist.  I also asked her to continue to monitor blood pressure in the AM and PM.  I told pt I would send message to Dr. Clifton James for additional recommendations regarding blood pressure.

## 2012-12-08 NOTE — Telephone Encounter (Signed)
No other recs. Thanks, chris 

## 2012-12-08 NOTE — Telephone Encounter (Signed)
New problem:   S/p heart attack    C/o high blood pressure    145/101- from last night  127/90 just taken @ 10:30 am

## 2012-12-09 DIAGNOSIS — H353290 Exudative age-related macular degeneration, unspecified eye, stage unspecified: Secondary | ICD-10-CM | POA: Diagnosis not present

## 2012-12-09 DIAGNOSIS — I252 Old myocardial infarction: Secondary | ICD-10-CM | POA: Diagnosis not present

## 2012-12-09 DIAGNOSIS — Z5189 Encounter for other specified aftercare: Secondary | ICD-10-CM | POA: Diagnosis not present

## 2012-12-09 DIAGNOSIS — Z9861 Coronary angioplasty status: Secondary | ICD-10-CM | POA: Diagnosis not present

## 2012-12-09 NOTE — Telephone Encounter (Signed)
Spoke with pt. She thinks her home blood pressure cuff may be giving her falsely elevated readings. She went to cardiac rehab yesterday and blood pressure was 106/70,110/70 and 105/72. She is going to take her cuff to next rehab to have it checked.

## 2012-12-09 NOTE — Telephone Encounter (Signed)
Left message to call back  

## 2012-12-09 NOTE — Telephone Encounter (Signed)
Pt rtn call to PAT

## 2012-12-10 DIAGNOSIS — H35349 Macular cyst, hole, or pseudohole, unspecified eye: Secondary | ICD-10-CM | POA: Diagnosis not present

## 2012-12-11 DIAGNOSIS — I252 Old myocardial infarction: Secondary | ICD-10-CM | POA: Diagnosis not present

## 2012-12-11 DIAGNOSIS — Z5189 Encounter for other specified aftercare: Secondary | ICD-10-CM | POA: Diagnosis not present

## 2012-12-11 DIAGNOSIS — Z9861 Coronary angioplasty status: Secondary | ICD-10-CM | POA: Diagnosis not present

## 2012-12-21 DIAGNOSIS — I10 Essential (primary) hypertension: Secondary | ICD-10-CM | POA: Diagnosis not present

## 2012-12-21 DIAGNOSIS — J189 Pneumonia, unspecified organism: Secondary | ICD-10-CM | POA: Diagnosis not present

## 2012-12-21 DIAGNOSIS — I2510 Atherosclerotic heart disease of native coronary artery without angina pectoris: Secondary | ICD-10-CM | POA: Diagnosis not present

## 2012-12-31 DIAGNOSIS — Z01419 Encounter for gynecological examination (general) (routine) without abnormal findings: Secondary | ICD-10-CM | POA: Diagnosis not present

## 2012-12-31 DIAGNOSIS — N952 Postmenopausal atrophic vaginitis: Secondary | ICD-10-CM | POA: Diagnosis not present

## 2013-01-08 DIAGNOSIS — I252 Old myocardial infarction: Secondary | ICD-10-CM | POA: Diagnosis not present

## 2013-01-08 DIAGNOSIS — Z01812 Encounter for preprocedural laboratory examination: Secondary | ICD-10-CM | POA: Diagnosis not present

## 2013-01-08 DIAGNOSIS — Z9861 Coronary angioplasty status: Secondary | ICD-10-CM | POA: Diagnosis not present

## 2013-01-08 DIAGNOSIS — H35349 Macular cyst, hole, or pseudohole, unspecified eye: Secondary | ICD-10-CM | POA: Diagnosis not present

## 2013-01-08 DIAGNOSIS — Z5189 Encounter for other specified aftercare: Secondary | ICD-10-CM | POA: Diagnosis not present

## 2013-01-12 ENCOUNTER — Encounter: Payer: Self-pay | Admitting: Cardiovascular Disease

## 2013-01-12 ENCOUNTER — Ambulatory Visit (INDEPENDENT_AMBULATORY_CARE_PROVIDER_SITE_OTHER): Payer: Medicare Other | Admitting: Cardiovascular Disease

## 2013-01-12 VITALS — BP 96/72 | HR 65 | Ht 64.0 in | Wt 154.0 lb

## 2013-01-12 DIAGNOSIS — E785 Hyperlipidemia, unspecified: Secondary | ICD-10-CM | POA: Diagnosis not present

## 2013-01-12 DIAGNOSIS — I10 Essential (primary) hypertension: Secondary | ICD-10-CM | POA: Diagnosis not present

## 2013-01-12 DIAGNOSIS — I2510 Atherosclerotic heart disease of native coronary artery without angina pectoris: Secondary | ICD-10-CM

## 2013-01-12 NOTE — Patient Instructions (Addendum)
Your physician wants you to follow-up in:  6 months. You will receive a reminder letter in the mail two months in advance. If you don't receive a letter, please call our office to schedule the follow-up appointment.   
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
## 2013-01-13 ENCOUNTER — Telehealth: Payer: Self-pay | Admitting: Cardiovascular Disease

## 2013-01-13 NOTE — Telephone Encounter (Signed)
Spoke with dr apenzellar's office, the pt is needing to have surgery on her retina. They do not want to stop any of the pts meds prior to the procedure and she will get a block, not general anesthesia. Based on the above they would like to know if the pt can be cleared. They need something faxed to [phone]. Will forward for dr Clifton James review.

## 2013-01-13 NOTE — Telephone Encounter (Signed)
New Problem:    Called in wanting to know if the patient stayed on all of her medications would she be cleared to have surgery.  Please call back.  Office my not open until 9:30-10:00am tomorrow.

## 2013-01-14 ENCOUNTER — Encounter: Payer: Self-pay | Admitting: Cardiovascular Disease

## 2013-01-14 NOTE — Telephone Encounter (Signed)
See letter. cdm 

## 2013-01-17 ENCOUNTER — Ambulatory Visit: Payer: Self-pay | Admitting: Ophthalmology

## 2013-01-17 NOTE — Telephone Encounter (Signed)
Will have medical records fax letter today

## 2013-01-25 ENCOUNTER — Telehealth: Payer: Self-pay | Admitting: Cardiovascular Disease

## 2013-01-25 NOTE — Telephone Encounter (Signed)
That will be ok. cdm 

## 2013-01-25 NOTE — Telephone Encounter (Signed)
Spoke with pt who reports she is scheduled for eye surgery tomorrow and wanted to make sure this is OK with Dr. Clifton James. Pt confirms that eye surgeon told her she could have surgery while taking ASA and Effient. She is taking these as ordered.  I reviewed letter and told pt Dr. Clifton James has cleared her for eye surgery as long as ASA and Effient were not stopped at any time.  I will let Dr. Clifton James know of planned surgery.

## 2013-01-25 NOTE — Telephone Encounter (Signed)
Please return call to patient Patient Cell  [phone], regarding upcoming eye surgery and meds.

## 2013-01-26 ENCOUNTER — Ambulatory Visit: Payer: Self-pay | Admitting: Ophthalmology

## 2013-01-26 DIAGNOSIS — Z79899 Other long term (current) drug therapy: Secondary | ICD-10-CM | POA: Diagnosis not present

## 2013-01-26 DIAGNOSIS — H35349 Macular cyst, hole, or pseudohole, unspecified eye: Secondary | ICD-10-CM | POA: Diagnosis not present

## 2013-01-26 DIAGNOSIS — Z7982 Long term (current) use of aspirin: Secondary | ICD-10-CM | POA: Diagnosis not present

## 2013-01-26 DIAGNOSIS — Z9861 Coronary angioplasty status: Secondary | ICD-10-CM | POA: Diagnosis not present

## 2013-01-26 DIAGNOSIS — K279 Peptic ulcer, site unspecified, unspecified as acute or chronic, without hemorrhage or perforation: Secondary | ICD-10-CM | POA: Diagnosis not present

## 2013-01-26 DIAGNOSIS — I252 Old myocardial infarction: Secondary | ICD-10-CM | POA: Diagnosis not present

## 2013-01-26 DIAGNOSIS — Z885 Allergy status to narcotic agent status: Secondary | ICD-10-CM | POA: Diagnosis not present

## 2013-01-26 DIAGNOSIS — Z888 Allergy status to other drugs, medicaments and biological substances status: Secondary | ICD-10-CM | POA: Diagnosis not present

## 2013-01-26 DIAGNOSIS — E7800 Pure hypercholesterolemia, unspecified: Secondary | ICD-10-CM | POA: Diagnosis not present

## 2013-02-08 DIAGNOSIS — Z5189 Encounter for other specified aftercare: Secondary | ICD-10-CM | POA: Diagnosis not present

## 2013-02-08 DIAGNOSIS — H35349 Macular cyst, hole, or pseudohole, unspecified eye: Secondary | ICD-10-CM | POA: Diagnosis not present

## 2013-02-08 DIAGNOSIS — Z01812 Encounter for preprocedural laboratory examination: Secondary | ICD-10-CM | POA: Diagnosis not present

## 2013-02-08 DIAGNOSIS — I252 Old myocardial infarction: Secondary | ICD-10-CM | POA: Diagnosis not present

## 2013-02-08 DIAGNOSIS — Z9861 Coronary angioplasty status: Secondary | ICD-10-CM | POA: Diagnosis not present

## 2013-02-09 DIAGNOSIS — J189 Pneumonia, unspecified organism: Secondary | ICD-10-CM | POA: Diagnosis not present

## 2013-02-22 DIAGNOSIS — R918 Other nonspecific abnormal finding of lung field: Secondary | ICD-10-CM | POA: Diagnosis not present

## 2013-02-22 DIAGNOSIS — H35349 Macular cyst, hole, or pseudohole, unspecified eye: Secondary | ICD-10-CM | POA: Diagnosis not present

## 2013-02-22 DIAGNOSIS — R911 Solitary pulmonary nodule: Secondary | ICD-10-CM | POA: Diagnosis not present

## 2013-03-10 DIAGNOSIS — I252 Old myocardial infarction: Secondary | ICD-10-CM | POA: Diagnosis not present

## 2013-03-10 DIAGNOSIS — Z01812 Encounter for preprocedural laboratory examination: Secondary | ICD-10-CM | POA: Diagnosis not present

## 2013-03-10 DIAGNOSIS — H35349 Macular cyst, hole, or pseudohole, unspecified eye: Secondary | ICD-10-CM | POA: Diagnosis not present

## 2013-03-10 DIAGNOSIS — Z5189 Encounter for other specified aftercare: Secondary | ICD-10-CM | POA: Diagnosis not present

## 2013-03-14 DIAGNOSIS — H35349 Macular cyst, hole, or pseudohole, unspecified eye: Secondary | ICD-10-CM | POA: Diagnosis not present

## 2013-03-14 DIAGNOSIS — Z01812 Encounter for preprocedural laboratory examination: Secondary | ICD-10-CM | POA: Diagnosis not present

## 2013-03-14 DIAGNOSIS — Z5189 Encounter for other specified aftercare: Secondary | ICD-10-CM | POA: Diagnosis not present

## 2013-03-14 DIAGNOSIS — I252 Old myocardial infarction: Secondary | ICD-10-CM | POA: Diagnosis not present

## 2013-03-16 DIAGNOSIS — Z5189 Encounter for other specified aftercare: Secondary | ICD-10-CM | POA: Diagnosis not present

## 2013-03-16 DIAGNOSIS — Z01812 Encounter for preprocedural laboratory examination: Secondary | ICD-10-CM | POA: Diagnosis not present

## 2013-03-16 DIAGNOSIS — I252 Old myocardial infarction: Secondary | ICD-10-CM | POA: Diagnosis not present

## 2013-03-16 DIAGNOSIS — H35349 Macular cyst, hole, or pseudohole, unspecified eye: Secondary | ICD-10-CM | POA: Diagnosis not present

## 2013-03-17 DIAGNOSIS — I252 Old myocardial infarction: Secondary | ICD-10-CM | POA: Diagnosis not present

## 2013-03-17 DIAGNOSIS — Z01812 Encounter for preprocedural laboratory examination: Secondary | ICD-10-CM | POA: Diagnosis not present

## 2013-03-17 DIAGNOSIS — H35349 Macular cyst, hole, or pseudohole, unspecified eye: Secondary | ICD-10-CM | POA: Diagnosis not present

## 2013-03-17 DIAGNOSIS — Z5189 Encounter for other specified aftercare: Secondary | ICD-10-CM | POA: Diagnosis not present

## 2013-03-21 DIAGNOSIS — Z5189 Encounter for other specified aftercare: Secondary | ICD-10-CM | POA: Diagnosis not present

## 2013-03-21 DIAGNOSIS — I252 Old myocardial infarction: Secondary | ICD-10-CM | POA: Diagnosis not present

## 2013-03-21 DIAGNOSIS — H35349 Macular cyst, hole, or pseudohole, unspecified eye: Secondary | ICD-10-CM | POA: Diagnosis not present

## 2013-03-21 DIAGNOSIS — Z01812 Encounter for preprocedural laboratory examination: Secondary | ICD-10-CM | POA: Diagnosis not present

## 2013-04-11 ENCOUNTER — Other Ambulatory Visit: Payer: Self-pay | Admitting: Emergency Medicine

## 2013-04-11 MED ORDER — NITROGLYCERIN 0.4 MG SL SUBL: 0.4000 mg | SUBLINGUAL_TABLET | SUBLINGUAL | Status: DC | PRN

## 2013-04-12 ENCOUNTER — Telehealth: Payer: Self-pay | Admitting: Cardiovascular Disease

## 2013-04-12 DIAGNOSIS — H35349 Macular cyst, hole, or pseudohole, unspecified eye: Secondary | ICD-10-CM | POA: Diagnosis not present

## 2013-04-12 MED ORDER — NITROGLYCERIN 0.4 MG SL SUBL: 0.4000 mg | SUBLINGUAL_TABLET | SUBLINGUAL | Status: DC | PRN

## 2013-04-12 NOTE — Telephone Encounter (Signed)
Pt.notified

## 2013-04-12 NOTE — Telephone Encounter (Signed)
New problem    Pt cannot find nitroglycerine and wants to know if she can get a refill

## 2013-04-12 NOTE — Telephone Encounter (Signed)
Refill for NTG called to [facility] on [address] in [place]. I called to give pt this information. Left message to call back

## 2013-05-31 DIAGNOSIS — K219 Gastro-esophageal reflux disease without esophagitis: Secondary | ICD-10-CM | POA: Diagnosis not present

## 2013-05-31 DIAGNOSIS — I10 Essential (primary) hypertension: Secondary | ICD-10-CM | POA: Diagnosis not present

## 2013-05-31 DIAGNOSIS — Z6827 Body mass index (BMI) 27.0-27.9, adult: Secondary | ICD-10-CM | POA: Diagnosis not present

## 2013-05-31 DIAGNOSIS — F329 Major depressive disorder, single episode, unspecified: Secondary | ICD-10-CM | POA: Diagnosis not present

## 2013-05-31 DIAGNOSIS — E785 Hyperlipidemia, unspecified: Secondary | ICD-10-CM | POA: Diagnosis not present

## 2013-05-31 DIAGNOSIS — I219 Acute myocardial infarction, unspecified: Secondary | ICD-10-CM | POA: Diagnosis not present

## 2013-05-31 DIAGNOSIS — I2510 Atherosclerotic heart disease of native coronary artery without angina pectoris: Secondary | ICD-10-CM | POA: Diagnosis not present

## 2013-06-14 DIAGNOSIS — H35349 Macular cyst, hole, or pseudohole, unspecified eye: Secondary | ICD-10-CM | POA: Diagnosis not present

## 2013-06-15 ENCOUNTER — Ambulatory Visit: Payer: Self-pay | Admitting: Internal Medicine

## 2013-06-15 DIAGNOSIS — Z1231 Encounter for screening mammogram for malignant neoplasm of breast: Secondary | ICD-10-CM | POA: Diagnosis not present

## 2013-06-15 IMAGING — MG MAM DGTL SCRN MAM NO ORDER W/CAD
1 series · 5 of 5 positions shown · non-contrast
Comparison: none

REASON FOR EXAM: SCR MAMMO NO ORDER
COMMENTS:

PROCEDURE:     MAM - MAM DGTL SCRN MAM NO ORDER W/CAD  - [DATE]  [DATE]
RESULT:     Bilateral digital screening mammogram with CAD dated [DATE]

[R CC · right · 5 of 5 slices shown]
[im 1/5]
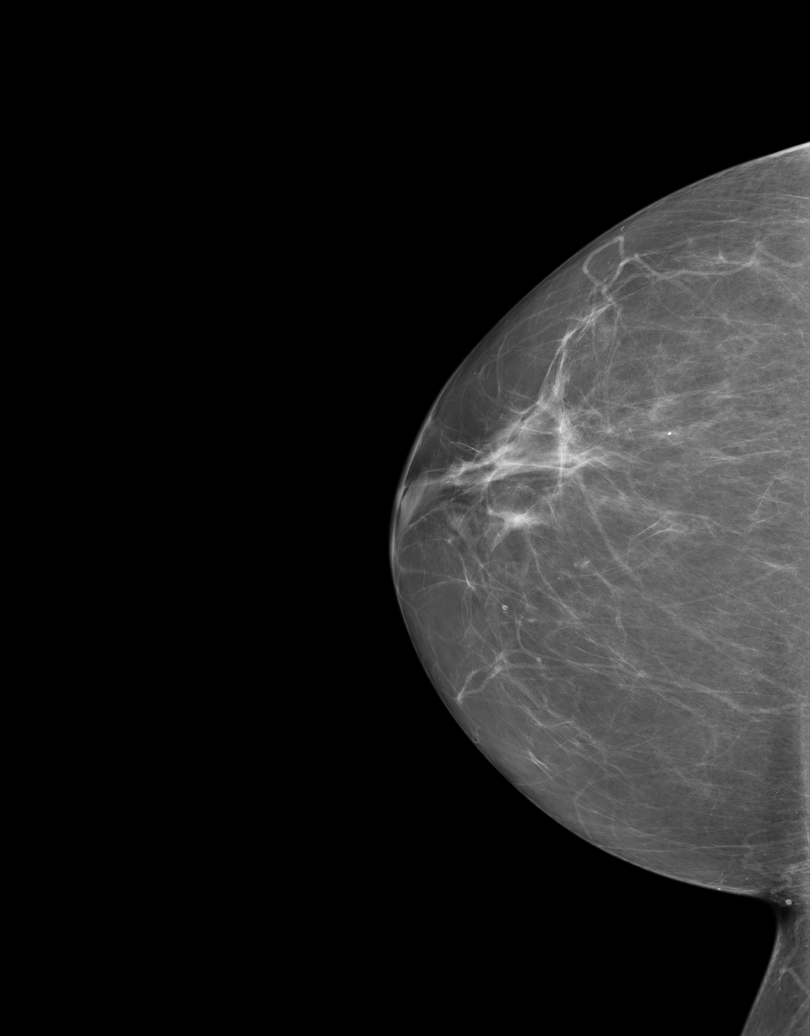
[im 2/5]
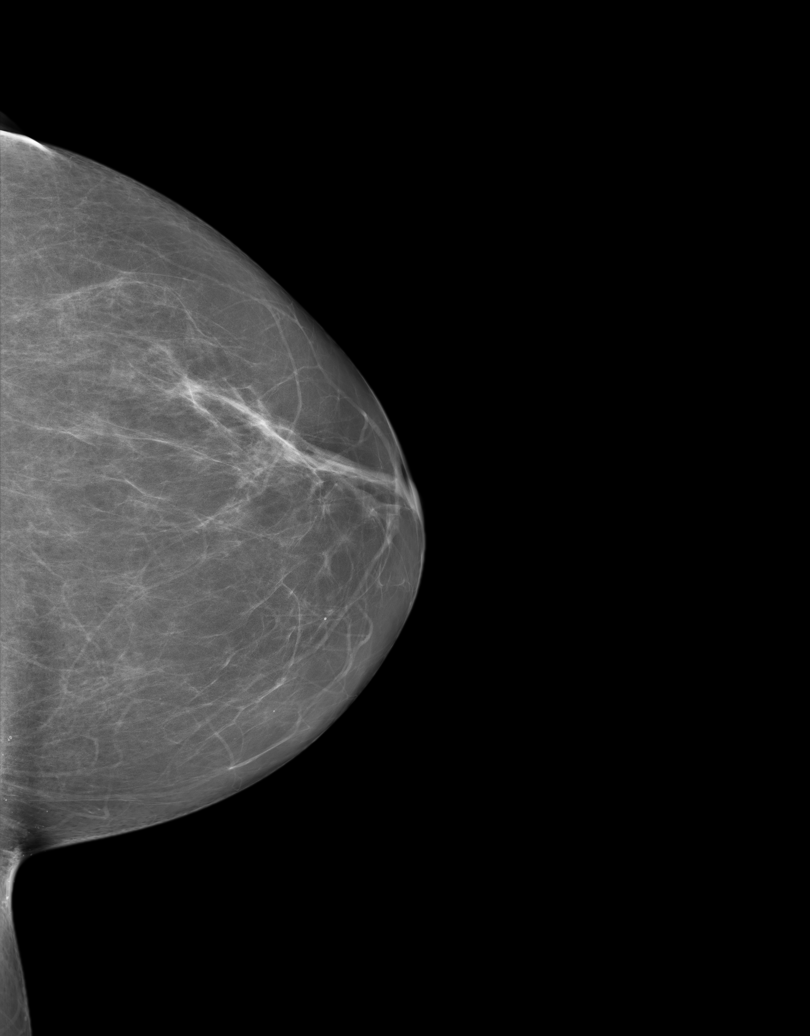
[im 3/5]
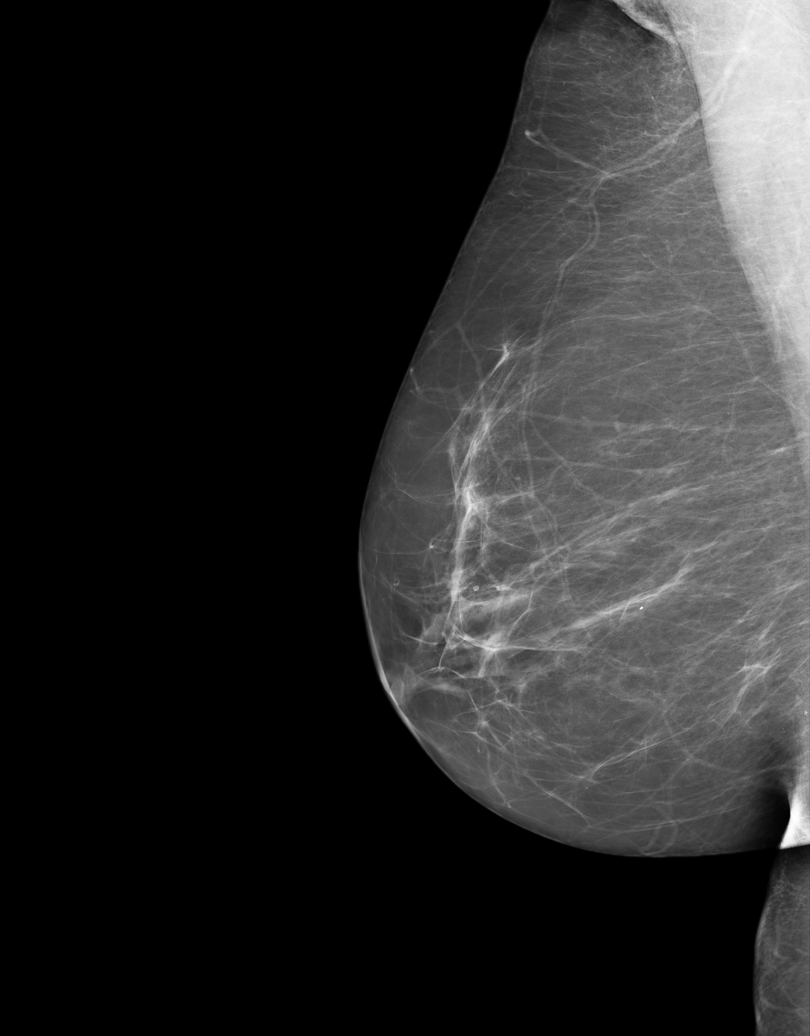
[im 4/5]
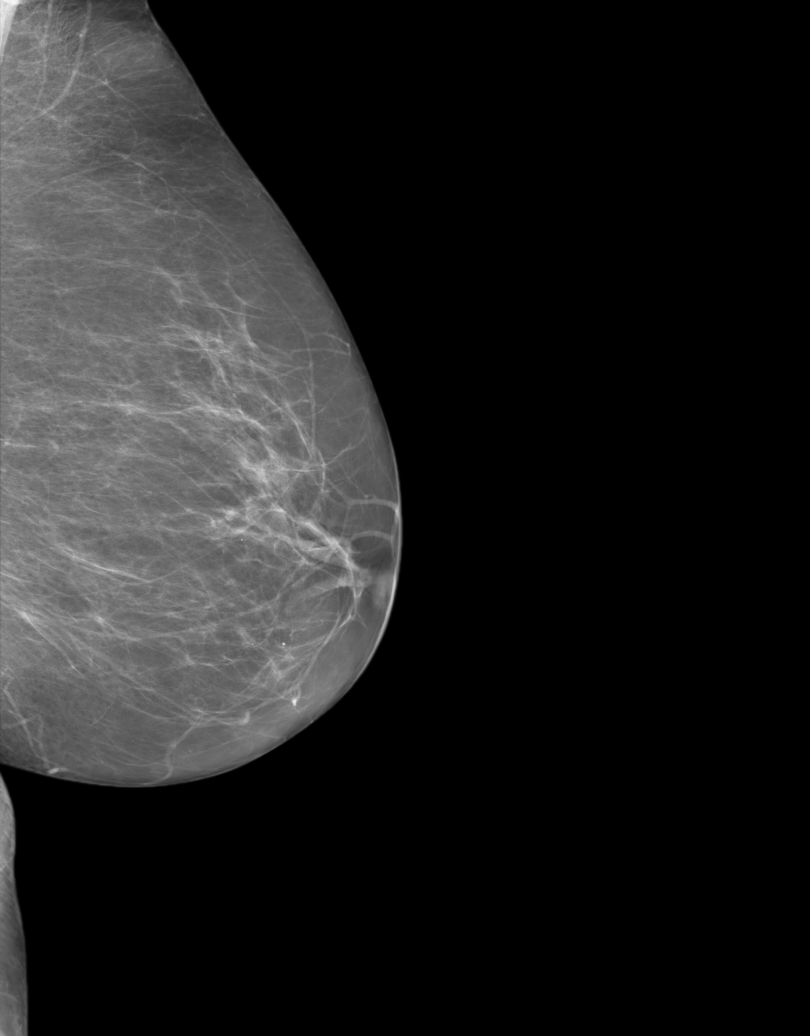
[im 5/5]
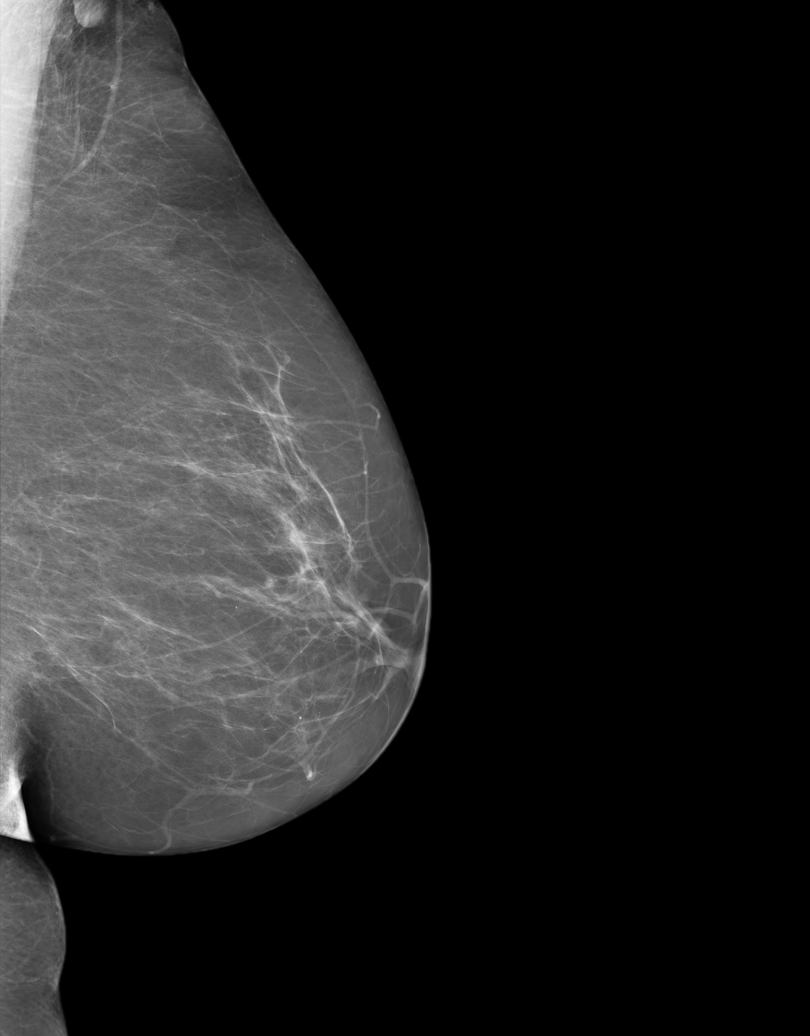

[5 of 5 positions shown; findings below may reference images not displayed]

FINDINGS: BREAST COMPOSITION: The breast composition is ALMOST ENTIRELY FATTY
(glandular tissue is less than 25%)

There is no radiographic evidence of malignant type calcifications ,
architectural distortion, spiculated masses or nodules.
IMPRESSION: BI-RADS: Category 2- Benign Finding

A NEGATIVE MAMMOGRAM REPORT DOES NOT PRECLUDE BIOPSY OR OTHER EVALUATION OF
A CLINICALLY PALPABLE OR OTHERWISE SUSPICIOUS MASS OR LESION. BREAST CANCER
MAY NOT BE DETECTED IN UP TO 10% OF CASES.

The delay in dictation secondary to an attempt to retrieve outside imaging.

## 2013-06-20 ENCOUNTER — Telehealth: Payer: Self-pay | Admitting: Cardiovascular Disease

## 2013-06-20 NOTE — Telephone Encounter (Signed)
Received request from Nurse in fax box; document faxed for surgical clearance with the following comment: The patient is cleared for surgery but she will need ASA/Effient until 12.20.14. If this has to be stopped for surgery, would delay eye surgery until 12.20.14-signed C. McAlhany To: [facility]  Fax number: [phone]

## 2013-07-13 ENCOUNTER — Ambulatory Visit: Payer: Self-pay | Admitting: Ophthalmology

## 2013-07-13 DIAGNOSIS — Z01812 Encounter for preprocedural laboratory examination: Secondary | ICD-10-CM | POA: Diagnosis not present

## 2013-07-13 DIAGNOSIS — Z01810 Encounter for preprocedural cardiovascular examination: Secondary | ICD-10-CM | POA: Diagnosis not present

## 2013-07-13 DIAGNOSIS — I10 Essential (primary) hypertension: Secondary | ICD-10-CM | POA: Diagnosis not present

## 2013-07-13 DIAGNOSIS — H2510 Age-related nuclear cataract, unspecified eye: Secondary | ICD-10-CM | POA: Diagnosis not present

## 2013-07-13 LAB — POTASSIUM: Potassium: 4.8 mmol/L (ref 3.5–5.1)

## 2013-07-29 ENCOUNTER — Encounter: Payer: Self-pay | Admitting: *Deleted

## 2013-08-04 ENCOUNTER — Ambulatory Visit (INDEPENDENT_AMBULATORY_CARE_PROVIDER_SITE_OTHER): Payer: Medicare Other | Admitting: Cardiovascular Disease

## 2013-08-04 ENCOUNTER — Encounter: Payer: Self-pay | Admitting: Cardiovascular Disease

## 2013-08-04 VITALS — BP 130/80 | HR 70 | Wt 154.0 lb

## 2013-08-04 DIAGNOSIS — I2510 Atherosclerotic heart disease of native coronary artery without angina pectoris: Secondary | ICD-10-CM

## 2013-08-04 DIAGNOSIS — I10 Essential (primary) hypertension: Secondary | ICD-10-CM | POA: Diagnosis not present

## 2013-08-04 DIAGNOSIS — E785 Hyperlipidemia, unspecified: Secondary | ICD-10-CM | POA: Diagnosis not present

## 2013-08-04 NOTE — Patient Instructions (Addendum)
Your physician wants you to follow-up in:  6 months. You will receive a reminder letter in the mail two months in advance. If you don't receive a letter, please call our office to schedule the follow-up appointment.   
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
## 2013-08-16 DIAGNOSIS — J209 Acute bronchitis, unspecified: Secondary | ICD-10-CM | POA: Diagnosis not present

## 2013-09-06 ENCOUNTER — Telehealth: Payer: Self-pay | Admitting: Cardiovascular Disease

## 2013-09-06 ENCOUNTER — Encounter: Payer: Self-pay | Admitting: Nurse Practitioner

## 2013-09-06 NOTE — Telephone Encounter (Signed)
New   Patient called in wanting samples of Effient, Samantha King is triage took the call.

## 2013-09-06 NOTE — Telephone Encounter (Signed)
Received call from patient who is requesting Effient 10 mg samples.  Samples placed at front desk for patient.  Patient also asked me to ask Dr. Clifton James for a letter documenting that she had a heart attack in December 2013 so that she can be refunded $400 that she was charged for changing airline tickets for a trip that was scheduled at that time.  I advised patient that I would send this to Dr. Clifton James and Charlotte Crumb, RN and they would notify her when this letter was ready.  Patient verbalized understanding and expressed gratitude.

## 2013-09-06 NOTE — Telephone Encounter (Signed)
Letter left at front desk for pt to pick. Pt aware

## 2013-09-12 DIAGNOSIS — Z23 Encounter for immunization: Secondary | ICD-10-CM | POA: Diagnosis not present

## 2013-09-20 DIAGNOSIS — H35349 Macular cyst, hole, or pseudohole, unspecified eye: Secondary | ICD-10-CM | POA: Diagnosis not present

## 2013-10-03 ENCOUNTER — Telehealth: Payer: Self-pay | Admitting: *Deleted

## 2013-10-03 ENCOUNTER — Telehealth: Payer: Self-pay | Admitting: Cardiovascular Disease

## 2013-10-03 NOTE — Telephone Encounter (Deleted)
New Problem.  Pt calling for samples Effeint.. Transferred to Medications//SR

## 2013-10-03 NOTE — Telephone Encounter (Signed)
Patient requests effient samples. Patient aware samples left at front desk for pick up.

## 2013-10-13 ENCOUNTER — Ambulatory Visit: Payer: Self-pay | Admitting: Ophthalmology

## 2013-10-13 DIAGNOSIS — Z01812 Encounter for preprocedural laboratory examination: Secondary | ICD-10-CM | POA: Diagnosis not present

## 2013-10-13 DIAGNOSIS — H2510 Age-related nuclear cataract, unspecified eye: Secondary | ICD-10-CM | POA: Diagnosis not present

## 2013-10-19 ENCOUNTER — Ambulatory Visit: Payer: Self-pay | Admitting: Ophthalmology

## 2013-10-19 DIAGNOSIS — Z7982 Long term (current) use of aspirin: Secondary | ICD-10-CM | POA: Diagnosis not present

## 2013-10-19 DIAGNOSIS — Z87891 Personal history of nicotine dependence: Secondary | ICD-10-CM | POA: Diagnosis not present

## 2013-10-19 DIAGNOSIS — I10 Essential (primary) hypertension: Secondary | ICD-10-CM | POA: Diagnosis not present

## 2013-10-19 DIAGNOSIS — H269 Unspecified cataract: Secondary | ICD-10-CM | POA: Diagnosis not present

## 2013-10-19 DIAGNOSIS — F411 Generalized anxiety disorder: Secondary | ICD-10-CM | POA: Diagnosis not present

## 2013-10-19 DIAGNOSIS — Z79899 Other long term (current) drug therapy: Secondary | ICD-10-CM | POA: Diagnosis not present

## 2013-10-19 DIAGNOSIS — H2510 Age-related nuclear cataract, unspecified eye: Secondary | ICD-10-CM | POA: Diagnosis not present

## 2013-10-19 DIAGNOSIS — I252 Old myocardial infarction: Secondary | ICD-10-CM | POA: Diagnosis not present

## 2013-10-19 DIAGNOSIS — Z9861 Coronary angioplasty status: Secondary | ICD-10-CM | POA: Diagnosis not present

## 2013-10-19 DIAGNOSIS — E7800 Pure hypercholesterolemia, unspecified: Secondary | ICD-10-CM | POA: Diagnosis not present

## 2013-10-19 DIAGNOSIS — Z885 Allergy status to narcotic agent status: Secondary | ICD-10-CM | POA: Diagnosis not present

## 2013-10-28 ENCOUNTER — Telehealth: Payer: Self-pay | Admitting: *Deleted

## 2013-10-28 NOTE — Telephone Encounter (Signed)
Patient aware that effient samples will be left at the front desk for pick up.

## 2013-11-09 DIAGNOSIS — J00 Acute nasopharyngitis [common cold]: Secondary | ICD-10-CM | POA: Diagnosis not present

## 2013-11-09 DIAGNOSIS — I10 Essential (primary) hypertension: Secondary | ICD-10-CM | POA: Diagnosis not present

## 2014-01-27 DIAGNOSIS — H35349 Macular cyst, hole, or pseudohole, unspecified eye: Secondary | ICD-10-CM | POA: Diagnosis not present

## 2014-02-01 ENCOUNTER — Ambulatory Visit (INDEPENDENT_AMBULATORY_CARE_PROVIDER_SITE_OTHER): Payer: Medicare Other | Admitting: Cardiovascular Disease

## 2014-02-01 ENCOUNTER — Encounter: Payer: Self-pay | Admitting: Cardiovascular Disease

## 2014-02-01 VITALS — BP 134/82 | HR 61 | Wt 168.0 lb

## 2014-02-01 DIAGNOSIS — E785 Hyperlipidemia, unspecified: Secondary | ICD-10-CM

## 2014-02-01 DIAGNOSIS — I2510 Atherosclerotic heart disease of native coronary artery without angina pectoris: Secondary | ICD-10-CM | POA: Diagnosis not present

## 2014-02-01 DIAGNOSIS — R82998 Other abnormal findings in urine: Secondary | ICD-10-CM | POA: Diagnosis not present

## 2014-02-01 DIAGNOSIS — I10 Essential (primary) hypertension: Secondary | ICD-10-CM | POA: Diagnosis not present

## 2014-02-01 MED ORDER — CLOPIDOGREL BISULFATE 75 MG PO TABS: 75.0000 mg | ORAL_TABLET | Freq: Every day | ORAL | Status: DC
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
## 2014-02-01 NOTE — Patient Instructions (Addendum)
Your physician wants you to follow-up in:  6 months. You will receive a reminder letter in the mail two months in advance. If you don't receive a letter, please call our office to schedule the follow-up appointment.  Your physician has recommended you make the following change in your medication:  Stop Effient after you finish the supply you have.  After you finish Effient start Plavix 75 mg by mouth daily

## 2014-02-06 DIAGNOSIS — Z1331 Encounter for screening for depression: Secondary | ICD-10-CM | POA: Diagnosis not present

## 2014-02-06 DIAGNOSIS — I2510 Atherosclerotic heart disease of native coronary artery without angina pectoris: Secondary | ICD-10-CM | POA: Diagnosis not present

## 2014-02-06 DIAGNOSIS — J984 Other disorders of lung: Secondary | ICD-10-CM | POA: Diagnosis not present

## 2014-02-06 DIAGNOSIS — IMO0002 Reserved for concepts with insufficient information to code with codable children: Secondary | ICD-10-CM | POA: Diagnosis not present

## 2014-02-06 DIAGNOSIS — K219 Gastro-esophageal reflux disease without esophagitis: Secondary | ICD-10-CM | POA: Diagnosis not present

## 2014-02-06 DIAGNOSIS — E785 Hyperlipidemia, unspecified: Secondary | ICD-10-CM | POA: Diagnosis not present

## 2014-02-06 DIAGNOSIS — I219 Acute myocardial infarction, unspecified: Secondary | ICD-10-CM | POA: Diagnosis not present

## 2014-02-06 DIAGNOSIS — F3289 Other specified depressive episodes: Secondary | ICD-10-CM | POA: Diagnosis not present

## 2014-02-06 DIAGNOSIS — Z0000 Encounter for general adult medical examination without abnormal findings: Secondary | ICD-10-CM | POA: Diagnosis not present

## 2014-02-06 DIAGNOSIS — F329 Major depressive disorder, single episode, unspecified: Secondary | ICD-10-CM | POA: Diagnosis not present

## 2014-02-08 ENCOUNTER — Telehealth: Payer: Self-pay | Admitting: Cardiovascular Disease

## 2014-02-08 NOTE — Telephone Encounter (Signed)
Takes Omeprazole 40 mg daily and was given Rx for Plavix. When she picked up Rx says there was a warning and would like to make sure ok to take together. Will forward to Dr Angelena Form for review.

## 2014-02-08 NOTE — Telephone Encounter (Signed)
New problem   Pt got her new medication filled yesterday for Clopidogrel 75mg  and it stated that if she takes Omeprazole not to take the Clopidogrel. Pt want to speak to nurse before she take this new medication.

## 2014-02-09 MED ORDER — PANTOPRAZOLE SODIUM 40 MG PO TBEC: 40.0000 mg | DELAYED_RELEASE_TABLET | Freq: Every day | ORAL | Status: DC

## 2014-02-09 NOTE — Telephone Encounter (Signed)
Can we change her to Protonix 40 mg daily? Her Plavix is being started as maintenance anti-platelet therapy only since she is one year out from her cardiac event but being on Protonix would be better long term. Thanks, chris

## 2014-02-09 NOTE — Telephone Encounter (Signed)
Pt is aware of MD's recommendations. Protonix 40 mg sent to [facility] pharmacy. Pt aware.

## 2014-02-14 DIAGNOSIS — Z1212 Encounter for screening for malignant neoplasm of rectum: Secondary | ICD-10-CM | POA: Diagnosis not present

## 2014-02-28 DIAGNOSIS — M810 Age-related osteoporosis without current pathological fracture: Secondary | ICD-10-CM | POA: Diagnosis not present

## 2014-03-08 DIAGNOSIS — R911 Solitary pulmonary nodule: Secondary | ICD-10-CM | POA: Diagnosis not present

## 2014-03-28 DIAGNOSIS — Z23 Encounter for immunization: Secondary | ICD-10-CM | POA: Diagnosis not present

## 2014-04-12 DIAGNOSIS — M810 Age-related osteoporosis without current pathological fracture: Secondary | ICD-10-CM | POA: Diagnosis not present

## 2014-04-12 DIAGNOSIS — Z6828 Body mass index (BMI) 28.0-28.9, adult: Secondary | ICD-10-CM | POA: Diagnosis not present

## 2014-04-12 DIAGNOSIS — K219 Gastro-esophageal reflux disease without esophagitis: Secondary | ICD-10-CM | POA: Diagnosis not present

## 2014-04-12 DIAGNOSIS — I10 Essential (primary) hypertension: Secondary | ICD-10-CM | POA: Diagnosis not present

## 2014-04-13 DIAGNOSIS — IMO0002 Reserved for concepts with insufficient information to code with codable children: Secondary | ICD-10-CM | POA: Diagnosis not present

## 2014-04-13 DIAGNOSIS — F3289 Other specified depressive episodes: Secondary | ICD-10-CM | POA: Diagnosis not present

## 2014-04-13 DIAGNOSIS — M810 Age-related osteoporosis without current pathological fracture: Secondary | ICD-10-CM | POA: Diagnosis not present

## 2014-04-13 DIAGNOSIS — Z01419 Encounter for gynecological examination (general) (routine) without abnormal findings: Secondary | ICD-10-CM | POA: Diagnosis not present

## 2014-04-13 DIAGNOSIS — Z124 Encounter for screening for malignant neoplasm of cervix: Secondary | ICD-10-CM | POA: Diagnosis not present

## 2014-04-13 DIAGNOSIS — Z6828 Body mass index (BMI) 28.0-28.9, adult: Secondary | ICD-10-CM | POA: Diagnosis not present

## 2014-04-13 DIAGNOSIS — F329 Major depressive disorder, single episode, unspecified: Secondary | ICD-10-CM | POA: Diagnosis not present

## 2014-04-13 DIAGNOSIS — N952 Postmenopausal atrophic vaginitis: Secondary | ICD-10-CM | POA: Diagnosis not present

## 2014-07-28 DIAGNOSIS — H35349 Macular cyst, hole, or pseudohole, unspecified eye: Secondary | ICD-10-CM | POA: Diagnosis not present

## 2014-07-28 DIAGNOSIS — H35379 Puckering of macula, unspecified eye: Secondary | ICD-10-CM | POA: Diagnosis not present

## 2014-07-31 ENCOUNTER — Encounter: Payer: Self-pay | Admitting: Physician Assistant

## 2014-07-31 ENCOUNTER — Ambulatory Visit (INDEPENDENT_AMBULATORY_CARE_PROVIDER_SITE_OTHER): Payer: Medicare Other | Admitting: Physician Assistant

## 2014-07-31 VITALS — BP 132/78 | HR 72 | Ht 64.0 in | Wt 172.0 lb

## 2014-07-31 DIAGNOSIS — I10 Essential (primary) hypertension: Secondary | ICD-10-CM

## 2014-07-31 DIAGNOSIS — E785 Hyperlipidemia, unspecified: Secondary | ICD-10-CM

## 2014-07-31 DIAGNOSIS — I2510 Atherosclerotic heart disease of native coronary artery without angina pectoris: Secondary | ICD-10-CM

## 2014-07-31 NOTE — Patient Instructions (Signed)
Your physician wants you to follow-up in: [place]. You will receive a reminder letter in the mail two months in advance. If you don't receive a letter, please call our office to schedule the follow-up appointment.   Your physician recommends that you continue on your current medications as directed. Please refer to the Current Medication list given to you today.   PLEASE HAVE YOUR UP COMING LAB RESULTS FAXED TO [place], [place]
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
## 2014-08-07 DIAGNOSIS — I10 Essential (primary) hypertension: Secondary | ICD-10-CM | POA: Diagnosis not present

## 2014-08-07 DIAGNOSIS — Z23 Encounter for immunization: Secondary | ICD-10-CM | POA: Diagnosis not present

## 2014-08-07 DIAGNOSIS — M810 Age-related osteoporosis without current pathological fracture: Secondary | ICD-10-CM | POA: Diagnosis not present

## 2014-08-07 DIAGNOSIS — I219 Acute myocardial infarction, unspecified: Secondary | ICD-10-CM | POA: Diagnosis not present

## 2014-08-07 DIAGNOSIS — E785 Hyperlipidemia, unspecified: Secondary | ICD-10-CM | POA: Diagnosis not present

## 2014-08-07 DIAGNOSIS — F329 Major depressive disorder, single episode, unspecified: Secondary | ICD-10-CM | POA: Diagnosis not present

## 2014-08-07 DIAGNOSIS — IMO0002 Reserved for concepts with insufficient information to code with codable children: Secondary | ICD-10-CM | POA: Diagnosis not present

## 2014-08-07 DIAGNOSIS — F3289 Other specified depressive episodes: Secondary | ICD-10-CM | POA: Diagnosis not present

## 2014-08-07 DIAGNOSIS — I2510 Atherosclerotic heart disease of native coronary artery without angina pectoris: Secondary | ICD-10-CM | POA: Diagnosis not present

## 2014-08-07 DIAGNOSIS — K219 Gastro-esophageal reflux disease without esophagitis: Secondary | ICD-10-CM | POA: Diagnosis not present

## 2014-08-28 ENCOUNTER — Ambulatory Visit: Payer: Self-pay | Admitting: Internal Medicine

## 2014-08-28 DIAGNOSIS — Z1231 Encounter for screening mammogram for malignant neoplasm of breast: Secondary | ICD-10-CM | POA: Diagnosis not present

## 2014-08-28 IMAGING — MG MAM DGTL SCRN MAM NO ORDER W/CAD
7 series · 7 of 7 positions shown · non-contrast
Comparison: Previous Exam(s)

ACR Breast Density Category a: The breast tissue is almost entirely
fatty.

CLINICAL DATA: Screening.

EXAM:
DIGITAL SCREENING BILATERAL MAMMOGRAM WITH CAD

[R CC (1 of 2)]
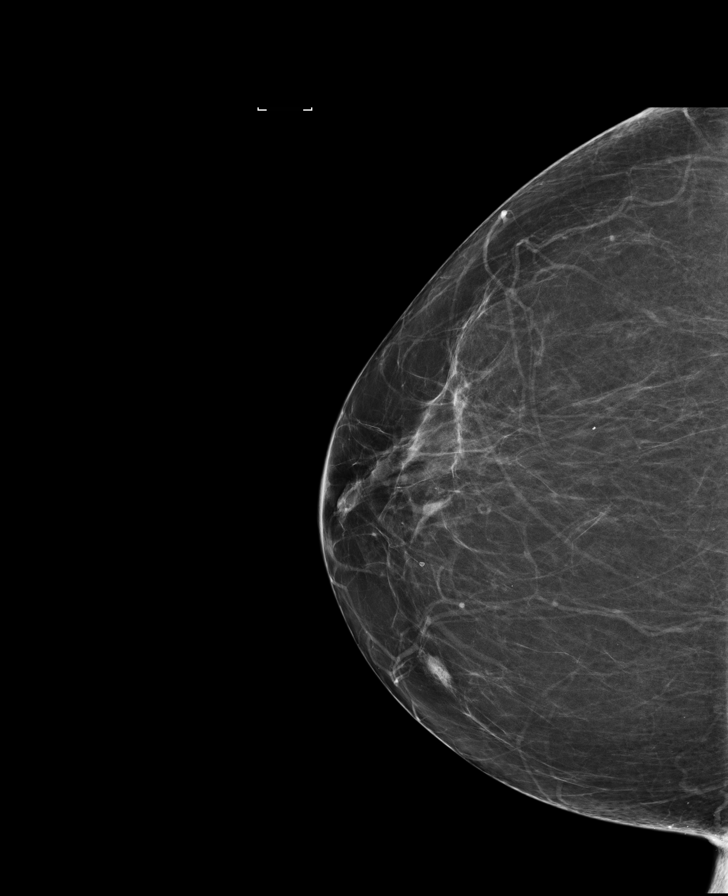

[R MLO (1 of 2)]
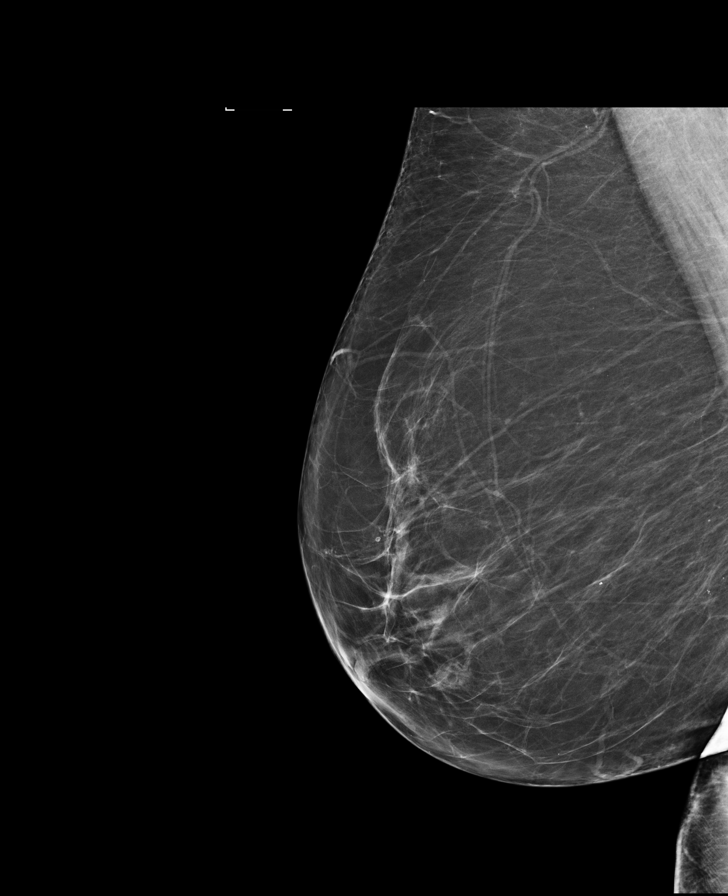

[L MLO]
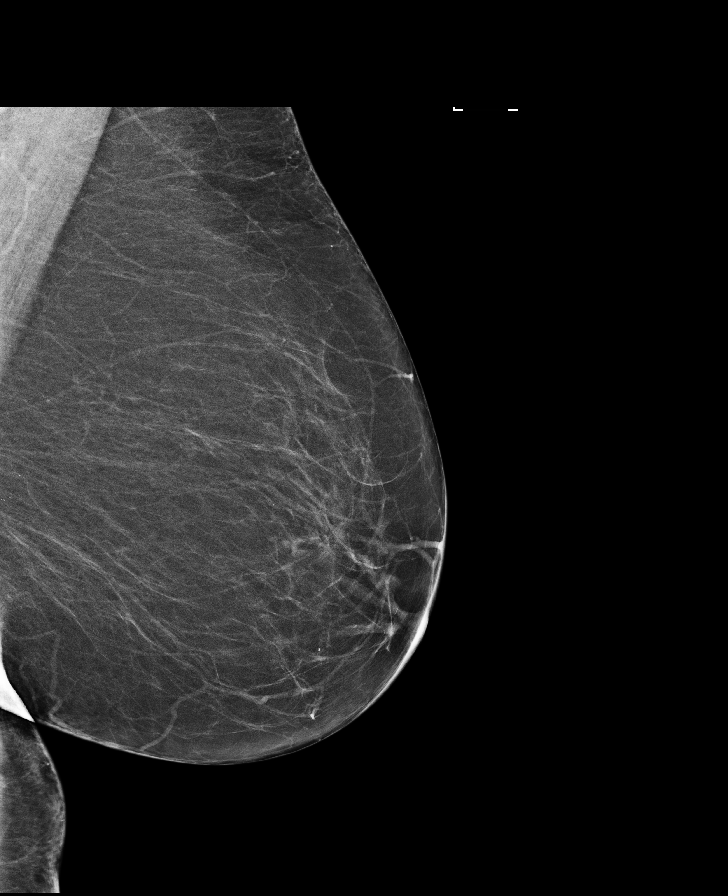

[L CC]
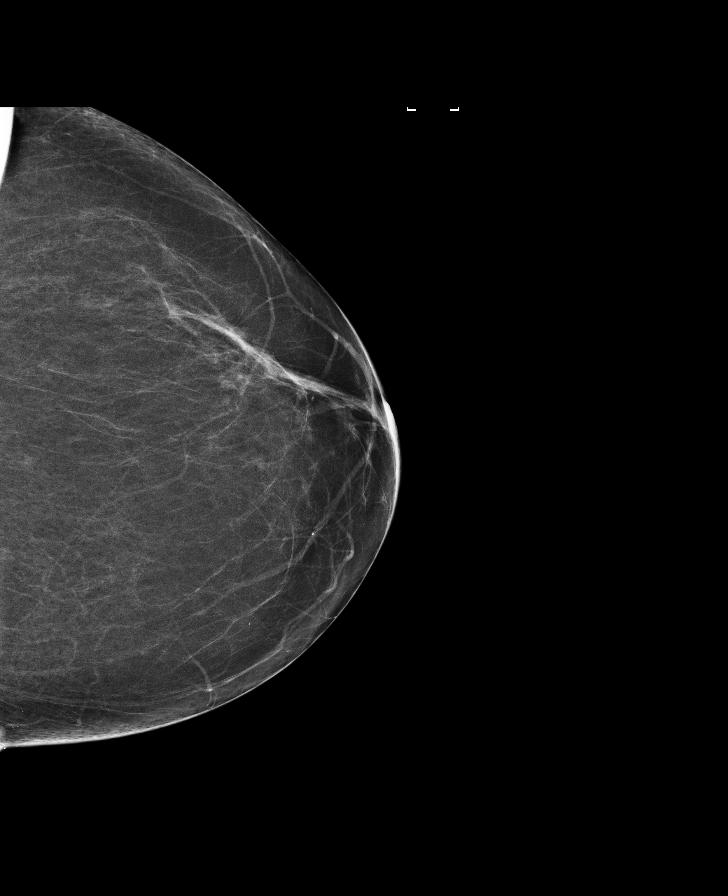

[L XCCL]
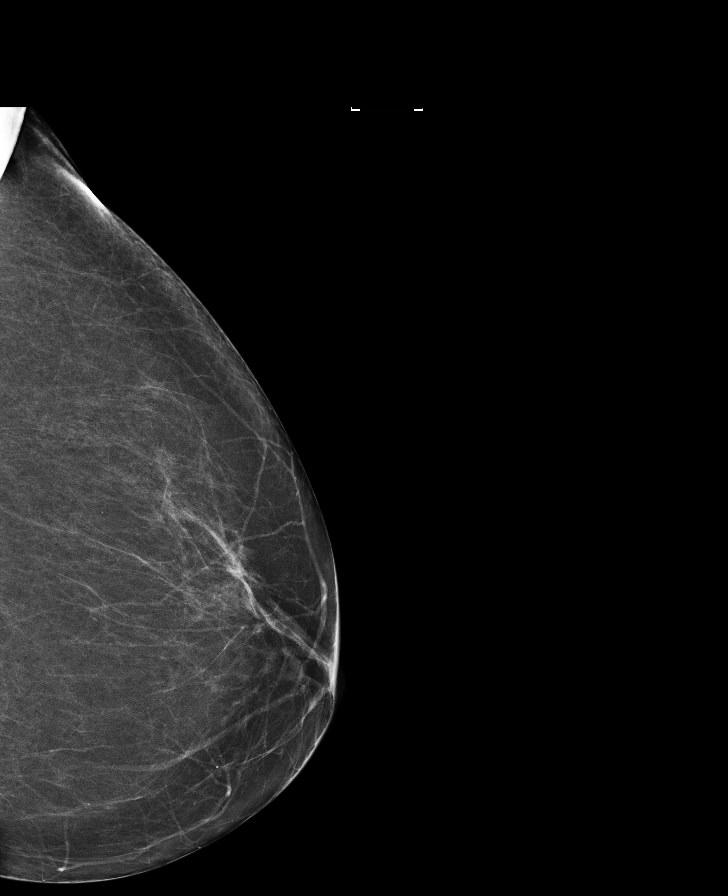

[R MLO (2 of 2)]
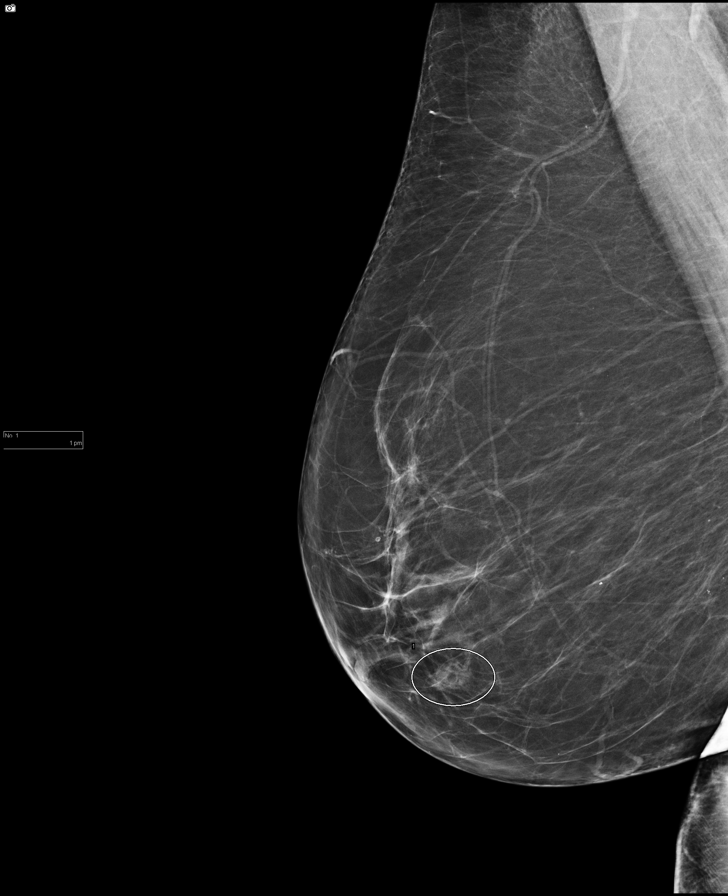

[R CC (2 of 2)]
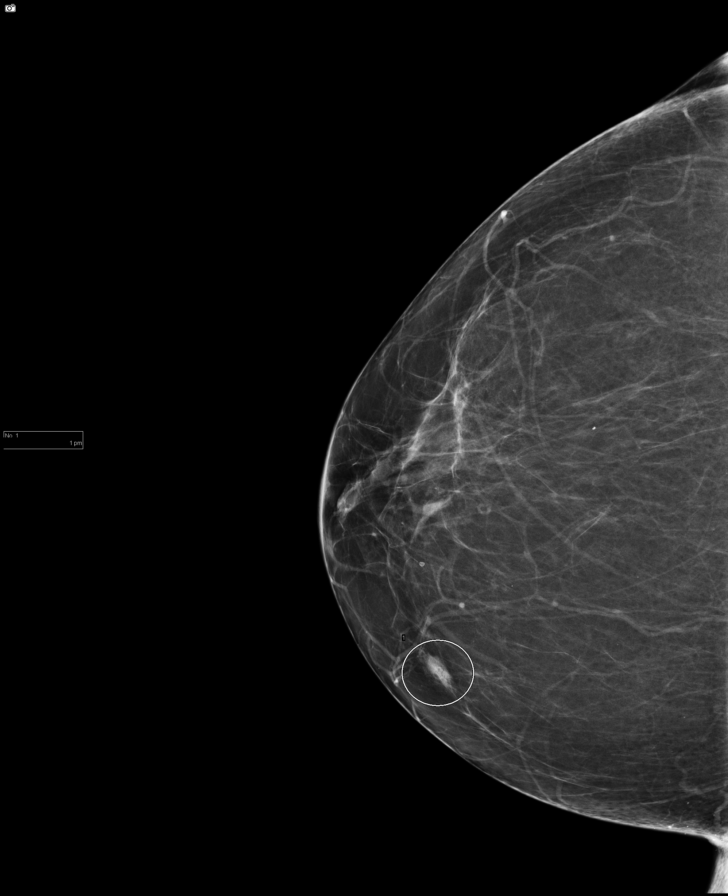

[7 of 7 positions shown; findings below may reference images not displayed]

FINDINGS: In the right breast, a possible asymmetry warrants further
evaluation with spot compression views and possibly ultrasound. In
the left breast, no findings suspicious for malignancy. Images were
processed with CAD.
IMPRESSION: Further evaluation is suggested for possible asymmetry in the right
breast.

RECOMMENDATION:
Diagnostic mammogram and possibly ultrasound of the right breast.
(Code:[3F])

The patient will be contacted regarding the findings, and additional
imaging will be scheduled.

BI-RADS CATEGORY  0: Incomplete. Need additional imaging evaluation
and/or prior mammograms for comparison.

## 2014-09-08 ENCOUNTER — Ambulatory Visit: Payer: Self-pay | Admitting: Internal Medicine

## 2014-09-08 DIAGNOSIS — N6489 Other specified disorders of breast: Secondary | ICD-10-CM | POA: Diagnosis not present

## 2014-09-08 DIAGNOSIS — R928 Other abnormal and inconclusive findings on diagnostic imaging of breast: Secondary | ICD-10-CM | POA: Diagnosis not present

## 2014-09-08 IMAGING — US US BREAST*R* LIMITED INC AXILLA
1 series · 5 of 5 positions shown · non-contrast
Comparison: [DATE], [DATE]

CLINICAL DATA: 68-year-old female, callback from screening
mammogram for right breast asymmetry

EXAM:
DIGITAL DIAGNOSTIC  RIGHT MAMMOGRAM
ULTRASOUND RIGHT BREAST

[Series 1: us breast*right* limited inc axilla · 0.10mm/px · 5 of 5 slices shown]
[im 1/5]
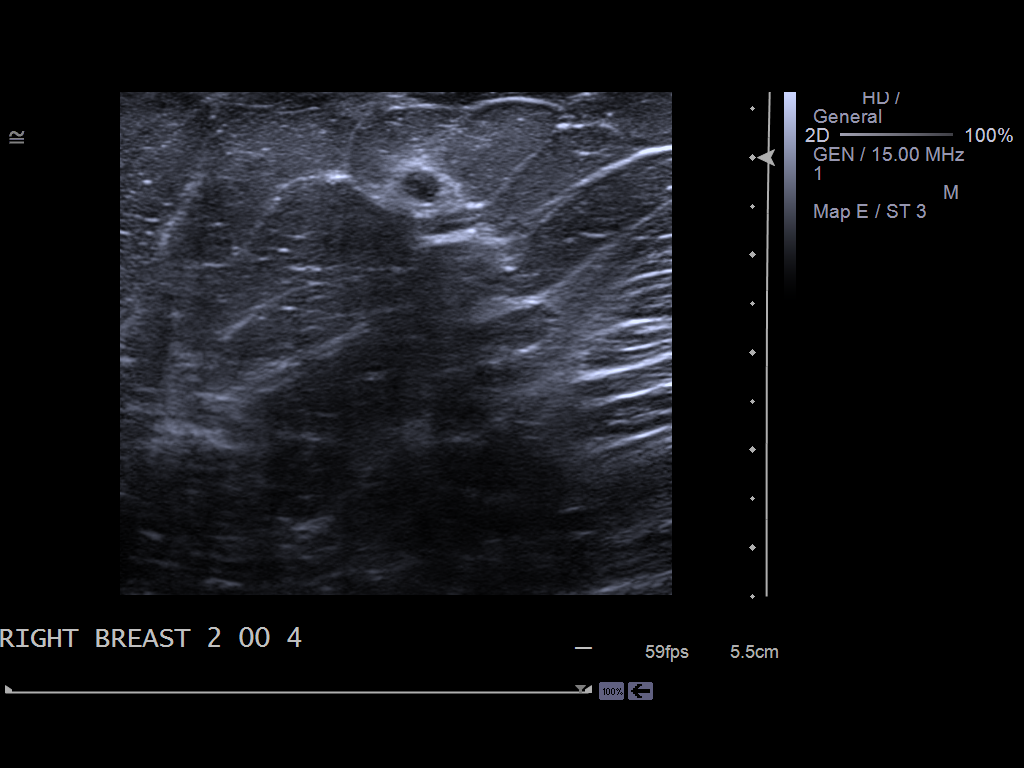
[im 2/5]
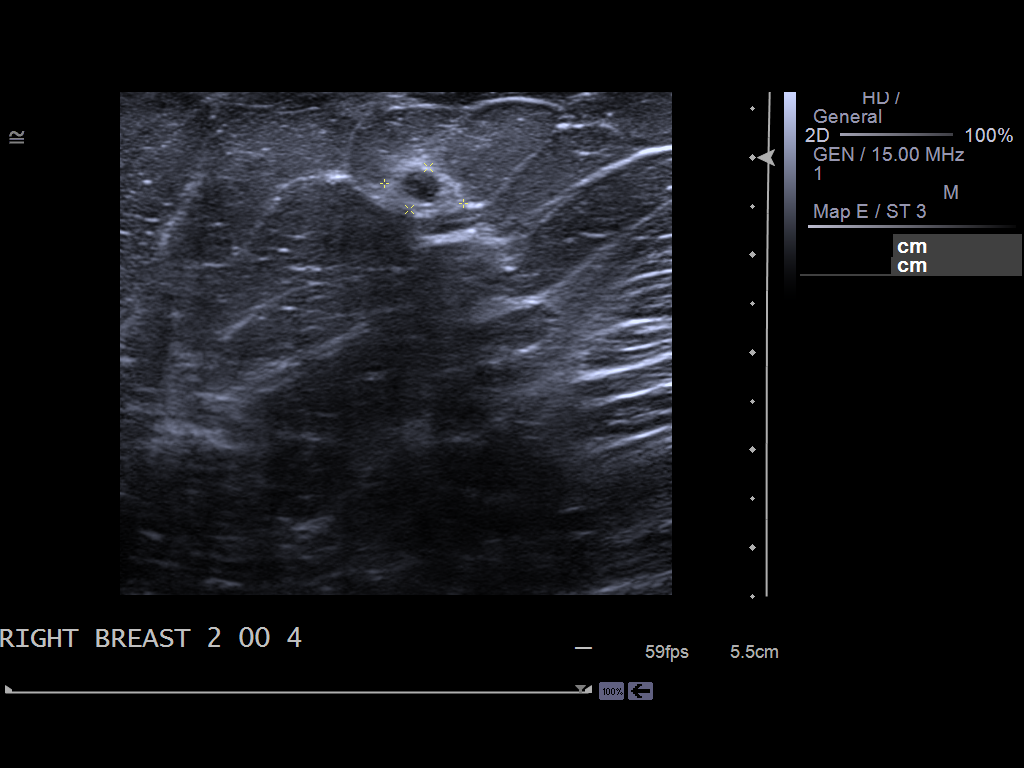
[im 3/5]
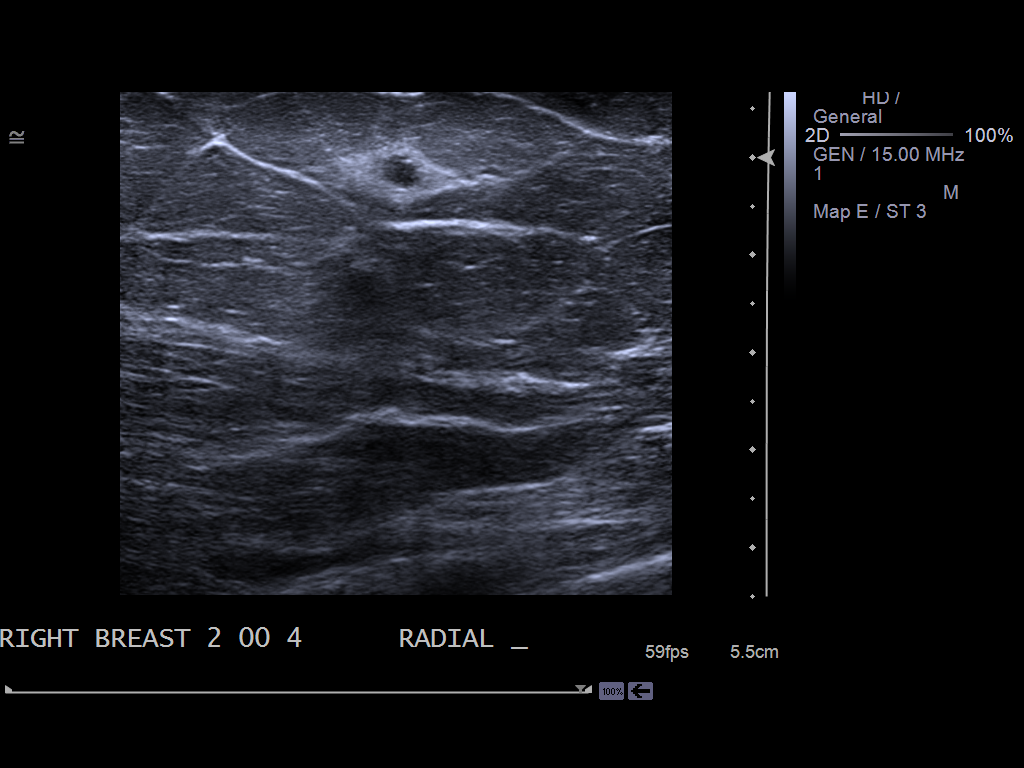
[im 4/5]
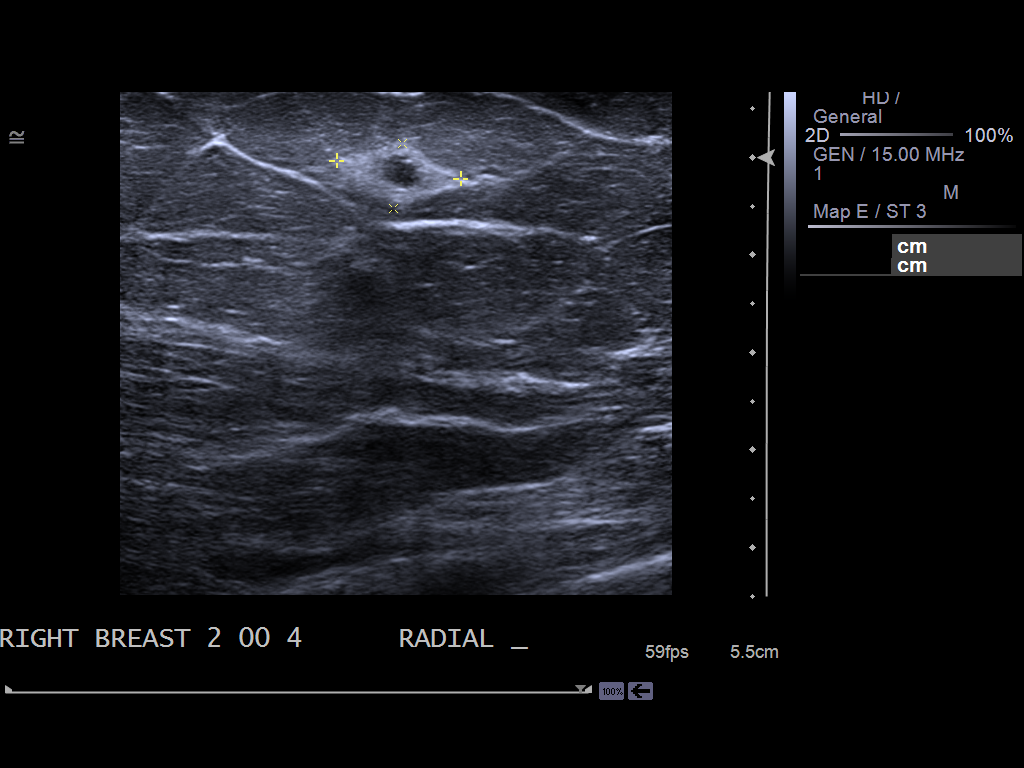
[im 5/5]
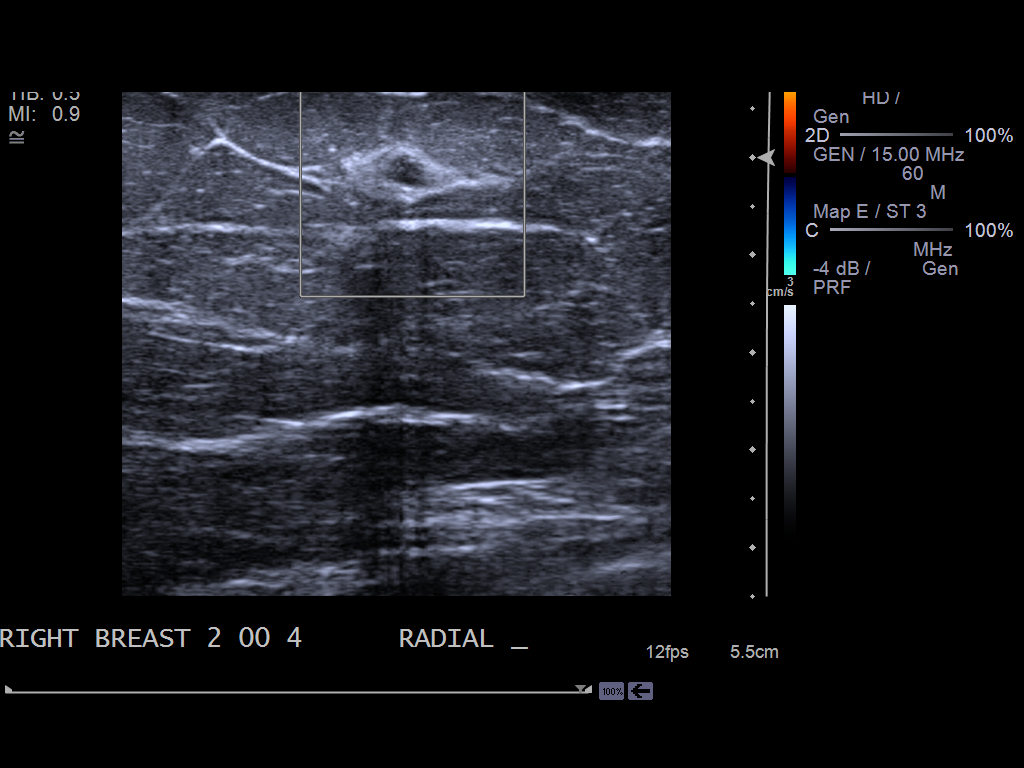

[5 of 5 positions shown; findings below may reference images not displayed]

ACR Breast Density Category b: There are scattered areas of
fibroglandular density.
FINDINGS: On additional views, there is a fat containing asymmetry within the
medial right breast.

Targeted physical exam of the medial right breast demonstrates no
discrete mass.

Targeted ultrasound of the right breast demonstrates an oval area of
hyperechogenicity with central hypoechogenicity measuring 13 x 7 x 8
mm. There is no internal vascularity.

The patient reports having had a clothing SAMPEUR fall onto her breasts
2 months ago with subsequent bruising. The bruising has since
resolved. Findings are thought most consistent with fat necrosis.
IMPRESSION: Probably benign right breast finding.

RECOMMENDATION:
Diagnostic right mammogram and possible right breast ultrasound in 6
months.

I have discussed the findings and recommendations with the patient.
Results were also provided in writing at the conclusion of the
visit. If applicable, a reminder letter will be sent to the patient
regarding the next appointment.

BI-RADS CATEGORY  3: Probably benign.

## 2014-10-02 DIAGNOSIS — Z6829 Body mass index (BMI) 29.0-29.9, adult: Secondary | ICD-10-CM | POA: Diagnosis not present

## 2014-10-02 DIAGNOSIS — M25522 Pain in left elbow: Secondary | ICD-10-CM | POA: Diagnosis not present

## 2014-10-02 DIAGNOSIS — M810 Age-related osteoporosis without current pathological fracture: Secondary | ICD-10-CM | POA: Diagnosis not present

## 2014-10-17 DIAGNOSIS — M810 Age-related osteoporosis without current pathological fracture: Secondary | ICD-10-CM | POA: Diagnosis not present

## 2014-10-17 DIAGNOSIS — Z6829 Body mass index (BMI) 29.0-29.9, adult: Secondary | ICD-10-CM | POA: Diagnosis not present

## 2014-10-19 ENCOUNTER — Encounter (HOSPITAL_COMMUNITY): Payer: Self-pay | Admitting: Cardiovascular Disease

## 2014-11-20 ENCOUNTER — Other Ambulatory Visit: Payer: Self-pay | Admitting: *Deleted

## 2014-11-20 MED ORDER — NITROGLYCERIN 0.4 MG SL SUBL: 0.4000 mg | SUBLINGUAL_TABLET | SUBLINGUAL | Status: DC | PRN

## 2015-02-05 DIAGNOSIS — I2510 Atherosclerotic heart disease of native coronary artery without angina pectoris: Secondary | ICD-10-CM | POA: Diagnosis not present

## 2015-02-05 DIAGNOSIS — M810 Age-related osteoporosis without current pathological fracture: Secondary | ICD-10-CM | POA: Diagnosis not present

## 2015-02-05 DIAGNOSIS — I10 Essential (primary) hypertension: Secondary | ICD-10-CM | POA: Diagnosis not present

## 2015-02-05 DIAGNOSIS — E785 Hyperlipidemia, unspecified: Secondary | ICD-10-CM | POA: Diagnosis not present

## 2015-02-05 DIAGNOSIS — R8299 Other abnormal findings in urine: Secondary | ICD-10-CM | POA: Diagnosis not present

## 2015-02-05 DIAGNOSIS — R8290 Unspecified abnormal findings in urine: Secondary | ICD-10-CM | POA: Diagnosis not present

## 2015-02-07 DIAGNOSIS — Z1212 Encounter for screening for malignant neoplasm of rectum: Secondary | ICD-10-CM | POA: Diagnosis not present

## 2015-02-08 DIAGNOSIS — Z0000 Encounter for general adult medical examination without abnormal findings: Secondary | ICD-10-CM | POA: Diagnosis not present

## 2015-02-08 DIAGNOSIS — N952 Postmenopausal atrophic vaginitis: Secondary | ICD-10-CM | POA: Diagnosis not present

## 2015-02-08 DIAGNOSIS — I213 ST elevation (STEMI) myocardial infarction of unspecified site: Secondary | ICD-10-CM | POA: Diagnosis not present

## 2015-02-08 DIAGNOSIS — K219 Gastro-esophageal reflux disease without esophagitis: Secondary | ICD-10-CM | POA: Diagnosis not present

## 2015-02-08 DIAGNOSIS — Z6830 Body mass index (BMI) 30.0-30.9, adult: Secondary | ICD-10-CM | POA: Diagnosis not present

## 2015-02-08 DIAGNOSIS — E785 Hyperlipidemia, unspecified: Secondary | ICD-10-CM | POA: Diagnosis not present

## 2015-02-08 DIAGNOSIS — M810 Age-related osteoporosis without current pathological fracture: Secondary | ICD-10-CM | POA: Diagnosis not present

## 2015-02-08 DIAGNOSIS — N941 Dyspareunia: Secondary | ICD-10-CM | POA: Diagnosis not present

## 2015-02-08 DIAGNOSIS — I2510 Atherosclerotic heart disease of native coronary artery without angina pectoris: Secondary | ICD-10-CM | POA: Diagnosis not present

## 2015-02-08 DIAGNOSIS — F329 Major depressive disorder, single episode, unspecified: Secondary | ICD-10-CM | POA: Diagnosis not present

## 2015-02-08 DIAGNOSIS — I10 Essential (primary) hypertension: Secondary | ICD-10-CM | POA: Diagnosis not present

## 2015-02-08 DIAGNOSIS — Z1389 Encounter for screening for other disorder: Secondary | ICD-10-CM | POA: Diagnosis not present

## 2015-02-25 ENCOUNTER — Other Ambulatory Visit: Payer: Self-pay | Admitting: Cardiovascular Disease

## 2015-03-02 DIAGNOSIS — H35349 Macular cyst, hole, or pseudohole, unspecified eye: Secondary | ICD-10-CM | POA: Diagnosis not present

## 2015-03-02 NOTE — Op Note (Signed)
PATIENT NAME:  [name], Samantha King MR#:  [id number] DATE OF BIRTH:  Nov 02, 1946  DATE OF PROCEDURE:  10/19/2013  PROCEDURES PERFORMED: Phacoemulsification with intraocular lens insertion of the right eye.   PREOPERATIVE DIAGNOSIS: Visually significant nuclear sclerotic cataract.   POSTOPERATIVE DIAGNOSIS: Visually significant nuclear sclerotic cataract.   ESTIMATED BLOOD LOSS: Less than 1 mL.  PRIMARY SURGEON: Coralee Rud, M.D.   ANESTHESIA: Retrobulbar block of the right eye with monitored anesthesia care.   INDICATION FOR PROCEDURE: This is a patient who underwent vitrectomy in the right eye several months ago. The patient has seen a steady decrease in vision with vision worse that 20/50 in the eye and a visually significant cataract. The patient has noted difficulty with reading and watching television and driving. Risks, benefits, and alternatives of the above procedure were discussed and the patient wished to proceed.   DETAILS OF PROCEDURE: After informed consent was obtained, the patient was bought to the operative suite at [facility]. The patient was placed in supine position, given a small dose of Alfenta and a retrobulbar block was performed on the right eye by the primary surgeon without any complications. The right eye was prepped and draped in sterile manner. After lid speculum was inserted, a side-port wound was created at approximately 10:30. DisCoVisc was injected into the anterior chamber. A main corneal wound was created at approximately 12-o'clock. Cystotome was introduced in the eye and a continuous 360 degree anterior capsulorrhexis was created. The lens was hydrodissected using BSS on a 26-gauge cannula and the lens was rotated for 180 degrees. The lens was broken into 4 quadrants and removed with the phacoemulsification wand. I and A was utilized to remove the remnant cortical material and DisCoVisc was injected into the capsular bag. A  21.5-diopter, Tecnis ZCB00 lens,  serial #[id number] was injected into the capsular bag and rotated into position. The DisCoVisc was removed using I and A and the lens seated centrally. The wounds were hydrated using BSS. Cefuroxime 0.2 mL was injected into the anterior chamber and the pressure in the eye was confirmed to be approximately 15 mmHg. The wounds were noted to be watertight. The lid speculum was removed and the eye was cleaned. TobraDex was placed in the eye and a patch and shield were placed over the eye. The patient was taken to postanesthesia care with instructions to remain head up.  ____________________________ Teresa Pelton. Starling Manns, MD mfa:aw D: 10/19/2013 10:39:24 ET T: 10/19/2013 11:08:03 ET JOB#: [id number]  cc: Teresa Pelton. Starling Manns, MD, <Dictator> Coralee Rud MD ELECTRONICALLY SIGNED 11/09/2013 7:16

## 2015-03-02 NOTE — Op Note (Signed)
PATIENT NAME:  Samantha King, Samantha King MR#:  [id number] DATE OF BIRTH:  [birth year]  DATE OF PROCEDURE:  01/26/2013  PROCEDURES PERFORMED:  1. Pars plana vitrectomy of the right eye.  2. Internal limiting membrane peel of the right eye.  3. Gas exchange of the right eye.   PREOPERATIVE DIAGNOSES:  1. Full-thickness macular hole of the right eye.   POSTOPERATIVE DIAGNOSIS:  2. Full-thickness macular hole of the right eye.   ESTIMATED BLOOD LOSS: Less than 1 mL.   PRIMARY SURGEON: Teresa Pelton. Jovanie Verge, MD   ANESTHESIA: Peribulbar block of the right eye with monitored anesthesia care.   COMPLICATIONS: None.   INDICATIONS FOR PROCEDURE: This patient presented to my office with sudden loss of vision in the right eye. Examination revealed a full-thickness macular hole. Risks, benefits and alternatives of the above procedure were discussed, and the patient wished to proceed.   DETAILS OF PROCEDURE: After informed consent was obtained, the patient was brought into the operative suite at [facility]. The patient was placed in the supine position and was given multiple doses of tetracaine to the right eye. The right eye was prepped and draped in sterile manner. After lid speculum was inserted, more tetracaine was placed onto the eye. A conjunctival incision was created inferonasally. A sub-tenon tract was created through this space. Lidocaine was given as a peribulbar block through this tract. A few minutes was allowed to elapse for anesthesia. A 25-gauge trocar was then placed 4 mm beyond the limbus in the inferotemporal quadrant through the displaced conjunctiva in an oblique fashion. The infusion cannula was turned on and inserted through the trocar and secured in position with Steri-Strips. Two more trocars were placed in a similar fashion superotemporally and superonasally. The vitreous cutter and light pipe were introduced into the eye, and a core vitrectomy was performed. The  vitreous base was elevated off of the retina using suction. The vitreous face was then removed. Peripheral vitreous was trimmed for 360 degrees, taking care to not touch the posterior of the crystalline lens. Indocyanine green was injected onto the posterior pole and removed within 30 seconds. An internal limiting membrane peel was completed for 360 degrees around the fovea for a total diameter of 2 disk diameters. Careful scleral depressed exam was performed x2 for 360 degrees. No signs of any breaks, tears or retinal detachment could be identified. An air-fluid exchange was performed. Four minutes was allowed to elapse for dehydration of the vitreous base. This remnant fluid was removed. Then, 22% SF6 was used as an Acupuncturist, and the trocars were removed. The wounds were noted to be airtight. Pressure in the eye was confirmed to be approximately 15 mmHg. Dexamethasone 5 mg was given into the inferior fornix. The lid speculum was removed. The eye was cleaned, and TobraDex was placed in the eye. A patch and shield were placed over the eye, and the patient was taken to postanesthesia care with instruction to remain face down.   ____________________________ Teresa Pelton. Starling Manns, MD mfa:OSi D: 01/26/2013 09:12:21 ET T: 01/26/2013 09:20:07 ET JOB#: [id number]  cc: Teresa Pelton. Starling Manns, MD, <Dictator> Coralee Rud MD ELECTRONICALLY SIGNED 02/09/2013 8:59

## 2015-04-16 DIAGNOSIS — N952 Postmenopausal atrophic vaginitis: Secondary | ICD-10-CM | POA: Diagnosis not present

## 2015-04-16 DIAGNOSIS — N941 Dyspareunia: Secondary | ICD-10-CM | POA: Diagnosis not present

## 2015-04-16 DIAGNOSIS — Z6829 Body mass index (BMI) 29.0-29.9, adult: Secondary | ICD-10-CM | POA: Diagnosis not present

## 2015-04-16 DIAGNOSIS — Z01419 Encounter for gynecological examination (general) (routine) without abnormal findings: Secondary | ICD-10-CM | POA: Diagnosis not present

## 2015-06-08 ENCOUNTER — Other Ambulatory Visit: Payer: Self-pay | Admitting: Internal Medicine

## 2015-06-08 DIAGNOSIS — R928 Other abnormal and inconclusive findings on diagnostic imaging of breast: Secondary | ICD-10-CM

## 2015-08-02 ENCOUNTER — Ambulatory Visit (HOSPITAL_COMMUNITY)
Admission: RE | Admit: 2015-08-02 | Discharge: 2015-08-02 | Disposition: A | Discharge status: Home / Self Care | Payer: Medicare Other | Source: Ambulatory Visit | Attending: Internal Medicine | Admitting: Internal Medicine

## 2015-08-02 DIAGNOSIS — M810 Age-related osteoporosis without current pathological fracture: Secondary | ICD-10-CM | POA: Insufficient documentation

## 2015-08-02 MED ORDER — DENOSUMAB 60 MG/ML ~~LOC~~ SOLN
60.0000 mg | Freq: Once | SUBCUTANEOUS | Status: AC
  Administered 2015-08-02: 60 mg via SUBCUTANEOUS
  Filled 2015-08-02: qty 1

## 2015-08-02 NOTE — Progress Notes (Signed)
Uneventful first injection of PROLIA at Short Stay. Pt last injection was in the office 10/17/14 and she states she had no problems. Pt was discharged ambulatory unaccompanied to elevator to go hom

## 2015-08-02 NOTE — Discharge Instructions (Signed)
PROLIA °Denosumab injection °What is this medicine? °DENOSUMAB (den oh sue mab) slows bone breakdown. Prolia is used to treat osteoporosis in women after menopause and in men. Xgeva is used to prevent bone fractures and other bone problems caused by cancer bone metastases. Xgeva is also used to treat giant cell tumor of the bone. °This medicine may be used for other purposes; ask your health care provider or pharmacist if you have questions. °COMMON BRAND NAME(S): Prolia, XGEVA °What should I tell my health care provider before I take this medicine? °They need to know if you have any of these conditions: °-dental disease °-eczema °-infection or history of infections °-kidney disease or on dialysis °-low blood calcium or vitamin D °-malabsorption syndrome °-scheduled to have surgery or tooth extraction °-taking medicine that contains denosumab °-thyroid or parathyroid disease °-an unusual reaction to denosumab, other medicines, foods, dyes, or preservatives °-pregnant or trying to get pregnant °-breast-feeding °How should I use this medicine? °This medicine is for injection under the skin. It is given by a health care professional in a hospital or clinic setting. °If you are getting Prolia, a special MedGuide will be given to you by the pharmacist with each prescription and refill. Be sure to read this information carefully each time. °For Prolia, talk to your pediatrician regarding the use of this medicine in children. Special care may be needed. For Xgeva, talk to your pediatrician regarding the use of this medicine in children. While this drug may be prescribed for children as young as 13 years for selected conditions, precautions do apply. °Overdosage: If you think you've taken too much of this medicine contact a poison control center or emergency room at once. °Overdosage: If you think you have taken too much of this medicine contact a poison control center or emergency room at once. °NOTE: This medicine is only  for you. Do not share this medicine with others. °What if I miss a dose? °It is important not to miss your dose. Call your doctor or health care professional if you are unable to keep an appointment. °What may interact with this medicine? °Do not take this medicine with any of the following medications: °-other medicines containing denosumab °This medicine may also interact with the following medications: °-medicines that suppress the immune system °-medicines that treat cancer °-steroid medicines like prednisone or cortisone °This list may not describe all possible interactions. Give your health care provider a list of all the medicines, herbs, non-prescription drugs, or dietary supplements you use. Also tell them if you smoke, drink alcohol, or use illegal drugs. Some items may interact with your medicine. °What should I watch for while using this medicine? °Visit your doctor or health care professional for regular checks on your progress. Your doctor or health care professional may order blood tests and other tests to see how you are doing. °Call your doctor or health care professional if you get a cold or other infection while receiving this medicine. Do not treat yourself. This medicine may decrease your body's ability to fight infection. °You should make sure you get enough calcium and vitamin D while you are taking this medicine, unless your doctor tells you not to. Discuss the foods you eat and the vitamins you take with your health care professional. °See your dentist regularly. Brush and floss your teeth as directed. Before you have any dental work done, tell your dentist you are receiving this medicine. °Do not become pregnant while taking this medicine or for 5 months after   stopping it. Women should inform their doctor if they wish to become pregnant or think they might be pregnant. There is a potential for serious side effects to an unborn child. Talk to your health care professional or pharmacist for  more information. °What side effects may I notice from receiving this medicine? °Side effects that you should report to your doctor or health care professional as soon as possible: °-allergic reactions like skin rash, itching or hives, swelling of the face, lips, or tongue °-breathing problems °-chest pain °-fast, irregular heartbeat °-feeling faint or lightheaded, falls °-fever, chills, or any other sign of infection °-muscle spasms, tightening, or twitches °-numbness or tingling °-skin blisters or bumps, or is dry, peels, or red °-slow healing or unexplained pain in the mouth or jaw °-unusual bleeding or bruising °Side effects that usually do not require medical attention (Report these to your doctor or health care professional if they continue or are bothersome.): °-muscle pain °-stomach upset, gas °This list may not describe all possible side effects. Call your doctor for medical advice about side effects. You may report side effects to FDA at 1-800-FDA-1088. °Where should I keep my medicine? °This medicine is only given in a clinic, doctor's office, or other health care setting and will not be stored at home. °NOTE: This sheet is a summary. It may not cover all possible information. If you have questions about this medicine, talk to your doctor, pharmacist, or health care provider. °© 2015, Elsevier/Gold Standard. (2012-04-26 12:37:47) °Osteoporosis °Throughout your life, your body breaks down old bone and replaces it with new bone. As you get older, your body does not replace bone as quickly as it breaks it down. By the age of 30 years, most people begin to gradually lose bone because of the imbalance between bone loss and replacement. Some people lose more bone than others. Bone loss beyond a specified normal degree is considered osteoporosis.  °Osteoporosis affects the strength and durability of your bones. The inside of the ends of your bones and your flat bones, like the bones of your pelvis, look like  honeycomb, filled with tiny open spaces. As bone loss occurs, your bones become less dense. This means that the open spaces inside your bones become bigger and the walls between these spaces become thinner. This makes your bones weaker. Bones of a person with osteoporosis can become so weak that they can break (fracture) during minor accidents, such as a simple fall. °CAUSES  °The following factors have been associated with the development of osteoporosis: °· Smoking. °· Drinking more than 2 alcoholic drinks several days per week. °· Long-term use of certain medicines: °¨ Corticosteroids. °¨ Chemotherapy medicines. °¨ Thyroid medicines. °¨ Antiepileptic medicines. °¨ Gonadal hormone suppression medicine. °¨ Immunosuppression medicine. °· Being underweight. °· Lack of physical activity. °· Lack of exposure to the sun. This can lead to vitamin D deficiency. °· Certain medical conditions: °¨ Certain inflammatory bowel diseases, such as Crohn disease and ulcerative colitis. °¨ Diabetes. °¨ Hyperthyroidism. °¨ Hyperparathyroidism. °RISK FACTORS °Anyone can develop osteoporosis. However, the following factors can increase your risk of developing osteoporosis: °· Gender--Women are at higher risk than men. °· Age--Being older than 50 years increases your risk. °· Ethnicity--White and Asian people have an increased risk. °· Weight --Being extremely underweight can increase your risk of osteoporosis. °· Family history of osteoporosis--Having a family member who has developed osteoporosis can increase your risk. °SYMPTOMS  °Usually, people with osteoporosis have no symptoms.  °DIAGNOSIS  °Signs during   a physical exam that may prompt your caregiver to suspect osteoporosis include: °· Decreased height. This is usually caused by the compression of the bones that form your spine (vertebrae) because they have weakened and become fractured. °· A curving or rounding of the upper back (kyphosis). °To confirm signs of osteoporosis,  your caregiver may request a procedure that uses 2 low-dose X-ray beams with different levels of energy to measure your bone mineral density (dual-energy X-ray absorptiometry [DXA]). Also, your caregiver may check your level of vitamin D. °TREATMENT  °The goal of osteoporosis treatment is to strengthen bones in order to decrease the risk of bone fractures. There are different types of medicines available to help achieve this goal. Some of these medicines work by slowing the processes of bone loss. Some medicines work by increasing bone density. Treatment also involves making sure that your levels of calcium and vitamin D are adequate. °PREVENTION  °There are things you can do to help prevent osteoporosis. Adequate intake of calcium and vitamin D can help you achieve optimal bone mineral density. Regular exercise can also help, especially resistance and weight-bearing activities. If you smoke, quitting smoking is an important part of osteoporosis prevention. °MAKE SURE YOU: °· Understand these instructions. °· Will watch your condition. °· Will get help right away if you are not doing well or get worse. °FOR MORE INFORMATION °www.osteo.org and www.nof.org °Document Released: 08/06/2005 Document Revised: 02/21/2013 Document Reviewed: 10/11/2011 °ExitCare® Patient Information ©2015 [employer]. This information is not intended to replace advice given to you by your health care provider. Make sure you discuss any questions you have with your health care provider. ° ° °

## 2015-08-07 DIAGNOSIS — I213 ST elevation (STEMI) myocardial infarction of unspecified site: Secondary | ICD-10-CM | POA: Diagnosis not present

## 2015-08-07 DIAGNOSIS — F331 Major depressive disorder, recurrent, moderate: Secondary | ICD-10-CM | POA: Diagnosis not present

## 2015-08-07 DIAGNOSIS — Z23 Encounter for immunization: Secondary | ICD-10-CM | POA: Diagnosis not present

## 2015-08-07 DIAGNOSIS — I2510 Atherosclerotic heart disease of native coronary artery without angina pectoris: Secondary | ICD-10-CM | POA: Diagnosis not present

## 2015-08-07 DIAGNOSIS — M810 Age-related osteoporosis without current pathological fracture: Secondary | ICD-10-CM | POA: Diagnosis not present

## 2015-08-07 DIAGNOSIS — Z6827 Body mass index (BMI) 27.0-27.9, adult: Secondary | ICD-10-CM | POA: Diagnosis not present

## 2015-08-07 DIAGNOSIS — I10 Essential (primary) hypertension: Secondary | ICD-10-CM | POA: Diagnosis not present

## 2015-08-07 DIAGNOSIS — E785 Hyperlipidemia, unspecified: Secondary | ICD-10-CM | POA: Diagnosis not present

## 2015-08-07 DIAGNOSIS — K219 Gastro-esophageal reflux disease without esophagitis: Secondary | ICD-10-CM | POA: Diagnosis not present

## 2015-09-21 ENCOUNTER — Other Ambulatory Visit: Payer: Self-pay | Admitting: Cardiovascular Disease

## 2015-09-24 ENCOUNTER — Other Ambulatory Visit: Payer: Self-pay | Admitting: Cardiovascular Disease

## 2015-10-21 DIAGNOSIS — R05 Cough: Secondary | ICD-10-CM | POA: Diagnosis not present

## 2015-10-21 DIAGNOSIS — J019 Acute sinusitis, unspecified: Secondary | ICD-10-CM | POA: Diagnosis not present

## 2015-10-21 DIAGNOSIS — J3089 Other allergic rhinitis: Secondary | ICD-10-CM | POA: Diagnosis not present

## 2015-10-22 ENCOUNTER — Other Ambulatory Visit: Payer: Self-pay | Admitting: Cardiovascular Disease

## 2015-10-29 ENCOUNTER — Encounter: Payer: Self-pay | Admitting: Cardiovascular Disease

## 2015-11-07 ENCOUNTER — Other Ambulatory Visit: Payer: Self-pay | Admitting: *Deleted

## 2015-11-07 MED ORDER — PANTOPRAZOLE SODIUM 40 MG PO TBEC: DELAYED_RELEASE_TABLET | ORAL | Status: DC

## 2015-11-08 ENCOUNTER — Other Ambulatory Visit: Payer: Self-pay | Admitting: *Deleted

## 2015-11-08 MED ORDER — CLOPIDOGREL BISULFATE 75 MG PO TABS: 75.0000 mg | ORAL_TABLET | Freq: Every day | ORAL | Status: DC
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
## 2015-11-29 ENCOUNTER — Ambulatory Visit (INDEPENDENT_AMBULATORY_CARE_PROVIDER_SITE_OTHER): Payer: Medicare Other | Admitting: Cardiovascular Disease

## 2015-11-29 ENCOUNTER — Encounter: Payer: Self-pay | Admitting: Cardiovascular Disease

## 2015-11-29 VITALS — BP 110/80 | HR 60 | Ht 64.0 in | Wt 154.0 lb

## 2015-11-29 DIAGNOSIS — I2510 Atherosclerotic heart disease of native coronary artery without angina pectoris: Secondary | ICD-10-CM

## 2015-11-29 DIAGNOSIS — I10 Essential (primary) hypertension: Secondary | ICD-10-CM | POA: Diagnosis not present

## 2015-11-29 DIAGNOSIS — E785 Hyperlipidemia, unspecified: Secondary | ICD-10-CM | POA: Diagnosis not present

## 2015-11-29 NOTE — Patient Instructions (Signed)

## 2015-12-07 ENCOUNTER — Other Ambulatory Visit: Payer: Self-pay | Admitting: *Deleted

## 2015-12-07 MED ORDER — PANTOPRAZOLE SODIUM 40 MG PO TBEC: DELAYED_RELEASE_TABLET | ORAL | Status: DC

## 2015-12-07 MED ORDER — CLOPIDOGREL BISULFATE 75 MG PO TABS: 75.0000 mg | ORAL_TABLET | Freq: Every day | ORAL | Status: DC

## 2016-01-31 ENCOUNTER — Ambulatory Visit (HOSPITAL_COMMUNITY)
Admission: RE | Admit: 2016-01-31 | Discharge: 2016-01-31 | Disposition: A | Discharge status: Home / Self Care | Payer: Medicare Other | Source: Ambulatory Visit | Attending: Internal Medicine | Admitting: Internal Medicine

## 2016-01-31 ENCOUNTER — Encounter (HOSPITAL_COMMUNITY): Payer: Self-pay

## 2016-01-31 DIAGNOSIS — M1990 Unspecified osteoarthritis, unspecified site: Secondary | ICD-10-CM | POA: Diagnosis not present

## 2016-01-31 MED ORDER — DENOSUMAB 60 MG/ML ~~LOC~~ SOLN
60.0000 mg | Freq: Once | SUBCUTANEOUS | Status: AC
  Administered 2016-01-31: 60 mg via SUBCUTANEOUS
  Filled 2016-01-31: qty 1

## 2016-01-31 NOTE — Discharge Instructions (Signed)
Prolia °Denosumab injection °What is this medicine? °DENOSUMAB (den oh sue mab) slows bone breakdown. Prolia is used to treat osteoporosis in women after menopause and in men. Xgeva is used to prevent bone fractures and other bone problems caused by cancer bone metastases. Xgeva is also used to treat giant cell tumor of the bone. °This medicine may be used for other purposes; ask your health care provider or pharmacist if you have questions. °What should I tell my health care provider before I take this medicine? °They need to know if you have any of these conditions: °-dental disease °-eczema °-infection or history of infections °-kidney disease or on dialysis °-low blood calcium or vitamin D °-malabsorption syndrome °-scheduled to have surgery or tooth extraction °-taking medicine that contains denosumab °-thyroid or parathyroid disease °-an unusual reaction to denosumab, other medicines, foods, dyes, or preservatives °-pregnant or trying to get pregnant °-breast-feeding °How should I use this medicine? °This medicine is for injection under the skin. It is given by a health care professional in a hospital or clinic setting. °If you are getting Prolia, a special MedGuide will be given to you by the pharmacist with each prescription and refill. Be sure to read this information carefully each time. °For Prolia, talk to your pediatrician regarding the use of this medicine in children. Special care may be needed. For Xgeva, talk to your pediatrician regarding the use of this medicine in children. While this drug may be prescribed for children as young as 13 years for selected conditions, precautions do apply. °Overdosage: If you think you have taken too much of this medicine contact a poison control center or emergency room at once. °NOTE: This medicine is only for you. Do not share this medicine with others. °What if I miss a dose? °It is important not to miss your dose. Call your doctor or health care professional  if you are unable to keep an appointment. °What may interact with this medicine? °Do not take this medicine with any of the following medications: °-other medicines containing denosumab °This medicine may also interact with the following medications: °-medicines that suppress the immune system °-medicines that treat cancer °-steroid medicines like prednisone or cortisone °This list may not describe all possible interactions. Give your health care provider a list of all the medicines, herbs, non-prescription drugs, or dietary supplements you use. Also tell them if you smoke, drink alcohol, or use illegal drugs. Some items may interact with your medicine. °What should I watch for while using this medicine? °Visit your doctor or health care professional for regular checks on your progress. Your doctor or health care professional may order blood tests and other tests to see how you are doing. °Call your doctor or health care professional if you get a cold or other infection while receiving this medicine. Do not treat yourself. This medicine may decrease your body's ability to fight infection. °You should make sure you get enough calcium and vitamin D while you are taking this medicine, unless your doctor tells you not to. Discuss the foods you eat and the vitamins you take with your health care professional. °See your dentist regularly. Brush and floss your teeth as directed. Before you have any dental work done, tell your dentist you are receiving this medicine. °Do not become pregnant while taking this medicine or for 5 months after stopping it. Women should inform their doctor if they wish to become pregnant or think they might be pregnant. There is a potential for serious side   effects to an unborn child. Talk to your health care professional or pharmacist for more information. °What side effects may I notice from receiving this medicine? °Side effects that you should report to your doctor or health care professional  as soon as possible: °-allergic reactions like skin rash, itching or hives, swelling of the face, lips, or tongue °-breathing problems °-chest pain °-fast, irregular heartbeat °-feeling faint or lightheaded, falls °-fever, chills, or any other sign of infection °-muscle spasms, tightening, or twitches °-numbness or tingling °-skin blisters or bumps, or is dry, peels, or red °-slow healing or unexplained pain in the mouth or jaw °-unusual bleeding or bruising °Side effects that usually do not require medical attention (Report these to your doctor or health care professional if they continue or are bothersome.): °-muscle pain °-stomach upset, gas °This list may not describe all possible side effects. Call your doctor for medical advice about side effects. You may report side effects to FDA at 1-800-FDA-1088. °Where should I keep my medicine? °This medicine is only given in a clinic, doctor's office, or other health care setting and will not be stored at home. °NOTE: This sheet is a summary. It may not cover all possible information. If you have questions about this medicine, talk to your doctor, pharmacist, or health care provider. °  °© 2016, [employer]vier/Gold Standard. (2012-04-26 12:37:47) ° °

## 2016-02-15 DIAGNOSIS — J019 Acute sinusitis, unspecified: Secondary | ICD-10-CM | POA: Diagnosis not present

## 2016-02-29 DIAGNOSIS — I10 Essential (primary) hypertension: Secondary | ICD-10-CM | POA: Diagnosis not present

## 2016-02-29 DIAGNOSIS — E784 Other hyperlipidemia: Secondary | ICD-10-CM | POA: Diagnosis not present

## 2016-02-29 DIAGNOSIS — N390 Urinary tract infection, site not specified: Secondary | ICD-10-CM | POA: Diagnosis not present

## 2016-02-29 DIAGNOSIS — M810 Age-related osteoporosis without current pathological fracture: Secondary | ICD-10-CM | POA: Diagnosis not present

## 2016-02-29 DIAGNOSIS — R8299 Other abnormal findings in urine: Secondary | ICD-10-CM | POA: Diagnosis not present

## 2016-03-04 DIAGNOSIS — E7800 Pure hypercholesterolemia, unspecified: Secondary | ICD-10-CM | POA: Diagnosis not present

## 2016-03-04 DIAGNOSIS — K219 Gastro-esophageal reflux disease without esophagitis: Secondary | ICD-10-CM | POA: Diagnosis not present

## 2016-03-04 DIAGNOSIS — Z1389 Encounter for screening for other disorder: Secondary | ICD-10-CM | POA: Diagnosis not present

## 2016-03-04 DIAGNOSIS — I2510 Atherosclerotic heart disease of native coronary artery without angina pectoris: Secondary | ICD-10-CM | POA: Diagnosis not present

## 2016-03-04 DIAGNOSIS — I213 ST elevation (STEMI) myocardial infarction of unspecified site: Secondary | ICD-10-CM | POA: Diagnosis not present

## 2016-03-04 DIAGNOSIS — I119 Hypertensive heart disease without heart failure: Secondary | ICD-10-CM | POA: Diagnosis not present

## 2016-03-04 DIAGNOSIS — N952 Postmenopausal atrophic vaginitis: Secondary | ICD-10-CM | POA: Diagnosis not present

## 2016-03-04 DIAGNOSIS — Z0000 Encounter for general adult medical examination without abnormal findings: Secondary | ICD-10-CM | POA: Diagnosis not present

## 2016-03-04 DIAGNOSIS — I10 Essential (primary) hypertension: Secondary | ICD-10-CM | POA: Diagnosis not present

## 2016-03-04 DIAGNOSIS — F331 Major depressive disorder, recurrent, moderate: Secondary | ICD-10-CM | POA: Diagnosis not present

## 2016-03-04 DIAGNOSIS — M810 Age-related osteoporosis without current pathological fracture: Secondary | ICD-10-CM | POA: Diagnosis not present

## 2016-03-04 DIAGNOSIS — Z6826 Body mass index (BMI) 26.0-26.9, adult: Secondary | ICD-10-CM | POA: Diagnosis not present

## 2016-03-04 DIAGNOSIS — R928 Other abnormal and inconclusive findings on diagnostic imaging of breast: Secondary | ICD-10-CM | POA: Diagnosis not present

## 2016-03-20 ENCOUNTER — Other Ambulatory Visit: Payer: Self-pay | Admitting: Cardiovascular Disease

## 2016-04-03 DIAGNOSIS — Z1212 Encounter for screening for malignant neoplasm of rectum: Secondary | ICD-10-CM | POA: Diagnosis not present

## 2016-04-18 DIAGNOSIS — H35341 Macular cyst, hole, or pseudohole, right eye: Secondary | ICD-10-CM | POA: Diagnosis not present

## 2016-04-25 DIAGNOSIS — W57XXXA Bitten or stung by nonvenomous insect and other nonvenomous arthropods, initial encounter: Secondary | ICD-10-CM | POA: Diagnosis not present

## 2016-04-25 DIAGNOSIS — J209 Acute bronchitis, unspecified: Secondary | ICD-10-CM | POA: Diagnosis not present

## 2016-04-25 DIAGNOSIS — Z6827 Body mass index (BMI) 27.0-27.9, adult: Secondary | ICD-10-CM | POA: Diagnosis not present

## 2016-07-26 DIAGNOSIS — Z23 Encounter for immunization: Secondary | ICD-10-CM | POA: Diagnosis not present

## 2016-08-04 ENCOUNTER — Other Ambulatory Visit (HOSPITAL_COMMUNITY): Payer: Self-pay | Admitting: Internal Medicine

## 2016-08-04 ENCOUNTER — Ambulatory Visit (HOSPITAL_COMMUNITY): Admission: RE | Admit: 2016-08-04 | Payer: Medicare Other | Source: Ambulatory Visit

## 2016-09-05 DIAGNOSIS — J302 Other seasonal allergic rhinitis: Secondary | ICD-10-CM | POA: Diagnosis not present

## 2016-09-05 DIAGNOSIS — I119 Hypertensive heart disease without heart failure: Secondary | ICD-10-CM | POA: Diagnosis not present

## 2016-09-05 DIAGNOSIS — R928 Other abnormal and inconclusive findings on diagnostic imaging of breast: Secondary | ICD-10-CM | POA: Diagnosis not present

## 2016-09-05 DIAGNOSIS — Z6826 Body mass index (BMI) 26.0-26.9, adult: Secondary | ICD-10-CM | POA: Diagnosis not present

## 2016-09-05 DIAGNOSIS — E7800 Pure hypercholesterolemia, unspecified: Secondary | ICD-10-CM | POA: Diagnosis not present

## 2016-09-05 DIAGNOSIS — I2510 Atherosclerotic heart disease of native coronary artery without angina pectoris: Secondary | ICD-10-CM | POA: Diagnosis not present

## 2016-09-05 DIAGNOSIS — K219 Gastro-esophageal reflux disease without esophagitis: Secondary | ICD-10-CM | POA: Diagnosis not present

## 2016-09-05 DIAGNOSIS — F331 Major depressive disorder, recurrent, moderate: Secondary | ICD-10-CM | POA: Diagnosis not present

## 2016-09-05 DIAGNOSIS — M810 Age-related osteoporosis without current pathological fracture: Secondary | ICD-10-CM | POA: Diagnosis not present

## 2016-09-05 DIAGNOSIS — I10 Essential (primary) hypertension: Secondary | ICD-10-CM | POA: Diagnosis not present

## 2016-09-24 ENCOUNTER — Encounter (HOSPITAL_COMMUNITY): Payer: Self-pay

## 2016-09-24 ENCOUNTER — Ambulatory Visit (HOSPITAL_COMMUNITY)
Admission: RE | Admit: 2016-09-24 | Discharge: 2016-09-24 | Disposition: A | Discharge status: Home / Self Care | Payer: Medicare Other | Source: Ambulatory Visit | Attending: Internal Medicine | Admitting: Internal Medicine

## 2016-09-24 DIAGNOSIS — M810 Age-related osteoporosis without current pathological fracture: Secondary | ICD-10-CM | POA: Diagnosis not present

## 2016-09-24 MED ORDER — DENOSUMAB 60 MG/ML ~~LOC~~ SOLN
60.0000 mg | Freq: Once | SUBCUTANEOUS | Status: AC
  Administered 2016-09-24: 60 mg via SUBCUTANEOUS
  Filled 2016-09-24: qty 1

## 2016-09-24 NOTE — Progress Notes (Signed)
prolia 60mg  sq given to left upper arm.  D/c instructions given on prolia.  Next appointment for 03/25/16 given to pt.  Pt d/c ambulatory to lobby.

## 2016-12-03 ENCOUNTER — Other Ambulatory Visit: Payer: Self-pay | Admitting: Cardiovascular Disease

## 2016-12-05 ENCOUNTER — Other Ambulatory Visit: Payer: Self-pay | Admitting: Cardiovascular Disease

## 2016-12-08 ENCOUNTER — Ambulatory Visit (INDEPENDENT_AMBULATORY_CARE_PROVIDER_SITE_OTHER): Payer: Medicare Other | Admitting: Cardiovascular Disease

## 2016-12-08 ENCOUNTER — Encounter: Payer: Self-pay | Admitting: Cardiovascular Disease

## 2016-12-08 VITALS — BP 170/104 | HR 69 | Ht 64.0 in | Wt 157.2 lb

## 2016-12-08 DIAGNOSIS — I10 Essential (primary) hypertension: Secondary | ICD-10-CM

## 2016-12-08 DIAGNOSIS — E7800 Pure hypercholesterolemia, unspecified: Secondary | ICD-10-CM | POA: Diagnosis not present

## 2016-12-08 DIAGNOSIS — I2510 Atherosclerotic heart disease of native coronary artery without angina pectoris: Secondary | ICD-10-CM | POA: Diagnosis not present
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
## 2016-12-08 NOTE — Patient Instructions (Signed)

## 2016-12-25 ENCOUNTER — Other Ambulatory Visit: Payer: Self-pay

## 2016-12-25 ENCOUNTER — Ambulatory Visit (HOSPITAL_COMMUNITY): Payer: Medicare Other | Attending: Cardiovascular Disease

## 2016-12-25 DIAGNOSIS — I351 Nonrheumatic aortic (valve) insufficiency: Secondary | ICD-10-CM | POA: Diagnosis not present

## 2016-12-25 DIAGNOSIS — I2510 Atherosclerotic heart disease of native coronary artery without angina pectoris: Secondary | ICD-10-CM

## 2016-12-25 DIAGNOSIS — I501 Left ventricular failure: Secondary | ICD-10-CM | POA: Insufficient documentation

## 2016-12-25 DIAGNOSIS — I340 Nonrheumatic mitral (valve) insufficiency: Secondary | ICD-10-CM | POA: Insufficient documentation

## 2016-12-26 ENCOUNTER — Other Ambulatory Visit: Payer: Self-pay | Admitting: *Deleted

## 2016-12-26 DIAGNOSIS — I2510 Atherosclerotic heart disease of native coronary artery without angina pectoris: Secondary | ICD-10-CM

## 2016-12-29 ENCOUNTER — Ambulatory Visit (HOSPITAL_COMMUNITY): Payer: Medicare Other | Attending: Cardiology

## 2016-12-29 ENCOUNTER — Other Ambulatory Visit: Payer: Self-pay

## 2016-12-29 ENCOUNTER — Other Ambulatory Visit: Payer: Self-pay | Admitting: Cardiovascular Disease

## 2016-12-29 DIAGNOSIS — I501 Left ventricular failure: Secondary | ICD-10-CM | POA: Diagnosis not present

## 2016-12-29 DIAGNOSIS — I2510 Atherosclerotic heart disease of native coronary artery without angina pectoris: Secondary | ICD-10-CM

## 2016-12-29 MED ORDER — PERFLUTREN LIPID MICROSPHERE
1.0000 mL | INTRAVENOUS | Status: AC | PRN
  Administered 2016-12-29: 2 mL via INTRAVENOUS

## 2017-01-06 ENCOUNTER — Other Ambulatory Visit: Payer: Self-pay | Admitting: Cardiovascular Disease

## 2017-03-03 DIAGNOSIS — M810 Age-related osteoporosis without current pathological fracture: Secondary | ICD-10-CM | POA: Diagnosis not present

## 2017-03-03 DIAGNOSIS — I10 Essential (primary) hypertension: Secondary | ICD-10-CM | POA: Diagnosis not present

## 2017-03-03 DIAGNOSIS — E7800 Pure hypercholesterolemia, unspecified: Secondary | ICD-10-CM | POA: Diagnosis not present

## 2017-03-10 DIAGNOSIS — E7800 Pure hypercholesterolemia, unspecified: Secondary | ICD-10-CM | POA: Diagnosis not present

## 2017-03-10 DIAGNOSIS — F331 Major depressive disorder, recurrent, moderate: Secondary | ICD-10-CM | POA: Diagnosis not present

## 2017-03-10 DIAGNOSIS — Z6828 Body mass index (BMI) 28.0-28.9, adult: Secondary | ICD-10-CM | POA: Diagnosis not present

## 2017-03-10 DIAGNOSIS — I213 ST elevation (STEMI) myocardial infarction of unspecified site: Secondary | ICD-10-CM | POA: Diagnosis not present

## 2017-03-10 DIAGNOSIS — J302 Other seasonal allergic rhinitis: Secondary | ICD-10-CM | POA: Diagnosis not present

## 2017-03-10 DIAGNOSIS — I119 Hypertensive heart disease without heart failure: Secondary | ICD-10-CM | POA: Diagnosis not present

## 2017-03-10 DIAGNOSIS — M810 Age-related osteoporosis without current pathological fracture: Secondary | ICD-10-CM | POA: Diagnosis not present

## 2017-03-10 DIAGNOSIS — I2510 Atherosclerotic heart disease of native coronary artery without angina pectoris: Secondary | ICD-10-CM | POA: Diagnosis not present

## 2017-03-10 DIAGNOSIS — Z1389 Encounter for screening for other disorder: Secondary | ICD-10-CM | POA: Diagnosis not present

## 2017-03-10 DIAGNOSIS — K219 Gastro-esophageal reflux disease without esophagitis: Secondary | ICD-10-CM | POA: Diagnosis not present

## 2017-03-10 DIAGNOSIS — Z0000 Encounter for general adult medical examination without abnormal findings: Secondary | ICD-10-CM | POA: Diagnosis not present

## 2017-03-10 DIAGNOSIS — N852 Hypertrophy of uterus: Secondary | ICD-10-CM | POA: Diagnosis not present

## 2017-03-11 ENCOUNTER — Other Ambulatory Visit: Payer: Self-pay | Admitting: Internal Medicine

## 2017-03-11 DIAGNOSIS — N6310 Unspecified lump in the right breast, unspecified quadrant: Secondary | ICD-10-CM

## 2017-03-13 ENCOUNTER — Ambulatory Visit
Admission: RE | Admit: 2017-03-13 | Discharge: 2017-03-13 | Disposition: A | Discharge status: Home / Self Care | Payer: Medicare Other | Source: Ambulatory Visit | Attending: Internal Medicine | Admitting: Internal Medicine

## 2017-03-13 DIAGNOSIS — R928 Other abnormal and inconclusive findings on diagnostic imaging of breast: Secondary | ICD-10-CM | POA: Diagnosis not present

## 2017-03-13 DIAGNOSIS — N6310 Unspecified lump in the right breast, unspecified quadrant: Secondary | ICD-10-CM

## 2017-03-13 DIAGNOSIS — Z1212 Encounter for screening for malignant neoplasm of rectum: Secondary | ICD-10-CM | POA: Diagnosis not present

## 2017-03-13 IMAGING — MG 2D DIGITAL DIAGNOSTIC BILATERAL MAMMOGRAM WITH CAD AND ADJUNCT T
8 of 13 series · 8 of 29 positions shown · non-contrast
Comparison: Previous exam(s).

CLINICAL DATA: Patient dose of short-term follow-up for a probably
benign area of presumed fat necrosis in the right breast. This was
evaluated in [DATE]. This is her first follow-up study.No
current complaints.

EXAM:
2D DIGITAL DIAGNOSTIC BILATERAL MAMMOGRAM WITH CAD AND ADJUNCT TOMO

[R MLO (1 of 2)]
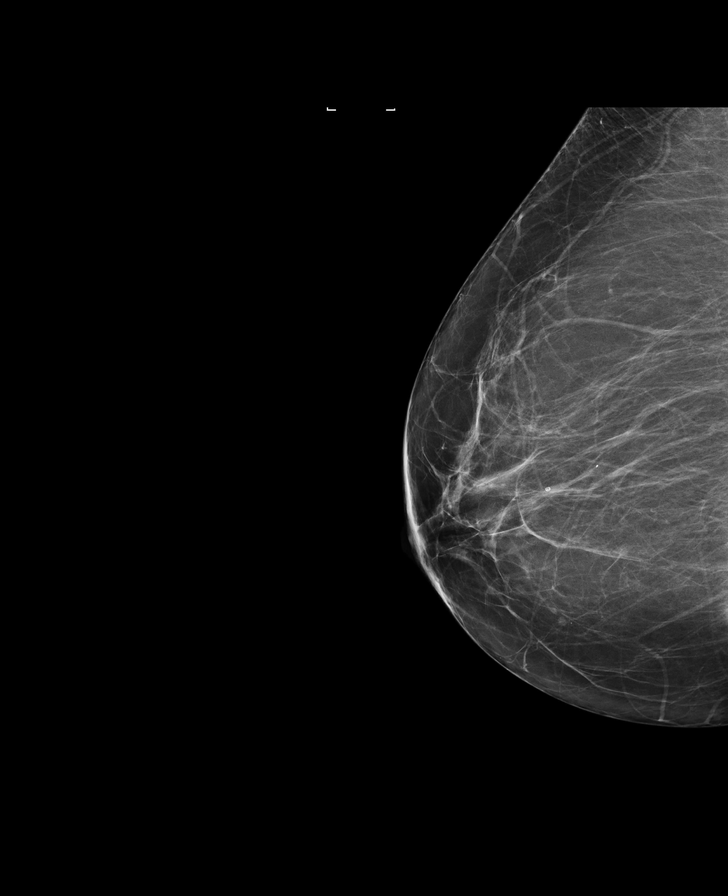

[R CC]
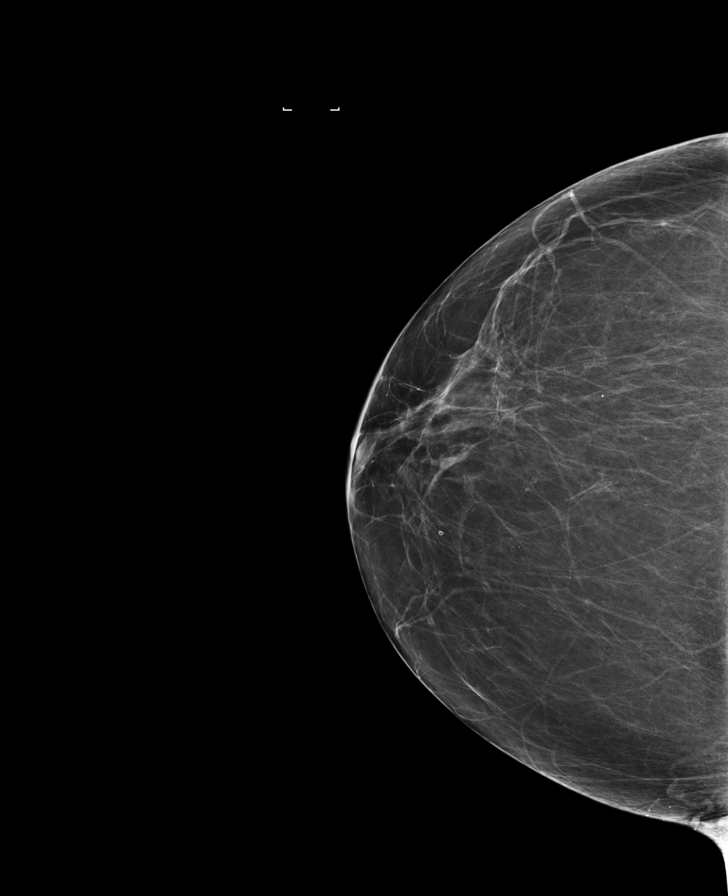

[L CC]
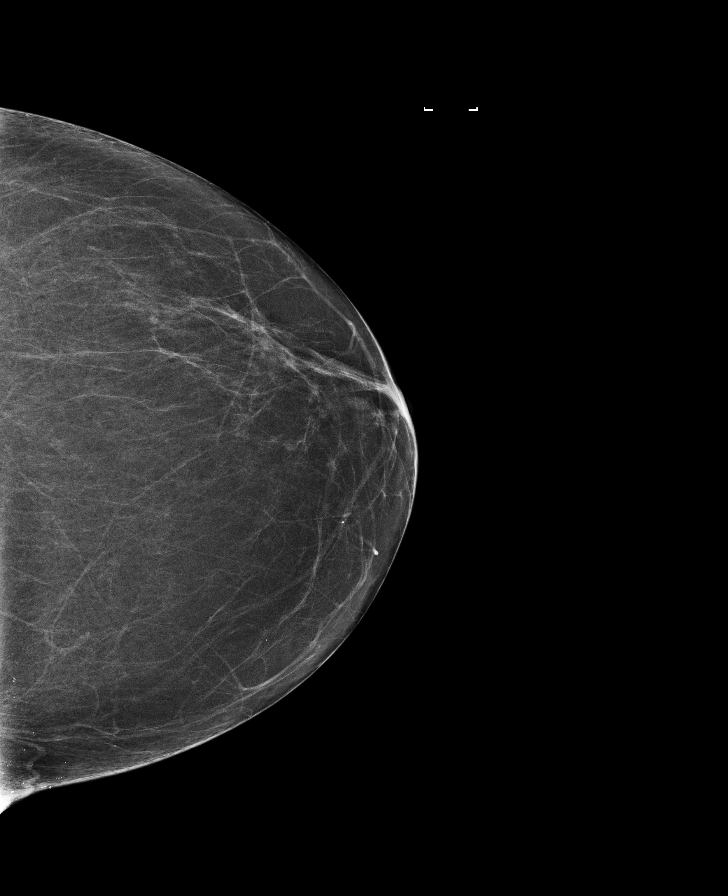

[R CC synth-2D]
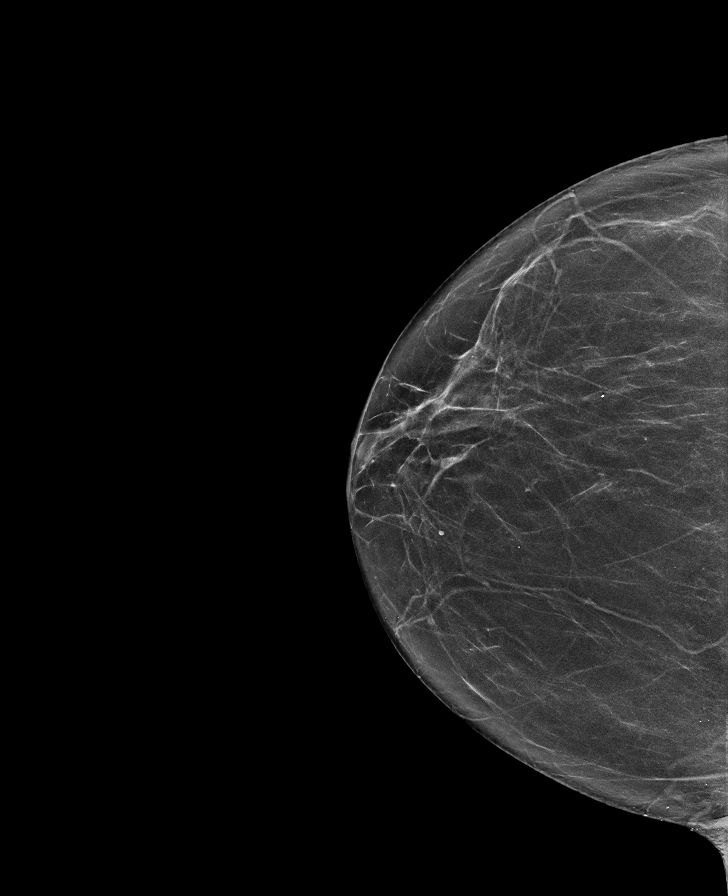

[R MLO synth-2D]
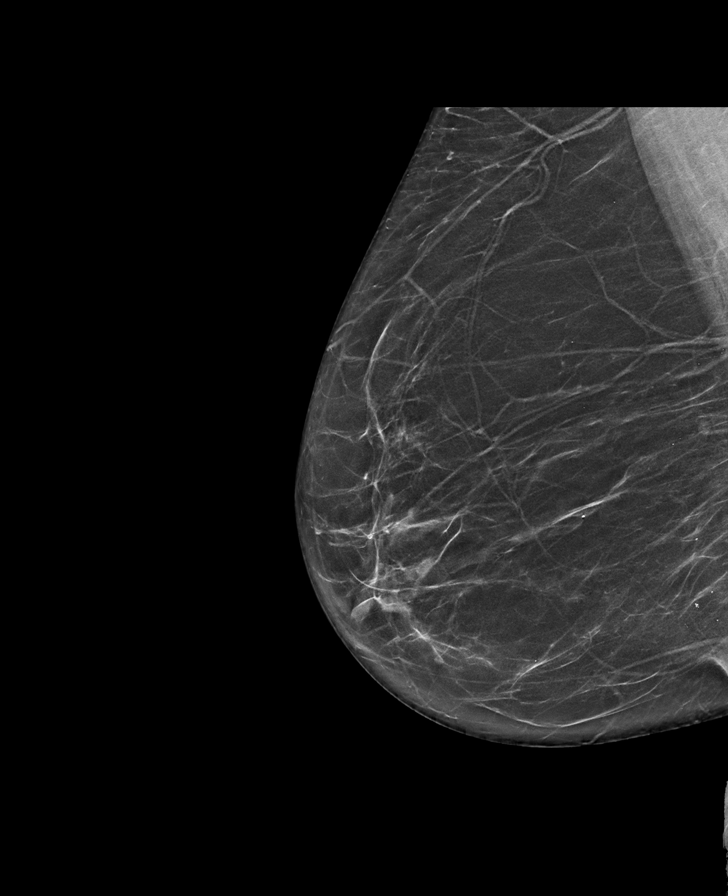

[L CC synth-2D]
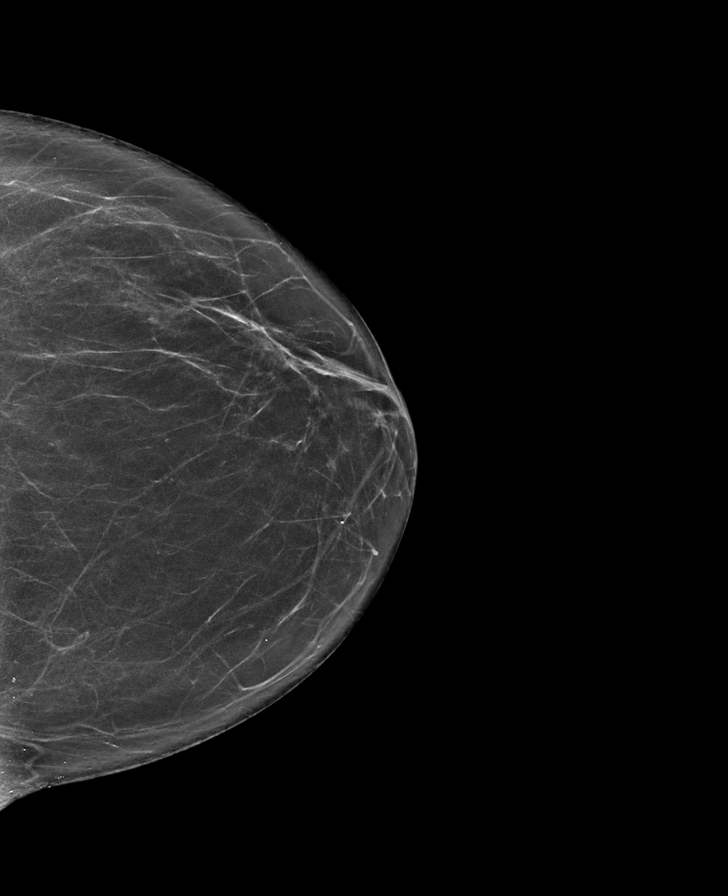

[L MLO synth-2D]
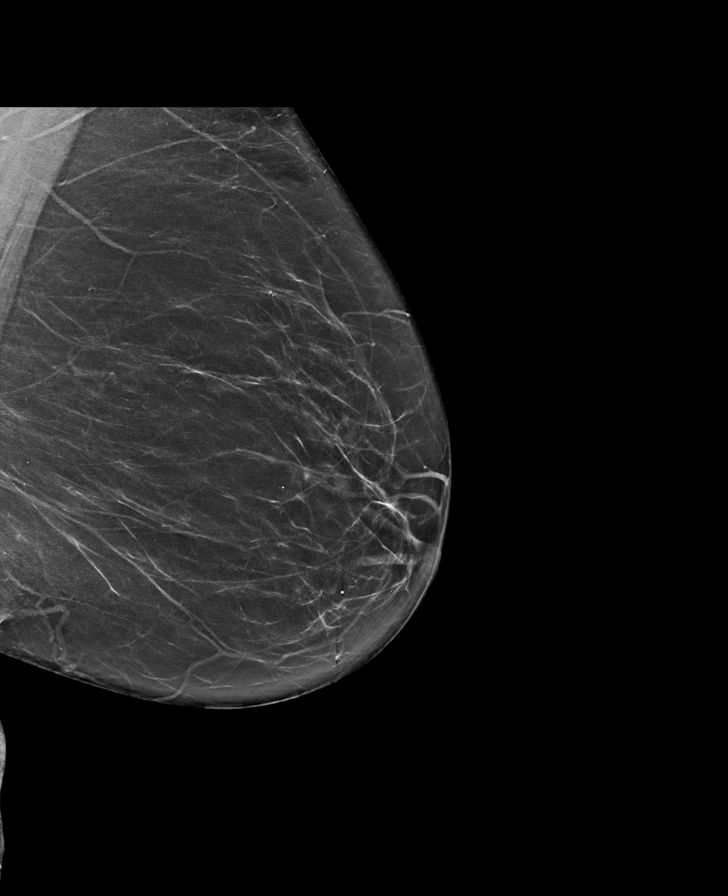

[R MLO (2 of 2)]
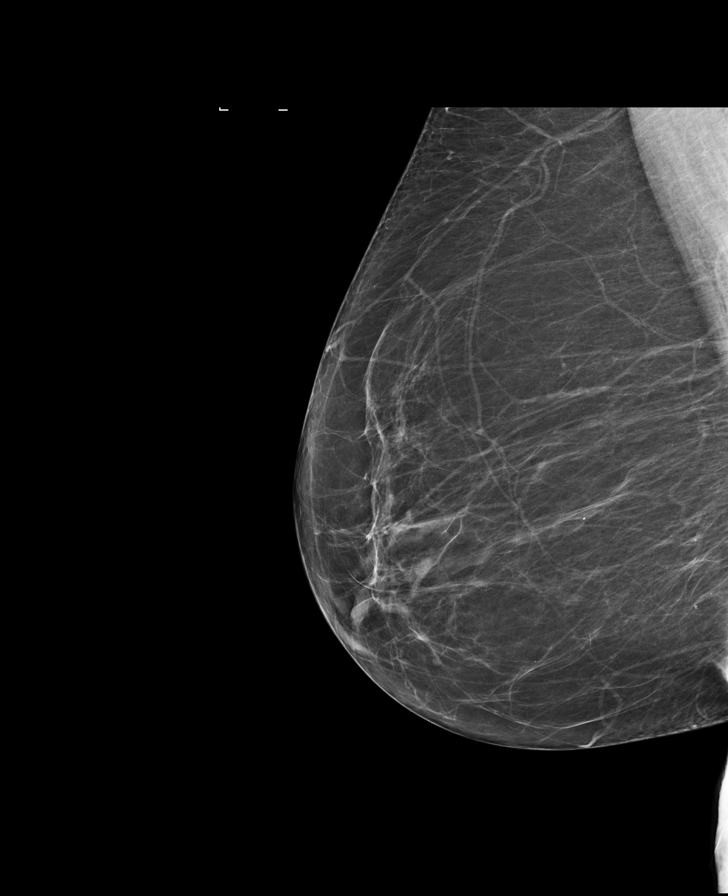

[8 of 29 positions shown; findings below may reference images not displayed]

ACR Breast Density Category b: There are scattered areas of
fibroglandular density.
FINDINGS: The focal opacity medial right breast seen previously has resolved.

There are no discrete masses, areas of asymmetry, areas of
architectural distortion or suspicious calcifications. No other
mammographic change.

Mammographic images were processed with CAD.
IMPRESSION: 1. No evidence of malignancy.
2. The area of presumed fat necrosis seen on the prior exam has
resolved.

RECOMMENDATION:
Screening mammogram in one year.(Code:[O4])

I have discussed the findings and recommendations with the patient.
Results were also provided in writing at the conclusion of the
visit. If applicable, a reminder letter will be sent to the patient
regarding the next appointment.

BI-RADS CATEGORY  1: Negative.

## 2017-03-25 ENCOUNTER — Ambulatory Visit (HOSPITAL_COMMUNITY)
Admission: RE | Admit: 2017-03-25 | Discharge: 2017-03-25 | Disposition: A | Discharge status: Home / Self Care | Payer: Medicare Other | Source: Ambulatory Visit | Attending: Internal Medicine | Admitting: Internal Medicine

## 2017-03-25 ENCOUNTER — Encounter (HOSPITAL_COMMUNITY): Payer: Self-pay

## 2017-03-25 DIAGNOSIS — M810 Age-related osteoporosis without current pathological fracture: Secondary | ICD-10-CM | POA: Insufficient documentation

## 2017-03-25 MED ORDER — DENOSUMAB 60 MG/ML ~~LOC~~ SOLN
60.0000 mg | Freq: Once | SUBCUTANEOUS | Status: AC
  Administered 2017-03-25: 60 mg via SUBCUTANEOUS
  Filled 2017-03-25: qty 1

## 2017-03-25 NOTE — Discharge Instructions (Signed)
° ° °Prolia °Denosumab injection °What is this medicine? °DENOSUMAB (den oh sue mab) slows bone breakdown. Prolia is used to treat osteoporosis in women after menopause and in men. Xgeva is used to treat a high calcium level due to cancer and to prevent bone fractures and other bone problems caused by multiple myeloma or cancer bone metastases. Xgeva is also used to treat giant cell tumor of the bone. °This medicine may be used for other purposes; ask your health care provider or pharmacist if you have questions. °COMMON BRAND NAME(S): Prolia, XGEVA °What should I tell my health care provider before I take this medicine? °They need to know if you have any of these conditions: °-dental disease °-having surgery or tooth extraction °-infection °-kidney disease °-low levels of calcium or Vitamin D in the blood °-malnutrition °-on hemodialysis °-skin conditions or sensitivity °-thyroid or parathyroid disease °-an unusual reaction to denosumab, other medicines, foods, dyes, or preservatives °-pregnant or trying to get pregnant °-breast-feeding °How should I use this medicine? °This medicine is for injection under the skin. It is given by a health care professional in a hospital or clinic setting. °If you are getting Prolia, a special MedGuide will be given to you by the pharmacist with each prescription and refill. Be sure to read this information carefully each time. °For Prolia, talk to your pediatrician regarding the use of this medicine in children. Special care may be needed. For Xgeva, talk to your pediatrician regarding the use of this medicine in children. While this drug may be prescribed for children as young as 13 years for selected conditions, precautions do apply. °Overdosage: If you think you have taken too much of this medicine contact a poison control center or emergency room at once. °NOTE: This medicine is only for you. Do not share this medicine with others. °What if I miss a dose? °It is important not  to miss your dose. Call your doctor or health care professional if you are unable to keep an appointment. °What may interact with this medicine? °Do not take this medicine with any of the following medications: °-other medicines containing denosumab °This medicine may also interact with the following medications: °-medicines that lower your chance of fighting infection °-steroid medicines like prednisone or cortisone °This list may not describe all possible interactions. Give your health care provider a list of all the medicines, herbs, non-prescription drugs, or dietary supplements you use. Also tell them if you smoke, drink alcohol, or use illegal drugs. Some items may interact with your medicine. °What should I watch for while using this medicine? °Visit your doctor or health care professional for regular checks on your progress. Your doctor or health care professional may order blood tests and other tests to see how you are doing. °Call your doctor or health care professional for advice if you get a fever, chills or sore throat, or other symptoms of a cold or flu. Do not treat yourself. This drug may decrease your body's ability to fight infection. Try to avoid being around people who are sick. °You should make sure you get enough calcium and vitamin D while you are taking this medicine, unless your doctor tells you not to. Discuss the foods you eat and the vitamins you take with your health care professional. °See your dentist regularly. Brush and floss your teeth as directed. Before you have any dental work done, tell your dentist you are receiving this medicine. °Do not become pregnant while taking this medicine or for 5   months after stopping it. Talk with your doctor or health care professional about your birth control options while taking this medicine. Women should inform their doctor if they wish to become pregnant or think they might be pregnant. There is a potential for serious side effects to an unborn  child. Talk to your health care professional or pharmacist for more information. °What side effects may I notice from receiving this medicine? °Side effects that you should report to your doctor or health care professional as soon as possible: °-allergic reactions like skin rash, itching or hives, swelling of the face, lips, or tongue °-bone pain °-breathing problems °-dizziness °-jaw pain, especially after dental work °-redness, blistering, peeling of the skin °-signs and symptoms of infection like fever or chills; cough; sore throat; pain or trouble passing urine °-signs of low calcium like fast heartbeat, muscle cramps or muscle pain; pain, tingling, numbness in the hands or feet; seizures °-unusual bleeding or bruising °-unusually weak or tired °Side effects that usually do not require medical attention (report to your doctor or health care professional if they continue or are bothersome): °-constipation °-diarrhea °-headache °-joint pain °-loss of appetite °-muscle pain °-runny nose °-tiredness °-upset stomach °This list may not describe all possible side effects. Call your doctor for medical advice about side effects. You may report side effects to FDA at 1-800-FDA-1088. °Where should I keep my medicine? °This medicine is only given in a clinic, doctor's office, or other health care setting and will not be stored at home. °NOTE: This sheet is a summary. It may not cover all possible information. If you have questions about this medicine, talk to your doctor, pharmacist, or health care provider. °© 2018 [employer]vier/Gold Standard (2016-11-18 19:17:21) ° °

## 2017-06-24 ENCOUNTER — Emergency Department (HOSPITAL_COMMUNITY)
Admission: EM | Admit: 2017-06-24 | Discharge: 2017-06-24 | Disposition: A | Discharge status: Home / Self Care | Payer: Medicare Other | Attending: Emergency Medicine | Admitting: Emergency Medicine

## 2017-06-24 DIAGNOSIS — Z87891 Personal history of nicotine dependence: Secondary | ICD-10-CM | POA: Insufficient documentation

## 2017-06-24 DIAGNOSIS — I2510 Atherosclerotic heart disease of native coronary artery without angina pectoris: Secondary | ICD-10-CM | POA: Insufficient documentation

## 2017-06-24 DIAGNOSIS — R55 Syncope and collapse: Secondary | ICD-10-CM | POA: Insufficient documentation

## 2017-06-24 DIAGNOSIS — Z79899 Other long term (current) drug therapy: Secondary | ICD-10-CM | POA: Diagnosis not present

## 2017-06-24 DIAGNOSIS — T50905A Adverse effect of unspecified drugs, medicaments and biological substances, initial encounter: Secondary | ICD-10-CM

## 2017-06-24 DIAGNOSIS — Z7982 Long term (current) use of aspirin: Secondary | ICD-10-CM | POA: Insufficient documentation

## 2017-06-24 DIAGNOSIS — T483X5A Adverse effect of antitussives, initial encounter: Secondary | ICD-10-CM | POA: Insufficient documentation

## 2017-06-24 DIAGNOSIS — I10 Essential (primary) hypertension: Secondary | ICD-10-CM | POA: Insufficient documentation

## 2017-06-24 DIAGNOSIS — Z955 Presence of coronary angioplasty implant and graft: Secondary | ICD-10-CM | POA: Diagnosis not present

## 2017-06-24 DIAGNOSIS — Z7902 Long term (current) use of antithrombotics/antiplatelets: Secondary | ICD-10-CM | POA: Insufficient documentation

## 2017-06-24 DIAGNOSIS — R030 Elevated blood-pressure reading, without diagnosis of hypertension: Secondary | ICD-10-CM | POA: Diagnosis not present

## 2017-06-24 LAB — CBC
HCT: 39.3 % (ref 36.0–46.0)
Hemoglobin: 13.2 g/dL (ref 12.0–15.0)
MCH: 32.2 pg (ref 26.0–34.0)
MCHC: 33.6 g/dL (ref 30.0–36.0)
MCV: 95.9 fL (ref 78.0–100.0)
PLATELETS: 375 10*3/uL (ref 150–400)
RBC: 4.1 MIL/uL (ref 3.87–5.11)
RDW: 14.7 % (ref 11.5–15.5)
WBC: 14.4 10*3/uL — ABNORMAL HIGH (ref 4.0–10.5)

## 2017-06-24 LAB — BASIC METABOLIC PANEL
Anion gap: 8 (ref 5–15)
BUN: 20 mg/dL (ref 6–20)
CALCIUM: 9.5 mg/dL (ref 8.9–10.3)
CO2: 23 mmol/L (ref 22–32)
CREATININE: 1.04 mg/dL — AB (ref 0.44–1.00)
Chloride: 102 mmol/L (ref 101–111)
GFR calc Af Amer: >60 mL/min (ref >60)
GFR, EST NON AFRICAN AMERICAN: 52 mL/min — AB (ref >60)
GLUCOSE: 123 mg/dL — AB (ref 65–99)
Potassium: 4.4 mmol/L (ref 3.5–5.1)
Sodium: 133 mmol/L — ABNORMAL LOW (ref 135–145)

## 2017-06-24 LAB — URINALYSIS, ROUTINE W REFLEX MICROSCOPIC
Bacteria, UA: NONE SEEN
Bilirubin Urine: NEGATIVE
Glucose, UA: NEGATIVE mg/dL
Hgb urine dipstick: NEGATIVE
Ketones, ur: NEGATIVE mg/dL
Nitrite: NEGATIVE
PH: 6 (ref 5.0–8.0)
PROTEIN: NEGATIVE mg/dL
SPECIFIC GRAVITY, URINE: 1.013 (ref 1.005–1.030)

## 2017-06-24 NOTE — ED Provider Notes (Signed)
[facility] DEPT Provider Note   CSN: [id number] Arrival date & time: 06/24/17  2103     History   Chief Complaint Chief Complaint  Patient presents with  . Loss of Consciousness  . Hypertension    HPI Samantha King is a 71 y.o. female.  71 year old female who presents after becoming acutely dizzy about hour after drinking dextromethorphan. States that she took this to help with a sinus infection. She be came syncopal. No reported seizure activity. Did have prodromal symptoms prior to that. Denies call was given Zofran. No headache or blurred vision. No abdominal discomfort. Feels much better at this time.      Past Medical History:  Diagnosis Date  . CAD (coronary artery disease)    a. s/p INF-LAT STEMI => s/p DES-RCA c/b VF arrest and cardiogenic shock  . Cardiac arrest ([place]) 10/2012   STEMI with VF arrest x 2   . GERD (gastroesophageal reflux disease)   . H1N1 influenza 10/2012  . Hx of echocardiogram    a. Echo 10/30/12: Moderate LVH, EF 55-60%, normal wall motion, PASP 45  . Hyperlipidemia   . Hypertension   . Pneumonia 10/2012   a. STEMI c/b LLL pneumonia in setting of recent H1N1 influenza  . PUD (peptic ulcer disease)   . Syncope 1996   reported episode while driving in [place] in 1996 with full cardiac workup = negative  . Tobacco abuse     Patient Active Problem List   Diagnosis Date Noted  . Coronary atherosclerosis of native coronary artery 11/10/2012  . Tobacco abuse 11/09/2012  . NSVT (nonsustained ventricular tachycardia) ([place]) 11/09/2012  . Acute paranoia ([place]) 11/09/2012  . Severe muscle deconditioning 11/09/2012  . Hyperlipidemia 11/09/2012  . Hypokalemia 11/05/2012  . Acute blood loss anemia 11/01/2012  . H1N1 influenza with pneumonia 10/31/2012  . Hypertension 10/29/2012  . GERD (gastroesophageal reflux disease) 10/29/2012  . Acute respiratory failure ([place]) 10/29/2012  . Cardiogenic shock ([place]) 10/29/2012  . Cardiac arrest ([place])  10/29/2012  . ST elevation myocardial infarction (STEMI) of inferior wall ([place]) 10/29/2012    Past Surgical History:  Procedure Laterality Date  . BREAST EXCISIONAL BIOPSY     left breast ? 1981  . CORONARY ANGIOPLASTY WITH STENT PLACEMENT     s/p inferior STEMI c/b VF arrest and cardiogenic shock requiring IABP  . INTRA-AORTIC BALLOON PUMP INSERTION  10/29/2012   Procedure: INTRA-AORTIC BALLOON PUMP INSERTION;  Surgeon: Burnell Blanks, MD;  Location: [facility] CATH LAB;  Service: Cardiovascular;;  . LEFT HEART CATHETERIZATION WITH CORONARY ANGIOGRAM N/A 10/29/2012   Procedure: LEFT HEART CATHETERIZATION WITH CORONARY ANGIOGRAM;  Surgeon: Burnell Blanks, MD;  Location: [facility] CATH LAB;  Service: Cardiovascular;  Laterality: N/A;  . PERCUTANEOUS CORONARY STENT INTERVENTION (PCI-S)  10/29/2012   Procedure: PERCUTANEOUS CORONARY STENT INTERVENTION (PCI-S);  Surgeon: Burnell Blanks, MD;  Location: [facility] CATH LAB;  Service: Cardiovascular;;  . TUBAL LIGATION      OB History    No data available       Home Medications    Prior to Admission medications   Medication Sig Start Date End Date Taking? Authorizing Provider  acetaminophen (TYLENOL) 325 MG tablet Take 650 mg by mouth every 6 (six) hours as needed. For headache or pain   Yes [provider]  aspirin 81 MG tablet Take 81 mg by mouth daily.   Yes [provider]  atorvastatin (LIPITOR) 80 MG tablet Take 1 tablet (80 mg total) by mouth daily  at 6 PM. 11/15/12  Yes Love, Ivan Anchors, PA-C  clopidogrel (PLAVIX) 75 MG tablet TAKE ONE TABLET BY MOUTH ONCE DAILY 01/07/17  Yes Burnell Blanks, MD  escitalopram (LEXAPRO) 5 MG tablet Take 5 mg by mouth daily.   Yes [provider]  furosemide (LASIX) 40 MG tablet Take 1 tablet (40 mg total) by mouth daily. 11/15/12  Yes Love, Ivan Anchors, PA-C  Krill Oil 500 MG CAPS Take 1 capsule by mouth daily.   Yes [provider]  lisinopril (PRINIVIL,ZESTRIL)  5 MG tablet Take 1 tablet (5 mg total) by mouth daily. 11/15/12  Yes Love, Ivan Anchors, PA-C  metoprolol tartrate (LOPRESSOR) 25 MG tablet Take 1 tablet (25 mg total) by mouth 2 (two) times daily. 11/15/12  Yes Love, Pamela S, PA-C  NITROSTAT 0.4 MG SL tablet DISSOLVE ONE TABLET UNDER THE TONGUE EVERY 5 MINUTES AS NEEDED FOR CHEST PAIN.  DO NOT EXCEED A TOTAL OF 3 DOSES IN 15 MINUTES 03/21/16  Yes Burnell Blanks, MD  pantoprazole (PROTONIX) 40 MG tablet TAKE ONE TABLET BY MOUTH ONCE DAILY 01/07/17  Yes Burnell Blanks, MD  Probiotic Product (PROBIOTIC PO) Take 1 capsule by mouth daily.   Yes [provider]  psyllium (REGULOID) 0.52 G capsule Take 0.52 g by mouth daily.   Yes [provider]  SYMBICORT 160-4.5 MCG/ACT inhaler Inhale 1 puff into the lungs daily as needed (shortness of breath). cough 10/22/15  Yes [provider]    Family History Family History  Problem Relation Age of Onset  . Heart attack Brother   . Stroke Maternal Grandmother     Social History Social History  Substance Use Topics  . Smoking status: Former Smoker    Packs/day: 1.00    Years: 20.00    Types: Cigarettes    Quit date: 11/05/2012  . Smokeless tobacco: Former Systems developer    Quit date: 11/30/2011  . Alcohol use No     Allergies   Shellfish allergy and Codeine   Review of Systems Review of Systems  All other systems reviewed and are negative.    Physical Exam Updated Vital Signs BP (!) 164/89 (BP Location: Right Arm)   Pulse 73   Resp 16   Ht 1.6 m (5\' 3" )   Wt 72.6 kg (160 lb)   SpO2 94%   BMI 28.34 kg/m   Physical Exam  Constitutional: She is oriented to person, place, and time. She appears well-developed and well-nourished.  Non-toxic appearance. No distress.  HENT:  Head: Normocephalic and atraumatic.  Eyes: Pupils are equal, round, and reactive to light. Conjunctivae, EOM and lids are normal.  Neck: Normal range of motion. Neck supple. No tracheal  deviation present. No thyroid mass present.  Cardiovascular: Normal rate, regular rhythm and normal heart sounds.  Exam reveals no gallop.   No murmur heard. Pulmonary/Chest: Effort normal and breath sounds normal. No stridor. No respiratory distress. She has no decreased breath sounds. She has no wheezes. She has no rhonchi. She has no rales.  Abdominal: Soft. Normal appearance and bowel sounds are normal. She exhibits no distension. There is no tenderness. There is no rebound and no CVA tenderness.  Musculoskeletal: Normal range of motion. She exhibits no edema or tenderness.  Neurological: She is alert and oriented to person, place, and time. She has normal strength. No cranial nerve deficit or sensory deficit. GCS eye subscore is 4. GCS verbal subscore is 5. GCS motor subscore is 6.  Skin:  Skin is warm and dry. No abrasion and no rash noted.  Psychiatric: She has a normal mood and affect. Her speech is normal and behavior is normal.  Nursing note and vitals reviewed.    ED Treatments / Results  Labs (all labs ordered are listed, but only abnormal results are displayed) Labs Reviewed  BASIC METABOLIC PANEL  CBC  URINALYSIS, ROUTINE W REFLEX MICROSCOPIC  CBG MONITORING, ED    EKG  EKG Interpretation None       Radiology No results found.  Procedures Procedures (including critical care time)  Medications Ordered in ED Medications - No data to display   Initial Impression / Assessment and Plan / ED Course  I have reviewed the triage vital signs and the nursing notes.  Pertinent labs & imaging results that were available during my care of the patient were reviewed by me and considered in my medical decision making (see chart for details).     Patient currently denies any dyspnea or shortness of breath. No chest pain or chest pressure. No headache noted. Labs here are reassuring. She is not orthostatic. She is mildly hypertensive. She has history of same. No neurological  deficits noted. Patient feels at her baseline and wanted to go home. Return precautions given Final Clinical Impressions(s) / ED Diagnoses   Final diagnoses:  None    New Prescriptions New Prescriptions   No medications on file     Lacretia Leigh, MD 06/24/17 2328

## 2017-06-24 NOTE — ED Triage Notes (Signed)
Pt BIB EMS from home for syncopal episode and hypertension. Per EMS pt was sitting when she had syncopal episode and nausea lasting approx 10 sec. LSW 1730. Received 4 mg zofran PTA. Pt denies HA, CP, or vison changes. No neuro deficits; pt A&Ox4; resp e/u, nad aty this time.   Per EMS pt sats 93% on RA and symptoms resolved when placed on 2 L [place]. Pt has hx of MI with cardiac arrest back in 2013.

## 2017-06-24 NOTE — Discharge Instructions (Signed)
Avoid using Delsym. Take your blood pressure medication as directed and follow-up with your Dr. this week for repeat blood pressure. Return here at once if you become dizzy, lightheaded, or feel like you going to pass out

## 2017-07-07 DIAGNOSIS — N952 Postmenopausal atrophic vaginitis: Secondary | ICD-10-CM | POA: Diagnosis not present

## 2017-07-07 DIAGNOSIS — I2510 Atherosclerotic heart disease of native coronary artery without angina pectoris: Secondary | ICD-10-CM | POA: Diagnosis not present

## 2017-07-07 DIAGNOSIS — Z6829 Body mass index (BMI) 29.0-29.9, adult: Secondary | ICD-10-CM | POA: Diagnosis not present

## 2017-07-07 DIAGNOSIS — I10 Essential (primary) hypertension: Secondary | ICD-10-CM | POA: Diagnosis not present

## 2017-07-07 DIAGNOSIS — Z01419 Encounter for gynecological examination (general) (routine) without abnormal findings: Secondary | ICD-10-CM | POA: Diagnosis not present

## 2017-07-07 DIAGNOSIS — Z1151 Encounter for screening for human papillomavirus (HPV): Secondary | ICD-10-CM | POA: Diagnosis not present

## 2017-07-14 DIAGNOSIS — H2512 Age-related nuclear cataract, left eye: Secondary | ICD-10-CM | POA: Diagnosis not present

## 2017-09-07 DIAGNOSIS — F331 Major depressive disorder, recurrent, moderate: Secondary | ICD-10-CM | POA: Diagnosis not present

## 2017-09-07 DIAGNOSIS — I2510 Atherosclerotic heart disease of native coronary artery without angina pectoris: Secondary | ICD-10-CM | POA: Diagnosis not present

## 2017-09-07 DIAGNOSIS — I119 Hypertensive heart disease without heart failure: Secondary | ICD-10-CM | POA: Diagnosis not present

## 2017-09-07 DIAGNOSIS — M810 Age-related osteoporosis without current pathological fracture: Secondary | ICD-10-CM | POA: Diagnosis not present

## 2017-09-07 DIAGNOSIS — E7800 Pure hypercholesterolemia, unspecified: Secondary | ICD-10-CM | POA: Diagnosis not present

## 2017-09-07 DIAGNOSIS — Z6829 Body mass index (BMI) 29.0-29.9, adult: Secondary | ICD-10-CM | POA: Diagnosis not present

## 2017-09-07 DIAGNOSIS — Z23 Encounter for immunization: Secondary | ICD-10-CM | POA: Diagnosis not present

## 2017-09-07 DIAGNOSIS — K219 Gastro-esophageal reflux disease without esophagitis: Secondary | ICD-10-CM | POA: Diagnosis not present

## 2017-09-07 DIAGNOSIS — J302 Other seasonal allergic rhinitis: Secondary | ICD-10-CM | POA: Diagnosis not present

## 2017-09-30 ENCOUNTER — Ambulatory Visit (HOSPITAL_COMMUNITY): Payer: Medicare Other

## 2017-10-07 ENCOUNTER — Encounter (HOSPITAL_COMMUNITY): Payer: Self-pay

## 2017-10-07 ENCOUNTER — Ambulatory Visit (HOSPITAL_COMMUNITY)
Admission: RE | Admit: 2017-10-07 | Discharge: 2017-10-07 | Disposition: A | Discharge status: Home / Self Care | Payer: Medicare Other | Source: Ambulatory Visit | Attending: Internal Medicine | Admitting: Internal Medicine

## 2017-10-07 DIAGNOSIS — M810 Age-related osteoporosis without current pathological fracture: Secondary | ICD-10-CM | POA: Insufficient documentation

## 2017-10-07 MED ORDER — DENOSUMAB 60 MG/ML ~~LOC~~ SOLN
60.0000 mg | Freq: Once | SUBCUTANEOUS | Status: AC
  Administered 2017-10-07: 60 mg via SUBCUTANEOUS
  Filled 2017-10-07: qty 1

## 2017-10-07 NOTE — Discharge Instructions (Signed)
Denosumab injection °What is this medicine? °DENOSUMAB (den oh sue mab) slows bone breakdown. Prolia is used to treat osteoporosis in women after menopause and in men. Xgeva is used to treat a high calcium level due to cancer and to prevent bone fractures and other bone problems caused by multiple myeloma or cancer bone metastases. Xgeva is also used to treat giant cell tumor of the bone. °This medicine may be used for other purposes; ask your health care provider or pharmacist if you have questions. °COMMON BRAND NAME(S): Prolia, XGEVA °What should I tell my health care provider before I take this medicine? °They need to know if you have any of these conditions: °-dental disease °-having surgery or tooth extraction °-infection °-kidney disease °-low levels of calcium or Vitamin D in the blood °-malnutrition °-on hemodialysis °-skin conditions or sensitivity °-thyroid or parathyroid disease °-an unusual reaction to denosumab, other medicines, foods, dyes, or preservatives °-pregnant or trying to get pregnant °-breast-feeding °How should I use this medicine? °This medicine is for injection under the skin. It is given by a health care professional in a hospital or clinic setting. °If you are getting Prolia, a special MedGuide will be given to you by the pharmacist with each prescription and refill. Be sure to read this information carefully each time. °For Prolia, talk to your pediatrician regarding the use of this medicine in children. Special care may be needed. For Xgeva, talk to your pediatrician regarding the use of this medicine in children. While this drug may be prescribed for children as young as 13 years for selected conditions, precautions do apply. °Overdosage: If you think you have taken too much of this medicine contact a poison control center or emergency room at once. °NOTE: This medicine is only for you. Do not share this medicine with others. °What if I miss a dose? °It is important not to miss your  dose. Call your doctor or health care professional if you are unable to keep an appointment. °What may interact with this medicine? °Do not take this medicine with any of the following medications: °-other medicines containing denosumab °This medicine may also interact with the following medications: °-medicines that lower your chance of fighting infection °-steroid medicines like prednisone or cortisone °This list may not describe all possible interactions. Give your health care provider a list of all the medicines, herbs, non-prescription drugs, or dietary supplements you use. Also tell them if you smoke, drink alcohol, or use illegal drugs. Some items may interact with your medicine. °What should I watch for while using this medicine? °Visit your doctor or health care professional for regular checks on your progress. Your doctor or health care professional may order blood tests and other tests to see how you are doing. °Call your doctor or health care professional for advice if you get a fever, chills or sore throat, or other symptoms of a cold or flu. Do not treat yourself. This drug may decrease your body's ability to fight infection. Try to avoid being around people who are sick. °You should make sure you get enough calcium and vitamin D while you are taking this medicine, unless your doctor tells you not to. Discuss the foods you eat and the vitamins you take with your health care professional. °See your dentist regularly. Brush and floss your teeth as directed. Before you have any dental work done, tell your dentist you are receiving this medicine. °Do not become pregnant while taking this medicine or for 5 months after stopping   it. Talk with your doctor or health care professional about your birth control options while taking this medicine. Women should inform their doctor if they wish to become pregnant or think they might be pregnant. There is a potential for serious side effects to an unborn child. Talk  to your health care professional or pharmacist for more information. What side effects may I notice from receiving this medicine? Side effects that you should report to your doctor or health care professional as soon as possible: -allergic reactions like skin rash, itching or hives, swelling of the face, lips, or tongue -bone pain -breathing problems -dizziness -jaw pain, especially after dental work -redness, blistering, peeling of the skin -signs and symptoms of infection like fever or chills; cough; sore throat; pain or trouble passing urine -signs of low calcium like fast heartbeat, muscle cramps or muscle pain; pain, tingling, numbness in the hands or feet; seizures -unusual bleeding or bruising -unusually weak or tired Side effects that usually do not require medical attention (report to your doctor or health care professional if they continue or are bothersome): -constipation -diarrhea -headache -joint pain -loss of appetite -muscle pain -runny nose -tiredness -upset stomach This list may not describe all possible side effects. Call your doctor for medical advice about side effects. You may report side effects to FDA at 1-800-FDA-1088. Where should I keep my medicine? This medicine is only given in a clinic, doctor's office, or other health care setting and will not be stored at home. NOTE: This sheet is a summary. It may not cover all possible information. If you have questions about this medicine, talk to your doctor, pharmacist, or health care provider.  2018 Elsevier/Gold Standard (2016-11-18 19:17:21)

## 2017-12-23 ENCOUNTER — Encounter: Payer: Self-pay | Admitting: Cardiovascular Disease

## 2017-12-23 ENCOUNTER — Ambulatory Visit (INDEPENDENT_AMBULATORY_CARE_PROVIDER_SITE_OTHER): Payer: Medicare Other | Admitting: Cardiovascular Disease

## 2017-12-23 VITALS — BP 130/88 | HR 81 | Ht 62.0 in | Wt 164.0 lb

## 2017-12-23 DIAGNOSIS — I2510 Atherosclerotic heart disease of native coronary artery without angina pectoris: Secondary | ICD-10-CM | POA: Diagnosis not present

## 2017-12-23 DIAGNOSIS — E7800 Pure hypercholesterolemia, unspecified: Secondary | ICD-10-CM

## 2017-12-23 DIAGNOSIS — I10 Essential (primary) hypertension: Secondary | ICD-10-CM

## 2017-12-23 NOTE — Patient Instructions (Signed)

## 2017-12-23 NOTE — Progress Notes (Signed)
Chief Complaint  Patient presents with  . Coronary Artery Disease    History of Present Illness: 72 yo female with history of CAD, HTN, HLD who is here today for cardiac follow up. She was admitted to [facility] December 2013 with an inferior STEMI. She suffered cardiac arrest en route requiring CPR and defibrillation. Emergent cardiac cath showed proximal RCA occlusion with irregularities in the LAD and Circumflex. A drug eluting stent was placed in the proximal RCA. Echo 10/30/12 with moderate LVH, EF 55-60%, normal wall motion, PASP 45. She required temporary pacemaker implantation for bradycardia during the cath. She had recurrent ventricular fibrillation post PCI requiring defibrillation. She also required IABP briefly for cardiogenic shock. Just before her event, she tested positive for H1N1 flu. Echo February 2018 with normal LV function. Contrast used and did not demonstrate LV thrombus.   She is here today for follow up. The patient denies any chest pain, dyspnea, palpitations, lower extremity edema, orthopnea, PND, dizziness, near syncope or syncope.   Primary Care Physician:  Haywood Pao, MD  Past Medical History:  Diagnosis Date  . CAD (coronary artery disease)    a. s/p INF-LAT STEMI => s/p DES-RCA c/b VF arrest and cardiogenic shock  . Cardiac arrest ([place]) 10/2012   STEMI with VF arrest x 2   . GERD (gastroesophageal reflux disease)   . H1N1 influenza 10/2012  . Hx of echocardiogram    a. Echo 10/30/12: Moderate LVH, EF 55-60%, normal wall motion, PASP 45  . Hyperlipidemia   . Hypertension   . Pneumonia 10/2012   a. STEMI c/b LLL pneumonia in setting of recent H1N1 influenza  . PUD (peptic ulcer disease)   . Syncope 1996   reported episode while driving in [place] in 1996 with full cardiac workup = negative  . Tobacco abuse     Past Surgical History:  Procedure Laterality Date  . BREAST EXCISIONAL BIOPSY     left breast ? 1981  . CORONARY  ANGIOPLASTY WITH STENT PLACEMENT     s/p inferior STEMI c/b VF arrest and cardiogenic shock requiring IABP  . INTRA-AORTIC BALLOON PUMP INSERTION  10/29/2012   Procedure: INTRA-AORTIC BALLOON PUMP INSERTION;  Surgeon: Burnell Blanks, MD;  Location: [facility] CATH LAB;  Service: Cardiovascular;;  . LEFT HEART CATHETERIZATION WITH CORONARY ANGIOGRAM N/A 10/29/2012   Procedure: LEFT HEART CATHETERIZATION WITH CORONARY ANGIOGRAM;  Surgeon: Burnell Blanks, MD;  Location: [facility] CATH LAB;  Service: Cardiovascular;  Laterality: N/A;  . PERCUTANEOUS CORONARY STENT INTERVENTION (PCI-S)  10/29/2012   Procedure: PERCUTANEOUS CORONARY STENT INTERVENTION (PCI-S);  Surgeon: Burnell Blanks, MD;  Location: [facility] CATH LAB;  Service: Cardiovascular;;  . TUBAL LIGATION      Current Outpatient Medications  Medication Sig Dispense Refill  . acetaminophen (TYLENOL) 325 MG tablet Take 650 mg by mouth every 6 (six) hours as needed. For headache or pain    . aspirin 81 MG tablet Take 81 mg by mouth daily.    [name] atorvastatin (LIPITOR) 80 MG tablet Take 1 tablet (80 mg total) by mouth daily at 6 PM. 30 tablet 1  . clopidogrel (PLAVIX) 75 MG tablet TAKE ONE TABLET BY MOUTH ONCE DAILY 90 tablet 3  . escitalopram (LEXAPRO) 5 MG tablet Take 5 mg by mouth daily.    . furosemide (LASIX) 40 MG tablet Take 1 tablet (40 mg total) by mouth daily. 30 tablet 1  . Krill Oil 500 MG CAPS Take 1 capsule by mouth  daily.    . lisinopril (PRINIVIL,ZESTRIL) 5 MG tablet Take 1 tablet (5 mg total) by mouth daily. 30 tablet 1  . metoprolol tartrate (LOPRESSOR) 25 MG tablet Take 1 tablet (25 mg total) by mouth 2 (two) times daily. 60 tablet 1  . NITROSTAT 0.4 MG SL tablet DISSOLVE ONE TABLET UNDER THE TONGUE EVERY 5 MINUTES AS NEEDED FOR CHEST PAIN.  DO NOT EXCEED A TOTAL OF 3 DOSES IN 15 MINUTES 25 tablet 5  . pantoprazole (PROTONIX) 40 MG tablet TAKE ONE TABLET BY MOUTH ONCE DAILY 90 tablet 3  . Probiotic Product (PROBIOTIC PO)  Take 1 capsule by mouth daily.    . psyllium (REGULOID) 0.52 G capsule Take 0.52 g by mouth daily.    . SYMBICORT 160-4.5 MCG/ACT inhaler Inhale 1 puff into the lungs daily as needed (shortness of breath). cough  2   No current facility-administered medications for this visit.     Allergies  Allergen Reactions  . Shellfish Allergy Nausea And Vomiting  . Codeine Other (See Comments)    Makes me hyperactive and not able to sleep    Social History   Socioeconomic History  . Marital status: Married    Spouse name: Not on file  . Number of children: Not on file  . Years of education: Not on file  . Highest education level: Not on file  Social Needs  . Financial resource strain: Not on file  . Food insecurity - worry: Not on file  . Food insecurity - inability: Not on file  . Transportation needs - medical: Not on file  . Transportation needs - non-medical: Not on file  Occupational History  . Not on file  Tobacco Use  . Smoking status: Former Smoker    Packs/day: 1.00    Years: 20.00    Pack years: 20.00    Types: Cigarettes    Last attempt to quit: 11/05/2012    Years since quitting: 5.1  . Smokeless tobacco: Former Systems developer    Quit date: 11/30/2011  Substance and Sexual Activity  . Alcohol use: No  . Drug use: No  . Sexual activity: Not on file  Other Topics Concern  . Not on file  Social History Narrative  . Not on file    Family History  Problem Relation Age of Onset  . Heart attack Brother   . Stroke Maternal Grandmother     Review of Systems:  As stated in the HPI and otherwise negative.   BP 130/88   Pulse 81   Ht 5\' 2"  (1.575 m)   Wt 164 lb (74.4 kg)   SpO2 95%   BMI 30.00 kg/m   Physical Examination:  General: Well developed, well nourished, NAD  HEENT: OP clear, mucus membranes moist  SKIN: warm, dry. No rashes. Neuro: No focal deficits  Musculoskeletal: Muscle strength 5/5 all ext  Psychiatric: Mood and affect normal  Neck: No JVD, no carotid  bruits, no thyromegaly, no lymphadenopathy.  Lungs:Clear bilaterally, no wheezes, rhonci, crackles Cardiovascular: Regular rate and rhythm. No murmurs, gallops or rubs. Abdomen:Soft. Bowel sounds present. Non-tender.  Extremities: No lower extremity edema. Pulses are 2 + in the bilateral DP/PT.  Echo February 2018: - Left ventricle: The cavity size was normal. Wall thickness was   increased in a pattern of moderate LVH. Systolic function was   normal. The estimated ejection fraction was in the range of 60%   to 65%. Wall motion was normal; there were no regional wall  motion abnormalities. Doppler parameters are consistent with   abnormal left ventricular relaxation (grade 1 diastolic   dysfunction). - Aortic valve: There was trivial regurgitation. - Mitral valve: There was mild regurgitation. - Pulmonary arteries: Systolic pressure was mildly increased. PA   peak pressure: 34 mm Hg (S).  EKG:  EKG is ordered today. The ekg ordered today demonstrates NSR, rate 81 bpm. Low voltage QRS.   Recent Labs: 06/24/2017: BUN 20; Creatinine, Ser 1.04; Hemoglobin 13.2; Platelets 375; Potassium 4.4; Sodium 133    Wt Readings from Last 3 Encounters:  12/23/17 164 lb (74.4 kg)  10/07/17 162 lb 3.2 oz (73.6 kg)  06/24/17 160 lb (72.6 kg)     Other studies Reviewed: Additional studies/ records that were reviewed today include: . Review of the above records demonstrates:    Assessment and Plan:   1. CAD without angina: No chest pain suggestive of angina. Will continue DAPT with ASA and Plavix. Will continue beta blocker and statin. Echo February 2018 with normal LV systolic function.   2. HTN: BP is well controlled. NO changes.   3. Hyperlipidemia: Lipids followed in primary care. Continue statin.   Current medicines are reviewed at length with the patient today.  The patient does not have concerns regarding medicines.  The following changes have been made:  no change  Labs/ tests  ordered today include:   No orders of the defined types were placed in this encounter.   Disposition:   FU with me in 12  months  Signed, Lauree Chandler, MD 12/23/2017 2:10 PM    [facility] [address], [place], [place]  [address] Phone: [phone]; Fax: [phone]

## 2017-12-30 ENCOUNTER — Other Ambulatory Visit: Payer: Self-pay | Admitting: Cardiovascular Disease

## 2018-02-04 ENCOUNTER — Other Ambulatory Visit: Payer: Self-pay | Admitting: Internal Medicine

## 2018-02-04 DIAGNOSIS — Z139 Encounter for screening, unspecified: Secondary | ICD-10-CM

## 2018-03-04 DIAGNOSIS — I10 Essential (primary) hypertension: Secondary | ICD-10-CM | POA: Diagnosis not present

## 2018-03-04 DIAGNOSIS — R82998 Other abnormal findings in urine: Secondary | ICD-10-CM | POA: Diagnosis not present

## 2018-03-04 DIAGNOSIS — E7800 Pure hypercholesterolemia, unspecified: Secondary | ICD-10-CM | POA: Diagnosis not present

## 2018-03-08 DIAGNOSIS — M810 Age-related osteoporosis without current pathological fracture: Secondary | ICD-10-CM | POA: Diagnosis not present

## 2018-03-11 DIAGNOSIS — I213 ST elevation (STEMI) myocardial infarction of unspecified site: Secondary | ICD-10-CM | POA: Diagnosis not present

## 2018-03-11 DIAGNOSIS — E7800 Pure hypercholesterolemia, unspecified: Secondary | ICD-10-CM | POA: Diagnosis not present

## 2018-03-11 DIAGNOSIS — J302 Other seasonal allergic rhinitis: Secondary | ICD-10-CM | POA: Diagnosis not present

## 2018-03-11 DIAGNOSIS — Z6829 Body mass index (BMI) 29.0-29.9, adult: Secondary | ICD-10-CM | POA: Diagnosis not present

## 2018-03-11 DIAGNOSIS — Z1389 Encounter for screening for other disorder: Secondary | ICD-10-CM | POA: Diagnosis not present

## 2018-03-11 DIAGNOSIS — I2510 Atherosclerotic heart disease of native coronary artery without angina pectoris: Secondary | ICD-10-CM | POA: Diagnosis not present

## 2018-03-11 DIAGNOSIS — M810 Age-related osteoporosis without current pathological fracture: Secondary | ICD-10-CM | POA: Diagnosis not present

## 2018-03-11 DIAGNOSIS — N390 Urinary tract infection, site not specified: Secondary | ICD-10-CM | POA: Diagnosis not present

## 2018-03-11 DIAGNOSIS — I119 Hypertensive heart disease without heart failure: Secondary | ICD-10-CM | POA: Diagnosis not present

## 2018-03-11 DIAGNOSIS — Z0000 Encounter for general adult medical examination without abnormal findings: Secondary | ICD-10-CM | POA: Diagnosis not present

## 2018-03-11 DIAGNOSIS — F3341 Major depressive disorder, recurrent, in partial remission: Secondary | ICD-10-CM | POA: Diagnosis not present

## 2018-03-11 DIAGNOSIS — N952 Postmenopausal atrophic vaginitis: Secondary | ICD-10-CM | POA: Diagnosis not present

## 2018-03-12 DIAGNOSIS — Z1212 Encounter for screening for malignant neoplasm of rectum: Secondary | ICD-10-CM | POA: Diagnosis not present

## 2018-03-13 ENCOUNTER — Other Ambulatory Visit: Payer: Self-pay | Admitting: Cardiovascular Disease

## 2018-03-15 ENCOUNTER — Other Ambulatory Visit: Payer: Self-pay | Admitting: Cardiovascular Disease

## 2018-03-15 ENCOUNTER — Other Ambulatory Visit (HOSPITAL_COMMUNITY): Payer: Self-pay

## 2018-03-15 ENCOUNTER — Ambulatory Visit
Admission: RE | Admit: 2018-03-15 | Discharge: 2018-03-15 | Disposition: A | Discharge status: Home / Self Care | Payer: Medicare Other | Source: Ambulatory Visit | Attending: Internal Medicine | Admitting: Internal Medicine

## 2018-03-15 DIAGNOSIS — Z139 Encounter for screening, unspecified: Secondary | ICD-10-CM

## 2018-03-15 DIAGNOSIS — Z1231 Encounter for screening mammogram for malignant neoplasm of breast: Secondary | ICD-10-CM | POA: Diagnosis not present

## 2018-03-15 IMAGING — MG DIGITAL SCREENING BILATERAL MAMMOGRAM WITH TOMO AND CAD
8 series · 8 of 24 positions shown · non-contrast
Comparison: Previous exam(s).

CLINICAL DATA: Screening.

EXAM:
DIGITAL SCREENING BILATERAL MAMMOGRAM WITH TOMO AND CAD

[L MLO synth-2D]
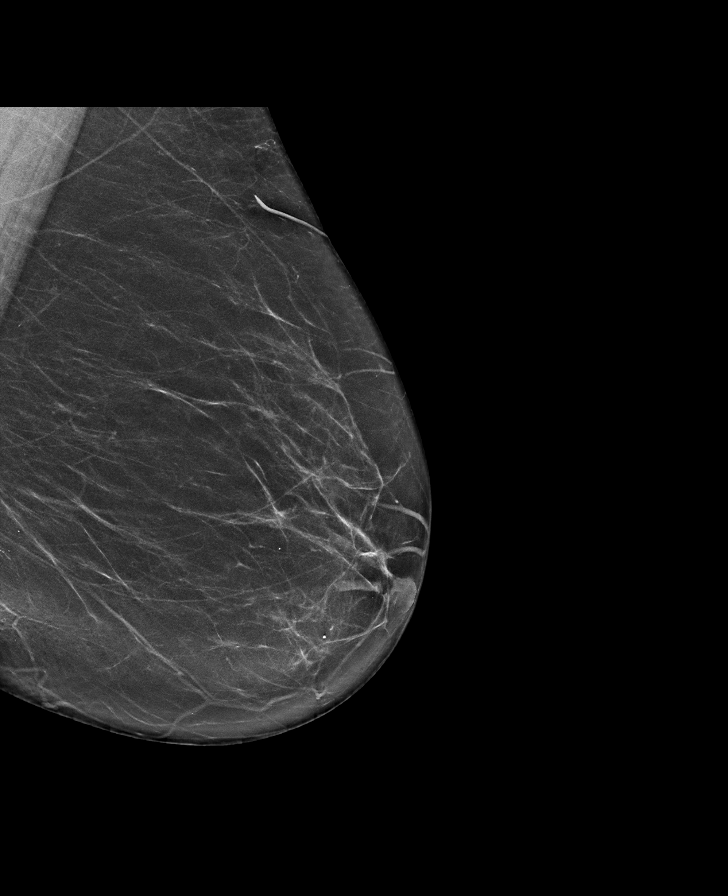

[L CC synth-2D]
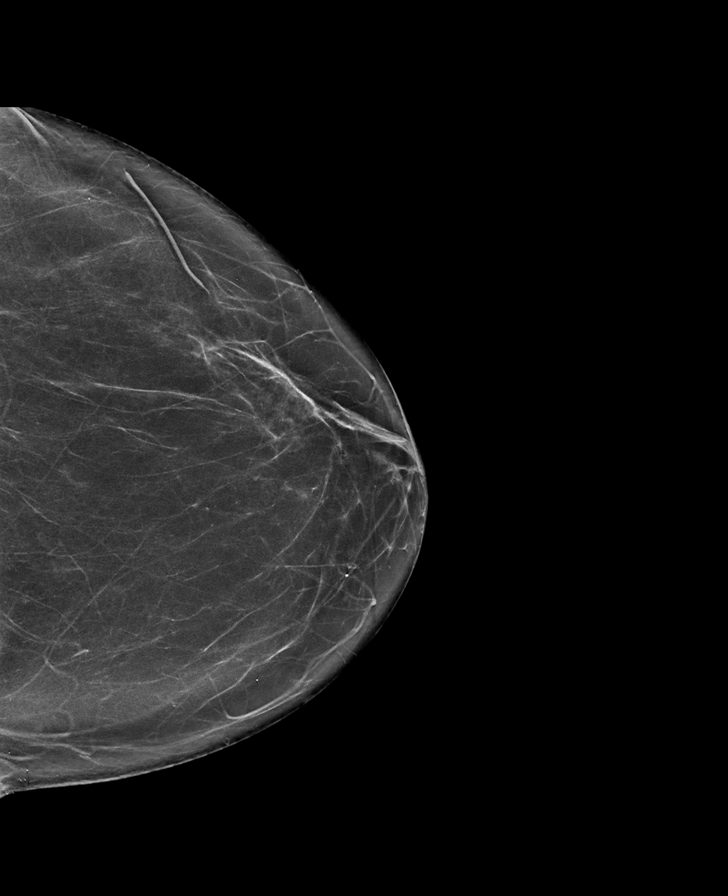

[R MLO synth-2D]
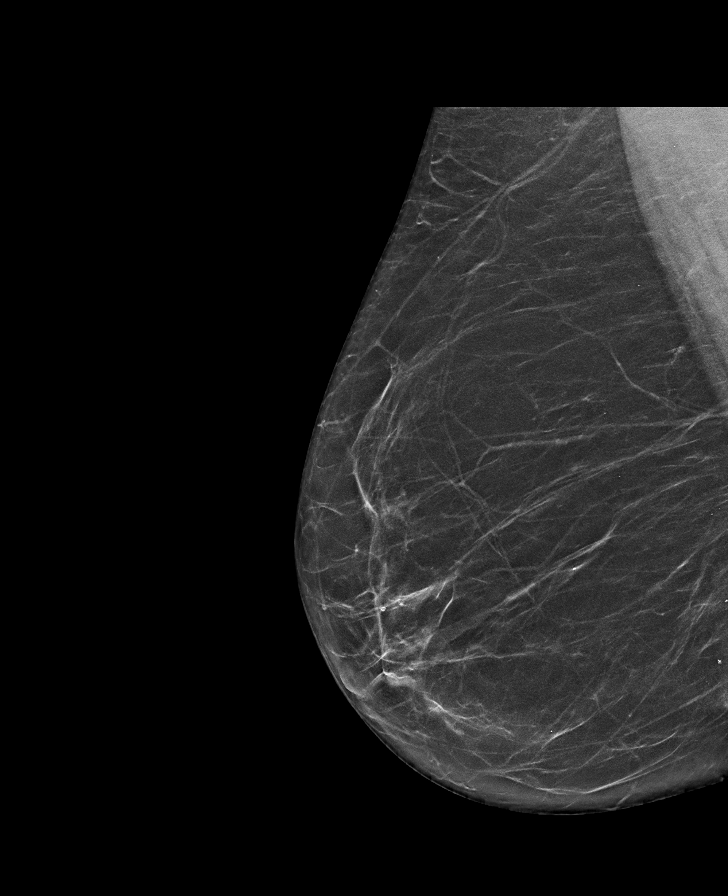

[R CC synth-2D]
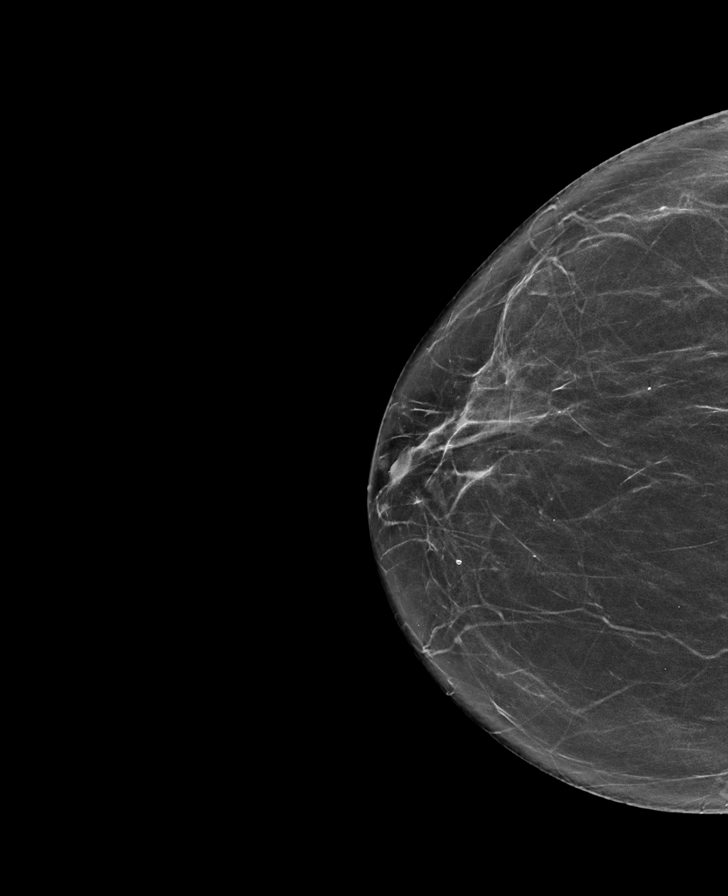

[R MLO tomo · tomo slice 37/72.0]
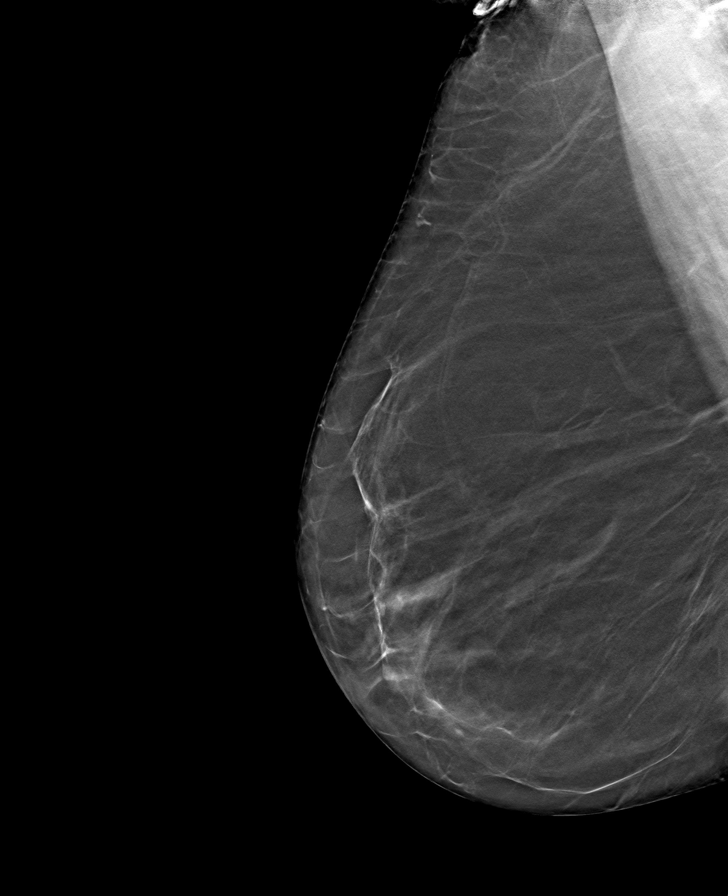

[L MLO tomo · tomo slice 37/72.0]
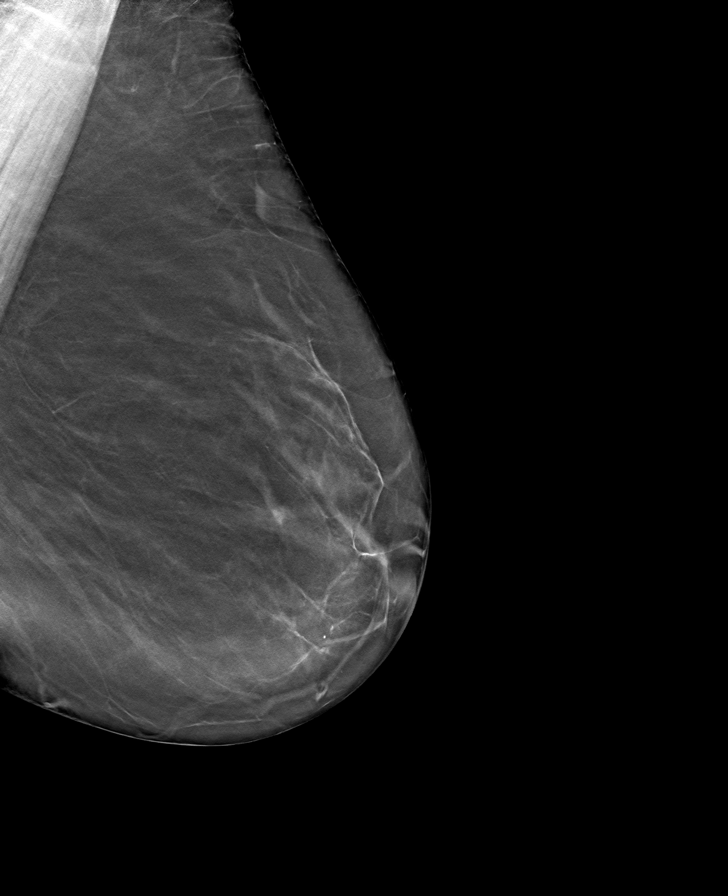

[L CC tomo · tomo slice 35/70.0]
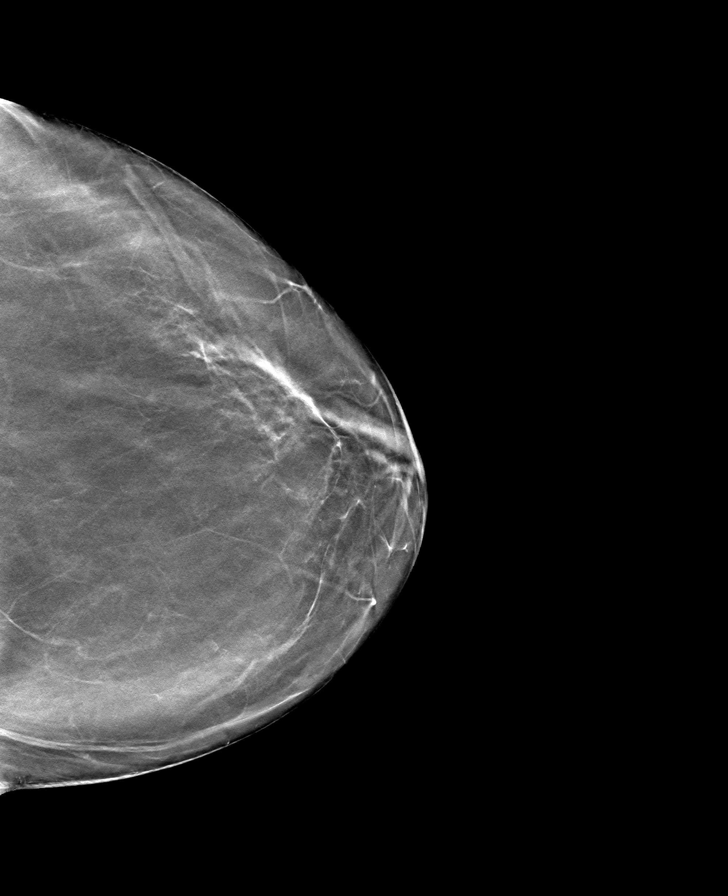

[R CC tomo · tomo slice 33/65.0]
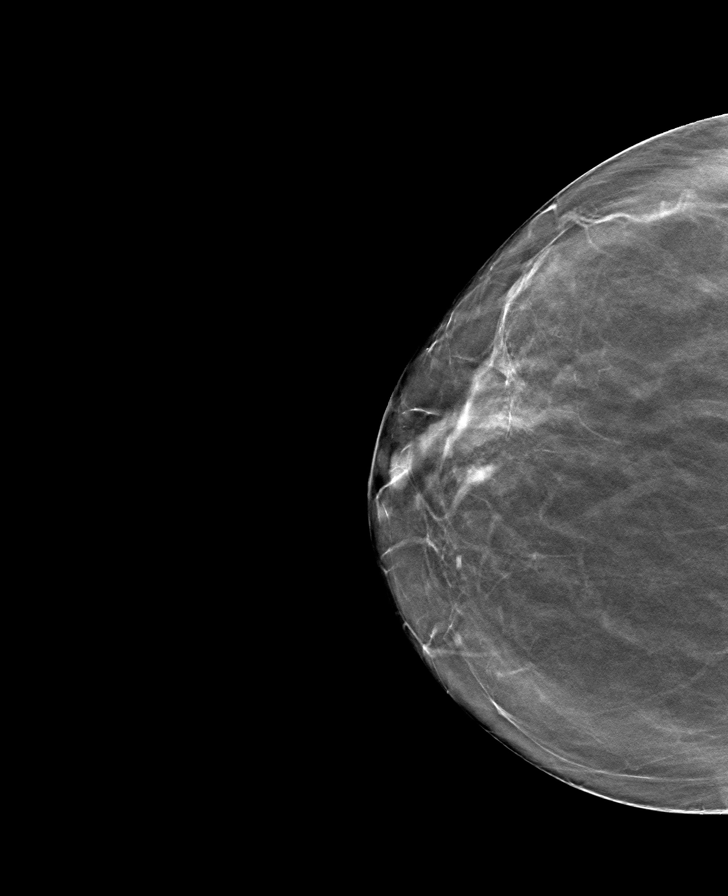

[8 of 24 positions shown; findings below may reference images not displayed]

ACR Breast Density Category b: There are scattered areas of
fibroglandular density.
FINDINGS: There are no findings suspicious for malignancy. Images were
processed with CAD.
IMPRESSION: No mammographic evidence of malignancy. A result letter of this
screening mammogram will be mailed directly to the patient.

RECOMMENDATION:
Screening mammogram in one year. (Code:[TQ])

BI-RADS CATEGORY  1: Negative.

## 2018-03-15 MED ORDER — NITROGLYCERIN 0.4 MG SL SUBL: SUBLINGUAL_TABLET | SUBLINGUAL | 5 refills | Status: DC

## 2018-03-15 NOTE — Telephone Encounter (Signed)
Pt's medication was sent to pt's pharmacy as requested. Confirmation received.  °

## 2018-03-16 ENCOUNTER — Ambulatory Visit (HOSPITAL_COMMUNITY)
Admission: RE | Admit: 2018-03-16 | Discharge: 2018-03-16 | Disposition: A | Discharge status: Home / Self Care | Payer: Medicare Other | Source: Ambulatory Visit | Attending: Internal Medicine | Admitting: Internal Medicine

## 2018-03-16 DIAGNOSIS — M810 Age-related osteoporosis without current pathological fracture: Secondary | ICD-10-CM | POA: Insufficient documentation

## 2018-03-16 MED ORDER — DENOSUMAB 60 MG/ML ~~LOC~~ SOSY
60.0000 mg | PREFILLED_SYRINGE | Freq: Once | SUBCUTANEOUS | Status: AC
  Administered 2018-03-16: 60 mg via SUBCUTANEOUS
  Filled 2018-03-16: qty 1

## 2018-04-07 ENCOUNTER — Ambulatory Visit (HOSPITAL_COMMUNITY): Payer: Medicare Other

## 2018-07-13 DIAGNOSIS — Z01419 Encounter for gynecological examination (general) (routine) without abnormal findings: Secondary | ICD-10-CM | POA: Diagnosis not present

## 2018-07-13 DIAGNOSIS — Z6830 Body mass index (BMI) 30.0-30.9, adult: Secondary | ICD-10-CM | POA: Diagnosis not present

## 2018-07-13 DIAGNOSIS — Z23 Encounter for immunization: Secondary | ICD-10-CM | POA: Diagnosis not present

## 2018-07-13 DIAGNOSIS — Z1151 Encounter for screening for human papillomavirus (HPV): Secondary | ICD-10-CM | POA: Diagnosis not present

## 2018-07-13 DIAGNOSIS — N952 Postmenopausal atrophic vaginitis: Secondary | ICD-10-CM | POA: Diagnosis not present

## 2018-08-31 DIAGNOSIS — Z01419 Encounter for gynecological examination (general) (routine) without abnormal findings: Secondary | ICD-10-CM | POA: Diagnosis not present

## 2018-08-31 DIAGNOSIS — Z124 Encounter for screening for malignant neoplasm of cervix: Secondary | ICD-10-CM | POA: Diagnosis not present

## 2018-08-31 DIAGNOSIS — N952 Postmenopausal atrophic vaginitis: Secondary | ICD-10-CM | POA: Diagnosis not present

## 2018-09-06 DIAGNOSIS — H2512 Age-related nuclear cataract, left eye: Secondary | ICD-10-CM | POA: Diagnosis not present

## 2018-09-16 DIAGNOSIS — K219 Gastro-esophageal reflux disease without esophagitis: Secondary | ICD-10-CM | POA: Diagnosis not present

## 2018-09-16 DIAGNOSIS — Z6830 Body mass index (BMI) 30.0-30.9, adult: Secondary | ICD-10-CM | POA: Diagnosis not present

## 2018-09-16 DIAGNOSIS — J302 Other seasonal allergic rhinitis: Secondary | ICD-10-CM | POA: Diagnosis not present

## 2018-09-16 DIAGNOSIS — I10 Essential (primary) hypertension: Secondary | ICD-10-CM | POA: Diagnosis not present

## 2018-09-16 DIAGNOSIS — M810 Age-related osteoporosis without current pathological fracture: Secondary | ICD-10-CM | POA: Diagnosis not present

## 2018-09-16 DIAGNOSIS — N952 Postmenopausal atrophic vaginitis: Secondary | ICD-10-CM | POA: Diagnosis not present

## 2018-09-16 DIAGNOSIS — I2510 Atherosclerotic heart disease of native coronary artery without angina pectoris: Secondary | ICD-10-CM | POA: Diagnosis not present

## 2018-09-16 DIAGNOSIS — I119 Hypertensive heart disease without heart failure: Secondary | ICD-10-CM | POA: Diagnosis not present

## 2018-09-16 DIAGNOSIS — E7800 Pure hypercholesterolemia, unspecified: Secondary | ICD-10-CM | POA: Diagnosis not present

## 2018-09-16 DIAGNOSIS — F3341 Major depressive disorder, recurrent, in partial remission: Secondary | ICD-10-CM | POA: Diagnosis not present

## 2018-12-29 ENCOUNTER — Other Ambulatory Visit: Payer: Self-pay | Admitting: Cardiovascular Disease

## 2018-12-30 ENCOUNTER — Other Ambulatory Visit: Payer: Self-pay | Admitting: Cardiovascular Disease

## 2019-01-05 ENCOUNTER — Ambulatory Visit (INDEPENDENT_AMBULATORY_CARE_PROVIDER_SITE_OTHER): Payer: Medicare Other | Admitting: Cardiovascular Disease

## 2019-01-05 ENCOUNTER — Encounter: Payer: Self-pay | Admitting: Cardiovascular Disease

## 2019-01-05 VITALS — BP 136/84 | HR 67 | Ht 62.0 in | Wt 165.8 lb

## 2019-01-05 DIAGNOSIS — I10 Essential (primary) hypertension: Secondary | ICD-10-CM

## 2019-01-05 DIAGNOSIS — E7800 Pure hypercholesterolemia, unspecified: Secondary | ICD-10-CM | POA: Diagnosis not present

## 2019-01-05 DIAGNOSIS — I2510 Atherosclerotic heart disease of native coronary artery without angina pectoris: Secondary | ICD-10-CM | POA: Diagnosis not present

## 2019-01-05 MED ORDER — CLOPIDOGREL BISULFATE 75 MG PO TABS: 75.0000 mg | ORAL_TABLET | Freq: Every day | ORAL | 11 refills | Status: DC

## 2019-01-05 NOTE — Progress Notes (Signed)
Chief Complaint  Patient presents with  . Follow-up    CAD    History of Present Illness: 73 yo female with history of CAD, HTN, HLD who is here today for cardiac follow up. She was admitted to [facility] December 2013 with an inferior STEMI. She suffered cardiac arrest en route requiring CPR and defibrillation. Emergent cardiac cath showed proximal RCA occlusion with irregularities in the LAD and Circumflex. A drug eluting stent was placed in the proximal RCA. Echo 10/30/12 with moderate LVH, EF 55-60%, normal wall motion, PASP 45. She required temporary pacemaker implantation for bradycardia during the cath. She had recurrent ventricular fibrillation post PCI requiring defibrillation. She also required IABP briefly for cardiogenic shock. Just before her event, she tested positive for H1N1 flu. Echo February 2018 with normal LV function. Contrast used and did not demonstrate LV thrombus.   She is here today for follow up. The patient denies any chest pain, dyspnea, palpitations, lower extremity edema, orthopnea, PND, dizziness, near syncope or syncope. She is exercising 3 days per week.   Primary Care Physician:  Haywood Pao, MD  Past Medical History:  Diagnosis Date  . CAD (coronary artery disease)    a. s/p INF-LAT STEMI => s/p DES-RCA c/b VF arrest and cardiogenic shock  . Cardiac arrest ([place]) 10/2012   STEMI with VF arrest x 2   . GERD (gastroesophageal reflux disease)   . H1N1 influenza 10/2012  . Hx of echocardiogram    a. Echo 10/30/12: Moderate LVH, EF 55-60%, normal wall motion, PASP 45  . Hyperlipidemia   . Hypertension   . Pneumonia 10/2012   a. STEMI c/b LLL pneumonia in setting of recent H1N1 influenza  . PUD (peptic ulcer disease)   . Syncope 1996   reported episode while driving in [place] in 1996 with full cardiac workup = negative  . Tobacco abuse     Past Surgical History:  Procedure Laterality Date  . BREAST EXCISIONAL BIOPSY     left  breast ? 1981  . CORONARY ANGIOPLASTY WITH STENT PLACEMENT     s/p inferior STEMI c/b VF arrest and cardiogenic shock requiring IABP  . INTRA-AORTIC BALLOON PUMP INSERTION  10/29/2012   Procedure: INTRA-AORTIC BALLOON PUMP INSERTION;  Surgeon: Burnell Blanks, MD;  Location: [facility] CATH LAB;  Service: Cardiovascular;;  . LEFT HEART CATHETERIZATION WITH CORONARY ANGIOGRAM N/A 10/29/2012   Procedure: LEFT HEART CATHETERIZATION WITH CORONARY ANGIOGRAM;  Surgeon: Burnell Blanks, MD;  Location: [facility] CATH LAB;  Service: Cardiovascular;  Laterality: N/A;  . PERCUTANEOUS CORONARY STENT INTERVENTION (PCI-S)  10/29/2012   Procedure: PERCUTANEOUS CORONARY STENT INTERVENTION (PCI-S);  Surgeon: Burnell Blanks, MD;  Location: [facility] CATH LAB;  Service: Cardiovascular;;  . TUBAL LIGATION      Current Outpatient Medications  Medication Sig Dispense Refill  . acetaminophen (TYLENOL) 325 MG tablet Take 650 mg by mouth every 6 (six) hours as needed. For headache or pain    . atorvastatin (LIPITOR) 80 MG tablet Take 1 tablet (80 mg total) by mouth daily at 6 PM. 30 tablet 1  . escitalopram (LEXAPRO) 5 MG tablet Take 5 mg by mouth daily.    . furosemide (LASIX) 40 MG tablet Take 1 tablet (40 mg total) by mouth daily. 30 tablet 1  . Krill Oil 500 MG CAPS Take 1 capsule by mouth daily.    [name] lisinopril (PRINIVIL,ZESTRIL) 5 MG tablet Take 1 tablet (5 mg total) by mouth daily. 30 tablet 1  .  metoprolol tartrate (LOPRESSOR) 25 MG tablet Take 1 tablet (25 mg total) by mouth 2 (two) times daily. 60 tablet 1  . nitroGLYCERIN (NITROSTAT) 0.4 MG SL tablet DISSOLVE ONE TABLET UNDER THE TONGUE EVERY 5 MINUTES AS NEEDED FOR CHEST PAIN.  DO NOT EXCEED A TOTAL OF 3 DOSES IN 15 MINUTES 25 tablet 5  . pantoprazole (PROTONIX) 40 MG tablet TAKE 1 TABLET BY MOUTH ONCE DAILY 30 tablet 0  . Probiotic Product (PROBIOTIC PO) Take 1 capsule by mouth daily.    . psyllium (REGULOID) 0.52 G capsule Take 0.52 g by mouth daily.      . clopidogrel (PLAVIX) 75 MG tablet Take 1 tablet (75 mg total) by mouth daily. 30 tablet 11   No current facility-administered medications for this visit.     Allergies  Allergen Reactions  . Shellfish Allergy Nausea And Vomiting  . Codeine Other (See Comments)    Makes me hyperactive and not able to sleep    Social History   Socioeconomic History  . Marital status: Married    Spouse name: Not on file  . Number of children: Not on file  . Years of education: Not on file  . Highest education level: Not on file  Occupational History  . Not on file  Social Needs  . Financial resource strain: Not on file  . Food insecurity:    Worry: Not on file    Inability: Not on file  . Transportation needs:    Medical: Not on file    Non-medical: Not on file  Tobacco Use  . Smoking status: Former Smoker    Packs/day: 1.00    Years: 20.00    Pack years: 20.00    Types: Cigarettes    Last attempt to quit: 11/05/2012    Years since quitting: 6.1  . Smokeless tobacco: Former Systems developer    Quit date: 11/30/2011  Substance and Sexual Activity  . Alcohol use: No  . Drug use: No  . Sexual activity: Not on file  Lifestyle  . Physical activity:    Days per week: Not on file    Minutes per session: Not on file  . Stress: Not on file  Relationships  . Social connections:    Talks on phone: Not on file    Gets together: Not on file    Attends religious service: Not on file    Active member of club or organization: Not on file    Attends meetings of clubs or organizations: Not on file    Relationship status: Not on file  . Intimate partner violence:    Fear of current or ex partner: Not on file    Emotionally abused: Not on file    Physically abused: Not on file    Forced sexual activity: Not on file  Other Topics Concern  . Not on file  Social History Narrative  . Not on file    Family History  Problem Relation Age of Onset  . Heart attack Brother   . Stroke Maternal Grandmother      Review of Systems:  As stated in the HPI and otherwise negative.   BP 136/84   Pulse 67   Ht 5\' 2"  (1.575 m)   Wt 75.2 kg   SpO2 94%   BMI 30.33 kg/m   Physical Examination:  General: Well developed, well nourished, NAD  HEENT: OP clear, mucus membranes moist  SKIN: warm, dry. No rashes. Neuro: No focal deficits  Musculoskeletal: Muscle strength  5/5 all ext  Psychiatric: Mood and affect normal  Neck: No JVD, no carotid bruits, no thyromegaly, no lymphadenopathy.  Lungs:Clear bilaterally, no wheezes, rhonci, crackles Cardiovascular: Regular rate and rhythm. No murmurs, gallops or rubs. Abdomen:Soft. Bowel sounds present. Non-tender.  Extremities: No lower extremity edema. Pulses are 2 + in the bilateral DP/PT.  Echo February 2018: - Left ventricle: The cavity size was normal. Wall thickness was   increased in a pattern of moderate LVH. Systolic function was   normal. The estimated ejection fraction was in the range of 60%   to 65%. Wall motion was normal; there were no regional wall   motion abnormalities. Doppler parameters are consistent with   abnormal left ventricular relaxation (grade 1 diastolic   dysfunction). - Aortic valve: There was trivial regurgitation. - Mitral valve: There was mild regurgitation. - Pulmonary arteries: Systolic pressure was mildly increased. PA   peak pressure: 34 mm Hg (S).  EKG:  EKG is ordered today. The ekg ordered today demonstrates NSR, rate 67bpm. Old inferior MI. Poor R wave progression precordial leads.  Recent Labs: No results found for requested labs within last 8760 hours.    Wt Readings from Last 3 Encounters:  01/05/19 75.2 kg  12/23/17 74.4 kg  10/07/17 73.6 kg     Other studies Reviewed: Additional studies/ records that were reviewed today include: . Review of the above records demonstrates:    Assessment and Plan:   1. CAD without angina: She has no chest pain. Will continue beta blocker, statin and Plavix.  Will stop ASA.    2. HTN: BP is well controlled.   3. Hyperlipidemia: Lipids followed in primary care. LDL at goal. Will continue statin.   Current medicines are reviewed at length with the patient today.  The patient does not have concerns regarding medicines.  The following changes have been made:  no change  Labs/ tests ordered today include:   Orders Placed This Encounter  Procedures  . EKG 12-Lead    Disposition:   FU with me in 12  months  Signed, Lauree Chandler, MD 01/05/2019 4:09 PM    [facility] [place], [place], [place]  [address] Phone: [phone]; Fax: [phone]

## 2019-01-05 NOTE — Patient Instructions (Signed)
Medication Instructions:  Your physician has recommended you make the following change in your medication: Stop aspirin  If you need a refill on your cardiac medications before your next appointment, please call your pharmacy.   Lab work: none If you have labs (blood work) drawn today and your tests are completely normal, you will receive your results only by: [name] MyChart Message (if you have MyChart) OR . A paper copy in the mail If you have any lab test that is abnormal or we need to change your treatment, we will call you to review the results.  Testing/Procedures: none  Follow-Up: At [facility], you and your health needs are our priority.  As part of our continuing mission to provide you with exceptional heart care, we have created designated Provider Care Teams.  These Care Teams include your primary Cardiologist (physician) and Advanced Practice Providers (APPs -  Physician Assistants and Nurse Practitioners) who all work together to provide you with the care you need, when you need it. You will need a follow up appointment in 12 months.  Please call our office 2 months in advance to schedule this appointment.  You may see Lauree Chandler, MD or one of the following Advanced Practice Providers on your designated Care Team:   [place], PA-C Melina Copa, PA-C . Ermalinda Barrios, PA-C  Any Other Special Instructions Will Be Listed Below (If Applicable).

## 2019-02-04 ENCOUNTER — Other Ambulatory Visit: Payer: Self-pay | Admitting: Internal Medicine

## 2019-02-04 DIAGNOSIS — Z1231 Encounter for screening mammogram for malignant neoplasm of breast: Secondary | ICD-10-CM

## 2019-03-07 DIAGNOSIS — I10 Essential (primary) hypertension: Secondary | ICD-10-CM | POA: Diagnosis not present

## 2019-03-07 DIAGNOSIS — M810 Age-related osteoporosis without current pathological fracture: Secondary | ICD-10-CM | POA: Diagnosis not present

## 2019-03-07 DIAGNOSIS — E7800 Pure hypercholesterolemia, unspecified: Secondary | ICD-10-CM | POA: Diagnosis not present

## 2019-03-08 DIAGNOSIS — I10 Essential (primary) hypertension: Secondary | ICD-10-CM | POA: Diagnosis not present

## 2019-03-08 DIAGNOSIS — R82998 Other abnormal findings in urine: Secondary | ICD-10-CM | POA: Diagnosis not present

## 2019-03-17 DIAGNOSIS — I252 Old myocardial infarction: Secondary | ICD-10-CM | POA: Diagnosis not present

## 2019-03-17 DIAGNOSIS — I119 Hypertensive heart disease without heart failure: Secondary | ICD-10-CM | POA: Diagnosis not present

## 2019-03-17 DIAGNOSIS — J302 Other seasonal allergic rhinitis: Secondary | ICD-10-CM | POA: Diagnosis not present

## 2019-03-17 DIAGNOSIS — Z0000 Encounter for general adult medical examination without abnormal findings: Secondary | ICD-10-CM | POA: Diagnosis not present

## 2019-03-17 DIAGNOSIS — Z1331 Encounter for screening for depression: Secondary | ICD-10-CM | POA: Diagnosis not present

## 2019-03-17 DIAGNOSIS — M810 Age-related osteoporosis without current pathological fracture: Secondary | ICD-10-CM | POA: Diagnosis not present

## 2019-03-17 DIAGNOSIS — N390 Urinary tract infection, site not specified: Secondary | ICD-10-CM | POA: Diagnosis not present

## 2019-03-17 DIAGNOSIS — E7800 Pure hypercholesterolemia, unspecified: Secondary | ICD-10-CM | POA: Diagnosis not present

## 2019-03-17 DIAGNOSIS — F3341 Major depressive disorder, recurrent, in partial remission: Secondary | ICD-10-CM | POA: Diagnosis not present

## 2019-03-17 DIAGNOSIS — Z1339 Encounter for screening examination for other mental health and behavioral disorders: Secondary | ICD-10-CM | POA: Diagnosis not present

## 2019-03-17 DIAGNOSIS — K219 Gastro-esophageal reflux disease without esophagitis: Secondary | ICD-10-CM | POA: Diagnosis not present

## 2019-03-17 DIAGNOSIS — I2510 Atherosclerotic heart disease of native coronary artery without angina pectoris: Secondary | ICD-10-CM | POA: Diagnosis not present

## 2019-03-30 ENCOUNTER — Ambulatory Visit
Admission: RE | Admit: 2019-03-30 | Discharge: 2019-03-30 | Disposition: A | Discharge status: Home / Self Care | Payer: Medicare Other | Source: Ambulatory Visit | Attending: Internal Medicine | Admitting: Internal Medicine

## 2019-03-30 ENCOUNTER — Other Ambulatory Visit: Payer: Self-pay

## 2019-03-30 DIAGNOSIS — Z1231 Encounter for screening mammogram for malignant neoplasm of breast: Secondary | ICD-10-CM

## 2019-03-30 IMAGING — MG DIGITAL SCREENING BILATERAL MAMMOGRAM WITH TOMO AND CAD
8 series · 8 of 24 positions shown · non-contrast
Comparison: Previous exam(s).

CLINICAL DATA: Screening.

EXAM:
DIGITAL SCREENING BILATERAL MAMMOGRAM WITH TOMO AND CAD

[L CC synth-2D]
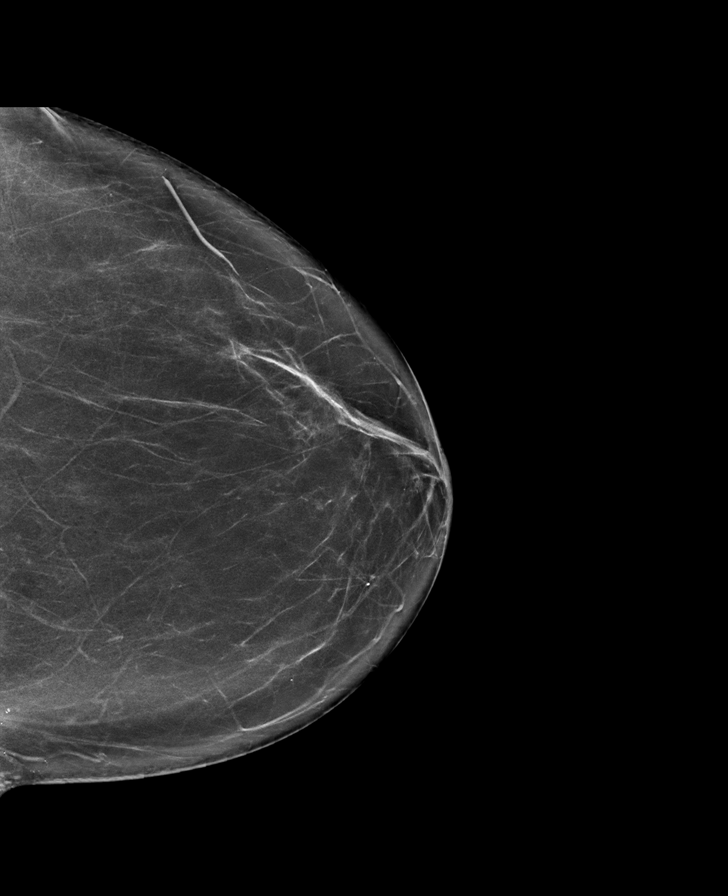

[R MLO synth-2D]
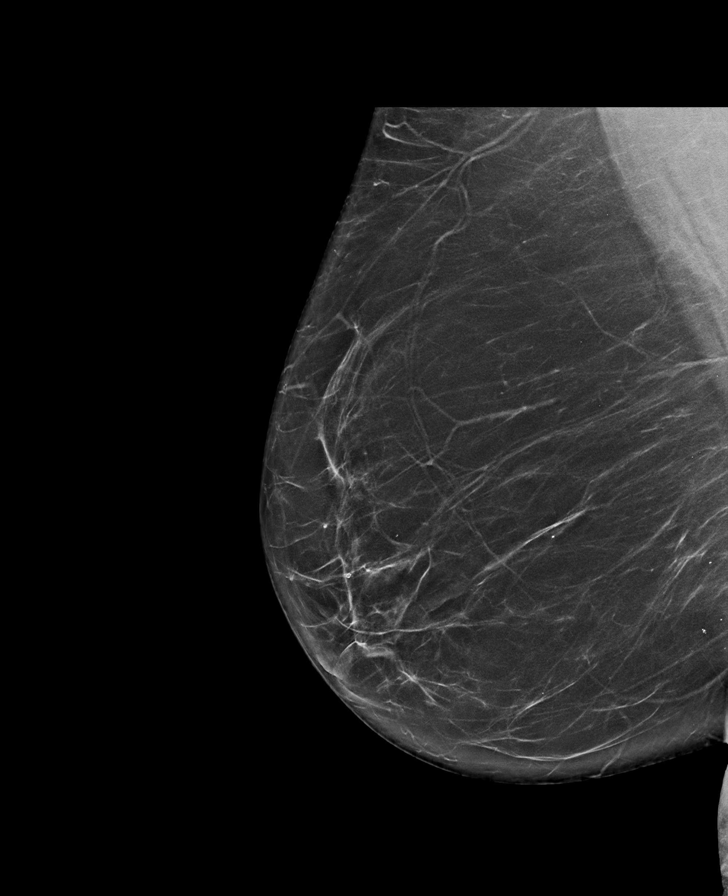

[R CC synth-2D]
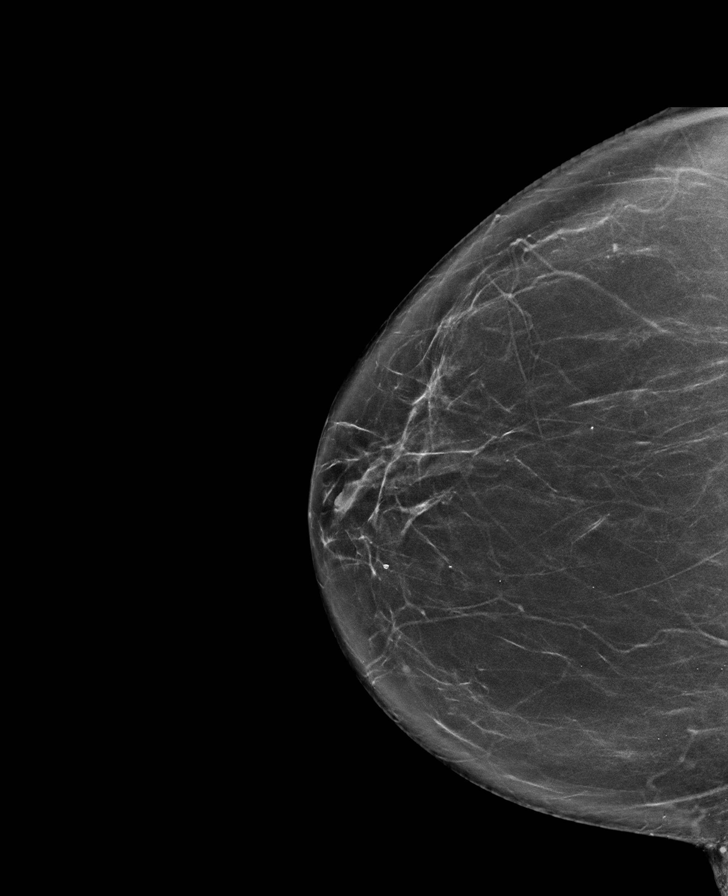

[L MLO synth-2D]
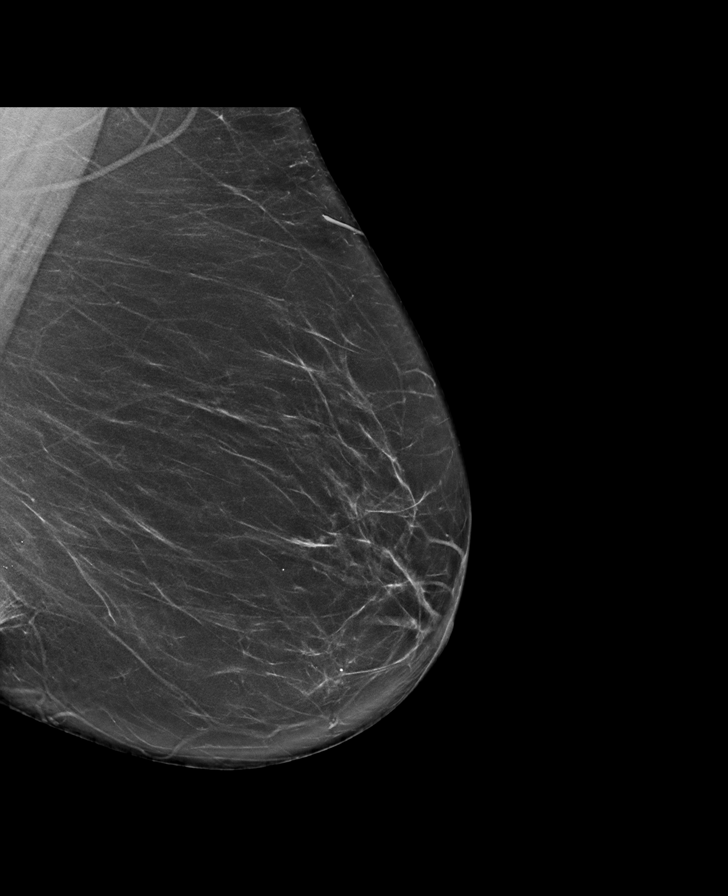

[R MLO tomo · tomo slice 40/79.0]
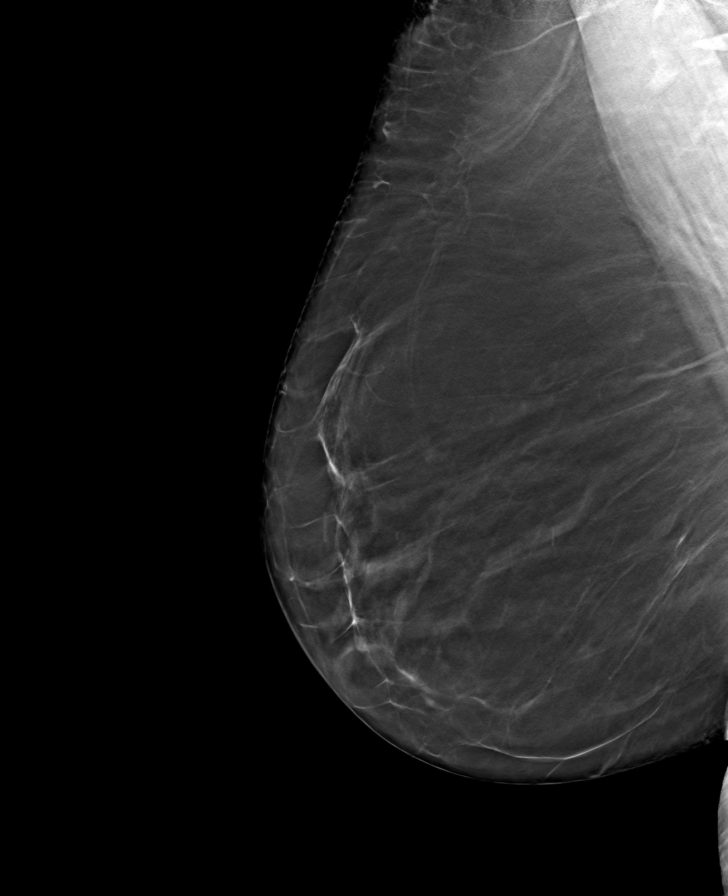

[L CC tomo · tomo slice 38/75.0]
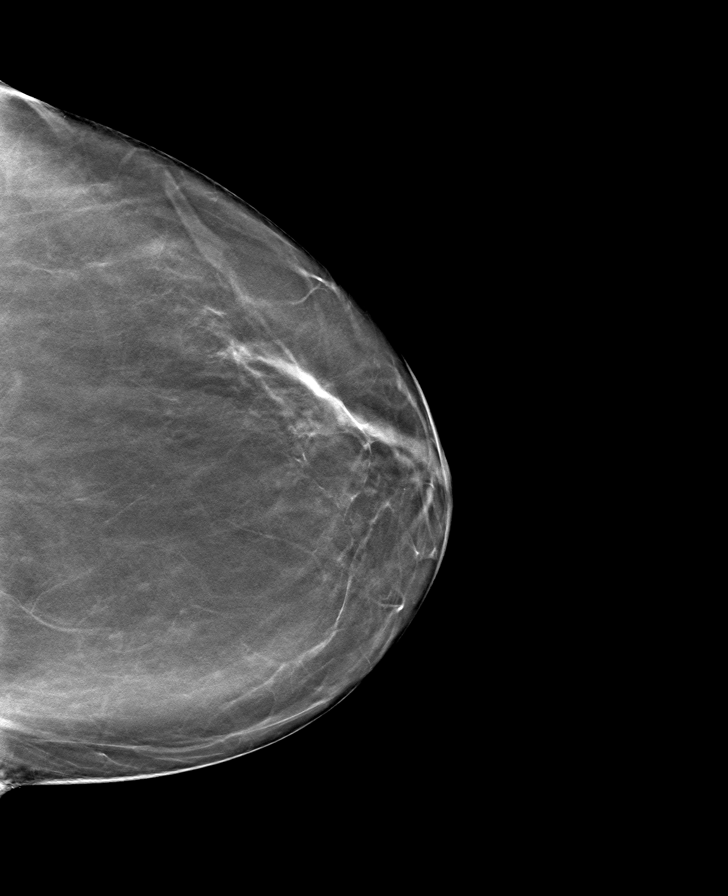

[L MLO tomo · tomo slice 39/76.0]
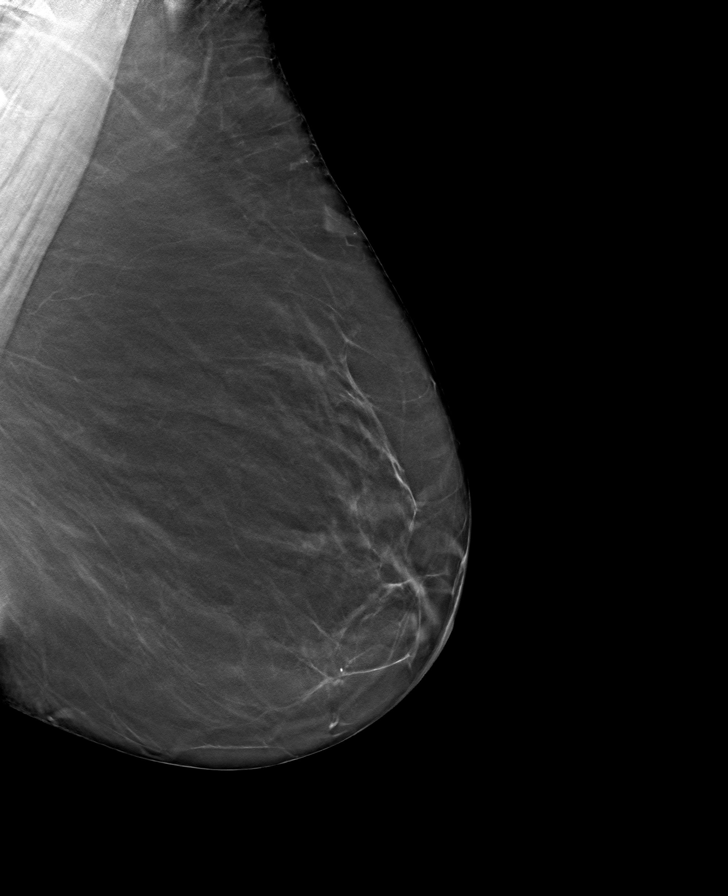

[R CC tomo · tomo slice 39/76.0]
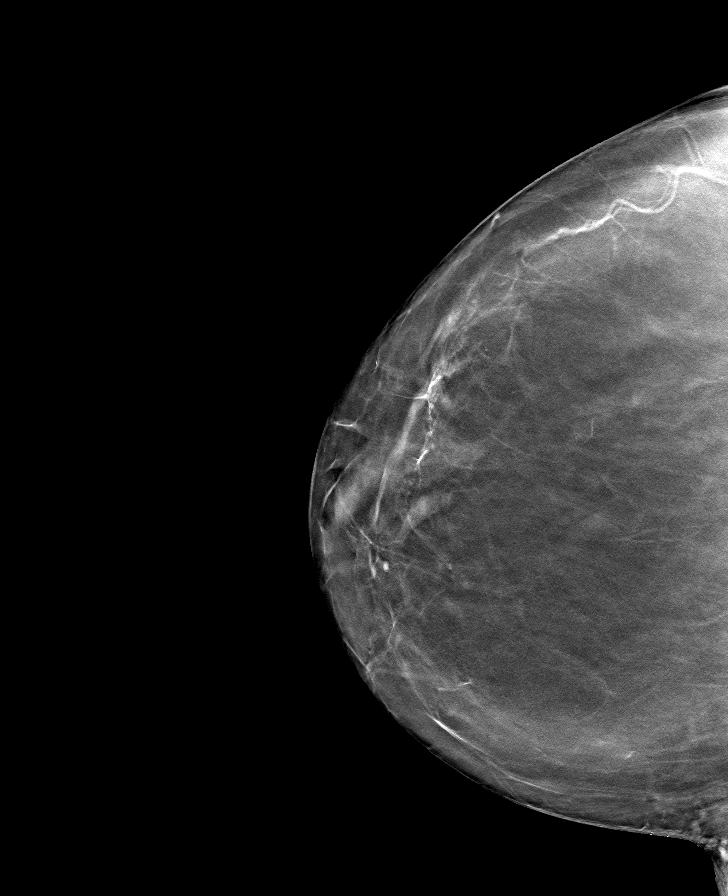

[8 of 24 positions shown; findings below may reference images not displayed]

ACR Breast Density Category b: There are scattered areas of
fibroglandular density.
FINDINGS: There are no findings suspicious for malignancy. Images were
processed with CAD.
IMPRESSION: No mammographic evidence of malignancy. A result letter of this
screening mammogram will be mailed directly to the patient.

RECOMMENDATION:
Screening mammogram in one year. (Code:[TQ])

BI-RADS CATEGORY  1: Negative.

## 2019-03-31 ENCOUNTER — Ambulatory Visit (HOSPITAL_COMMUNITY)
Admission: RE | Admit: 2019-03-31 | Discharge: 2019-03-31 | Disposition: A | Discharge status: Home / Self Care | Payer: Medicare Other | Source: Ambulatory Visit | Attending: Internal Medicine | Admitting: Internal Medicine

## 2019-03-31 DIAGNOSIS — M810 Age-related osteoporosis without current pathological fracture: Secondary | ICD-10-CM | POA: Insufficient documentation

## 2019-03-31 MED ORDER — DENOSUMAB 60 MG/ML ~~LOC~~ SOSY
60.0000 mg | PREFILLED_SYRINGE | Freq: Once | SUBCUTANEOUS | Status: AC
  Administered 2019-03-31: 60 mg via SUBCUTANEOUS

## 2019-03-31 MED ORDER — DENOSUMAB 60 MG/ML ~~LOC~~ SOSY
PREFILLED_SYRINGE | SUBCUTANEOUS | Status: AC
  Administered 2019-03-31: 60 mg via SUBCUTANEOUS
  Filled 2019-03-31: qty 1

## 2019-04-07 ENCOUNTER — Ambulatory Visit: Payer: Medicare Other

## 2019-04-27 ENCOUNTER — Other Ambulatory Visit: Payer: Self-pay | Admitting: Cardiovascular Disease

## 2019-07-12 ENCOUNTER — Other Ambulatory Visit: Payer: Self-pay | Admitting: Cardiovascular Disease

## 2019-08-04 DIAGNOSIS — Z23 Encounter for immunization: Secondary | ICD-10-CM | POA: Diagnosis not present

## 2019-09-08 DIAGNOSIS — H02831 Dermatochalasis of right upper eyelid: Secondary | ICD-10-CM | POA: Diagnosis not present

## 2019-09-20 DIAGNOSIS — F3341 Major depressive disorder, recurrent, in partial remission: Secondary | ICD-10-CM | POA: Diagnosis not present

## 2019-09-20 DIAGNOSIS — N952 Postmenopausal atrophic vaginitis: Secondary | ICD-10-CM | POA: Diagnosis not present

## 2019-09-20 DIAGNOSIS — I119 Hypertensive heart disease without heart failure: Secondary | ICD-10-CM | POA: Diagnosis not present

## 2019-09-20 DIAGNOSIS — I252 Old myocardial infarction: Secondary | ICD-10-CM | POA: Diagnosis not present

## 2019-09-20 DIAGNOSIS — M810 Age-related osteoporosis without current pathological fracture: Secondary | ICD-10-CM | POA: Diagnosis not present

## 2019-09-20 DIAGNOSIS — I2510 Atherosclerotic heart disease of native coronary artery without angina pectoris: Secondary | ICD-10-CM | POA: Diagnosis not present

## 2019-09-20 DIAGNOSIS — E7800 Pure hypercholesterolemia, unspecified: Secondary | ICD-10-CM | POA: Diagnosis not present

## 2019-09-20 DIAGNOSIS — J302 Other seasonal allergic rhinitis: Secondary | ICD-10-CM | POA: Diagnosis not present

## 2019-09-20 DIAGNOSIS — J45909 Unspecified asthma, uncomplicated: Secondary | ICD-10-CM | POA: Diagnosis not present

## 2019-09-20 DIAGNOSIS — K219 Gastro-esophageal reflux disease without esophagitis: Secondary | ICD-10-CM | POA: Diagnosis not present

## 2019-10-04 ENCOUNTER — Encounter (HOSPITAL_COMMUNITY): Payer: Medicare Other

## 2019-12-23 ENCOUNTER — Encounter (HOSPITAL_COMMUNITY): Payer: Medicare Other

## 2020-01-09 ENCOUNTER — Encounter (INDEPENDENT_AMBULATORY_CARE_PROVIDER_SITE_OTHER): Payer: Self-pay

## 2020-01-09 ENCOUNTER — Ambulatory Visit (INDEPENDENT_AMBULATORY_CARE_PROVIDER_SITE_OTHER): Payer: Medicare Other | Admitting: Cardiovascular Disease

## 2020-01-09 ENCOUNTER — Encounter: Payer: Self-pay | Admitting: Cardiovascular Disease

## 2020-01-09 ENCOUNTER — Other Ambulatory Visit: Payer: Self-pay

## 2020-01-09 ENCOUNTER — Other Ambulatory Visit (HOSPITAL_COMMUNITY): Payer: Self-pay | Admitting: *Deleted

## 2020-01-09 VITALS — BP 136/84 | HR 82 | Ht 62.0 in | Wt 177.8 lb

## 2020-01-09 DIAGNOSIS — I2510 Atherosclerotic heart disease of native coronary artery without angina pectoris: Secondary | ICD-10-CM | POA: Diagnosis not present

## 2020-01-09 DIAGNOSIS — E7800 Pure hypercholesterolemia, unspecified: Secondary | ICD-10-CM | POA: Diagnosis not present

## 2020-01-09 DIAGNOSIS — I10 Essential (primary) hypertension: Secondary | ICD-10-CM | POA: Diagnosis not present

## 2020-01-09 NOTE — Patient Instructions (Signed)

## 2020-01-09 NOTE — Progress Notes (Signed)
Chief Complaint  Patient presents with  . Follow-up    CAD   History of Present Illness: 74 yo female with history of CAD, HTN, HLD who is here today for cardiac follow up. She was admitted to [facility] December 2013 with an inferior STEMI. She suffered cardiac arrest en route requiring CPR and defibrillation. Emergent cardiac cath showed proximal RCA occlusion with irregularities in the LAD and Circumflex. A drug eluting stent was placed in the proximal RCA. Echo 10/30/12 with moderate LVH, EF 55-60%, normal wall motion, PASP 45. She required temporary pacemaker implantation for bradycardia during the cath. She had recurrent ventricular fibrillation post PCI requiring defibrillation. She also required IABP briefly for cardiogenic shock. Just before her event, she tested positive for H1N1 flu. Echo February 2018 with normal LV function. Contrast used and did not demonstrate LV thrombus.   She is here today for follow up. The patient denies any chest pain, dyspnea, palpitations, lower extremity edema, orthopnea, PND, dizziness, near syncope or syncope.   Primary Care Physician:  Haywood Pao, MD  Past Medical History:  Diagnosis Date  . CAD (coronary artery disease)    a. s/p INF-LAT STEMI => s/p DES-RCA c/b VF arrest and cardiogenic shock  . Cardiac arrest ([place]) 10/2012   STEMI with VF arrest x 2   . GERD (gastroesophageal reflux disease)   . H1N1 influenza 10/2012  . Hx of echocardiogram    a. Echo 10/30/12: Moderate LVH, EF 55-60%, normal wall motion, PASP 45  . Hyperlipidemia   . Hypertension   . Pneumonia 10/2012   a. STEMI c/b LLL pneumonia in setting of recent H1N1 influenza  . PUD (peptic ulcer disease)   . Syncope 1996   reported episode while driving in [place] in 1996 with full cardiac workup = negative  . Tobacco abuse     Past Surgical History:  Procedure Laterality Date  . BREAST EXCISIONAL BIOPSY     left breast ? 1981  . CORONARY ANGIOPLASTY  WITH STENT PLACEMENT     s/p inferior STEMI c/b VF arrest and cardiogenic shock requiring IABP  . INTRA-AORTIC BALLOON PUMP INSERTION  10/29/2012   Procedure: INTRA-AORTIC BALLOON PUMP INSERTION;  Surgeon: Burnell Blanks, MD;  Location: [facility] CATH LAB;  Service: Cardiovascular;;  . LEFT HEART CATHETERIZATION WITH CORONARY ANGIOGRAM N/A 10/29/2012   Procedure: LEFT HEART CATHETERIZATION WITH CORONARY ANGIOGRAM;  Surgeon: Burnell Blanks, MD;  Location: [facility] CATH LAB;  Service: Cardiovascular;  Laterality: N/A;  . PERCUTANEOUS CORONARY STENT INTERVENTION (PCI-S)  10/29/2012   Procedure: PERCUTANEOUS CORONARY STENT INTERVENTION (PCI-S);  Surgeon: Burnell Blanks, MD;  Location: [facility] CATH LAB;  Service: Cardiovascular;;  . TUBAL LIGATION      Current Outpatient Medications  Medication Sig Dispense Refill  . acetaminophen (TYLENOL) 325 MG tablet Take 650 mg by mouth every 6 (six) hours as needed. For headache or pain    . atorvastatin (LIPITOR) 80 MG tablet Take 1 tablet (80 mg total) by mouth daily at 6 PM. 30 tablet 1  . clopidogrel (PLAVIX) 75 MG tablet Take 1 tablet (75 mg total) by mouth daily. 30 tablet 11  . escitalopram (LEXAPRO) 5 MG tablet Take 5 mg by mouth daily.    . furosemide (LASIX) 40 MG tablet Take 1 tablet (40 mg total) by mouth daily. 30 tablet 1  . Krill Oil 500 MG CAPS Take 1 capsule by mouth daily.    [name] lisinopril (PRINIVIL,ZESTRIL) 5 MG tablet Take  1 tablet (5 mg total) by mouth daily. 30 tablet 1  . metoprolol tartrate (LOPRESSOR) 25 MG tablet Take 1 tablet (25 mg total) by mouth 2 (two) times daily. 60 tablet 1  . nitroGLYCERIN (NITROSTAT) 0.4 MG SL tablet DISSOLVE ONE TABLET UNDER THE TONGUE EVERY FIVE MINUTES AS NEEDED FOR CHEST PAIN. DO NOT EXCEED A TOTAL OF THREE DOSES IN 15 MINUTES 25 tablet 3  . pantoprazole (PROTONIX) 40 MG tablet Take 1 tablet (40 mg total) by mouth daily. 90 tablet 2  . Probiotic Product (PROBIOTIC PO) Take 1 capsule by mouth daily.     . psyllium (REGULOID) 0.52 G capsule Take 0.52 g by mouth daily.     No current facility-administered medications for this visit.    Allergies  Allergen Reactions  . Shellfish Allergy Nausea And Vomiting  . Codeine Other (See Comments)    Makes me hyperactive and not able to sleep    Social History   Socioeconomic History  . Marital status: Married    Spouse name: Not on file  . Number of children: Not on file  . Years of education: Not on file  . Highest education level: Not on file  Occupational History  . Not on file  Tobacco Use  . Smoking status: Former Smoker    Packs/day: 1.00    Years: 20.00    Pack years: 20.00    Types: Cigarettes    Quit date: 11/05/2012    Years since quitting: 7.1  . Smokeless tobacco: Former Systems developer    Quit date: 11/30/2011  Substance and Sexual Activity  . Alcohol use: No  . Drug use: No  . Sexual activity: Not on file  Other Topics Concern  . Not on file  Social History Narrative  . Not on file   Social Determinants of Health   Financial Resource Strain:   . Difficulty of Paying Living Expenses: Not on file  Food Insecurity:   . Worried About Charity fundraiser in the Last Year: Not on file  . Ran Out of Food in the Last Year: Not on file  Transportation Needs:   . Lack of Transportation (Medical): Not on file  . Lack of Transportation (Non-Medical): Not on file  Physical Activity:   . Days of Exercise per Week: Not on file  . Minutes of Exercise per Session: Not on file  Stress:   . Feeling of Stress : Not on file  Social Connections:   . Frequency of Communication with Friends and Family: Not on file  . Frequency of Social Gatherings with Friends and Family: Not on file  . Attends Religious Services: Not on file  . Active Member of Clubs or Organizations: Not on file  . Attends Archivist Meetings: Not on file  . Marital Status: Not on file  Intimate Partner Violence:   . Fear of Current or Ex-Partner: Not  on file  . Emotionally Abused: Not on file  . Physically Abused: Not on file  . Sexually Abused: Not on file    Family History  Problem Relation Age of Onset  . Heart attack Brother   . Stroke Maternal Grandmother     Review of Systems:  As stated in the HPI and otherwise negative.   BP 136/84   Pulse 82   Ht 5\' 2"  (1.575 m)   Wt 177 lb 12.8 oz (80.6 kg)   SpO2 97%   BMI 32.52 kg/m   Physical Examination:  General: Well  developed, well nourished, NAD  HEENT: OP clear, mucus membranes moist  SKIN: warm, dry. No rashes. Neuro: No focal deficits  Musculoskeletal: Muscle strength 5/5 all ext  Psychiatric: Mood and affect normal  Neck: No JVD, no carotid bruits, no thyromegaly, no lymphadenopathy.  Lungs:Clear bilaterally, no wheezes, rhonci, crackles Cardiovascular: Regular rate and rhythm. No murmurs, gallops or rubs. Abdomen:Soft. Bowel sounds present. Non-tender.  Extremities: No lower extremity edema. Pulses are 2 + in the bilateral DP/PT.  Echo February 2018: - Left ventricle: The cavity size was normal. Wall thickness was   increased in a pattern of moderate LVH. Systolic function was   normal. The estimated ejection fraction was in the range of 60%   to 65%. Wall motion was normal; there were no regional wall   motion abnormalities. Doppler parameters are consistent with   abnormal left ventricular relaxation (grade 1 diastolic   dysfunction). - Aortic valve: There was trivial regurgitation. - Mitral valve: There was mild regurgitation. - Pulmonary arteries: Systolic pressure was mildly increased. PA   peak pressure: 34 mm Hg (S).  EKG:  EKG is  ordered today. The ekg ordered today demonstrates NSR, rate 82 bpm.   Recent Labs: No results found for requested labs within last 8760 hours.    Wt Readings from Last 3 Encounters:  01/09/20 177 lb 12.8 oz (80.6 kg)  01/05/19 165 lb 12.8 oz (75.2 kg)  12/23/17 164 lb (74.4 kg)     Other studies  Reviewed: Additional studies/ records that were reviewed today include: . Review of the above records demonstrates:    Assessment and Plan:   1. CAD without angina: No chest pain. Continue beta blocker, Plavix and statin.     2. HTN: BP is controlled. Continue current therapy.   3. Hyperlipidemia: Lipids followed in primary care. Continue statin.    Current medicines are reviewed at length with the patient today.  The patient does not have concerns regarding medicines.  The following changes have been made:  no change  Labs/ tests ordered today include:   No orders of the defined types were placed in this encounter.  Disposition:   FU with me in 12  months  Signed, Lauree Chandler, MD 01/09/2020 3:49 PM    [facility] [place], [place], [place]  [address] Phone: [phone]; Fax: [phone]

## 2020-01-10 ENCOUNTER — Ambulatory Visit (HOSPITAL_COMMUNITY)
Admission: RE | Admit: 2020-01-10 | Discharge: 2020-01-10 | Disposition: A | Discharge status: Home / Self Care | Payer: Medicare Other | Source: Ambulatory Visit | Attending: Internal Medicine | Admitting: Internal Medicine

## 2020-01-10 DIAGNOSIS — M810 Age-related osteoporosis without current pathological fracture: Secondary | ICD-10-CM | POA: Diagnosis not present

## 2020-01-10 MED ORDER — DENOSUMAB 60 MG/ML ~~LOC~~ SOSY
PREFILLED_SYRINGE | SUBCUTANEOUS | Status: AC
  Administered 2020-01-10: 60 mg via SUBCUTANEOUS
  Filled 2020-01-10: qty 1

## 2020-01-10 MED ORDER — DENOSUMAB 60 MG/ML ~~LOC~~ SOSY: 60.0000 mg | PREFILLED_SYRINGE | Freq: Once | SUBCUTANEOUS | Status: AC

## 2020-01-13 NOTE — Addendum Note (Signed)
Addended by: Mendel Ryder on: 01/13/2020 10:32 AM   Modules accepted: Orders

## 2020-01-26 ENCOUNTER — Other Ambulatory Visit: Payer: Self-pay | Admitting: Cardiovascular Disease

## 2020-02-21 ENCOUNTER — Other Ambulatory Visit: Payer: Self-pay | Admitting: Internal Medicine

## 2020-02-21 DIAGNOSIS — Z1231 Encounter for screening mammogram for malignant neoplasm of breast: Secondary | ICD-10-CM

## 2020-03-10 ENCOUNTER — Inpatient Hospital Stay (HOSPITAL_COMMUNITY): Payer: Medicare Other

## 2020-03-10 ENCOUNTER — Inpatient Hospital Stay (HOSPITAL_COMMUNITY): Payer: Medicare Other | Admitting: Certified Registered Nurse Anesthetist

## 2020-03-10 ENCOUNTER — Encounter (HOSPITAL_COMMUNITY)
Admission: EM | Disposition: A | Discharge status: Home / Self Care | Payer: Self-pay | Source: Home / Self Care | Attending: Internal Medicine

## 2020-03-10 ENCOUNTER — Other Ambulatory Visit: Payer: Self-pay

## 2020-03-10 ENCOUNTER — Encounter (HOSPITAL_COMMUNITY): Payer: Self-pay | Admitting: Emergency Medicine

## 2020-03-10 ENCOUNTER — Inpatient Hospital Stay (HOSPITAL_COMMUNITY)
Admission: EM | Admit: 2020-03-10 | Discharge: 2020-03-12 | DRG: 378 | Disposition: A | Discharge status: Home / Self Care | Payer: Medicare Other | Attending: Internal Medicine | Admitting: Internal Medicine

## 2020-03-10 DIAGNOSIS — I252 Old myocardial infarction: Secondary | ICD-10-CM

## 2020-03-10 DIAGNOSIS — K264 Chronic or unspecified duodenal ulcer with hemorrhage: Principal | ICD-10-CM | POA: Diagnosis present

## 2020-03-10 DIAGNOSIS — D62 Acute posthemorrhagic anemia: Secondary | ICD-10-CM | POA: Diagnosis present

## 2020-03-10 DIAGNOSIS — K2970 Gastritis, unspecified, without bleeding: Secondary | ICD-10-CM | POA: Diagnosis present

## 2020-03-10 DIAGNOSIS — K3182 Dieulafoy lesion (hemorrhagic) of stomach and duodenum: Secondary | ICD-10-CM | POA: Diagnosis present

## 2020-03-10 DIAGNOSIS — K269 Duodenal ulcer, unspecified as acute or chronic, without hemorrhage or perforation: Secondary | ICD-10-CM

## 2020-03-10 DIAGNOSIS — R531 Weakness: Secondary | ICD-10-CM | POA: Diagnosis not present

## 2020-03-10 DIAGNOSIS — I2510 Atherosclerotic heart disease of native coronary artery without angina pectoris: Secondary | ICD-10-CM

## 2020-03-10 DIAGNOSIS — R61 Generalized hyperhidrosis: Secondary | ICD-10-CM | POA: Diagnosis present

## 2020-03-10 DIAGNOSIS — E785 Hyperlipidemia, unspecified: Secondary | ICD-10-CM | POA: Diagnosis present

## 2020-03-10 DIAGNOSIS — Z87891 Personal history of nicotine dependence: Secondary | ICD-10-CM | POA: Diagnosis not present

## 2020-03-10 DIAGNOSIS — Z955 Presence of coronary angioplasty implant and graft: Secondary | ICD-10-CM

## 2020-03-10 DIAGNOSIS — Z6832 Body mass index (BMI) 32.0-32.9, adult: Secondary | ICD-10-CM | POA: Diagnosis not present

## 2020-03-10 DIAGNOSIS — E7800 Pure hypercholesterolemia, unspecified: Secondary | ICD-10-CM | POA: Diagnosis not present

## 2020-03-10 DIAGNOSIS — R110 Nausea: Secondary | ICD-10-CM | POA: Diagnosis not present

## 2020-03-10 DIAGNOSIS — Z8711 Personal history of peptic ulcer disease: Secondary | ICD-10-CM | POA: Diagnosis not present

## 2020-03-10 DIAGNOSIS — Z8249 Family history of ischemic heart disease and other diseases of the circulatory system: Secondary | ICD-10-CM

## 2020-03-10 DIAGNOSIS — K922 Gastrointestinal hemorrhage, unspecified: Secondary | ICD-10-CM | POA: Diagnosis present

## 2020-03-10 DIAGNOSIS — I10 Essential (primary) hypertension: Secondary | ICD-10-CM | POA: Diagnosis present

## 2020-03-10 DIAGNOSIS — Z79899 Other long term (current) drug therapy: Secondary | ICD-10-CM

## 2020-03-10 DIAGNOSIS — K219 Gastro-esophageal reflux disease without esophagitis: Secondary | ICD-10-CM | POA: Diagnosis present

## 2020-03-10 DIAGNOSIS — Z7902 Long term (current) use of antithrombotics/antiplatelets: Secondary | ICD-10-CM

## 2020-03-10 DIAGNOSIS — E669 Obesity, unspecified: Secondary | ICD-10-CM | POA: Diagnosis present

## 2020-03-10 DIAGNOSIS — Z20822 Contact with and (suspected) exposure to covid-19: Secondary | ICD-10-CM | POA: Diagnosis not present

## 2020-03-10 DIAGNOSIS — I959 Hypotension, unspecified: Secondary | ICD-10-CM | POA: Diagnosis present

## 2020-03-10 DIAGNOSIS — Z91013 Allergy to seafood: Secondary | ICD-10-CM

## 2020-03-10 DIAGNOSIS — Z8674 Personal history of sudden cardiac arrest: Secondary | ICD-10-CM

## 2020-03-10 DIAGNOSIS — K2990 Gastroduodenitis, unspecified, without bleeding: Secondary | ICD-10-CM | POA: Diagnosis present

## 2020-03-10 DIAGNOSIS — Z885 Allergy status to narcotic agent status: Secondary | ICD-10-CM

## 2020-03-10 HISTORY — PX: HEMOSTASIS CLIP PLACEMENT: SHX6857

## 2020-03-10 HISTORY — PX: ESOPHAGOGASTRODUODENOSCOPY (EGD) WITH PROPOFOL: SHX5813

## 2020-03-10 HISTORY — PX: HEMOSTASIS CONTROL: SHX6838

## 2020-03-10 LAB — TYPE AND SCREEN
ABO/RH(D): O NEG
Antibody Screen: NEGATIVE

## 2020-03-10 LAB — CBC
HCT: 36.4 % (ref 36.0–46.0)
Hemoglobin: 11.6 g/dL — ABNORMAL LOW (ref 12.0–15.0)
MCH: 31.4 pg (ref 26.0–34.0)
MCHC: 31.9 g/dL (ref 30.0–36.0)
MCV: 98.4 fL (ref 80.0–100.0)
Platelets: 331 10*3/uL (ref 150–400)
RBC: 3.7 MIL/uL — ABNORMAL LOW (ref 3.87–5.11)
RDW: 14.4 % (ref 11.5–15.5)
WBC: 19.9 10*3/uL — ABNORMAL HIGH (ref 4.0–10.5)
nRBC: 0 % (ref 0.0–0.2)

## 2020-03-10 LAB — PROTIME-INR
INR: 1 (ref 0.8–1.2)
Prothrombin Time: 12.7 seconds (ref 11.4–15.2)

## 2020-03-10 LAB — URINALYSIS, ROUTINE W REFLEX MICROSCOPIC
Bacteria, UA: NONE SEEN
Bilirubin Urine: NEGATIVE
Glucose, UA: NEGATIVE mg/dL
Ketones, ur: NEGATIVE mg/dL
Nitrite: NEGATIVE
Protein, ur: NEGATIVE mg/dL
Specific Gravity, Urine: 1.009 (ref 1.005–1.030)
pH: 5 (ref 5.0–8.0)

## 2020-03-10 LAB — BASIC METABOLIC PANEL
Anion gap: 11 (ref 5–15)
BUN: 57 mg/dL — ABNORMAL HIGH (ref 8–23)
CO2: 24 mmol/L (ref 22–32)
Calcium: 9.6 mg/dL (ref 8.9–10.3)
Chloride: 107 mmol/L (ref 98–111)
Creatinine, Ser: 0.93 mg/dL (ref 0.44–1.00)
GFR calc Af Amer: >60 mL/min (ref >60)
GFR calc non Af Amer: 60 mL/min — ABNORMAL LOW (ref >60)
Glucose, Bld: 137 mg/dL — ABNORMAL HIGH (ref 70–99)
Potassium: 4 mmol/L (ref 3.5–5.1)
Sodium: 142 mmol/L (ref 135–145)

## 2020-03-10 LAB — RESPIRATORY PANEL BY RT PCR (FLU A&B, COVID)
Influenza A by PCR: NEGATIVE
Influenza B by PCR: NEGATIVE
SARS Coronavirus 2 by RT PCR: NEGATIVE

## 2020-03-10 LAB — HEMOGLOBIN AND HEMATOCRIT, BLOOD
HCT: 35.3 % — ABNORMAL LOW (ref 36.0–46.0)
HCT: 33.8 % — ABNORMAL LOW (ref 36.0–46.0)
Hemoglobin: 11.2 g/dL — ABNORMAL LOW (ref 12.0–15.0)
Hemoglobin: 10.7 g/dL — ABNORMAL LOW (ref 12.0–15.0)

## 2020-03-10 LAB — POC OCCULT BLOOD, ED: Fecal Occult Bld: POSITIVE — AB

## 2020-03-10 LAB — HEPATIC FUNCTION PANEL
ALT: 27 U/L (ref 0–44)
AST: 24 U/L (ref 15–41)
Albumin: 3.6 g/dL (ref 3.5–5.0)
Alkaline Phosphatase: 51 U/L (ref 38–126)
Bilirubin, Direct: <0.1 mg/dL (ref 0.0–0.2)
Total Bilirubin: 0.9 mg/dL (ref 0.3–1.2)
Total Protein: 6.1 g/dL — ABNORMAL LOW (ref 6.5–8.1)

## 2020-03-10 LAB — MRSA PCR SCREENING: MRSA by PCR: NEGATIVE

## 2020-03-10 IMAGING — DX DG CHEST 1V PORT
1 series · 1 of 1 positions shown · non-contrast
Comparison: [DATE]

CLINICAL DATA: Generalized weakness since yesterday.

EXAM:
PORTABLE CHEST 1 VIEW

[chest]
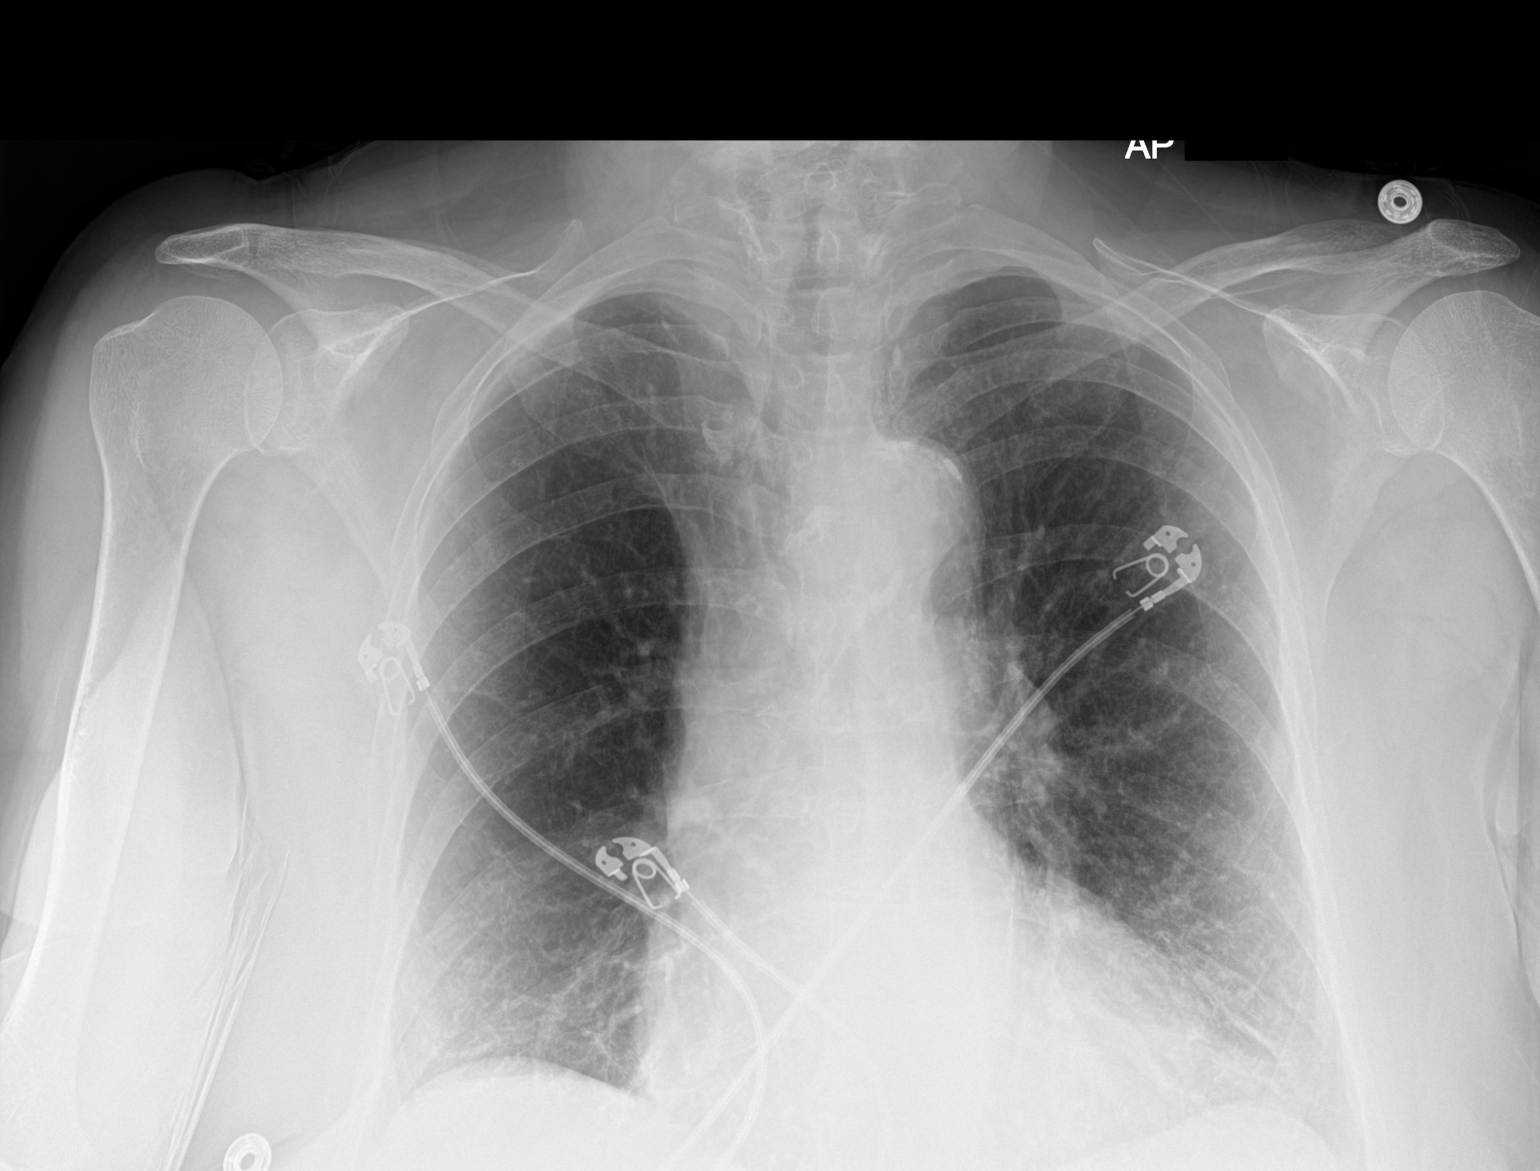

[1 of 1 positions shown; findings below may reference images not displayed]

FINDINGS: Lungs are adequately inflated demonstrate no focal airspace
consolidation or effusion. Cardiomediastinal silhouette and
remainder of the exam is unchanged.
IMPRESSION: No active disease.

## 2020-03-10 SURGERY — ESOPHAGOGASTRODUODENOSCOPY (EGD) WITH PROPOFOL
Anesthesia: Monitor Anesthesia Care

## 2020-03-10 MED ORDER — ACETAMINOPHEN 650 MG RE SUPP: 650.0000 mg | Freq: Four times a day (QID) | RECTAL | Status: DC | PRN

## 2020-03-10 MED ORDER — ONDANSETRON HCL 4 MG PO TABS: 4.0000 mg | ORAL_TABLET | Freq: Four times a day (QID) | ORAL | Status: DC | PRN

## 2020-03-10 MED ORDER — ALBUTEROL SULFATE (2.5 MG/3ML) 0.083% IN NEBU: 2.5000 mg | INHALATION_SOLUTION | Freq: Four times a day (QID) | RESPIRATORY_TRACT | Status: DC | PRN

## 2020-03-10 MED ORDER — PROPOFOL 10 MG/ML IV BOLUS
INTRAVENOUS | Status: DC | PRN
  Administered 2020-03-10: 20 mg via INTRAVENOUS
  Administered 2020-03-10 (×2): 30 mg via INTRAVENOUS

## 2020-03-10 MED ORDER — ATORVASTATIN CALCIUM 80 MG PO TABS
80.0000 mg | ORAL_TABLET | Freq: Every day | ORAL | Status: DC
  Administered 2020-03-10 – 2020-03-11 (×2): 80 mg via ORAL
  Filled 2020-03-10 (×2): qty 1

## 2020-03-10 MED ORDER — ESCITALOPRAM OXALATE 10 MG PO TABS
5.0000 mg | ORAL_TABLET | Freq: Every day | ORAL | Status: DC
  Administered 2020-03-11 – 2020-03-12 (×2): 5 mg via ORAL
  Filled 2020-03-10 (×2): qty 1
  Filled 2020-03-10: qty 0.5
  Filled 2020-03-10: qty 1

## 2020-03-10 MED ORDER — MOMETASONE FURO-FORMOTEROL FUM 200-5 MCG/ACT IN AERO
2.0000 | INHALATION_SPRAY | Freq: Two times a day (BID) | RESPIRATORY_TRACT | Status: DC
  Administered 2020-03-10 – 2020-03-12 (×3): 2 via RESPIRATORY_TRACT
  Filled 2020-03-10: qty 8.8

## 2020-03-10 MED ORDER — LIDOCAINE HCL (CARDIAC) PF 100 MG/5ML IV SOSY
PREFILLED_SYRINGE | INTRAVENOUS | Status: DC | PRN
  Administered 2020-03-10: 80 mg via INTRATRACHEAL

## 2020-03-10 MED ORDER — ONDANSETRON HCL 4 MG/2ML IJ SOLN
4.0000 mg | Freq: Once | INTRAMUSCULAR | Status: AC
  Administered 2020-03-10: 4 mg via INTRAVENOUS
  Filled 2020-03-10: qty 2

## 2020-03-10 MED ORDER — FENTANYL CITRATE (PF) 100 MCG/2ML IJ SOLN
25.0000 ug | Freq: Once | INTRAMUSCULAR | Status: AC
  Administered 2020-03-10: 25 ug via INTRAVENOUS

## 2020-03-10 MED ORDER — SODIUM CHLORIDE 0.9% FLUSH
3.0000 mL | Freq: Two times a day (BID) | INTRAVENOUS | Status: DC
  Administered 2020-03-10 – 2020-03-12 (×5): 3 mL via INTRAVENOUS

## 2020-03-10 MED ORDER — PHENYLEPHRINE HCL (PRESSORS) 10 MG/ML IV SOLN
INTRAVENOUS | Status: DC | PRN
  Administered 2020-03-10: 80 ug via INTRAVENOUS
  Administered 2020-03-10: 160 ug via INTRAVENOUS
  Administered 2020-03-10: 40 ug via INTRAVENOUS
  Administered 2020-03-10 (×2): 120 ug via INTRAVENOUS

## 2020-03-10 MED ORDER — FENTANYL CITRATE (PF) 100 MCG/2ML IJ SOLN
INTRAMUSCULAR | Status: AC
  Filled 2020-03-10: qty 2

## 2020-03-10 MED ORDER — EPHEDRINE SULFATE 50 MG/ML IJ SOLN
INTRAMUSCULAR | Status: DC | PRN
  Administered 2020-03-10 (×2): 10 mg via INTRAVENOUS

## 2020-03-10 MED ORDER — SODIUM CHLORIDE 0.9 % IV SOLN: INTRAVENOUS | Status: DC

## 2020-03-10 MED ORDER — SODIUM CHLORIDE 0.9 % IV SOLN
80.0000 mg | Freq: Once | INTRAVENOUS | Status: AC
  Administered 2020-03-10: 80 mg via INTRAVENOUS
  Filled 2020-03-10: qty 80

## 2020-03-10 MED ORDER — SODIUM CHLORIDE (PF) 0.9 % IJ SOLN
PREFILLED_SYRINGE | INTRAMUSCULAR | Status: DC | PRN
  Administered 2020-03-10: 15:00:00 2 mL

## 2020-03-10 MED ORDER — ONDANSETRON HCL 4 MG/2ML IJ SOLN
4.0000 mg | Freq: Four times a day (QID) | INTRAMUSCULAR | Status: DC | PRN
  Administered 2020-03-11: 4 mg via INTRAVENOUS
  Filled 2020-03-10: qty 2

## 2020-03-10 MED ORDER — ACETAMINOPHEN 325 MG PO TABS
650.0000 mg | ORAL_TABLET | Freq: Four times a day (QID) | ORAL | Status: DC | PRN
  Administered 2020-03-10 – 2020-03-12 (×2): 650 mg via ORAL
  Filled 2020-03-10 (×3): qty 2

## 2020-03-10 MED ORDER — PROPOFOL 500 MG/50ML IV EMUL
INTRAVENOUS | Status: DC | PRN
  Administered 2020-03-10: 100 ug/kg/min via INTRAVENOUS

## 2020-03-10 MED ORDER — SODIUM CHLORIDE 0.9 % IV SOLN: INTRAVENOUS | Status: DC | PRN

## 2020-03-10 MED ORDER — SODIUM CHLORIDE 0.9 % IV SOLN
8.0000 mg/h | INTRAVENOUS | Status: DC
  Administered 2020-03-10 – 2020-03-11 (×4): 8 mg/h via INTRAVENOUS
  Filled 2020-03-10 (×4): qty 80

## 2020-03-10 MED ORDER — SODIUM CHLORIDE 0.9% FLUSH
3.0000 mL | Freq: Once | INTRAVENOUS | Status: AC
  Administered 2020-03-10: 3 mL via INTRAVENOUS

## 2020-03-10 MED ORDER — EPINEPHRINE 1 MG/10ML IJ SOSY
PREFILLED_SYRINGE | INTRAMUSCULAR | Status: AC
  Filled 2020-03-10: qty 10

## 2020-03-10 MED ORDER — ONDANSETRON HCL 4 MG/2ML IJ SOLN
INTRAMUSCULAR | Status: DC | PRN
  Administered 2020-03-10: 4 mg via INTRAVENOUS

## 2020-03-10 MED ORDER — SODIUM CHLORIDE 0.9 % IV BOLUS
500.0000 mL | Freq: Once | INTRAVENOUS | Status: AC
  Administered 2020-03-10: 500 mL via INTRAVENOUS

## 2020-03-10 SURGICAL SUPPLY — 15 items

## 2020-03-10 NOTE — H&P (Addendum)
History and Physical    Samantha King [id number] DOB: 09-Jun-1946 DOA: 03/10/2020  Referring MD/NP/PA: Kennith Maes, PA PCP: Haywood Pao, MD  Patient coming from: Home  Chief Complaint: Nausea and weakness  I have personally briefly reviewed patient's old medical records in [place]   HPI: Samantha King is a 74 y.o. female with medical history significant of HTN, HLD, CAD s/p DES, V. fib arrest, PUD, and remote tobacco abuse who presents with complaints of weakness and nausea which started yesterday afternoon.  She was grocery shopping at the time when symptoms started and she was diaphoretic and globally weak temporarily before symptoms self resolved.  She reports that her stools had changed colors over the last 2-3 days, but thought symptoms were secondary to eating whole box of Oreo cookies.  Since that time she has had several episodes of feeling globally weak.  Patient had previously been on dual antiplatelet therapy, but notes Dr. Stanford Breed had taken her off of the aspirin earlier this year and recommended her to continue on just Plavix.  She is not on any other blood thinners and does not use any NSAIDs.  She has a history of peptic ulcer disease, but notes that her last colonoscopy or EGD was likely in 1998.  She has been on an acids since that time currently on Protonix which she takes daily.  Denies having any vomiting, abdominal pain, fever, shortness of breath, or chest pain.  Since being here in the emergency department she has had at least 2 bowel movements and felt as though she may pass out but never lost consciousness.  ED Course: On admission into the emergency department patient was noted to be afebrile with positive orthostatic vital signs.  Labs significant for WBC 19.9, hemoglobin 11.6, BUN 57, and creatinine 0.93.  Stool guaiacs were noted to be positive.  Patient was given 500 mL IV fluid bolus, Zofran, and started on a Protonix drip.  [facility] formally consulted.  Review of Systems  Constitutional: Positive for malaise/fatigue. Negative for fever.  HENT: Negative for congestion and nosebleeds.   Eyes: Negative for photophobia and pain.  Respiratory: Negative for cough and shortness of breath.   Cardiovascular: Negative for chest pain and leg swelling.  Gastrointestinal: Positive for melena and nausea. Negative for abdominal pain and vomiting.  Genitourinary: Negative for dysuria and frequency.  Musculoskeletal: Negative for joint pain and myalgias.  Skin: Negative for itching.  Neurological: Positive for dizziness and weakness. Negative for focal weakness and loss of consciousness.  Psychiatric/Behavioral: Negative for memory loss and substance abuse.    Past Medical History:  Diagnosis Date  . CAD (coronary artery disease)    a. s/p INF-LAT STEMI => s/p DES-RCA c/b VF arrest and cardiogenic shock  . Cardiac arrest ([place]) 10/2012   STEMI with VF arrest x 2   . GERD (gastroesophageal reflux disease)   . H1N1 influenza 10/2012  . Hx of echocardiogram    a. Echo 10/30/12: Moderate LVH, EF 55-60%, normal wall motion, PASP 45  . Hyperlipidemia   . Hypertension   . Pneumonia 10/2012   a. STEMI c/b LLL pneumonia in setting of recent H1N1 influenza  . PUD (peptic ulcer disease)   . Syncope 1996   reported episode while driving in [place] in 1996 with full cardiac workup = negative  . Tobacco abuse     Past Surgical History:  Procedure Laterality Date  . BREAST EXCISIONAL BIOPSY     left  breast ? 1981  . CORONARY ANGIOPLASTY WITH STENT PLACEMENT     s/p inferior STEMI c/b VF arrest and cardiogenic shock requiring IABP  . INTRA-AORTIC BALLOON PUMP INSERTION  10/29/2012   Procedure: INTRA-AORTIC BALLOON PUMP INSERTION;  Surgeon: Burnell Blanks, MD;  Location: [facility] C[facility] LAB;  Service: Cardiovascular;;  . LEFT HEART CATHETERIZATION WITH CORONARY ANGIOGRAM N/A 10/29/2012   Procedure: LEFT HEART CATHETERIZATION WITH  CORONARY ANGIOGRAM;  Surgeon: Burnell Blanks, MD;  Location: [facility] CATH LAB;  Service: Cardiovascular;  Laterality: N/A;  . PERCUTANEOUS CORONARY STENT INTERVENTION (PCI-S)  10/29/2012   Procedure: PERCUTANEOUS CORONARY STENT INTERVENTION (PCI-S);  Surgeon: Burnell Blanks, MD;  Location: [facility] CATH LAB;  Service: Cardiovascular;;  . TUBAL LIGATION       reports that she quit smoking about 7 years ago. Her smoking use included cigarettes. She has a 20.00 pack-year smoking history. She quit smokeless tobacco use about 8 years ago. She reports that she does not drink alcohol or use drugs.  Allergies  Allergen Reactions  . Shellfish Allergy Nausea And Vomiting  . Codeine Other (See Comments)    Makes me hyperactive and not able to sleep    Family History  Problem Relation Age of Onset  . Heart attack Brother   . Stroke Maternal Grandmother     Prior to Admission medications   Medication Sig Start Date End Date Taking? Authorizing Provider  acetaminophen (TYLENOL) 325 MG tablet Take 650 mg by mouth every 6 (six) hours as needed. For headache or pain    [provider]  atorvastatin (LIPITOR) 80 MG tablet Take 1 tablet (80 mg total) by mouth daily at 6 PM. 11/15/12   Love, Ivan Anchors, PA-C  clopidogrel (PLAVIX) 75 MG tablet Take 1 tablet by mouth once daily 01/26/20   Burnell Blanks, MD  escitalopram (LEXAPRO) 5 MG tablet Take 5 mg by mouth daily.    [provider]  furosemide (LASIX) 40 MG tablet Take 1 tablet (40 mg total) by mouth daily. 11/15/12   Love, Ivan Anchors, PA-C  Krill Oil 500 MG CAPS Take 1 capsule by mouth daily.    [provider]  lisinopril (PRINIVIL,ZESTRIL) 5 MG tablet Take 1 tablet (5 mg total) by mouth daily. 11/15/12   Love, Ivan Anchors, PA-C  metoprolol tartrate (LOPRESSOR) 25 MG tablet Take 1 tablet (25 mg total) by mouth 2 (two) times daily. 11/15/12   Love, Ivan Anchors, PA-C  nitroGLYCERIN (NITROSTAT) 0.4 MG SL tablet DISSOLVE ONE TABLET  UNDER THE TONGUE EVERY FIVE MINUTES AS NEEDED FOR CHEST PAIN. DO NOT EXCEED A TOTAL OF THREE DOSES IN 15 MINUTES 07/12/19   Burnell Blanks, MD  pantoprazole (PROTONIX) 40 MG tablet Take 1 tablet by mouth once daily 01/26/20   Burnell Blanks, MD  Probiotic Product (PROBIOTIC PO) Take 1 capsule by mouth daily.    [provider]  psyllium (REGULOID) 0.52 G capsule Take 0.52 g by mouth daily.    [provider]    Physical Exam:  Constitutional: Elderly female who appears to be in some discomfort Vitals:   03/10/20 0857 03/10/20 0938 03/10/20 1000 03/10/20 1130  BP: 124/79 (!) 102/58 133/75 110/65  Pulse: 92 (!) 56 84 76  Resp: 16 14 20 20   Temp: 98.7 F (37.1 C)     TempSrc: Oral     SpO2: 97%  97% 97%  Weight: 79.4 kg     Height: 5\' 2"  (1.575 m)  Eyes: PERRL, lids and conjunctivae normal ENMT: Mucous membranes are dry. Posterior pharynx clear of any exudate or lesions.  Neck: normal, supple, no masses, no thyromegaly Respiratory: clear to auscultation bilaterally, no wheezing, no crackles. Normal respiratory effort. No accessory muscle use.  Cardiovascular: Regular rate and rhythm, no murmurs / rubs / gallops. No extremity edema. 2+ pedal pulses. No carotid bruits.  Abdomen: no tenderness, no masses palpated. No hepatosplenomegaly. Bowel sounds positive.  Musculoskeletal: no clubbing / cyanosis. No joint deformity upper and lower extremities. Good ROM, no contractures. Normal muscle tone.  Skin: no rashes, lesions, ulcers. No induration Neurologic: CN 2-12 grossly intact. Sensation intact, DTR normal. Strength 5/5 in all 4.  Psychiatric: Normal judgment and insight. Alert and oriented x 3. Normal mood.     Labs on Admission: I have personally reviewed following labs and imaging studies  CBC: Recent Labs  Lab 03/10/20 0912  WBC 19.9*  HGB 11.6*  HCT 36.4  MCV 98.4  PLT [id number]   Basic Metabolic Panel: Recent Labs  Lab 03/10/20 0912  NA  142  K 4.0  CL 107  CO2 24  GLUCOSE 137*  BUN 57*  CREATININE 0.93  CALCIUM 9.6   GFR: Estimated Creatinine Clearance: 50.2 mL/min (by C-G formula based on SCr of 0.93 mg/dL). Liver Function Tests: No results for input(s): AST, ALT, ALKPHOS, BILITOT, PROT, ALBUMIN in the last 168 hours. No results for input(s): LIPASE, AMYLASE in the last 168 hours. No results for input(s): AMMONIA in the last 168 hours. Coagulation Profile: No results for input(s): INR, PROTIME in the last 168 hours. Cardiac Enzymes: No results for input(s): CKTOTAL, CKMB, CKMBINDEX, TROPONINI in the last 168 hours. BNP (last 3 results) No results for input(s): PROBNP in the last 8760 hours. HbA1C: No results for input(s): HGBA1C in the last 72 hours. CBG: No results for input(s): GLUCAP in the last 168 hours. Lipid Profile: No results for input(s): CHOL, HDL, LDLCALC, TRIG, CHOLHDL, LDLDIRECT in the last 72 hours. Thyroid Function Tests: No results for input(s): TSH, T4TOTAL, FREET4, T3FREE, THYROIDAB in the last 72 hours. Anemia Panel: No results for input(s): VITAMINB12, FOLATE, FERRITIN, TIBC, IRON, RETICCTPCT in the last 72 hours. Urine analysis:    Component Value Date/Time   COLORURINE YELLOW 06/24/2017 2158   APPEARANCEUR CLEAR 06/24/2017 2158   LABSPEC 1.013 06/24/2017 2158   PHURINE 6.0 06/24/2017 2158   GLUCOSEU NEGATIVE 06/24/2017 2158   HGBUR NEGATIVE 06/24/2017 2158   BILIRUBINUR NEGATIVE 06/24/2017 2158   [place] NEGATIVE 06/24/2017 2158   PROTEINUR NEGATIVE 06/24/2017 2158   NITRITE NEGATIVE 06/24/2017 2158   LEUKOCYTESUR TRACE (A) 06/24/2017 2158   Sepsis Labs: No results found for this or any previous visit (from the past 240 hour(s)).   Radiological Exams on Admission: No results found.  EKG: Independently reviewed.  Normal sinus rhythm at 100 bpm  Assessment/Plan Upper GI bleed, acute blood loss anemia: Acute.  Patient presents with complaints of weakness and nausea.  Found  to have hemoglobin 11.6, previous available baseline around 13 in 2018.  Labs significant for elevated BUN to suggest upper GI bleed.  Patient with history of PUD and currently on Plavix.  Denies any NSAID use.  Patient was started on a Protonix drip.  [facility] consulted. -Admit to a progressive bed -N.p.o. until evaluated by GI -Continue Protonix drip  -Serial H&H monitoring  -Transfuse blood products as needed   -Hold Plavix -Normal saline IV fluids at 75 mL/h -Appreciate [facility] consultative services  Nausea: Acute.  Patient denies having any episodes of vomiting. -Antiemetics as needed  Essential hypertension: Patient was initially noted to be orthostatic.[name]  Home blood pressure medications include lisinopril 5 mg daily, metoprolol 25 mg twice daily, and Lasix 40 mg daily. -Restart home blood pressure medications when medically appropriate  Coronary artery disease: Patient on Plavix at home.  Prior history of MI requiring stent placement in 2013. -Hold Plavix due to GI bleed   Hyperlipidemia -Continue atorvastatin  History of PUD/GERD: Patient had been on Protonix 40 mg daily -Patient on Protonix as seen above  DVT prophylaxis: SCDs Code Status: Full Family Communication: Plan of care discussed with the patient and her husband who is present at bedside Disposition Plan: Likely discharge home in 2 to 3 days Consults called: GI Admission status: inpatient   Norval Morton MD [facility] Pager [phone]   If 7PM-7AM, please contact night-coverage [email or web address] Password [email or web address]  03/10/2020, 11:45 AM

## 2020-03-10 NOTE — ED Notes (Signed)
Report attempted, 5W unable to take at this time 

## 2020-03-10 NOTE — Consult Note (Addendum)
Referring Provider:  EDP, Dr. Jeanell Sparrow Primary Care Physician:  Osborne Casco, Fransico Him, MD Primary Gastroenterologist:  Althia Forts  Reason for Consultation:  GI bleed  HPI: Samantha King is a 74 y.o. female with past medical history of coronary artery disease with STEMI and V. fib arrest in 2013 maintained on aspirin and Plavix at home, history of GERD and peptic ulcer disease years ago diagnosed by endoscopy in [place] and treated with PPI therapy, hypertension, hyperlipidemia, previous tobacco abuse.  She presented to [facility] emergency department today with complaints of nausea, diaphoresis, weakness.  She says that her stools had been black recently, but she had been eating Oreos and they always tend to be black when she eats Oreos.  Yesterday she was grocery shopping at [employer] when she suddenly became diaphoretic, nauseous, and generally weak feeling.  She stopped her grocery shopping and went home.  Then again last night she was going to go to dinner with her husband and began feeling terribly again.  No chest pain or shortness of breath.  In the ED she had 2 episodes of large-volume dark stool that had been Hemoccult positive.  Became hypotensive and somewhat vasovagal.  Denies any abdominal pain.    Reports history of peptic ulcer disease seen by endoscopy years ago in [place] that was treated with PPI therapy.  She continues on pantoprazole as an outpatient at home.  She denies any issues with bleeding associated with those ulcers at that time, however.  Last colonoscopy several years ago, probably over 10 years ago.  Once again she does take aspirin and Plavix at home.  Here hemoglobin is 11.6 g.  Of note her white blood cell count is 19.9 and it appears that it has been high dating back at least the past 7 years.  BUN is elevated at 57.  She has been started on PPI drip.   Past Medical History:  Diagnosis Date  . CAD (coronary artery disease)    a. s/p INF-LAT STEMI => s/p  DES-RCA c/b VF arrest and cardiogenic shock  . Cardiac arrest ([place]) 10/2012   STEMI with VF arrest x 2   . GERD (gastroesophageal reflux disease)   . H1N1 influenza 10/2012  . Hx of echocardiogram    a. Echo 10/30/12: Moderate LVH, EF 55-60%, normal wall motion, PASP 45  . Hyperlipidemia   . Hypertension   . Pneumonia 10/2012   a. STEMI c/b LLL pneumonia in setting of recent H1N1 influenza  . PUD (peptic ulcer disease)   . Syncope 1996   reported episode while driving in [place] in 1996 with full cardiac workup = negative  . Tobacco abuse     Past Surgical History:  Procedure Laterality Date  . BREAST EXCISIONAL BIOPSY     left breast ? 1981  . CORONARY ANGIOPLASTY WITH STENT PLACEMENT     s/p inferior STEMI c/b VF arrest and cardiogenic shock requiring IABP  . INTRA-AORTIC BALLOON PUMP INSERTION  10/29/2012   Procedure: INTRA-AORTIC BALLOON PUMP INSERTION;  Surgeon: Burnell Blanks, MD;  Location: [facility] CATH LAB;  Service: Cardiovascular;;  . LEFT HEART CATHETERIZATION WITH CORONARY ANGIOGRAM N/A 10/29/2012   Procedure: LEFT HEART CATHETERIZATION WITH CORONARY ANGIOGRAM;  Surgeon: Burnell Blanks, MD;  Location: [facility] CATH LAB;  Service: Cardiovascular;  Laterality: N/A;  . PERCUTANEOUS CORONARY STENT INTERVENTION (PCI-S)  10/29/2012   Procedure: PERCUTANEOUS CORONARY STENT INTERVENTION (PCI-S);  Surgeon: Burnell Blanks, MD;  Location: [facility] CATH LAB;  Service: Cardiovascular;;  .  TUBAL LIGATION      Prior to Admission medications   Medication Sig Start Date End Date Taking? Authorizing Provider  acetaminophen (TYLENOL) 325 MG tablet Take 650 mg by mouth every 6 (six) hours as needed. For headache or pain    [provider]  atorvastatin (LIPITOR) 80 MG tablet Take 1 tablet (80 mg total) by mouth daily at 6 PM. 11/15/12   Love, Ivan Anchors, PA-C  clopidogrel (PLAVIX) 75 MG tablet Take 1 tablet by mouth once daily 01/26/20   Burnell Blanks, MD    escitalopram (LEXAPRO) 5 MG tablet Take 5 mg by mouth daily.    [provider]  furosemide (LASIX) 40 MG tablet Take 1 tablet (40 mg total) by mouth daily. 11/15/12   Love, Ivan Anchors, PA-C  Krill Oil 500 MG CAPS Take 1 capsule by mouth daily.    [provider]  lisinopril (PRINIVIL,ZESTRIL) 5 MG tablet Take 1 tablet (5 mg total) by mouth daily. 11/15/12   Love, Ivan Anchors, PA-C  metoprolol tartrate (LOPRESSOR) 25 MG tablet Take 1 tablet (25 mg total) by mouth 2 (two) times daily. 11/15/12   Love, Ivan Anchors, PA-C  nitroGLYCERIN (NITROSTAT) 0.4 MG SL tablet DISSOLVE ONE TABLET UNDER THE TONGUE EVERY FIVE MINUTES AS NEEDED FOR CHEST PAIN. DO NOT EXCEED A TOTAL OF THREE DOSES IN 15 MINUTES 07/12/19   Burnell Blanks, MD  pantoprazole (PROTONIX) 40 MG tablet Take 1 tablet by mouth once daily 01/26/20   Burnell Blanks, MD  Probiotic Product (PROBIOTIC PO) Take 1 capsule by mouth daily.    [provider]  psyllium (REGULOID) 0.52 G capsule Take 0.52 g by mouth daily.    [provider]    Current Facility-Administered Medications  Medication Dose Route Frequency Provider Last Rate Last Admin  . pantoprazole (PROTONIX) 80 mg in sodium chloride 0.9 % 100 mL (0.8 mg/mL) infusion  8 mg/hr Intravenous Continuous Petrucelli, Samantha R, PA-C      . pantoprazole (PROTONIX) 80 mg in sodium chloride 0.9 % 100 mL IVPB  80 mg Intravenous Once Petrucelli, Samantha R, PA-C      . sodium chloride flush (NS) 0.9 % injection 3 mL  3 mL Intravenous Once Pattricia Boss, MD       Current Outpatient Medications  Medication Sig Dispense Refill  . acetaminophen (TYLENOL) 325 MG tablet Take 650 mg by mouth every 6 (six) hours as needed. For headache or pain    . atorvastatin (LIPITOR) 80 MG tablet Take 1 tablet (80 mg total) by mouth daily at 6 PM. 30 tablet 1  . clopidogrel (PLAVIX) 75 MG tablet Take 1 tablet by mouth once daily 90 tablet 3  . escitalopram (LEXAPRO) 5 MG tablet  Take 5 mg by mouth daily.    . furosemide (LASIX) 40 MG tablet Take 1 tablet (40 mg total) by mouth daily. 30 tablet 1  . Krill Oil 500 MG CAPS Take 1 capsule by mouth daily.    [name] lisinopril (PRINIVIL,ZESTRIL) 5 MG tablet Take 1 tablet (5 mg total) by mouth daily. 30 tablet 1  . metoprolol tartrate (LOPRESSOR) 25 MG tablet Take 1 tablet (25 mg total) by mouth 2 (two) times daily. 60 tablet 1  . nitroGLYCERIN (NITROSTAT) 0.4 MG SL tablet DISSOLVE ONE TABLET UNDER THE TONGUE EVERY FIVE MINUTES AS NEEDED FOR CHEST PAIN. DO NOT EXCEED A TOTAL OF THREE DOSES IN 15 MINUTES 25 tablet 3  . pantoprazole (PROTONIX) 40 MG tablet Take  1 tablet by mouth once daily 90 tablet 3  . Probiotic Product (PROBIOTIC PO) Take 1 capsule by mouth daily.    . psyllium (REGULOID) 0.52 G capsule Take 0.52 g by mouth daily.      Allergies as of 03/10/2020 - Review Complete 03/10/2020  Allergen Reaction Noted  . Shellfish allergy Nausea And Vomiting 01/31/2016  . Codeine Other (See Comments) 11/30/2012    Family History  Problem Relation Age of Onset  . Heart attack Brother   . Stroke Maternal Grandmother     Social History   Socioeconomic History  . Marital status: Married    Spouse name: Not on file  . Number of children: Not on file  . Years of education: Not on file  . Highest education level: Not on file  Occupational History  . Not on file  Tobacco Use  . Smoking status: Former Smoker    Packs/day: 1.00    Years: 20.00    Pack years: 20.00    Types: Cigarettes    Quit date: 11/05/2012    Years since quitting: 7.3  . Smokeless tobacco: Former Systems developer    Quit date: 11/30/2011  Substance and Sexual Activity  . Alcohol use: No  . Drug use: No  . Sexual activity: Not on file  Other Topics Concern  . Not on file  Social History Narrative  . Not on file   Social Determinants of Health   Financial Resource Strain:   . Difficulty of Paying Living Expenses:   Food Insecurity:   . Worried About  Charity fundraiser in the Last Year:   . Arboriculturist in the Last Year:   Transportation Needs:   . Film/video editor (Medical):   [name] Lack of Transportation (Non-Medical):   Physical Activity:   . Days of Exercise per Week:   . Minutes of Exercise per Session:   Stress:   . Feeling of Stress :   Social Connections:   . Frequency of Communication with Friends and Family:   . Frequency of Social Gatherings with Friends and Family:   . Attends Religious Services:   . Active Member of Clubs or Organizations:   . Attends Archivist Meetings:   [name] Marital Status:   Intimate Partner Violence:   . Fear of Current or Ex-Partner:   . Emotionally Abused:   [name] Physically Abused:   . Sexually Abused:     Review of Systems: ROS is O/W negative except as mentioned in HPI.  Physical Exam: Vital signs in last 24 hours: Temp:  [98.7 F (37.1 C)] 98.7 F (37.1 C) (05/01 0857) Pulse Rate:  [56-92] 84 (05/01 1000) Resp:  [14-20] 20 (05/01 1000) BP: (102-133)/(58-79) 133/75 (05/01 1000) SpO2:  [97 %] 97 % (05/01 1000) Weight:  [79.4 kg] 79.4 kg (05/01 0857)   General:  Alert, Well-developed, well-nourished, pleasant and cooperative in NAD Head:  Normocephalic and atraumatic. Eyes:  Sclera clear, no icterus  Conjunctiva pink. Ears:  Normal auditory acuity. Mouth:  No deformity or lesions.   Lungs:  Clear throughout to auscultation.  No wheezes, crackles, or rhonchi.  Heart:  Regular rate and rhythm; no murmurs, clicks, rubs, or gallops. Abdomen:  Soft, non-distended.  BS present.  Non-tender. Rectal:  Deferred.  Hemoccult positive by EDP.  Msk:  Symmetrical without gross deformities. Pulses:  Normal pulses noted. Extremities:  Without clubbing or edema. Neurologic:  Alert and oriented x 4;  grossly normal neurologically. Skin:  Intact without significant lesions or rashes. Psych:  Alert and cooperative. Normal mood and affect.  Lab Results: Recent Labs     03/10/20 0912  WBC 19.9*  HGB 11.6*  HCT 36.4  PLT 331   BMET Recent Labs    03/10/20 0912  NA 142  K 4.0  CL 107  CO2 24  GLUCOSE 137*  BUN 57*  CREATININE 0.93  CALCIUM 9.6   IMPRESSION:  *Melena/black stools that have been Hemoccult positive here in the ED with associated dizziness, nausea, hypotension, diaphoresis.  Hemoglobin close to normal here this morning at 11.6 g, but likely expect that to drop.  BUN elevated at 57.  Rule out recurrent peptic ulcer disease, dieulafoy lesion, AVM, etc. *History of peptic ulcer disease seen by endoscopy years ago in [place] treated with PPI, but no history of bleeding at that time. *History of coronary artery disease and STEMI with V. fib arrest in 2013 on chronic aspirin and Plavix at home. *Leukocytosis: Appears that this is been a problem dating back at least 7 years, but question source or diagnosis.  PLAN: *Monitor hemoglobin and transfuse as needed. *She has received Protonix bolus and has been started on Protonix drip. *N.p.o. and will proceed with EGD later today.   Laban Emperor. Fraya Ueda  03/10/2020, 11:31 AM

## 2020-03-10 NOTE — ED Notes (Signed)
Pt transported to Endo. Pt will return to ED. Room to be held.

## 2020-03-10 NOTE — Interval H&P Note (Signed)
History and Physical Interval Note:  03/10/2020 2:25 PM  Samantha King  has presented today for surgery, with the diagnosis of Melena, UGIB.  The various methods of treatment have been discussed with the patient and family. After consideration of risks, benefits and other options for treatment, the patient has consented to  Procedure(s): ESOPHAGOGASTRODUODENOSCOPY (EGD) WITH PROPOFOL (N/A) as a surgical intervention.  The patient's history has been reviewed, patient examined, no change in status, stable for surgery.  I have reviewed the patient's chart and labs.  Questions were answered to the patient's satisfaction.     Dominic Pea Sharay Bellissimo

## 2020-03-10 NOTE — Plan of Care (Signed)
  Problem: Education: Goal: Knowledge of General Education information will improve Description: Including pain rating scale, medication(s)/side effects and non-pharmacologic comfort measures Outcome: Progressing   Problem: Health Behavior/Discharge Planning: Goal: Ability to manage health-related needs will improve Outcome: Progressing   Problem: Clinical Measurements: Goal: Ability to maintain clinical measurements within normal limits will improve Outcome: Progressing Goal: Diagnostic test results will improve Outcome: Progressing Goal: Respiratory complications will improve Outcome: Progressing   Problem: Nutrition: Goal: Adequate nutrition will be maintained Outcome: Progressing   Problem: Coping: Goal: Level of anxiety will decrease Outcome: Progressing   Problem: Elimination: Goal: Will not experience complications related to bowel motility Outcome: Progressing Goal: Will not experience complications related to urinary retention Outcome: Progressing
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
## 2020-03-10 NOTE — Op Note (Signed)
[facility] Patient Name: Samantha King Procedure Date : 03/10/2020 MRN: [id number] Attending MD: Gerrit Heck , MD Date of Birth: 07-27-1946 CSN: [id number] Age: 74 Admit Type: Emergency Department Procedure:                Upper GI endoscopy Indications:              Acute post hemorrhagic anemia, Melena Providers:                Gerrit Heck, MD, Carlyn Reichert, RN, Laverda Sorenson, Technician, Haze Boyden, CRNA Referring MD:              Medicines:                Monitored Anesthesia Care Complications:            No immediate complications. Estimated Blood Loss:     Estimated blood loss was minimal. Procedure:                Pre-Anesthesia Assessment:                           - Prior to the procedure, a History and Physical                            was performed, and patient medications and                            allergies were reviewed. The patient's tolerance of                            previous anesthesia was also reviewed. The risks                            and benefits of the procedure and the sedation                            options and risks were discussed with the patient.                            All questions were answered, and informed consent                            was obtained. Prior Anticoagulants: The patient has                            taken Plavix (clopidogrel), last dose was 1 day                            prior to procedure. ASA Grade Assessment: III - A                            patient with severe systemic disease. After  reviewing the risks and benefits, the patient was                            deemed in satisfactory condition to undergo the                            procedure.                           After obtaining informed consent, the endoscope was                            passed under direct vision. Throughout the   procedure, the patient's blood pressure, pulse, and                            oxygen saturations were monitored continuously. The                            [id number] Olympus gastroscope was                            introduced through the mouth, and advanced to the                            second part of duodenum. The upper GI endoscopy was                            accomplished without difficulty. The patient                            tolerated the procedure well. Scope In: Scope Out: Findings:      The examined esophagus was normal.      Scattered mild inflammation characterized by erythema was found in the       gastric body and in the gastric antrum. Biopsies were not obtained due       to dual antiplatelet therapy and not to confound active, recent bleeding.      Red blood was found in the duodenal bulb. There was a visible vessel       with active bleeding in the proximal duodenal bulb, immediately distal       to the pylorus. There was no apparent surrounding ulcer, raising       suspicion for Dieulafoy lesion. Positining was difficult. In total, 4       hemostatic clips were successfully placed (MR conditional). The area was       then successfully injected with 3 mL of a 1:10,000 solution of       epinephrine for hemostasis and complete cessation of bleeding.      One non-bleeding cratered duodenal ulcer with no stigmata of bleeding       was found in the duodenal bulb. The lesion was 5 mm in largest       dimension. This was located away from the active bleeding site above.       Biopsies were not obtained. Impression:               - Normal  esophagus.                           - Gastritis.                           - Visible vessel with active, brisk bleeding in the                            proximal duodenal bulb. Despite the presence of                            ulcer on the contralateral duodenal wall, there was                            no apparent  ulcer surrounding the bleeding vessel,                            raising suspicion for Dieulafoy lesion. This was                            successfully managed with hemostatic clips and                            Epinephrine injection, with complete cessation of                            bleeding.                           - Non-bleeding duodenal ulcer with no stigmata of                            bleeding.                           - No specimens collected. Recommendation:           - Return patient to hospital ward for ongoing care.                           - Clear liquid diet today. If no evidence of                            rebleeding and stable H/H, can advance diet as                            tolerated tomorrow with potential discharge.                           - Continue Protonix 40 mg IV BID today, then                            transition tomorrow to Protonix (pantoprazole) 40  mg PO BID for 6 weeks to promote mucosal healing                            and adequate clot formation, then resume at 40                            mg/day for ongoing gastric prophylaxis and GERD                            management.                           - Review with Cardiology whether continued dual                            antiplatelet therapy is needed. If Plavix is still                            needed, recommend waiting at least 1 week before                            restarting. Can otherwise treat with ASA 81 mg,                            starting again in 2 days.                           - Repeat upper endoscopy in 6-8 weeks to check                            healing. If gastritis and duodenal ulcer still                            present, plan for biopsies at that time to assess                            for H pylori.                           - Please do not hesitate to contact with concerns                            for rebleeding.                            - Continue serial H/H checks Q6H. Procedure Code(s):        --- Professional ---                           [phone], Esophagogastroduodenoscopy, flexible,                            transoral; with control of bleeding, any method Diagnosis Code(s):        --- Professional ---  K29.70, Gastritis, unspecified, without bleeding                           K92.2, Gastrointestinal hemorrhage, unspecified                           K26.9, Duodenal ulcer, unspecified as acute or                            chronic, without hemorrhage or perforation                           D62, Acute posthemorrhagic anemia                           K92.1, Melena (includes Hematochezia) CPT copyright 2019 American Medical Association. All rights reserved. The codes documented in this report are preliminary and upon coder review may  be revised to meet current compliance requirements. Gerrit Heck, MD 03/10/2020 3:19:00 PM Number of Addenda: 0

## 2020-03-10 NOTE — ED Triage Notes (Signed)
C/o nausea and generalized weakness since yesterday.  Also reports stool is dark colored but she believes it may be related to 3 packs of Oreo cookies she ate Wednesday night.  Denies vomiting.  Denies pain.

## 2020-03-10 NOTE — ED Notes (Signed)
purewick placed at this time 

## 2020-03-10 NOTE — ED Provider Notes (Signed)
[facility] EMERGENCY DEPARTMENT Provider Note   CSN: [id number] Arrival date & time: 03/10/20  [id number]     History Chief Complaint  Patient presents with  . Nausea  . Weakness    Samantha King is a 74 y.o. female with a history of CAD, hypertension, hyperlipidemia, anemia, and peptic ulcer disease who presents to the ED with complaints of intermittent nausea with generalized weakness since yesterday. Patient states sxs began while she was grocery shopping, she initially felt fine but quickly developed nausea, diaphoresis, and feeling generally weak, states this lasted a few minutes prior to resolution. Relays she did not feel as though she was going to pass out or have any lightheadedness, dizziness, chest pain, or dyspnea associated with the event. Since then she has had intermittent similar sxs lasting similar duration, estimates about 10 episodes, nothing specific seems to trigger or alleviated sxs. Most recently had a similar episode while on the commode in the ED. ED NT at bedside relays patient appeared very pale, diaphoretic, and was concerned she had a vasovagal episode, large volume dark stool was also noted at that time. Patient states she was aware of what was going on and does not think she fully had a loss of consciousness. She also mentions her stools have been very dark over the past few days, she is unsure if this has to do with eating Oreos. She denies fever, emesis, chest pain, dyspnea, cough, abdominal pain, dysuria, or constipation. No recent medication, diet, or lifestyle changes. She is currently on weightwatchers, but ate plenty of food recently. Patient is on plavix, denies additional blood thinning medicines, denies NSAID use, denies alcohol use.   HPI     Past Medical History:  Diagnosis Date  . CAD (coronary artery disease)    a. s/p INF-LAT STEMI => s/p DES-RCA c/b VF arrest and cardiogenic shock  . Cardiac arrest ([place]) 10/2012   STEMI with  VF arrest x 2   . GERD (gastroesophageal reflux disease)   . H1N1 influenza 10/2012  . Hx of echocardiogram    a. Echo 10/30/12: Moderate LVH, EF 55-60%, normal wall motion, PASP 45  . Hyperlipidemia   . Hypertension   . Pneumonia 10/2012   a. STEMI c/b LLL pneumonia in setting of recent H1N1 influenza  . PUD (peptic ulcer disease)   . Syncope 1996   reported episode while driving in [place] in 1996 with full cardiac workup = negative  . Tobacco abuse     Patient Active Problem List   Diagnosis Date Noted  . Coronary atherosclerosis of native coronary artery 11/10/2012  . Tobacco abuse 11/09/2012  . NSVT (nonsustained ventricular tachycardia) ([place]) 11/09/2012  . Acute paranoia ([place]) 11/09/2012  . Severe muscle deconditioning 11/09/2012  . Hyperlipidemia 11/09/2012  . Hypokalemia 11/05/2012  . Acute blood loss anemia 11/01/2012  . H1N1 influenza with pneumonia 10/31/2012  . Hypertension 10/29/2012  . GERD (gastroesophageal reflux disease) 10/29/2012  . Acute respiratory failure ([place]) 10/29/2012  . Cardiogenic shock ([place]) 10/29/2012  . Cardiac arrest ([place]) 10/29/2012  . ST elevation myocardial infarction (STEMI) of inferior wall ([place]) 10/29/2012    Past Surgical History:  Procedure Laterality Date  . BREAST EXCISIONAL BIOPSY     left breast ? 1981  . CORONARY ANGIOPLASTY WITH STENT PLACEMENT     s/p inferior STEMI c/b VF arrest and cardiogenic shock requiring IABP  . INTRA-AORTIC BALLOON PUMP INSERTION  10/29/2012   Procedure: INTRA-AORTIC BALLOON PUMP INSERTION;  Surgeon: Harrell Gave  Santina Evans, MD;  Location: [facility] CATH LAB;  Service: Cardiovascular;;  . LEFT HEART CATHETERIZATION WITH CORONARY ANGIOGRAM N/A 10/29/2012   Procedure: LEFT HEART CATHETERIZATION WITH CORONARY ANGIOGRAM;  Surgeon: Burnell Blanks, MD;  Location: [facility] CATH LAB;  Service: Cardiovascular;  Laterality: N/A;  . PERCUTANEOUS CORONARY STENT INTERVENTION (PCI-S)  10/29/2012   Procedure: PERCUTANEOUS  CORONARY STENT INTERVENTION (PCI-S);  Surgeon: Burnell Blanks, MD;  Location: [facility] CATH LAB;  Service: Cardiovascular;;  . TUBAL LIGATION       OB History   No obstetric history on file.     Family History  Problem Relation Age of Onset  . Heart attack Brother   . Stroke Maternal Grandmother     Social History   Tobacco Use  . Smoking status: Former Smoker    Packs/day: 1.00    Years: 20.00    Pack years: 20.00    Types: Cigarettes    Quit date: 11/05/2012    Years since quitting: 7.3  . Smokeless tobacco: Former Systems developer    Quit date: 11/30/2011  Substance Use Topics  . Alcohol use: No  . Drug use: No    Home Medications Prior to Admission medications   Medication Sig Start Date End Date Taking? Authorizing Provider  acetaminophen (TYLENOL) 325 MG tablet Take 650 mg by mouth every 6 (six) hours as needed. For headache or pain    [provider]  atorvastatin (LIPITOR) 80 MG tablet Take 1 tablet (80 mg total) by mouth daily at 6 PM. 11/15/12   Love, Ivan Anchors, PA-C  clopidogrel (PLAVIX) 75 MG tablet Take 1 tablet by mouth once daily 01/26/20   Burnell Blanks, MD  escitalopram (LEXAPRO) 5 MG tablet Take 5 mg by mouth daily.    [provider]  furosemide (LASIX) 40 MG tablet Take 1 tablet (40 mg total) by mouth daily. 11/15/12   Love, Ivan Anchors, PA-C  Krill Oil 500 MG CAPS Take 1 capsule by mouth daily.    [provider]  lisinopril (PRINIVIL,ZESTRIL) 5 MG tablet Take 1 tablet (5 mg total) by mouth daily. 11/15/12   Love, Ivan Anchors, PA-C  metoprolol tartrate (LOPRESSOR) 25 MG tablet Take 1 tablet (25 mg total) by mouth 2 (two) times daily. 11/15/12   Love, Ivan Anchors, PA-C  nitroGLYCERIN (NITROSTAT) 0.4 MG SL tablet DISSOLVE ONE TABLET UNDER THE TONGUE EVERY FIVE MINUTES AS NEEDED FOR CHEST PAIN. DO NOT EXCEED A TOTAL OF THREE DOSES IN 15 MINUTES 07/12/19   Burnell Blanks, MD  pantoprazole (PROTONIX) 40 MG tablet Take 1 tablet by mouth once  daily 01/26/20   Burnell Blanks, MD  Probiotic Product (PROBIOTIC PO) Take 1 capsule by mouth daily.    [provider]  psyllium (REGULOID) 0.52 G capsule Take 0.52 g by mouth daily.    [provider]    Allergies    Shellfish allergy and Codeine  Review of Systems   Review of Systems  Constitutional: Positive for diaphoresis. Negative for chills and fever.  Respiratory: Negative for cough and shortness of breath.   Cardiovascular: Negative for chest pain.  Gastrointestinal: Positive for nausea. Negative for abdominal pain, constipation, diarrhea and vomiting.       Positive for dark stool.   Genitourinary: Negative for dysuria.  Neurological: Positive for weakness. Negative for dizziness, syncope and light-headedness.  All other systems reviewed and are negative.   Physical Exam Updated Vital Signs BP (!) 102/58   Pulse (!) 56  Temp 98.7 F (37.1 C) (Oral)   Resp 14   Ht 5\' 2"  (1.575 m)   Wt 79.4 kg   SpO2 97%   BMI 32.03 kg/m   Physical Exam Vitals and nursing note reviewed.  Constitutional:      General: She is not in acute distress.    Appearance: She is well-developed. She is not toxic-appearing.  HENT:     Head: Normocephalic and atraumatic.  Eyes:     General:        Right eye: No discharge.        Left eye: No discharge.     Conjunctiva/sclera: Conjunctivae normal.  Cardiovascular:     Rate and Rhythm: Normal rate and regular rhythm.  Pulmonary:     Effort: Pulmonary effort is normal. No respiratory distress.     Breath sounds: Normal breath sounds. No wheezing, rhonchi or rales.  Abdominal:     General: There is no distension.     Palpations: Abdomen is soft.     Tenderness: There is no abdominal tenderness. There is no guarding or rebound.  Genitourinary:    Comments: Melena stool noted.  Chaperone present. Musculoskeletal:     Cervical back: Neck supple.  Skin:    General: Skin is warm.     Findings: No rash.      Comments: Mild pallor noted.  Mildly diaphoretic.  Neurological:     Mental Status: She is alert.     Comments: Clear speech.  CN III through XII grossly intact.  Sensation grossly intact bilateral upper and lower extremities.  5 out of 5 symmetric grip strength and strength with plantar dorsiflexion bilaterally.  Negative pronator drift.  Normal finger-to-nose.  Psychiatric:        Behavior: Behavior normal.    ED Results / Procedures / Treatments   Labs (all labs ordered are listed, but only abnormal results are displayed) Labs Reviewed  BASIC METABOLIC PANEL - Abnormal; Notable for the following components:      Result Value   Glucose, Bld 137 (*)    BUN 57 (*)    GFR calc non Af Amer 60 (*)    All other components within normal limits  CBC - Abnormal; Notable for the following components:   WBC 19.9 (*)    RBC 3.70 (*)    Hemoglobin 11.6 (*)    All other components within normal limits  POC OCCULT BLOOD, ED - Abnormal; Notable for the following components:   Fecal Occult Bld POSITIVE (*)    All other components within normal limits  URINALYSIS, ROUTINE W REFLEX MICROSCOPIC  HEPATIC FUNCTION PANEL    EKG EKG Interpretation  Date/Time:  Saturday Mar 10 2020 09:09:26 EDT Ventricular Rate:  100 PR Interval:  158 QRS Duration: 82 QT Interval:  378 QTC Calculation: 487 R Axis:   22 Text Interpretation: Normal sinus rhythm Low voltage QRS Possible Inferior infarct , age undetermined Possible Anterolateral infarct , age undetermined Abnormal ECG Confirmed by Pattricia Boss [id number]) on 03/10/2020 11:05:28 AM   Radiology No results found.  Procedures Procedures (including critical care time)  Medications Ordered in ED Medications  sodium chloride flush (NS) 0.9 % injection 3 mL (has no administration in time range)    ED Course  I have reviewed the triage vital signs and the nursing notes.  Pertinent labs & imaging results that were available during my care of the  patient were reviewed by me and considered in my medical decision making (see  chart for details).    JORIE BETZ was evaluated in Emergency Department on 03/10/2020 for the symptoms described in the history of present illness. He/she was evaluated in the context of the global COVID-19 pandemic, which necessitated consideration that the patient might be at risk for infection with the SARS-CoV-2 virus that causes COVID-19. Institutional protocols and algorithms that pertain to the evaluation of patients at risk for COVID-19 are in a state of rapid change based on information released by regulatory bodies including the CDC and federal and state organizations. These policies and algorithms were followed during the patient's care in the ED.  MDM Rules/Calculators/A&P                     Patient presents to the ED with complaints of nausea and generalized weakness.  Also mentions some dark stools.  Nontoxic, initial vitals without significant abnormality.  She appears mildly pale and a bit diaphoretic after her episode in the bathroom with our ED tech.  Her abdomen is nontender without peritoneal signs.  She has no focal neurologic deficits.  DDx: Orthostatic hypotension, vasovagal events, GI bleed, arrhythmia, anemia, electrolyte derangement, viral illness.  Additional history obtained:  Additional history obtained from patient's husband at bedside. Previous records obtained and reviewed.  She had no prior EGD or colonoscopy in our system, she states these were when she was living in [place].  EKG: No STEMI Lab Tests:  I Ordered, reviewed, and interpreted labs, which included:  CBC: Mild decline in hemoglobin to 11.6, most recently 13.2.  Leukocytosis at 19.9, unclear source, does have a history of leukocytosis in the past-no respiratory complaints, abdomen is nontender, no urinary complaints, urinalysis pending. BMP: Elevated BUN, creatinine WNL.  No significant lecture light derangements.  Hepatic function panel: Mildly low protein, otherwise within normal limits. Fecal occult testing: Positive COVID: Pending  ED Course:   Patient with episode of diaphoresis with nausea and concern for near syncope in the ED. She has had 2 large volume melanous bowel movements since arrival today. Orthostatics are positive as below.   Lying flat: 104/70 (81) Sitting: 125/62 (82) Standing: 96/67 (74)  Concern for upper GI bleed with melenous stool & elevated BUN. Hgb/hct mildly low, however continues to have large volume BMs in the ED with positive orthostatics. Start protonix bolus & drip with plan to consult gastroenterology and subsequently hospitalist service for admission.   Findings and plan of care discussed with supervising physician Dr. Jeanell Sparrow who is in agreement.   11:28: CONSULT: Discussed case with Alonza Bogus PA-C with gastroenterology, will see patient in consultation in agreement with Protonix drip.  11:49: CONSULT: Discussed case with hospitalist Dr. Tamala Julian- accepts admission.   Portions of this note were generated with [employer]. Dictation errors may occur despite best attempts at proofreading.  Final Clinical Impression(s) / ED Diagnoses Final diagnoses:  Gastrointestinal hemorrhage, unspecified gastrointestinal hemorrhage type    Rx / DC Orders ED Discharge Orders    None       Amaryllis Dyke, PA-C 03/10/20 1151    Pattricia Boss, MD 03/10/20 1358

## 2020-03-10 NOTE — Anesthesia Procedure Notes (Signed)
Procedure Name: MAC Date/Time: 03/10/2020 2:39 PM Performed by: Kathryne Hitch, CRNA Pre-anesthesia Checklist: Patient identified, Emergency Drugs available, Patient being monitored and Suction available Patient Re-evaluated:Patient Re-evaluated prior to induction Oxygen Delivery Method: Nasal cannula Preoxygenation: Pre-oxygenation with 100% oxygen Induction Type: IV induction Dental Injury: Teeth and Oropharynx as per pre-operative assessment

## 2020-03-10 NOTE — Plan of Care (Signed)

## 2020-03-10 NOTE — Anesthesia Preprocedure Evaluation (Signed)
Anesthesia Evaluation  Patient identified by MRN, date of birth, ID band Patient awake    Reviewed: Allergy & Precautions, NPO status , Patient's Chart, lab work & pertinent test results  Airway Mallampati: II  TM Distance: >3 FB     Dental   Pulmonary pneumonia, former smoker,    breath sounds clear to auscultation       Cardiovascular hypertension, + CAD and + Past MI   Rhythm:Regular Rate:Normal     Neuro/Psych PSYCHIATRIC DISORDERS    GI/Hepatic Neg liver ROS, PUD, GERD  ,  Endo/Other  negative endocrine ROS  Renal/GU negative Renal ROS     Musculoskeletal   Abdominal   Peds  Hematology  (+) anemia ,   Anesthesia Other Findings   Reproductive/Obstetrics                             Anesthesia Physical Anesthesia Plan  ASA: III  Anesthesia Plan: MAC   Post-op Pain Management:    Induction: Intravenous  PONV Risk Score and Plan: 2 and Ondansetron and Dexamethasone  Airway Management Planned: Nasal Cannula and Simple Face Mask  Additional Equipment:   Intra-op Plan:   Post-operative Plan:   Informed Consent: I have reviewed the patients History and Physical, chart, labs and discussed the procedure including the risks, benefits and alternatives for the proposed anesthesia with the patient or authorized representative who has indicated his/her understanding and acceptance.     Dental advisory given  Plan Discussed with: CRNA and Anesthesiologist  Anesthesia Plan Comments:         Anesthesia Quick Evaluation

## 2020-03-10 NOTE — ED Notes (Signed)
Orthostatic vitals    Lying flat: 104/70 (81) Sitting: 125/62 (82) Standing: 96/67 (74)

## 2020-03-10 NOTE — Transfer of Care (Signed)
Immediate Anesthesia Transfer of Care Note  Patient: Samantha King  Procedure(s) Performed: ESOPHAGOGASTRODUODENOSCOPY (EGD) WITH PROPOFOL (N/A ) HEMOSTASIS CLIP PLACEMENT HEMOSTASIS CONTROL  Patient Location: Endoscopy Unit  Anesthesia Type:MAC  Level of Consciousness: drowsy and patient cooperative  Airway & Oxygen Therapy: Patient Spontanous Breathing and Patient connected to nasal cannula oxygen  Post-op Assessment: Report given to RN and Post -op Vital signs reviewed and stable  Post vital signs: Reviewed and stable  Last Vitals:  Vitals Value Taken Time  BP    Temp    Pulse    Resp    SpO2      Last Pain:  Vitals:   03/10/20 1415  TempSrc:   PainSc: 0-No pain         Complications: No apparent anesthesia complications

## 2020-03-11 DIAGNOSIS — K2990 Gastroduodenitis, unspecified, without bleeding: Secondary | ICD-10-CM

## 2020-03-11 LAB — CBC
HCT: 26 % — ABNORMAL LOW (ref 36.0–46.0)
HCT: 27.5 % — ABNORMAL LOW (ref 36.0–46.0)
Hemoglobin: 8.1 g/dL — ABNORMAL LOW (ref 12.0–15.0)
Hemoglobin: 8.8 g/dL — ABNORMAL LOW (ref 12.0–15.0)
MCH: 31.5 pg (ref 26.0–34.0)
MCH: 31.9 pg (ref 26.0–34.0)
MCHC: 31.2 g/dL (ref 30.0–36.0)
MCHC: 32 g/dL (ref 30.0–36.0)
MCV: 101.2 fL — ABNORMAL HIGH (ref 80.0–100.0)
MCV: 99.6 fL (ref 80.0–100.0)
Platelets: 209 10*3/uL (ref 150–400)
Platelets: 244 10*3/uL (ref 150–400)
RBC: 2.57 MIL/uL — ABNORMAL LOW (ref 3.87–5.11)
RBC: 2.76 MIL/uL — ABNORMAL LOW (ref 3.87–5.11)
RDW: 14.6 % (ref 11.5–15.5)
RDW: 14.9 % (ref 11.5–15.5)
WBC: 11.3 10*3/uL — ABNORMAL HIGH (ref 4.0–10.5)
WBC: 12.8 10*3/uL — ABNORMAL HIGH (ref 4.0–10.5)
nRBC: 0 % (ref 0.0–0.2)
nRBC: 0 % (ref 0.0–0.2)

## 2020-03-11 LAB — BASIC METABOLIC PANEL
Anion gap: 12 (ref 5–15)
BUN: 34 mg/dL — ABNORMAL HIGH (ref 8–23)
CO2: 18 mmol/L — ABNORMAL LOW (ref 22–32)
Calcium: 8.5 mg/dL — ABNORMAL LOW (ref 8.9–10.3)
Chloride: 112 mmol/L — ABNORMAL HIGH (ref 98–111)
Creatinine, Ser: 0.95 mg/dL (ref 0.44–1.00)
GFR calc Af Amer: >60 mL/min (ref >60)
GFR calc non Af Amer: 58 mL/min — ABNORMAL LOW (ref >60)
Glucose, Bld: 93 mg/dL (ref 70–99)
Potassium: 4 mmol/L (ref 3.5–5.1)
Sodium: 142 mmol/L (ref 135–145)

## 2020-03-11 MED ORDER — RAMELTEON 8 MG PO TABS
8.0000 mg | ORAL_TABLET | Freq: Once | ORAL | Status: AC
  Administered 2020-03-11: 8 mg via ORAL
  Filled 2020-03-11: qty 1

## 2020-03-11 MED ORDER — PANTOPRAZOLE SODIUM 40 MG PO TBEC
40.0000 mg | DELAYED_RELEASE_TABLET | Freq: Two times a day (BID) | ORAL | Status: DC
  Administered 2020-03-11 – 2020-03-12 (×3): 40 mg via ORAL
  Filled 2020-03-11 (×3): qty 1

## 2020-03-11 NOTE — Progress Notes (Signed)
PROGRESS NOTE        PATIENT DETAILS Name: Samantha King Age: 74 y.o. Sex: female Date of Birth: 03/21/1946 Admit Date: 03/10/2020 Admitting Physician Norval Morton, MD [id number], Fransico Him, MD  Brief Narrative: Patient is a 74 y.o. female with history of HTN, HLD, history of V. fib arrest due to MI-s/p PCI with DES in 2013-presented with weakness, melanotic stools-subsequently admitted to the hospitalist service.  See  below for further details  Significant events: 5/1>> admit to [facility] for upper GI bleeding  Antimicrobial therapy: None  Microbiology data: None  Procedures : 5/1>> EGD: Visible vessel with active brisk bleeding in the proximal duodenal bulb-likely Dieulafoy lesion  Consults: GI  DVT Prophylaxis : SCD's  Subjective: Claims no further BM since yesterday.  Lying comfortably in bed.  Denies any abdominal pain or chest pain.  Assessment/Plan: Upper GI bleeding with blood loss anemia: EGD with high risk finding-see above.  Significant drop in hemoglobin-however patient does not appear to be actively bleeding-no BM since yesterday-could be from equilibration/IV fluids.  Watch closely-repeat CBC later today.  Continue PPI-GI following.    CAD with history of cardiac arrest-s/p PCI in 2013: No anginal symptoms-antiplatelets on hold due to active GI bleeding.  HLD: Continue statin  HTN: BP controlled-all antihypertensives on hold.  Obesity: Estimated body mass index is 32.9 kg/m as calculated from the following:   Height as of this encounter: 5\' 2"  (1.575 m).   Weight as of this encounter: 81.6 kg.   Diet: Diet Order            Diet clear liquid Room service appropriate? Yes; Fluid consistency: Thin  Diet effective now               Code Status: Full code   Family Communication: Spouse over the phone on 5/2  Disposition Plan: Remains inpatient appropriate because:Inpatient level of care appropriate due to  severity of illness   Dispo: The patient is from: Home              Anticipated d/c is to: Home              Anticipated d/c date is: 2 days              Patient currently is not medically stable to d/c.  Barriers to Discharge: GI bleeding with high risk EGD findings-with drop in hemoglobin this morning.  Antimicrobial agents: Anti-infectives (From admission, onward)   None      Time spent: 35 minutes-Greater than 50% of this time was spent in counseling, explanation of diagnosis, planning of further management, and coordination of care.  MEDICATIONS: Scheduled Meds: . atorvastatin  80 mg Oral q1800  . escitalopram  5 mg Oral Daily  . mometasone-formoterol  2 puff Inhalation BID  . sodium chloride flush  3 mL Intravenous Q12H   Continuous Infusions: . sodium chloride 75 mL/hr at 03/11/20 0851  . pantoprozole (PROTONIX) infusion 8 mg/hr (03/11/20 0844)   PRN Meds:.acetaminophen **OR** acetaminophen, albuterol, ondansetron **OR** ondansetron (ZOFRAN) IV   PHYSICAL EXAM: Vital signs: Vitals:   03/10/20 2017 03/11/20 0001 03/11/20 0430 03/11/20 0800  BP: 91/63  (!) 82/63 108/65  Pulse: 91 100 97   Resp: 17  19 20   Temp:  98.2 F (36.8 C) 98 F (36.7 C) 97.9 F (36.6 C)  TempSrc:  Oral Oral Oral  SpO2: 97% 97% 95% 95%  Weight:      Height:       Filed Weights   03/10/20 0857 03/10/20 1415 03/10/20 1649  Weight: 79.4 kg 79.4 kg 81.6 kg   Body mass index is 32.9 kg/m.   Gen Exam:Alert awake-not in any distress HEENT:atraumatic, normocephalic Chest: B/L clear to auscultation anteriorly CVS:S1S2 regular Abdomen:soft non tender, non distended Extremities:no edema Neurology: Non focal Skin: no rash  I have personally reviewed following labs and imaging studies  LABORATORY DATA: CBC: Recent Labs  Lab 03/10/20 0912 03/10/20 1241 03/10/20 1803 03/11/20 0832  WBC 19.9*  --   --  11.3*  HGB 11.6* 11.2* 10.7* 8.1*  HCT 36.4 35.3* 33.8* 26.0*  MCV 98.4  --    --  101.2*  PLT 331  --   --  [id number]    Basic Metabolic Panel: Recent Labs  Lab 03/10/20 0912 03/11/20 0638  NA 142 142  K 4.0 4.0  CL 107 112*  CO2 24 18*  GLUCOSE 137* 93  BUN 57* 34*  CREATININE 0.93 0.95  CALCIUM 9.6 8.5*    GFR: Estimated Creatinine Clearance: 49.9 mL/min (by C-G formula based on SCr of 0.95 mg/dL).  Liver Function Tests: Recent Labs  Lab 03/10/20 0912  AST 24  ALT 27  ALKPHOS 51  BILITOT 0.9  PROT 6.1*  ALBUMIN 3.6   No results for input(s): LIPASE, AMYLASE in the last 168 hours. No results for input(s): AMMONIA in the last 168 hours.  Coagulation Profile: Recent Labs  Lab 03/10/20 1123  INR 1.0    Cardiac Enzymes: No results for input(s): CKTOTAL, CKMB, CKMBINDEX, TROPONINI in the last 168 hours.  BNP (last 3 results) No results for input(s): PROBNP in the last 8760 hours.  Lipid Profile: No results for input(s): CHOL, HDL, LDLCALC, TRIG, CHOLHDL, LDLDIRECT in the last 72 hours.  Thyroid Function Tests: No results for input(s): TSH, T4TOTAL, FREET4, T3FREE, THYROIDAB in the last 72 hours.  Anemia Panel: No results for input(s): VITAMINB12, FOLATE, FERRITIN, TIBC, IRON, RETICCTPCT in the last 72 hours.  Urine analysis:    Component Value Date/Time   COLORURINE STRAW (A) 03/10/2020 1152   APPEARANCEUR CLEAR 03/10/2020 1152   LABSPEC 1.009 03/10/2020 1152   PHURINE 5.0 03/10/2020 1152   GLUCOSEU NEGATIVE 03/10/2020 1152   HGBUR LARGE (A) 03/10/2020 1152   BILIRUBINUR NEGATIVE 03/10/2020 1152   KETONESUR NEGATIVE 03/10/2020 1152   PROTEINUR NEGATIVE 03/10/2020 1152   NITRITE NEGATIVE 03/10/2020 1152   LEUKOCYTESUR TRACE (A) 03/10/2020 1152    Sepsis Labs: Lactic Acid, Venous    Component Value Date/Time   LATICACIDVEN 1.6 10/30/2012 0420    MICROBIOLOGY: Recent Results (from the past 240 hour(s))  Respiratory Panel by RT PCR (Flu A&B, Covid) - Nasopharyngeal Swab     Status: None   Collection Time: 03/10/20 11:35 AM    Specimen: Nasopharyngeal Swab  Result Value Ref Range Status   SARS Coronavirus 2 by RT PCR NEGATIVE NEGATIVE Final    Comment: (NOTE) SARS-CoV-2 target nucleic acids are NOT DETECTED. The SARS-CoV-2 RNA is generally detectable in upper respiratoy specimens during the acute phase of infection. The lowest concentration of SARS-CoV-2 viral copies this assay can detect is 131 copies/mL. A negative result does not preclude SARS-Cov-2 infection and should not be used as the sole basis for treatment or other patient management decisions. A negative result may occur with  improper specimen collection/handling, submission of specimen other  than nasopharyngeal swab, presence of viral mutation(s) within the areas targeted by this assay, and inadequate number of viral copies (<131 copies/mL). A negative result must be combined with clinical observations, patient history, and epidemiological information. The expected result is Negative. Fact Sheet for Patients:  PinkCheek.be Fact Sheet for Healthcare Providers:  GravelBags.it This test is not yet ap proved or cleared by the [place] FDA and  has been authorized for detection and/or diagnosis of SARS-CoV-2 by FDA under an Emergency Use Authorization (EUA). This EUA will remain  in effect (meaning this test can be used) for the duration of the COVID-19 declaration under Section 564(b)(1) of the Act, 21 U.S.C. section 360bbb-3(b)(1), unless the authorization is terminated or revoked sooner.    Influenza A by PCR NEGATIVE NEGATIVE Final   Influenza B by PCR NEGATIVE NEGATIVE Final    Comment: (NOTE) The Xpert Xpress SARS-CoV-2/FLU/RSV assay is intended as an aid in  the diagnosis of influenza from Nasopharyngeal swab specimens and  should not be used as a sole basis for treatment. Nasal washings and  aspirates are unacceptable for Xpert Xpress SARS-CoV-2/FLU/RSV  testing. Fact  Sheet for Patients: PinkCheek.be Fact Sheet for Healthcare Providers: GravelBags.it This test is not yet approved or cleared by the [place] FDA and  has been authorized for detection and/or diagnosis of SARS-CoV-2 by  FDA under an Emergency Use Authorization (EUA). This EUA will remain  in effect (meaning this test can be used) for the duration of the  Covid-19 declaration under Section 564(b)(1) of the Act, 21  U.S.C. section 360bbb-3(b)(1), unless the authorization is  terminated or revoked. Performed at [facility], [place] [address], [place], [place] [address]   MRSA PCR Screening     Status: None   Collection Time: 03/10/20  6:28 PM   Specimen: Nasopharyngeal  Result Value Ref Range Status   MRSA by PCR NEGATIVE NEGATIVE Final    Comment:        The GeneXpert MRSA Assay (FDA approved for NASAL specimens only), is one component of a comprehensive MRSA colonization surveillance program. It is not intended to diagnose MRSA infection nor to guide or monitor treatment for MRSA infections. Performed at [facility], [place] [address], [place], [place] [address]     RADIOLOGY STUDIES/RESULTS: DG CHEST PORT 1 VIEW  Result Date: 03/10/2020 CLINICAL DATA:  Generalized weakness since yesterday. EXAM: PORTABLE CHEST 1 VIEW COMPARISON:  12/05/2012 FINDINGS: Lungs are adequately inflated demonstrate no focal airspace consolidation or effusion. Cardiomediastinal silhouette and remainder of the exam is unchanged. IMPRESSION: No active disease. Electronically Signed   By: Marin Olp M.D.   On: 03/10/2020 13:41     LOS: 1 day   Oren Binet, MD  [facility]    To contact the attending provider between 7A-7P or the covering provider during after hours 7P-7A, please log into the web site [email or web address] and access using universal [place] password for that web site. If you do not have the password,  please call the hospital operator.  03/11/2020, 10:43 AM

## 2020-03-11 NOTE — Anesthesia Postprocedure Evaluation (Signed)
Anesthesia Post Note  Patient: Samantha King  Procedure(s) Performed: ESOPHAGOGASTRODUODENOSCOPY (EGD) WITH PROPOFOL (N/A ) HEMOSTASIS CLIP PLACEMENT HEMOSTASIS CONTROL     Patient location during evaluation: Endoscopy Anesthesia Type: MAC Level of consciousness: awake Pain management: pain level controlled Vital Signs Assessment: post-procedure vital signs reviewed and stable Respiratory status: spontaneous breathing Cardiovascular status: stable Postop Assessment: no apparent nausea or vomiting Anesthetic complications: no    Last Vitals:  Vitals:   03/10/20 2017 03/11/20 0001  BP: 91/63   Pulse: 91 100  Resp: 17   Temp:  36.8 C  SpO2: 97% 97%    Last Pain:  Vitals:   03/11/20 0001  TempSrc: Oral  PainSc: 0-No pain                 Cena Bruhn

## 2020-03-11 NOTE — Progress Notes (Signed)
[facility] Progress Note  CC:  GIB  Subjective:  EGD on 5/1 showed the follow:  - Normal esophagus. - Gastritis. - Visible vessel with active, brisk bleeding in the proximal duodenal bulb. Despite the presence of ulcer on the contralateral duodenal wall, there was no apparent ulcer surrounding the bleeding vessel, raising suspicion for Dieulafoy lesion. This was successfully managed with hemostatic clips and Epinephrine injection, with complete cessation of bleeding. - Non-bleeding duodenal ulcer with no stigmata of bleeding. - No specimens collected.  Objective:  Vital signs in last 24 hours: Temp:  [97.7 F (36.5 C)-98.2 F (36.8 C)] 97.9 F (36.6 C) (05/02 0800) Pulse Rate:  [56-100] 97 (05/02 0430) Resp:  [14-22] 20 (05/02 0800) BP: (82-159)/(58-90) 108/65 (05/02 0800) SpO2:  [93 %-100 %] 95 % (05/02 0800) Weight:  [79.4 kg-81.6 kg] 81.6 kg (05/01 1649) Last BM Date: 03/10/20 General:  Alert, Well-developed, in NAD Heart:  Regular rate and rhythm; no murmurs Pulm:  CTAB.  No increased WOB. Abdomen:  Soft, non-distended.  BS present.  Non-tender. Extremities:  Without edema. Neurologic:  Alert and oriented x 4;  grossly normal neurologically. Psych:  Alert and cooperative. Normal mood and affect.  Intake/Output from previous day: 05/01 0701 - 05/02 0700 In: 2663.8 [P.O.:882; I.V.:1187.7; IV Piggyback:594.1] Out: -   Lab Results: Recent Labs    03/10/20 0912 03/10/20 0912 03/10/20 1241 03/10/20 1803 03/11/20 0832  WBC 19.9*  --   --   --  11.3*  HGB 11.6*   < > 11.2* 10.7* 8.1*  HCT 36.4   < > 35.3* 33.8* 26.0*  PLT 331  --   --   --  209   < > = values in this interval not displayed.   BMET Recent Labs    03/10/20 0912 03/11/20 0638  NA 142 142  K 4.0 4.0  CL 107 112*  CO2 24 18*  GLUCOSE 137* 93  BUN 57* 34*  CREATININE 0.93 0.95  CALCIUM 9.6 8.5*   LFT Recent Labs    03/10/20 0912  PROT 6.1*  ALBUMIN 3.6  AST 24  ALT  27  ALKPHOS 51  BILITOT 0.9  BILIDIR <0.1  IBILI NOT CALCULATED   PT/INR Recent Labs    03/10/20 1123  LABPROT 12.7  INR 1.0   DG CHEST PORT 1 VIEW  Result Date: 03/10/2020 CLINICAL DATA:  Generalized weakness since yesterday. EXAM: PORTABLE CHEST 1 VIEW COMPARISON:  12/05/2012 FINDINGS: Lungs are adequately inflated demonstrate no focal airspace consolidation or effusion. Cardiomediastinal silhouette and remainder of the exam is unchanged. IMPRESSION: No active disease. Electronically Signed   By: Marin Olp M.D.   On: 03/10/2020 13:41   Assessment / Plan: *Melena/black stools that have been Hemoccult positive here in the ED with associated dizziness, nausea, hypotension, diaphoresis.  Hemoglobin close to normal here this morning at 11.6 g, but likely expect that to drop.  BUN elevated at 57 on admission.  Dieulafoy with visible vessel seen on EGD 5/1, treated with clips and epi injection. *ABLA:  Hgb 11.6 grams>10.7>8.1 grams this AM. *History of peptic ulcer disease seen by endoscopy years ago in [place] treated with PPI, but no history of bleeding at that time. *History of coronary artery disease and STEMI with V. fib arrest in 2013 on chronic aspirin and Plavix at home. *Leukocytosis: Appears that this is been a problem dating back at least 7 years, but question source or diagnosis.   -Will change pantoprazole to  40 mg PO BID for 6 weeks to promote mucosal healing and adequate clot formation, then resume at 40 mg/day for ongoing gastric prophylaxis and GERD management. - Review with Cardiology whether continued dual antiplatelet therapy is needed. If Plavix is still needed, recommend waiting at least 1 week before restarting. Can otherwise treat with ASA 81 mg, starting again starting tomorrow, 5/3. - Repeat upper endoscopy in 6-8 weeks to check healing. If gastritis and duodenal ulcer still present, plan for biopsies at that time to assess for H pylori (would need to hold Plavix  again for repeat EGD and biopsies) - Monitor Hgb and transfuse prn.   -Soft diet. -If hgb stable tomorrow AM and no further sign of bleeding then ok for discharge tomorrow. -Will arrange for OV follow-up.   LOS: 1 day   Laban Emperor. Zayda Angell  03/11/2020, 9:19 AM

## 2020-03-12 ENCOUNTER — Telehealth: Payer: Self-pay

## 2020-03-12 DIAGNOSIS — K3182 Dieulafoy lesion (hemorrhagic) of stomach and duodenum: Secondary | ICD-10-CM

## 2020-03-12 LAB — BASIC METABOLIC PANEL
Anion gap: 6 (ref 5–15)
BUN: 11 mg/dL (ref 8–23)
CO2: 23 mmol/L (ref 22–32)
Calcium: 8.5 mg/dL — ABNORMAL LOW (ref 8.9–10.3)
Chloride: 112 mmol/L — ABNORMAL HIGH (ref 98–111)
Creatinine, Ser: 0.75 mg/dL (ref 0.44–1.00)
GFR calc Af Amer: >60 mL/min (ref >60)
GFR calc non Af Amer: >60 mL/min (ref >60)
Glucose, Bld: 106 mg/dL — ABNORMAL HIGH (ref 70–99)
Potassium: 3.9 mmol/L (ref 3.5–5.1)
Sodium: 141 mmol/L (ref 135–145)

## 2020-03-12 LAB — CBC
HCT: 26.6 % — ABNORMAL LOW (ref 36.0–46.0)
Hemoglobin: 8.3 g/dL — ABNORMAL LOW (ref 12.0–15.0)
MCH: 31.3 pg (ref 26.0–34.0)
MCHC: 31.2 g/dL (ref 30.0–36.0)
MCV: 100.4 fL — ABNORMAL HIGH (ref 80.0–100.0)
Platelets: 241 10*3/uL (ref 150–400)
RBC: 2.65 MIL/uL — ABNORMAL LOW (ref 3.87–5.11)
RDW: 15.1 % (ref 11.5–15.5)
WBC: 10.6 10*3/uL — ABNORMAL HIGH (ref 4.0–10.5)
nRBC: 0 % (ref 0.0–0.2)

## 2020-03-12 MED ORDER — CLOPIDOGREL BISULFATE 75 MG PO TABS: 75.0000 mg | ORAL_TABLET | Freq: Every day | ORAL | 3 refills | Status: DC

## 2020-03-12 MED ORDER — PANTOPRAZOLE SODIUM 40 MG PO TBEC: 40.0000 mg | DELAYED_RELEASE_TABLET | Freq: Two times a day (BID) | ORAL | 0 refills | Status: DC

## 2020-03-12 NOTE — Care Management (Signed)
Pt deemed stable for discharge home today.  Discharge orders written.  CM reviewed chart for TOC needs -none found.  CM signing off

## 2020-03-12 NOTE — Progress Notes (Signed)
     [facility] Progress Note  Chart reviewed. Discussed with Dr. Sloan Leiter.  OK for discharge to home. Outpatient follow-up at [facility] will be arranged for an office visit in 3 weeks.

## 2020-03-12 NOTE — Discharge Summary (Signed)
PATIENT DETAILS Name: Samantha King Age: 74 y.o. Sex: female Date of Birth: 30-Dec-1945 MRN: [id number]. Admitting Physician: Norval Morton, MD [id number], Fransico Him, MD  Admit Date: 03/10/2020 Discharge date: 03/12/2020  Recommendations for Outpatient Follow-up:  1. Follow up with PCP in 1-2 weeks 2. Please obtain CMP/CBC in one week 3. Please ensure follow-up with cardiology and GI 4. Metoprolol resumed on discharge-furosemide/lisinopril on hold-resume at next visit if BP stable.  Admitted From:  Home  Disposition: [place]: No  Equipment/Devices: None  Discharge Condition: Stable  CODE STATUS: FULL CODE  Diet recommendation:  Diet Order            Diet - low sodium heart healthy        DIET SOFT Room service appropriate? Yes; Fluid consistency: Thin  Diet effective now              Brief Narrative: Patient is a 74 y.o. female with history of HTN, HLD, history of V. fib arrest due to MI-s/p PCI with DES in 2013-presented with weakness, melanotic stools-subsequently admitted to the hospitalist service.  See  below for further details  Significant events: 5/1>> admit to [facility] for upper GI bleeding  Antimicrobial therapy: None  Microbiology data: None  Procedures : 5/1>> EGD: Visible vessel with active brisk bleeding in the proximal duodenal bulb-likely Dieulafoy lesion  Consults: GI  Brief Hospital Course: Upper GI bleeding with blood loss anemia: No bleeding for close to 3 days-EGD with high risk finding-see above.  Has dropped around 3-4 g of hemoglobin but has stabilized since yesterday.  Spoke with GI-okay to discharge-continue PPI.  GI will arrange for outpatient follow-up.   CAD with history of cardiac arrest-s/p PCI in 2013: No anginal symptoms-antiplatelets on hold due to active GI bleeding.  Per GI-resume Plavix 7 in days-spoke with Dr. Carola Rhine on call-Case discussed-okay to hold Plavix for a week.    HLD: Continue statin  HTN: All antihypertensives held-but BP slowly creeping up-we will resume metoprolol on discharge-PCP to assess at next follow-up whether Lasix and furosemide can be resumed.  Obesity: Estimated body mass index is 32.9 kg/m as calculated from the following:   Height as of this encounter: 5\' 2"  (1.575 m).   Weight as of this encounter: 81.6 kg.   Discharge Diagnoses:  Principal Problem:   Upper GI bleed Active Problems:   Hypertension   GERD (gastroesophageal reflux disease)   Acute blood loss anemia   Hyperlipidemia   Coronary atherosclerosis of native coronary artery   Nausea   Duodenal ulcer   Dieulafoy lesion of duodenum   Gastritis and gastroduodenitis   Discharge Instructions:  Activity:  As tolerated   Discharge Instructions    Call MD for:   Complete by: As directed    Bloody or black stools   Call MD for:  difficulty breathing, headache or visual disturbances   Complete by: As directed    Diet - low sodium heart healthy   Complete by: As directed    Discharge instructions   Complete by: As directed    1) resume Plavix on 5/8  2.)  Resuming metoprolol on discharge-continue to hold Lasix and lisinopril-follow with you primary care practitioner in 1 week-and resume accordingly.  3) you will need to follow-up with GI and cardiology, office will call you for a follow-up appointment   Follow with Primary MD  Tisovec, Fransico Him, MD in 1-2 weeks  Please get a complete blood count  and chemistry panel checked by your Primary MD at your next visit, and again as instructed by your Primary MD.  Get Medicines reviewed and adjusted: Please take all your medications with you for your next visit with your Primary MD  Laboratory/radiological data: Please request your Primary MD to go over all hospital tests and procedure/radiological results at the follow up, please ask your Primary MD to get all Hospital records sent to his/her office.  In  some cases, they will be blood work, cultures and biopsy results pending at the time of your discharge. Please request that your primary care M.D. follows up on these results.  Also Note the following: If you experience worsening of your admission symptoms, develop shortness of breath, life threatening emergency, suicidal or homicidal thoughts you must seek medical attention immediately by calling 911 or calling your MD immediately  if symptoms less severe.  You must read complete instructions/literature along with all the possible adverse reactions/side effects for all the Medicines you take and that have been prescribed to you. Take any new Medicines after you have completely understood and accpet all the possible adverse reactions/side effects.   Do not drive when taking Pain medications or sleeping medications (Benzodaizepines)  Do not take more than prescribed Pain, Sleep and Anxiety Medications. It is not advisable to combine anxiety,sleep and pain medications without talking with your primary care practitioner  Special Instructions: If you have smoked or chewed Tobacco  in the last 2 yrs please stop smoking, stop any regular Alcohol  and or any Recreational drug use.  Wear Seat belts while driving.  Please note: You were cared for by a hospitalist during your hospital stay. Once you are discharged, your primary care physician will handle any further medical issues. Please note that NO REFILLS for any discharge medications will be authorized once you are discharged, as it is imperative that you return to your primary care physician (or establish a relationship with a primary care physician if you do not have one) for your post hospital discharge needs so that they can reassess your need for medications and monitor your lab values.   Increase activity slowly   Complete by: As directed      Allergies as of 03/12/2020      Reactions   Shellfish Allergy Nausea And Vomiting   Codeine Other  (See Comments)   Makes me hyperactive and not able to sleep   Lidocaine Palpitations      Medication List    STOP taking these medications   aspirin 325 MG EC tablet   furosemide 40 MG tablet Commonly known as: LASIX   lisinopril 5 MG tablet Commonly known as: ZESTRIL     TAKE these medications   acetaminophen 325 MG tablet Commonly known as: TYLENOL Take 650 mg by mouth every 6 (six) hours as needed. For headache or pain   atorvastatin 80 MG tablet Commonly known as: LIPITOR Take 1 tablet (80 mg total) by mouth daily at 6 PM.   clopidogrel 75 MG tablet Commonly known as: PLAVIX Take 1 tablet (75 mg total) by mouth daily. Start taking on: Mar 17, 2020 What changed: These instructions start on Mar 17, 2020. If you are unsure what to do until then, ask your doctor or other care provider.   escitalopram 5 MG tablet Commonly known as: LEXAPRO Take 5 mg by mouth daily.   Krill Oil 500 MG Caps Take 1 capsule by mouth daily.   metoprolol tartrate 25 MG tablet  Commonly known as: LOPRESSOR Take 1 tablet (25 mg total) by mouth 2 (two) times daily.   nitroGLYCERIN 0.4 MG SL tablet Commonly known as: NITROSTAT DISSOLVE ONE TABLET UNDER THE TONGUE EVERY FIVE MINUTES AS NEEDED FOR CHEST PAIN. DO NOT EXCEED A TOTAL OF THREE DOSES IN 15 MINUTES What changed:   how much to take  how to take this  when to take this  reasons to take this  additional instructions   pantoprazole 40 MG tablet Commonly known as: PROTONIX Take 1 tablet (40 mg total) by mouth 2 (two) times daily. TAKE TWICE DAILY FOR 6 WEEKS, THEN ONCE DAILY What changed:   when to take this  additional instructions   ProAir RespiClick [id number] (90 Base) MCG/ACT Aepb Generic drug: Albuterol Sulfate Take 2 puffs by mouth every 6 (six) hours as needed for wheezing or shortness of breath.   PROBIOTIC PO Take 1 capsule by mouth daily.   psyllium 0.52 g capsule Commonly known as: REGULOID Take 0.52 g by mouth  daily.   Symbicort 160-4.5 MCG/ACT inhaler Generic drug: budesonide-formoterol Inhale 2 puffs into the lungs daily.      Follow-up Information    Tisovec, Fransico Him, MD. Schedule an appointment as soon as possible for a visit in 1 week(s).   Specialty: Internal Medicine Contact information: [place] [address] [phone]        Burnell Blanks, MD. Schedule an appointment as soon as possible for a visit in 2 week(s).   Specialty: Cardiology Contact information: [place] [address] [place] [address] [phone]        Alonza Bogus D, PA-C Follow up.   Specialty: Gastroenterology Why: OFFICE WILL CALL FOR A FOLLOW UP APPT Contact information: [place] [address] [phone]          Allergies  Allergen Reactions  . Shellfish Allergy Nausea And Vomiting  . Codeine Other (See Comments)    Makes me hyperactive and not able to sleep  . Lidocaine Palpitations    Other Procedures/Studies: DG CHEST PORT 1 VIEW  Result Date: 03/10/2020 CLINICAL DATA:  Generalized weakness since yesterday. EXAM: PORTABLE CHEST 1 VIEW COMPARISON:  12/05/2012 FINDINGS: Lungs are adequately inflated demonstrate no focal airspace consolidation or effusion. Cardiomediastinal silhouette and remainder of the exam is unchanged. IMPRESSION: No active disease. Electronically Signed   By: Marin Olp M.D.   On: 03/10/2020 13:41    TODAY-DAY OF DISCHARGE:  Subjective:   Avrie Sucharski today has no headache,no chest abdominal pain,no new weakness tingling or numbness, feels much better wants to go home today.   Objective:   Blood pressure 135/70, pulse 97, temperature 98.7 F (37.1 C), temperature source Oral, resp. rate 18, height 5\' 2"  (1.575 m), weight 81.6 kg, SpO2 94 %.  Intake/Output Summary (Last 24 hours) at 03/12/2020 1048 Last data filed at 03/12/2020 0955 Gross per 24 hour  Intake 1031.88 ml  Output 1550 ml  Net  -518.12 ml   Filed Weights   03/10/20 0857 03/10/20 1415 03/10/20 1649  Weight: 79.4 kg 79.4 kg 81.6 kg    Exam: Awake Alert, Oriented *3, No new F.N deficits, Normal affect [place].AT,PERRAL Supple Neck,No JVD, No cervical lymphadenopathy appriciated.  Symmetrical Chest wall movement, Good air movement bilaterally, CTAB RRR,No Gallops,Rubs or new Murmurs, No Parasternal Heave +ve B.Sounds, Abd Soft, Non tender, No organomegaly appriciated, No rebound -guarding or rigidity. No Cyanosis, Clubbing or edema, No new Rash or bruise  PERTINENT RADIOLOGIC STUDIES: DG CHEST PORT 1 VIEW  Result Date: 03/10/2020 CLINICAL DATA:  Generalized weakness since yesterday. EXAM: PORTABLE CHEST 1 VIEW COMPARISON:  12/05/2012 FINDINGS: Lungs are adequately inflated demonstrate no focal airspace consolidation or effusion. Cardiomediastinal silhouette and remainder of the exam is unchanged. IMPRESSION: No active disease. Electronically Signed   By: Marin Olp M.D.   On: 03/10/2020 13:41     PERTINENT LAB RESULTS: CBC: Recent Labs    03/11/20 1503 03/12/20 0635  WBC 12.8* 10.6*  HGB 8.8* 8.3*  HCT 27.5* 26.6*  PLT 244 241   CMET CMP     Component Value Date/Time   NA 141 03/12/2020 0635   K 3.9 03/12/2020 0635   K 4.3 10/13/2013 1647   CL 112 (H) 03/12/2020 0635   CO2 23 03/12/2020 0635   GLUCOSE 106 (H) 03/12/2020 0635   BUN 11 03/12/2020 0635   CREATININE 0.75 03/12/2020 0635   CALCIUM 8.5 (L) 03/12/2020 0635   PROT 6.1 (L) 03/10/2020 0912   ALBUMIN 3.6 03/10/2020 0912   AST 24 03/10/2020 0912   ALT 27 03/10/2020 0912   ALKPHOS 51 03/10/2020 0912   BILITOT 0.9 03/10/2020 0912   GFRNONAA >60 03/12/2020 0635   GFRAA >60 03/12/2020 0635    GFR Estimated Creatinine Clearance: 59.2 mL/min (by C-G formula based on SCr of 0.75 mg/dL). No results for input(s): LIPASE, AMYLASE in the last 72 hours. No results for input(s): CKTOTAL, CKMB, CKMBINDEX, TROPONINI in the last 72  hours. Invalid input(s): POCBNP No results for input(s): DDIMER in the last 72 hours. No results for input(s): HGBA1C in the last 72 hours. No results for input(s): CHOL, HDL, LDLCALC, TRIG, CHOLHDL, LDLDIRECT in the last 72 hours. No results for input(s): TSH, T4TOTAL, T3FREE, THYROIDAB in the last 72 hours.  Invalid input(s): FREET3 No results for input(s): VITAMINB12, FOLATE, FERRITIN, TIBC, IRON, RETICCTPCT in the last 72 hours. Coags: Recent Labs    03/10/20 1123  INR 1.0   Microbiology: Recent Results (from the past 240 hour(s))  Respiratory Panel by RT PCR (Flu A&B, Covid) - Nasopharyngeal Swab     Status: None   Collection Time: 03/10/20 11:35 AM   Specimen: Nasopharyngeal Swab  Result Value Ref Range Status   SARS Coronavirus 2 by RT PCR NEGATIVE NEGATIVE Final    Comment: (NOTE) SARS-CoV-2 target nucleic acids are NOT DETECTED. The SARS-CoV-2 RNA is generally detectable in upper respiratoy specimens during the acute phase of infection. The lowest concentration of SARS-CoV-2 viral copies this assay can detect is 131 copies/mL. A negative result does not preclude SARS-Cov-2 infection and should not be used as the sole basis for treatment or other patient management decisions. A negative result may occur with  improper specimen collection/handling, submission of specimen other than nasopharyngeal swab, presence of viral mutation(s) within the areas targeted by this assay, and inadequate number of viral copies (<131 copies/mL). A negative result must be combined with clinical observations, patient history, and epidemiological information. The expected result is Negative. Fact Sheet for Patients:  PinkCheek.be Fact Sheet for Healthcare Providers:  GravelBags.it This test is not yet ap proved or cleared by the [place] FDA and  has been authorized for detection and/or diagnosis of SARS-CoV-2 by FDA under an  Emergency Use Authorization (EUA). This EUA will remain  in effect (meaning this test can be used) for the duration of the COVID-19 declaration under Section 564(b)(1) of the Act, 21 U.S.C. section 360bbb-3(b)(1), unless the authorization is terminated  or revoked sooner.    Influenza A by PCR NEGATIVE NEGATIVE Final   Influenza B by PCR NEGATIVE NEGATIVE Final    Comment: (NOTE) The Xpert Xpress SARS-CoV-2/FLU/RSV assay is intended as an aid in  the diagnosis of influenza from Nasopharyngeal swab specimens and  should not be used as a sole basis for treatment. Nasal washings and  aspirates are unacceptable for Xpert Xpress SARS-CoV-2/FLU/RSV  testing. Fact Sheet for Patients: PinkCheek.be Fact Sheet for Healthcare Providers: GravelBags.it This test is not yet approved or cleared by the [place] FDA and  has been authorized for detection and/or diagnosis of SARS-CoV-2 by  FDA under an Emergency Use Authorization (EUA). This EUA will remain  in effect (meaning this test can be used) for the duration of the  Covid-19 declaration under Section 564(b)(1) of the Act, 21  U.S.C. section 360bbb-3(b)(1), unless the authorization is  terminated or revoked. Performed at [facility], [place] [address] [place], [place] [address]   MRSA PCR Screening     Status: None   Collection Time: 03/10/20  6:28 PM   Specimen: Nasopharyngeal  Result Value Ref Range Status   MRSA by PCR NEGATIVE NEGATIVE Final    Comment:        The GeneXpert MRSA Assay (FDA approved for NASAL specimens only), is one component of a comprehensive MRSA colonization surveillance program. It is not intended to diagnose MRSA infection nor to guide or monitor treatment for MRSA infections. Performed at [facility], [place] [address] [place], [place] [address]     FURTHER DISCHARGE INSTRUCTIONS:  Get Medicines reviewed and adjusted: Please take  all your medications with you for your next visit with your Primary MD  Laboratory/radiological data: Please request your Primary MD to go over all hospital tests and procedure/radiological results at the follow up, please ask your Primary MD to get all Hospital records sent to his/her office.  In some cases, they will be blood work, cultures and biopsy results pending at the time of your discharge. Please request that your primary care M.D. goes through all the records of your hospital data and follows up on these results.  Also Note the following: If you experience worsening of your admission symptoms, develop shortness of breath, life threatening emergency, suicidal or homicidal thoughts you must seek medical attention immediately by calling 911 or calling your MD immediately  if symptoms less severe.  You must read complete instructions/literature along with all the possible adverse reactions/side effects for all the Medicines you take and that have been prescribed to you. Take any new Medicines after you have completely understood and accpet all the possible adverse reactions/side effects.   Do not drive when taking Pain medications or sleeping medications (Benzodaizepines)  Do not take more than prescribed Pain, Sleep and Anxiety Medications. It is not advisable to combine anxiety,sleep and pain medications without talking with your primary care practitioner  Special Instructions: If you have smoked or chewed Tobacco  in the last 2 yrs please stop smoking, stop any regular Alcohol  and or any Recreational drug use.  Wear Seat belts while driving.  Please note: You were cared for by a hospitalist during your hospital stay. Once you are discharged, your primary care physician will handle any further medical issues. Please note that NO REFILLS for any discharge medications will be authorized once you are discharged, as it is imperative that you return to your primary care physician (or  establish a relationship with  a primary care physician if you do not have one) for your post hospital discharge needs so that they can reassess your need for medications and monitor your lab values.  Total Time spent coordinating discharge including counseling, education and face to face time equals  45 minutes.  SignedOren Binet 03/12/2020 10:48 AM

## 2020-03-12 NOTE — Telephone Encounter (Signed)
appt made for 5/28 at 130 with Jessica Left message on machine to call back

## 2020-03-12 NOTE — Telephone Encounter (Signed)
-----   Message from Loralie Champagne, PA-C sent at 03/11/2020 12:20 PM EDT ----- Please schedule patient to see me in the office in 3 weeks of so.  Hospital follow-up GI bleed.  Thank you,  Jess

## 2020-03-12 NOTE — Telephone Encounter (Signed)
The patient has been notified of this information and all questions answered.

## 2020-03-20 DIAGNOSIS — I2510 Atherosclerotic heart disease of native coronary artery without angina pectoris: Secondary | ICD-10-CM | POA: Diagnosis not present

## 2020-03-20 DIAGNOSIS — K922 Gastrointestinal hemorrhage, unspecified: Secondary | ICD-10-CM | POA: Diagnosis not present

## 2020-03-20 DIAGNOSIS — D500 Iron deficiency anemia secondary to blood loss (chronic): Secondary | ICD-10-CM | POA: Diagnosis not present

## 2020-03-20 DIAGNOSIS — E7800 Pure hypercholesterolemia, unspecified: Secondary | ICD-10-CM | POA: Diagnosis not present

## 2020-03-20 DIAGNOSIS — Z1331 Encounter for screening for depression: Secondary | ICD-10-CM | POA: Diagnosis not present

## 2020-03-20 DIAGNOSIS — M810 Age-related osteoporosis without current pathological fracture: Secondary | ICD-10-CM | POA: Diagnosis not present

## 2020-03-20 DIAGNOSIS — I252 Old myocardial infarction: Secondary | ICD-10-CM | POA: Diagnosis not present

## 2020-03-20 DIAGNOSIS — E669 Obesity, unspecified: Secondary | ICD-10-CM | POA: Diagnosis not present

## 2020-03-20 DIAGNOSIS — I119 Hypertensive heart disease without heart failure: Secondary | ICD-10-CM | POA: Diagnosis not present

## 2020-03-23 ENCOUNTER — Other Ambulatory Visit: Payer: Self-pay

## 2020-03-23 ENCOUNTER — Encounter: Payer: Self-pay | Admitting: Physician Assistant

## 2020-03-23 ENCOUNTER — Ambulatory Visit (INDEPENDENT_AMBULATORY_CARE_PROVIDER_SITE_OTHER): Payer: Medicare Other | Admitting: Physician Assistant

## 2020-03-23 VITALS — BP 120/76 | HR 63 | Ht 62.0 in | Wt 175.0 lb

## 2020-03-23 DIAGNOSIS — I2510 Atherosclerotic heart disease of native coronary artery without angina pectoris: Secondary | ICD-10-CM

## 2020-03-23 DIAGNOSIS — I10 Essential (primary) hypertension: Secondary | ICD-10-CM | POA: Diagnosis not present

## 2020-03-23 DIAGNOSIS — Z8719 Personal history of other diseases of the digestive system: Secondary | ICD-10-CM | POA: Diagnosis not present

## 2020-03-23 DIAGNOSIS — E782 Mixed hyperlipidemia: Secondary | ICD-10-CM | POA: Diagnosis not present
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
## 2020-03-23 NOTE — Patient Instructions (Signed)
Medication Instructions:   Your physician recommends that you continue on your current medications as directed. Please refer to the Current Medication list given to you today.  *If you need a refill on your cardiac medications before your next appointment, please call your pharmacy*  Lab Work:  None ordered today  Testing/Procedures:  None ordered today  Follow-Up: At [facility], you and your health needs are our priority.  As part of our continuing mission to provide you with exceptional heart care, we have created designated Provider Care Teams.  These Care Teams include your primary Cardiologist (physician) and Advanced Practice Providers (APPs -  Physician Assistants and Nurse Practitioners) who all work together to provide you with the care you need, when you need it.  We recommend signing up for the patient portal called "MyChart".  Sign up information is provided on this After Visit Summary.  MyChart is used to connect with patients for Virtual Visits (Telemedicine).  Patients are able to view lab/test results, encounter notes, upcoming appointments, etc.  Non-urgent messages can be sent to your provider as well.   To learn more about what you can do with MyChart, go to [email or web address].    Your next appointment:   9 month(s)  The format for your next appointment:   In Person  Provider:   Lauree Chandler, MD

## 2020-04-02 ENCOUNTER — Other Ambulatory Visit: Payer: Self-pay

## 2020-04-02 ENCOUNTER — Ambulatory Visit
Admission: RE | Admit: 2020-04-02 | Discharge: 2020-04-02 | Disposition: A | Discharge status: Home / Self Care | Payer: Medicare Other | Source: Ambulatory Visit | Attending: Internal Medicine | Admitting: Internal Medicine

## 2020-04-02 DIAGNOSIS — Z1231 Encounter for screening mammogram for malignant neoplasm of breast: Secondary | ICD-10-CM | POA: Diagnosis not present

## 2020-04-02 IMAGING — MG DIGITAL SCREENING BILAT W/ TOMO W/ CAD
6 of 10 series · 6 of 30 positions shown · non-contrast
Comparison: Previous exam(s).

CLINICAL DATA: Screening.

EXAM:
DIGITAL SCREENING BILATERAL MAMMOGRAM WITH TOMO AND CAD

[L CC synth-2D (1 of 2)]
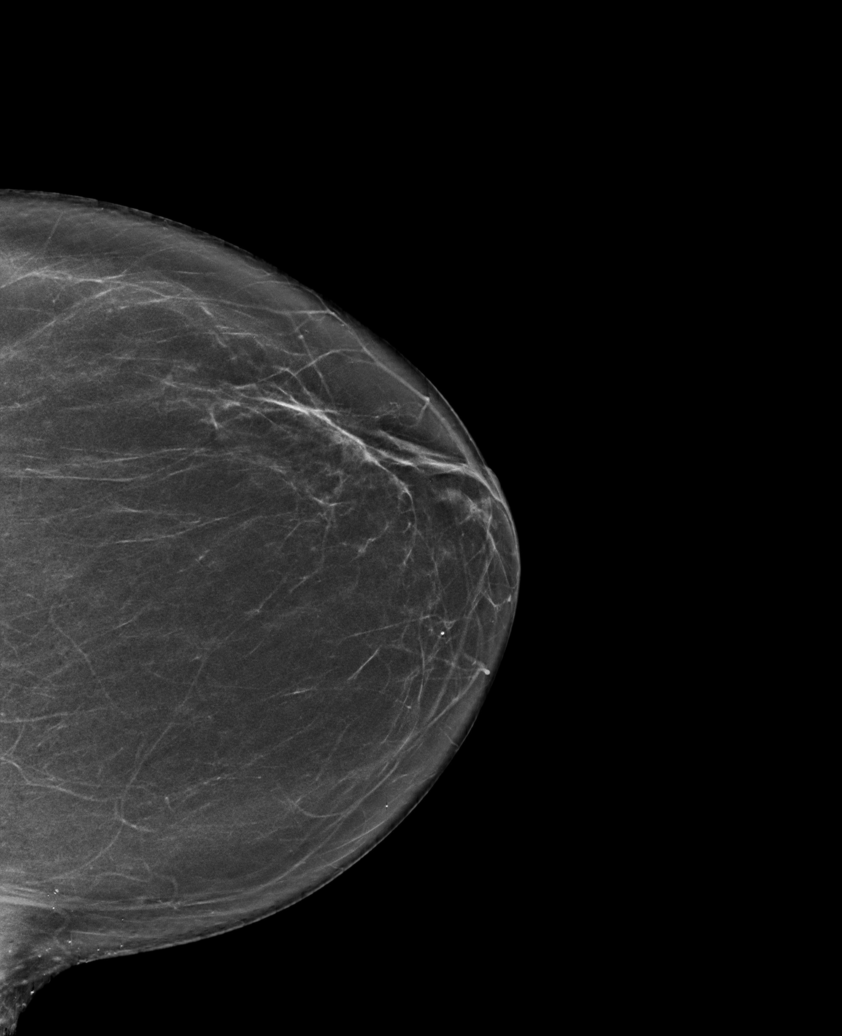

[L MLO synth-2D]
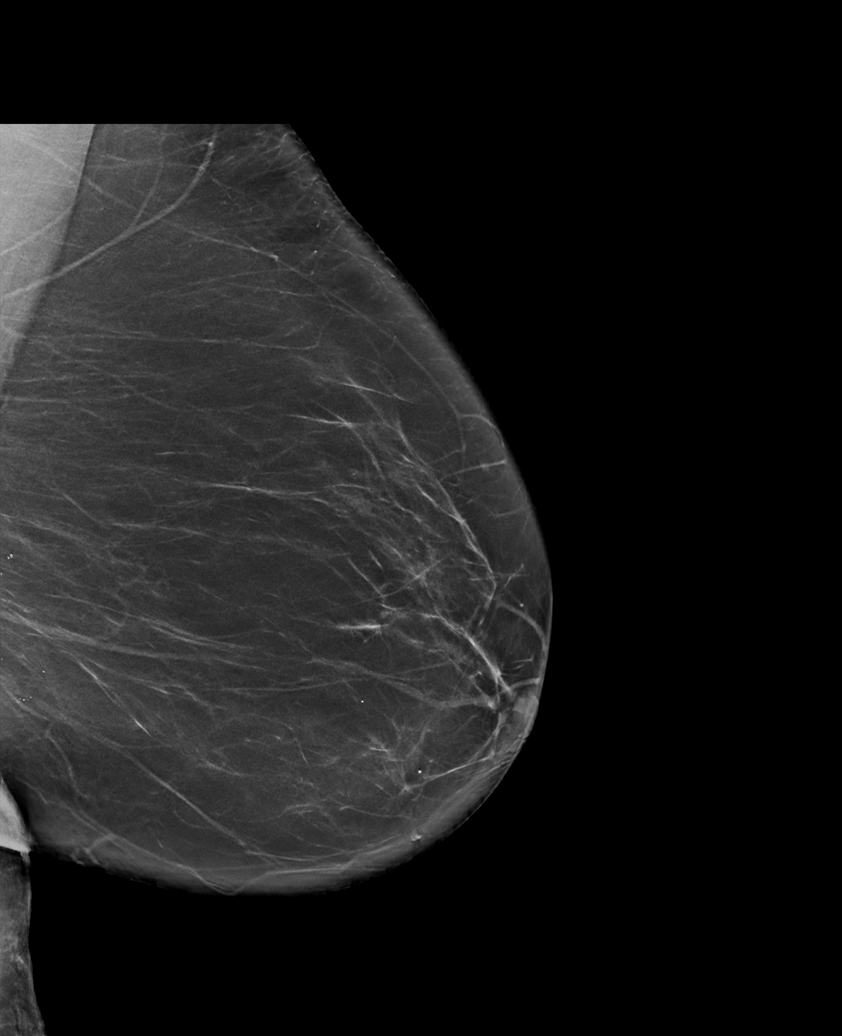

[R MLO synth-2D]
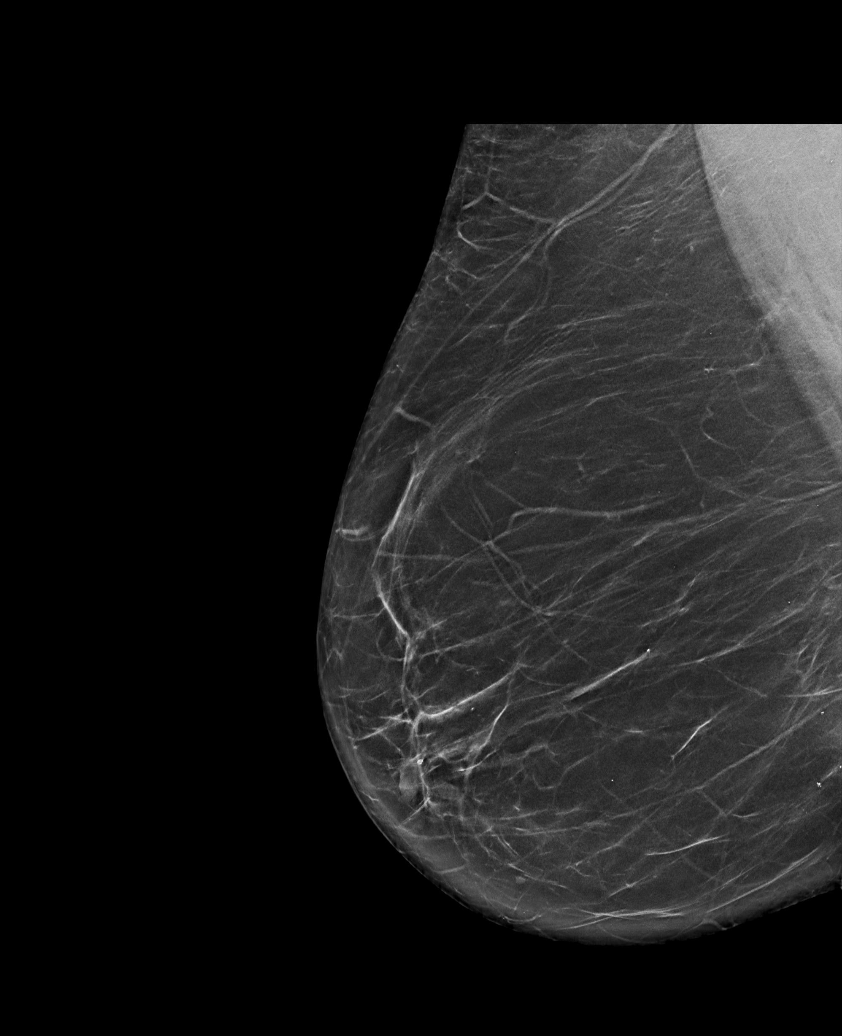

[R CC synth-2D]
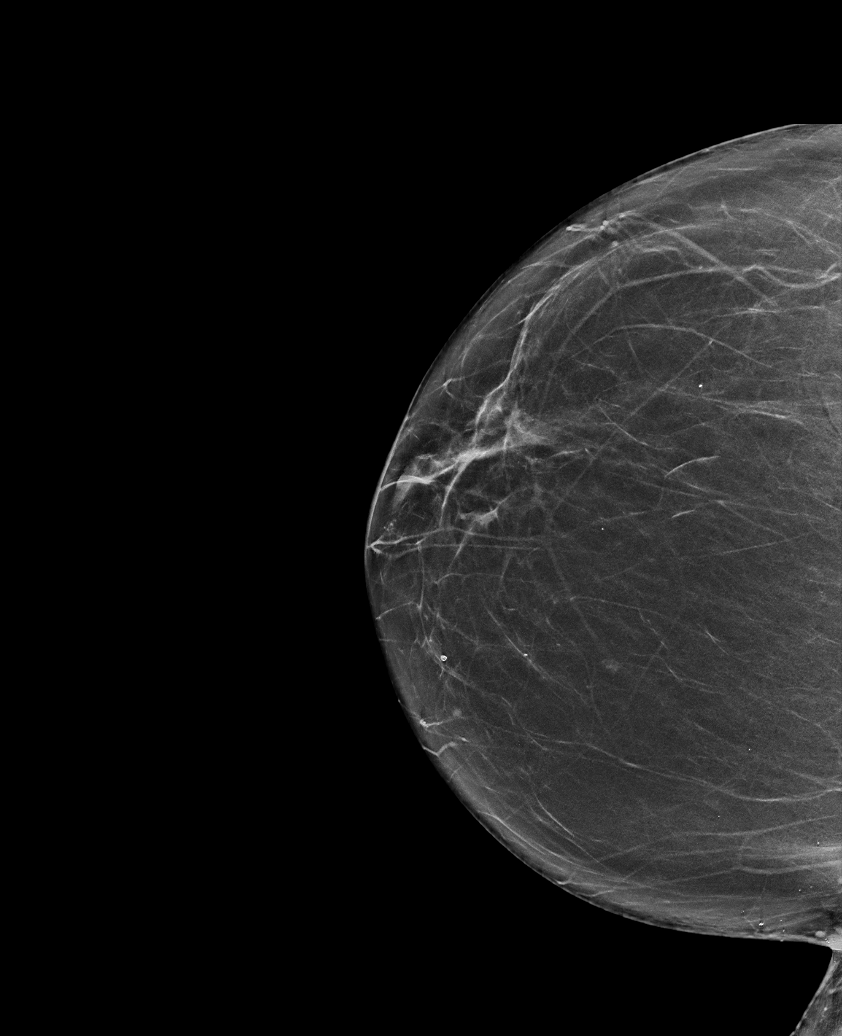

[L CC synth-2D (2 of 2)]
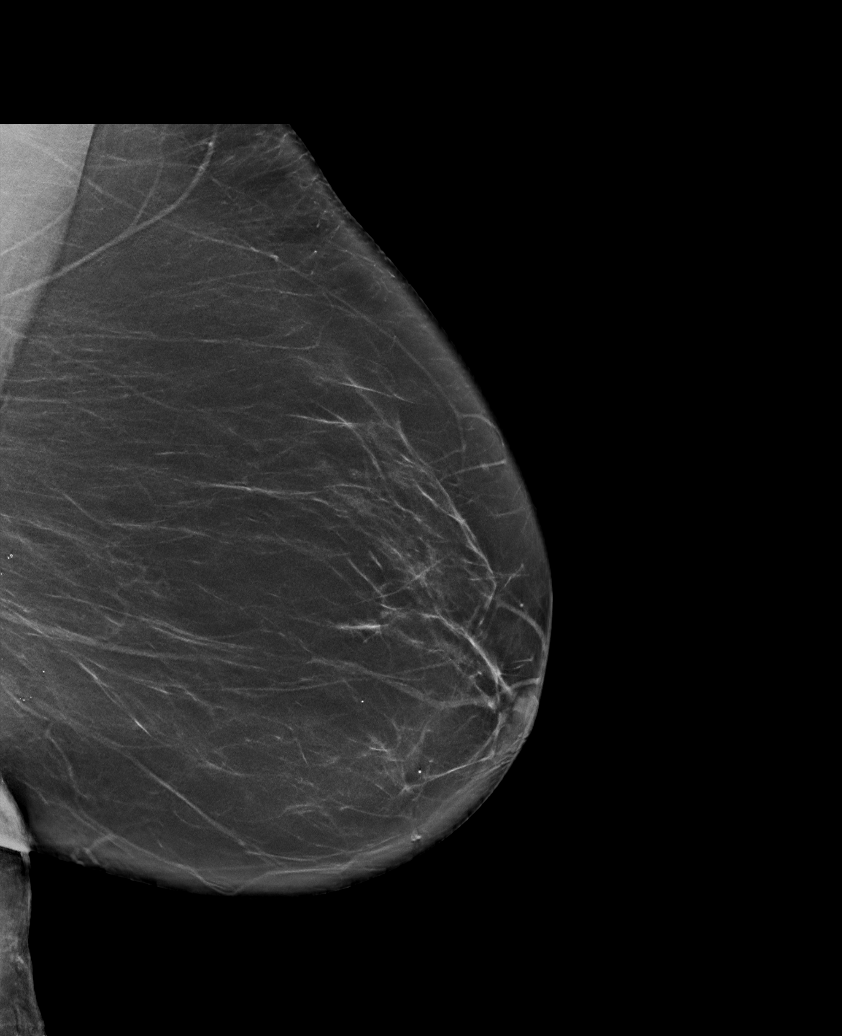

[L CC tomo · tomo slice 34/67.0]
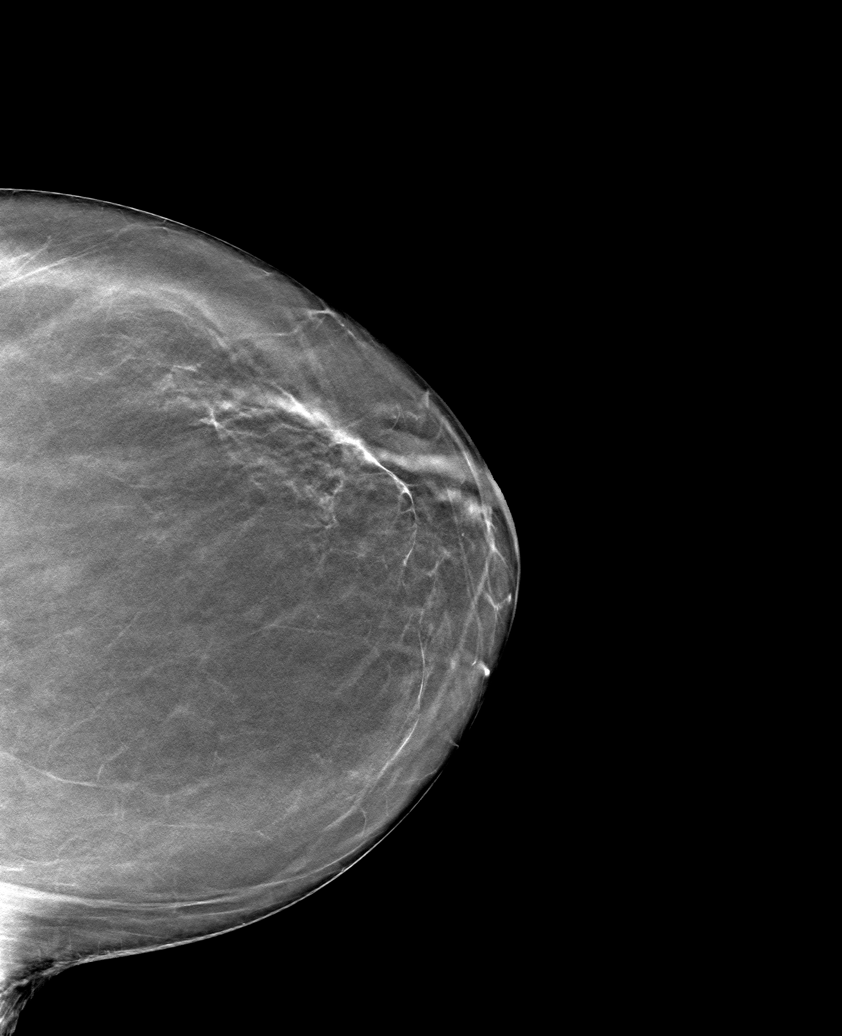

[6 of 30 positions shown; findings below may reference images not displayed]

ACR Breast Density Category b: There are scattered areas of
fibroglandular density.
FINDINGS: There are no findings suspicious for malignancy. Images were
processed with CAD.
IMPRESSION: No mammographic evidence of malignancy. A result letter of this
screening mammogram will be mailed directly to the patient.

RECOMMENDATION:
Screening mammogram in one year. (Code:[TQ])

BI-RADS CATEGORY  1: Negative.

## 2020-04-06 ENCOUNTER — Telehealth: Payer: Self-pay | Admitting: *Deleted

## 2020-04-06 ENCOUNTER — Ambulatory Visit (INDEPENDENT_AMBULATORY_CARE_PROVIDER_SITE_OTHER): Payer: Medicare Other | Admitting: Gastroenterology

## 2020-04-06 ENCOUNTER — Encounter: Payer: Self-pay | Admitting: Gastroenterology

## 2020-04-06 VITALS — BP 120/88 | HR 82 | Ht 63.0 in | Wt 175.0 lb

## 2020-04-06 DIAGNOSIS — I2510 Atherosclerotic heart disease of native coronary artery without angina pectoris: Secondary | ICD-10-CM | POA: Diagnosis not present

## 2020-04-06 DIAGNOSIS — K922 Gastrointestinal hemorrhage, unspecified: Secondary | ICD-10-CM | POA: Diagnosis not present

## 2020-04-06 DIAGNOSIS — K3182 Dieulafoy lesion (hemorrhagic) of stomach and duodenum: Secondary | ICD-10-CM | POA: Diagnosis not present

## 2020-04-06 DIAGNOSIS — D62 Acute posthemorrhagic anemia: Secondary | ICD-10-CM

## 2020-04-06 NOTE — Patient Instructions (Signed)
If you are age 76 or older, your body mass index should be between 23-30. Your Body mass index is 31 kg/m. If this is out of the aforementioned range listed, please consider follow up with your Primary Care Provider.  If you are age 81 or younger, your body mass index should be between 19-25. Your Body mass index is 31 kg/m. If this is out of the aformentioned range listed, please consider follow up with your Primary Care Provider.   You have been scheduled for an endoscopy. Please follow written instructions given to you at your visit today. If you use inhalers (even only as needed), please bring them with you on the day of your procedure.  You will be contacted by our office prior to your procedure for directions on holding your Plavix.  If you do not hear from our office 1 week prior to your scheduled procedure, please call [phone] to discuss.
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
## 2020-04-06 NOTE — Telephone Encounter (Signed)
   Primary Cardiologist: Lauree Chandler, MD  Chart reviewed as part of pre-operative protocol coverage.   Given remote intervention, patient can hold plavix 5 days prior to her upcoming EGD with plans to restart plavix when cleared to do so by her gastroenterologist.   I will route this recommendation to the requesting party via [facility] fax function and remove from pre-op pool.  Please call with questions.  Abigail Butts, PA-C 04/06/2020, 5:09 PM

## 2020-04-06 NOTE — Telephone Encounter (Signed)
[facility] Pre-operative Risk Assessment     Request for surgical clearance:     Endoscopy Procedure  What type of surgery is being performed?     EGD  When is this surgery scheduled?     Wednesday 05/02/20  What type of clearance is required ?   Pharmacy  Are there any medications that need to be held prior to surgery and how long? Plavix 5 days  Practice name and name of physician performing surgery?      [facility]  What is your office phone and fax number?      Phone- [phone]  [phone]  Anesthesia type (None, local, MAC, general) ?       MAC

## 2020-04-10 NOTE — Telephone Encounter (Signed)
Patient informed to hold Plavix 5 days before procedure. Patient voiced understanding. 

## 2020-04-11 NOTE — Addendum Note (Signed)
Addended by: Briant Cedar on: 04/11/2020 12:08 PM   Modules accepted: Orders

## 2020-04-11 NOTE — Progress Notes (Signed)
Agree with the assessment and plan as outlined by Jessica Zehr, PA-C. ? ?Omesha Bowerman, DO, FACG ? ?

## 2020-04-26 DIAGNOSIS — H02831 Dermatochalasis of right upper eyelid: Secondary | ICD-10-CM | POA: Diagnosis not present

## 2020-04-26 DIAGNOSIS — H02834 Dermatochalasis of left upper eyelid: Secondary | ICD-10-CM | POA: Diagnosis not present

## 2020-04-27 DIAGNOSIS — E7800 Pure hypercholesterolemia, unspecified: Secondary | ICD-10-CM | POA: Diagnosis not present

## 2020-04-27 DIAGNOSIS — M810 Age-related osteoporosis without current pathological fracture: Secondary | ICD-10-CM | POA: Diagnosis not present

## 2020-05-02 ENCOUNTER — Other Ambulatory Visit: Payer: Self-pay

## 2020-05-02 ENCOUNTER — Encounter: Payer: Self-pay | Admitting: Gastroenterology

## 2020-05-02 ENCOUNTER — Telehealth: Payer: Self-pay | Admitting: Cardiovascular Disease

## 2020-05-02 ENCOUNTER — Ambulatory Visit (AMBULATORY_SURGERY_CENTER): Payer: Medicare Other | Admitting: Gastroenterology

## 2020-05-02 VITALS — BP 124/72 | HR 73 | Temp 96.6°F | Resp 17 | Ht 63.0 in | Wt 175.0 lb

## 2020-05-02 DIAGNOSIS — K269 Duodenal ulcer, unspecified as acute or chronic, without hemorrhage or perforation: Secondary | ICD-10-CM | POA: Diagnosis not present

## 2020-05-02 DIAGNOSIS — K209 Esophagitis, unspecified without bleeding: Secondary | ICD-10-CM | POA: Diagnosis not present

## 2020-05-02 DIAGNOSIS — K2980 Duodenitis without bleeding: Secondary | ICD-10-CM | POA: Diagnosis not present

## 2020-05-02 DIAGNOSIS — K2970 Gastritis, unspecified, without bleeding: Secondary | ICD-10-CM | POA: Diagnosis present

## 2020-05-02 DIAGNOSIS — K3182 Dieulafoy lesion (hemorrhagic) of stomach and duodenum: Secondary | ICD-10-CM

## 2020-05-02 DIAGNOSIS — K2950 Unspecified chronic gastritis without bleeding: Secondary | ICD-10-CM | POA: Diagnosis not present

## 2020-05-02 MED ORDER — SUCRALFATE 1 GM/10ML PO SUSP
1.0000 g | Freq: Four times a day (QID) | ORAL | 1 refills | Status: DC
Start: 2020-05-02 — End: 2020-08-15

## 2020-05-02 MED ORDER — FAMOTIDINE 20 MG PO TABS
20.0000 mg | ORAL_TABLET | Freq: Two times a day (BID) | ORAL | 2 refills | Status: DC
Start: 2020-05-02 — End: 2021-06-18

## 2020-05-02 MED ORDER — SODIUM CHLORIDE 0.9 % IV SOLN: 500.0000 mL | Freq: Once | INTRAVENOUS | Status: DC

## 2020-05-02 NOTE — Telephone Encounter (Signed)
Called pt to inform her that her medication clopidogrel (Plavix) was already sent to her pharmacy with a year supply and pt stated that she has the clopidogrel. Pt did not know the medication plavix was in a different name, clopidogrel. I advised pt that if she has any other problems, questions or concerns, to give our office a call. Pt verbalized understanding.

## 2020-05-02 NOTE — Progress Notes (Signed)
Called to room to assist during endoscopic procedure.  Patient ID and intended procedure confirmed with present staff. Received instructions for my participation in the procedure from the performing physician.  

## 2020-05-02 NOTE — Progress Notes (Signed)
Pt's states no medical or surgical changes since previsit or office visit. 

## 2020-05-02 NOTE — Op Note (Signed)
[place] Patient Name: Samantha King Procedure Date: 05/02/2020 2:05 PM MRN: [id number] Endoscopist: Gerrit Heck , MD Age: 74 Referring MD:  Date of Birth: [birth year] Gender: Female Account #: [id number] Procedure:                Upper GI endoscopy Indications:              Acute post hemorrhagic anemia, Follow-up of                            duodenal ulcer, Follow-up of gastritis                           Admission in 03/2020 with melena and ABLA. EGD with                            mild gastritis, small non-bleeding duodenal ulcer                            in duodenal bulb, and a single, bleeding visible                            vessel in the duodenal bulb, treated with clips x4                            adn Epinephrine, with cessation of bleeding. Hgb                            nadir 8.1, and was 12 on repeat earlier this week.                            No further overt bleeding. Medicines:                Monitored Anesthesia Care Procedure:                Pre-Anesthesia Assessment:                           - Prior to the procedure, a History and Physical                            was performed, and patient medications and                            allergies were reviewed. The patient's tolerance of                            previous anesthesia was also reviewed. The risks                            and benefits of the procedure and the sedation                            options and risks were discussed with the patient.  All questions were answered, and informed consent                            was obtained. Prior Anticoagulants: The patient has                            taken Plavix (clopidogrel), last dose was 5 days                            prior to procedure. ASA Grade Assessment: III - A                            patient with severe systemic disease. After                            reviewing the risks and  benefits, the patient was                            deemed in satisfactory condition to undergo the                            procedure.                           After obtaining informed consent, the endoscope was                            passed under direct vision. Throughout the                            procedure, the patient's blood pressure, pulse, and                            oxygen saturations were monitored continuously. The                            Endoscope was introduced through the mouth, and                            advanced to the second part of duodenum. The upper                            GI endoscopy was accomplished without difficulty.                            The patient tolerated the procedure well. Scope In: Scope Out: Findings:                 Mildly severe esophagitis (mucosal edema, erythema)                            with no bleeding was found at the gastroesophageal  junction. Biopsies were taken with a cold forceps                            for histology. Estimated blood loss was minimal.                           The upper third of the esophagus and middle third                            of the esophagus were normal.                           Moderate inflammation characterized by congestion                            (edema) and erythema was found in the gastric                            antrum. There was luminal/mucosal deformity, with                            apperance suspicious for rpeviously-healed ulcer,                            although no ulcer in this area noted at the time of                            previous EGD. Biopsies were taken with a cold                            forceps for histology. Estimated blood loss was                            minimal.                           Mild inflammation characterized by congestion                            (edema) and erythema was found in the gastric                             fundus and in the gastric body. Biopsies were taken                            with a cold forceps for histology. Estimated blood                            loss was minimal.                           Scattered moderate inflammation characterized by  erythema and shallow ulcerations was found in the                            duodenal bulb, in the first portion of the duodenum                            and in the second portion of the duodenum. This was                            all new from the previous endoscopy. Biopsies were                            taken with a cold forceps for histology. Estimated                            blood loss was minimal.                           A clip was found in the duodenal bulb. Complications:            No immediate complications. Estimated Blood Loss:     Estimated blood loss was minimal. Impression:               - Mildly severe esophagitis with no bleeding.                            Biopsied.                           - Normal upper third of esophagus and middle third                            of esophagus.                           - Moderately severe gastritis in the gastric                            antrum, worse appearing than the previous EGD.                            Biopsied.                           - Duodenitis with several shallow ulcers. This is                            new compared with the previous study. Biopsied.                           - Clip in the duodenal body. Recommendation:           - Patient has a contact number available for                            emergencies. The  signs and symptoms of potential                            delayed complications were discussed with the                            patient. Return to normal activities tomorrow.                            Written discharge instructions were provided to the                            patient.                            - Resume previous diet.                           - Continue present medications.                           - Await pathology results.                           - Start sucralfate suspension 1 gram PO QID for 4                            weeks.                           - Resume Protonix 40 mg twice daily.                           - Start Pepcid (famotidine) 20 mg PO BID for 8                            weeks.                           - Return to GI clinic in 4 weeks.                           - Repeat upper endoscopy in 8 weeks to check                            healing.                           - If persistent gastritis/duodenitis, and if                            biopsies otherwise unrevealing, plan for cross                            sectional imaging (ie, CT). Gerrit Heck, MD 05/02/2020 2:44:23 PM

## 2020-05-02 NOTE — Patient Instructions (Signed)
Handouts:  Gastritis, esophagitis Resume previous diet Continue present medications Await pathology results Start sucralfate suspension 1 gram four times daily for 4 weeks Resume protonix 40 mg two time daily  Start pepcid 20mg  po twice daily for 8 weeks Repeat upper endoscopy in 8 weeks to check healing     YOU HAD AN ENDOSCOPIC PROCEDURE TODAY AT [facility]:   Refer to the procedure report that was given to you for any specific questions about what was found during the examination.  If the procedure report does not answer your questions, please call your gastroenterologist to clarify.  If you requested that your care partner not be given the details of your procedure findings, then the procedure report has been included in a sealed envelope for you to review at your convenience later.  YOU SHOULD EXPECT: Some feelings of bloating in the abdomen. Passage of more gas than usual.  Walking can help get rid of the air that was put into your GI tract during the procedure and reduce the bloating. If you had a lower endoscopy (such as a colonoscopy or flexible sigmoidoscopy) you may notice spotting of blood in your stool or on the toilet paper. If you underwent a bowel prep for your procedure, you may not have a normal bowel movement for a few days.  Please Note:  You might notice some irritation and congestion in your nose or some drainage.  This is from the oxygen used during your procedure.  There is no need for concern and it should clear up in a day or so.  SYMPTOMS TO REPORT IMMEDIATELY:    Following upper endoscopy (EGD)  Vomiting of blood or coffee ground material  New chest pain or pain under the shoulder blades  Painful or persistently difficult swallowing  New shortness of breath  Fever of 100F or higher  Black, tarry-looking stools  For urgent or emergent issues, a gastroenterologist can be reached at any hour by calling [phone]. Do not use MyChart  messaging for urgent concerns.    DIET:  We do recommend a small meal at first, but then you may proceed to your regular diet.  Drink plenty of fluids but you should avoid alcoholic beverages for 24 hours.  ACTIVITY:  You should plan to take it easy for the rest of today and you should NOT DRIVE or use heavy machinery until tomorrow (because of the sedation medicines used during the test).    FOLLOW UP: Our staff will call the number listed on your records 48-72 hours following your procedure to check on you and address any questions or concerns that you may have regarding the information given to you following your procedure. If we do not reach you, we will leave a message.  We will attempt to reach you two times.  During this call, we will ask if you have developed any symptoms of COVID 19. If you develop any symptoms (ie: fever, flu-like symptoms, shortness of breath, cough etc.) before then, please call [phone].  If you test positive for Covid 19 in the 2 weeks post procedure, please call and report this information to [place].    If any biopsies were taken you will be contacted by phone or by letter within the next 1-3 weeks.  Please call us at [phone] if you have not heard about the biopsies in 3 weeks.    SIGNATURES/CONFIDENTIALITY: You and/or your care partner have signed paperwork which will be entered into your electronic  medical record.  These signatures attest to the fact that that the information above on your After Visit Summary has been reviewed and is understood.  Full responsibility of the confidentiality of this discharge information lies with you and/or your care-partner.

## 2020-05-02 NOTE — Telephone Encounter (Signed)
*  STAT* If patient is at the pharmacy, call can be transferred to refill team.   1. Which medications need to be refilled? (please list name of each medication and dose if known) clopidogrel (PLAVIX) 75 MG tablet  2. Which pharmacy/location (including street and city if local pharmacy) is medication to be sent to? [facility] [place], [place]  3. Do they need a 30 day or 90 day supply? 90 day supply   Patient states she held medication since 04/26/20 for endoscopy procedure completed today, 05/02/20. However, she states when she got home from procedure she was not able to find medication. Due to completely misplacing medication, she is requesting new order. Please assist.

## 2020-05-02 NOTE — Progress Notes (Signed)
VO physician aware of all meds.  To PACU, VSS Report to [email or web address]

## 2020-05-03 DIAGNOSIS — Z1212 Encounter for screening for malignant neoplasm of rectum: Secondary | ICD-10-CM | POA: Diagnosis not present

## 2020-05-04 ENCOUNTER — Telehealth: Payer: Self-pay

## 2020-05-04 NOTE — Telephone Encounter (Signed)
  Follow up Call-  Call back number 05/02/2020  Post procedure Call Back phone  # [phone]  Permission to leave phone message Yes  Some recent data might be hidden     Patient questions:  Do you have a fever, pain , or abdominal swelling? No. Pain Score  0 *  Have you tolerated food without any problems? Yes.    Have you been able to return to your normal activities? Yes.    Do you have any questions about your discharge instructions: Diet   No. Medications  No. Follow up visit  No.  Do you have questions or concerns about your Care? No.  Actions: * If pain score is 4 or above: No action needed, pain <4.  1. Have you developed a fever since your procedure? No  2.   Have you had an respiratory symptoms (SOB or cough) since your procedure? No 3.   Have you tested positive for COVID 19 since your procedure No 4.   Have you had any family members/close contacts diagnosed with the COVID 19 since your procedure?  No  If yes to any of these questions please route to Joylene John, RN and Erenest Rasher, RN

## 2020-05-15 ENCOUNTER — Encounter: Payer: Self-pay | Admitting: Gastroenterology

## 2020-06-14 ENCOUNTER — Telehealth: Payer: Self-pay | Admitting: Gastroenterology

## 2020-06-14 ENCOUNTER — Other Ambulatory Visit: Payer: Self-pay | Admitting: Gastroenterology

## 2020-06-14 ENCOUNTER — Telehealth: Payer: Self-pay

## 2020-06-14 DIAGNOSIS — K2970 Gastritis, unspecified, without bleeding: Secondary | ICD-10-CM

## 2020-06-14 DIAGNOSIS — K2990 Gastroduodenitis, unspecified, without bleeding: Secondary | ICD-10-CM

## 2020-06-14 NOTE — Telephone Encounter (Signed)
Spoke to patient 8 week EGD scheduled in [place] to check for healing. [place] letter sent to cardiology for Plavix hold. Instructions mailed to patient. All questions answered. Patient voiced understanding.

## 2020-06-14 NOTE — Telephone Encounter (Signed)
[facility] Pre-operative Risk Assessment     Request for surgical clearance:     Endoscopy Procedure  What type of surgery is being performed?     EGD  When is this surgery scheduled?     06/28/20  What type of clearance is required ?   Pharmacy  Are there any medications that need to be held prior to surgery and how long? Plavix 5 days  Practice name and name of physician performing surgery?      [facility]  What is your office phone and fax number?      Phone- [phone]  [phone]  Anesthesia type (None, local, MAC, general) ?       MAC

## 2020-06-14 NOTE — Telephone Encounter (Signed)
Samantha King. Samantha King 74 year old female would like to have EGD procedure done.  She was last seen in the office on 03/23/2020.  She was status post GI bleed.  She required hemostatic clips and epinephrine injection.  Her Plavix was held for 7 days and then resume.  On follow-up she was doing well and denied anginal symptoms.  May we hold her Plavix for her upcoming procedure?  Her PMH includes CAD status post STEMI 12/13 C/B V. fib arrest and shock PCI with DES to RCA, echocardiogram 2/18 showed EF 60-65%, LVH, G1 DD, H1N1 influenza, HTN, HLD, peptic ulcer disease, GERD, UGI bleed 5/21 due to dieulafoy lesion.  Please direct your response to CV DIV preop for.  Thank you for your help.  Jossie Ng. Singleton Hickox NP-C    06/14/2020, 3:13 PM [facility] [address] Office [phone] Fax [phone]

## 2020-06-14 NOTE — Telephone Encounter (Signed)
Pt is requesting a call back from [place]

## 2020-06-15 NOTE — Telephone Encounter (Signed)
   Primary Cardiologist: Lauree Chandler, MD  Chart reviewed as part of pre-operative protocol coverage. Given past medical history and time since last visit, based on ACC/AHA guidelines, Samantha King would be at acceptable risk for the planned procedure without further cardiovascular testing.   She may hold her Plavix for 5 days prior to her procedure.  Please resume as soon as hemostasis is achieved.  I will route this recommendation to the requesting party via Epic fax function and remove from pre-op pool.  Please call with questions.  Jossie Ng. Mirriam Vadala NP-C    06/15/2020, 8:50 AM [address] Office [phone] Fax [phone]

## 2020-06-15 NOTE — Telephone Encounter (Signed)
Patient notified that she has been cleared by Dr Angelena Form to hold her Plavix for 5 days prior to her 06/28/20 procedure. Patient voiced understanding.

## 2020-06-15 NOTE — Telephone Encounter (Signed)
OK to hold Plavix.   Samantha King  

## 2020-06-28 ENCOUNTER — Encounter: Payer: Self-pay | Admitting: Gastroenterology

## 2020-06-28 ENCOUNTER — Other Ambulatory Visit: Payer: Self-pay

## 2020-06-28 ENCOUNTER — Ambulatory Visit (AMBULATORY_SURGERY_CENTER): Payer: Medicare Other | Admitting: Gastroenterology

## 2020-06-28 VITALS — BP 98/54 | HR 71 | Temp 97.7°F | Resp 15 | Ht 63.0 in | Wt 175.0 lb

## 2020-06-28 DIAGNOSIS — K319 Disease of stomach and duodenum, unspecified: Secondary | ICD-10-CM | POA: Diagnosis not present

## 2020-06-28 DIAGNOSIS — K2970 Gastritis, unspecified, without bleeding: Secondary | ICD-10-CM | POA: Diagnosis not present

## 2020-06-28 DIAGNOSIS — K219 Gastro-esophageal reflux disease without esophagitis: Secondary | ICD-10-CM | POA: Diagnosis not present

## 2020-06-28 DIAGNOSIS — K209 Esophagitis, unspecified without bleeding: Secondary | ICD-10-CM

## 2020-06-28 DIAGNOSIS — K3189 Other diseases of stomach and duodenum: Secondary | ICD-10-CM | POA: Diagnosis not present

## 2020-06-28 DIAGNOSIS — K2980 Duodenitis without bleeding: Secondary | ICD-10-CM

## 2020-06-28 MED ORDER — SODIUM CHLORIDE 0.9 % IV SOLN: 500.0000 mL | Freq: Once | INTRAVENOUS | Status: DC

## 2020-06-28 NOTE — Progress Notes (Signed)
Lidocaine buffer  robinol antisialogogue 

## 2020-06-28 NOTE — Op Note (Signed)
[facility] Patient Name: Samantha King Procedure Date: 06/28/2020 10:40 AM MRN: [id number] Endoscopist: Gerrit Heck , MD Age: 74 Referring MD:  Date of Birth: 1946/09/02 Gender: Female Account #: [id number] Procedure:                Upper GI endoscopy Indications:              Follow-up of duodenal ulcer, Follow-up of                            duodenitis, Follow-up of gastritis                           Admission in 03/2020 with melena and ABLA. EGD with                            mild gastritis, small non-bleeding                           duodenal ulcer in duodenal bulb, and a single,                            bleeding visible vessel in the duodenal bulb,                           treated with clips x4 and Epinephrine, with                            cessation of bleeding. Hgb nadir 8.1, and was 12                           on repeat in June 2021. Repeat EGD 05/02/20 with                            mild esophagitis, non-H pylori gastritis, and                            duodenitis with shallow duodenal ulcers. Biopsies                            were unremarkable. Treated with continued high dose                            PPI, Pepcid, and Carafate, and presents today to                            evaluate for mucosal healing. No further overt                            bleeding. Medicines:                Monitored Anesthesia Care Procedure:                Pre-Anesthesia Assessment:                           -  Prior to the procedure, a History and Physical                            was performed, and patient medications and                            allergies were reviewed. The patient's tolerance of                            previous anesthesia was also reviewed. The risks                            and benefits of the procedure and the sedation                            options and risks were discussed with the patient.                            All  questions were answered, and informed consent                            was obtained. Prior Anticoagulants: The patient has                            taken no previous anticoagulant or antiplatelet                            agents. ASA Grade Assessment: II - A patient with                            mild systemic disease. After reviewing the risks                            and benefits, the patient was deemed in                            satisfactory condition to undergo the procedure.                           After obtaining informed consent, the endoscope was                            passed under direct vision. Throughout the                            procedure, the patient's blood pressure, pulse, and                            oxygen saturations were monitored continuously. The                            Endoscope was introduced through the mouth, and  advanced to the second part of duodenum. The upper                            GI endoscopy was accomplished without difficulty.                            The patient tolerated the procedure well. Scope In: Scope Out: Findings:                 The examined esophagus was normal.                           The gastroesophageal flap valve was visualized                            endoscopically and classified as Hill Grade III                            (minimal fold, loose to endoscope, hiatal hernia                            likely).                           Diffuse mild to moderate inflammation characterized                            by congestion (edema) and erythema was found in the                            gastric body and in the gastric antrum. Biopsies                            were taken with a cold forceps for histology.                            Estimated blood loss was minimal.                           Patchy mildly erythematous mucosa without active                            bleeding  and with no stigmata of bleeding was found                            in the duodenal bulb and in the second portion of                            the duodenum. This was improved from the previous                            study and the previously noted duodenal uclers have  since healed. Complications:            No immediate complications. Estimated Blood Loss:     Estimated blood loss was minimal. Impression:               - Normal esophagus.                           - Gastroesophageal flap valve classified as Hill                            Grade III (minimal fold, loose to endoscope, hiatal                            hernia likely).                           - Gastritis. Biopsied.                           - Erythematous duodenopathy. Recommendation:           - Patient has a contact number available for                            emergencies. The signs and symptoms of potential                            delayed complications were discussed with the                            patient. Return to normal activities tomorrow.                            Written discharge instructions were provided to the                            patient.                           - Resume previous diet.                           - Continue present medications.                           - Await pathology results.                           - Return to GI clinic at appointment to be                            scheduled.                           - If pathology again unrevealing, can consider                            cross sectional imaging (ie,  CT abdomen). Will plan                            to discuss at follow-up appointment based on                            pathology results.                           - Resume pantoprazole at current dose for another 4                            weeks, then can reduce to daily dosing. Gerrit Heck, MD 06/28/2020 11:10:43 AM

## 2020-06-28 NOTE — Progress Notes (Signed)
Called to room to assist during endoscopic procedure.  Patient ID and intended procedure confirmed with present staff. Received instructions for my participation in the procedure from the performing physician.  

## 2020-06-28 NOTE — Patient Instructions (Signed)
Handout given for gastritis.  Resume all meds today per Dr Bryan Lemma including Plavix.  YOU HAD AN ENDOSCOPIC PROCEDURE TODAY AT [facility]:   Refer to the procedure report that was given to you for any specific questions about what was found during the examination.  If the procedure report does not answer your questions, please call your gastroenterologist to clarify.  If you requested that your care partner not be given the details of your procedure findings, then the procedure report has been included in a sealed envelope for you to review at your convenience later.  YOU SHOULD EXPECT: Some feelings of bloating in the abdomen. Passage of more gas than usual.  Walking can help get rid of the air that was put into your GI tract during the procedure and reduce the bloating. If you had a lower endoscopy (such as a colonoscopy or flexible sigmoidoscopy) you may notice spotting of blood in your stool or on the toilet paper. If you underwent a bowel prep for your procedure, you may not have a normal bowel movement for a few days.  Please Note:  You might notice some irritation and congestion in your nose or some drainage.  This is from the oxygen used during your procedure.  There is no need for concern and it should clear up in a day or so.  SYMPTOMS TO REPORT IMMEDIATELY:   Following upper endoscopy (EGD)  Vomiting of blood or coffee ground material  New chest pain or pain under the shoulder blades  Painful or persistently difficult swallowing  New shortness of breath  Fever of 100F or higher  Black, tarry-looking stools  For urgent or emergent issues, a gastroenterologist can be reached at any hour by calling [phone]. Do not use MyChart messaging for urgent concerns.    DIET:  We do recommend a small meal at first, but then you may proceed to your regular diet.  Drink plenty of fluids but you should avoid alcoholic beverages for 24 hours.  ACTIVITY:  You should  plan to take it easy for the rest of today and you should NOT DRIVE or use heavy machinery until tomorrow (because of the sedation medicines used during the test).    FOLLOW UP: Our staff will call the number listed on your records 48-72 hours following your procedure to check on you and address any questions or concerns that you may have regarding the information given to you following your procedure. If we do not reach you, we will leave a message.  We will attempt to reach you two times.  During this call, we will ask if you have developed any symptoms of COVID 19. If you develop any symptoms (ie: fever, flu-like symptoms, shortness of breath, cough etc.) before then, please call [phone].  If you test positive for Covid 19 in the 2 weeks post procedure, please call and report this information to [place].    If any biopsies were taken you will be contacted by phone or by letter within the next 1-3 weeks.  Please call us at [phone] if you have not heard about the biopsies in 3 weeks.    SIGNATURES/CONFIDENTIALITY: You and/or your care partner have signed paperwork which will be entered into your electronic medical record.  These signatures attest to the fact that that the information above on your After Visit Summary has been reviewed and is understood.  Full responsibility of the confidentiality of this discharge information lies with you and/or your  care-partner.

## 2020-06-28 NOTE — Progress Notes (Signed)
A and O x3. Report to RN. Tolerated MAC anesthesia well.Teeth unchanged after procedure.

## 2020-06-28 NOTE — Progress Notes (Signed)
Reviewed patients history. Patient vaccinated. VS:Samantha King

## 2020-07-02 ENCOUNTER — Telehealth: Payer: Self-pay | Admitting: *Deleted

## 2020-07-02 ENCOUNTER — Encounter: Payer: Self-pay | Admitting: Gastroenterology

## 2020-07-02 NOTE — Telephone Encounter (Signed)
  Follow up Call-  Call back number 06/28/2020 05/02/2020  Post procedure Call Back phone  # [phone]  Permission to leave phone message Yes Yes  Some recent data might be hidden     Patient questions:  Do you have a fever, pain , or abdominal swelling? No. Pain Score  0 *  Have you tolerated food without any problems? Yes.    Have you been able to return to your normal activities? Yes.    Do you have any questions about your discharge instructions: Diet   No. Medications  No. Follow up visit  No.  Do you have questions or concerns about your Care? No.  Actions: * If pain score is 4 or above: No action needed, pain <4.  1. Have you developed a fever since your procedure? no  2.   Have you had an respiratory symptoms (SOB or cough) since your procedure? no  3.   Have you tested positive for COVID 19 since your procedure no  4.   Have you had any family members/close contacts diagnosed with the COVID 19 since your procedure?  no   If yes to any of these questions please route to Joylene John, RN and Joella Prince, RN

## 2020-07-06 ENCOUNTER — Ambulatory Visit: Payer: Medicare Other | Admitting: Gastroenterology

## 2020-08-10 ENCOUNTER — Other Ambulatory Visit: Payer: Self-pay | Admitting: Internal Medicine

## 2020-08-10 DIAGNOSIS — E611 Iron deficiency: Secondary | ICD-10-CM

## 2020-08-10 DIAGNOSIS — M810 Age-related osteoporosis without current pathological fracture: Secondary | ICD-10-CM

## 2020-08-11 DIAGNOSIS — Z23 Encounter for immunization: Secondary | ICD-10-CM | POA: Diagnosis not present

## 2020-08-15 ENCOUNTER — Ambulatory Visit (INDEPENDENT_AMBULATORY_CARE_PROVIDER_SITE_OTHER): Payer: Medicare Other | Admitting: Gastroenterology

## 2020-08-15 ENCOUNTER — Encounter: Payer: Self-pay | Admitting: Gastroenterology

## 2020-08-15 VITALS — BP 122/84 | HR 83 | Ht 63.0 in | Wt 175.0 lb

## 2020-08-15 DIAGNOSIS — K219 Gastro-esophageal reflux disease without esophagitis: Secondary | ICD-10-CM | POA: Diagnosis not present

## 2020-08-15 DIAGNOSIS — K2980 Duodenitis without bleeding: Secondary | ICD-10-CM | POA: Diagnosis not present

## 2020-08-15 DIAGNOSIS — K2970 Gastritis, unspecified, without bleeding: Secondary | ICD-10-CM

## 2020-08-15 DIAGNOSIS — K3182 Dieulafoy lesion (hemorrhagic) of stomach and duodenum: Secondary | ICD-10-CM | POA: Diagnosis not present

## 2020-08-15 DIAGNOSIS — K2990 Gastroduodenitis, unspecified, without bleeding: Secondary | ICD-10-CM | POA: Diagnosis not present

## 2020-08-15 NOTE — Patient Instructions (Addendum)
If you are age 76 or older, your body mass index should be between 23-30. Your Body mass index is 31 kg/m. If this is out of the aforementioned range listed, please consider follow up with your Primary Care Provider.  If you are age 26 or younger, your body mass index should be between 19-25. Your Body mass index is 31 kg/m. If this is out of the aformentioned range listed, please consider follow up with your Primary Care Provider.   STOP Pepcid.  Decrease Protonix to 20 mg daily for  7 days then STOP. Gastrin level 2 weeks later.  Your provider has requested that you go to the basement level at [facility] for lab work 2 weeks after finishing Protnix. Press "B" on the elevator. The lab is located at the first door on the left as you exit the elevator.  YOU NEED TO BE FASTING FOR THIS LAB - nothing to eat or drink after midnight the night before.  Okay to use Tums.  It was a pleasure to see you today!  Vito Cirigliano, D.O.
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
## 2020-08-16 DIAGNOSIS — H02831 Dermatochalasis of right upper eyelid: Secondary | ICD-10-CM | POA: Diagnosis not present

## 2020-08-16 DIAGNOSIS — H02834 Dermatochalasis of left upper eyelid: Secondary | ICD-10-CM | POA: Diagnosis not present

## 2020-08-29 ENCOUNTER — Telehealth: Payer: Self-pay | Admitting: Gastroenterology

## 2020-08-29 NOTE — Telephone Encounter (Signed)
Pt would like to scheduled a CT scan. She stated that Dr. Loletha Grayer told her that he could order one if she was still feeling bad after stopping protonix. Pt stated that she stopped protonix for 4 days and her sxs got worse so she had to go back to taking it.

## 2020-08-29 NOTE — Telephone Encounter (Signed)
Pt does not wish to be called back tomorrow since she is busy all day, pt can be called back after tomorrow

## 2020-08-31 NOTE — Telephone Encounter (Signed)
Spoke to patient this morning who reports restarting her Protonix 40 mg daily. She was advised at her last office visit to titrate down to 20 mg for 7 days then off completely. Patient states she was miserable after 5 days of 20 mg that she went back to taking Protonix 40 mg. She states that she feels much better and does not want to do anything different at this time,Imaging or labs. Patient said she will call the office if anything changes.

## 2020-08-31 NOTE — Telephone Encounter (Signed)
Thank you for the update.  We will plan on continuing Protonix at 40 mg/day given good therapeutic response.

## 2020-09-03 ENCOUNTER — Telehealth: Payer: Self-pay | Admitting: Cardiovascular Disease

## 2020-09-03 MED ORDER — NITROGLYCERIN 0.4 MG SL SUBL: SUBLINGUAL_TABLET | SUBLINGUAL | 1 refills | Status: DC

## 2020-09-03 NOTE — Telephone Encounter (Signed)
*  STAT* If patient is at the pharmacy, call can be transferred to refill team.   1. Which medications need to be refilled? (please list name of each medication and dose if known) nitroGLYCERIN (NITROSTAT) 0.4 MG SL tablet  2. Which pharmacy/location (including street and city if local pharmacy) is medication to be sent to? [place], [place]  3. Do they need a 30 day or 90 day supply? [place]

## 2020-09-03 NOTE — Telephone Encounter (Signed)
Pt's medication was sent to pt's pharmacy as requested. Confirmation received.  °

## 2020-09-05 DIAGNOSIS — Z23 Encounter for immunization: Secondary | ICD-10-CM | POA: Diagnosis not present

## 2020-09-11 DIAGNOSIS — Z961 Presence of intraocular lens: Secondary | ICD-10-CM | POA: Diagnosis not present

## 2020-09-11 DIAGNOSIS — H35341 Macular cyst, hole, or pseudohole, right eye: Secondary | ICD-10-CM | POA: Diagnosis not present

## 2020-11-14 DIAGNOSIS — Z1152 Encounter for screening for COVID-19: Secondary | ICD-10-CM | POA: Diagnosis not present

## 2020-11-14 DIAGNOSIS — U071 COVID-19: Secondary | ICD-10-CM | POA: Diagnosis not present

## 2020-11-14 DIAGNOSIS — J3489 Other specified disorders of nose and nasal sinuses: Secondary | ICD-10-CM | POA: Diagnosis not present

## 2020-11-14 DIAGNOSIS — R059 Cough, unspecified: Secondary | ICD-10-CM | POA: Diagnosis not present

## 2020-12-04 ENCOUNTER — Other Ambulatory Visit: Payer: Medicare Other

## 2020-12-05 DIAGNOSIS — L600 Ingrowing nail: Secondary | ICD-10-CM | POA: Diagnosis not present

## 2020-12-05 DIAGNOSIS — L03032 Cellulitis of left toe: Secondary | ICD-10-CM | POA: Diagnosis not present

## 2020-12-07 ENCOUNTER — Other Ambulatory Visit: Payer: Self-pay

## 2020-12-07 ENCOUNTER — Telehealth: Payer: Self-pay

## 2020-12-07 NOTE — Telephone Encounter (Signed)
Pt called requesting a refill on Pantoprazole 40mg . Is it ok to fill this under Dr Angelena Form or should it go to PCP?

## 2020-12-10 MED ORDER — PANTOPRAZOLE SODIUM 40 MG PO TBEC: 40.0000 mg | DELAYED_RELEASE_TABLET | Freq: Every day | ORAL | 1 refills | Status: DC

## 2020-12-10 NOTE — Telephone Encounter (Signed)
Pantoprazole refill sent

## 2020-12-11 DIAGNOSIS — M810 Age-related osteoporosis without current pathological fracture: Secondary | ICD-10-CM | POA: Diagnosis not present

## 2020-12-11 DIAGNOSIS — M25522 Pain in left elbow: Secondary | ICD-10-CM | POA: Diagnosis not present

## 2020-12-12 DIAGNOSIS — J302 Other seasonal allergic rhinitis: Secondary | ICD-10-CM | POA: Diagnosis not present

## 2020-12-12 DIAGNOSIS — I2510 Atherosclerotic heart disease of native coronary artery without angina pectoris: Secondary | ICD-10-CM | POA: Diagnosis not present

## 2020-12-12 DIAGNOSIS — I119 Hypertensive heart disease without heart failure: Secondary | ICD-10-CM | POA: Diagnosis not present

## 2020-12-12 DIAGNOSIS — Z8616 Personal history of COVID-19: Secondary | ICD-10-CM | POA: Diagnosis not present

## 2020-12-12 DIAGNOSIS — E669 Obesity, unspecified: Secondary | ICD-10-CM | POA: Diagnosis not present

## 2020-12-12 DIAGNOSIS — L600 Ingrowing nail: Secondary | ICD-10-CM | POA: Diagnosis not present

## 2020-12-12 DIAGNOSIS — M810 Age-related osteoporosis without current pathological fracture: Secondary | ICD-10-CM | POA: Diagnosis not present

## 2020-12-12 DIAGNOSIS — F3341 Major depressive disorder, recurrent, in partial remission: Secondary | ICD-10-CM | POA: Diagnosis not present

## 2020-12-12 DIAGNOSIS — E7800 Pure hypercholesterolemia, unspecified: Secondary | ICD-10-CM | POA: Diagnosis not present

## 2020-12-12 DIAGNOSIS — J45909 Unspecified asthma, uncomplicated: Secondary | ICD-10-CM | POA: Diagnosis not present

## 2021-01-04 ENCOUNTER — Encounter: Payer: Self-pay | Admitting: Cardiovascular Disease

## 2021-01-04 ENCOUNTER — Other Ambulatory Visit: Payer: Self-pay

## 2021-01-04 ENCOUNTER — Ambulatory Visit (INDEPENDENT_AMBULATORY_CARE_PROVIDER_SITE_OTHER): Payer: Medicare Other | Admitting: Cardiovascular Disease

## 2021-01-04 VITALS — BP 168/82 | HR 85 | Ht 63.0 in | Wt 173.0 lb

## 2021-01-04 DIAGNOSIS — I10 Essential (primary) hypertension: Secondary | ICD-10-CM

## 2021-01-04 DIAGNOSIS — E782 Mixed hyperlipidemia: Secondary | ICD-10-CM | POA: Diagnosis not present

## 2021-01-04 DIAGNOSIS — I2510 Atherosclerotic heart disease of native coronary artery without angina pectoris: Secondary | ICD-10-CM

## 2021-01-04 NOTE — Patient Instructions (Signed)
Medication Instructions:  No changes *If you need a refill on your cardiac medications before your next appointment, please call your pharmacy*   Lab Work: none If you have labs (blood work) drawn today and your tests are completely normal, you will receive your results only by: . MyChart Message (if you have MyChart) OR . A paper copy in the mail If you have any lab test that is abnormal or we need to change your treatment, we will call you to review the results.   Testing/Procedures: Your physician has requested that you have an echocardiogram. Echocardiography is a painless test that uses sound waves to create images of your heart. It provides your doctor with information about the size and shape of your heart and how well your heart's chambers and valves are working. This procedure takes approximately one hour. There are no restrictions for this procedure.   Follow-Up: At [facility], you and your health needs are our priority.  As part of our continuing mission to provide you with exceptional heart care, we have created designated Provider Care Teams.  These Care Teams include your primary Cardiologist (physician) and Advanced Practice Providers (APPs -  Physician Assistants and Nurse Practitioners) who all work together to provide you with the care you need, when you need it.  We recommend signing up for the patient portal called "MyChart".  Sign up information is provided on this After Visit Summary.  MyChart is used to connect with patients for Virtual Visits (Telemedicine).  Patients are able to view lab/test results, encounter notes, upcoming appointments, etc.  Non-urgent messages can be sent to your provider as well.   To learn more about what you can do with MyChart, go to [email or web address].    Your next appointment:   12 month(s)  The format for your next appointment:   In Person  Provider:   You may see Amond Speranza McAlhany, MD or one of the following Advanced  Practice Providers on your designated Care Team:    Dayna Dunn, PA-C  Michele Lenze, PA-C    Other Instructions   
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
## 2021-01-08 DIAGNOSIS — I119 Hypertensive heart disease without heart failure: Secondary | ICD-10-CM | POA: Diagnosis not present

## 2021-01-09 DIAGNOSIS — R195 Other fecal abnormalities: Secondary | ICD-10-CM | POA: Diagnosis not present

## 2021-01-09 DIAGNOSIS — E7800 Pure hypercholesterolemia, unspecified: Secondary | ICD-10-CM | POA: Diagnosis not present

## 2021-01-09 DIAGNOSIS — I119 Hypertensive heart disease without heart failure: Secondary | ICD-10-CM | POA: Diagnosis not present

## 2021-01-17 ENCOUNTER — Ambulatory Visit: Payer: Medicare Other | Admitting: Gastroenterology

## 2021-01-29 ENCOUNTER — Other Ambulatory Visit: Payer: Self-pay

## 2021-01-29 ENCOUNTER — Ambulatory Visit (HOSPITAL_COMMUNITY): Payer: Medicare Other | Attending: Cardiovascular Disease

## 2021-01-29 DIAGNOSIS — I2510 Atherosclerotic heart disease of native coronary artery without angina pectoris: Secondary | ICD-10-CM | POA: Diagnosis not present

## 2021-01-29 DIAGNOSIS — I10 Essential (primary) hypertension: Secondary | ICD-10-CM

## 2021-01-29 DIAGNOSIS — E782 Mixed hyperlipidemia: Secondary | ICD-10-CM | POA: Diagnosis not present

## 2021-01-29 LAB — ECHOCARDIOGRAM COMPLETE
Area-P 1/2: 3.65 cm2
MV VTI: 2.28 cm2
P 1/2 time: 515 msec
S' Lateral: 2.75 cm

## 2021-02-05 ENCOUNTER — Encounter (HOSPITAL_COMMUNITY): Payer: Medicare Other

## 2021-02-05 MED ORDER — MIDAZOLAM HCL 2 MG/2ML IJ SOLN
INTRAMUSCULAR | Status: AC
  Filled 2021-02-05: qty 2

## 2021-02-05 MED ORDER — FENTANYL CITRATE (PF) 100 MCG/2ML IJ SOLN
INTRAMUSCULAR | Status: AC
  Filled 2021-02-05: qty 2

## 2021-02-12 ENCOUNTER — Other Ambulatory Visit (HOSPITAL_COMMUNITY): Payer: Self-pay | Admitting: *Deleted

## 2021-02-13 ENCOUNTER — Ambulatory Visit (HOSPITAL_COMMUNITY)
Admission: RE | Admit: 2021-02-13 | Discharge: 2021-02-13 | Disposition: A | Discharge status: Home / Self Care | Payer: Medicare Other | Source: Ambulatory Visit | Attending: Internal Medicine | Admitting: Internal Medicine

## 2021-02-13 ENCOUNTER — Other Ambulatory Visit: Payer: Self-pay

## 2021-02-13 DIAGNOSIS — M810 Age-related osteoporosis without current pathological fracture: Secondary | ICD-10-CM | POA: Diagnosis not present

## 2021-02-13 MED ORDER — DENOSUMAB 60 MG/ML ~~LOC~~ SOSY
60.0000 mg | PREFILLED_SYRINGE | Freq: Once | SUBCUTANEOUS | Status: AC
  Administered 2021-02-13: 60 mg via SUBCUTANEOUS

## 2021-02-13 MED ORDER — DENOSUMAB 60 MG/ML ~~LOC~~ SOSY
PREFILLED_SYRINGE | SUBCUTANEOUS | Status: AC
  Filled 2021-02-13: qty 1

## 2021-03-26 DIAGNOSIS — Z23 Encounter for immunization: Secondary | ICD-10-CM | POA: Diagnosis not present

## 2021-04-04 ENCOUNTER — Telehealth: Payer: Self-pay | Admitting: Cardiovascular Disease

## 2021-04-04 NOTE — Telephone Encounter (Signed)
Handicap application signed by K. Grandville Silos, PA-C and placed at front desk.  Pt notified and will pick up tomorrow.

## 2021-04-04 NOTE — Telephone Encounter (Signed)
Pt would like to know how she can obtain a Handicap Placecard. Please advise

## 2021-05-02 DIAGNOSIS — E7800 Pure hypercholesterolemia, unspecified: Secondary | ICD-10-CM | POA: Diagnosis not present

## 2021-05-02 DIAGNOSIS — M810 Age-related osteoporosis without current pathological fracture: Secondary | ICD-10-CM | POA: Diagnosis not present

## 2021-05-02 DIAGNOSIS — I10 Essential (primary) hypertension: Secondary | ICD-10-CM | POA: Diagnosis not present

## 2021-05-07 DIAGNOSIS — Z8616 Personal history of COVID-19: Secondary | ICD-10-CM | POA: Diagnosis not present

## 2021-05-07 DIAGNOSIS — R82998 Other abnormal findings in urine: Secondary | ICD-10-CM | POA: Diagnosis not present

## 2021-05-07 DIAGNOSIS — I10 Essential (primary) hypertension: Secondary | ICD-10-CM | POA: Diagnosis not present

## 2021-05-07 DIAGNOSIS — Z0000 Encounter for general adult medical examination without abnormal findings: Secondary | ICD-10-CM | POA: Diagnosis not present

## 2021-05-07 DIAGNOSIS — F3341 Major depressive disorder, recurrent, in partial remission: Secondary | ICD-10-CM | POA: Diagnosis not present

## 2021-05-07 DIAGNOSIS — E7800 Pure hypercholesterolemia, unspecified: Secondary | ICD-10-CM | POA: Diagnosis not present

## 2021-05-07 DIAGNOSIS — I2510 Atherosclerotic heart disease of native coronary artery without angina pectoris: Secondary | ICD-10-CM | POA: Diagnosis not present

## 2021-05-07 DIAGNOSIS — J45909 Unspecified asthma, uncomplicated: Secondary | ICD-10-CM | POA: Diagnosis not present

## 2021-05-07 DIAGNOSIS — I119 Hypertensive heart disease without heart failure: Secondary | ICD-10-CM | POA: Diagnosis not present

## 2021-05-07 DIAGNOSIS — E669 Obesity, unspecified: Secondary | ICD-10-CM | POA: Diagnosis not present

## 2021-05-07 DIAGNOSIS — Z1331 Encounter for screening for depression: Secondary | ICD-10-CM | POA: Diagnosis not present

## 2021-05-07 DIAGNOSIS — Z1339 Encounter for screening examination for other mental health and behavioral disorders: Secondary | ICD-10-CM | POA: Diagnosis not present

## 2021-05-07 DIAGNOSIS — J302 Other seasonal allergic rhinitis: Secondary | ICD-10-CM | POA: Diagnosis not present

## 2021-05-07 DIAGNOSIS — M810 Age-related osteoporosis without current pathological fracture: Secondary | ICD-10-CM | POA: Diagnosis not present

## 2021-06-18 ENCOUNTER — Emergency Department (HOSPITAL_COMMUNITY): Payer: Medicare Other

## 2021-06-18 ENCOUNTER — Inpatient Hospital Stay (HOSPITAL_COMMUNITY)
Admission: EM | Admit: 2021-06-18 | Discharge: 2021-06-25 | DRG: 871 | Disposition: A | Discharge status: Other Acute Inpatient Hospital | Payer: Medicare Other | Attending: Internal Medicine | Admitting: Internal Medicine

## 2021-06-18 DIAGNOSIS — Z79899 Other long term (current) drug therapy: Secondary | ICD-10-CM

## 2021-06-18 DIAGNOSIS — Z7902 Long term (current) use of antithrombotics/antiplatelets: Secondary | ICD-10-CM

## 2021-06-18 DIAGNOSIS — I76 Septic arterial embolism: Secondary | ICD-10-CM | POA: Diagnosis not present

## 2021-06-18 DIAGNOSIS — M48061 Spinal stenosis, lumbar region without neurogenic claudication: Secondary | ICD-10-CM | POA: Diagnosis not present

## 2021-06-18 DIAGNOSIS — K828 Other specified diseases of gallbladder: Secondary | ICD-10-CM | POA: Diagnosis not present

## 2021-06-18 DIAGNOSIS — J9601 Acute respiratory failure with hypoxia: Secondary | ICD-10-CM | POA: Diagnosis not present

## 2021-06-18 DIAGNOSIS — B9561 Methicillin susceptible Staphylococcus aureus infection as the cause of diseases classified elsewhere: Secondary | ICD-10-CM

## 2021-06-18 DIAGNOSIS — I480 Paroxysmal atrial fibrillation: Secondary | ICD-10-CM | POA: Diagnosis not present

## 2021-06-18 DIAGNOSIS — Z8669 Personal history of other diseases of the nervous system and sense organs: Secondary | ICD-10-CM | POA: Diagnosis not present

## 2021-06-18 DIAGNOSIS — R7881 Bacteremia: Secondary | ICD-10-CM

## 2021-06-18 DIAGNOSIS — Z8711 Personal history of peptic ulcer disease: Secondary | ICD-10-CM

## 2021-06-18 DIAGNOSIS — J811 Chronic pulmonary edema: Secondary | ICD-10-CM | POA: Diagnosis not present

## 2021-06-18 DIAGNOSIS — J90 Pleural effusion, not elsewhere classified: Secondary | ICD-10-CM | POA: Diagnosis not present

## 2021-06-18 DIAGNOSIS — R6521 Severe sepsis with septic shock: Secondary | ICD-10-CM | POA: Diagnosis present

## 2021-06-18 DIAGNOSIS — Z885 Allergy status to narcotic agent status: Secondary | ICD-10-CM

## 2021-06-18 DIAGNOSIS — M7989 Other specified soft tissue disorders: Secondary | ICD-10-CM | POA: Diagnosis not present

## 2021-06-18 DIAGNOSIS — I2690 Septic pulmonary embolism without acute cor pulmonale: Secondary | ICD-10-CM | POA: Diagnosis present

## 2021-06-18 DIAGNOSIS — A4101 Sepsis due to Methicillin susceptible Staphylococcus aureus: Secondary | ICD-10-CM | POA: Diagnosis not present

## 2021-06-18 DIAGNOSIS — M4316 Spondylolisthesis, lumbar region: Secondary | ICD-10-CM | POA: Diagnosis not present

## 2021-06-18 DIAGNOSIS — F32A Depression, unspecified: Secondary | ICD-10-CM | POA: Diagnosis present

## 2021-06-18 DIAGNOSIS — M62830 Muscle spasm of back: Secondary | ICD-10-CM | POA: Diagnosis present

## 2021-06-18 DIAGNOSIS — Z7951 Long term (current) use of inhaled steroids: Secondary | ICD-10-CM

## 2021-06-18 DIAGNOSIS — K219 Gastro-esophageal reflux disease without esophagitis: Secondary | ICD-10-CM | POA: Diagnosis present

## 2021-06-18 DIAGNOSIS — Z888 Allergy status to other drugs, medicaments and biological substances status: Secondary | ICD-10-CM

## 2021-06-18 DIAGNOSIS — R0600 Dyspnea, unspecified: Secondary | ICD-10-CM | POA: Diagnosis not present

## 2021-06-18 DIAGNOSIS — R0902 Hypoxemia: Secondary | ICD-10-CM | POA: Diagnosis not present

## 2021-06-18 DIAGNOSIS — Z91013 Allergy to seafood: Secondary | ICD-10-CM

## 2021-06-18 DIAGNOSIS — R0602 Shortness of breath: Secondary | ICD-10-CM | POA: Diagnosis not present

## 2021-06-18 DIAGNOSIS — Z8674 Personal history of sudden cardiac arrest: Secondary | ICD-10-CM | POA: Diagnosis not present

## 2021-06-18 DIAGNOSIS — E872 Acidosis: Secondary | ICD-10-CM | POA: Diagnosis present

## 2021-06-18 DIAGNOSIS — J9811 Atelectasis: Secondary | ICD-10-CM | POA: Diagnosis not present

## 2021-06-18 DIAGNOSIS — I2510 Atherosclerotic heart disease of native coronary artery without angina pectoris: Secondary | ICD-10-CM | POA: Diagnosis present

## 2021-06-18 DIAGNOSIS — I130 Hypertensive heart and chronic kidney disease with heart failure and stage 1 through stage 4 chronic kidney disease, or unspecified chronic kidney disease: Secondary | ICD-10-CM | POA: Diagnosis not present

## 2021-06-18 DIAGNOSIS — R918 Other nonspecific abnormal finding of lung field: Secondary | ICD-10-CM | POA: Diagnosis not present

## 2021-06-18 DIAGNOSIS — N200 Calculus of kidney: Secondary | ICD-10-CM | POA: Diagnosis present

## 2021-06-18 DIAGNOSIS — D6489 Other specified anemias: Secondary | ICD-10-CM | POA: Diagnosis present

## 2021-06-18 DIAGNOSIS — G8929 Other chronic pain: Secondary | ICD-10-CM | POA: Diagnosis present

## 2021-06-18 DIAGNOSIS — I5032 Chronic diastolic (congestive) heart failure: Secondary | ICD-10-CM | POA: Diagnosis not present

## 2021-06-18 DIAGNOSIS — E279 Disorder of adrenal gland, unspecified: Secondary | ICD-10-CM | POA: Diagnosis not present

## 2021-06-18 DIAGNOSIS — M5135 Other intervertebral disc degeneration, thoracolumbar region: Secondary | ICD-10-CM | POA: Diagnosis not present

## 2021-06-18 DIAGNOSIS — E871 Hypo-osmolality and hyponatremia: Secondary | ICD-10-CM | POA: Diagnosis not present

## 2021-06-18 DIAGNOSIS — J432 Centrilobular emphysema: Secondary | ICD-10-CM | POA: Diagnosis not present

## 2021-06-18 DIAGNOSIS — A419 Sepsis, unspecified organism: Secondary | ICD-10-CM

## 2021-06-18 DIAGNOSIS — J441 Chronic obstructive pulmonary disease with (acute) exacerbation: Secondary | ICD-10-CM | POA: Diagnosis not present

## 2021-06-18 DIAGNOSIS — N189 Chronic kidney disease, unspecified: Secondary | ICD-10-CM | POA: Diagnosis not present

## 2021-06-18 DIAGNOSIS — N179 Acute kidney failure, unspecified: Secondary | ICD-10-CM | POA: Diagnosis present

## 2021-06-18 DIAGNOSIS — Q7649 Other congenital malformations of spine, not associated with scoliosis: Secondary | ICD-10-CM | POA: Diagnosis not present

## 2021-06-18 DIAGNOSIS — I517 Cardiomegaly: Secondary | ICD-10-CM | POA: Diagnosis not present

## 2021-06-18 DIAGNOSIS — M549 Dorsalgia, unspecified: Secondary | ICD-10-CM | POA: Diagnosis not present

## 2021-06-18 DIAGNOSIS — J189 Pneumonia, unspecified organism: Secondary | ICD-10-CM | POA: Diagnosis present

## 2021-06-18 DIAGNOSIS — E876 Hypokalemia: Secondary | ICD-10-CM | POA: Diagnosis not present

## 2021-06-18 DIAGNOSIS — E785 Hyperlipidemia, unspecified: Secondary | ICD-10-CM | POA: Diagnosis present

## 2021-06-18 DIAGNOSIS — M5116 Intervertebral disc disorders with radiculopathy, lumbar region: Secondary | ICD-10-CM | POA: Diagnosis not present

## 2021-06-18 DIAGNOSIS — J439 Emphysema, unspecified: Secondary | ICD-10-CM | POA: Diagnosis present

## 2021-06-18 DIAGNOSIS — M5124 Other intervertebral disc displacement, thoracic region: Secondary | ICD-10-CM | POA: Diagnosis not present

## 2021-06-18 DIAGNOSIS — E8770 Fluid overload, unspecified: Secondary | ICD-10-CM | POA: Diagnosis not present

## 2021-06-18 DIAGNOSIS — R579 Shock, unspecified: Secondary | ICD-10-CM | POA: Diagnosis not present

## 2021-06-18 DIAGNOSIS — Z09 Encounter for follow-up examination after completed treatment for conditions other than malignant neoplasm: Secondary | ICD-10-CM

## 2021-06-18 DIAGNOSIS — M545 Low back pain, unspecified: Secondary | ICD-10-CM

## 2021-06-18 DIAGNOSIS — Z87891 Personal history of nicotine dependence: Secondary | ICD-10-CM

## 2021-06-18 DIAGNOSIS — I493 Ventricular premature depolarization: Secondary | ICD-10-CM | POA: Diagnosis not present

## 2021-06-18 DIAGNOSIS — I252 Old myocardial infarction: Secondary | ICD-10-CM

## 2021-06-18 DIAGNOSIS — Z955 Presence of coronary angioplasty implant and graft: Secondary | ICD-10-CM

## 2021-06-18 DIAGNOSIS — M5136 Other intervertebral disc degeneration, lumbar region: Secondary | ICD-10-CM | POA: Diagnosis not present

## 2021-06-18 DIAGNOSIS — Q433 Congenital malformations of intestinal fixation: Secondary | ICD-10-CM | POA: Diagnosis not present

## 2021-06-18 DIAGNOSIS — D3502 Benign neoplasm of left adrenal gland: Secondary | ICD-10-CM | POA: Diagnosis present

## 2021-06-18 DIAGNOSIS — Z8249 Family history of ischemic heart disease and other diseases of the circulatory system: Secondary | ICD-10-CM

## 2021-06-18 DIAGNOSIS — M419 Scoliosis, unspecified: Secondary | ICD-10-CM | POA: Diagnosis not present

## 2021-06-18 DIAGNOSIS — Z20822 Contact with and (suspected) exposure to covid-19: Secondary | ICD-10-CM | POA: Diagnosis not present

## 2021-06-18 DIAGNOSIS — R000 Tachycardia, unspecified: Secondary | ICD-10-CM | POA: Diagnosis not present

## 2021-06-18 DIAGNOSIS — A4189 Other specified sepsis: Secondary | ICD-10-CM | POA: Diagnosis not present

## 2021-06-18 LAB — LACTIC ACID, PLASMA
Lactic Acid, Venous: 2 mmol/L (ref 0.5–1.9)
Lactic Acid, Venous: 1.5 mmol/L (ref 0.5–1.9)

## 2021-06-18 LAB — CBC WITH DIFFERENTIAL/PLATELET
Abs Immature Granulocytes: 0.23 10*3/uL — ABNORMAL HIGH (ref 0.00–0.07)
Basophils Absolute: 0.1 10*3/uL (ref 0.0–0.1)
Basophils Relative: 0 %
Eosinophils Absolute: 0 10*3/uL (ref 0.0–0.5)
Eosinophils Relative: 0 %
HCT: 43.7 % (ref 36.0–46.0)
Hemoglobin: 13.6 g/dL (ref 12.0–15.0)
Immature Granulocytes: 1 %
Lymphocytes Relative: 3 %
Lymphs Abs: 0.7 10*3/uL (ref 0.7–4.0)
MCH: 28.2 pg (ref 26.0–34.0)
MCHC: 31.1 g/dL (ref 30.0–36.0)
MCV: 90.5 fL (ref 80.0–100.0)
Monocytes Absolute: 2.9 10*3/uL — ABNORMAL HIGH (ref 0.1–1.0)
Monocytes Relative: 12 %
Neutro Abs: 21 10*3/uL — ABNORMAL HIGH (ref 1.7–7.7)
Neutrophils Relative %: 84 %
Platelets: 231 10*3/uL (ref 150–400)
RBC: 4.83 MIL/uL (ref 3.87–5.11)
RDW: 17.6 % — ABNORMAL HIGH (ref 11.5–15.5)
WBC: 24.9 10*3/uL — ABNORMAL HIGH (ref 4.0–10.5)
nRBC: 0 % (ref 0.0–0.2)

## 2021-06-18 LAB — URINALYSIS, ROUTINE W REFLEX MICROSCOPIC
Bilirubin Urine: NEGATIVE
Glucose, UA: NEGATIVE mg/dL
Ketones, ur: 5 mg/dL — AB
Nitrite: NEGATIVE
Protein, ur: 100 mg/dL — AB
Specific Gravity, Urine: >1.046 — ABNORMAL HIGH (ref 1.005–1.030)
pH: 5 (ref 5.0–8.0)

## 2021-06-18 LAB — BASIC METABOLIC PANEL
Anion gap: 14 (ref 5–15)
BUN: 23 mg/dL (ref 8–23)
CO2: 17 mmol/L — ABNORMAL LOW (ref 22–32)
Calcium: 8.5 mg/dL — ABNORMAL LOW (ref 8.9–10.3)
Chloride: 106 mmol/L (ref 98–111)
Creatinine, Ser: 1.15 mg/dL — ABNORMAL HIGH (ref 0.44–1.00)
GFR, Estimated: 49 mL/min — ABNORMAL LOW (ref >60)
Glucose, Bld: 117 mg/dL — ABNORMAL HIGH (ref 70–99)
Potassium: 3.6 mmol/L (ref 3.5–5.1)
Sodium: 137 mmol/L (ref 135–145)

## 2021-06-18 LAB — TROPONIN I (HIGH SENSITIVITY)
Troponin I (High Sensitivity): 47 ng/L — ABNORMAL HIGH (ref <18)
Troponin I (High Sensitivity): 49 ng/L — ABNORMAL HIGH (ref <18)

## 2021-06-18 LAB — TSH: TSH: 0.793 u[IU]/mL (ref 0.350–4.500)

## 2021-06-18 LAB — RESP PANEL BY RT-PCR (FLU A&B, COVID) ARPGX2
Influenza A by PCR: NEGATIVE
Influenza B by PCR: NEGATIVE
SARS Coronavirus 2 by RT PCR: NEGATIVE

## 2021-06-18 LAB — MAGNESIUM: Magnesium: 2 mg/dL (ref 1.7–2.4)

## 2021-06-18 IMAGING — CT CT L SPINE W/O CM
3 series · 12 of 33 positions shown, 14 images · IV contrast (OMNI)
Comparison: None.

CLINICAL DATA: Low back pain [6P] ([6P]-CM)

EXAM:
CT LUMBAR SPINE WITHOUT CONTRAST
TECHNIQUE: Multidetector CT imaging of the lumbar spine was performed without
intravenous contrast administration. Multiplanar CT image
reconstructions were also generated.

[Series 17: l-spine_cor st · axial · 0.34mm/px · z∈[-393,-206]mm · 4 of 136 slices shown, 5 images]
[im 21/136  soft-tissue]
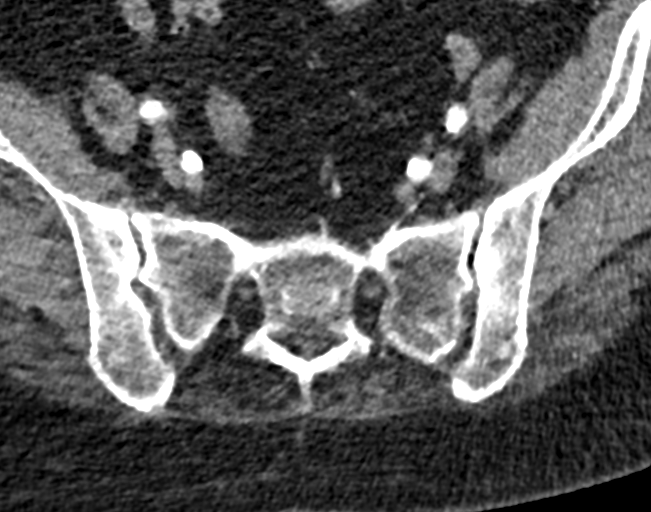
[im 21/136  bone]
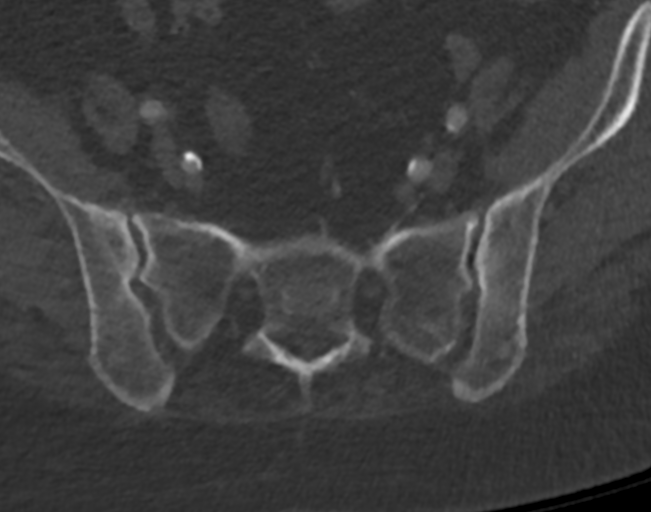
[im 52/136  bone]
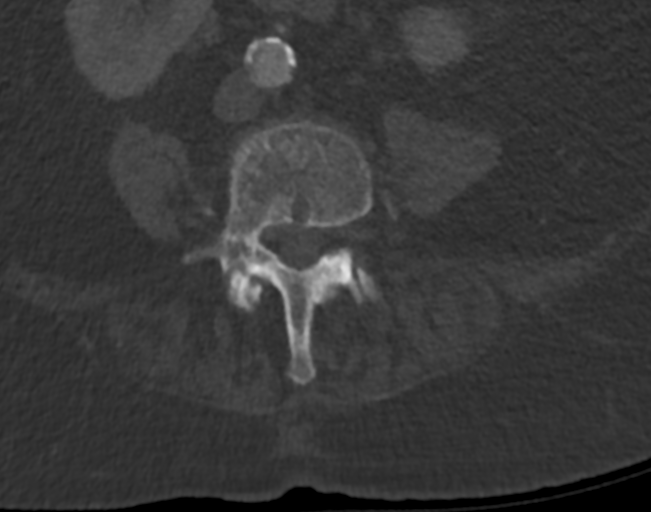
[im 84/136  bone]
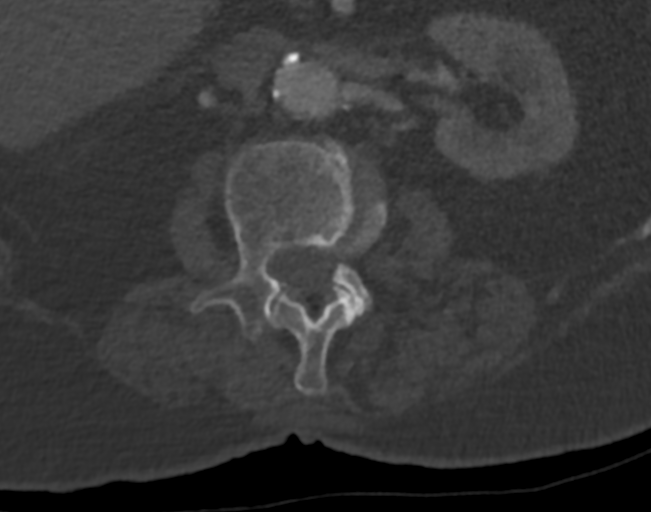
[im 115/136  bone]
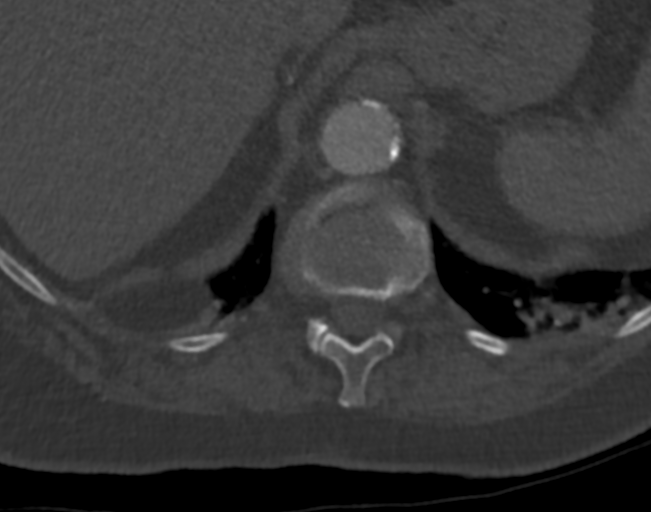

[Series 18: l-spine_sag st · sagittal · 0.41mm/px · 5 of 128 slices shown, 6 images]
[im 43/128  bone]
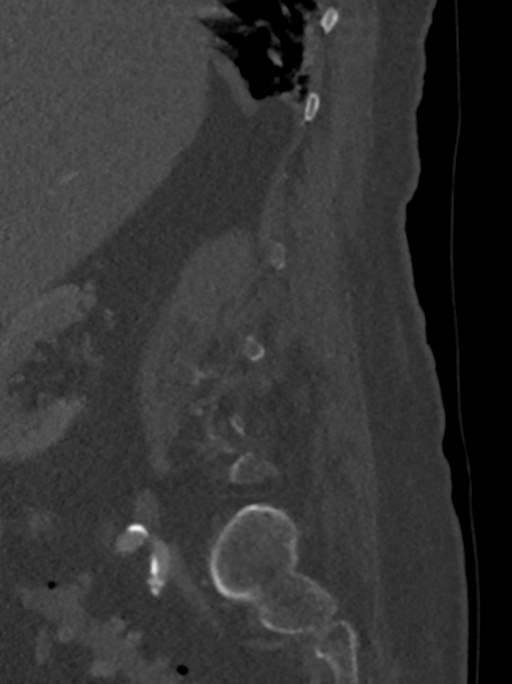
[im 53/128  bone]
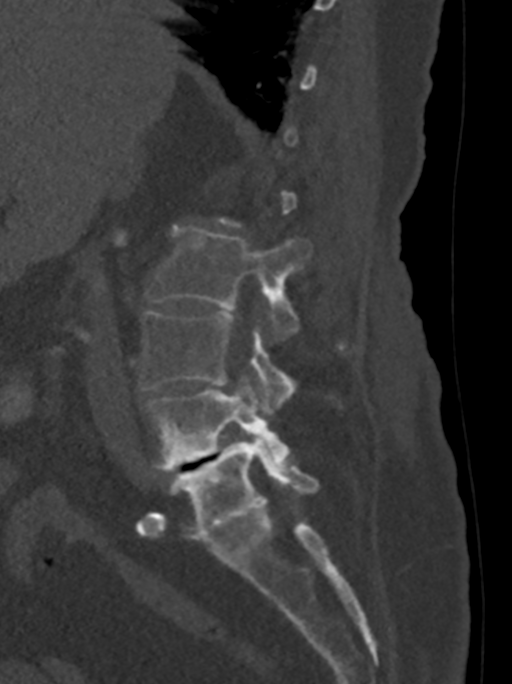
[im 64/128  soft-tissue]
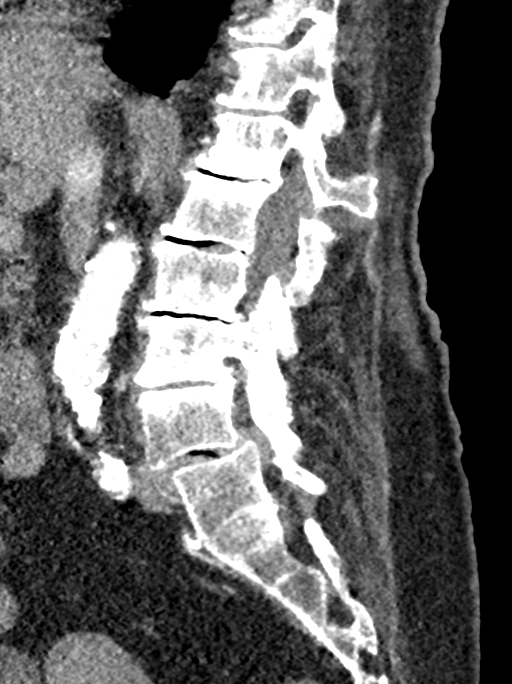
[im 64/128  bone]
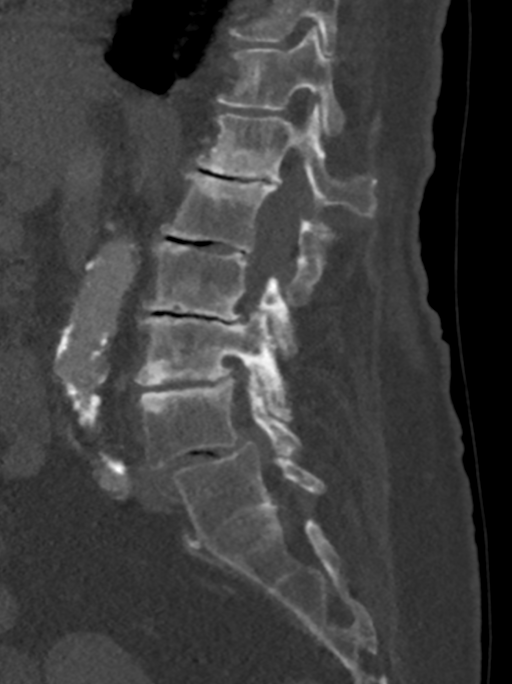
[im 75/128  bone]
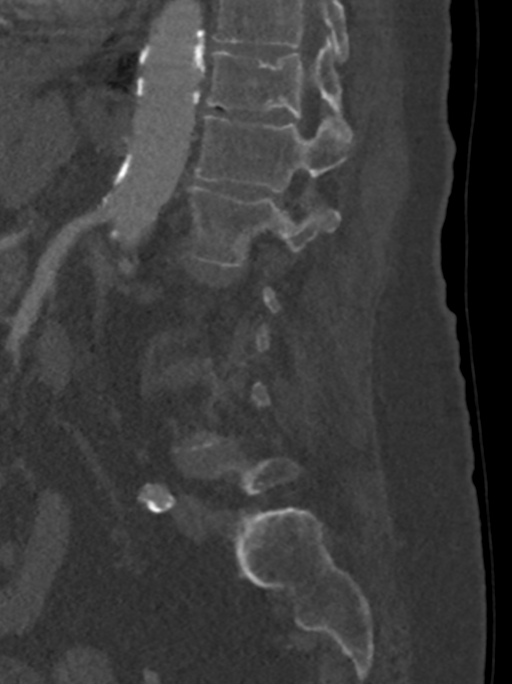
[im 85/128  bone]
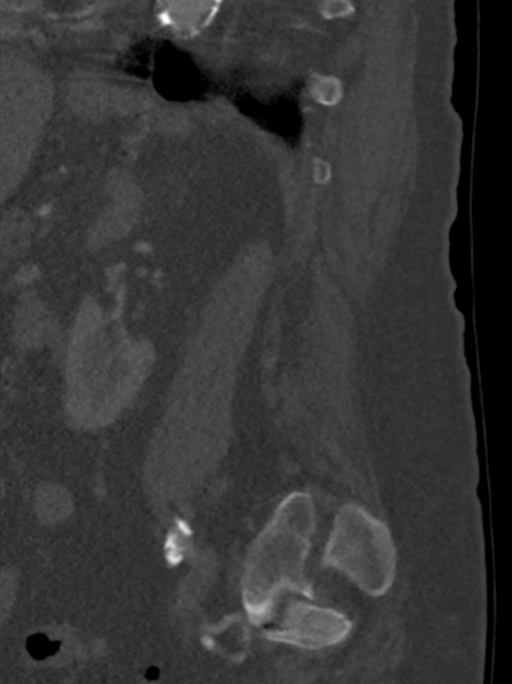

[Series 19: l-spine_cor · coronal · 0.50mm/px · 3 of 105 slices shown]
[im 21/105  bone]
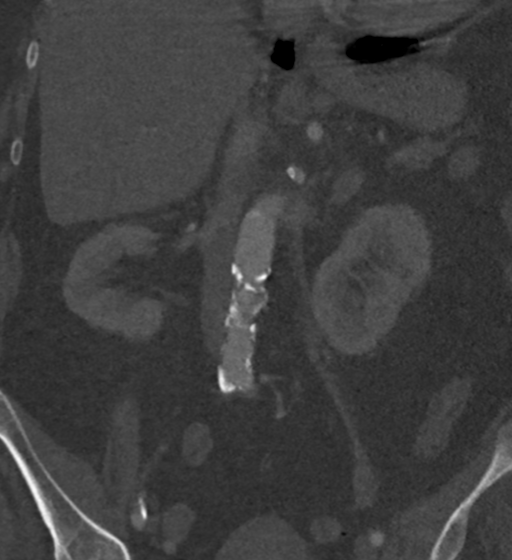
[im 42/105  bone]
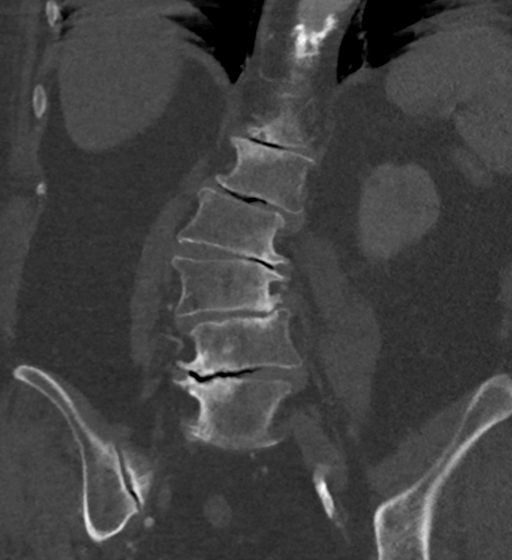
[im 63/105  bone]
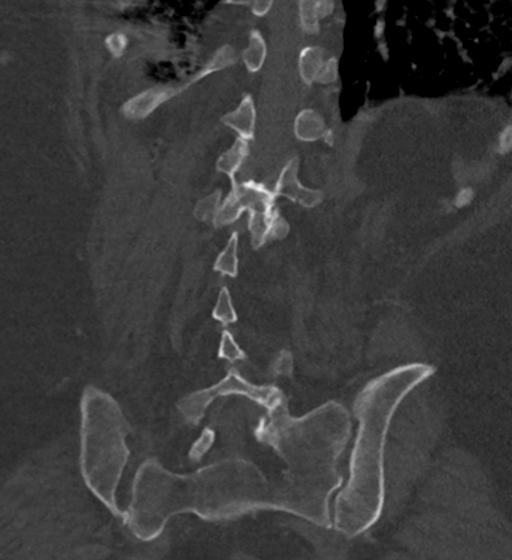

[12 of 33 positions shown; findings below may reference images not displayed]

FINDINGS: Segmentation: Transitional lumbosacral anatomy. For the purposes of
this dictation there is a partially sacralized L5 vertebral body.
Small/hypoplastic ribs at T12.

Alignment: Dextrocurvature centered at L2-L3. grade 1
anterolisthesis of L4 on L5 with right-sided L4 pars defect.

Vertebrae: Bony fusion across the L5-S1 disc space. No evidence of
acute fracture. Vertebral body heights are maintained. Osteopenia.

Paraspinal and other soft tissues: Please see same day CTA chest
abdomen/pelvis for intra-abdominal and intrathoracic evaluation.

Disc levels: Severe multilevel degenerative disease at T12-L1,
L1-L2, L2-L3, L3-L4 and L4-L5 with disc height loss, endplate
sclerosis, and vacuum disc phenomenon. At L4-L5 there is potentially
severe canal stenosis at least moderate right foraminal stenosis.
Multilevel endplate spurring and severe facet hypertrophy with at
least moderate left foraminal stenosis at L2-L3 and L3-L4.
IMPRESSION: 1. Transitional lumbosacral anatomy, as detailed above.
2. Grade 1 anterolisthesis of L4 on L5 with right L4 pars defect and
severe degenerative change with potentially severe canal stenosis
and at least moderate right foraminal stenosis at this level.
3. Dextrocurvature centered at L2-L3 with severe multilevel
degenerative change and at least moderate left foraminal stenosis at
L2-L3 and L3-L4.
4. Given the above findings, recommend an MRI of the lumbar spine to
better evaluate the canal and foramina.
5. No evidence of acute fracture or clearly traumatic malalignment.

## 2021-06-18 IMAGING — DX DG CHEST 2V
2 series · 2 of 2 positions shown · non-contrast
Comparison: [DATE].

CLINICAL DATA: Back pain.  Hypoxia.

EXAM:
CHEST - 2 VIEW

[chest ap]
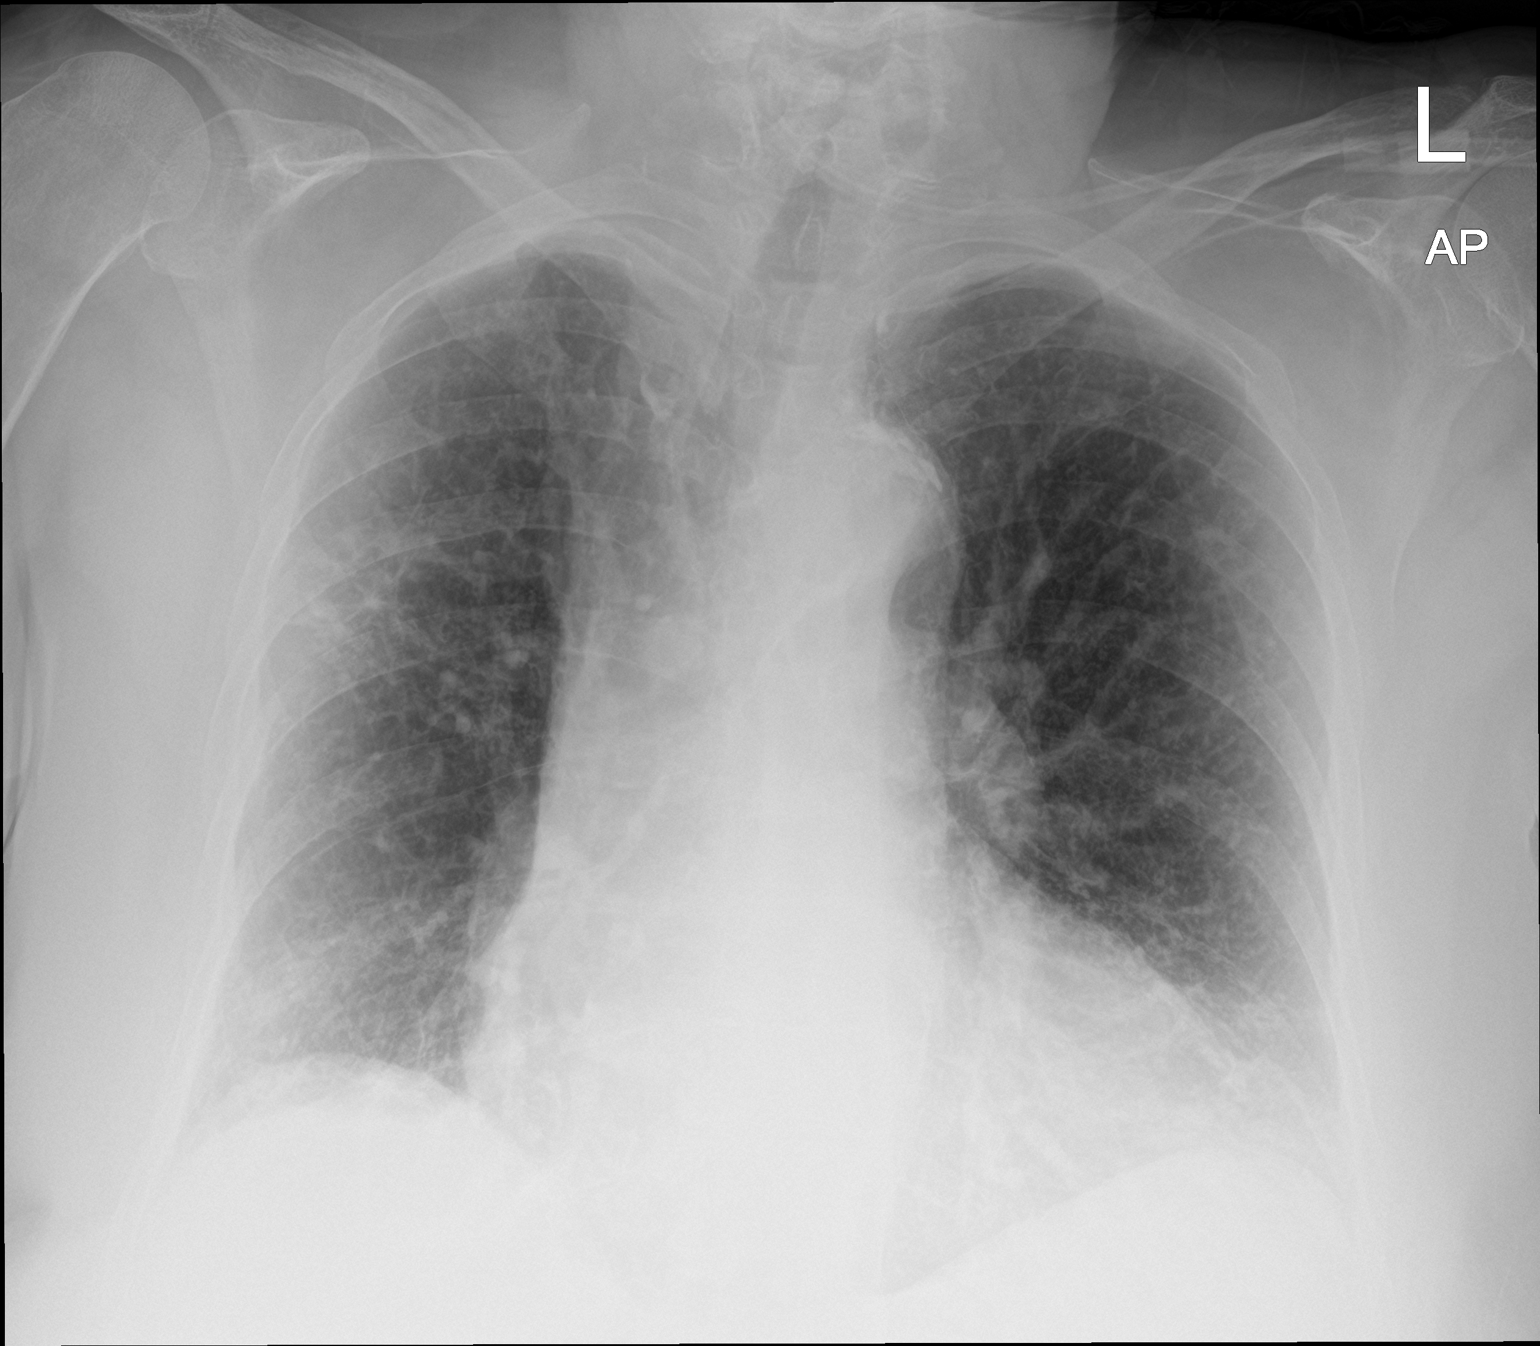

[chest lat]
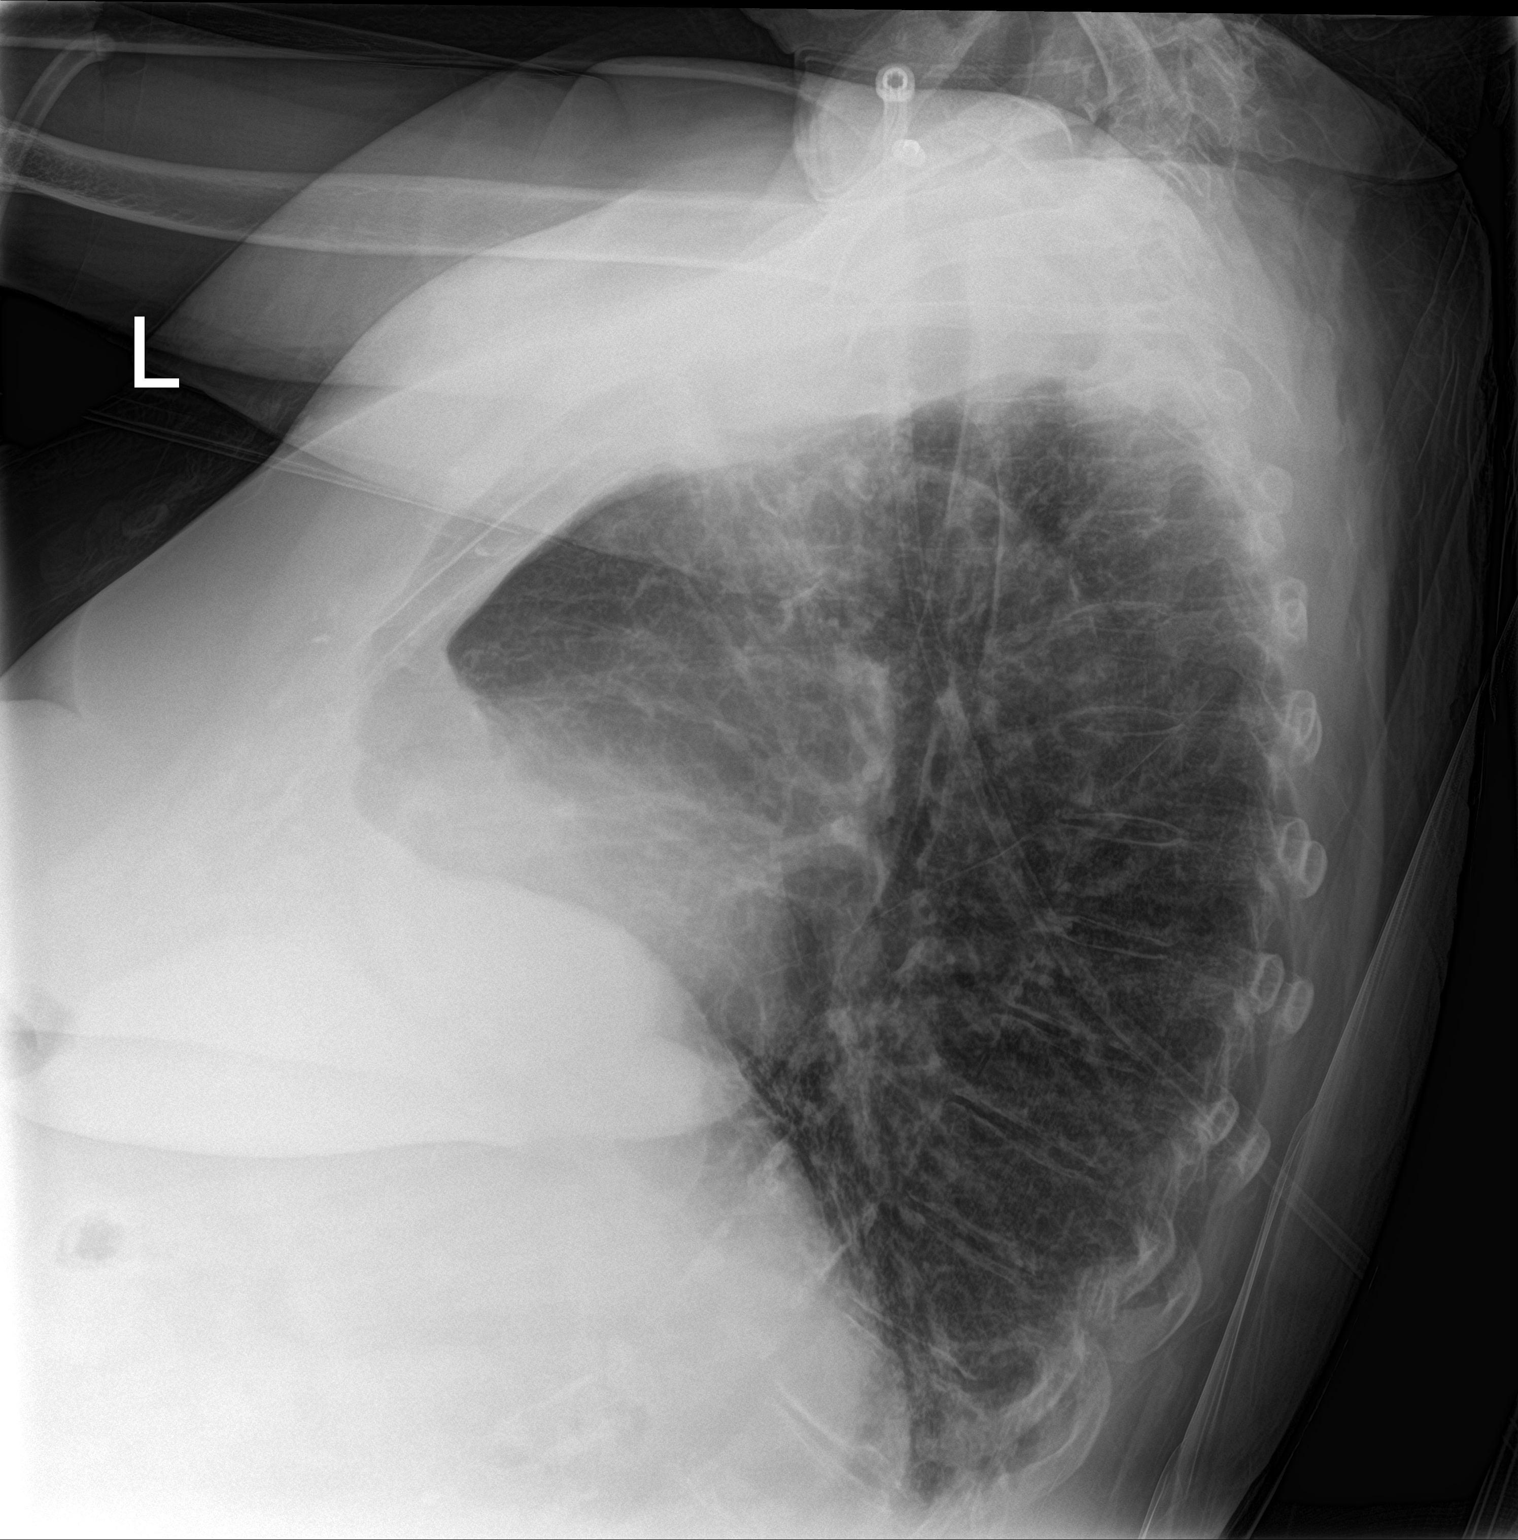

[2 of 2 positions shown; findings below may reference images not displayed]

FINDINGS: Enlarged cardiac silhouette. Calcific atherosclerosis aorta.
Chronically prominent lung markings with mildly increased streaky
opacities in the right upper lung and bilateral lung bases. No
visible pleural effusions or pneumothorax. Osteopenia. Visualized
thoracic vertebral body heights are maintained.
IMPRESSION: 1. Chronically prominent lung markings with mildly increased streaky
opacities in the right upper lung and bilateral lung bases, which
could represent atelectasis, aspiration, and or pneumonia.
2. Cardiomegaly.

## 2021-06-18 IMAGING — CT CT L SPINE W/O CM
3 series · 11 of 33 positions shown, 13 images · IV contrast (OMNI)
Comparison: None.

CLINICAL DATA: Low back pain [6P] ([6P]-CM)

EXAM:
CT LUMBAR SPINE WITHOUT CONTRAST
TECHNIQUE: Multidetector CT imaging of the lumbar spine was performed without
intravenous contrast administration. Multiplanar CT image
reconstructions were also generated.

[Series 12: ax lsp bn · axial · 0.34mm/px · z∈[-386,-218]mm · 3 of 91 slices shown, 4 images (1 of 3)]
[im 21/91  soft-tissue]
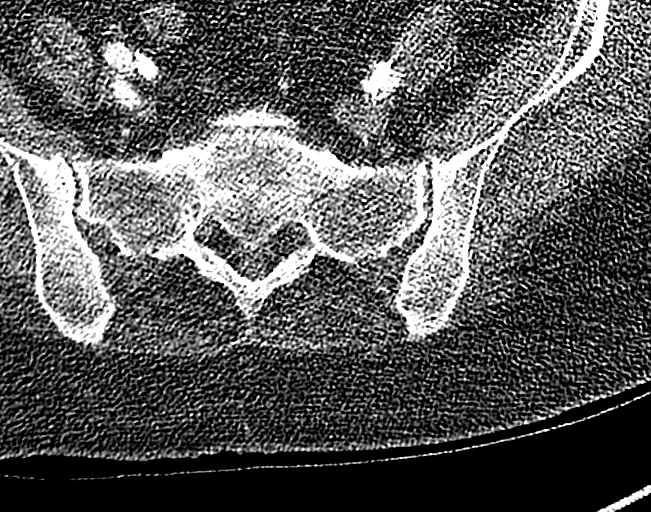
[im 21/91  bone]
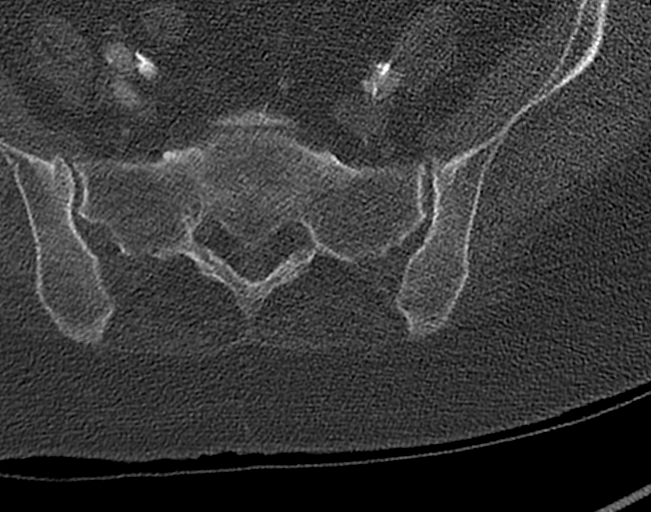
[im 49/91  bone]
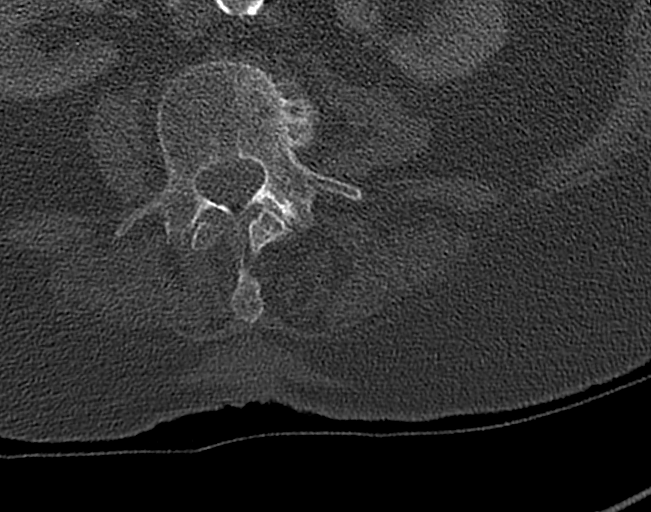
[im 77/91  bone]
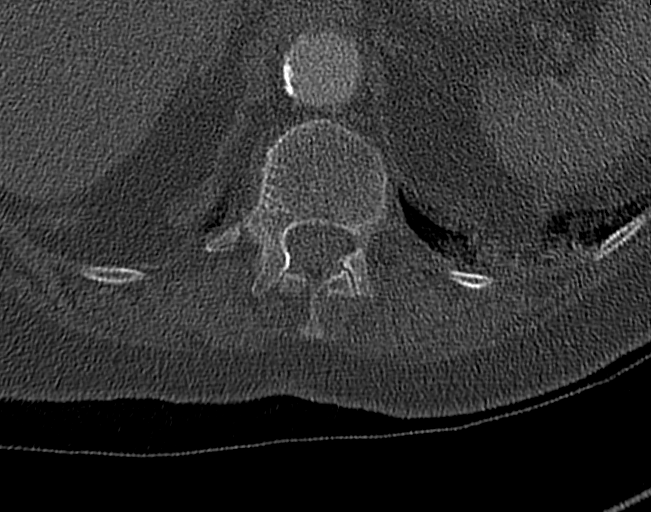

[Series 14: ax lsp bn · sagittal · 0.34mm/px · 5 of 54 slices shown, 6 images (2 of 3)]
[im 18/54  bone]
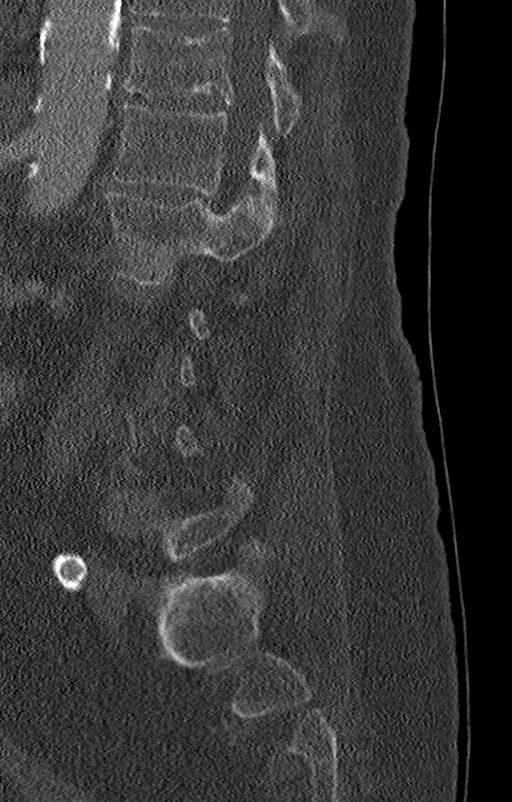
[im 23/54  bone]
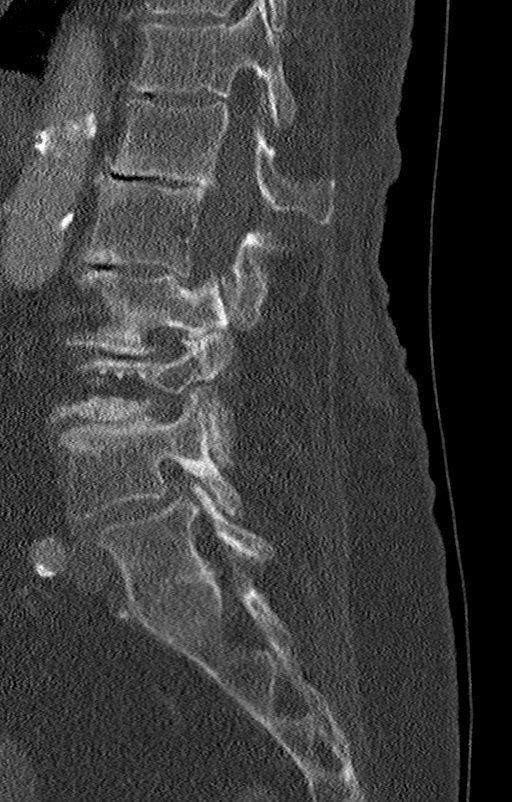
[im 27/54  soft-tissue]
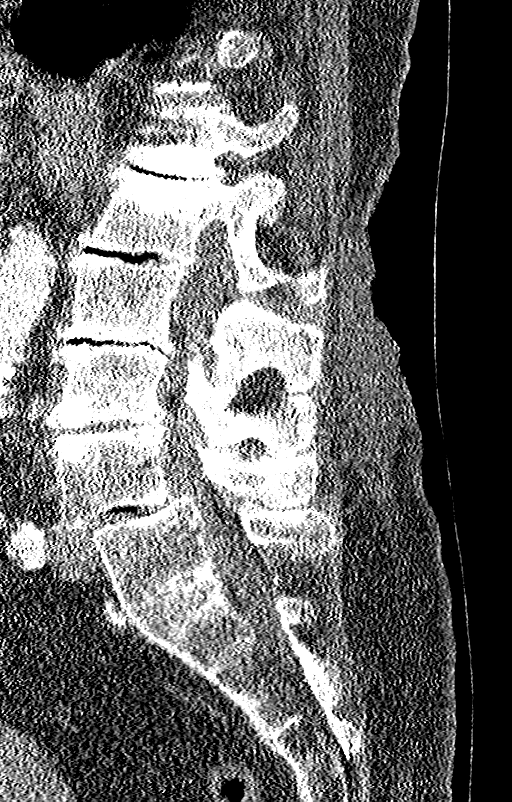
[im 27/54  bone]
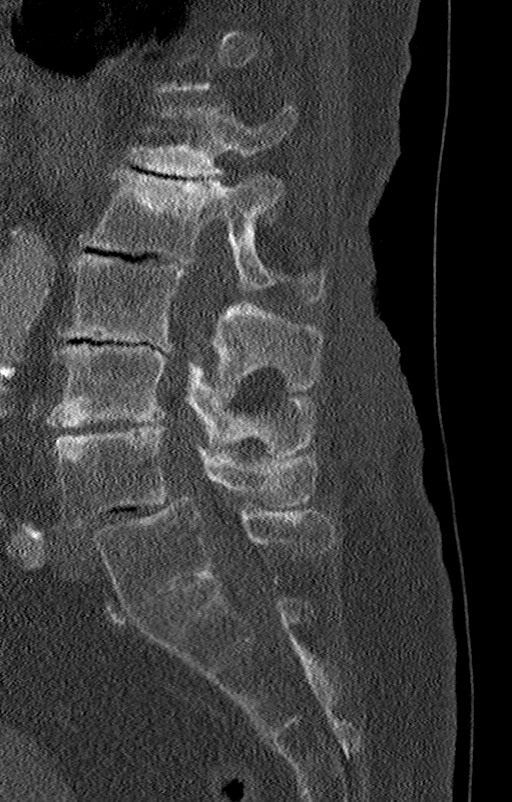
[im 31/54  bone]
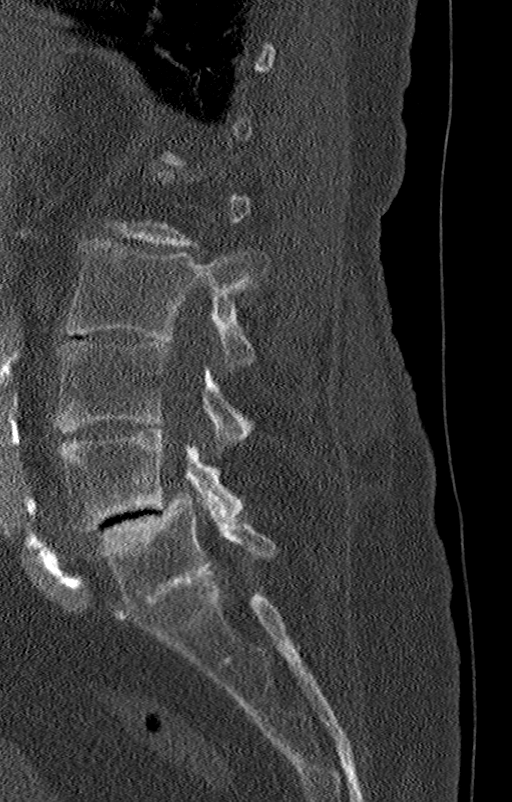
[im 36/54  bone]
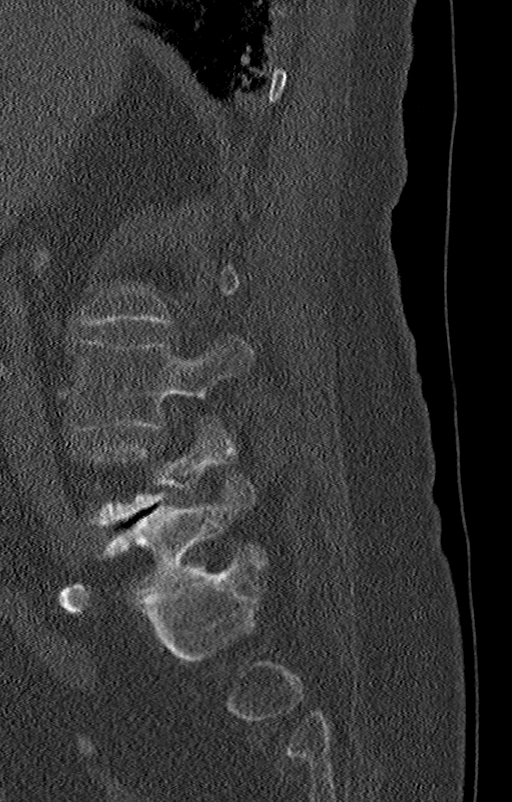

[Series 15: ax lsp bn · coronal · 0.31mm/px · 3 of 58 slices shown (3 of 3)]
[im 12/58  bone]
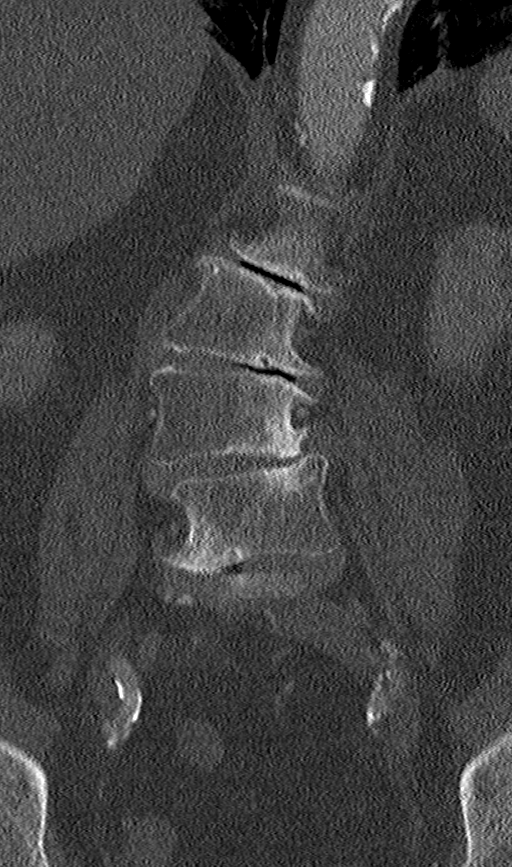
[im 23/58  bone]
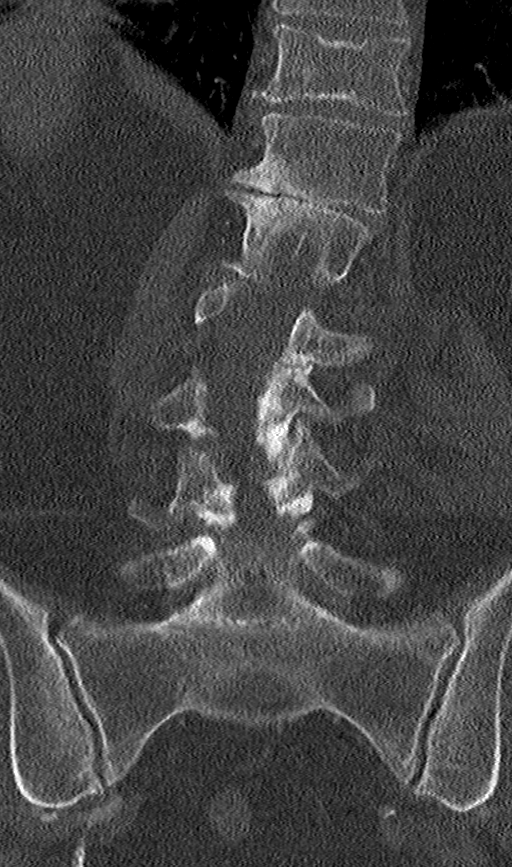
[im 35/58  bone]
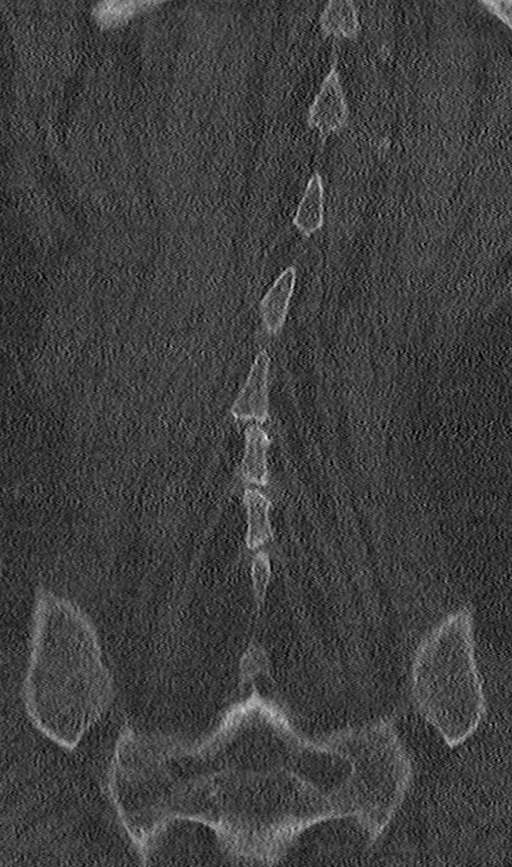

[11 of 33 positions shown; findings below may reference images not displayed]

FINDINGS: Segmentation: Transitional lumbosacral anatomy. For the purposes of
this dictation there is a partially sacralized L5 vertebral body.
Small/hypoplastic ribs at T12.

Alignment: Dextrocurvature centered at L2-L3. grade 1
anterolisthesis of L4 on L5 with right-sided L4 pars defect.

Vertebrae: Bony fusion across the L5-S1 disc space. No evidence of
acute fracture. Vertebral body heights are maintained. Osteopenia.

Paraspinal and other soft tissues: Please see same day CTA chest
abdomen/pelvis for intra-abdominal and intrathoracic evaluation.

Disc levels: Severe multilevel degenerative disease at T12-L1,
L1-L2, L2-L3, L3-L4 and L4-L5 with disc height loss, endplate
sclerosis, and vacuum disc phenomenon. At L4-L5 there is potentially
severe canal stenosis at least moderate right foraminal stenosis.
Multilevel endplate spurring and severe facet hypertrophy with at
least moderate left foraminal stenosis at L2-L3 and L3-L4.
IMPRESSION: 1. Transitional lumbosacral anatomy, as detailed above.
2. Grade 1 anterolisthesis of L4 on L5 with right L4 pars defect and
severe degenerative change with potentially severe canal stenosis
and at least moderate right foraminal stenosis at this level.
3. Dextrocurvature centered at L2-L3 with severe multilevel
degenerative change and at least moderate left foraminal stenosis at
L2-L3 and L3-L4.
4. Given the above findings, recommend an MRI of the lumbar spine to
better evaluate the canal and foramina.
5. No evidence of acute fracture or clearly traumatic malalignment.

## 2021-06-18 IMAGING — CT CT ANGIO CHEST-ABD-PELV FOR DISSECTION W/ AND WO/W CM
2 of 10 series · 13 of 46 positions shown, 15 images · IV contrast (OMNI)
Comparison: None.

CLINICAL DATA: Chest pain.  Aortic dissection suspected.

EXAM:
CT ANGIOGRAPHY CHEST, ABDOMEN AND PELVIS
TECHNIQUE: Non-contrast CT of the chest was initially obtained.

[Series 7: dissection 1.0 i30f 3 · axial · 0.84mm/px · z∈[-550,+24]mm · 10 of 643 slices shown, 12 images]
[im 34/643  soft-tissue]
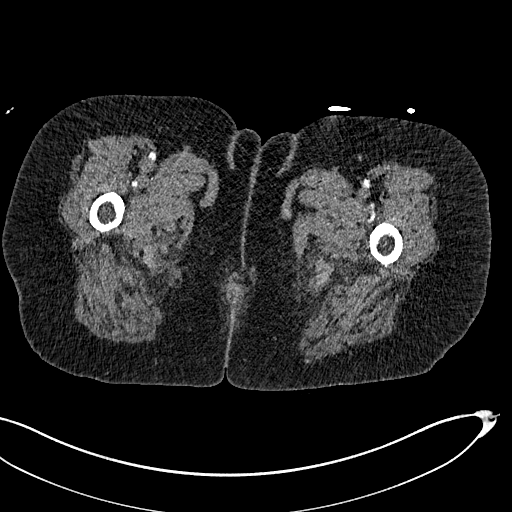
[im 34/643  bone]
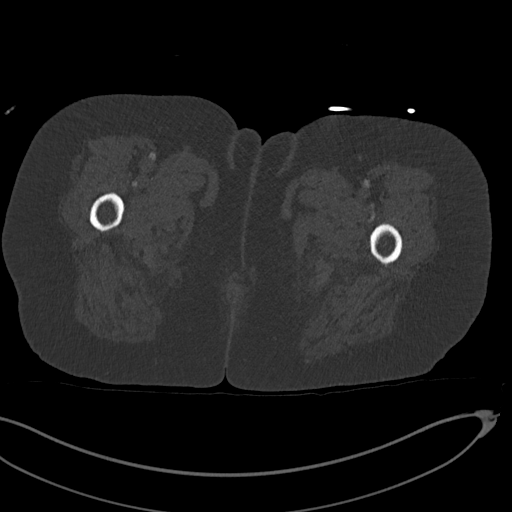
[im 102/643  soft-tissue]
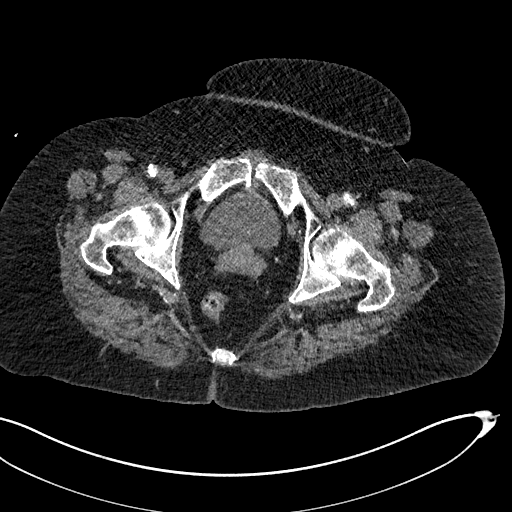
[im 169/643  soft-tissue]
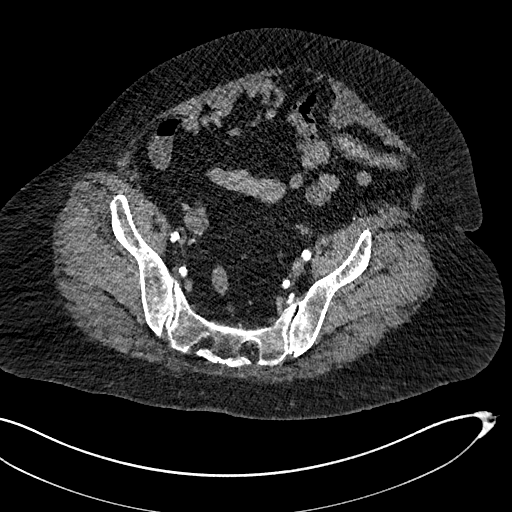
[im 237/643  soft-tissue]
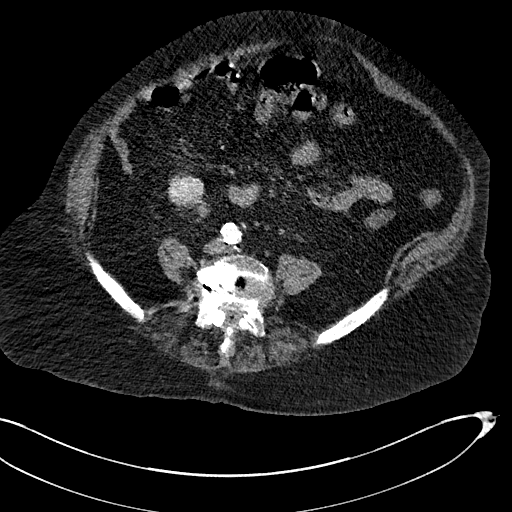
[im 305/643  soft-tissue]
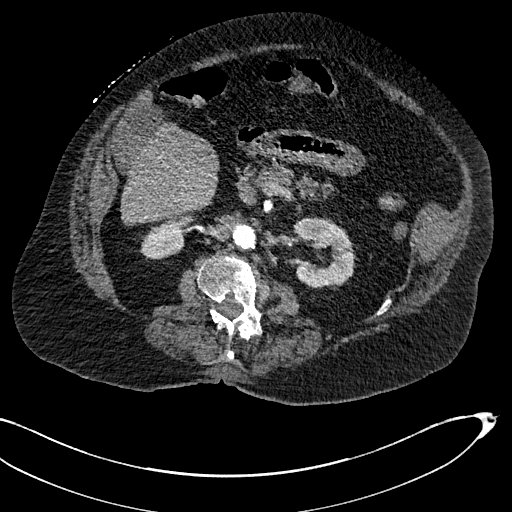
[im 338/643  soft-tissue]
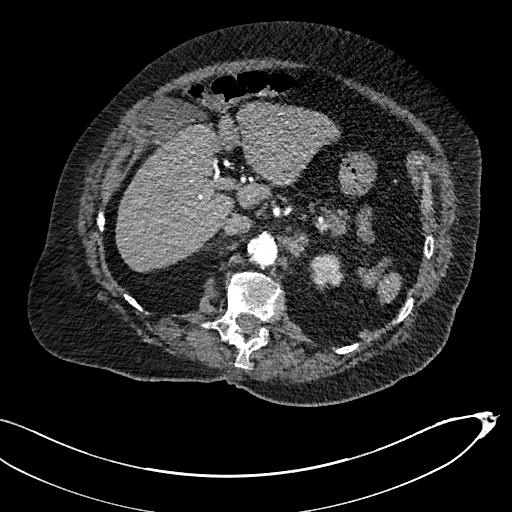
[im 406/643  soft-tissue]
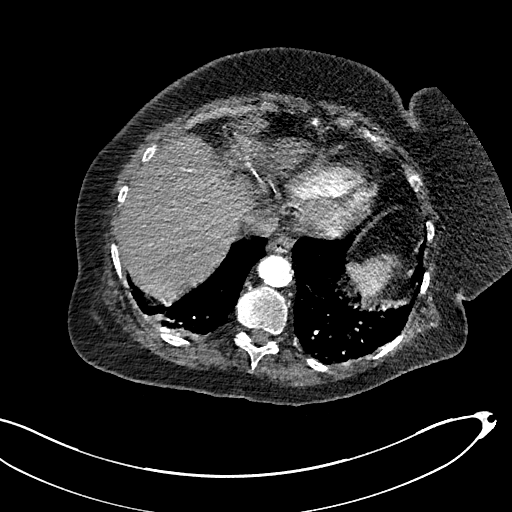
[im 474/643  soft-tissue]
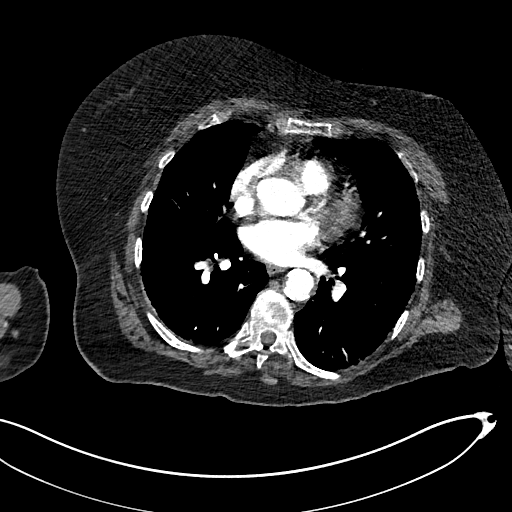
[im 541/643  soft-tissue]
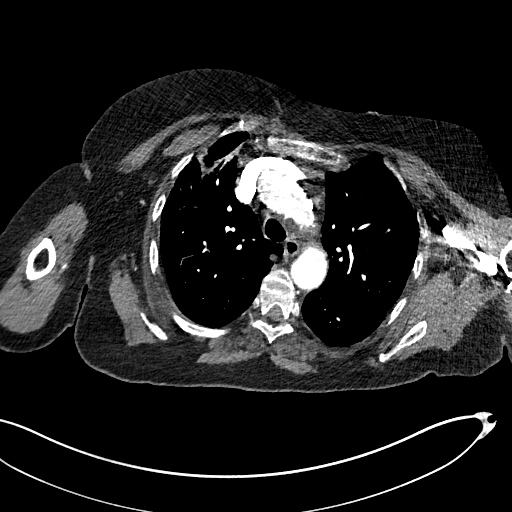
[im 541/643  bone]
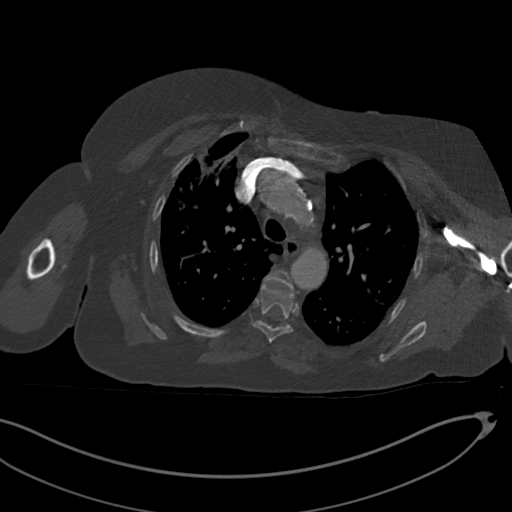
[im 609/643  soft-tissue]
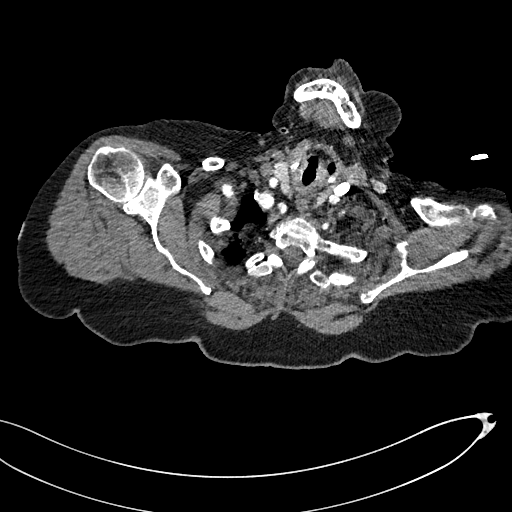

[Series 8: coronals · coronal · 0.94mm/px · 3 of 159 slices shown]
[im 40/159  soft-tissue]
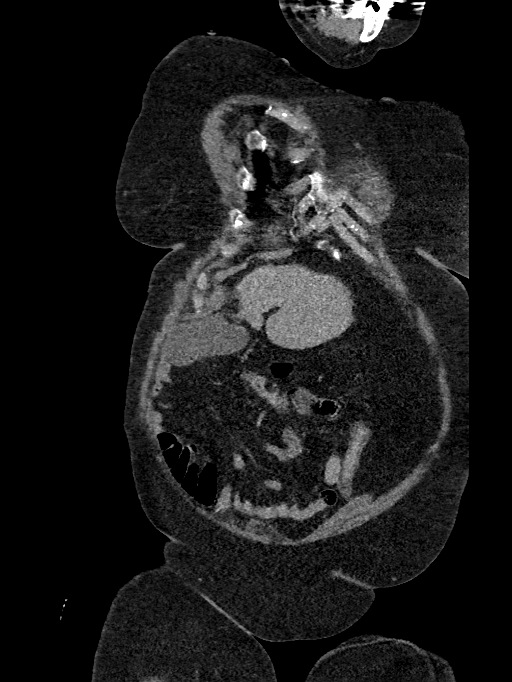
[im 80/159  soft-tissue]
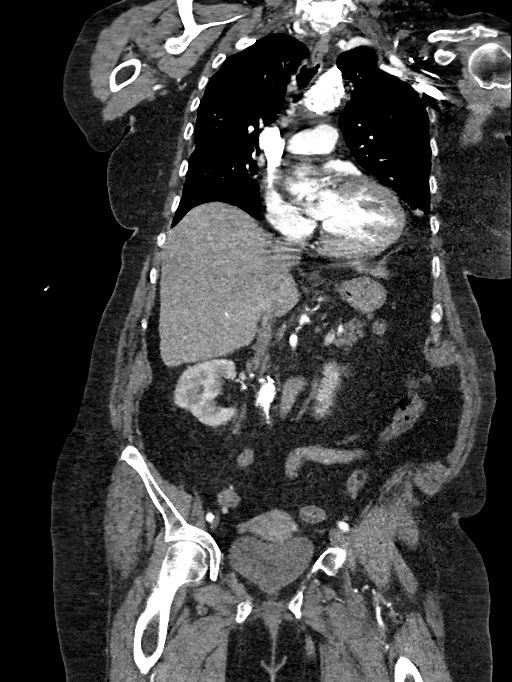
[im 119/159  soft-tissue]
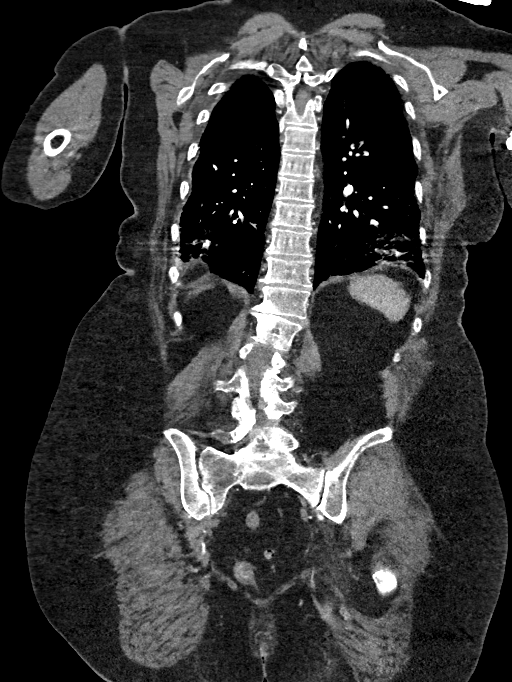

[13 of 46 positions shown; findings below may reference images not displayed]

Multidetector CT imaging through the chest, abdomen and pelvis was
performed using the standard protocol during bolus administration of
intravenous contrast. Multiplanar reconstructed images and MIPs were
obtained and reviewed to evaluate the vascular anatomy.

CONTRAST:  100mL OMNIPAQUE IOHEXOL 350 MG/ML SOLN
FINDINGS: CTA CHEST FINDINGS

Cardiovascular: Pre contrast imaging shows no hyperdense crescent in
the wall of the thoracic aorta to suggest acute intramural hematoma.
Postcontrast imaging shows no dissection of the thoracic aorta. No
thoracic aortic aneurysm. Ascending thoracic aorta measures 3.7 cm
diameter. Moderate atherosclerotic calcification is noted in the
wall of the thoracic aorta. No large central pulmonary embolus in
the pulmonary outflow tract or main pulmonary arteries. No filling
defect in the lobar pulmonary arteries to either lung to suggest
pulmonary embolus.

Cardiopericardial silhouette is at upper limits of normal for size.
Coronary artery calcification is evident.

Mediastinum/Nodes: No mediastinal lymphadenopathy. There is no hilar
lymphadenopathy. The esophagus has normal imaging features. There is
no axillary lymphadenopathy.

Lungs/Pleura: Centrilobular and paraseptal emphysema evident.
Probable scarring anterior right upper lobe. 12 mm nodular opacity
seen along the inferior margin of the apparent scarring (39/6). 2 cm
irregular opacity noted posterior right costophrenic sulcus on 73/6.
Atelectasis noted left lower lobe. 5 mm left lower lobe nodule
evident on 59/6. 7 mm right middle lobe nodule visible on 67/6. No
pleural effusion.

Musculoskeletal: No worrisome lytic or sclerotic osseous
abnormality.

Review of the MIP images confirms the above findings.

CTA ABDOMEN AND PELVIS FINDINGS

VASCULAR

Aorta: Normal caliber aorta without aneurysm, dissection, vasculitis
or significant stenosis.

Celiac: Patent without evidence of aneurysm, dissection, vasculitis
or significant stenosis.

SMA: Dense calcific plaque at the ostium with moderate luminal
narrowing. No substantial post stenotic dilatation. No evidence for
vasculitis or dissection.

Renals: Both renal arteries are patent without evidence of aneurysm,
dissection, vasculitis, fibromuscular dysplasia or significant
stenosis.

IMA: Patent without evidence of aneurysm, dissection, vasculitis or
significant stenosis.

Inflow: Patent without evidence of aneurysm, dissection, vasculitis
or significant stenosis.

Veins: No obvious venous abnormality within the limitations of this
arterial phase study.

Review of the MIP images confirms the above findings.

NON-VASCULAR

Hepatobiliary: No suspicious focal abnormality within the liver
parenchyma. Gallbladder is distended. No intrahepatic or
extrahepatic biliary dilation.

Pancreas: No focal mass lesion. No dilatation of the main duct. No
intraparenchymal cyst. No peripancreatic edema.

Spleen: No splenomegaly. No focal mass lesion.

Adrenals/Urinary Tract: Thickening of the right adrenal gland noted.
Multiple left adrenal nodules measure up to 16 mm in size, although
attenuation too high to allow definitive characterization as
adenomas. No suspicious enhancing renal mass lesion. 2 mm
nonobstructing stone noted upper pole left kidney. No evidence for
hydroureter. The urinary bladder appears normal for the degree of
distention.

Stomach/Bowel: Stomach is nondistended. Duodenum is normally
positioned as is the ligament of Treitz. No small bowel wall
thickening. No small bowel dilatation. The cecum is mobile,
positioned in the anterior midline abdomen. The terminal ileum is
normal. The appendix is normal. Diverticular changes are noted in
the left colon without evidence of diverticulitis.

Lymphatic: There is no gastrohepatic or hepatoduodenal ligament
lymphadenopathy. No retroperitoneal or mesenteric lymphadenopathy.
No pelvic sidewall lymphadenopathy.

Reproductive: The uterus is unremarkable.  There is no adnexal mass.

Other: No intraperitoneal free fluid.

Musculoskeletal: No worrisome lytic or sclerotic osseous
abnormality. Degenerative disc disease noted lumbar spine.

Review of the MIP images confirms the above findings.
IMPRESSION: 1. No evidence for thoracoabdominal aortic aneurysm or dissection.
No acute intramural hematoma of the thoracic aorta.
2. No large central pulmonary embolus.
3. Multiple bilateral pulmonary nodules measuring up to 2 cm. These
larger nodules may reflect infectious/inflammatory etiology.
Follow-up CT chest in 3 months after therapy recommended.
4. Moderate narrowing at the ostium of the SMA.
5. Multiple left adrenal nodules measuring up to 16 mm in size,
although attenuation is too high to allow definitive
characterization as adenomas. This could be reassessed at the time
of follow-up chest CT or abdominal MRI could be used for subsequent
more definitive characterization as warranted.
6. 2 mm nonobstructing left renal stone.
7. Left colonic diverticulosis without diverticulitis.
8. Aortic Atherosclerosis ([2Q]-[2Q]) and Emphysema ([2Q]-[2Q]).

## 2021-06-18 MED ORDER — LIDOCAINE 5 % EX PTCH
1.0000 | MEDICATED_PATCH | Freq: Once | CUTANEOUS | Status: AC
  Administered 2021-06-18: 1 via TRANSDERMAL
  Filled 2021-06-18: qty 1

## 2021-06-18 MED ORDER — SODIUM CHLORIDE 0.9 % IV BOLUS
1000.0000 mL | Freq: Once | INTRAVENOUS | Status: AC
  Administered 2021-06-18: 1000 mL via INTRAVENOUS

## 2021-06-18 MED ORDER — MORPHINE SULFATE (PF) 2 MG/ML IV SOLN
1.0000 mg | Freq: Once | INTRAVENOUS | Status: AC | PRN
  Administered 2021-06-18: 1 mg via INTRAVENOUS
  Filled 2021-06-18: qty 1

## 2021-06-18 MED ORDER — SODIUM CHLORIDE 0.9 % IV SOLN
3.0000 g | Freq: Three times a day (TID) | INTRAVENOUS | Status: DC
  Filled 2021-06-18: qty 8

## 2021-06-18 MED ORDER — LIDOCAINE 5 % EX PTCH
1.0000 | MEDICATED_PATCH | Freq: Once | CUTANEOUS | Status: DC
  Administered 2021-06-18: 1 via TRANSDERMAL
  Filled 2021-06-18: qty 1

## 2021-06-18 MED ORDER — ALBUTEROL SULFATE (2.5 MG/3ML) 0.083% IN NEBU
3.0000 mL | INHALATION_SOLUTION | RESPIRATORY_TRACT | Status: DC | PRN
  Administered 2021-06-20: 3 mL via RESPIRATORY_TRACT
  Filled 2021-06-18 (×2): qty 3

## 2021-06-18 MED ORDER — ESCITALOPRAM OXALATE 10 MG PO TABS
5.0000 mg | ORAL_TABLET | Freq: Every day | ORAL | Status: DC
  Administered 2021-06-19 – 2021-06-23 (×5): 5 mg via ORAL
  Filled 2021-06-18 (×7): qty 1

## 2021-06-18 MED ORDER — OXYCODONE-ACETAMINOPHEN 5-325 MG PO TABS: 1.0000 | ORAL_TABLET | Freq: Four times a day (QID) | ORAL | Status: DC | PRN

## 2021-06-18 MED ORDER — FENTANYL CITRATE (PF) 100 MCG/2ML IJ SOLN
50.0000 ug | Freq: Once | INTRAMUSCULAR | Status: AC
  Administered 2021-06-18: 50 ug via INTRAVENOUS
  Filled 2021-06-18: qty 2

## 2021-06-18 MED ORDER — IOHEXOL 350 MG/ML SOLN
100.0000 mL | Freq: Once | INTRAVENOUS | Status: AC | PRN
  Administered 2021-06-18: 100 mL via INTRAVENOUS

## 2021-06-18 MED ORDER — METHOCARBAMOL 500 MG PO TABS
500.0000 mg | ORAL_TABLET | Freq: Once | ORAL | Status: AC
  Administered 2021-06-18: 500 mg via ORAL
  Filled 2021-06-18: qty 1

## 2021-06-18 MED ORDER — MORPHINE SULFATE (PF) 4 MG/ML IV SOLN
4.0000 mg | Freq: Once | INTRAVENOUS | Status: AC
  Administered 2021-06-18: 4 mg via INTRAVENOUS
  Filled 2021-06-18: qty 1

## 2021-06-18 MED ORDER — AZITHROMYCIN 500 MG IV SOLR
500.0000 mg | Freq: Once | INTRAVENOUS | Status: AC
  Administered 2021-06-18: 500 mg via INTRAVENOUS
  Filled 2021-06-18: qty 500

## 2021-06-18 MED ORDER — SODIUM CHLORIDE 0.9 % IV BOLUS
500.0000 mL | Freq: Once | INTRAVENOUS | Status: AC
  Administered 2021-06-18: 500 mL via INTRAVENOUS

## 2021-06-18 MED ORDER — SODIUM CHLORIDE 0.9 % IV SOLN
1.0000 g | Freq: Once | INTRAVENOUS | Status: AC
  Administered 2021-06-18: 1 g via INTRAVENOUS
  Filled 2021-06-18: qty 10

## 2021-06-18 MED ORDER — METOPROLOL TARTRATE 25 MG PO TABS
25.0000 mg | ORAL_TABLET | Freq: Two times a day (BID) | ORAL | Status: DC
  Administered 2021-06-19 – 2021-06-20 (×4): 25 mg via ORAL
  Filled 2021-06-18 (×4): qty 1

## 2021-06-18 MED ORDER — MOMETASONE FURO-FORMOTEROL FUM 200-5 MCG/ACT IN AERO
2.0000 | INHALATION_SPRAY | Freq: Two times a day (BID) | RESPIRATORY_TRACT | Status: DC
  Filled 2021-06-18: qty 8.8

## 2021-06-18 MED ORDER — PANTOPRAZOLE SODIUM 40 MG PO TBEC
40.0000 mg | DELAYED_RELEASE_TABLET | Freq: Every day | ORAL | Status: DC
  Administered 2021-06-19 – 2021-06-25 (×7): 40 mg via ORAL
  Filled 2021-06-18 (×7): qty 1

## 2021-06-18 MED ORDER — SODIUM CHLORIDE 0.9 % IV SOLN
3.0000 g | Freq: Once | INTRAVENOUS | Status: DC
  Administered 2021-06-19: 3 g via INTRAVENOUS
  Filled 2021-06-18: qty 8

## 2021-06-18 MED ORDER — ATORVASTATIN CALCIUM 80 MG PO TABS
80.0000 mg | ORAL_TABLET | Freq: Every day | ORAL | Status: DC
  Administered 2021-06-19 – 2021-06-25 (×7): 80 mg via ORAL
  Filled 2021-06-18 (×4): qty 1
  Filled 2021-06-18: qty 2
  Filled 2021-06-18 (×2): qty 1

## 2021-06-18 MED ORDER — OXYCODONE-ACETAMINOPHEN 5-325 MG PO TABS
1.0000 | ORAL_TABLET | Freq: Once | ORAL | Status: AC
  Administered 2021-06-18: 1 via ORAL
  Filled 2021-06-18: qty 1

## 2021-06-18 MED ORDER — MUSCLE RUB 10-15 % EX CREA
TOPICAL_CREAM | CUTANEOUS | Status: DC | PRN
  Administered 2021-06-20: 1 via TOPICAL
  Filled 2021-06-18: qty 85

## 2021-06-18 MED ORDER — ONDANSETRON HCL 4 MG/2ML IJ SOLN
4.0000 mg | Freq: Once | INTRAMUSCULAR | Status: AC
Start: 2021-06-18 — End: 2021-06-18
  Administered 2021-06-18: 4 mg via INTRAVENOUS
  Filled 2021-06-18: qty 2

## 2021-06-18 NOTE — Progress Notes (Signed)
Pharmacy Antibiotic Note  Samantha King is a 75 y.o. female admitted on 06/18/2021 with pneumonia.  Pharmacy has been consulted for unasyn dosing.  Plan: Unasyn 3gm IV q8 hours F/u renal function, cultures and clinical course  Height: '5\' 3"'$  (160 cm) Weight: 84.8 kg (186 lb 15.2 oz) IBW/kg (Calculated) : 52.4  Temp (24hrs), Avg:97.7 F (36.5 C), Min:97.7 F (36.5 C), Max:97.7 F (36.5 C)  Recent Labs  Lab 06/18/21 1412 06/18/21 1530 06/18/21 1808  WBC 24.9*  --   --   CREATININE 1.15*  --   --   LATICACIDVEN  --  2.0* 1.5    Estimated Creatinine Clearance: 42.3 mL/min (A) (by C-G formula based on SCr of 1.15 mg/dL (H)).    Allergies  Allergen Reactions   Shellfish Allergy Nausea And Vomiting   Codeine Other (See Comments)    Makes me hyperactive and not able to sleep   Lidocaine Palpitations     Thank you for allowing pharmacy to be a part of this patient's care.  Hanalei Glace, [place] 06/18/2021 11:57 PM
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
## 2021-06-18 NOTE — ED Triage Notes (Signed)
Pt BIB [facility] from home with complaints of lower back pain that's been going on over the the past 2 days. This initially started as groin pain. She woke up at 0830,felt like she had cramps in her back, tried to stretch but pain persisted.   NS bolus given 400 CC and 200 mg of fentanyl PTA.  VSS per EMS.

## 2021-06-18 NOTE — ED Notes (Signed)
Attempted to call report to [phone] x1.

## 2021-06-18 NOTE — ED Provider Notes (Signed)
[name] EMERGENCY DEPARTMENT Provider Note   CSN: [id number] Arrival date & time: 06/18/21  1218     History Chief Complaint  Patient presents with   Back Pain    Samantha King is a 75 y.o. female history of CAD, cardiac arrest, GERD, hypertension, hyperlipidemia, peptic ulcer disease.  Patient reports Samantha King first developed left lower back pain around 3 days ago, Friday Samantha King felt a small twinge in her back Samantha King tried stretching without relief.  Samantha King described as a cramping, moderate intensity, constant, no clear alleviating factors.  Patient reports that on Saturday Samantha King went to the movies and sat in a chair for around 2 hours afterwards her back pain worsened.  It became severe yesterday Samantha King feels that Samantha King cannot move due to the pain.  Patient called EMS to bring her to the ER today.  Patient denies fever/chills, fall/injury, numbness testing, weakness, saddle paresthesias, bowel/bladder incontinence, urinary tension, history of IV drug use, chest pain/shortness of breath, cough/mopped assist, abdominal pain, hematuria or any additional concerns. HPI     Past Medical History:  Diagnosis Date   CAD (coronary artery disease)    a. s/p INF-LAT STEMI => s/p DES-RCA c/b VF arrest and cardiogenic shock   Cardiac arrest ([place]) 10/2012   STEMI with VF arrest x 2    GERD (gastroesophageal reflux disease)    H1N1 influenza 10/2012   Hx of echocardiogram    a. Echo 10/30/12: Moderate LVH, EF 55-60%, normal wall motion, PASP 45   Hyperlipidemia    Hypertension    Myocardial infarction ([place])    Pneumonia 10/2012   a. STEMI c/b LLL pneumonia in setting of recent H1N1 influenza   PUD (peptic ulcer disease)    Syncope 1996   reported episode while driving in [place] in 1996 with full cardiac workup = negative   Tobacco abuse     Patient Active Problem List   Diagnosis Date Noted   Upper GI bleed 03/10/2020   Nausea 03/10/2020   Duodenal ulcer    Dieulafoy lesion  of duodenum    Gastritis and gastroduodenitis    Coronary atherosclerosis of native coronary artery 11/10/2012   Tobacco abuse 11/09/2012   NSVT (nonsustained ventricular tachycardia) ([place]) 11/09/2012   Acute paranoia ([place]) 11/09/2012   Severe muscle deconditioning 11/09/2012   Hyperlipidemia 11/09/2012   Hypokalemia 11/05/2012   Acute blood loss anemia 11/01/2012   H1N1 influenza with pneumonia 10/31/2012   Hypertension 10/29/2012   GERD (gastroesophageal reflux disease) 10/29/2012   Acute respiratory failure ([place]) 10/29/2012   Cardiogenic shock ([place]) 10/29/2012   Cardiac arrest ([place]) 10/29/2012   ST elevation myocardial infarction (STEMI) of inferior wall ([place]) 10/29/2012    Past Surgical History:  Procedure Laterality Date   BREAST EXCISIONAL BIOPSY     left breast ? 1981   CORONARY ANGIOPLASTY WITH STENT PLACEMENT     s/p inferior STEMI c/b VF arrest and cardiogenic shock requiring IABP   ESOPHAGOGASTRODUODENOSCOPY (EGD) WITH PROPOFOL N/A 03/10/2020   Procedure: ESOPHAGOGASTRODUODENOSCOPY (EGD) WITH PROPOFOL;  Surgeon: Lavena Bullion, DO;  Location: [facility];  Service: Gastroenterology;  Laterality: N/A;   HEMOSTASIS CLIP PLACEMENT  03/10/2020   Procedure: HEMOSTASIS CLIP PLACEMENT;  Surgeon: Lavena Bullion, DO;  Location: [facility] ENDOSCOPY;  Service: Gastroenterology;;   HEMOSTASIS CONTROL  03/10/2020   Procedure: HEMOSTASIS CONTROL;  Surgeon: Lavena Bullion, DO;  Location: [facility] ENDOSCOPY;  Service: Gastroenterology;;  epi   INTRA-AORTIC BALLOON PUMP INSERTION  10/29/2012  Procedure: INTRA-AORTIC BALLOON PUMP INSERTION;  Surgeon: Burnell Blanks, MD;  Location: [facility] CATH LAB;  Service: Cardiovascular;;   LEFT HEART CATHETERIZATION WITH CORONARY ANGIOGRAM N/A 10/29/2012   Procedure: LEFT HEART CATHETERIZATION WITH CORONARY ANGIOGRAM;  Surgeon: Burnell Blanks, MD;  Location: [facility] CATH LAB;  Service: Cardiovascular;  Laterality: N/A;   PERCUTANEOUS CORONARY STENT  INTERVENTION (PCI-S)  10/29/2012   Procedure: PERCUTANEOUS CORONARY STENT INTERVENTION (PCI-S);  Surgeon: Burnell Blanks, MD;  Location: [facility] CATH LAB;  Service: Cardiovascular;;   TUBAL LIGATION       OB History   No obstetric history on file.     Family History  Problem Relation Age of Onset   Heart attack Brother    Stroke Maternal Grandmother    Colon cancer Neg Hx    Pancreatic cancer Neg Hx    Prostate cancer Neg Hx    Rectal cancer Neg Hx    Stomach cancer Neg Hx     Social History   Tobacco Use   Smoking status: Former    Packs/day: 1.00    Years: 20.00    Pack years: 20.00    Types: Cigarettes    Quit date: 11/05/2012    Years since quitting: 8.6   Smokeless tobacco: Never  Vaping Use   Vaping Use: Never used  Substance Use Topics   Alcohol use: No   Drug use: No    Home Medications Prior to Admission medications   Medication Sig Start Date End Date Taking? Authorizing Provider  acetaminophen (TYLENOL) 325 MG tablet Take 650 mg by mouth every 6 (six) hours as needed. For headache or pain     [provider]  atorvastatin (LIPITOR) 80 MG tablet Take 1 tablet (80 mg total) by mouth daily at 6 PM. 11/15/12   Love, Ivan Anchors, PA-C  clopidogrel (PLAVIX) 75 MG tablet Take 1 tablet (75 mg total) by mouth daily. 03/17/20   Ghimire, Henreitta Leber, MD  escitalopram (LEXAPRO) 5 MG tablet Take 5 mg by mouth daily.    [provider]  famotidine (PEPCID) 20 MG tablet Take 1 tablet (20 mg total) by mouth 2 (two) times daily. 05/02/20 08/15/20  Cirigliano, Vito V, DO  furosemide (LASIX) 40 MG tablet Take 40 mg by mouth daily. 07/23/20   [provider]  Javier Docker Oil 500 MG CAPS Take 1 capsule by mouth daily.    [provider]  lisinopril (ZESTRIL) 5 MG tablet Take 5 mg by mouth daily. 07/19/20   [provider]  metoprolol tartrate (LOPRESSOR) 25 MG tablet Take 1 tablet (25 mg total) by mouth 2 (two) times daily. 11/15/12   Love, Ivan Anchors,  PA-C  nitroGLYCERIN (NITROSTAT) 0.4 MG SL tablet DISSOLVE ONE TABLET UNDER THE TONGUE EVERY FIVE MINUTES AS NEEDED FOR CHEST PAIN. DO NOT EXCEED A TOTAL OF THREE DOSES IN 15 MINUTES 09/03/20   Burnell Blanks, MD  pantoprazole (PROTONIX) 40 MG tablet Take 1 tablet (40 mg total) by mouth daily. 12/10/20 06/08/21  Burnell Blanks, MD  PROAIR RESPICLICK [id number] Base) MCG/ACT AEPB Take 2 puffs by mouth every 6 (six) hours as needed for wheezing or shortness of breath. 12/27/19   [provider]  Probiotic Product (PROBIOTIC PO) Take 1 capsule by mouth daily.    [provider]  psyllium (REGULOID) 0.52 G capsule Take 0.52 g by mouth daily.    [provider]  SYMBICORT 160-4.5 MCG/ACT inhaler Inhale 2 puffs into the lungs daily. 01/13/20  [provider]    Allergies    Shellfish allergy, Codeine, and Lidocaine  Review of Systems   Review of Systems Ten systems are reviewed and are negative for acute change except as noted in the HPI  Physical Exam Updated Vital Signs BP 105/77 (BP Location: Left Arm)   Pulse 91   Temp 97.7 F (36.5 C) (Oral)   Resp (!) 22   SpO2 95%   Physical Exam Constitutional:      General: Samantha King is not in acute distress.    Appearance: Normal appearance. Samantha King is well-developed. Samantha King is not ill-appearing or diaphoretic.     Comments: Uncomfortable appearing  HENT:     Head: Normocephalic and atraumatic.  Eyes:     General: Vision grossly intact. Gaze aligned appropriately.     Pupils: Pupils are equal, round, and reactive to light.  Neck:     Trachea: Trachea and phonation normal.  Cardiovascular:     Pulses:          Dorsalis pedis pulses are 2+ on the right side and 2+ on the left side.  Pulmonary:     Effort: Pulmonary effort is normal. No respiratory distress.  Abdominal:     General: There is no distension.     Palpations: Abdomen is soft.     Tenderness: There is no abdominal tenderness. There is no  guarding or rebound.  Musculoskeletal:        General: Normal range of motion.     Cervical back: Normal range of motion.     Comments: Diffuse lumbar midline and paraspinal muscular tenderness to palpation without overlying skin change.  No cervical or thoracic midline spinal tenderness.  No crepitus step-off or deformity of the C/T/L-spine.  Pelvis stable to compression bilaterally.  Feet:     Right foot:     Protective Sensation: 3 sites tested.  3 sites sensed.     Left foot:     Protective Sensation: 3 sites tested.  3 sites sensed.  Skin:    General: Skin is warm and dry.  Neurological:     Mental Status: Samantha King is alert.     GCS: GCS eye subscore is 4. GCS verbal subscore is 5. GCS motor subscore is 6.     Comments: Speech is clear and goal oriented, follows commands Major Cranial nerves without deficit, no facial droop Moves extremities without ataxia, coordination intact Strong and equal grip strength.  5/5 strength with bilateral dorsi/plantar flexion.  No clonus of the feet.  Psychiatric:        Behavior: Behavior normal.    ED Results / Procedures / Treatments   Labs (all labs ordered are listed, but only abnormal results are displayed) Labs Reviewed  RESP PANEL BY RT-PCR (FLU A&B, COVID) ARPGX2  CBC WITH DIFFERENTIAL/PLATELET  BASIC METABOLIC PANEL  TROPONIN I (HIGH SENSITIVITY)    EKG None  Radiology No results found.  Procedures Procedures   Medications Ordered in ED Medications  fentaNYL (SUBLIMAZE) injection 50 mcg (50 mcg Intravenous Given 06/18/21 1317)    ED Course  I have reviewed the triage vital signs and the nursing notes.  Pertinent labs & imaging results that were available during my care of the patient were reviewed by me and considered in my medical decision making (see chart for details).    MDM Rules/Calculators/A&P  Additional history obtained from: Nursing notes from this visit. Family, husband at  bedside Review of electronic medical records. ------ 75 year old female presented for left low back pain onset 3 days ago, gradually worsening.  On exam Samantha King is tender diffusely to the lower back including midline.  Samantha King denies any neurologic complaint.  Samantha King denies any red flags concerning for cauda equina, AAA, spinal epidural abscess.  Patient has strong and equal distal pulses.  On arrival patient is on supplemental oxygen Samantha King denies use of oxygen in the past.  SPO2 on room air is upper 80s.  Case was discussed with Dr. Almyra Free.  Will obtain basic labs including CBC, BMP, troponin.  We will also obtain EKG and chest x-ray.  Plan is for CT angio of the chest to rule out PE and also for CT angio of the abdomen pelvis at this time. ---- Chest x-ray:  IMPRESSION:  1. Chronically prominent lung markings with mildly increased streaky  opacities in the right upper lung and bilateral lung bases, which  could represent atelectasis, aspiration, and or pneumonia.  2. Cardiomegaly.   CBC shows leukocytosis of 24.9, no anemia or thrombocytopenia. BMP shows mildly elevated creatinine at 1.15, no emergent lecture derangement or gap.  Case rediscussed with attending physician Dr. Almyra Free, possibility of hypoxia and back pain from pneumonia.  Labs ordered include lactic and blood cultures.  Patient ordered azithromycin and Rocephin for community-acquired pneumonia coverage.  Patient is currently awaiting CT imaging for further evaluation.  I reassessed the patient Samantha King is resting comfortably no acute distress, pain temporarily improved following fentanyl.   Care handoff given to Benedetto Goad, PA-C at shift change.  Plan of care is to continue monitoring the patient, follow-up on CT imaging.  Anticipate admission.  Note: Portions of this report may have been transcribed using voice recognition software. Every effort was made to ensure accuracy; however, inadvertent computerized transcription errors may still be  present.  Final Clinical Impression(s) / ED Diagnoses Final diagnoses:  None    Rx / DC Orders ED Discharge Orders     None        Gari Crown 06/18/21 1540    Luna Fuse, MD 06/22/21 1512

## 2021-06-18 NOTE — ED Notes (Signed)
Notified Kelsey, [place] of patients HR elevated in the 115-120s Sinus tachycardia with frequent PVC's. Pt still only complaining of lower back pain. Patient denies chest pain and SOB at this time, maintaining O2 sat from 94-98 percent of 3 L of O2 per [place]. No orders at this time.

## 2021-06-18 NOTE — ED Provider Notes (Signed)
Care assumed from [employer] at shift change, please see his note for full details, but in brief Samantha King is a 75 y.o. female presenting to For severe low back spasms over the past 3 days, but was noted to be hypoxic on arrival, improved on 3 L nasal cannula, no chronic oxygen requirement at home. Labs significant for mildly elevated troponin and leukocytosis of 24.9. Dissection study and CT L-spine pending, will need admission due to pneumonia and hypoxia.  BP 96/76 (BP Location: Left Arm)   Pulse (!) 101   Temp 97.7 F (36.5 C) (Oral)   Resp 18   SpO2 100%   ED Course/Procedures   Labs Reviewed  CBC WITH DIFFERENTIAL/PLATELET - Abnormal; Notable for the following components:      Result Value   WBC 24.9 (*)    RDW 17.6 (*)    Neutro Abs 21.0 (*)    Monocytes Absolute 2.9 (*)    Abs Immature Granulocytes 0.23 (*)    All other components within normal limits  BASIC METABOLIC PANEL - Abnormal; Notable for the following components:   CO2 17 (*)    Glucose, Bld 117 (*)    Creatinine, Ser 1.15 (*)    Calcium 8.5 (*)    GFR, Estimated 49 (*)    All other components within normal limits  TROPONIN I (HIGH SENSITIVITY) - Abnormal; Notable for the following components:   Troponin I (High Sensitivity) 47 (*)    All other components within normal limits  RESP PANEL BY RT-PCR (FLU A&B, COVID) ARPGX2  CULTURE, BLOOD (ROUTINE X 2)  CULTURE, BLOOD (ROUTINE X 2)  LACTIC ACID, PLASMA  LACTIC ACID, PLASMA  URINALYSIS, ROUTINE W REFLEX MICROSCOPIC  TROPONIN I (HIGH SENSITIVITY)   EKG Interpretation  Date/Time:  Tuesday June 18 2021 13:58:01 EDT Ventricular Rate:  91 PR Interval:  158 QRS Duration: 96 QT Interval:  384 QTC Calculation: 472 R Axis:   -31 Text Interpretation: Sinus rhythm with Fusion complexes and Premature atrial complexes with Abberant conduction Left axis deviation Low voltage QRS Anterolateral changes similar to May 2021 tracing Confirmed by Nanda Quinton [id number]) on 06/18/2021 3:43:36 PM   DG Chest 2 View  Result Date: 06/18/2021 CLINICAL DATA:  Back pain.  Hypoxia. EXAM: CHEST - 2 VIEW COMPARISON:  Mar 10, 2020. FINDINGS: Enlarged cardiac silhouette. Calcific atherosclerosis aorta. Chronically prominent lung markings with mildly increased streaky opacities in the right upper lung and bilateral lung bases. No visible pleural effusions or pneumothorax. Osteopenia. Visualized thoracic vertebral body heights are maintained. IMPRESSION: 1. Chronically prominent lung markings with mildly increased streaky opacities in the right upper lung and bilateral lung bases, which could represent atelectasis, aspiration, and or pneumonia. 2. Cardiomegaly. Electronically Signed   By: Margaretha Sheffield MD   On: 06/18/2021 14:12   CT L-SPINE NO CHARGE  Result Date: 06/18/2021 CLINICAL DATA:  Low back pain M54.50 (ICD-10-CM) EXAM: CT LUMBAR SPINE WITHOUT CONTRAST TECHNIQUE: Multidetector CT imaging of the lumbar spine was performed without intravenous contrast administration. Multiplanar CT image reconstructions were also generated. COMPARISON:  None. FINDINGS: Segmentation: Transitional lumbosacral anatomy. For the purposes of this dictation there is a partially sacralized L5 vertebral body. Small/hypoplastic ribs at T12. Alignment: Dextrocurvature centered at L2-L3. grade 1 anterolisthesis of L4 on L5 with right-sided L4 pars defect. Vertebrae: Bony fusion across the L5-S1 disc space. No evidence of acute fracture. Vertebral body heights are maintained. Osteopenia. Paraspinal and other soft tissues: Please see same day CTA  chest abdomen/pelvis for intra-abdominal and intrathoracic evaluation. Disc levels: Severe multilevel degenerative disease at T12-L1, L1-L2, L2-L3, L3-L4 and L4-L5 with disc height loss, endplate sclerosis, and vacuum disc phenomenon. At L4-L5 there is potentially severe canal stenosis at least moderate right foraminal stenosis. Multilevel endplate  spurring and severe facet hypertrophy with at least moderate left foraminal stenosis at L2-L3 and L3-L4. IMPRESSION: 1. Transitional lumbosacral anatomy, as detailed above. 2. Grade 1 anterolisthesis of L4 on L5 with right L4 pars defect and severe degenerative change with potentially severe canal stenosis and at least moderate right foraminal stenosis at this level. 3. Dextrocurvature centered at L2-L3 with severe multilevel degenerative change and at least moderate left foraminal stenosis at L2-L3 and L3-L4. 4. Given the above findings, recommend an MRI of the lumbar spine to better evaluate the canal and foramina. 5. No evidence of acute fracture or clearly traumatic malalignment. Electronically Signed   By: Margaretha Sheffield MD   On: 06/18/2021 19:19   CT Angio Chest/Abd/Pel for Dissection W and/or Wo Contrast  Result Date: 06/18/2021 CLINICAL DATA:  Chest pain.  Aortic dissection suspected. EXAM: CT ANGIOGRAPHY CHEST, ABDOMEN AND PELVIS TECHNIQUE: Non-contrast CT of the chest was initially obtained. Multidetector CT imaging through the chest, abdomen and pelvis was performed using the standard protocol during bolus administration of intravenous contrast. Multiplanar reconstructed images and MIPs were obtained and reviewed to evaluate the vascular anatomy. CONTRAST:  149m OMNIPAQUE IOHEXOL 350 MG/ML SOLN COMPARISON:  None. FINDINGS: CTA CHEST FINDINGS Cardiovascular: Pre contrast imaging shows no hyperdense crescent in the wall of the thoracic aorta to suggest acute intramural hematoma. Postcontrast imaging shows no dissection of the thoracic aorta. No thoracic aortic aneurysm. Ascending thoracic aorta measures 3.7 cm diameter. Moderate atherosclerotic calcification is noted in the wall of the thoracic aorta. No large central pulmonary embolus in the pulmonary outflow tract or main pulmonary arteries. No filling defect in the lobar pulmonary arteries to either lung to suggest pulmonary embolus.  Cardiopericardial silhouette is at upper limits of normal for size. Coronary artery calcification is evident. Mediastinum/Nodes: No mediastinal lymphadenopathy. There is no hilar lymphadenopathy. The esophagus has normal imaging features. There is no axillary lymphadenopathy. Lungs/Pleura: Centrilobular and paraseptal emphysema evident. Probable scarring anterior right upper lobe. 12 mm nodular opacity seen along the inferior margin of the apparent scarring (39/6). 2 cm irregular opacity noted posterior right costophrenic sulcus on 73/6. Atelectasis noted left lower lobe. 5 mm left lower lobe nodule evident on 59/6. 7 mm right middle lobe nodule visible on 67/6. No pleural effusion. Musculoskeletal: No worrisome lytic or sclerotic osseous abnormality. Review of the MIP images confirms the above findings. CTA ABDOMEN AND PELVIS FINDINGS VASCULAR Aorta: Normal caliber aorta without aneurysm, dissection, vasculitis or significant stenosis. Celiac: Patent without evidence of aneurysm, dissection, vasculitis or significant stenosis. SMA: Dense calcific plaque at the ostium with moderate luminal narrowing. No substantial post stenotic dilatation. No evidence for vasculitis or dissection. Renals: Both renal arteries are patent without evidence of aneurysm, dissection, vasculitis, fibromuscular dysplasia or significant stenosis. IMA: Patent without evidence of aneurysm, dissection, vasculitis or significant stenosis. Inflow: Patent without evidence of aneurysm, dissection, vasculitis or significant stenosis. Veins: No obvious venous abnormality within the limitations of this arterial phase study. Review of the MIP images confirms the above findings. NON-VASCULAR Hepatobiliary: No suspicious focal abnormality within the liver parenchyma. Gallbladder is distended. No intrahepatic or extrahepatic biliary dilation. Pancreas: No focal mass lesion. No dilatation of the main duct. No intraparenchymal cyst. No peripancreatic  edema.  Spleen: No splenomegaly. No focal mass lesion. Adrenals/Urinary Tract: Thickening of the right adrenal gland noted. Multiple left adrenal nodules measure up to 16 mm in size, although attenuation too high to allow definitive characterization as adenomas. No suspicious enhancing renal mass lesion. 2 mm nonobstructing stone noted upper pole left kidney. No evidence for hydroureter. The urinary bladder appears normal for the degree of distention. Stomach/Bowel: Stomach is nondistended. Duodenum is normally positioned as is the ligament of Treitz. No small bowel wall thickening. No small bowel dilatation. The cecum is mobile, positioned in the anterior midline abdomen. The terminal ileum is normal. The appendix is normal. Diverticular changes are noted in the left colon without evidence of diverticulitis. Lymphatic: There is no gastrohepatic or hepatoduodenal ligament lymphadenopathy. No retroperitoneal or mesenteric lymphadenopathy. No pelvic sidewall lymphadenopathy. Reproductive: The uterus is unremarkable.  There is no adnexal mass. Other: No intraperitoneal free fluid. Musculoskeletal: No worrisome lytic or sclerotic osseous abnormality. Degenerative disc disease noted lumbar spine. Review of the MIP images confirms the above findings. IMPRESSION: 1. No evidence for thoracoabdominal aortic aneurysm or dissection. No acute intramural hematoma of the thoracic aorta. 2. No large central pulmonary embolus. 3. Multiple bilateral pulmonary nodules measuring up to 2 cm. These larger nodules may reflect infectious/inflammatory etiology. Follow-up CT chest in 3 months after therapy recommended. 4. Moderate narrowing at the ostium of the SMA. 5. Multiple left adrenal nodules measuring up to 16 mm in size, although attenuation is too high to allow definitive characterization as adenomas. This could be reassessed at the time of follow-up chest CT or abdominal MRI could be used for subsequent more definitive characterization as  warranted. 6. 2 mm nonobstructing left renal stone. 7. Left colonic diverticulosis without diverticulitis. 8. Aortic Atherosclerosis (ICD10-I70.0) and Emphysema (ICD10-J43.9). Electronically Signed   By: Misty Stanley M.D.   On: 06/18/2021 18:00     Procedures  MDM   Fortunatley CT negative for dissection or PE, multiple pulmonary nodules noted which may be infectious versus inflammatory.  CT lumbar with significant degenerative disc disease with some canal stenosis, MRI recommended.  Troponins not significantly increasing. Lactic minimally elevated at 2.0, improving with fluids.  Pt noted to be tachycardic, may be due to pain from severe back spasms, pain has been difficult to control with medications, infections may also be contributing to tachycardia, no PE on CTA. EKG shows Sinus tach with PVCs. Will check mag and TSH as well given frequent ectopy.  MRI L spine ordered.  Case discussed with Dr. Marlowe Sax with [facility], who will see and admit the patient.       Jacqlyn Larsen, PA-C 06/18/21 2331    Margette Fast, MD 06/21/21 [id number]

## 2021-06-19 ENCOUNTER — Encounter (HOSPITAL_COMMUNITY): Payer: Self-pay | Admitting: Internal Medicine

## 2021-06-19 ENCOUNTER — Other Ambulatory Visit: Payer: Self-pay

## 2021-06-19 DIAGNOSIS — R6521 Severe sepsis with septic shock: Secondary | ICD-10-CM

## 2021-06-19 DIAGNOSIS — R7881 Bacteremia: Secondary | ICD-10-CM | POA: Diagnosis not present

## 2021-06-19 DIAGNOSIS — J9601 Acute respiratory failure with hypoxia: Secondary | ICD-10-CM | POA: Diagnosis not present

## 2021-06-19 DIAGNOSIS — R918 Other nonspecific abnormal finding of lung field: Secondary | ICD-10-CM

## 2021-06-19 DIAGNOSIS — J441 Chronic obstructive pulmonary disease with (acute) exacerbation: Secondary | ICD-10-CM

## 2021-06-19 DIAGNOSIS — M545 Low back pain, unspecified: Secondary | ICD-10-CM | POA: Diagnosis not present

## 2021-06-19 DIAGNOSIS — A419 Sepsis, unspecified organism: Secondary | ICD-10-CM

## 2021-06-19 DIAGNOSIS — M549 Dorsalgia, unspecified: Secondary | ICD-10-CM | POA: Diagnosis present

## 2021-06-19 LAB — BLOOD CULTURE ID PANEL (REFLEXED) - BCID2

## 2021-06-19 LAB — BASIC METABOLIC PANEL
Anion gap: 11 (ref 5–15)
BUN: 19 mg/dL (ref 8–23)
CO2: 20 mmol/L — ABNORMAL LOW (ref 22–32)
Calcium: 7.6 mg/dL — ABNORMAL LOW (ref 8.9–10.3)
Chloride: 104 mmol/L (ref 98–111)
Creatinine, Ser: 1.03 mg/dL — ABNORMAL HIGH (ref 0.44–1.00)
GFR, Estimated: 56 mL/min — ABNORMAL LOW (ref >60)
Glucose, Bld: 139 mg/dL — ABNORMAL HIGH (ref 70–99)
Potassium: 3.7 mmol/L (ref 3.5–5.1)
Sodium: 135 mmol/L (ref 135–145)

## 2021-06-19 LAB — BLOOD GAS, ARTERIAL
Acid-base deficit: 5.6 mmol/L — ABNORMAL HIGH (ref 0.0–2.0)
Bicarbonate: 20.2 mmol/L (ref 20.0–28.0)
Drawn by: 51155
FIO2: 100
O2 Saturation: 95.7 %
Patient temperature: 36.6
pCO2 arterial: 45.3 mmHg (ref 32.0–48.0)
pH, Arterial: 7.27 — ABNORMAL LOW (ref 7.350–7.450)
pO2, Arterial: 89.9 mmHg (ref 83.0–108.0)

## 2021-06-19 LAB — CBC
HCT: 38.6 % (ref 36.0–46.0)
Hemoglobin: 12.1 g/dL (ref 12.0–15.0)
MCH: 28.5 pg (ref 26.0–34.0)
MCHC: 31.3 g/dL (ref 30.0–36.0)
MCV: 90.8 fL (ref 80.0–100.0)
Platelets: 238 10*3/uL (ref 150–400)
RBC: 4.25 MIL/uL (ref 3.87–5.11)
RDW: 17.9 % — ABNORMAL HIGH (ref 11.5–15.5)
WBC: 24.9 10*3/uL — ABNORMAL HIGH (ref 4.0–10.5)
nRBC: 0 % (ref 0.0–0.2)

## 2021-06-19 LAB — C-REACTIVE PROTEIN: CRP: 40.1 mg/dL — ABNORMAL HIGH (ref <1.0)

## 2021-06-19 LAB — SEDIMENTATION RATE: Sed Rate: 35 mm/hr — ABNORMAL HIGH (ref 0–22)

## 2021-06-19 LAB — PROCALCITONIN: Procalcitonin: 3.28 ng/mL

## 2021-06-19 MED ORDER — CEFAZOLIN SODIUM-DEXTROSE 2-4 GM/100ML-% IV SOLN
2.0000 g | Freq: Three times a day (TID) | INTRAVENOUS | Status: DC
  Administered 2021-06-19 – 2021-06-21 (×6): 2 g via INTRAVENOUS
  Filled 2021-06-19 (×11): qty 100

## 2021-06-19 MED ORDER — METHYLPREDNISOLONE SODIUM SUCC 125 MG IJ SOLR
60.0000 mg | Freq: Two times a day (BID) | INTRAMUSCULAR | Status: DC
  Administered 2021-06-19 (×2): 60 mg via INTRAVENOUS
  Filled 2021-06-19 (×2): qty 2

## 2021-06-19 MED ORDER — OXYCODONE HCL 5 MG PO TABS
5.0000 mg | ORAL_TABLET | ORAL | Status: DC | PRN
  Administered 2021-06-19 – 2021-06-25 (×14): 5 mg via ORAL
  Filled 2021-06-19 (×15): qty 1

## 2021-06-19 MED ORDER — OXYCODONE-ACETAMINOPHEN 5-325 MG PO TABS
1.0000 | ORAL_TABLET | Freq: Once | ORAL | Status: AC | PRN
  Administered 2021-06-19: 1 via ORAL
  Filled 2021-06-19: qty 1

## 2021-06-19 MED ORDER — METHOCARBAMOL 500 MG PO TABS
500.0000 mg | ORAL_TABLET | Freq: Three times a day (TID) | ORAL | Status: DC | PRN
  Administered 2021-06-19 (×2): 500 mg via ORAL
  Filled 2021-06-19 (×2): qty 1

## 2021-06-19 MED ORDER — BUDESONIDE 0.25 MG/2ML IN SUSP
0.2500 mg | Freq: Two times a day (BID) | RESPIRATORY_TRACT | Status: DC
  Administered 2021-06-19 – 2021-06-22 (×7): 0.25 mg via RESPIRATORY_TRACT
  Filled 2021-06-19 (×7): qty 2

## 2021-06-19 MED ORDER — CYCLOBENZAPRINE HCL 5 MG PO TABS
7.5000 mg | ORAL_TABLET | Freq: Three times a day (TID) | ORAL | Status: DC
  Administered 2021-06-19 – 2021-06-20 (×3): 7.5 mg via ORAL
  Filled 2021-06-19 (×4): qty 1.5

## 2021-06-19 MED ORDER — HYDROMORPHONE HCL 1 MG/ML IJ SOLN
1.0000 mg | INTRAMUSCULAR | Status: DC | PRN
  Administered 2021-06-19 – 2021-06-20 (×4): 1 mg via INTRAVENOUS
  Filled 2021-06-19 (×4): qty 1

## 2021-06-19 MED ORDER — DEXTROSE-NACL 5-0.9 % IV SOLN: INTRAVENOUS | Status: DC

## 2021-06-19 MED ORDER — ALPRAZOLAM 0.5 MG PO TABS
0.5000 mg | ORAL_TABLET | Freq: Three times a day (TID) | ORAL | Status: DC | PRN
  Administered 2021-06-22 – 2021-06-24 (×2): 0.5 mg via ORAL
  Filled 2021-06-19 (×3): qty 1

## 2021-06-19 MED ORDER — ACETAMINOPHEN 325 MG PO TABS
650.0000 mg | ORAL_TABLET | Freq: Four times a day (QID) | ORAL | Status: DC | PRN
  Administered 2021-06-19: 650 mg via ORAL
  Filled 2021-06-19: qty 2

## 2021-06-19 MED ORDER — IPRATROPIUM-ALBUTEROL 0.5-2.5 (3) MG/3ML IN SOLN
3.0000 mL | Freq: Four times a day (QID) | RESPIRATORY_TRACT | Status: DC
  Administered 2021-06-19 (×2): 3 mL via RESPIRATORY_TRACT
  Filled 2021-06-19: qty 3

## 2021-06-19 MED ORDER — KETOROLAC TROMETHAMINE 15 MG/ML IJ SOLN: 15.0000 mg | Freq: Once | INTRAMUSCULAR | Status: DC | PRN

## 2021-06-19 NOTE — Progress Notes (Signed)
PT Cancellation Note  Patient Details Name: Samantha King MRN: [id number] DOB: Mar 11, 1946   Cancelled Treatment:    Reason Eval/Treat Not Completed: Pain limiting ability to participate. Will follow-up for PT Evaluation as schedule permits.  Mabeline Caras, PT, DPT Acute Rehabilitation Services  Pager [phone] Office [place] 06/19/2021, 4:04 PM

## 2021-06-19 NOTE — Evaluation (Addendum)
Occupational Therapy Evaluation Patient Details Name: Samantha King MRN: [id number] DOB: [birth year] Today's Date: 06/19/2021    History of Present Illness Pt is a 75 y.o. female admitted 06/18/21 with c/o lowe back pain with associated muscle spasms; found to be hypoxic in ED, suspect related to receiving opiates. CXR concerning for atelectasis, aspiration and/or PNA. Chest CT angiogram showing pulmonary and adrenal nodules, L renal stone. Lumbar CT shows multilevel degenerative changes, potential canal stenosis at L4-5, moderate L foraminal stenosis L2-4. Further workup revealed MSSA, suspect for vertebral infection; awaiting MRI. Also with concern for L knee septic joint; awaiting ortho consult. PMH includes CAD, HTN, MI, PNA, PUD, tobacco use.   Clinical Impression   Pt admitted for concerns listed above. PTA pt reported that she was independent with all ADL's and IADL's, using no DME until her pain started 3 days ago. At this time, pt presents with increased pain, limiting all ADL's and functional mobility. Pt was able to assist with first sit<>stand pivot transfer to recliner, however, after 2 mins when attempting to transfer back to bed, pt was unable to provide assist to come to stand, even using the stedy. It required total A +2-3 to assist pt back to bed. At this time, pt will benefit from SNF level therapies to maximize her recovery, however if pt improves medically and pain relieved, she may be able to go home with [facility]. OT will follow acutely to assist in addressing deficits below and updating POC as needed.     Follow Up Recommendations  SNF;Supervision/Assistance - 24 hour (As antibiotics take effect pt may be able to go home with [facility])    Equipment Recommendations  3 in 1 bedside commode    Recommendations for Other Services       Precautions / Restrictions Precautions Precautions: Fall Restrictions Weight Bearing Restrictions: No      Mobility Bed Mobility Overal bed  mobility: Needs Assistance Bed Mobility: Supine to Sit;Sit to Supine     Supine to sit: Max assist;+2 for physical assistance;+2 for safety/equipment;HOB elevated Sit to supine: Total assist;+2 for physical assistance;+2 for safety/equipment   General bed mobility comments: Pt limited by pain    Transfers Overall transfer level: Needs assistance Equipment used: Rolling walker (2 wheeled);Ambulation equipment used Transfers: Sit to/from [facility] Sit to Stand: Max assist;+2 physical assistance;+2 safety/equipment;Total assist Stand pivot transfers: Max assist;Total assist;+2 safety/equipment;+2 physical assistance       General transfer comment: Initial stand pivot to recliner, pt was able to provide some assist, on the way back to bed, pt provided no assist, required stedy to assist with transfer    Balance Overall balance assessment: Needs assistance Sitting-balance support: Bilateral upper extremity supported;Feet supported Sitting balance-Leahy Scale: Poor     Standing balance support: Bilateral upper extremity supported Standing balance-Leahy Scale: Poor Standing balance comment: At times balance even zero due to being unable to come to standing on her own                           ADL either performed or assessed with clinical judgement   ADL Overall ADL's : Needs assistance/impaired                                       General ADL Comments: Due to severe pain, all ADL's are requiring max-total A +1-2 and  sometimes +3. Pt is unable to tolerate much moving.     Vision Baseline Vision/History: Wears glasses Wears Glasses: Reading only Patient Visual Report: No change from baseline Vision Assessment?: No apparent visual deficits     Perception Perception Perception Tested?: No   Praxis Praxis Praxis tested?: Not tested    Pertinent Vitals/Pain Pain Assessment: 0-10 Pain Score: 10-Worst pain ever Pain Location:  back and L knee Pain Descriptors / Indicators: Aching;Constant;Crying;Discomfort;Grimacing;Guarding;Jabbing Pain Intervention(s): Limited activity within patient's tolerance;Premedicated before session;Monitored during session;Repositioned     Hand Dominance Right   Extremity/Trunk Assessment Upper Extremity Assessment Upper Extremity Assessment: Generalized weakness   Lower Extremity Assessment Lower Extremity Assessment: Defer to PT evaluation   Cervical / Trunk Assessment Cervical / Trunk Assessment: Kyphotic;Other exceptions   Communication Communication Communication: No difficulties   Cognition Arousal/Alertness: Awake/alert Behavior During Therapy: WFL for tasks assessed/performed Overall Cognitive Status: Within Functional Limits for tasks assessed                                     General Comments  Bilateral knees red, O2 reading from 75-100% pleth not stable, pt was wheezing at times and breathing normal other times.    Exercises     Shoulder Instructions      Home Living Family/patient expects to be discharged to:: Private residence Living Arrangements: Spouse/significant other Available Help at Discharge: Family;Available PRN/intermittently Type of Home: House Home Access: Stairs to enter [employer] of Steps: 3 steps Entrance Stairs-Rails: Can reach both Home Layout: One level     Bathroom Shower/Tub: Occupational psychologist: Standard Bathroom Accessibility: No   Home Equipment: Environmental consultant - 2 wheels;Shower seat - built in          Prior Functioning/Environment Level of Independence: Independent        Comments: Pt used no AD prior to increased pain in back, past 3 days she has been using a RW for mobility        OT Problem List: Decreased strength;Decreased range of motion;Decreased activity tolerance;Impaired balance (sitting and/or standing);Decreased coordination;Decreased safety awareness;Decreased  knowledge of use of DME or AE;Cardiopulmonary status limiting activity;Pain;Impaired UE functional use      OT Treatment/Interventions: Self-care/ADL training;Therapeutic exercise;Energy conservation;DME and/or AE instruction;Therapeutic activities;Cognitive remediation/compensation;Patient/family education;Balance training    OT Goals(Current goals can be found in the care plan section) Acute Rehab OT Goals Patient Stated Goal: To stop the pain OT Goal Formulation: With patient/family Time For Goal Achievement: 07/03/21 Potential to Achieve Goals: Good ADL Goals Pt Will Perform Grooming: with set-up;sitting Pt Will Perform Upper Body Bathing: with min guard assist;sitting Pt Will Perform Lower Body Bathing: with min assist;sitting/lateral leans;sit to/from stand Pt Will Perform Upper Body Dressing: with min guard assist;sitting Pt Will Perform Lower Body Dressing: with min assist;sitting/lateral leans;sit to/from stand Pt Will Transfer to Toilet: with min assist;stand pivot transfer Pt Will Perform Toileting - Clothing Manipulation and hygiene: with min assist;sitting/lateral leans;sit to/from stand  OT Frequency: Min 2X/week   Barriers to D/C:            Co-evaluation              AM-PAC OT "6 Clicks" Daily Activity     Outcome Measure Help from another person eating meals?: A Little Help from another person taking care of personal grooming?: A Little Help from another person toileting, which includes using toliet, bedpan, or urinal?:  Total Help from another person bathing (including washing, rinsing, drying)?: Total Help from another person to put on and taking off regular upper body clothing?: A Lot Help from another person to put on and taking off regular lower body clothing?: Total 6 Click Score: 11   End of Session Equipment Utilized During Treatment: Rolling walker;Oxygen (stedy) Nurse Communication: Mobility status;Patient requests pain meds;Need for lift  equipment  Activity Tolerance: Patient limited by pain Patient left: in bed;with call bell/phone within reach;with bed alarm set;with family/visitor present;with nursing/sitter in room  OT Visit Diagnosis: Unsteadiness on feet (R26.81);Other abnormalities of gait and mobility (R26.89);Muscle weakness (generalized) (M62.81);Pain Pain - Right/Left: Left Pain - part of body: Knee (back)                Time: [id number] OT Time Calculation (min): 85 min Charges:  OT General Charges $OT Visit: 1 Visit OT Evaluation $OT Eval Moderate Complexity: 1 Mod OT Treatments $Self Care/Home Management : 68-82 mins  Senai Kingsley H., OTR/L Acute Rehabilitation  Samantha King Samantha King 06/19/2021, 11:35 AM

## 2021-06-19 NOTE — Progress Notes (Signed)
Received a call regarding the patient unable to tolerate MRI or laying down flat despite pain medication and anxiolytic.  She may be amenable to MRI under anesthesia.  This is only performed on Tuesdays and Thursdays at [facility].    Deferring to day shift attending to make final decision and to place appropriate order.

## 2021-06-19 NOTE — Progress Notes (Signed)
   06/19/21 0045  Assess: MEWS Score  Temp 97.7 F (36.5 C)  BP (!) 161/72  Pulse Rate (!) 118  ECG Heart Rate (!) 118  Resp 20  SpO2 100 %  O2 Device Non-rebreather Mask  O2 Flow Rate (L/min) 15 L/min  Assess: MEWS Score  MEWS Temp 0  MEWS Systolic 0  MEWS Pulse 2  MEWS RR 0  MEWS LOC 0  MEWS Score 2  MEWS Score Color Yellow  Assess: if the MEWS score is Yellow or Red  Were vital signs taken at a resting state? Yes  Focused Assessment No change from prior assessment  Early Detection of Sepsis Score *See Row Information* Medium  MEWS guidelines implemented *See Row Information* Yes  Take Vital Signs  Increase Vital Sign Frequency  Yellow: Q 2hr X 2 then Q 4hr X 2, if remains yellow, continue Q 4hrs  Escalate  MEWS: Escalate Yellow: discuss with charge nurse/RN and consider discussing with provider and RRT  Notify: Charge Nurse/RN  Name of Charge Nurse/RN Notified Deborah, RN  Date Charge Nurse/RN Notified 06/19/21  Time Charge Nurse/RN Notified 0105  Notify: Provider  Provider Name/Title Rathore, MD  Date Provider Notified 06/19/21  Time Provider Notified [phone]  Notification Type Page  Notification Reason Other (Comment) (Pain level and O2 Sats)  Provider response At bedside;See new orders  Date of Provider Response 06/19/21  Time of Provider Response 0040  Document  Patient Outcome Other (Comment) (Pt stable on NRB and Robaxin given)  Progress note created (see row info) Yes
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
## 2021-06-19 NOTE — Progress Notes (Signed)
PROGRESS NOTE    Samantha King  [id number] DOB: Apr 08, 1946 DOA: 06/18/2021 PCP: Haywood Pao, MD    Brief Narrative:  Samantha King was admitted to the hospital with a working diagnosis of low back pain in the setting of sepsis/pulmonary nodules.  75 year old female past medical history for coronary artery disease, hypertension, dyslipidemia, gastritis and GERD who presented with lower back pain.  Reported 2 days back pain, severe in intensity, no fevers or chills.  Because of persistent pain she came to the hospital, she was found hypoxic, oxygen saturation 80% on room air.  Her blood pressure 149/91, heart rate 109, respirate 20, temperature 97.7, she was tachycardic, lungs with diffuse bilateral wheezing, abdomen soft nontender, no lower extremity edema.  Lumbar CT showing arterial listhesis L4-L5.  Severe degenerative joint changes.  Severe canal stenosis.  She was placed on antibiotic therapy for pulmonary nodules, possible infection. Placed on bronchodilator therapy for COPD exacerbation.  1 bottle blood culture positive for Staph aureus.  Assessment & Plan:   Principal Problem:   MSSA bacteremia Active Problems:   Back pain   Sepsis ([place])   COPD with acute exacerbation ([place])   Acute hypoxemic respiratory failure ([facility])   Pulmonary nodules   Severe back pain, acute on chronic. (Positive staph bacteremia) sepsis. Patient no on as needed hydromorphone and oxycodone, pain is better controlled but not yet back to baseline. Pending spine MRI for further workup.  One bottle with staph aureus, placed on antibiotic therapy, pending further imaging with spine MRI Continue antibiotic therapy with cefazolin and add IV fluids.   2. Pulmonary nodules. Possible septic in nature, will need echocardiogram for further workup. Continue antibiotic therapy,   3. Acute hypoxemic respiratory failure due to COPD exacerbation. Continue bronchodilator therapy, and supplemental 02  per [place] Hold on steroids in the setting of possible bacteremia,   4. HTN/ dyslipidemia/ CAD. Continue blood pressure control with metoprolol. Hold on lisinopril and furosemide  Continue with atorvastatin  Holding on clopidogrel for now, until results from spine MRI   5. Depression on lexapro   Patient continue to be at high risk for worsening bacteremia   Status is: Inpatient  Remains inpatient appropriate because:Inpatient level of care appropriate due to severity of illness  Dispo: The patient is from: Home              Anticipated d/c is to: Home              Patient currently is not medically stable to d/c.   Difficult to place patient No   DVT prophylaxis: Scd   Code Status:   full  Family Communication:  No family at the bedside      Consultants:  ID   Antimicrobials:  Cefazolin     Subjective: Patient with severe back pain this am, improved with IV analgesics but not yet back to baseline, no nausea or vomiting, improved dyspnea,   Objective: Vitals:   06/19/21 1100 06/19/21 1157 06/19/21 1323 06/19/21 1616  BP: (!) 98/59 (!) 98/59 (!) 98/59 117/75  Pulse: 81 71 71 84  Resp: '16 16 16 16  '$ Temp: 98 F (36.7 C) 98 F (36.7 C) 98 F (36.7 C) 98 F (36.7 C)  TempSrc: Oral   Oral  SpO2: 91%   92%  Weight:      Height:        Intake/Output Summary (Last 24 hours) at 06/19/2021 1718 Last data filed at 06/19/2021 0139 Gross per 24  hour  Intake 1620 ml  Output --  Net 1620 ml   Filed Weights   06/18/21 2349  Weight: 84.8 kg    Examination:   General: Not in pain or dyspnea, deconditioned  Neurology: Awake and alert, non focal  E ENT: no pallor, no icterus, oral mucosa moist Cardiovascular: No JVD. S1-S2 present, rhythmic, no gallops, rubs, or murmurs. No lower extremity edema. Pulmonary: positive breath sounds bilaterally, adequate air movement, no wheezing, rhonchi or rales. Gastrointestinal. Abdomen soft and non tender Skin. No  rashes Musculoskeletal: no joint deformities     Data Reviewed: I have personally reviewed following labs and imaging studies  CBC: Recent Labs  Lab 06/18/21 1412 06/19/21 0322  WBC 24.9* 24.9*  NEUTROABS 21.0*  --   HGB 13.6 12.1  HCT 43.7 38.6  MCV 90.5 90.8  PLT 231 [id number]   Basic Metabolic Panel: Recent Labs  Lab 06/18/21 1412 06/18/21 1805 06/19/21 0322  NA 137  --  135  K 3.6  --  3.7  CL 106  --  104  CO2 17*  --  20*  GLUCOSE 117*  --  139*  BUN 23  --  19  CREATININE 1.15*  --  1.03*  CALCIUM 8.5*  --  7.6*  MG  --  2.0  --    GFR: Estimated Creatinine Clearance: 47.2 mL/min (A) (by C-G formula based on SCr of 1.03 mg/dL (H)). Liver Function Tests: No results for input(s): AST, ALT, ALKPHOS, BILITOT, PROT, ALBUMIN in the last 168 hours. No results for input(s): LIPASE, AMYLASE in the last 168 hours. No results for input(s): AMMONIA in the last 168 hours. Coagulation Profile: No results for input(s): INR, PROTIME in the last 168 hours. Cardiac Enzymes: No results for input(s): CKTOTAL, CKMB, CKMBINDEX, TROPONINI in the last 168 hours. BNP (last 3 results) No results for input(s): PROBNP in the last 8760 hours. HbA1C: No results for input(s): HGBA1C in the last 72 hours. CBG: No results for input(s): GLUCAP in the last 168 hours. Lipid Profile: No results for input(s): CHOL, HDL, LDLCALC, TRIG, CHOLHDL, LDLDIRECT in the last 72 hours. Thyroid Function Tests: Recent Labs    06/18/21 1805  TSH 0.793   Anemia Panel: No results for input(s): VITAMINB12, FOLATE, FERRITIN, TIBC, IRON, RETICCTPCT in the last 72 hours.    Radiology Studies: I have reviewed all of the imaging during this hospital visit personally     Scheduled Meds:  atorvastatin  80 mg Oral q1800   budesonide (PULMICORT) nebulizer solution  0.25 mg Nebulization BID   cyclobenzaprine  7.5 mg Oral TID   escitalopram  5 mg Oral Daily   methylPREDNISolone (SOLU-MEDROL) injection  60  mg Intravenous Q12H   metoprolol tartrate  25 mg Oral BID   pantoprazole  40 mg Oral Daily   Continuous Infusions:   ceFAZolin (ANCEF) IV 2 g (06/19/21 1541)     LOS: 1 day        Sydni Elizarraraz Gerome Apley, MD

## 2021-06-19 NOTE — Progress Notes (Signed)
PHARMACY - PHYSICIAN COMMUNICATION CRITICAL VALUE ALERT - BLOOD CULTURE IDENTIFICATION (BCID)  Samantha King is an 75 y.o. female who presented to [facility] on 06/18/2021 with sepsis and possible PNA  Assessment:  Sepsis and possible PNA. Blood cultures show GPC 2/2 and BCID with staph aureus (no resistance)  Current antibiotics: Ancef 2gm IV q8h  Changes to prescribed antibiotics recommended:  Patient is on recommended antibiotics - No changes needed  Results for orders placed or performed during the hospital encounter of 06/18/21  Blood Culture ID Panel (Reflexed) (Collected: 06/18/2021  3:30 PM)  Result Value Ref Range   Enterococcus faecalis NOT DETECTED NOT DETECTED   Enterococcus Faecium NOT DETECTED NOT DETECTED   Listeria monocytogenes NOT DETECTED NOT DETECTED   Staphylococcus species DETECTED (A) NOT DETECTED   Staphylococcus aureus (BCID) DETECTED (A) NOT DETECTED   Staphylococcus epidermidis NOT DETECTED NOT DETECTED   Staphylococcus lugdunensis NOT DETECTED NOT DETECTED   Streptococcus species NOT DETECTED NOT DETECTED   Streptococcus agalactiae NOT DETECTED NOT DETECTED   Streptococcus pneumoniae NOT DETECTED NOT DETECTED   Streptococcus pyogenes NOT DETECTED NOT DETECTED   A.calcoaceticus-baumannii NOT DETECTED NOT DETECTED   Bacteroides fragilis NOT DETECTED NOT DETECTED   Enterobacterales NOT DETECTED NOT DETECTED   Enterobacter cloacae complex NOT DETECTED NOT DETECTED   Escherichia coli NOT DETECTED NOT DETECTED   Klebsiella aerogenes NOT DETECTED NOT DETECTED   Klebsiella oxytoca NOT DETECTED NOT DETECTED   Klebsiella pneumoniae NOT DETECTED NOT DETECTED   Proteus species NOT DETECTED NOT DETECTED   Salmonella species NOT DETECTED NOT DETECTED   Serratia marcescens NOT DETECTED NOT DETECTED   Haemophilus influenzae NOT DETECTED NOT DETECTED   Neisseria meningitidis NOT DETECTED NOT DETECTED   Pseudomonas aeruginosa NOT DETECTED NOT DETECTED    Stenotrophomonas maltophilia NOT DETECTED NOT DETECTED   Candida albicans NOT DETECTED NOT DETECTED   Candida auris NOT DETECTED NOT DETECTED   Candida glabrata NOT DETECTED NOT DETECTED   Candida krusei NOT DETECTED NOT DETECTED   Candida parapsilosis NOT DETECTED NOT DETECTED   Candida tropicalis NOT DETECTED NOT DETECTED   Cryptococcus neoformans/gattii NOT DETECTED NOT DETECTED   Meth resistant mecA/C and MREJ NOT DETECTED NOT DETECTED    Hildred Laser, PharmD Clinical Pharmacist **Pharmacist phone directory can now be found on [email or web address] ([email or web address]).  Listed under [place].

## 2021-06-20 ENCOUNTER — Inpatient Hospital Stay (HOSPITAL_COMMUNITY): Payer: Medicare Other

## 2021-06-20 ENCOUNTER — Other Ambulatory Visit (HOSPITAL_COMMUNITY): Payer: Medicare Other

## 2021-06-20 ENCOUNTER — Other Ambulatory Visit (HOSPITAL_COMMUNITY): Payer: Self-pay

## 2021-06-20 DIAGNOSIS — J441 Chronic obstructive pulmonary disease with (acute) exacerbation: Secondary | ICD-10-CM | POA: Diagnosis not present

## 2021-06-20 DIAGNOSIS — A4189 Other specified sepsis: Secondary | ICD-10-CM

## 2021-06-20 DIAGNOSIS — M545 Low back pain, unspecified: Secondary | ICD-10-CM

## 2021-06-20 DIAGNOSIS — R918 Other nonspecific abnormal finding of lung field: Secondary | ICD-10-CM | POA: Diagnosis not present

## 2021-06-20 DIAGNOSIS — A419 Sepsis, unspecified organism: Secondary | ICD-10-CM | POA: Diagnosis not present

## 2021-06-20 DIAGNOSIS — R6521 Severe sepsis with septic shock: Secondary | ICD-10-CM | POA: Diagnosis not present

## 2021-06-20 DIAGNOSIS — R0902 Hypoxemia: Secondary | ICD-10-CM

## 2021-06-20 DIAGNOSIS — R579 Shock, unspecified: Secondary | ICD-10-CM

## 2021-06-20 DIAGNOSIS — I480 Paroxysmal atrial fibrillation: Secondary | ICD-10-CM

## 2021-06-20 DIAGNOSIS — R7881 Bacteremia: Secondary | ICD-10-CM | POA: Diagnosis not present

## 2021-06-20 LAB — CBC WITH DIFFERENTIAL/PLATELET
Abs Immature Granulocytes: 0.31 10*3/uL — ABNORMAL HIGH (ref 0.00–0.07)
Basophils Absolute: 0 10*3/uL (ref 0.0–0.1)
Basophils Relative: 0 %
Eosinophils Absolute: 0 10*3/uL (ref 0.0–0.5)
Eosinophils Relative: 0 %
HCT: 37.3 % (ref 36.0–46.0)
Hemoglobin: 12 g/dL (ref 12.0–15.0)
Immature Granulocytes: 1 %
Lymphocytes Relative: 4 %
Lymphs Abs: 1.1 10*3/uL (ref 0.7–4.0)
MCH: 29 pg (ref 26.0–34.0)
MCHC: 32.2 g/dL (ref 30.0–36.0)
MCV: 90.1 fL (ref 80.0–100.0)
Monocytes Absolute: 2 10*3/uL — ABNORMAL HIGH (ref 0.1–1.0)
Monocytes Relative: 7 %
Neutro Abs: 24 10*3/uL — ABNORMAL HIGH (ref 1.7–7.7)
Neutrophils Relative %: 88 %
Platelets: 288 10*3/uL (ref 150–400)
RBC: 4.14 MIL/uL (ref 3.87–5.11)
RDW: 17.9 % — ABNORMAL HIGH (ref 11.5–15.5)
WBC: 27.4 10*3/uL — ABNORMAL HIGH (ref 4.0–10.5)
nRBC: 0 % (ref 0.0–0.2)

## 2021-06-20 LAB — URINE CULTURE: Culture: <10000 — AB

## 2021-06-20 LAB — BASIC METABOLIC PANEL
Anion gap: 9 (ref 5–15)
BUN: 39 mg/dL — ABNORMAL HIGH (ref 8–23)
CO2: 24 mmol/L (ref 22–32)
Calcium: 7.7 mg/dL — ABNORMAL LOW (ref 8.9–10.3)
Chloride: 102 mmol/L (ref 98–111)
Creatinine, Ser: 1.23 mg/dL — ABNORMAL HIGH (ref 0.44–1.00)
GFR, Estimated: 45 mL/min — ABNORMAL LOW (ref >60)
Glucose, Bld: 145 mg/dL — ABNORMAL HIGH (ref 70–99)
Potassium: 4.1 mmol/L (ref 3.5–5.1)
Sodium: 135 mmol/L (ref 135–145)

## 2021-06-20 LAB — CORTISOL: Cortisol, Plasma: 7.5 ug/dL

## 2021-06-20 LAB — GLUCOSE, CAPILLARY
Glucose-Capillary: 117 mg/dL — ABNORMAL HIGH (ref 70–99)
Glucose-Capillary: 142 mg/dL — ABNORMAL HIGH (ref 70–99)

## 2021-06-20 LAB — MAGNESIUM: Magnesium: 2.5 mg/dL — ABNORMAL HIGH (ref 1.7–2.4)

## 2021-06-20 LAB — C-REACTIVE PROTEIN: CRP: 31.8 mg/dL — ABNORMAL HIGH (ref <1.0)

## 2021-06-20 LAB — SEDIMENTATION RATE: Sed Rate: 64 mm/hr — ABNORMAL HIGH (ref 0–22)

## 2021-06-20 IMAGING — DX DG CHEST 1V
1 series · 1 of 1 positions shown · non-contrast
Comparison: Chest radiograph and CT chest [DATE]

CLINICAL DATA: Dyspnea, back pain

EXAM:
CHEST  1 VIEW

[chest ap]
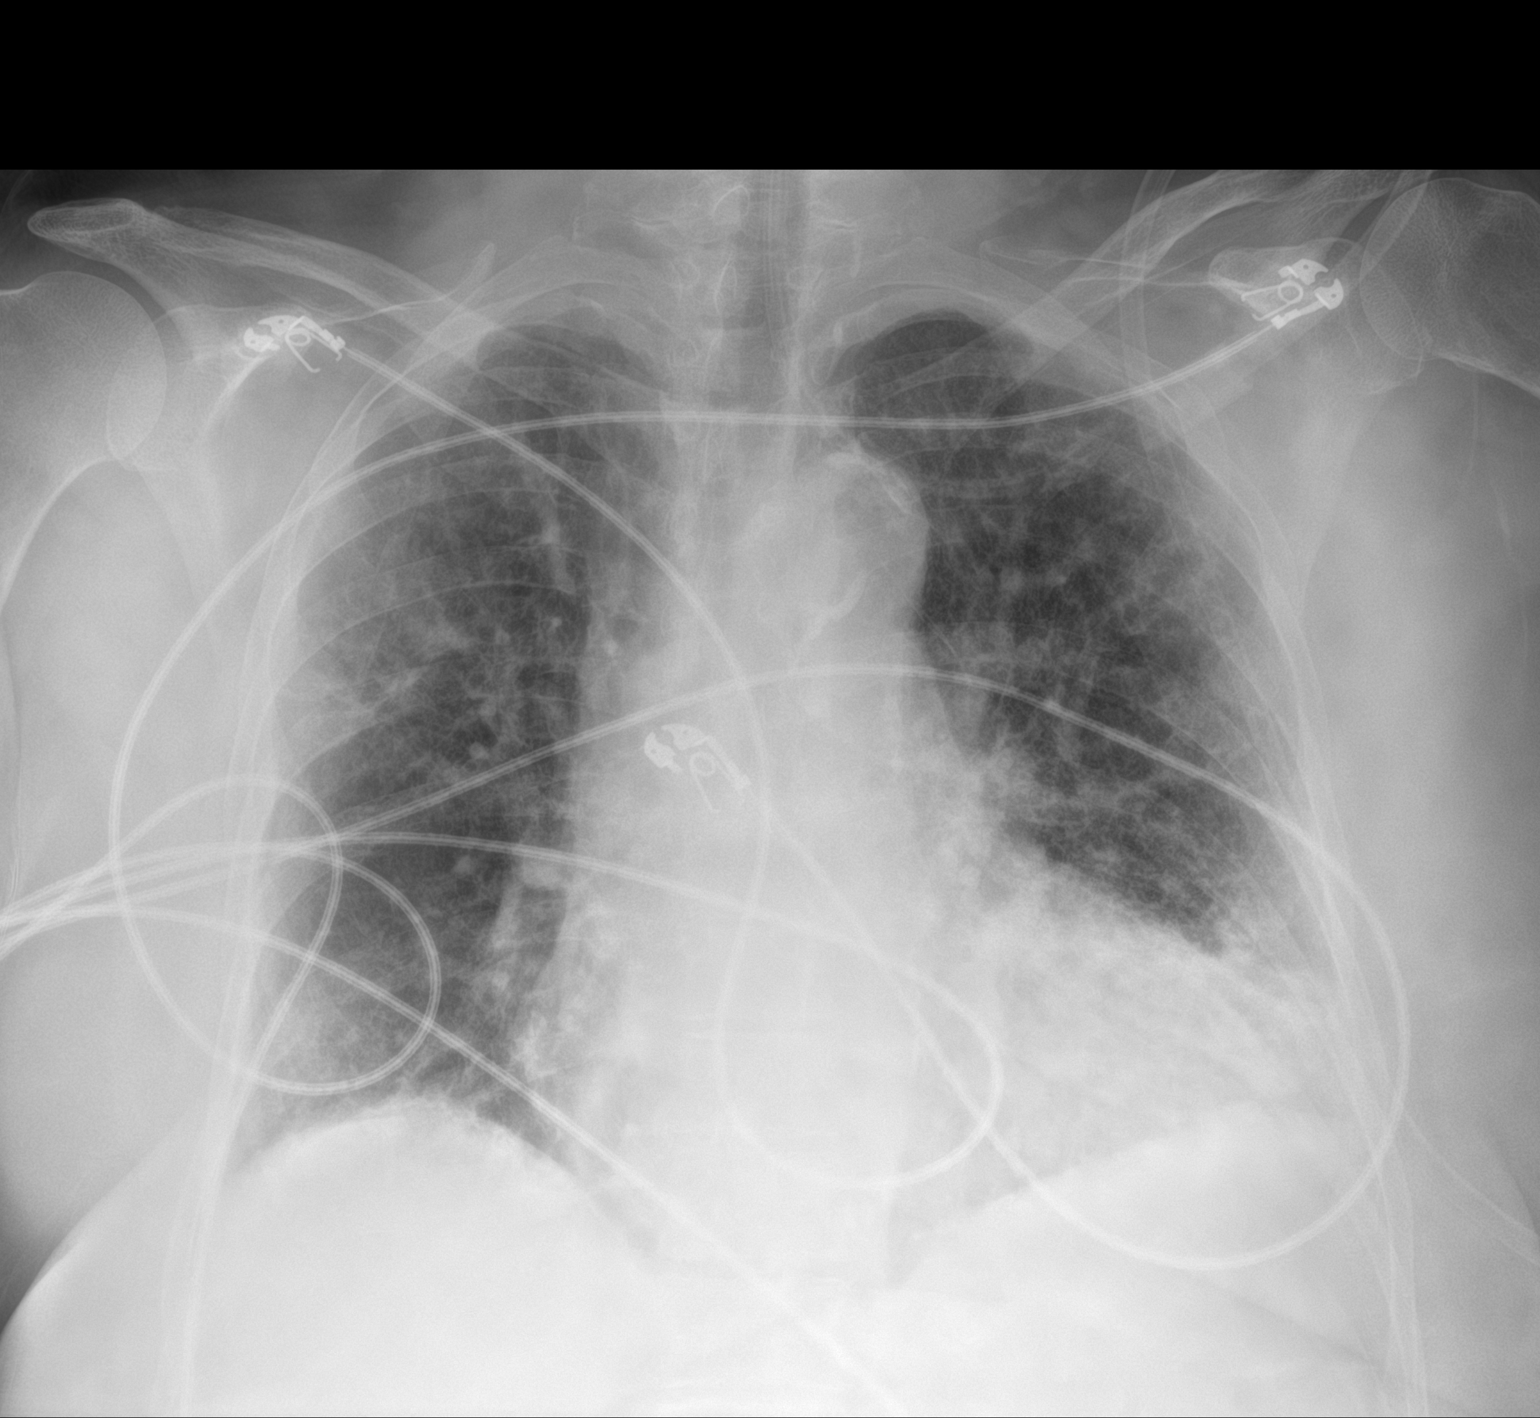

[1 of 1 positions shown; findings below may reference images not displayed]

FINDINGS: The heart remains enlarged, unchanged. The mediastinal contours are
within normal limits. There is calcified atherosclerotic plaque of
the aortic arch.

Increased interstitial markings are seen throughout both lungs
suggesting mild pulmonary interstitial edema superimposed on a
background of emphysematous change as seen on the chest CT obtained
1 day prior. There is no significant pleural effusion. There is no
pneumothorax.

The bones are stable.
IMPRESSION: Unchanged cardiomegaly with suspected mild pulmonary interstitial
edema on a background of emphysema. Aeration is overall not
significantly changed compared to the chest x-ray obtained 1 day
prior.

## 2021-06-20 MED ORDER — FENTANYL CITRATE (PF) 100 MCG/2ML IJ SOLN
12.5000 ug | INTRAMUSCULAR | Status: DC | PRN
  Administered 2021-06-20 – 2021-06-22 (×6): 12.5 ug via INTRAVENOUS
  Filled 2021-06-20 (×6): qty 2

## 2021-06-20 MED ORDER — ONDANSETRON HCL 4 MG/2ML IJ SOLN
INTRAMUSCULAR | Status: AC
  Filled 2021-06-20: qty 2

## 2021-06-20 MED ORDER — AMIODARONE HCL IN DEXTROSE 360-4.14 MG/200ML-% IV SOLN
30.0000 mg/h | INTRAVENOUS | Status: DC
  Administered 2021-06-21: 30 mg/h via INTRAVENOUS
  Filled 2021-06-20: qty 200

## 2021-06-20 MED ORDER — AMIODARONE HCL IN DEXTROSE 360-4.14 MG/200ML-% IV SOLN
60.0000 mg/h | INTRAVENOUS | Status: AC
  Administered 2021-06-20: 60 mg/h via INTRAVENOUS
  Filled 2021-06-20: qty 200

## 2021-06-20 MED ORDER — ONDANSETRON HCL 4 MG/2ML IJ SOLN: 4.0000 mg | Freq: Four times a day (QID) | INTRAMUSCULAR | Status: DC | PRN

## 2021-06-20 MED ORDER — METOPROLOL TARTRATE 5 MG/5ML IV SOLN
5.0000 mg | Freq: Once | INTRAVENOUS | Status: AC
  Administered 2021-06-20: 2.5 mg via INTRAVENOUS

## 2021-06-20 MED ORDER — CHLORHEXIDINE GLUCONATE CLOTH 2 % EX PADS
6.0000 | MEDICATED_PAD | Freq: Every day | CUTANEOUS | Status: DC
  Administered 2021-06-20 – 2021-06-25 (×6): 6 via TOPICAL

## 2021-06-20 MED ORDER — HEPARIN BOLUS VIA INFUSION
4000.0000 [IU] | Freq: Once | INTRAVENOUS | Status: AC
  Administered 2021-06-20: 4000 [IU] via INTRAVENOUS
  Filled 2021-06-20: qty 4000

## 2021-06-20 MED ORDER — FUROSEMIDE 10 MG/ML IJ SOLN
40.0000 mg | Freq: Once | INTRAMUSCULAR | Status: AC
  Administered 2021-06-20: 40 mg via INTRAVENOUS
  Filled 2021-06-20: qty 4

## 2021-06-20 MED ORDER — AMIODARONE HCL IN DEXTROSE 360-4.14 MG/200ML-% IV SOLN
INTRAVENOUS | Status: AC
  Filled 2021-06-20: qty 200

## 2021-06-20 MED ORDER — ONDANSETRON HCL 4 MG/2ML IJ SOLN
4.0000 mg | Freq: Once | INTRAMUSCULAR | Status: AC
  Administered 2021-06-20: 4 mg via INTRAVENOUS

## 2021-06-20 MED ORDER — AMIODARONE LOAD VIA INFUSION
150.0000 mg | Freq: Once | INTRAVENOUS | Status: AC
  Administered 2021-06-20: 150 mg via INTRAVENOUS

## 2021-06-20 MED ORDER — HEPARIN (PORCINE) 25000 UT/250ML-% IV SOLN
1000.0000 [IU]/h | INTRAVENOUS | Status: DC
  Administered 2021-06-20: 1000 [IU]/h via INTRAVENOUS
  Filled 2021-06-20 (×2): qty 250

## 2021-06-20 MED ORDER — NOREPINEPHRINE 4 MG/250ML-% IV SOLN
2.0000 ug/min | INTRAVENOUS | Status: DC
Start: 2021-06-20 — End: 2021-06-23
  Administered 2021-06-20: 2 ug/min via INTRAVENOUS
  Administered 2021-06-21: 7 ug/min via INTRAVENOUS
  Filled 2021-06-20 (×5): qty 250

## 2021-06-20 MED ORDER — SODIUM CHLORIDE 0.9 % IV SOLN: 250.0000 mL | INTRAVENOUS | Status: DC

## 2021-06-20 MED ORDER — CYCLOBENZAPRINE HCL 10 MG PO TABS
10.0000 mg | ORAL_TABLET | Freq: Three times a day (TID) | ORAL | Status: DC
  Administered 2021-06-20 – 2021-06-23 (×11): 10 mg via ORAL
  Filled 2021-06-20 (×11): qty 1

## 2021-06-20 MED ORDER — DILTIAZEM HCL-DEXTROSE 125-5 MG/125ML-% IV SOLN (PREMIX)
5.0000 mg/h | INTRAVENOUS | Status: DC
  Administered 2021-06-20: 10 mg/h via INTRAVENOUS

## 2021-06-20 MED ORDER — SODIUM CHLORIDE 0.9 % IV BOLUS
500.0000 mL | Freq: Once | INTRAVENOUS | Status: AC
  Administered 2021-06-20: 500 mL via INTRAVENOUS

## 2021-06-20 MED ORDER — ACETAMINOPHEN 325 MG PO TABS
650.0000 mg | ORAL_TABLET | Freq: Four times a day (QID) | ORAL | Status: DC
  Administered 2021-06-20 – 2021-06-21 (×3): 650 mg via ORAL
  Filled 2021-06-20 (×3): qty 2

## 2021-06-20 NOTE — Progress Notes (Signed)
   06/20/21 1100  Assess: MEWS Score  Temp 97.8 F (36.6 C)  BP (!) 141/74  Pulse Rate (!) 170  ECG Heart Rate (!) 170  Resp (!) 21  Level of Consciousness Alert  SpO2 (!) 89 %  O2 Device HFNC  O2 Flow Rate (L/min) 10 L/min  Assess: MEWS Score  MEWS Temp 0  MEWS Systolic 0  MEWS Pulse 3  MEWS RR 1  MEWS LOC 0  MEWS Score 4  MEWS Score Color Red  Assess: if the MEWS score is Yellow or Red  Were vital signs taken at a resting state? Yes  Focused Assessment Change from prior assessment (see assessment flowsheet)  Early Detection of Sepsis Score *See Row Information* High  MEWS guidelines implemented *See Row Information* Yes  Treat  MEWS Interventions Administered scheduled meds/treatments (IV cardizem ordered)  Pain Scale 0-10  Pain Score 0  Take Vital Signs  Increase Vital Sign Frequency  Red: Q 1hr X 4 then Q 4hr X 4, if remains red, continue Q 4hrs  Escalate  MEWS: Escalate Red: discuss with charge nurse/RN and provider, consider discussing with RRT  Notify: Charge Nurse/RN  Name of Charge Nurse/RN Notified Yoko RN  Date Charge Nurse/RN Notified 06/20/21  Time Charge Nurse/RN Notified 1100  Notify: Provider  Provider Name/Title Dr. Cathlean Sauer  Date Provider Notified 06/20/21  Time Provider Notified 1100  Notification Type Page  Notification Reason Change in status  Provider response See new orders  Date of Provider Response 06/20/21  Time of Provider Response 1120  Notify: Rapid Response  Name of Rapid Response RN Notified [facility] RN  Date Rapid Response Notified 06/20/21  Time Rapid Response Notified 1100  Document  Patient Outcome Not stable and remains on department (initiating IV cardizem for HR control)  Progress note created (see row info) Yes

## 2021-06-20 NOTE — Progress Notes (Signed)
Pharmacy Note: Antimicrobial Stewardship  Bcx Update:  8/9 BCx originally reported as Staphylococcus aureus Micro lab reported on 8/11 that BCx now also growing Streptococcus gallolyticus  ID team notified  Lestine Box, PharmD PGY2 Infectious Diseases Pharmacy Resident   Please check [email or web address] for unit-specific pharmacy phone numbers

## 2021-06-20 NOTE — Consult Note (Signed)
Cardiology Consultation:   Patient ID: Samantha King MRN: [id number]; DOB: 1946/02/23  Admit date: 06/18/2021 Date of Consult: 06/20/2021  PCP:  Haywood Pao, MD   [facility] Providers Cardiologist:  Lauree Chandler, MD   {  Patient Profile:   Samantha King is a 75 y.o. female with a hx of CAD, hypertension, dyslipidemia who is being seen 06/20/2021 for the evaluation of rapid atrial fib  at the request of Dr. Cathlean Sauer. [name]  History of Present Illness:   Ms. Samantha King is a 75 year old female with history of coronary artery disease and inferolateral ST segment elevation myocardial infarction.  She is status post DES to her right coronary artery by Dr. Angelena Form several years ago.  She really recently presented to the hospital several days ago with severe low back pain.  She had elevated white count.  Today she developed marked hypotension.  She is wheezing on exam.  She has developed rapid atrial fibrillation with numerous premature ventricular contractions.  She is afebrile.  Her oxygen saturation is low. O2 sat was much better yesterday   She does not report pleuritic chest pain to me.  She is not had any leg swelling.    Past Medical History:  Diagnosis Date   CAD (coronary artery disease)    a. s/p INF-LAT STEMI => s/p DES-RCA c/b VF arrest and cardiogenic shock   Cardiac arrest ([place]) 10/2012   STEMI with VF arrest x 2    GERD (gastroesophageal reflux disease)    H1N1 influenza 10/2012   Hx of echocardiogram    a. Echo 10/30/12: Moderate LVH, EF 55-60%, normal wall motion, PASP 45   Hyperlipidemia    Hypertension    Myocardial infarction ([place])    Pneumonia 10/2012   a. STEMI c/b LLL pneumonia in setting of recent H1N1 influenza   PUD (peptic ulcer disease)    Syncope 1996   reported episode while driving in [place] in 1996 with full cardiac workup = negative   Tobacco abuse     Past Surgical History:  Procedure Laterality Date   BREAST  EXCISIONAL BIOPSY     left breast ? 1981   CORONARY ANGIOPLASTY WITH STENT PLACEMENT     s/p inferior STEMI c/b VF arrest and cardiogenic shock requiring IABP   ESOPHAGOGASTRODUODENOSCOPY (EGD) WITH PROPOFOL N/A 03/10/2020   Procedure: ESOPHAGOGASTRODUODENOSCOPY (EGD) WITH PROPOFOL;  Surgeon: Lavena Bullion, DO;  Location: [place];  Service: Gastroenterology;  Laterality: N/A;   HEMOSTASIS CLIP PLACEMENT  03/10/2020   Procedure: HEMOSTASIS CLIP PLACEMENT;  Surgeon: Lavena Bullion, DO;  Location: [facility];  Service: Gastroenterology;;   HEMOSTASIS CONTROL  03/10/2020   Procedure: HEMOSTASIS CONTROL;  Surgeon: Lavena Bullion, DO;  Location: [place];  Service: Gastroenterology;;  epi   INTRA-AORTIC BALLOON PUMP INSERTION  10/29/2012   Procedure: INTRA-AORTIC BALLOON PUMP INSERTION;  Surgeon: Burnell Blanks, MD;  Location[facility] LAB;  Service: Cardiovascular;;   LEFT HEART CATHETERIZATION WITH CORONARY ANGIOGRAM N/A 10/29/2012   Procedure: LEFT HEART CATHETERIZATION WITH CORONARY ANGIOGRAM;  Surgeon: Burnell Blanks, MD;  Location: [facility] CATH LAB;  Service: Cardiovascular;  Laterality: N/A;   PERCUTANEOUS CORONARY STENT INTERVENTION (PCI-S)  10/29/2012   Procedure: PERCUTANEOUS CORONARY STENT INTERVENTION (PCI-S);  Surgeon: Burnell Blanks, MD;  Location: [facility] CATH LAB;  Service: Cardiovascular;;   TUBAL LIGATION       Home Medications:  Prior to Admission medications   Medication Sig Start Date End Date Taking? Authorizing Provider  acetaminophen (TYLENOL) 325 MG tablet Take 650 mg by mouth every 6 (six) hours as needed for mild pain or headache. For headache or pain   Yes [provider]  atorvastatin (LIPITOR) 80 MG tablet Take 1 tablet (80 mg total) by mouth daily at 6 PM. 11/15/12  Yes Love, Ivan Anchors, PA-C  clopidogrel (PLAVIX) 75 MG tablet Take 1 tablet (75 mg total) by mouth daily. 03/17/20  Yes Ghimire, Henreitta Leber, MD  escitalopram (LEXAPRO) 5  MG tablet Take 5 mg by mouth daily.   Yes [provider]  furosemide (LASIX) 40 MG tablet Take 40 mg by mouth daily. 07/23/20  Yes [provider]  Javier Docker Oil 500 MG CAPS Take 500 mg by mouth daily.   Yes [provider]  lisinopril (ZESTRIL) 5 MG tablet Take 5 mg by mouth daily. 07/19/20  Yes [provider]  metoprolol tartrate (LOPRESSOR) 25 MG tablet Take 1 tablet (25 mg total) by mouth 2 (two) times daily. 11/15/12  Yes Love, Ivan Anchors, PA-C  nitroGLYCERIN (NITROSTAT) 0.4 MG SL tablet DISSOLVE ONE TABLET UNDER THE TONGUE EVERY FIVE MINUTES AS NEEDED FOR CHEST PAIN. DO NOT EXCEED A TOTAL OF THREE DOSES IN 15 MINUTES Patient taking differently: Place 0.4 mg under the tongue every 5 (five) minutes as needed for chest pain. 09/03/20  Yes Burnell Blanks, MD  pantoprazole (PROTONIX) 40 MG tablet Take 1 tablet (40 mg total) by mouth daily. 12/10/20 06/18/21 Yes Burnell Blanks, MD  PROAIR RESPICLICK [id number] [phone] Base) MCG/ACT AEPB Take 2 puffs by mouth every 6 (six) hours as needed for wheezing or shortness of breath. 12/27/19  Yes [provider]  Probiotic Product (PROBIOTIC PO) Take 1 capsule by mouth daily.   Yes [provider]  psyllium (REGULOID) 0.52 G capsule Take 0.52 g by mouth daily.   Yes [provider]  SYMBICORT 160-4.5 MCG/ACT inhaler Inhale 2 puffs into the lungs daily. 01/13/20  Yes [provider]    Inpatient Medications: Scheduled Meds:  acetaminophen  650 mg Oral QID   atorvastatin  80 mg Oral q1800   budesonide (PULMICORT) nebulizer solution  0.25 mg Nebulization BID   cyclobenzaprine  10 mg Oral TID   escitalopram  5 mg Oral Daily   metoprolol tartrate  25 mg Oral BID   pantoprazole  40 mg Oral Daily   Continuous Infusions:  amiodarone 60 mg/hr (06/20/21 1406)   Followed by   amiodarone      ceFAZolin (ANCEF) IV 2 g (06/20/21 0752)   heparin 1,000 Units/hr (06/20/21 1403)   PRN  Meds: albuterol, ALPRAZolam, fentaNYL (SUBLIMAZE) injection, Muscle Rub, oxyCODONE  Allergies:    Allergies  Allergen Reactions   Shellfish Allergy Nausea And Vomiting   Codeine Other (See Comments)    Makes me hyperactive and not able to sleep   Lidocaine Palpitations    Social History:   Social History   Socioeconomic History   Marital status: Married    Spouse name: Not on file   Number of children: Not on file   Years of education: Not on file   Highest education level: Not on file  Occupational History   Not on file  Tobacco Use   Smoking status: Former    Packs/day: 1.00    Years: 20.00    Pack years: 20.00    Types: Cigarettes    Quit date: 11/05/2012    Years since quitting: 8.6   Smokeless tobacco: Never  Vaping Use  Vaping Use: Never used  Substance and Sexual Activity   Alcohol use: No   Drug use: No   Sexual activity: Not on file  Other Topics Concern   Not on file  Social History Narrative   Not on file   Social Determinants of Health   Financial Resource Strain: Not on file  Food Insecurity: Not on file  Transportation Needs: Not on file  Physical Activity: Not on file  Stress: Not on file  Social Connections: Not on file  Intimate Partner Violence: Not on file    Family History:    Family History  Problem Relation Age of Onset   Heart attack Brother    Stroke Maternal Grandmother    Colon cancer Neg Hx    Pancreatic cancer Neg Hx    Prostate cancer Neg Hx    Rectal cancer Neg Hx    Stomach cancer Neg Hx      ROS:  Please see the history of present illness.   All other ROS reviewed and negative.     Physical Exam/Data:   Vitals:   06/20/21 1400 06/20/21 1405 06/20/21 1410 06/20/21 1415  BP: 108/66 (!) 95/57 97/69 (!) 88/58  Pulse:      Resp: 18 19 (!) 21 20  Temp:      TempSrc:      SpO2:      Weight:      Height:        Intake/Output Summary (Last 24 hours) at 06/20/2021 1422 Last data filed at 06/20/2021 1008 Gross  per 24 hour  Intake 360 ml  Output --  Net 360 ml   Last 3 Weights 06/18/2021 01/04/2021 08/15/2020  Weight (lbs) 186 lb 15.2 oz 173 lb 175 lb  Weight (kg) 84.8 kg 78.472 kg 79.379 kg     Body mass index is 33.12 kg/m.  General: Moderately obese female, in mild to moderate distress.  She is lying flat in bed.  Appears to be in pain. HEENT: normal Lymph: no adenopathy Neck: no JVD Endocrine:  No thryomegaly Vascular: No carotid bruits; FA pulses 2+ bilaterally without bruits  Cardiac:   irregularly irregular.  Tachycardic.  Frequent premature ventricular beats. Lungs: Bilateral wheezing. Abd: She is extremely tender along both flanks. Ext: No edema, no palpable cords. Musculoskeletal:  No deformities, BUE and BLE strength normal and equal Skin: warm and dry  Neuro:  CNs 2-12 intact, no focal abnormalities noted Psych:  Normal affect   EKG:  The EKG was personally reviewed and demonstrates: Atrial fibrillation with rapid ventricular response of 176.  Telemetry:  Telemetry was personally reviewed and demonstrates:  atrial fib, frequent PVCs  Relevant CV Studies:   Laboratory Data:  High Sensitivity Troponin:   Recent Labs  Lab 06/18/21 1412 06/18/21 1530  TROPONINIHS 47* 49*     Chemistry Recent Labs  Lab 06/18/21 1412 06/19/21 0322 06/20/21 0553  NA 137 135 135  K 3.6 3.7 4.1  CL 106 104 102  CO2 17* 20* 24  GLUCOSE 117* 139* 145*  BUN 23 19 39*  CREATININE 1.15* 1.03* 1.23*  CALCIUM 8.5* 7.6* 7.7*  GFRNONAA 49* 56* 45*  ANIONGAP '14 11 9    '$ No results for input(s): PROT, ALBUMIN, AST, ALT, ALKPHOS, BILITOT in the last 168 hours. Hematology Recent Labs  Lab 06/18/21 1412 06/19/21 0322 06/20/21 0553  WBC 24.9* 24.9* 27.4*  RBC 4.83 4.25 4.14  HGB 13.6 12.1 12.0  HCT 43.7 38.6 37.3  MCV 90.5 90.8  90.1  MCH 28.2 28.5 29.0  MCHC 31.1 31.3 32.2  RDW 17.6* 17.9* 17.9*  PLT 231 238 288   BNPNo results for input(s): BNP, PROBNP in the last 168 hours.   DDimer No results for input(s): DDIMER in the last 168 hours.   Radiology/Studies:  DG Chest 1 View  Result Date: 06/20/2021 CLINICAL DATA:  Dyspnea, back pain EXAM: CHEST  1 VIEW COMPARISON:  Chest radiograph and CT chest 06/18/2021 FINDINGS: The heart remains enlarged, unchanged. The mediastinal contours are within normal limits. There is calcified atherosclerotic plaque of the aortic arch. Increased interstitial markings are seen throughout both lungs suggesting mild pulmonary interstitial edema superimposed on a background of emphysematous change as seen on the chest CT obtained 1 day prior. There is no significant pleural effusion. There is no pneumothorax. The bones are stable. IMPRESSION: Unchanged cardiomegaly with suspected mild pulmonary interstitial edema on a background of emphysema. Aeration is overall not significantly changed compared to the chest x-ray obtained 1 day prior. Electronically Signed   By: Valetta Mole MD   On: 06/20/2021 13:09   DG Chest 2 View  Result Date: 06/18/2021 CLINICAL DATA:  Back pain.  Hypoxia. EXAM: CHEST - 2 VIEW COMPARISON:  Mar 10, 2020. FINDINGS: Enlarged cardiac silhouette. Calcific atherosclerosis aorta. Chronically prominent lung markings with mildly increased streaky opacities in the right upper lung and bilateral lung bases. No visible pleural effusions or pneumothorax. Osteopenia. Visualized thoracic vertebral body heights are maintained. IMPRESSION: 1. Chronically prominent lung markings with mildly increased streaky opacities in the right upper lung and bilateral lung bases, which could represent atelectasis, aspiration, and or pneumonia. 2. Cardiomegaly. Electronically Signed   By: Margaretha Sheffield MD   On: 06/18/2021 14:12   CT L-SPINE NO CHARGE  Result Date: 06/18/2021 CLINICAL DATA:  Low back pain M54.50 (ICD-10-CM) EXAM: CT LUMBAR SPINE WITHOUT CONTRAST TECHNIQUE: Multidetector CT imaging of the lumbar spine was performed without intravenous  contrast administration. Multiplanar CT image reconstructions were also generated. COMPARISON:  None. FINDINGS: Segmentation: Transitional lumbosacral anatomy. For the purposes of this dictation there is a partially sacralized L5 vertebral body. Small/hypoplastic ribs at T12. Alignment: Dextrocurvature centered at L2-L3. grade 1 anterolisthesis of L4 on L5 with right-sided L4 pars defect. Vertebrae: Bony fusion across the L5-S1 disc space. No evidence of acute fracture. Vertebral body heights are maintained. Osteopenia. Paraspinal and other soft tissues: Please see same day CTA chest abdomen/pelvis for intra-abdominal and intrathoracic evaluation. Disc levels: Severe multilevel degenerative disease at T12-L1, L1-L2, L2-L3, L3-L4 and L4-L5 with disc height loss, endplate sclerosis, and vacuum disc phenomenon. At L4-L5 there is potentially severe canal stenosis at least moderate right foraminal stenosis. Multilevel endplate spurring and severe facet hypertrophy with at least moderate left foraminal stenosis at L2-L3 and L3-L4. IMPRESSION: 1. Transitional lumbosacral anatomy, as detailed above. 2. Grade 1 anterolisthesis of L4 on L5 with right L4 pars defect and severe degenerative change with potentially severe canal stenosis and at least moderate right foraminal stenosis at this level. 3. Dextrocurvature centered at L2-L3 with severe multilevel degenerative change and at least moderate left foraminal stenosis at L2-L3 and L3-L4. 4. Given the above findings, recommend an MRI of the lumbar spine to better evaluate the canal and foramina. 5. No evidence of acute fracture or clearly traumatic malalignment. Electronically Signed   By: Margaretha Sheffield MD   On: 06/18/2021 19:19   CT Angio Chest/Abd/Pel for Dissection W and/or Wo Contrast  Result Date: 06/18/2021 CLINICAL DATA:  Chest  pain.  Aortic dissection suspected. EXAM: CT ANGIOGRAPHY CHEST, ABDOMEN AND PELVIS TECHNIQUE: Non-contrast CT of the chest was initially  obtained. Multidetector CT imaging through the chest, abdomen and pelvis was performed using the standard protocol during bolus administration of intravenous contrast. Multiplanar reconstructed images and MIPs were obtained and reviewed to evaluate the vascular anatomy. CONTRAST:  150m OMNIPAQUE IOHEXOL 350 MG/ML SOLN COMPARISON:  None. FINDINGS: CTA CHEST FINDINGS Cardiovascular: Pre contrast imaging shows no hyperdense crescent in the wall of the thoracic aorta to suggest acute intramural hematoma. Postcontrast imaging shows no dissection of the thoracic aorta. No thoracic aortic aneurysm. Ascending thoracic aorta measures 3.7 cm diameter. Moderate atherosclerotic calcification is noted in the wall of the thoracic aorta. No large central pulmonary embolus in the pulmonary outflow tract or main pulmonary arteries. No filling defect in the lobar pulmonary arteries to either lung to suggest pulmonary embolus. Cardiopericardial silhouette is at upper limits of normal for size. Coronary artery calcification is evident. Mediastinum/Nodes: No mediastinal lymphadenopathy. There is no hilar lymphadenopathy. The esophagus has normal imaging features. There is no axillary lymphadenopathy. Lungs/Pleura: Centrilobular and paraseptal emphysema evident. Probable scarring anterior right upper lobe. 12 mm nodular opacity seen along the inferior margin of the apparent scarring (39/6). 2 cm irregular opacity noted posterior right costophrenic sulcus on 73/6. Atelectasis noted left lower lobe. 5 mm left lower lobe nodule evident on 59/6. 7 mm right middle lobe nodule visible on 67/6. No pleural effusion. Musculoskeletal: No worrisome lytic or sclerotic osseous abnormality. Review of the MIP images confirms the above findings. CTA ABDOMEN AND PELVIS FINDINGS VASCULAR Aorta: Normal caliber aorta without aneurysm, dissection, vasculitis or significant stenosis. Celiac: Patent without evidence of aneurysm, dissection, vasculitis or  significant stenosis. SMA: Dense calcific plaque at the ostium with moderate luminal narrowing. No substantial post stenotic dilatation. No evidence for vasculitis or dissection. Renals: Both renal arteries are patent without evidence of aneurysm, dissection, vasculitis, fibromuscular dysplasia or significant stenosis. IMA: Patent without evidence of aneurysm, dissection, vasculitis or significant stenosis. Inflow: Patent without evidence of aneurysm, dissection, vasculitis or significant stenosis. Veins: No obvious venous abnormality within the limitations of this arterial phase study. Review of the MIP images confirms the above findings. NON-VASCULAR Hepatobiliary: No suspicious focal abnormality within the liver parenchyma. Gallbladder is distended. No intrahepatic or extrahepatic biliary dilation. Pancreas: No focal mass lesion. No dilatation of the main duct. No intraparenchymal cyst. No peripancreatic edema. Spleen: No splenomegaly. No focal mass lesion. Adrenals/Urinary Tract: Thickening of the right adrenal gland noted. Multiple left adrenal nodules measure up to 16 mm in size, although attenuation too high to allow definitive characterization as adenomas. No suspicious enhancing renal mass lesion. 2 mm nonobstructing stone noted upper pole left kidney. No evidence for hydroureter. The urinary bladder appears normal for the degree of distention. Stomach/Bowel: Stomach is nondistended. Duodenum is normally positioned as is the ligament of Treitz. No small bowel wall thickening. No small bowel dilatation. The cecum is mobile, positioned in the anterior midline abdomen. The terminal ileum is normal. The appendix is normal. Diverticular changes are noted in the left colon without evidence of diverticulitis. Lymphatic: There is no gastrohepatic or hepatoduodenal ligament lymphadenopathy. No retroperitoneal or mesenteric lymphadenopathy. No pelvic sidewall lymphadenopathy. Reproductive: The uterus is unremarkable.   There is no adnexal mass. Other: No intraperitoneal free fluid. Musculoskeletal: No worrisome lytic or sclerotic osseous abnormality. Degenerative disc disease noted lumbar spine. Review of the MIP images confirms the above findings. IMPRESSION: 1. No evidence for thoracoabdominal  aortic aneurysm or dissection. No acute intramural hematoma of the thoracic aorta. 2. No large central pulmonary embolus. 3. Multiple bilateral pulmonary nodules measuring up to 2 cm. These larger nodules may reflect infectious/inflammatory etiology. Follow-up CT chest in 3 months after therapy recommended. 4. Moderate narrowing at the ostium of the SMA. 5. Multiple left adrenal nodules measuring up to 16 mm in size, although attenuation is too high to allow definitive characterization as adenomas. This could be reassessed at the time of follow-up chest CT or abdominal MRI could be used for subsequent more definitive characterization as warranted. 6. 2 mm nonobstructing left renal stone. 7. Left colonic diverticulosis without diverticulitis. 8. Aortic Atherosclerosis (ICD10-I70.0) and Emphysema (ICD10-J43.9). Electronically Signed   By: Misty Stanley M.D.   On: 06/18/2021 18:00     Assessment and Plan:   Hypotension: Patient had sudden hypotension.  This was associated with hypoxemia and rapid atrial fibrillation.  I think we need to rule out pulmonary embolus.  This also could be septic shock.  Her blood pressure has come up slightly with some fluids. Should be transferred to the ICU for further stabilization.  2.  Atrial fibrillation: Her A. fib rate is around 136-140.  The atrial fibrillation is not the cause of her hypertension.  It would have to be much faster to cause that degree of hemodynamic instability.  She was noted to have normal LV function by echocardiogram in March of this year.  2.  Hypoxemia: She is thought to have pneumonia.  We will also rule out pulmonary embolus as noted above.  4.  Back pain: She has  severe degenerative changes in her lumbar spine.  I will leave further evaluation and management to the primary medical team.     Risk Assessment/Risk Scores:        CHA2DS2-VASc Score = 4  This indicates a 4.8% annual risk of stroke. The patient's score is based upon: CHF History: No HTN History: Yes Diabetes History: No Stroke History: No Vascular Disease History: No Age Score: 2 Gender Score: 1        For questions or updates, please contact [place] Please consult [email or web address] for contact info under    Signed, Mertie Moores, MD  06/20/2021 2:22 PM

## 2021-06-20 NOTE — TOC Benefit Eligibility Note (Signed)
Patient Teacher, English as a foreign language completed.    The patient is currently admitted and upon discharge could be taking Eliquis 5 mg.  The current 30 day co-pay is, $30.00.   The patient is insured through [place], [place] Patient Advocate Specialist [place] Team Direct Number: [phone]  Fax: [phone]

## 2021-06-20 NOTE — Progress Notes (Signed)
PT Cancellation Note  Patient Details Name: EDWINNA NOE MRN: [id number] DOB: August 15, 1946   Cancelled Treatment:    Reason Eval/Treat Not Completed: Medical issues which prohibited therapy. Patient is now on 10L HFNC, continues to have severe pain, per RN. She is unable to tolerate PT at this time. Will continue to monitor and evaluate when medically appropriate.    Saray Capasso 06/20/2021, 9:11 AM

## 2021-06-20 NOTE — Progress Notes (Addendum)
PROGRESS NOTE    Samantha King  [id number] DOB: 26-Jun-1946 DOA: 06/18/2021 PCP: Haywood Pao, MD    Brief Narrative:  Mrs. Samantha King was admitted to the hospital with a working diagnosis of low back pain in the setting of sepsis/pulmonary nodules.   75 year old female past medical history for coronary artery disease, hypertension, dyslipidemia, gastritis and GERD who presented with lower back pain.  Reported 2 days back pain, severe in intensity, no fevers or chills.  Because of persistent pain she came to the hospital, she was found hypoxic, oxygen saturation 80% on room air.  Her blood pressure 149/91, heart rate 109, respirate 20, temperature 97.7, she was tachycardic, lungs with diffuse bilateral wheezing, abdomen soft nontender, no lower extremity edema.   Lumbar CT showing arterial listhesis L4-L5.  Severe degenerative joint changes.  Severe canal stenosis.   She was placed on antibiotic therapy for pulmonary nodules, possible infection. Placed on bronchodilator therapy for COPD exacerbation.   1 bottle blood culture positive for Staph gallolyticus  Continue to have severe back pain, requiring IV analgesics, unable to get spine MRI due to severe back pain and can not stay supine    Assessment & Plan:   Principal Problem:   MSSA bacteremia Active Problems:   Back pain   Sepsis (Samantha King)   COPD with acute exacerbation (HCC)   Acute hypoxemic respiratory failure (HCC)   Pulmonary nodules   Severe back pain, acute on chronic. (Positive staph bacteremia) sepsis.  Continue to have severe back pain, she has been afebrile, and final report on blood culture is staph gallolyticus (just one bottle).   Plan to change hydromorphone to fentanyl IV for better pain control. Will increase dose of cyclobenzaprine to 10 mg tid and continue with scheduled acetaminophen.    2. Pulmonary nodules.  CT chest personally reviewed more prominent nodule right upper lobe. No cavitation,  not clear if septic in origin.  Follow up on echocardiogram and further cultures Continue with cefazolin for now.    3. Acute hypoxemic respiratory failure due to COPD exacerbation. This am with worsening dyspnea and worsening oxygenation. Follow up chest film with signs of pulmonary edema due to volume overload.  Plan to dc IV fluids and add one dose of furosemide IV to target negative fluid balance,     4. HTN/ dyslipidemia/ CAD. Continue with atorvastatin. Resume diuresis with IV furosemide   Continue holding on clopidogrel for now, until results from spine MRI    5. Depression Continue with lexapro   6. NEW AKI. Renal function with serum cr at 1,23 with K at 4,1 and serum bicarbonate at 24.  Patient continue to be at high risk for worsening respiratory failure   Status is: Inpatient  Remains inpatient appropriate because:IV treatments appropriate due to intensity of illness or inability to take PO  Dispo: The patient is from: Home              Anticipated d/c is to: Home              Patient currently is not medically stable to d/c.   Difficult to place patient No   DVT prophylaxis: Enoxaparin   Code Status:   full  Family Communication:  I spoke with patient's husband  at the bedside, we talked in detail about patient's condition, plan of care and prognosis and all questions were addressed.    Antimicrobials:  Cefazolin     Subjective: Patient with worsening dyspnea this am , worse  back pain, no nausea or vomiting, no chest pain   Objective: Vitals:   06/20/21 0853 06/20/21 0914 06/20/21 0959 06/20/21 1030  BP: (!) 168/53 (!) 181/99 (!) 180/81 (!) 141/48  Pulse:   (!) 110   Resp:  (!) '21 19 17  '$ Temp:      TempSrc:      SpO2:   (!) 89%   Weight:      Height:       No intake or output data in the 24 hours ending 06/20/21 1100 Filed Weights   06/18/21 2349  Weight: 84.8 kg    Examination:   General: in pain and deconditioned  Neurology: Awake and alert,  non focal  E ENT: no pallor, no icterus, oral mucosa moist Cardiovascular: No JVD. S1-S2 present, rhythmic, no gallops, rubs, or murmurs. No lower extremity edema. Pulmonary: positive breath sounds bilaterally, decreased air movement, no wheezing, positive bilateral rhonchi and rales. Gastrointestinal. Abdomen distended but not tender Skin. No rashes Musculoskeletal: no joint deformities     Data Reviewed: I have personally reviewed following labs and imaging studies  CBC: Recent Labs  Lab 06/18/21 1412 06/19/21 0322 06/20/21 0553  WBC 24.9* 24.9* 27.4*  NEUTROABS 21.0*  --  24.0*  HGB 13.6 12.1 12.0  HCT 43.7 38.6 37.3  MCV 90.5 90.8 90.1  PLT 231 238 [id number]   Basic Metabolic Panel: Recent Labs  Lab 06/18/21 1412 06/18/21 1805 06/19/21 0322 06/20/21 0553  NA 137  --  135 135  K 3.6  --  3.7 4.1  CL 106  --  104 102  CO2 17*  --  20* 24  GLUCOSE 117*  --  139* 145*  BUN 23  --  19 39*  CREATININE 1.15*  --  1.03* 1.23*  CALCIUM 8.5*  --  7.6* 7.7*  MG  --  2.0  --   --    GFR: Estimated Creatinine Clearance: 39.5 mL/min (A) (by C-G formula based on SCr of 1.23 mg/dL (H)). Liver Function Tests: No results for input(s): AST, ALT, ALKPHOS, BILITOT, PROT, ALBUMIN in the last 168 hours. No results for input(s): LIPASE, AMYLASE in the last 168 hours. No results for input(s): AMMONIA in the last 168 hours. Coagulation Profile: No results for input(s): INR, PROTIME in the last 168 hours. Cardiac Enzymes: No results for input(s): CKTOTAL, CKMB, CKMBINDEX, TROPONINI in the last 168 hours. BNP (last 3 results) No results for input(s): PROBNP in the last 8760 hours. HbA1C: No results for input(s): HGBA1C in the last 72 hours. CBG: No results for input(s): GLUCAP in the last 168 hours. Lipid Profile: No results for input(s): CHOL, HDL, LDLCALC, TRIG, CHOLHDL, LDLDIRECT in the last 72 hours. Thyroid Function Tests: Recent Labs    06/18/21 1805  TSH 0.793   Anemia  Panel: No results for input(s): VITAMINB12, FOLATE, FERRITIN, TIBC, IRON, RETICCTPCT in the last 72 hours.    Radiology Studies: I have reviewed all of the imaging during this hospital visit personally     Scheduled Meds:  atorvastatin  80 mg Oral q1800   budesonide (PULMICORT) nebulizer solution  0.25 mg Nebulization BID   cyclobenzaprine  7.5 mg Oral TID   escitalopram  5 mg Oral Daily   metoprolol tartrate  25 mg Oral BID   pantoprazole  40 mg Oral Daily   Continuous Infusions:   ceFAZolin (ANCEF) IV 2 g (06/20/21 0752)     LOS: 2 days        Riyana Biel  Gerome Apley, MD

## 2021-06-20 NOTE — Progress Notes (Signed)
Patient has refused to be repositioned several times through out the night.  Patient states that she will try to move herself.  Will continue to follow.   Donah Driver, RN

## 2021-06-20 NOTE — Significant Event (Signed)
Rapid Response Event Note   Reason for Call :  Afib RVR, HR 170s bpm  Initial Focused Assessment:  Pt lying in bed, alert. Intermittent confusion.  Skin is cool, pale. Diaphoretic, clammy.  Abdomen is round, distended. Accessory muscle use.  Afib RVR on monitor, HR 140-170 bpm. HR intermittently converts to a bigeminy with bundle branch block rhythm, HR 130 bpm.  Expiratory wheeze auscultated, pt is noted to be tachypneic with pursed lip breathing.  Pt endorses pain to her back.  Pt endorses intermittent nausea.   -CBG: 117 -VS: T 98.13F, BP 100/77, HR 147, RR 26, SpO2 88% on 10L HFNC  Interventions:  -Cardizem gtt initiated and titrated to 15 mg/hr (max) -2.'5mg'$  IV Lopressor -500 cc bolus for hypotension following 2.'5mg'$  IV lopressor and Cardizem gtt decreased from '15mg'$ /hr to '10mg'$ /hr -HFNC increased from 10L HFNC to 15L NRB, SpO2 88-91  -Transitioned pt to Amio gtt with bolus and heparin gtt - Cardizem stopped -Levophed initiated  Plan of Care:  -Transfer to ICU  Event Summary:  MD Notified: Dr. Cathlean Sauer Call Time: 1112 Arrival Time: [id number] End Time: [place], RN

## 2021-06-20 NOTE — Progress Notes (Signed)
Patient's HR increased into the 170s. EKG obtained and showed afib RVR. Patient also having frequent runs of PVCs. BP initially 85/63; patient is asymptomatic. Dr. Cathlean Sauer was notified and ordered IV cardizem and magnesium level. Rapid response RN at bedside.

## 2021-06-20 NOTE — Consult Note (Addendum)
NAME:  Samantha King, MRN:  [id number], DOB:  04-08-1946, LOS: 2 ADMISSION DATE:  06/18/2021, CONSULTATION DATE:  06/20/21 REFERRING MD:  TRH, CHIEF COMPLAINT:  afib rvr and shock   History of Present Illness:  75 yo female who was in normal state of health presented 2 days ago with sudden onset back pain. Per pt's husband they had been to a movie on Friday and she was ok on Saturday and Sunday. Monday she began to have lower back pain and she originally felt she may have adjusted the seat at the theatre to prompt the pain. However pain worsened and she was unable to move 2/2 pain so Tuesday ems was called.   Pt was admitted found to have mssa bacteremia. She was started on appropriate therapy and was awaiting imaging of back. Unfortunately on 8/11 pt decompensated and req transfer to ICU 2/2 afib with rvr, hypotension req pressors and hypoxemia req hhfnc.   Husband at bedside and clear about pt's wishes for full code   Pertinent  Medical History  CAD  H/o vf arrest Hyperlipidemia Htn Hfpef copd  Significant Hospital Events: Including procedures, antibiotic start and stop dates in addition to other pertinent events   8/10: ID consulted 2/2 MSSA bacteremia 8/11: transferred to ICU for afib rvr and septic shock, cardiology consulted as well.   Interim History / Subjective:  8/11: as above  Objective   Blood pressure 101/63, pulse (!) 124, temperature 98.6 F (37 C), temperature source Oral, resp. rate 19, height '5\' 3"'$  (1.6 m), weight 84.8 kg, SpO2 (!) 87 %.        Intake/Output Summary (Last 24 hours) at 06/20/2021 1458 Last data filed at 06/20/2021 1008 Gross per 24 hour  Intake 360 ml  Output --  Net 360 ml   Filed Weights   06/18/21 2349  Weight: 84.8 kg    Examination: General: awake but appears acutely ill. Laying reclined in bed, diaphoretic  HENT: NCAT, EOMI, PERRLA, mmmp Lungs: + wheeze bilaterally.  Cardiovascular: irreg irreg >120rate Abdomen: distended,  BS decreased, mildly ttp Extremities: no c/c/e Neuro: awake, able to converse, follows commands, denies pain in legs but pain in back GU: deferred  Resolved Hospital Problem list   N/A  Assessment & Plan:  Shock, multifactorial  -2/2 sepsis and afib rvr -titrate pressors -rate control as able with amio (was on diltiazem) -treat infection -repeat blood cx -possible need for cvc placement but at this time tolerating peripheral norepi -avoid further fluids as able -check cortisol H/o hypertension:  -holding home meds.  Hyperlipidemia:  -statin H/o MI with VF arrest CAD -on plavix at baseline, holding at this time in case of need for surgical intervention after imaging.  H/o HFpEF:  -echo pending -diurese when able -3/22 LVEF 60% Afib with rvr:  -new onset -on amio -on heparin infusion  Acute hypoxic resp failure:  -on NRB will change to [facility] -pulmonary edema on cxr -wheeze is likely 2/2 fluid -unable to diurese at this time but will diurese as soon bp recovers Pulmonary nodules:  -outpt f/u  COPD:  -cont bronchodilators  Aki:  -baseline Cr ~1 -at 1.2 today -place foley, as retaining urine at this time.  -follow indices and uop -has non obstructing renal stone on ct at presentation   MSSA bacteremia:  -awaiting sensitivities -cont cefazolin per ID -awaiting MRI once pt stabilizes (suspected discitis vs abscess) Back pain:  -pain meds prn -awaiting imaging.  -no neuro deficits at this time.  Nausea:  -zofran prn  Adrenal nodules:  -noted will need further eval  Best Practice (right click and "Reselect all SmartList Selections" daily)   Diet/type: NPO DVT prophylaxis: systemic heparin GI prophylaxis: PPI Lines: N/A Foley:  Yes, and it is still needed Code Status:  full code Last date of multidisciplinary goals of care discussion [8/11 with pt and father. ]  Labs   CBC: Recent Labs  Lab 06/18/21 1412 06/19/21 0322 06/20/21 0553  WBC 24.9*  24.9* 27.4*  NEUTROABS 21.0*  --  24.0*  HGB 13.6 12.1 12.0  HCT 43.7 38.6 37.3  MCV 90.5 90.8 90.1  PLT 231 238 [id number]    Basic Metabolic Panel: Recent Labs  Lab 06/18/21 1412 06/18/21 1805 06/19/21 0322 06/20/21 0553  NA 137  --  135 135  K 3.6  --  3.7 4.1  CL 106  --  104 102  CO2 17*  --  20* 24  GLUCOSE 117*  --  139* 145*  BUN 23  --  19 39*  CREATININE 1.15*  --  1.03* 1.23*  CALCIUM 8.5*  --  7.6* 7.7*  MG  --  2.0  --  2.5*   GFR: Estimated Creatinine Clearance: 39.5 mL/min (A) (by C-G formula based on SCr of 1.23 mg/dL (H)). Recent Labs  Lab 06/18/21 1412 06/18/21 1530 06/18/21 1808 06/19/21 0322 06/20/21 0553  PROCALCITON  --   --   --  3.28  --   WBC 24.9*  --   --  24.9* 27.4*  LATICACIDVEN  --  2.0* 1.5  --   --     Liver Function Tests: No results for input(s): AST, ALT, ALKPHOS, BILITOT, PROT, ALBUMIN in the last 168 hours. No results for input(s): LIPASE, AMYLASE in the last 168 hours. No results for input(s): AMMONIA in the last 168 hours.  ABG    Component Value Date/Time   PHART 7.270 (L) 06/19/2021 0030   PCO2ART 45.3 06/19/2021 0030   PO2ART 89.9 06/19/2021 0030   HCO3 20.2 06/19/2021 0030   TCO2 30.1 11/03/2012 0600   ACIDBASEDEF 5.6 (H) 06/19/2021 0030   O2SAT 95.7 06/19/2021 0030     Coagulation Profile: No results for input(s): INR, PROTIME in the last 168 hours.  Cardiac Enzymes: No results for input(s): CKTOTAL, CKMB, CKMBINDEX, TROPONINI in the last 168 hours.  HbA1C: Hgb A1c MFr Bld  Date/Time Value Ref Range Status  10/29/2012 01:30 PM 5.8 (H) <5.7 % Final    Comment:    (NOTE)                                                                       According to the ADA Clinical Practice Recommendations for 2011, when HbA1c is used as a screening test:  >=6.5%   Diagnostic of Diabetes Mellitus           (if abnormal result is confirmed) 5.7-6.4%   Increased risk of developing Diabetes Mellitus References:Diagnosis and  Classification of Diabetes Mellitus,Diabetes [id number] 1):S62-S69 and Standards of Medical Care in         Diabetes - 2011,Diabetes [id number] (Suppl 1):S11-S61.    CBG: Recent Labs  Lab 06/20/21 1153  GLUCAP 117*    Review of Systems:   "  I need to poop". Denies sob, cp. Does feel hr fast. Nauseous. No vomiting. Diaphoretic + back pain.   Past Medical History:  She,  has a past medical history of CAD (coronary artery disease), Cardiac arrest ([place]) (10/2012), GERD (gastroesophageal reflux disease), H1N1 influenza (10/2012), echocardiogram, Hyperlipidemia, Hypertension, Myocardial infarction [facility]), Pneumonia (10/2012), PUD (peptic ulcer disease), Syncope (1996), and Tobacco abuse.   Surgical History:   Past Surgical History:  Procedure Laterality Date   BREAST EXCISIONAL BIOPSY     left breast ? 1981   CORONARY ANGIOPLASTY WITH STENT PLACEMENT     s/p inferior STEMI c/b VF arrest and cardiogenic shock requiring IABP   ESOPHAGOGASTRODUODENOSCOPY (EGD) WITH PROPOFOL N/A 03/10/2020   Procedure: ESOPHAGOGASTRODUODENOSCOPY (EGD) WITH PROPOFOL;  Surgeon: Lavena Bullion, DO;  Location: [place];  Service: Gastroenterology;  Laterality: N/A;   HEMOSTASIS CLIP PLACEMENT  03/10/2020   Procedure: HEMOSTASIS CLIP PLACEMENT;  Surgeon: Lavena Bullion, DO;  Location: [facility];  Service: Gastroenterology;;   HEMOSTASIS CONTROL  03/10/2020   Procedure: HEMOSTASIS CONTROL;  Surgeon: Lavena Bullion, DO;  Location: [facility];  Service: Gastroenterology;;  epi   INTRA-AORTIC BALLOON PUMP INSERTION  10/29/2012   Procedure: INTRA-AORTIC BALLOON PUMP INSERTION;  Surgeon: Burnell Blanks, MD;  Location: [facility] CATH LAB;  Service: Cardiovascular;;   LEFT HEART CATHETERIZATION WITH CORONARY ANGIOGRAM N/A 10/29/2012   Procedure: LEFT HEART CATHETERIZATION WITH CORONARY ANGIOGRAM;  Surgeon: Burnell Blanks, MD;  Location: [facility] CATH LAB;  Service: Cardiovascular;  Laterality:  N/A;   PERCUTANEOUS CORONARY STENT INTERVENTION (PCI-S)  10/29/2012   Procedure: PERCUTANEOUS CORONARY STENT INTERVENTION (PCI-S);  Surgeon: Burnell Blanks, MD;  Location: [facility] CATH LAB;  Service: Cardiovascular;;   TUBAL LIGATION       Social History:   reports that she quit smoking about 8 years ago. Her smoking use included cigarettes. She has a 20.00 pack-year smoking history. She has never used smokeless tobacco. She reports that she does not drink alcohol and does not use drugs.   Family History:  Her family history includes Heart attack in her brother; Stroke in her maternal grandmother. There is no history of Colon cancer, Pancreatic cancer, Prostate cancer, Rectal cancer, or Stomach cancer.   Allergies Allergies  Allergen Reactions   Shellfish Allergy Nausea And Vomiting   Codeine Other (See Comments)    Makes me hyperactive and not able to sleep   Lidocaine Palpitations     Home Medications  Prior to Admission medications   Medication Sig Start Date End Date Taking? Authorizing Provider  acetaminophen (TYLENOL) 325 MG tablet Take 650 mg by mouth every 6 (six) hours as needed for mild pain or headache. For headache or pain   Yes [provider]  atorvastatin (LIPITOR) 80 MG tablet Take 1 tablet (80 mg total) by mouth daily at 6 PM. 11/15/12  Yes Love, Ivan Anchors, PA-C  clopidogrel (PLAVIX) 75 MG tablet Take 1 tablet (75 mg total) by mouth daily. 03/17/20  Yes Ghimire, Henreitta Leber, MD  escitalopram (LEXAPRO) 5 MG tablet Take 5 mg by mouth daily.   Yes [provider]  furosemide (LASIX) 40 MG tablet Take 40 mg by mouth daily. 07/23/20  Yes [provider]  Javier Docker Oil 500 MG CAPS Take 500 mg by mouth daily.   Yes [provider]  lisinopril (ZESTRIL) 5 MG tablet Take 5 mg by mouth daily. 07/19/20  Yes [provider]  metoprolol tartrate (LOPRESSOR) 25 MG tablet Take 1 tablet (25  mg total) by mouth 2 (two) times daily. 11/15/12  Yes Love,  Ivan Anchors, PA-C  nitroGLYCERIN (NITROSTAT) 0.4 MG SL tablet DISSOLVE ONE TABLET UNDER THE TONGUE EVERY FIVE MINUTES AS NEEDED FOR CHEST PAIN. DO NOT EXCEED A TOTAL OF THREE DOSES IN 15 MINUTES Patient taking differently: Place 0.4 mg under the tongue every 5 (five) minutes as needed for chest pain. 09/03/20  Yes Burnell Blanks, MD  pantoprazole (PROTONIX) 40 MG tablet Take 1 tablet (40 mg total) by mouth daily. 12/10/20 06/18/21 Yes Burnell Blanks, MD  PROAIR RESPICLICK [id number] [phone] Base) MCG/ACT AEPB Take 2 puffs by mouth every 6 (six) hours as needed for wheezing or shortness of breath. 12/27/19  Yes [provider]  Probiotic Product (PROBIOTIC PO) Take 1 capsule by mouth daily.   Yes [provider]  psyllium (REGULOID) 0.52 G capsule Take 0.52 g by mouth daily.   Yes [provider]  SYMBICORT 160-4.5 MCG/ACT inhaler Inhale 2 puffs into the lungs daily. 01/13/20  Yes [provider]     Critical care time: 71mn Critical care time: The patient is critically ill with multiple organ systems failure and requires high complexity decision making for assessment and support, frequent evaluation and titration of therapies, application of advanced monitoring technologies and extensive interpretation of multiple databases.  Critical care time 43 mins. This represents my time independent of the NPs time taking care of the pt. This is excluding procedures.    JAudria NineDO [facility] 06/20/2021, 2:59 PM See Amion for pager If no response to pager, please call [phone] until 1900 After 1900 please call [phone]

## 2021-06-20 NOTE — Progress Notes (Signed)
Patient has developed atrial fibrillation with rapid ventricular response. HR in the 140 to 150.  Blood pressure with MAP 80.   Placed on diltiazem drip for rate control, if no improvement or worsening hypotension will transition to IV amiodarone.  Hold on anticoagulation for now until spine MRI done.

## 2021-06-20 NOTE — Progress Notes (Addendum)
Patient not seen today. I have personally reviewed the clinical findings, laboratory findings, microbiological data and imaging studies. The assessment and treatment plan was discussed with the  Advance Practice Provider, Janene Madeira. I agree with her/his recommendations except following additions/corrections.  Patient getting transferred to MICU given A fib and hypotension for closer monitoring  Blood cultures 06/18/21 grew strep gallolyticus 1/2  sets on top of MSSA, have reached out to microbiology to r/o any error, currently both organisms covered by cefazolin   Continue cefazolin Fu repeat blood cultures  TTE pending. IF negative will need a TEE MRI L spine when feasible Will follow up with Micro regarding the strep gallolyticus. If truly positive, Will need colonoscopy at some point later Will reevaluate on 8/13  Samantha King, [place] for [place] for Infectious Disease  Date of Admission:  06/18/2021      Total days of antibiotics 2   Cefazolin 8/10 >> current          ASSESSMENT: Samantha King is a 75 y.o. female with severe sudden onset low back pain in the setting of MSSA bacteremia. Developed atrial fibrillation overnight. Repeat blood cultures drawn this morning and pending. Afebrile, wbc up 27K today. Ms. Ryon was not able to tolerate MRI with current pain and plans for anesthesia supported study (hopefully Thursday?). Reassured them that treatment for likely vertebral infection is not delayed and the MRI is only going to be helpful to confirm our suspicion of infection and to ensure no surgical management is necessary. Aside from severe pain she does not have any clinical signs of neurologic compromise that would warrant surgical intervention. We discussed what this might look like and encouraged them to report to her nursing/main team if present. Discussed typical course of treatment  for vertebral infection including expectations that pain relief is not immediate and will take a few weeks for noticeable improvement typically.   Will follow TTE results and repeated blood cultures. Apparently she is now growing strep gallolyticus in one bottle - I am skeptical these are correct and we have requested further clarification with labs. Either way covered by cefazolin.   Acute on Chronic Kidney Disease: Lab Results  Component Value Date   CREATININE 1.23 (H) 06/20/2021   CREATININE 1.03 (H) 06/19/2021   CREATININE 1.15 (H) 06/18/2021  Follow for changes that warrant dose adjustment of cefazolin   Respiratory failure: COPD exacerbation vs pulmonary edema in the setting of sepsis and new onset atrial fibrillation. Primary team managing diuretics, MIVF d/c'd. Pulmonary nodule likely not related to current sepsis (would suspect more bilateral distal pattern).   Severe pain in the setting of back infection - managed by Dr. Cathlean Sauer, she is awaiting IV fentanyl after oxycodone has not adequately controlled pain. I encouraged her to also consider a dose of xanax this afternoon (ordered PRN) to see if it will help    PLAN: Continue cefazolin Follow MRI results  We will attempt to clarify strep gallolyticus in blood with lab, but suspect error. Follow repeat blood cultures Defer central line / PICC for now as hemodynamics permit    Principal Problem:   MSSA bacteremia Active Problems:   Back pain   Sepsis ([facility])   COPD with acute exacerbation (HCC)   Acute hypoxemic respiratory failure (HCC)   Pulmonary nodules    acetaminophen  650 mg Oral QID   atorvastatin  80 mg  Oral q1800   budesonide (PULMICORT) nebulizer solution  0.25 mg Nebulization BID   cyclobenzaprine  10 mg Oral TID   escitalopram  5 mg Oral Daily   metoprolol tartrate  25 mg Oral BID   pantoprazole  40 mg Oral Daily    SUBJECTIVE: Still with severe back pain and spasms.  Her husband is concerned that  they have had delays getting MRI.    Review of Systems: Review of Systems  Constitutional:  Negative for chills and fever.  Respiratory:  Positive for shortness of breath and wheezing. Negative for cough.   Cardiovascular:  Positive for leg swelling. Negative for chest pain.  Gastrointestinal:  Negative for abdominal pain, diarrhea, nausea and vomiting.  Genitourinary:  Negative for dysuria.  Musculoskeletal:  Positive for back pain and joint pain.  Skin:  Negative for itching and rash.  Neurological:  Negative for dizziness, sensory change, focal weakness and headaches.  Psychiatric/Behavioral:  Negative for depression. The patient is not nervous/anxious.    Allergies  Allergen Reactions   Shellfish Allergy Nausea And Vomiting   Codeine Other (See Comments)    Makes me hyperactive and not able to sleep   Lidocaine Palpitations    OBJECTIVE: Vitals:   06/20/21 1240 06/20/21 1245 06/20/21 1250 06/20/21 1255  BP: (!) 87/73 101/67 107/85 (!) 90/55  Pulse: 62 72 (!) 135   Resp: 20 (!) '21 15 17  '$ Temp:      TempSrc:      SpO2:  91% 91%   Weight:      Height:       Body mass index is 33.12 kg/m.  Physical Exam Vitals reviewed.  Constitutional:      Appearance: She is ill-appearing.     Comments: In severe pain intermittently shouting out in pain.   HENT:     Mouth/Throat:     Mouth: Mucous membranes are dry.     Pharynx: Oropharynx is clear.  Cardiovascular:     Rate and Rhythm: Regular rhythm. Tachycardia present.     Heart sounds: No murmur heard. Pulmonary:     Effort: Pulmonary effort is normal.     Breath sounds: Wheezing present.     Comments: On 4-5 LPM oxygen now. Difficult to truly assess pleath on POx monitor  Abdominal:     General: Bowel sounds are normal.     Palpations: Abdomen is soft.  Skin:    General: Skin is warm and dry.     Capillary Refill: Capillary refill takes less than 2 seconds.  Neurological:     Mental Status: She is alert and  oriented to person, place, and time.     Sensory: No sensory deficit.     Motor: No weakness.    Lab Results Lab Results  Component Value Date   WBC 27.4 (H) 06/20/2021   HGB 12.0 06/20/2021   HCT 37.3 06/20/2021   MCV 90.1 06/20/2021   PLT 288 06/20/2021    Lab Results  Component Value Date   CREATININE 1.23 (H) 06/20/2021   BUN 39 (H) 06/20/2021   NA 135 06/20/2021   K 4.1 06/20/2021   CL 102 06/20/2021   CO2 24 06/20/2021    Lab Results  Component Value Date   ALT 27 03/10/2020   AST 24 03/10/2020   ALKPHOS 51 03/10/2020   BILITOT 0.9 03/10/2020     Microbiology: Recent Results (from the past 240 hour(s))  Blood culture (routine x 2)     Status: Abnormal (  Preliminary result)   Collection Time: 06/18/21  3:30 PM   Specimen: BLOOD  Result Value Ref Range Status   Specimen Description BLOOD LEFT ANTECUBITAL  Final   Special Requests   Final    BOTTLES DRAWN AEROBIC AND ANAEROBIC Blood Culture adequate volume   Culture  Setup Time   Final    GRAM POSITIVE COCCI IN BOTH AEROBIC AND ANAEROBIC BOTTLES CRITICAL RESULT CALLED TO, READ BACK BY AND VERIFIED WITH: A MEYER,PHARMD'@0708'$  06/19/21 [place]    Culture (A)  Final    STAPHYLOCOCCUS AUREUS SUSCEPTIBILITIES TO FOLLOW Performed at [facility], [place] [address], [place], [place] [address]    Report Status PENDING  Incomplete  Blood culture (routine x 2)     Status: Abnormal (Preliminary result)   Collection Time: 06/18/21  3:30 PM   Specimen: BLOOD  Result Value Ref Range Status   Specimen Description BLOOD SITE NOT SPECIFIED  Final   Special Requests   Final    BOTTLES DRAWN AEROBIC AND ANAEROBIC Blood Culture results may not be optimal due to an inadequate volume of blood received in culture bottles   Culture  Setup Time   Final    GRAM POSITIVE COCCI IN BOTH AEROBIC AND ANAEROBIC BOTTLES CRITICAL VALUE NOTED.  VALUE IS CONSISTENT WITH PREVIOUSLY REPORTED AND CALLED VALUE.    Culture (A)  Final     STAPHYLOCOCCUS AUREUS STREPTOCOCCUS GALLOLYTICUS THE SIGNIFICANCE OF ISOLATING THIS ORGANISM FROM A SINGLE SET OF BLOOD CULTURES WHEN MULTIPLE SETS ARE DRAWN IS UNCERTAIN. PLEASE NOTIFY THE MICROBIOLOGY DEPARTMENT WITHIN ONE WEEK IF SPECIATION AND SENSITIVITIES ARE REQUIRED. CRITICAL RESULT CALLED TO, READ BACK BY AND VERIFIED WITH: [place] L. [id number] Performed at [facility], [place] [address], [place], [place] [address]    Report Status PENDING  Incomplete  Blood Culture ID Panel (Reflexed)     Status: Abnormal   Collection Time: 06/18/21  3:30 PM  Result Value Ref Range Status   Enterococcus faecalis NOT DETECTED NOT DETECTED Final   Enterococcus Faecium NOT DETECTED NOT DETECTED Final   Listeria monocytogenes NOT DETECTED NOT DETECTED Final   Staphylococcus species DETECTED (A) NOT DETECTED Final    Comment: CRITICAL RESULT CALLED TO, READ BACK BY AND VERIFIED WITH: A MEYER,PHARMD'@0712'$  06/19/21 MKELLY    Staphylococcus aureus (BCID) DETECTED (A) NOT DETECTED Final    Comment: CRITICAL RESULT CALLED TO, READ BACK BY AND VERIFIED WITH: A MEYER,PHARMD'@0712'$  06/19/21 [place]    Staphylococcus epidermidis NOT DETECTED NOT DETECTED Final   Staphylococcus lugdunensis NOT DETECTED NOT DETECTED Final   Streptococcus species NOT DETECTED NOT DETECTED Final   Streptococcus agalactiae NOT DETECTED NOT DETECTED Final   Streptococcus pneumoniae NOT DETECTED NOT DETECTED Final   Streptococcus pyogenes NOT DETECTED NOT DETECTED Final   A.calcoaceticus-baumannii NOT DETECTED NOT DETECTED Final   Bacteroides fragilis NOT DETECTED NOT DETECTED Final   Enterobacterales NOT DETECTED NOT DETECTED Final   Enterobacter cloacae complex NOT DETECTED NOT DETECTED Final   Escherichia coli NOT DETECTED NOT DETECTED Final   Klebsiella aerogenes NOT DETECTED NOT DETECTED Final   Klebsiella oxytoca NOT DETECTED NOT DETECTED Final   Klebsiella pneumoniae NOT DETECTED NOT DETECTED Final   Proteus species  NOT DETECTED NOT DETECTED Final   Salmonella species NOT DETECTED NOT DETECTED Final   Serratia marcescens NOT DETECTED NOT DETECTED Final   Haemophilus influenzae NOT DETECTED NOT DETECTED Final   Neisseria meningitidis NOT DETECTED NOT DETECTED Final   Pseudomonas aeruginosa NOT DETECTED  NOT DETECTED Final   Stenotrophomonas maltophilia NOT DETECTED NOT DETECTED Final   Candida albicans NOT DETECTED NOT DETECTED Final   Candida auris NOT DETECTED NOT DETECTED Final   Candida glabrata NOT DETECTED NOT DETECTED Final   Candida krusei NOT DETECTED NOT DETECTED Final   Candida parapsilosis NOT DETECTED NOT DETECTED Final   Candida tropicalis NOT DETECTED NOT DETECTED Final   Cryptococcus neoformans/gattii NOT DETECTED NOT DETECTED Final   Meth resistant mecA/C and MREJ NOT DETECTED NOT DETECTED Final    Comment: Performed at [facility], [place] [address], [place], [place] [address]  Resp Panel by RT-PCR (Flu A&B, Covid) Nasopharyngeal Swab     Status: None   Collection Time: 06/18/21  4:59 PM   Specimen: Nasopharyngeal Swab; Nasopharyngeal(NP) swabs in vial transport medium  Result Value Ref Range Status   SARS Coronavirus 2 by RT PCR NEGATIVE NEGATIVE Final    Comment: (NOTE) SARS-CoV-2 target nucleic acids are NOT DETECTED.  The SARS-CoV-2 RNA is generally detectable in upper respiratory specimens during the acute phase of infection. The lowest concentration of SARS-CoV-2 viral copies this assay can detect is 138 copies/mL. A negative result does not preclude SARS-Cov-2 infection and should not be used as the sole basis for treatment or other patient management decisions. A negative result may occur with  improper specimen collection/handling, submission of specimen other than nasopharyngeal swab, presence of viral mutation(s) within the areas targeted by this assay, and inadequate number of viral copies(<138 copies/mL). A negative result must be combined with clinical  observations, patient history, and epidemiological information. The expected result is Negative.  Fact Sheet for Patients:  EntrepreneurPulse.com.au  Fact Sheet for Healthcare Providers:  IncredibleEmployment.be  This test is no t yet approved or cleared by the [place] FDA and  has been authorized for detection and/or diagnosis of SARS-CoV-2 by FDA under an Emergency Use Authorization (EUA). This EUA will remain  in effect (meaning this test can be used) for the duration of the COVID-19 declaration under Section 564(b)(1) of the Act, 21 U.S.C.section 360bbb-3(b)(1), unless the authorization is terminated  or revoked sooner.       Influenza A by PCR NEGATIVE NEGATIVE Final   Influenza B by PCR NEGATIVE NEGATIVE Final    Comment: (NOTE) The Xpert Xpress SARS-CoV-2/FLU/RSV plus assay is intended as an aid in the diagnosis of influenza from Nasopharyngeal swab specimens and should not be used as a sole basis for treatment. Nasal washings and aspirates are unacceptable for Xpert Xpress SARS-CoV-2/FLU/RSV testing.  Fact Sheet for Patients: EntrepreneurPulse.com.au  Fact Sheet for Healthcare Providers: IncredibleEmployment.be  This test is not yet approved or cleared by the [place] FDA and has been authorized for detection and/or diagnosis of SARS-CoV-2 by FDA under an Emergency Use Authorization (EUA). This EUA will remain in effect (meaning this test can be used) for the duration of the COVID-19 declaration under Section 564(b)(1) of the Act, 21 U.S.C. section 360bbb-3(b)(1), unless the authorization is terminated or revoked.  Performed at [facility], [place] [address], [place], [place] [address]   Urine Culture     Status: Abnormal   Collection Time: 06/18/21 11:50 PM   Specimen: Urine, Clean Catch  Result Value Ref Range Status   Specimen Description URINE, CLEAN CATCH  Final    Special Requests NONE  Final   Culture (A)  Final    <10,000 COLONIES/mL INSIGNIFICANT GROWTH Performed at [facility], [address], [place], [place]  [id number]    Report Status 06/20/2021 FINAL  Final  Culture, blood (routine x 2)     Status: None (Preliminary result)   Collection Time: 06/20/21  5:53 AM   Specimen: BLOOD  Result Value Ref Range Status   Specimen Description BLOOD RIGHT ANTECUBITAL  Final   Special Requests   Final    BOTTLES DRAWN AEROBIC AND ANAEROBIC Blood Culture adequate volume   Culture   Final    NO GROWTH <12 HOURS Performed at [facility], [address], [place], [place] [address]    Report Status PENDING  Incomplete    Janene Madeira, MSN, NP-C [place] for [facility] [place] Cell: [phone] Pager: [phone]  06/20/2021  1:00 PM

## 2021-06-20 NOTE — Progress Notes (Signed)
ANTICOAGULATION CONSULT NOTE - Follow Up Consult  Pharmacy Consult for heparin Indication: atrial fibrillation  Allergies  Allergen Reactions   Shellfish Allergy Nausea And Vomiting   Codeine Other (See Comments)    Makes me hyperactive and not able to sleep   Lidocaine Palpitations    Patient Measurements: Height: '5\' 3"'$  (160 cm) Weight: 84.8 kg (186 lb 15.2 oz) IBW/kg (Calculated) : 52.4 Heparin Dosing Weight: 70kg  Vital Signs: Temp: 98.6 F (37 C) (08/11 1200) Temp Source: Oral (08/11 1200) BP: 108/66 (08/11 1315) Pulse Rate: 134 (08/11 1305)  Labs: Recent Labs    06/18/21 1412 06/18/21 1530 06/19/21 0322 06/20/21 0553  HGB 13.6  --  12.1 12.0  HCT 43.7  --  38.6 37.3  PLT 231  --  238 288  CREATININE 1.15*  --  1.03* 1.23*  TROPONINIHS 47* 49*  --   --     Estimated Creatinine Clearance: 39.5 mL/min (A) (by C-G formula based on SCr of 1.23 mg/dL (H)).   Assessment: 75 year old female with newly diagnosed afib with RVR this afternoon. New orders to start IV heparin and hold off on oral anticoagulation until all procedures are completed. Will go ahead and check benefits for apixaban. It does not appear that she was on anticoagulation prior to admit. CBC has been stable this admit and she was not order pharmacological prophylaxis.   Goal of Therapy:  Heparin level 0.3-0.7 units/ml Monitor platelets by anticoagulation protocol: Yes   Plan:  Give 4000 units bolus x 1 Start heparin infusion at 1000 units/hr Check anti-Xa level in 8 hours and daily while on heparin Continue to monitor H&H and platelets  Erin Hearing PharmD., BCPS Clinical Pharmacist 06/20/2021 1:35 PM

## 2021-06-21 ENCOUNTER — Inpatient Hospital Stay (HOSPITAL_COMMUNITY): Payer: Medicare Other

## 2021-06-21 DIAGNOSIS — M7989 Other specified soft tissue disorders: Secondary | ICD-10-CM | POA: Diagnosis not present

## 2021-06-21 DIAGNOSIS — M545 Low back pain, unspecified: Secondary | ICD-10-CM | POA: Diagnosis not present

## 2021-06-21 DIAGNOSIS — R7881 Bacteremia: Secondary | ICD-10-CM

## 2021-06-21 DIAGNOSIS — J9601 Acute respiratory failure with hypoxia: Secondary | ICD-10-CM | POA: Diagnosis not present

## 2021-06-21 LAB — HEPARIN LEVEL (UNFRACTIONATED): Heparin Unfractionated: 0.67 IU/mL (ref 0.30–0.70)

## 2021-06-21 LAB — ECHOCARDIOGRAM COMPLETE
AR max vel: 2.84 cm2
AV Area VTI: 2.88 cm2
AV Area mean vel: 2.76 cm2
AV Mean grad: 11 mmHg
AV Peak grad: 19 mmHg
Ao pk vel: 2.18 m/s
Area-P 1/2: 3.6 cm2
Calc EF: 66.5 %
Height: 63 in
P 1/2 time: 515 msec
S' Lateral: 3.4 cm
Single Plane A2C EF: 60.4 %
Single Plane A4C EF: 68.2 %
Weight: 2991.2 oz

## 2021-06-21 LAB — CBC WITH DIFFERENTIAL/PLATELET
Abs Immature Granulocytes: 0.37 10*3/uL — ABNORMAL HIGH (ref 0.00–0.07)
Basophils Absolute: 0.1 10*3/uL (ref 0.0–0.1)
Basophils Relative: 0 %
Eosinophils Absolute: 0.1 10*3/uL (ref 0.0–0.5)
Eosinophils Relative: 0 %
HCT: 32.3 % — ABNORMAL LOW (ref 36.0–46.0)
Hemoglobin: 10.3 g/dL — ABNORMAL LOW (ref 12.0–15.0)
Immature Granulocytes: 1 %
Lymphocytes Relative: 6 %
Lymphs Abs: 2.1 10*3/uL (ref 0.7–4.0)
MCH: 28.9 pg (ref 26.0–34.0)
MCHC: 31.9 g/dL (ref 30.0–36.0)
MCV: 90.5 fL (ref 80.0–100.0)
Monocytes Absolute: 3.6 10*3/uL — ABNORMAL HIGH (ref 0.1–1.0)
Monocytes Relative: 11 %
Neutro Abs: 27.3 10*3/uL — ABNORMAL HIGH (ref 1.7–7.7)
Neutrophils Relative %: 82 %
Platelets: UNDETERMINED 10*3/uL (ref 150–400)
RBC: 3.57 MIL/uL — ABNORMAL LOW (ref 3.87–5.11)
RDW: 18.4 % — ABNORMAL HIGH (ref 11.5–15.5)
WBC: 33.5 10*3/uL — ABNORMAL HIGH (ref 4.0–10.5)
nRBC: 0.1 % (ref 0.0–0.2)

## 2021-06-21 LAB — BASIC METABOLIC PANEL
Anion gap: 11 (ref 5–15)
BUN: 51 mg/dL — ABNORMAL HIGH (ref 8–23)
CO2: 21 mmol/L — ABNORMAL LOW (ref 22–32)
Calcium: 7.3 mg/dL — ABNORMAL LOW (ref 8.9–10.3)
Chloride: 101 mmol/L (ref 98–111)
Creatinine, Ser: 2.03 mg/dL — ABNORMAL HIGH (ref 0.44–1.00)
GFR, Estimated: 25 mL/min — ABNORMAL LOW (ref >60)
Glucose, Bld: 141 mg/dL — ABNORMAL HIGH (ref 70–99)
Potassium: 3.8 mmol/L (ref 3.5–5.1)
Sodium: 133 mmol/L — ABNORMAL LOW (ref 135–145)

## 2021-06-21 LAB — CULTURE, BLOOD (ROUTINE X 2): Special Requests: ADEQUATE

## 2021-06-21 IMAGING — MR MR ABDOMEN W/O CM
10 series · 48 of 48 positions shown · non-contrast
Comparison: CT abdomen/pelvis dated [DATE]. CT abdomen/pelvis
dated [DATE].

CLINICAL DATA: Adrenal nodule

EXAM:
MRI ABDOMEN WITHOUT CONTRAST
TECHNIQUE: Multiplanar multisequence MR imaging was performed without the
administration of intravenous contrast.

[Series 4: cor haste · coronal · 6.0mm · 1.25mm/px · 3 of 38 slices shown]
[im 1/38]
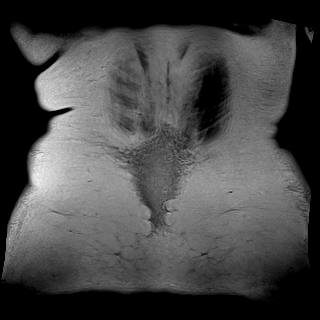
[im 19/38]
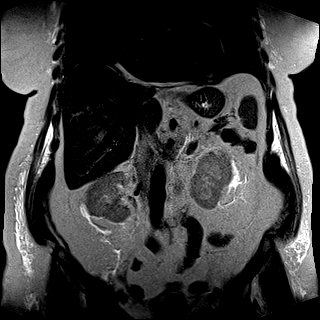
[im 38/38]
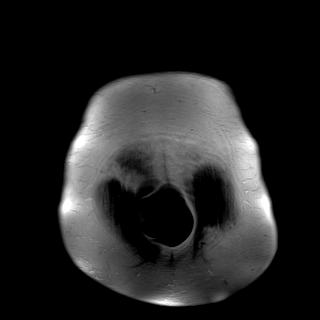

[Series 6: ax haste · axial · 6.0mm · 1.25mm/px · z∈[-306,-40]mm · 3 of 38 slices shown]
[im 1/38]
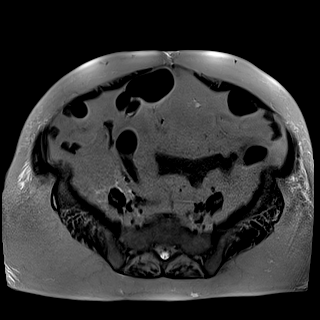
[im 19/38]
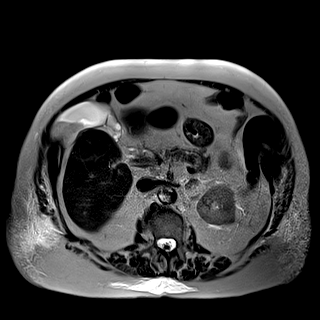
[im 38/38]
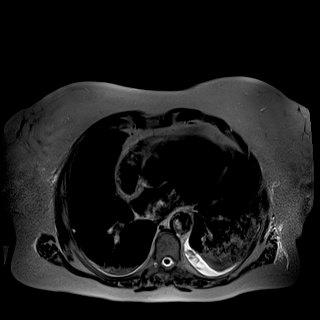

[Series 7: T2 fat-sat · axial · 6.0mm · 1.56mm/px · z∈[-306,-40]mm · 3 of 38 slices shown]
[im 1/38]
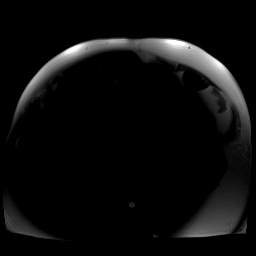
[im 19/38]
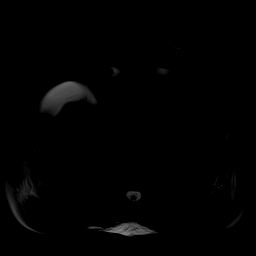
[im 38/38]
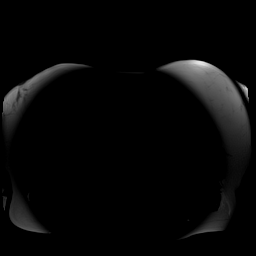

[Series 8: axial in-opp · axial · 4.0mm · 1.32mm/px · z∈[-298,-46]mm · 5 of 64 slices shown (1 of 2)]
[im 1/64]
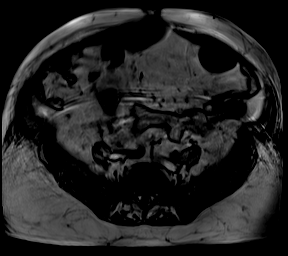
[im 16/64]
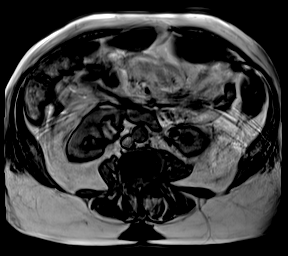
[im 32/64]
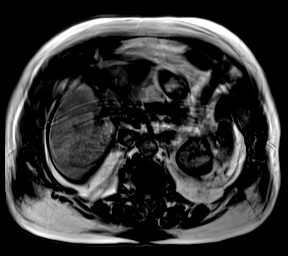
[im 48/64]
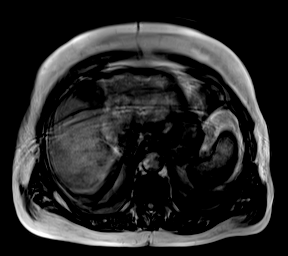
[im 64/64]
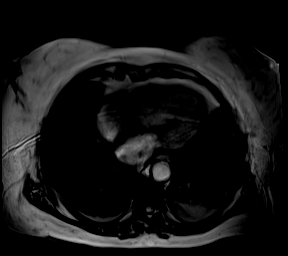

[Series 8: axial in-opp · axial · 4.0mm · 1.32mm/px · z∈[-298,-46]mm · 5 of 64 slices shown (2 of 2)]
[im 1/64]
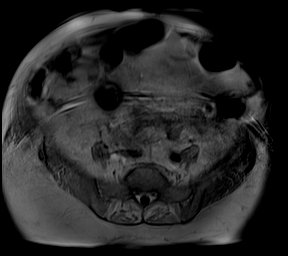
[im 16/64]
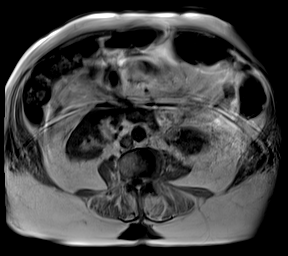
[im 32/64]
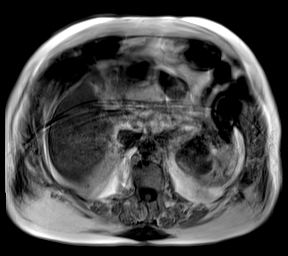
[im 48/64]
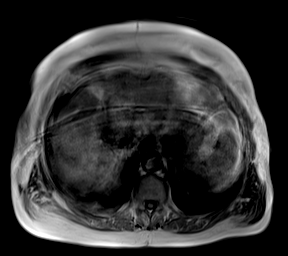
[im 64/64]
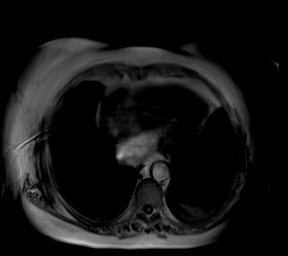

[Series 9: DWI · axial · 6.0mm · 1.49mm/px · z∈[-306,-40]mm · 9 of 114 slices shown (1 of 2)]
[im 1/114]
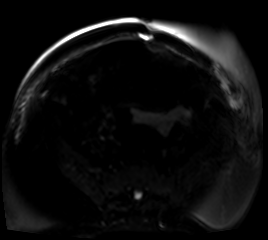
[im 15/114]
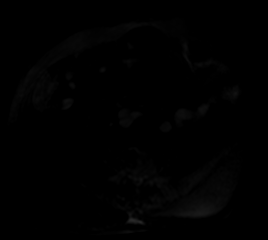
[im 29/114]
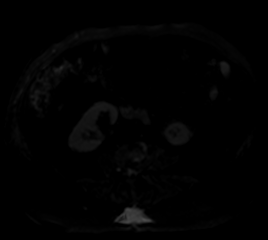
[im 43/114]
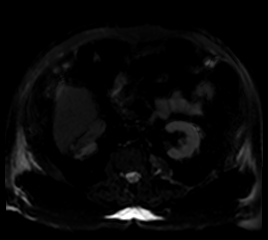
[im 57/114]
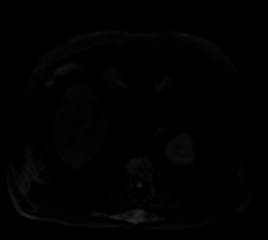
[im 71/114]
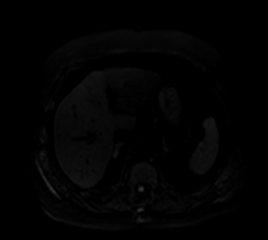
[im 85/114]
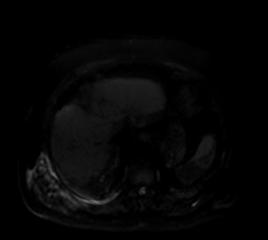
[im 99/114]
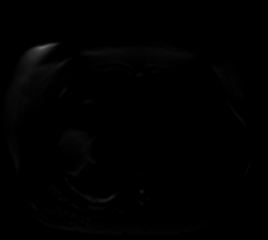
[im 114/114]
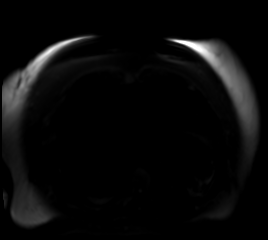

[Series 10: DWI · axial · 6.0mm · 1.49mm/px · z∈[-306,-40]mm · 3 of 38 slices shown (2 of 2)]
[im 1/38]
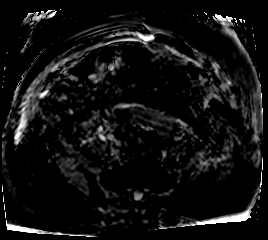
[im 19/38]
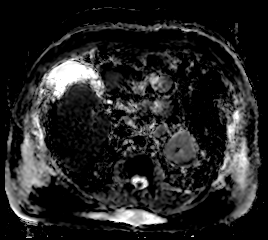
[im 38/38]
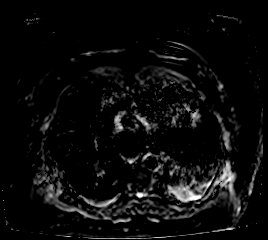

[Series 11: cor in-opp · coronal · 3.0mm · 1.56mm/px · 6 of 80 slices shown (1 of 2)]
[im 1/80]
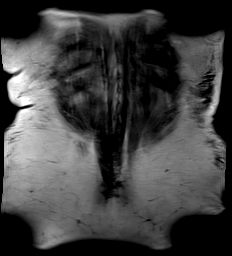
[im 16/80]
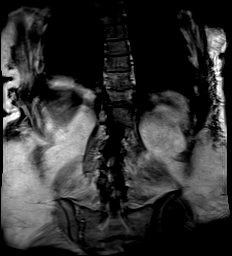
[im 32/80]
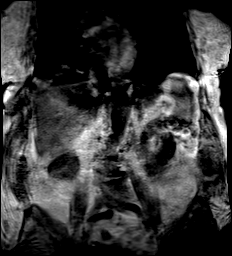
[im 48/80]
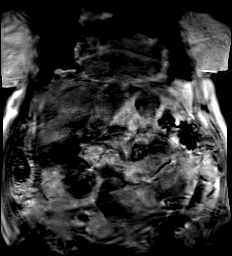
[im 64/80]
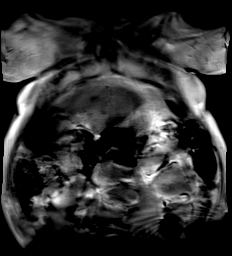
[im 80/80]
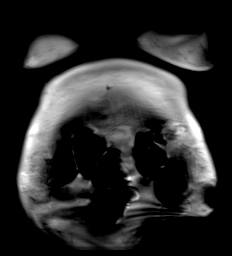

[Series 11: cor in-opp · coronal · 3.0mm · 1.56mm/px · 6 of 80 slices shown (2 of 2)]
[im 1/80]
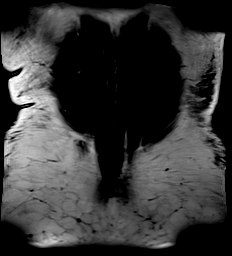
[im 16/80]
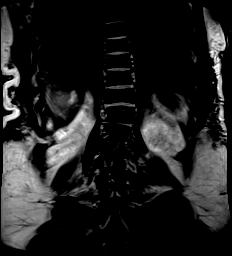
[im 32/80]
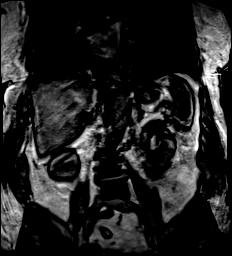
[im 48/80]
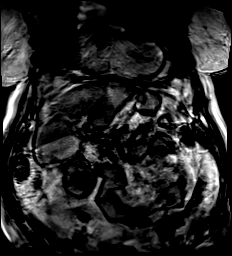
[im 64/80]
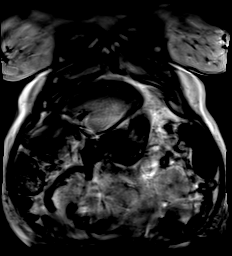
[im 80/80]
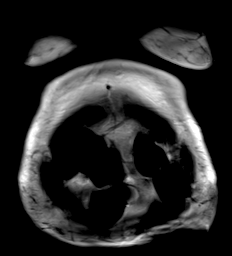

[Series 12: t1_[id number]_bh_pre · axial · 4.0mm · 1.56mm/px · z∈[-298,-46]mm · 5 of 64 slices shown]
[im 1/64]
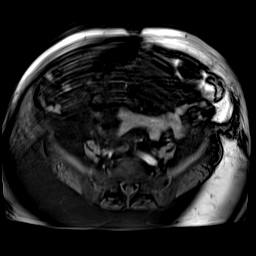
[im 16/64]
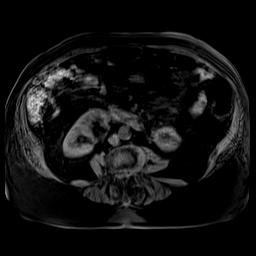
[im 32/64]
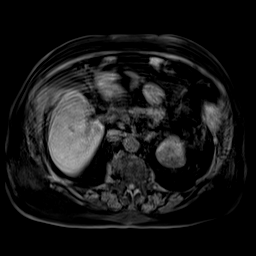
[im 48/64]
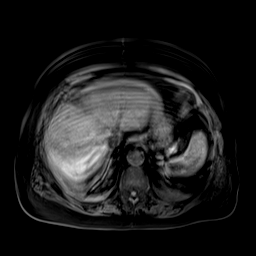
[im 64/64]
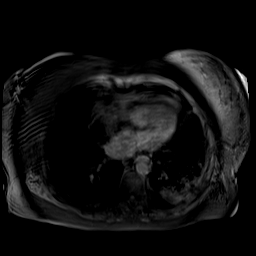

[48 of 48 positions shown; findings below may reference images not displayed]

FINDINGS: Motion degraded images.

Lower chest: Patchy left lower lobe opacity, suspicious for
pneumonia. Small bilateral pleural effusions.

Hepatobiliary: Liver is within normal limits.

Layering gallbladder sludge (series 6/image 21), without associated
fluid or changes. No intrahepatic or extrahepatic duct dilatation.

Pancreas:  Within normal limits.

Spleen:  Within normal limits.

Adrenals/Urinary Tract: In/opposed phase imaging is markedly limited
due to motion degradation, however, signal loss within the two
dominant left adrenal nodules is suspected (series 8/images 29 and
31), compatible with benign adrenal adenomas. Additionally, these
nodules are similar (although slightly smaller) on the [IA] CT,
confirming the benign etiology.

Right adrenal gland is within normal limits.

Kidneys are within normal limits.  No hydronephrosis.

Stomach/Bowel: Stomach is within normal limits.

Visualized bowel is grossly unremarkable.

Vascular/Lymphatic:  No evidence of abdominal aortic aneurysm.

No suspicious abdominal lymphadenopathy.

Other:  No abdominal ascites.

Musculoskeletal: No focal osseous lesions.
IMPRESSION: Motion degraded images.

Two benign left adrenal adenomas.

Left lower lobe pneumonia.  Small bilateral pleural effusions.

## 2021-06-21 IMAGING — MR MR THORACIC SPINE W/O CM
5 of 6 series · 28 of 48 positions shown · non-contrast
Comparison: CT [DATE]

CLINICAL DATA: Epidural abscess.

EXAM:
MRI THORACIC SPINE WITHOUT CONTRAST
TECHNIQUE: Multiplanar, multisequence MR imaging of the thoracic spine was
performed. No intravenous contrast was administered.

[Series 14: T1 · sagittal · 6.0mm · 1.46mm/px · 3 of 13 slices shown (1 of 2)]
[im 1/13]
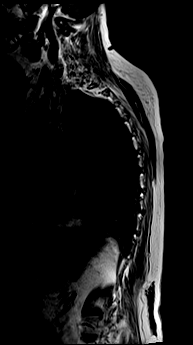
[im 7/13]
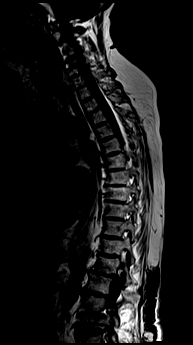
[im 13/13]
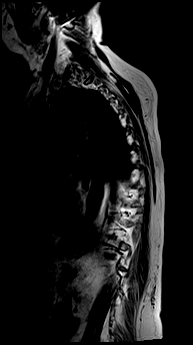

[Series 15: T2 · sagittal · 3.0mm · 0.82mm/px · 7 of 21 slices shown (1 of 2)]
[im 1/21]
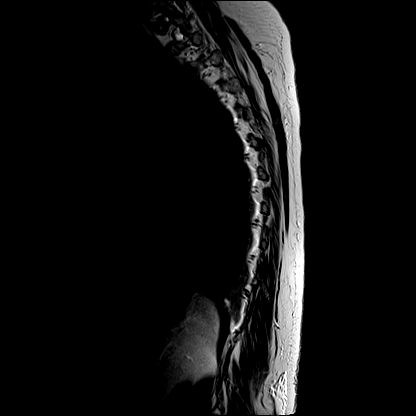
[im 4/21]
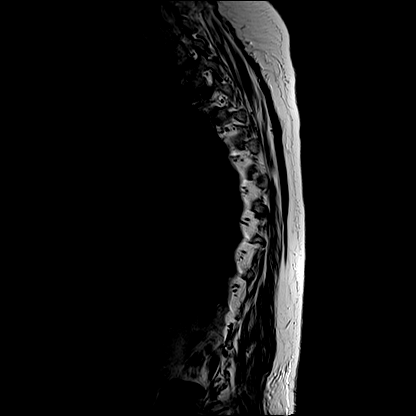
[im 7/21]
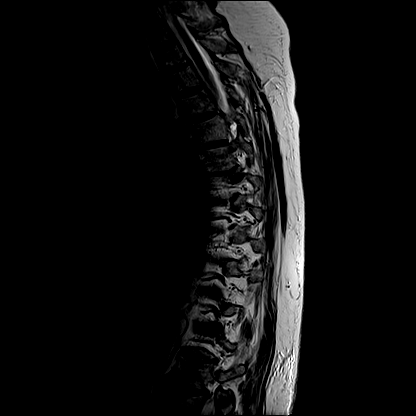
[im 11/21]
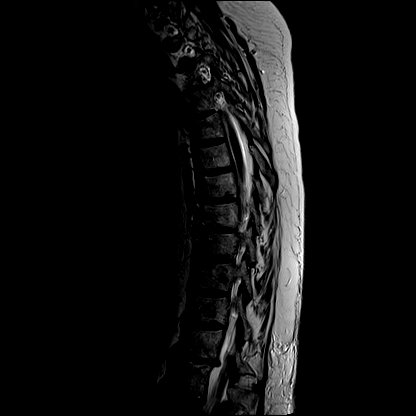
[im 14/21]
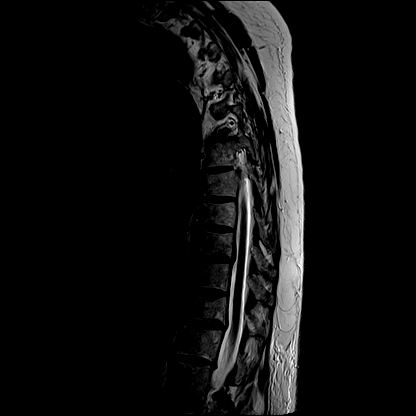
[im 17/21]
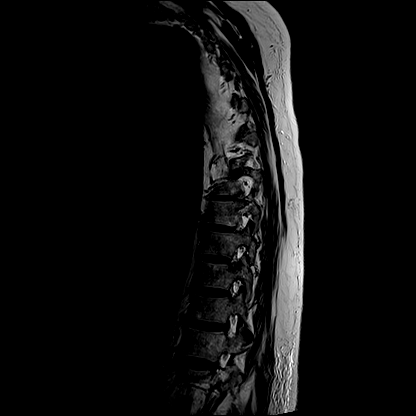
[im 21/21]
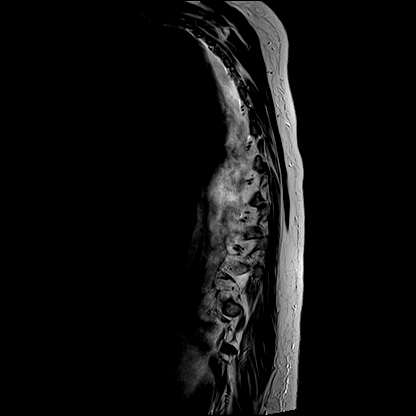

[Series 16: T1 · sagittal · 3.0mm · 0.82mm/px · 7 of 21 slices shown (2 of 2)]
[im 1/21]
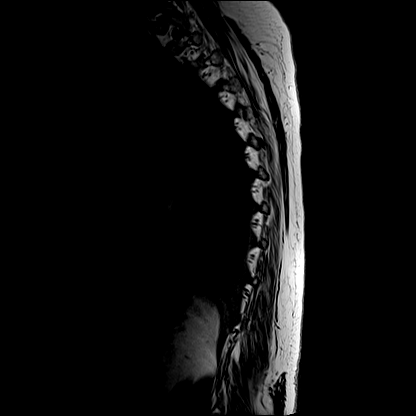
[im 4/21]
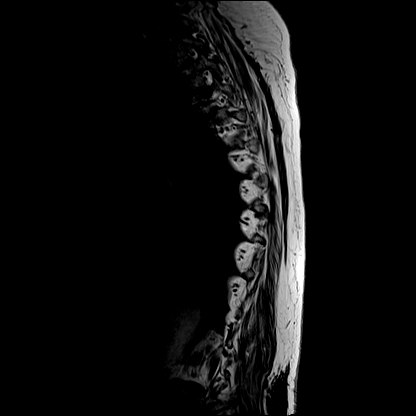
[im 7/21]
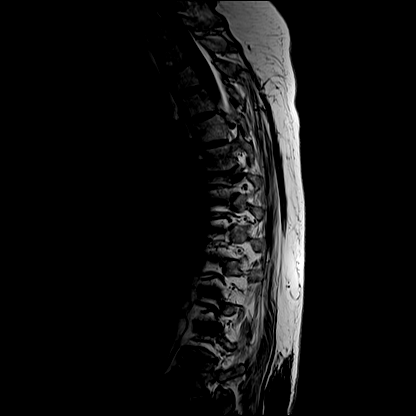
[im 11/21]
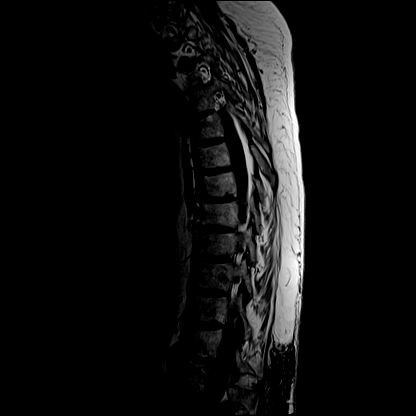
[im 14/21]
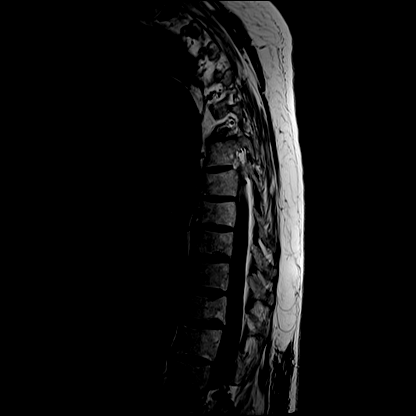
[im 17/21]
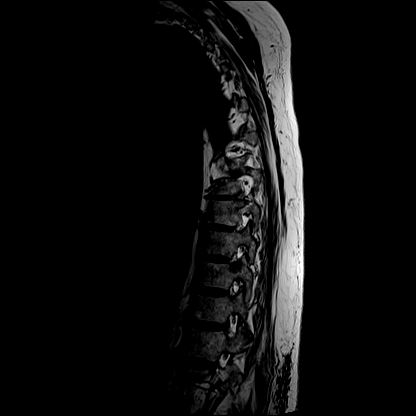
[im 21/21]
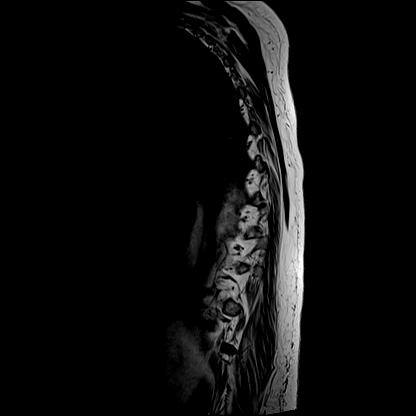

[Series 17: STIR · sagittal · 3.0mm · 0.41mm/px · 2 of 21 slices shown]
[im 1/21]
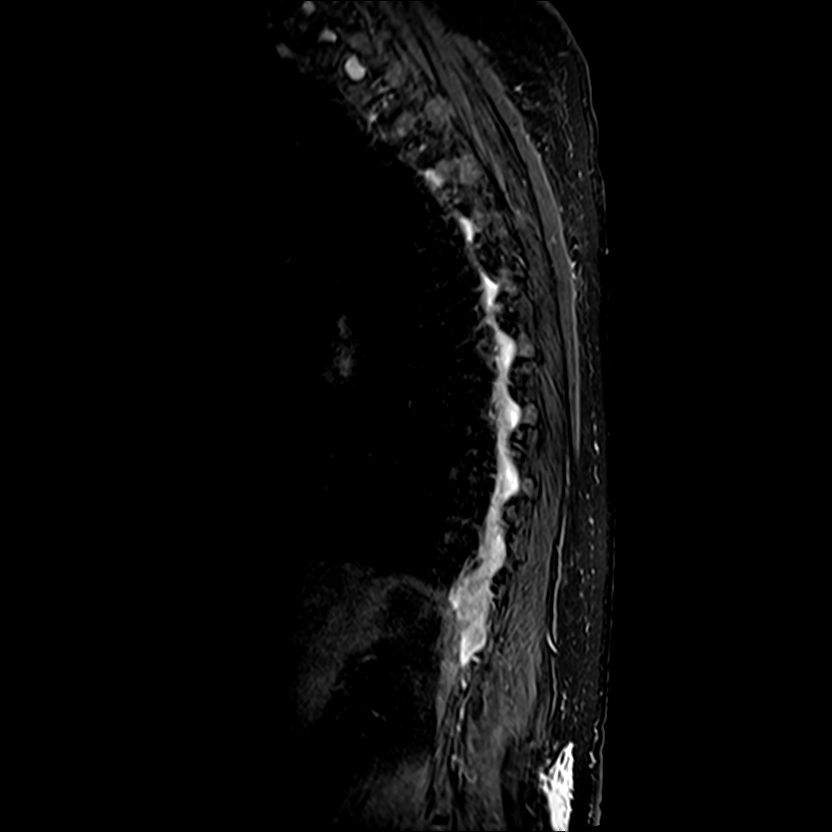
[im 4/21]
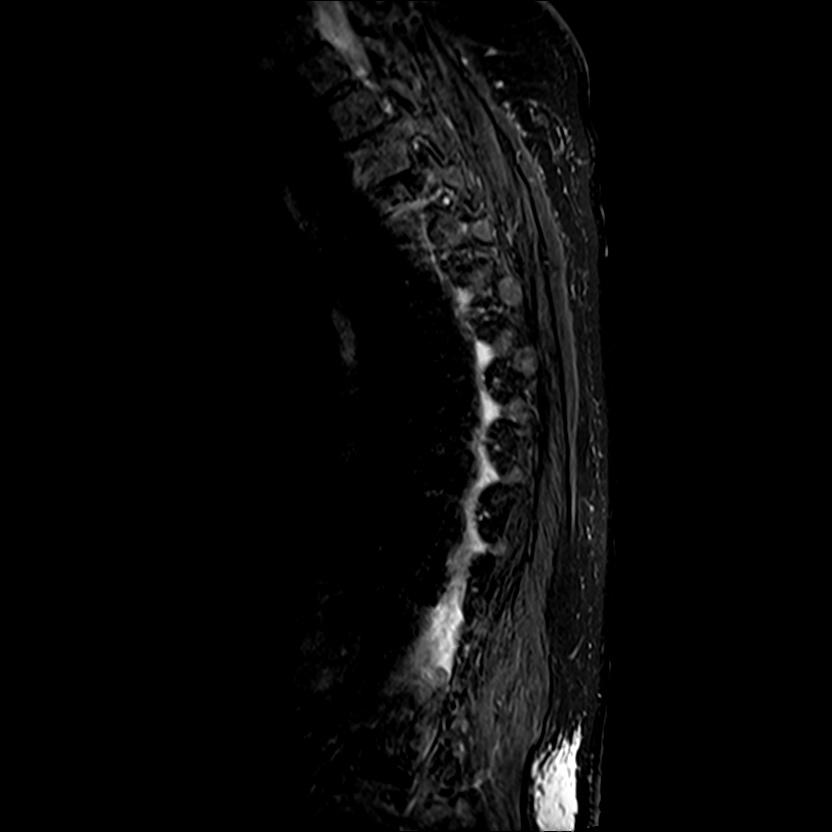

[Series 18: T2 · axial · 5.0mm · 0.62mm/px · z∈[-315,-84]mm · 9 of 39 slices shown (2 of 2)]
[im 1/39]
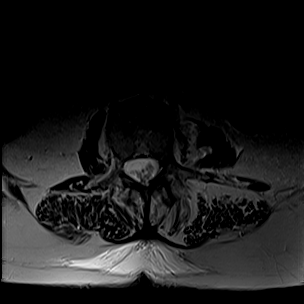
[im 7/39]
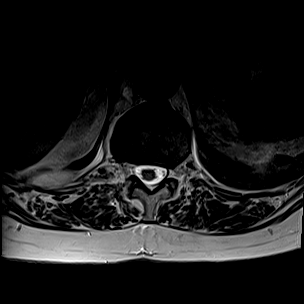
[im 11/39]
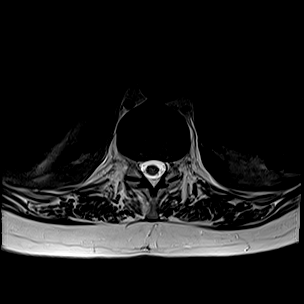
[im 18/39]
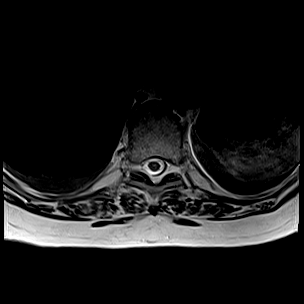
[im 21/39]
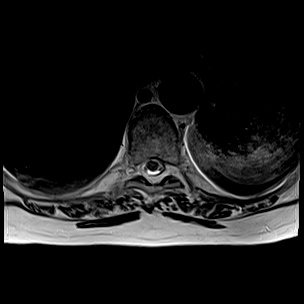
[im 28/39]
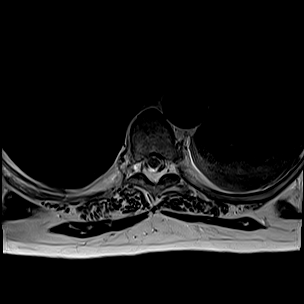
[im 32/39]
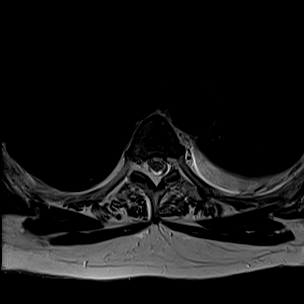
[im 35/39]
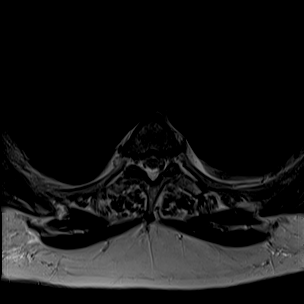
[im 39/39]
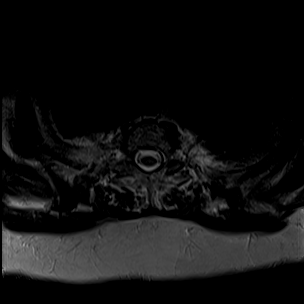

[28 of 48 positions shown; findings below may reference images not displayed]

FINDINGS: Alignment: Upper thoracic curvature convex to the right and lower
thoracic curvature convex to the left. 2 mm retrolisthesis T12-L1.

Vertebrae: No fracture or focal bone lesion.

Cord:  No cord compression or focal cord lesion.

Paraspinal and other soft tissues: Bilateral pleural effusions
layering dependently, larger on the left than the right.

Disc levels:

No significant disc level pathology at T8-9 or above. No stenosis of
the canal or foramina.

T9-10: Shallow central disc herniation with slight upward migration
behind T9. No compressive stenosis.

T10-11: Normal interspace.

T11-12: Mild bulging of the disc.  No compressive stenosis.

T12-L1: Disc degeneration more pronounced on the right. Endplate
osteophytes and bulging of the disc. Mild foraminal narrowing on the
right but no likely neural compression.

No finding to suggest infection in the thoracic spine.
IMPRESSION: Thoracic scoliotic curvature. No significant finding at T11-12 or
above. Right-sided predominant disc degeneration at T12-L1, but
without apparent neural compression. Findings at this level could
relate to back pain.

No finding in the thoracic region to suggest spinal infection.

## 2021-06-21 MED ORDER — ACETAMINOPHEN 325 MG PO TABS
650.0000 mg | ORAL_TABLET | ORAL | Status: DC | PRN
  Administered 2021-06-21: 650 mg via ORAL
  Filled 2021-06-21: qty 2

## 2021-06-21 MED ORDER — CEFAZOLIN SODIUM-DEXTROSE 2-4 GM/100ML-% IV SOLN
2.0000 g | Freq: Two times a day (BID) | INTRAVENOUS | Status: DC
  Administered 2021-06-21 – 2021-06-22 (×3): 2 g via INTRAVENOUS
  Filled 2021-06-21 (×4): qty 100

## 2021-06-21 MED ORDER — HEPARIN SODIUM (PORCINE) 5000 UNIT/ML IJ SOLN
5000.0000 [IU] | Freq: Three times a day (TID) | INTRAMUSCULAR | Status: DC
  Administered 2021-06-21 – 2021-06-25 (×13): 5000 [IU] via SUBCUTANEOUS
  Filled 2021-06-21 (×13): qty 1

## 2021-06-21 NOTE — Progress Notes (Signed)
PT Cancellation Note  Patient Details Name: Samantha King MRN: [id number] DOB: Dec 29, 1945   Cancelled Treatment:    Reason Eval/Treat Not Completed: Medical issues which prohibited therapy - Pt transferred to ICU due to afib with rvr, hypotension req pressors and hypoxemia req hhfnc. RN requests hold today, will check back.   Stacie Glaze, PT DPT Acute Rehabilitation Services Pager [phone]  Office [phone]    Louis Matte 06/21/2021, 9:35 AM

## 2021-06-21 NOTE — Progress Notes (Signed)
*  PRELIMINARY RESULTS* Echocardiogram 2D Echocardiogram has been performed.  Luisa Hart RDCS 06/21/2021, 11:34 AM

## 2021-06-21 NOTE — Progress Notes (Addendum)
NAME:  Samantha King, MRN:  [id number], DOB:  04/10/1946, LOS: 3 ADMISSION DATE:  06/18/2021, CONSULTATION DATE:  06/20/21 REFERRING MD:  TRH, CHIEF COMPLAINT:  afib rvr and shock   History of Present Illness:  75 yo female who was in normal state of health presented 2 days ago with sudden onset back pain. Per pt's husband they had been to a movie on 06/14/2021 and began to have back pain on 8/6 and thought it was due musculoskeletal pain due to adjusting the seat during the movie.  However back pain progressively worsened and she was unable to move 2/2 back pain and developed generalized weakness.  Pt was admitted found to have mssa bacteremia. She was started on appropriate therapy and was awaiting imaging of back. Unfortunately on 8/11 pt decompensated and req transfer to ICU 2/2 afib with rvr, hypotension req pressors and hypoxemia req hhfnc.  Pertinent  Medical History  CAD  H/o vf arrest Hyperlipidemia Htn Hfpef COPD  Significant Hospital Events: Including procedures, antibiotic start and stop dates in addition to other pertinent events   8/10: ID consulted 2/2 MSSA bacteremia 8/11: transferred to ICU for afib rvr and septic shock, cardiology consulted as well.   Interim History / Subjective:  Back pain 0/10 at rest but increases to 10/10 with movement. Improved from yesterday.  No chest pain.   Objective   Blood pressure (!) 104/56, pulse 60, temperature 97.8 F (36.6 C), temperature source Oral, resp. rate 11, height '5\' 3"'$  (1.6 m), weight 84.8 kg, SpO2 96 %.    FiO2 (%):  [50 %-100 %] 50 %   Intake/Output Summary (Last 24 hours) at 06/21/2021 0835 Last data filed at 06/21/2021 0629 Gross per 24 hour  Intake 1527.12 ml  Output 1075 ml  Net 452.12 ml    Filed Weights   06/18/21 2349  Weight: 84.8 kg    Examination: General: awake but appears acutely ill. Laying reclined in bed, conversational.  HENT: NCAT, large bore nasal cannula in place. Lungs: CTAB, no wheezes  or crackles.  Cardiovascular: sinus rhythm with normal rate, no murmurs or gallops.  Abdomen: soft, mildly distended, mildly diffuse ttp.  Extremities: lower extremities cold, feet with some purple discoloration, self reported hx Raynaud Neuro: awake, able to converse, follows commands. 5/5 grip stregth. RLE 5/5 plantar and dorsi flexion. LLE 4/5 with dorsi and plantar flexion. +left straight leg raise.    Resolved Hospital Problem list   N/A  Assessment & Plan:  Septic Shock MSSA bacteremia -titrate pressors - ID following -cont cefazolin per ID -repeat blood cx -awaiting sensitivities -awaiting MRI limited 2/2 to pain. C/f discitis vs epidural abscess -F/u TTE -possible need for cvc placement but at this time tolerating peripheral norepi  Acute hypoxic resp failure: Likely due to cardiogenic pulmonary edema from afib with rvr.  -cont HHFNC -unable to diurese at this time with hypotension and worsening AKI , but can consider if further decompensates.   Afib with RVR (resolved): Now back in NSR -d/c amio and heparin infusion per cards.  AKI -follow indices and uop -has non obstructing renal stone on ct at presentation   COPD:  -cont bronchodilators  H/o HFpEF:  -echo pending -diurese when able -3/22 LVEF 60%  H/o hypertension:  -holding home meds.   Hyperlipidemia:  -statin  H/o MI with VF arrest CAD -on plavix at baseline, holding at this time in case of need for surgical intervention after imaging.   Pulmonary nodules:  -outpt f/u  Acute on chronic lumbar back pain in the setting of MSSA bacteria: Severe degenerative joint changes and canal stenosis on CT Lumbar on admission. 2 mm nonobstructive left renal calculus. -awaiting MRI r/o discitis vs epidural abscess as above -pain meds prn  Nausea:  -zofran prn  Adrenal nodules: CT on admission showed left adrenal nodules up 16 mm. -f/u MRI abd   Best Practice (right click and "Reselect all SmartList  Selections" daily)   Diet/type: NPO DVT prophylaxis: systemic heparin GI prophylaxis: PPI Lines: N/A Foley:  Yes, and it is still needed Code Status:  full code Last date of multidisciplinary goals of care discussion [8/11 with pt and father. ]  Labs   CBC: Recent Labs  Lab 06/18/21 1412 06/19/21 0322 06/20/21 0553 06/21/21 0703  WBC 24.9* 24.9* 27.4* 33.5*  NEUTROABS 21.0*  --  24.0* 27.3*  HGB 13.6 12.1 12.0 10.3*  HCT 43.7 38.6 37.3 32.3*  MCV 90.5 90.8 90.1 90.5  PLT 231 238 288 PLATELET CLUMPS NOTED ON SMEAR, UNABLE TO ESTIMATE     Basic Metabolic Panel: Recent Labs  Lab 06/18/21 1412 06/18/21 1805 06/19/21 0322 06/20/21 0553 06/21/21 0703  NA 137  --  135 135 133*  K 3.6  --  3.7 4.1 3.8  CL 106  --  104 102 101  CO2 17*  --  20* 24 21*  GLUCOSE 117*  --  139* 145* 141*  BUN 23  --  19 39* 51*  CREATININE 1.15*  --  1.03* 1.23* 2.03*  CALCIUM 8.5*  --  7.6* 7.7* 7.3*  MG  --  2.0  --  2.5*  --     GFR: Estimated Creatinine Clearance: 24 mL/min (A) (by C-G formula based on SCr of 2.03 mg/dL (H)). Recent Labs  Lab 06/18/21 1412 06/18/21 1530 06/18/21 1808 06/19/21 0322 06/20/21 0553 06/21/21 0703  PROCALCITON  --   --   --  3.28  --   --   WBC 24.9*  --   --  24.9* 27.4* 33.5*  LATICACIDVEN  --  2.0* 1.5  --   --   --      Liver Function Tests: No results for input(s): AST, ALT, ALKPHOS, BILITOT, PROT, ALBUMIN in the last 168 hours. No results for input(s): LIPASE, AMYLASE in the last 168 hours. No results for input(s): AMMONIA in the last 168 hours.  ABG    Component Value Date/Time   PHART 7.270 (L) 06/19/2021 0030   PCO2ART 45.3 06/19/2021 0030   PO2ART 89.9 06/19/2021 0030   HCO3 20.2 06/19/2021 0030   TCO2 30.1 11/03/2012 0600   ACIDBASEDEF 5.6 (H) 06/19/2021 0030   O2SAT 95.7 06/19/2021 0030      Coagulation Profile: No results for input(s): INR, PROTIME in the last 168 hours.  Cardiac Enzymes: No results for input(s):  CKTOTAL, CKMB, CKMBINDEX, TROPONINI in the last 168 hours.  HbA1C: Hgb A1c MFr Bld  Date/Time Value Ref Range Status  10/29/2012 01:30 PM 5.8 (H) <5.7 % Final    Comment:    (NOTE)                                                                       According to the ADA Clinical  Practice Recommendations for 2011, when HbA1c is used as a screening test:  >=6.5%   Diagnostic of Diabetes Mellitus           (if abnormal result is confirmed) 5.7-6.4%   Increased risk of developing Diabetes Mellitus References:Diagnosis and Classification of Diabetes Mellitus,Diabetes [id number] 1):S62-S69 and Standards of Medical Care in         Diabetes - 2011,Diabetes [id number] (Suppl 1):S11-S61.    CBG: Recent Labs  Lab 06/20/21 1153 06/20/21 1541  GLUCAP 117* 142*     Review of Systems:   Denies chest pain. No vomiting. +back pain with movement  Past Medical History:  She,  has a past medical history of CAD (coronary artery disease), Cardiac arrest ([place]) (10/2012), GERD (gastroesophageal reflux disease), H1N1 influenza (10/2012), echocardiogram, Hyperlipidemia, Hypertension, Myocardial infarction [facility]), Pneumonia (10/2012), PUD (peptic ulcer disease), Syncope (1996), and Tobacco abuse.   Surgical History:   Past Surgical History:  Procedure Laterality Date   BREAST EXCISIONAL BIOPSY     left breast ? 1981   CORONARY ANGIOPLASTY WITH STENT PLACEMENT     s/p inferior STEMI c/b VF arrest and cardiogenic shock requiring IABP   ESOPHAGOGASTRODUODENOSCOPY (EGD) WITH PROPOFOL N/A 03/10/2020   Procedure: ESOPHAGOGASTRODUODENOSCOPY (EGD) WITH PROPOFOL;  Surgeon: Lavena Bullion, DO;  Location: [facility];  Service: Gastroenterology;  Laterality: N/A;   HEMOSTASIS CLIP PLACEMENT  03/10/2020   Procedure: HEMOSTASIS CLIP PLACEMENT;  Surgeon: Lavena Bullion, DO;  Location: [facility] [facility];  Service: Gastroenterology;;   HEMOSTASIS CONTROL  03/10/2020   Procedure: HEMOSTASIS CONTROL;   Surgeon: Lavena Bullion, DO;  Location: [place] [facility];  Service: Gastroenterology;;  epi   INTRA-AORTIC BALLOON PUMP INSERTION  10/29/2012   Procedure: INTRA-AORTIC BALLOON PUMP INSERTION;  Surgeon: Burnell Blanks, MD;  Location: [facility] CATH LAB;  Service: Cardiovascular;;   LEFT HEART CATHETERIZATION WITH CORONARY ANGIOGRAM N/A 10/29/2012   Procedure: LEFT HEART CATHETERIZATION WITH CORONARY ANGIOGRAM;  Surgeon: Burnell Blanks, MD;  Location: [facility] CATH LAB;  Service: Cardiovascular;  Laterality: N/A;   PERCUTANEOUS CORONARY STENT INTERVENTION (PCI-S)  10/29/2012   Procedure: PERCUTANEOUS CORONARY STENT INTERVENTION (PCI-S);  Surgeon: Burnell Blanks, MD;  Location: [facility] CATH LAB;  Service: Cardiovascular;;   TUBAL LIGATION       Social History:   reports that she quit smoking about 8 years ago. Her smoking use included cigarettes. She has a 20.00 pack-year smoking history. She has never used smokeless tobacco. She reports that she does not drink alcohol and does not use drugs.   Family History:  Her family history includes Heart attack in her brother; Stroke in her maternal grandmother. There is no history of Colon cancer, Pancreatic cancer, Prostate cancer, Rectal cancer, or Stomach cancer.   Allergies Allergies  Allergen Reactions   Shellfish Allergy Nausea And Vomiting   Codeine Other (See Comments)    Makes me hyperactive and not able to sleep   Lidocaine Palpitations     Home Medications  Prior to Admission medications   Medication Sig Start Date End Date Taking? Authorizing Provider  acetaminophen (TYLENOL) 325 MG tablet Take 650 mg by mouth every 6 (six) hours as needed for mild pain or headache. For headache or pain   Yes [provider]  atorvastatin (LIPITOR) 80 MG tablet Take 1 tablet (80 mg total) by mouth daily at 6 PM. 11/15/12  Yes Love, Ivan Anchors, PA-C  clopidogrel (PLAVIX) 75 MG tablet Take 1 tablet (75 mg total) by mouth daily. 03/17/20  Yes  Ghimire, Henreitta Leber, MD  escitalopram (LEXAPRO) 5 MG tablet Take 5 mg by mouth daily.   Yes [provider]  furosemide (LASIX) 40 MG tablet Take 40 mg by mouth daily. 07/23/20  Yes [provider]  Javier Docker Oil 500 MG CAPS Take 500 mg by mouth daily.   Yes [provider]  lisinopril (ZESTRIL) 5 MG tablet Take 5 mg by mouth daily. 07/19/20  Yes [provider]  metoprolol tartrate (LOPRESSOR) 25 MG tablet Take 1 tablet (25 mg total) by mouth 2 (two) times daily. 11/15/12  Yes Love, Ivan Anchors, PA-C  nitroGLYCERIN (NITROSTAT) 0.4 MG SL tablet DISSOLVE ONE TABLET UNDER THE TONGUE EVERY FIVE MINUTES AS NEEDED FOR CHEST PAIN. DO NOT EXCEED A TOTAL OF THREE DOSES IN 15 MINUTES Patient taking differently: Place 0.4 mg under the tongue every 5 (five) minutes as needed for chest pain. 09/03/20  Yes Burnell Blanks, MD  pantoprazole (PROTONIX) 40 MG tablet Take 1 tablet (40 mg total) by mouth daily. 12/10/20 06/18/21 Yes Burnell Blanks, MD  PROAIR RESPICLICK [phone] Base) MCG/ACT AEPB Take 2 puffs by mouth every 6 (six) hours as needed for wheezing or shortness of breath. 12/27/19  Yes [provider]  Probiotic Product (PROBIOTIC PO) Take 1 capsule by mouth daily.   Yes [provider]  psyllium (REGULOID) 0.52 G capsule Take 0.52 g by mouth daily.   Yes [provider]  SYMBICORT 160-4.5 MCG/ACT inhaler Inhale 2 puffs into the lungs daily. 01/13/20  Yes [provider]     Critical care time: 34 min

## 2021-06-21 NOTE — Progress Notes (Signed)
PHARMACY NOTE:  ANTIMICROBIAL RENAL DOSAGE ADJUSTMENT  Current antimicrobial regimen includes a mismatch between antimicrobial dosage and estimated renal function.  As per policy approved by the Pharmacy & Therapeutics and Medical Executive Committees, the antimicrobial dosage will be adjusted accordingly.  Current antimicrobial dosage:  Cefazolin 2 gm IV Q 8 hours   Indication: MSSA bacteremia  Renal Function:  Estimated Creatinine Clearance: 24 mL/min (A) (by C-G formula based on SCr of 2.03 mg/dL (H)). '[]'$      On intermittent HD, scheduled: '[]'$      On CRRT    Antimicrobial dosage has been changed to:  Cefazolin 2 gm IV Q 12 hours   Additional comments:   Thank you for allowing pharmacy to be a part of this patient's care.  Jimmy Footman, PharmD, BCPS, BCIDP Infectious Diseases Clinical Pharmacist Phone: [phone] 06/21/2021 10:07 AM

## 2021-06-21 NOTE — Progress Notes (Signed)
Bilateral lower extremity venous duplex has been completed. Preliminary results can be found in CV Proc through chart review.   06/21/21 1:31 PM Carlos Levering RVT

## 2021-06-21 NOTE — Progress Notes (Signed)
[place] Progress Note Patient Name: DESHAUNDA HURRLE DOB: 1946/10/06 MRN: [id number]   Date of Service  06/21/2021  HPI/Events of Note  Follow-up MRI result MRI abd - benign left adrenal adenomas.  LLL pna and small effusions MRI TL spine without evidence of infection but significant chronic spinal changes.  eICU Interventions  No acute intervention needed        Mauri Brooklyn, Mamie Nick 06/21/2021, 10:46 PM

## 2021-06-21 NOTE — Progress Notes (Signed)
ANTICOAGULATION CONSULT NOTE - Follow Up Consult  Pharmacy Consult for heparin Indication: atrial fibrillation  Allergies  Allergen Reactions   Shellfish Allergy Nausea And Vomiting   Codeine Other (See Comments)    Makes me hyperactive and not able to sleep   Lidocaine Palpitations    Patient Measurements: Height: '5\' 3"'$  (160 cm) Weight: 84.8 kg (186 lb 15.2 oz) IBW/kg (Calculated) : 52.4 Heparin Dosing Weight: 70kg  Vital Signs: Temp: 96.7 F (35.9 C) (08/11 2315) Temp Source: Axillary (08/11 2315) BP: 88/57 (08/12 0000) Pulse Rate: 60 (08/12 0000)  Labs: Recent Labs    06/18/21 1412 06/18/21 1530 06/19/21 0322 06/20/21 0553 06/21/21 0242  HGB 13.6  --  12.1 12.0  --   HCT 43.7  --  38.6 37.3  --   PLT 231  --  238 288  --   HEPARINUNFRC  --   --   --   --  0.67  CREATININE 1.15*  --  1.03* 1.23*  --   TROPONINIHS 47* 49*  --   --   --      Estimated Creatinine Clearance: 39.5 mL/min (A) (by C-G formula based on SCr of 1.23 mg/dL (H)).   Assessment: 75 year old female with newly diagnosed afib with RVR this afternoon. New orders to start IV heparin and hold off on oral anticoagulation until all procedures are completed. Will go ahead and check benefits for apixaban. It does not appear that she was on anticoagulation prior to admit. CBC has been stable this admit and she was not order pharmacological prophylaxis.   8/12 AM update:  Heparin level therapeutic   Goal of Therapy:  Heparin level 0.3-0.7 units/ml Monitor platelets by anticoagulation protocol: Yes   Plan:  Cont heparin at 1000 units/hr 1200 heparin level  Narda Bonds, PharmD, [facility] Pharmacist Phone: [phone]

## 2021-06-21 NOTE — Progress Notes (Signed)
    Pt converted back to sinus yesterday  She has improved significantly This likely was due to severe sepsis syndrome  DC Amio No need for heparin from my [place]  Cardiology will sign off.  Call for questions.    Mertie Moores, MD  06/21/2021 10:07 AM    [address],  [place], [place]  [address] Phone: [phone]; Fax: [phone]

## 2021-06-21 NOTE — Progress Notes (Addendum)
[facility] for Infectious Disease  I have seen and examined the patient. I have personally reviewed the clinical findings, laboratory findings, microbiological data and imaging studies. The assessment and treatment plan was discussed with the  Advance Practice Provider, Janene Madeira.  I agree with her/his recommendations except following additions/corrections.  In MICU for hypotension/Pul edema and Afib w RVR Started on Vasopressors  Afebrile, WBC went up to 33.5  Back pain is better No peripheral joint pain and swelling on exam   Per lab, Strep gallolyticus was a lab error and not a true positive ( The strep gallolyticus was from blood cultures from another patient my partner Dr Gale Journey is currently following)  Continue cefazolin as is Fu repeat blood cultures  If TTE is negative, she will need TEE to r/o endocarditis when feasible especially given her acute decompensation with pul edema and A fib Monitor for metastatic site of infection with staph aureus. Fu MRI abdomen and TL spine when able Monitor CBC and BMP  Dr Juleen China available as needed over the weekend. Otherwise, new ID team will follow on Monday.   [name], MD [place] for Infectious Disease [facility]     Date of Admission:  06/18/2021      Total days of antibiotics 3   Cefazolin 8/10 >> current          ASSESSMENT: Samantha King is a 75 y.o. female with severe sudden onset low back pain in the setting of MSSA bacteremia. Moved to ICU for shock presentation in the setting of new AFib. She is currently on levophed, HR back in NSR with rate in the 60s on tele. Cardiology / PCCM managing. Plan for MRI next week. No neurologic deterioration reported by her husband. Will follow TTE results (today) and repeated blood cultures. Strep gallolyticus was a reporting error from lab and not part of current infection. Continue treatment with cefazolin, unlikely any other pathogen/process  contributing and acute decline more related to acute afib / edema.  Hold on PICC for now as hemodynamics permit.   Acute on Chronic Kidney Disease: Lab Results  Component Value Date   CREATININE 2.03 (H) 06/21/2021   CREATININE 1.23 (H) 06/20/2021   CREATININE 1.03 (H) 06/19/2021  Worsening in the setting of shock/hypotension. Continues on levophed for support. Change cefazolin to q12h and follow trend.   Respiratory failure: better on less oxygen today. Exacerbated by AFib + IVF received during sepsis syndrome. PCCM managing timing of diuresis.   Severe pain in the setting of back infection - much more comfortable today.   Dr. Juleen China is available over the weekend for any concerns or changes in patient's condition. Otherwise will see her back on Monday. This was communicated to her husband today.    PLAN: Continue cefazolin Follow MRI results  Follow repeat blood cultures Defer central line / PICC for now as hemodynamics permit    Principal Problem:   MSSA bacteremia Active Problems:   Back pain   Sepsis ([place])   COPD with acute exacerbation (HCC)   Acute hypoxemic respiratory failure (HCC)   Pulmonary nodules   Paroxysmal atrial fibrillation (HCC)   Hypoxemia    acetaminophen  650 mg Oral QID   atorvastatin  80 mg Oral q1800   budesonide (PULMICORT) nebulizer solution  0.25 mg Nebulization BID   Chlorhexidine Gluconate Cloth  6 each Topical Daily   cyclobenzaprine  10 mg Oral TID   escitalopram  5 mg Oral  Daily   pantoprazole  40 mg Oral Daily    SUBJECTIVE: Samantha King is resting / sleeping quietly in bed. Her husband is at the bedside. He is thankful her pain is under better control.    Review of Systems: Review of Systems  Unable to perform ROS: Other  Patient is sleeping   Allergies  Allergen Reactions   Shellfish Allergy Nausea And Vomiting   Codeine Other (See Comments)    Makes me hyperactive and not able to sleep   Lidocaine Palpitations     OBJECTIVE: Vitals:   06/21/21 0845 06/21/21 0900 06/21/21 0915 06/21/21 1000  BP: 132/83 130/69 122/78 (!) 87/53  Pulse: 71 71 67 (!) 57  Resp: '19 17 17 11  '$ Temp:      TempSrc:      SpO2: 92% 95% 94% 95%  Weight:      Height:       Body mass index is 33.12 kg/m.  Physical Exam Vitals reviewed.  Constitutional:      Appearance: Normal appearance. She is not ill-appearing.     Comments: Sleeping quietly in bed.   HENT:     Mouth/Throat:     Mouth: Mucous membranes are moist.     Pharynx: Oropharynx is clear.  Eyes:     General: No scleral icterus. Cardiovascular:     Rate and Rhythm: Normal rate. Rhythm irregular.  Pulmonary:     Effort: Pulmonary effort is normal.     Breath sounds: No wheezing.     Comments: > 2 LPM [place] now with Pox > 94%  Neurological:     Mental Status: She is oriented to person, place, and time.  Psychiatric:        Mood and Affect: Mood normal.        Thought Content: Thought content normal.    Lab Results Lab Results  Component Value Date   WBC 33.5 (H) 06/21/2021   HGB 10.3 (L) 06/21/2021   HCT 32.3 (L) 06/21/2021   MCV 90.5 06/21/2021   PLT PLATELET CLUMPS NOTED ON SMEAR, UNABLE TO ESTIMATE 06/21/2021    Lab Results  Component Value Date   CREATININE 2.03 (H) 06/21/2021   BUN 51 (H) 06/21/2021   NA 133 (L) 06/21/2021   K 3.8 06/21/2021   CL 101 06/21/2021   CO2 21 (L) 06/21/2021    Lab Results  Component Value Date   ALT 27 03/10/2020   AST 24 03/10/2020   ALKPHOS 51 03/10/2020   BILITOT 0.9 03/10/2020     Microbiology: Recent Results (from the past 240 hour(s))  Blood culture (routine x 2)     Status: Abnormal   Collection Time: 06/18/21  3:30 PM   Specimen: BLOOD  Result Value Ref Range Status   Specimen Description BLOOD LEFT ANTECUBITAL  Final   Special Requests   Final    BOTTLES DRAWN AEROBIC AND ANAEROBIC Blood Culture adequate volume   Culture  Setup Time   Final    GRAM POSITIVE COCCI IN BOTH AEROBIC AND  ANAEROBIC BOTTLES CRITICAL RESULT CALLED TO, READ BACK BY AND VERIFIED WITH: A MEYER,PHARMD'@0708'$  06/19/21 [place] Performed at [facility], [place] [address], [place], [place] [address]    Culture STAPHYLOCOCCUS AUREUS (A)  Final   Report Status 06/21/2021 FINAL  Final   Organism ID, Bacteria STAPHYLOCOCCUS AUREUS  Final      Susceptibility   Staphylococcus aureus - MIC*    CIPROFLOXACIN <=0.5 SENSITIVE Sensitive  ERYTHROMYCIN <=0.25 SENSITIVE Sensitive     GENTAMICIN <=0.5 SENSITIVE Sensitive     OXACILLIN <=0.25 SENSITIVE Sensitive     TETRACYCLINE <=1 SENSITIVE Sensitive     VANCOMYCIN <=0.5 SENSITIVE Sensitive     TRIMETH/SULFA <=10 SENSITIVE Sensitive     CLINDAMYCIN <=0.25 SENSITIVE Sensitive     RIFAMPIN <=0.5 SENSITIVE Sensitive     Inducible Clindamycin NEGATIVE Sensitive     * STAPHYLOCOCCUS AUREUS  Blood culture (routine x 2)     Status: None   Collection Time: 06/18/21  3:30 PM   Specimen: BLOOD  Result Value Ref Range Status   Specimen Description BLOOD SITE NOT SPECIFIED  Final   Special Requests   Final    BOTTLES DRAWN AEROBIC AND ANAEROBIC Blood Culture results may not be optimal due to an inadequate volume of blood received in culture bottles   Culture  Setup Time   Final    GRAM POSITIVE COCCI IN BOTH AEROBIC AND ANAEROBIC BOTTLES CRITICAL VALUE NOTED.  VALUE IS CONSISTENT WITH PREVIOUSLY REPORTED AND CALLED VALUE.    Culture   Final    CORRECTED RESULTS STAPHYLOCOCCUS AUREUS PREVIOUSLY REPORTED AS: STAPHYLOCOCCUS AUREUS STREPTOCOCCUS GALLOLYTICUS CORRECTED RESULTS CALLED TO: PHARMD CATHY P. [id number] FCP SUSCEPTIBILITIES PERFORMED ON PREVIOUS CULTURE WITHIN THE LAST 5 DAYS. Performed at [facility], [place] [address], [place], [place] [address]    Report Status 06/21/2021 FINAL  Final  Blood Culture ID Panel (Reflexed)     Status: Abnormal   Collection Time: 06/18/21  3:30 PM  Result Value Ref Range Status   Enterococcus faecalis NOT DETECTED  NOT DETECTED Final   Enterococcus Faecium NOT DETECTED NOT DETECTED Final   Listeria monocytogenes NOT DETECTED NOT DETECTED Final   Staphylococcus species DETECTED (A) NOT DETECTED Final    Comment: CRITICAL RESULT CALLED TO, READ BACK BY AND VERIFIED WITH: A MEYER,PHARMD'@0712'$  06/19/21 [place]    Staphylococcus aureus (BCID) DETECTED (A) NOT DETECTED Final    Comment: CRITICAL RESULT CALLED TO, READ BACK BY AND VERIFIED WITH: A MEYER,PHARMD'@0712'$  06/19/21 [place]    Staphylococcus epidermidis NOT DETECTED NOT DETECTED Final   Staphylococcus lugdunensis NOT DETECTED NOT DETECTED Final   Streptococcus species NOT DETECTED NOT DETECTED Final   Streptococcus agalactiae NOT DETECTED NOT DETECTED Final   Streptococcus pneumoniae NOT DETECTED NOT DETECTED Final   Streptococcus pyogenes NOT DETECTED NOT DETECTED Final   A.calcoaceticus-baumannii NOT DETECTED NOT DETECTED Final   Bacteroides fragilis NOT DETECTED NOT DETECTED Final   Enterobacterales NOT DETECTED NOT DETECTED Final   Enterobacter cloacae complex NOT DETECTED NOT DETECTED Final   Escherichia coli NOT DETECTED NOT DETECTED Final   Klebsiella aerogenes NOT DETECTED NOT DETECTED Final   Klebsiella oxytoca NOT DETECTED NOT DETECTED Final   Klebsiella pneumoniae NOT DETECTED NOT DETECTED Final   Proteus species NOT DETECTED NOT DETECTED Final   Salmonella species NOT DETECTED NOT DETECTED Final   Serratia marcescens NOT DETECTED NOT DETECTED Final   Haemophilus influenzae NOT DETECTED NOT DETECTED Final   Neisseria meningitidis NOT DETECTED NOT DETECTED Final   Pseudomonas aeruginosa NOT DETECTED NOT DETECTED Final   Stenotrophomonas maltophilia NOT DETECTED NOT DETECTED Final   Candida albicans NOT DETECTED NOT DETECTED Final   Candida auris NOT DETECTED NOT DETECTED Final   Candida glabrata NOT DETECTED NOT DETECTED Final   Candida krusei NOT DETECTED NOT DETECTED Final   Candida parapsilosis NOT DETECTED NOT DETECTED Final    Candida tropicalis NOT DETECTED NOT DETECTED Final  Cryptococcus neoformans/gattii NOT DETECTED NOT DETECTED Final   Meth resistant mecA/C and MREJ NOT DETECTED NOT DETECTED Final    Comment: Performed at [facility], [place] [address] [place], [place] [address]  Resp Panel by RT-PCR (Flu A&B, Covid) Nasopharyngeal Swab     Status: None   Collection Time: 06/18/21  4:59 PM   Specimen: Nasopharyngeal Swab; Nasopharyngeal(NP) swabs in vial transport medium  Result Value Ref Range Status   SARS Coronavirus 2 by RT PCR NEGATIVE NEGATIVE Final    Comment: (NOTE) SARS-CoV-2 target nucleic acids are NOT DETECTED.  The SARS-CoV-2 RNA is generally detectable in upper respiratory specimens during the acute phase of infection. The lowest concentration of SARS-CoV-2 viral copies this assay can detect is 138 copies/mL. A negative result does not preclude SARS-Cov-2 infection and should not be used as the sole basis for treatment or other patient management decisions. A negative result may occur with  improper specimen collection/handling, submission of specimen other than nasopharyngeal swab, presence of viral mutation(s) within the areas targeted by this assay, and inadequate number of viral copies(<138 copies/mL). A negative result must be combined with clinical observations, patient history, and epidemiological information. The expected result is Negative.  Fact Sheet for Patients:  EntrepreneurPulse.com.au  Fact Sheet for Healthcare Providers:  IncredibleEmployment.be  This test is no t yet approved or cleared by the [place] FDA and  has been authorized for detection and/or diagnosis of SARS-CoV-2 by FDA under an Emergency Use Authorization (EUA). This EUA will remain  in effect (meaning this test can be used) for the duration of the COVID-19 declaration under Section 564(b)(1) of the Act, 21 U.S.C.section 360bbb-3(b)(1), unless the  authorization is terminated  or revoked sooner.       Influenza A by PCR NEGATIVE NEGATIVE Final   Influenza B by PCR NEGATIVE NEGATIVE Final    Comment: (NOTE) The Xpert Xpress SARS-CoV-2/FLU/RSV plus assay is intended as an aid in the diagnosis of influenza from Nasopharyngeal swab specimens and should not be used as a sole basis for treatment. Nasal washings and aspirates are unacceptable for Xpert Xpress SARS-CoV-2/FLU/RSV testing.  Fact Sheet for Patients: EntrepreneurPulse.com.au  Fact Sheet for Healthcare Providers: IncredibleEmployment.be  This test is not yet approved or cleared by the [place] FDA and has been authorized for detection and/or diagnosis of SARS-CoV-2 by FDA under an Emergency Use Authorization (EUA). This EUA will remain in effect (meaning this test can be used) for the duration of the COVID-19 declaration under Section 564(b)(1) of the Act, 21 U.S.C. section 360bbb-3(b)(1), unless the authorization is terminated or revoked.  Performed at [facility], [place] [address], [place], [place] [address]   Urine Culture     Status: Abnormal   Collection Time: 06/18/21 11:50 PM   Specimen: Urine, Clean Catch  Result Value Ref Range Status   Specimen Description URINE, CLEAN CATCH  Final   Special Requests NONE  Final   Culture (A)  Final    <10,000 COLONIES/mL INSIGNIFICANT GROWTH Performed at [facility], [address] [place], [place] [address]    Report Status 06/20/2021 FINAL  Final  Culture, blood (routine x 2)     Status: None (Preliminary result)   Collection Time: 06/20/21  5:53 AM   Specimen: BLOOD  Result Value Ref Range Status   Specimen Description BLOOD RIGHT ANTECUBITAL  Final   Special Requests   Final    BOTTLES DRAWN AEROBIC AND ANAEROBIC Blood Culture adequate  volume   Culture   Final    NO GROWTH <12 HOURS Performed at [facility], [place] [address] [place], [place]  [address]    Report Status PENDING  Incomplete    Janene Madeira, MSN, NP-C [facility]ious [place] Cell: [phone] Pager: [phone]  06/21/2021  10:47 AM

## 2021-06-21 NOTE — Progress Notes (Signed)
OT Cancellation Note  Patient Details Name: Samantha King MRN: [id number] DOB: 1946/07/27   Cancelled Treatment:    Reason Eval/Treat Not Completed: Medical issues which prohibited therapy. Pt transferred to ICU due to afib with rvr, hypotension req pressors and hypoxemia req hhfnc.   [place], OTR/L Acute Rehab Services Pager [phone] Office [phone]    Almon Register 06/21/2021, 9:32 AM

## 2021-06-22 ENCOUNTER — Inpatient Hospital Stay (HOSPITAL_COMMUNITY): Payer: Medicare Other

## 2021-06-22 DIAGNOSIS — B9561 Methicillin susceptible Staphylococcus aureus infection as the cause of diseases classified elsewhere: Secondary | ICD-10-CM

## 2021-06-22 DIAGNOSIS — A419 Sepsis, unspecified organism: Secondary | ICD-10-CM

## 2021-06-22 DIAGNOSIS — R7881 Bacteremia: Secondary | ICD-10-CM | POA: Diagnosis not present

## 2021-06-22 DIAGNOSIS — J9601 Acute respiratory failure with hypoxia: Secondary | ICD-10-CM | POA: Diagnosis not present

## 2021-06-22 LAB — CBC
HCT: 39.4 % (ref 36.0–46.0)
Hemoglobin: 12.6 g/dL (ref 12.0–15.0)
MCH: 28 pg (ref 26.0–34.0)
MCHC: 32 g/dL (ref 30.0–36.0)
MCV: 87.6 fL (ref 80.0–100.0)
Platelets: 277 10*3/uL (ref 150–400)
RBC: 4.5 MIL/uL (ref 3.87–5.11)
RDW: 18.1 % — ABNORMAL HIGH (ref 11.5–15.5)
WBC: 24.9 10*3/uL — ABNORMAL HIGH (ref 4.0–10.5)
nRBC: 0.1 % (ref 0.0–0.2)

## 2021-06-22 LAB — BASIC METABOLIC PANEL
Anion gap: 10 (ref 5–15)
Anion gap: 9 (ref 5–15)
BUN: 35 mg/dL — ABNORMAL HIGH (ref 8–23)
BUN: 36 mg/dL — ABNORMAL HIGH (ref 8–23)
CO2: 23 mmol/L (ref 22–32)
CO2: 21 mmol/L — ABNORMAL LOW (ref 22–32)
Calcium: 7.9 mg/dL — ABNORMAL LOW (ref 8.9–10.3)
Calcium: 8 mg/dL — ABNORMAL LOW (ref 8.9–10.3)
Chloride: 103 mmol/L (ref 98–111)
Chloride: 105 mmol/L (ref 98–111)
Creatinine, Ser: 1.15 mg/dL — ABNORMAL HIGH (ref 0.44–1.00)
Creatinine, Ser: 1.02 mg/dL — ABNORMAL HIGH (ref 0.44–1.00)
GFR, Estimated: 49 mL/min — ABNORMAL LOW (ref >60)
GFR, Estimated: 57 mL/min — ABNORMAL LOW (ref >60)
Glucose, Bld: 138 mg/dL — ABNORMAL HIGH (ref 70–99)
Glucose, Bld: 119 mg/dL — ABNORMAL HIGH (ref 70–99)
Potassium: 3.7 mmol/L (ref 3.5–5.1)
Potassium: 3.5 mmol/L (ref 3.5–5.1)
Sodium: 136 mmol/L (ref 135–145)
Sodium: 135 mmol/L (ref 135–145)

## 2021-06-22 LAB — PATHOLOGIST SMEAR REVIEW

## 2021-06-22 LAB — MAGNESIUM: Magnesium: 2.6 mg/dL — ABNORMAL HIGH (ref 1.7–2.4)

## 2021-06-22 LAB — GLUCOSE, CAPILLARY: Glucose-Capillary: 77 mg/dL (ref 70–99)

## 2021-06-22 IMAGING — DX DG CHEST 1V PORT
1 series · 1 of 1 positions shown · non-contrast
Comparison: [DATE]

CLINICAL DATA: Back pain. Evaluate pulmonary edema, pleural
effusion, pneumothorax.

EXAM:
PORTABLE CHEST 1 VIEW

[chest ap]
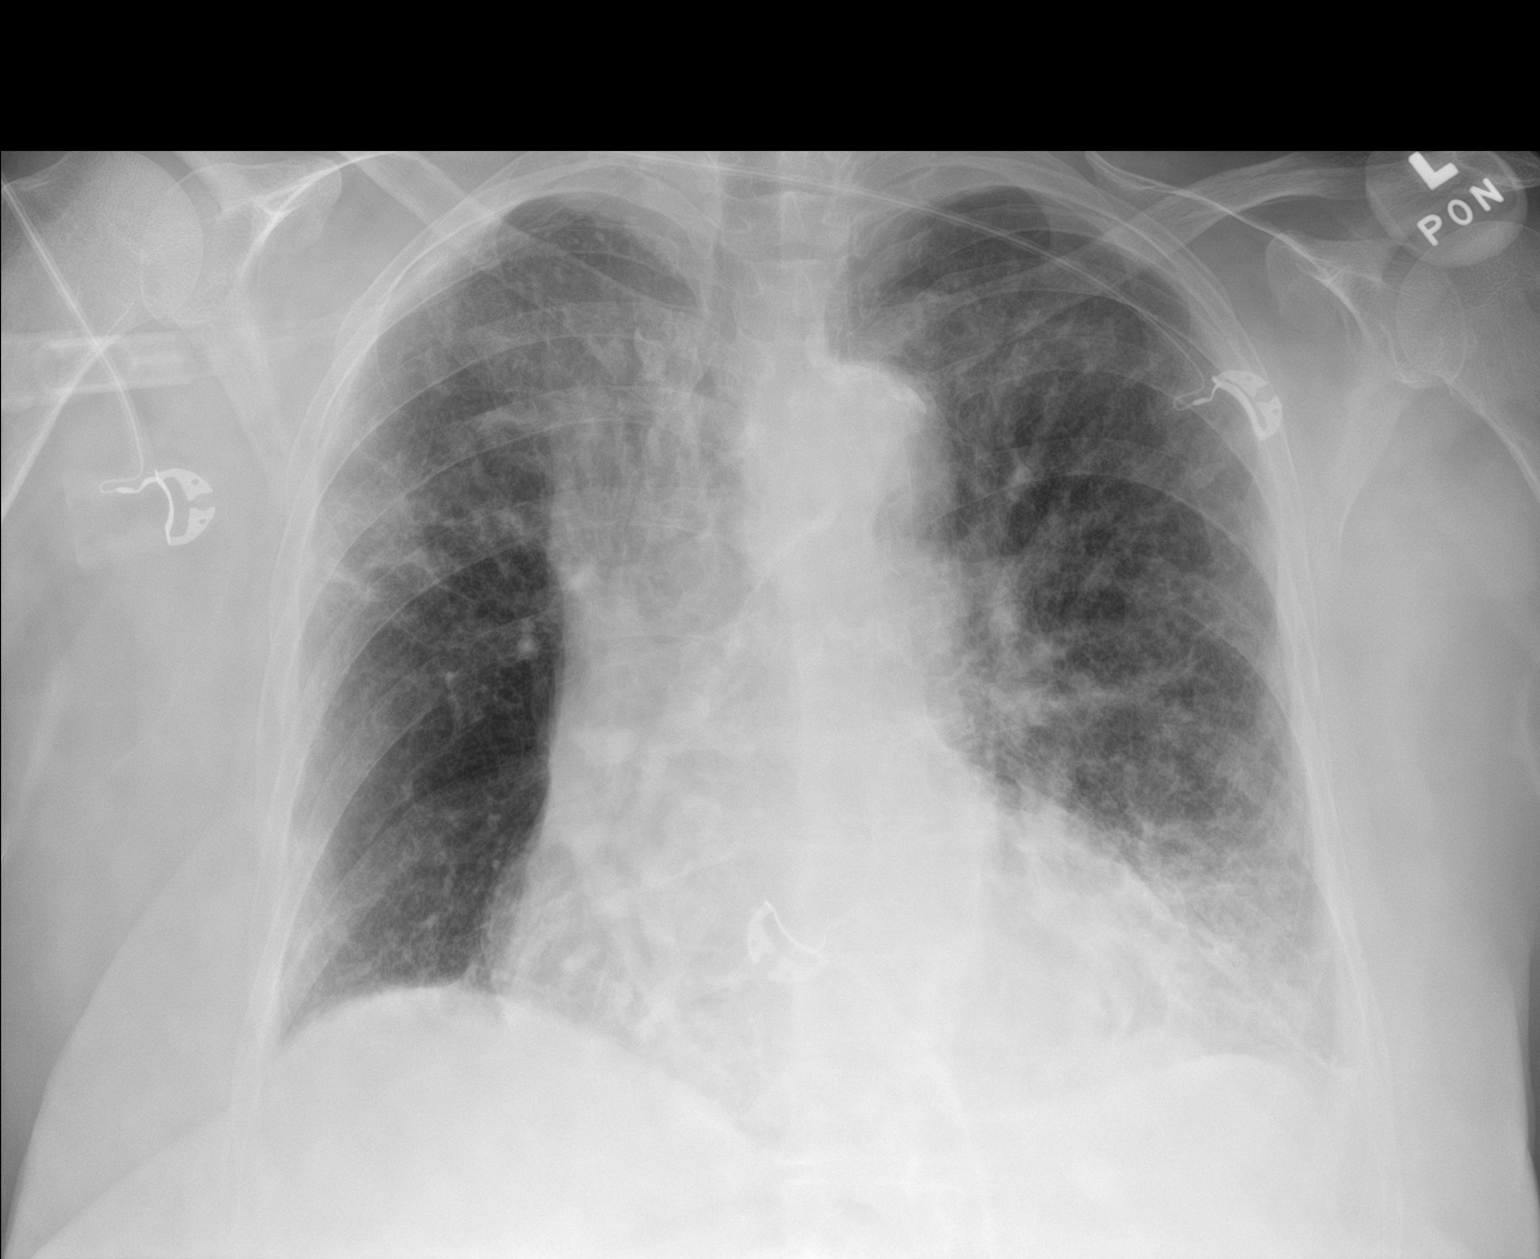

[1 of 1 positions shown; findings below may reference images not displayed]

FINDINGS: The cardiomediastinal silhouette is stable. Stable cardiomegaly. No
pneumothorax. Patchy bilateral pulmonary infiltrates are stable. No
other interval changes.
IMPRESSION: Patchy bilateral pulmonary infiltrates are identified. The patchy
nature of the opacities suggest pneumonia.

## 2021-06-22 MED ORDER — BUDESONIDE 0.5 MG/2ML IN SUSP
0.5000 mg | Freq: Two times a day (BID) | RESPIRATORY_TRACT | Status: DC
  Administered 2021-06-23 – 2021-06-25 (×4): 0.5 mg via RESPIRATORY_TRACT
  Filled 2021-06-22 (×4): qty 2

## 2021-06-22 MED ORDER — ACETAMINOPHEN 325 MG PO TABS
650.0000 mg | ORAL_TABLET | ORAL | Status: DC
  Administered 2021-06-22 – 2021-06-25 (×16): 650 mg via ORAL
  Filled 2021-06-22 (×16): qty 2

## 2021-06-22 MED ORDER — DICLOFENAC SODIUM 1 % EX GEL
4.0000 g | Freq: Four times a day (QID) | CUTANEOUS | Status: DC
  Administered 2021-06-22 – 2021-06-25 (×10): 4 g via TOPICAL
  Filled 2021-06-22: qty 100

## 2021-06-22 MED ORDER — METOPROLOL TARTRATE 5 MG/5ML IV SOLN
2.5000 mg | INTRAVENOUS | Status: DC | PRN
Start: 2021-06-22 — End: 2021-06-25
  Administered 2021-06-22: 5 mg via INTRAVENOUS

## 2021-06-22 MED ORDER — FENTANYL CITRATE (PF) 100 MCG/2ML IJ SOLN
12.5000 ug | INTRAMUSCULAR | Status: DC | PRN
  Administered 2021-06-22 – 2021-06-23 (×2): 12.5 ug via INTRAVENOUS
  Filled 2021-06-22 (×2): qty 2

## 2021-06-22 MED ORDER — DILTIAZEM HCL-DEXTROSE 125-5 MG/125ML-% IV SOLN (PREMIX)
5.0000 mg/h | INTRAVENOUS | Status: DC
  Administered 2021-06-22: 5 mg/h via INTRAVENOUS
  Filled 2021-06-22: qty 125

## 2021-06-22 MED ORDER — METOPROLOL TARTRATE 5 MG/5ML IV SOLN
INTRAVENOUS | Status: AC
  Filled 2021-06-22: qty 5

## 2021-06-22 MED ORDER — MORPHINE SULFATE (PF) 2 MG/ML IV SOLN: 1.0000 mg | INTRAVENOUS | Status: DC | PRN

## 2021-06-22 MED ORDER — GABAPENTIN 100 MG PO CAPS
100.0000 mg | ORAL_CAPSULE | Freq: Three times a day (TID) | ORAL | Status: DC
  Administered 2021-06-22 – 2021-06-24 (×7): 100 mg via ORAL
  Filled 2021-06-22 (×7): qty 1

## 2021-06-22 NOTE — Progress Notes (Signed)
  Back in AF-RVR BP is now improved, remains off levo  Lopressor '5mg'$  x1 If HR remains high, start cardizem gtt for rate control  Jaidalyn Schillo V. Elsworth Soho MD

## 2021-06-22 NOTE — Progress Notes (Signed)
Patient turned in bed and went back into afib. Dr Elsworth Soho paged and orders received. VS as recorded.  Spouse and two sons at bedside and requesting patients wedding band be removed due to swelling. Band taken off and given to husband. Spouse left with wedding band in his possession.

## 2021-06-22 NOTE — Progress Notes (Signed)
NAME:  Samantha King, MRN:  [id number], DOB:  September 05, 1946, LOS: 4 ADMISSION DATE:  06/18/2021, CONSULTATION DATE:  06/20/21 REFERRING MD:  TRH, CHIEF COMPLAINT:  afib rvr and shock   History of Present Illness:  75 yo female who was in normal state of health presented 8/11 with severe back pain,  Pt was admitted found to have mssa bacteremia. CT chest showed changes of emphysema and vague nodular opacities.  CT abd/pelvis showed multiple Lt adrenal nodules.  She was started on appropriate therapy and was awaiting imaging of back. Unfortunately on 8/11 pt decompensated and req transfer to ICU 2/2 afib with rvr, hypotension on cardizem req pressors and hypoxemia req hhfnc.  Pertinent  Medical History  CAD  H/o vf arrest Hyperlipidemia Htn Hfpef COPD  Significant Hospital Events: Including procedures, antibiotic start and stop dates in addition to other pertinent events   8/10: ID consulted 2/2 MSSA bacteremia 8/11: transferred to ICU for afib rvr and septic shock, cardiology consulted as well.  Back pain 0/10 at rest but increases to 10/10 with movement.  8/12 MRI abd - benign left adrenal adenomas.  LLL pna and small effusions MRI TL spine without evidence of infection but significant chronic spinal changes.   Interim History / Subjective:  Complains of excruciating pain, Converted to sinus rhythm, amiodarone and heparin discontinued Remains on high flow nasal cannula 20 L / 40%  Objective   Blood pressure 133/65, pulse 85, temperature 98 F (36.7 C), temperature source Oral, resp. rate 13, height '5\' 3"'$  (1.6 m), weight 84.8 kg, SpO2 95 %.    FiO2 (%):  [40 %-50 %] 40 %   Intake/Output Summary (Last 24 hours) at 06/22/2021 0824 Last data filed at 06/22/2021 0528 Gross per 24 hour  Intake 985.74 ml  Output 1230 ml  Net -244.26 ml    Filed Weights   06/18/21 2349  Weight: 84.8 kg    Examination: General: acutely ill.  Supine in bed, just received fentanyl HENT: NCAT,  high flow nasal cannula in place.  No JVD, pallor Lungs: CTAB, no wheezes or crackles.  No accessory muscle use Cardiovascular: Regular rate and rhythm, no murmurs Abdomen: soft, mildly distended, mildly diffuse ttp.  Extremities: lower extremities cold, feet with some purple discoloration, self reported hx Raynaud Neuro: awake, able to converse, follows commands.  On 8/12 -5/5 grip stregth. RLE 5/5 plantar and dorsi flexion. LLE 4/5 with dorsi and plantar flexion. +left straight leg raise.  Not examined in detail on 8/13 due to pain  CXR 8/11 BL interstitial infx , reviewed 8/13 mild improvement Labs show mild hyponatremia increase in creatinine from 1.2-2.0, increasing leukocytosis  Resolved Hospital Problem list   N/A  Assessment & Plan:  Septic Shock MSSA bacteremia TTE negative for vegetation Strep gallolyticus was a lab error -off pressors -cont cefazolin per ID-May need TEE  Acute hypoxic resp failure: Likely due to cardiogenic pulmonary edema vs septic emboli -cont HHFNC -Diuresis limited by worsening renal function  Afib with RVR (resolved): Now back in NSR -d/c amio and heparin infusion per cards.  AKI -follow indices and uop -has non obstructing renal stone on ct at presentation  -Rising creatinine related to Lasix,  COPD:  -cont bronchodilators  H/o HFpEF:  -diurese when able  H/o hypertension:  -holding home meds.   Hyperlipidemia:  -statin  H/o MI with VF arrest CAD -on plavix at baseline, can resume when able to take p.o.  Pulmonary nodules:  -outpt f/u  Acute on chronic lumbar back pain in the setting of MSSA bacteria: Severe degenerative joint changes and canal stenosis on CT & MRI  2 mm nonobstructive left renal calculus. -MRI confirmed -We will get neurosurgical opinion -morphine prn , ct oxycodone  Nausea:  -zofran prn  Adrenal nodules: CT on admission showed left adrenal nodules up 16 mm.  Confirmed on MRI  Best Practice (right  click and "Reselect all SmartList Selections" daily)   Diet/type: NPO DVT prophylaxis: prophylactic heparin  GI prophylaxis: PPI Lines: N/A Foley:  Yes, and it is still needed Code Status:  full code Last date of multidisciplinary goals of care discussion [8/11 with pt and father. ]  Labs   CBC: Recent Labs  Lab 06/18/21 1412 06/19/21 0322 06/20/21 0553 06/21/21 0703  WBC 24.9* 24.9* 27.4* 33.5*  NEUTROABS 21.0*  --  24.0* 27.3*  HGB 13.6 12.1 12.0 10.3*  HCT 43.7 38.6 37.3 32.3*  MCV 90.5 90.8 90.1 90.5  PLT 231 238 288 PLATELET CLUMPS NOTED ON SMEAR, UNABLE TO ESTIMATE     Basic Metabolic Panel: Recent Labs  Lab 06/18/21 1412 06/18/21 1805 06/19/21 0322 06/20/21 0553 06/21/21 0703  NA 137  --  135 135 133*  K 3.6  --  3.7 4.1 3.8  CL 106  --  104 102 101  CO2 17*  --  20* 24 21*  GLUCOSE 117*  --  139* 145* 141*  BUN 23  --  19 39* 51*  CREATININE 1.15*  --  1.03* 1.23* 2.03*  CALCIUM 8.5*  --  7.6* 7.7* 7.3*  MG  --  2.0  --  2.5*  --     GFR: Estimated Creatinine Clearance: 24 mL/min (A) (by C-G formula based on SCr of 2.03 mg/dL (H)). Recent Labs  Lab 06/18/21 1412 06/18/21 1530 06/18/21 1808 06/19/21 0322 06/20/21 0553 06/21/21 0703  PROCALCITON  --   --   --  3.28  --   --   WBC 24.9*  --   --  24.9* 27.4* 33.5*  LATICACIDVEN  --  2.0* 1.5  --   --   --      Liver Function Tests: No results for input(s): AST, ALT, ALKPHOS, BILITOT, PROT, ALBUMIN in the last 168 hours. No results for input(s): LIPASE, AMYLASE in the last 168 hours. No results for input(s): AMMONIA in the last 168 hours.  ABG    Component Value Date/Time   PHART 7.270 (L) 06/19/2021 0030   PCO2ART 45.3 06/19/2021 0030   PO2ART 89.9 06/19/2021 0030   HCO3 20.2 06/19/2021 0030   TCO2 30.1 11/03/2012 0600   ACIDBASEDEF 5.6 (H) 06/19/2021 0030   O2SAT 95.7 06/19/2021 0030      Coagulation Profile: No results for input(s): INR, PROTIME in the last 168 hours.  Cardiac  Enzymes: No results for input(s): CKTOTAL, CKMB, CKMBINDEX, TROPONINI in the last 168 hours.  HbA1C: Hgb A1c MFr Bld  Date/Time Value Ref Range Status  10/29/2012 01:30 PM 5.8 (H) <5.7 % Final    Comment:    (NOTE)                                                                       According to the ADA  Clinical Practice Recommendations for 2011, when HbA1c is used as a screening test:  >=6.5%   Diagnostic of Diabetes Mellitus           (if abnormal result is confirmed) 5.7-6.4%   Increased risk of developing Diabetes Mellitus References:Diagnosis and Classification of Diabetes Mellitus,Diabetes [id number] 1):S62-S69 and Standards of Medical Care in         Diabetes - 2011,Diabetes [id number] (Suppl 1):S11-S61.    CBG: Recent Labs  Lab 06/20/21 1153 06/20/21 1541 06/22/21 0800  GLUCAP 117* 142* 77     Critical care time: 32 min     Kara Mead MD. FCCP. [facility] Pager : [phone]  If no response to pager , please call [phone] until 7 pm After 7:00 pm call Elink  [phone]   06/22/2021

## 2021-06-23 DIAGNOSIS — R7881 Bacteremia: Secondary | ICD-10-CM | POA: Diagnosis not present

## 2021-06-23 DIAGNOSIS — A419 Sepsis, unspecified organism: Secondary | ICD-10-CM | POA: Diagnosis not present

## 2021-06-23 DIAGNOSIS — I480 Paroxysmal atrial fibrillation: Secondary | ICD-10-CM | POA: Diagnosis not present

## 2021-06-23 DIAGNOSIS — J9601 Acute respiratory failure with hypoxia: Secondary | ICD-10-CM | POA: Diagnosis not present

## 2021-06-23 LAB — BASIC METABOLIC PANEL
Anion gap: 8 (ref 5–15)
BUN: 34 mg/dL — ABNORMAL HIGH (ref 8–23)
CO2: 21 mmol/L — ABNORMAL LOW (ref 22–32)
Calcium: 8 mg/dL — ABNORMAL LOW (ref 8.9–10.3)
Chloride: 106 mmol/L (ref 98–111)
Creatinine, Ser: 0.95 mg/dL (ref 0.44–1.00)
GFR, Estimated: >60 mL/min (ref >60)
Glucose, Bld: 106 mg/dL — ABNORMAL HIGH (ref 70–99)
Potassium: 3.5 mmol/L (ref 3.5–5.1)
Sodium: 135 mmol/L (ref 135–145)

## 2021-06-23 LAB — CBC
HCT: 35.4 % — ABNORMAL LOW (ref 36.0–46.0)
Hemoglobin: 11.5 g/dL — ABNORMAL LOW (ref 12.0–15.0)
MCH: 28.5 pg (ref 26.0–34.0)
MCHC: 32.5 g/dL (ref 30.0–36.0)
MCV: 87.8 fL (ref 80.0–100.0)
Platelets: 370 10*3/uL (ref 150–400)
RBC: 4.03 MIL/uL (ref 3.87–5.11)
RDW: 18.3 % — ABNORMAL HIGH (ref 11.5–15.5)
WBC: 20.4 10*3/uL — ABNORMAL HIGH (ref 4.0–10.5)
nRBC: 0 % (ref 0.0–0.2)

## 2021-06-23 LAB — MAGNESIUM: Magnesium: 2.7 mg/dL — ABNORMAL HIGH (ref 1.7–2.4)

## 2021-06-23 LAB — PHOSPHORUS: Phosphorus: 1.6 mg/dL — ABNORMAL LOW (ref 2.5–4.6)

## 2021-06-23 MED ORDER — METOPROLOL TARTRATE 25 MG PO TABS
25.0000 mg | ORAL_TABLET | Freq: Two times a day (BID) | ORAL | Status: DC
  Administered 2021-06-23 – 2021-06-25 (×5): 25 mg via ORAL
  Filled 2021-06-23 (×5): qty 1

## 2021-06-23 MED ORDER — FUROSEMIDE 40 MG PO TABS
40.0000 mg | ORAL_TABLET | Freq: Every day | ORAL | Status: DC
  Administered 2021-06-23: 40 mg via ORAL
  Filled 2021-06-23 (×2): qty 1

## 2021-06-23 MED ORDER — FENTANYL CITRATE (PF) 100 MCG/2ML IJ SOLN: 12.5000 ug | Freq: Four times a day (QID) | INTRAMUSCULAR | Status: DC | PRN

## 2021-06-23 MED ORDER — CEFAZOLIN SODIUM-DEXTROSE 2-4 GM/100ML-% IV SOLN
2.0000 g | Freq: Three times a day (TID) | INTRAVENOUS | Status: DC
  Administered 2021-06-23 – 2021-06-25 (×7): 2 g via INTRAVENOUS
  Filled 2021-06-23 (×10): qty 100

## 2021-06-23 MED ORDER — CLOPIDOGREL BISULFATE 75 MG PO TABS
75.0000 mg | ORAL_TABLET | Freq: Every day | ORAL | Status: DC
  Administered 2021-06-23 – 2021-06-25 (×3): 75 mg via ORAL
  Filled 2021-06-23 (×3): qty 1

## 2021-06-23 MED ORDER — POTASSIUM PHOSPHATES 15 MMOLE/5ML IV SOLN
45.0000 mmol | Freq: Once | INTRAVENOUS | Status: AC
  Administered 2021-06-23: 45 mmol via INTRAVENOUS
  Filled 2021-06-23: qty 15

## 2021-06-23 NOTE — Progress Notes (Signed)
NAME:  Samantha King, MRN:  [id number], DOB:  1945/12/28, LOS: 5 ADMISSION DATE:  06/18/2021, CONSULTATION DATE:  06/20/21 REFERRING MD:  TRH, CHIEF COMPLAINT:  afib rvr and shock   History of Present Illness:  75 yo female who was in normal state of health presented 8/11 with severe back pain,  Pt was admitted found to have mssa bacteremia. CT chest showed changes of emphysema and vague nodular opacities.  CT abd/pelvis showed multiple Lt adrenal nodules.  She was started on appropriate therapy and was awaiting imaging of back. Unfortunately on 8/11 pt decompensated and req transfer to ICU 2/2 afib with rvr, hypotension on cardizem req pressors and hypoxemia req hhfnc.  Pertinent  Medical History  CAD  H/o vf arrest Hyperlipidemia Htn Hfpef COPD  Significant Hospital Events: Including procedures, antibiotic start and stop dates in addition to other pertinent events   8/10: ID consulted 2/2 MSSA bacteremia 8/11: transferred to ICU for afib rvr and septic shock, cardiology consulted as well.  Back pain 0/10 at rest but increases to 10/10 with movement.  8/12 Converted to sinus rhythm, amiodarone and heparin discontinued, on high flow nasal cannula 20 L / 40% 8/12 MRI abd - benign left adrenal adenomas.  LLL pna and small effusions MRI TL spine without evidence of infection but significant chronic spinal changes.   Interim History / Subjective:   Was extremely sedated even with low-dose 12.5 mcg fentanyl yesterday. Developed A. fib/RVR 6 PM, reverted back to sinus rhythm with low-dose Cardizem drip 5 mg/h Afebrile. Remains on high flow nasal cannula 50% / 20 L  Objective   Blood pressure (!) 146/75, pulse 85, temperature 97.8 F (36.6 C), temperature source Axillary, resp. rate (!) 21, height '5\' 3"'$  (1.6 m), weight 84.8 kg, SpO2 92 %.    FiO2 (%):  [40 %-50 %] 50 %   Intake/Output Summary (Last 24 hours) at 06/23/2021 0827 Last data filed at 06/23/2021 0600 Gross per 24 hour   Intake 500.25 ml  Output 1275 ml  Net -774.75 ml    Filed Weights   06/18/21 2349  Weight: 84.8 kg    Examination: General: acutely ill appearing, no acute distress HENT: NCAT, high flow nasal cannula ,.  No JVD, pallor Lungs: No accessory muscle use, clear to auscultation, no rhonchi Cardiovascular: Regular rate and rhythm, no murmurs Abdomen: soft, mildly distended, mildly diffuse ttp.  Extremities: lower extremities cold, feet with some purple discoloration, self reported hx Raynaud Neuro: Awake, interactive, follows commands, no focal weakness  CXR 8/13 independently reviewed shows patchy bilateral infiltrates Labs show stable mild hyponatremia, mild hypokalemia and hypophosphatemia, decrease leukocytosis  Resolved Hospital Problem list   N/A  Assessment & Plan:  Septic Shock MSSA bacteremia TTE negative for vegetation Strep gallolyticus was a lab error -off pressors and leukocytosis improving -cont cefazolin per ID-May need TEE  Acute hypoxic resp failure: Likely due to cardiogenic pulmonary edema vs septic emboli, favor latter based on nodular infiltrates on CT angio chest -cont HHFNC -Diuresis only for equal balance   Afib with RVR (resolved): Now back in NSR -Can discontinue Cardizem drip -Resume home metoprolol -8/12 amio and heparin infusion dc'd per cards.  AKI , resolved ?  Contrast versus Lasix -follow creat and uop -has non obstructing renal stone on ct at presentation  -Hypokalemia and hypophosphatemia will be repleted   COPD: Emphysema confirmed on CT -cont bronchodilators  H/o HFpEF:  -diurese for equal balance  Hyperlipidemia:  -statin  H/o MI  with VF arrest CAD -on plavix at baseline, can resume   Pulmonary nodules:  -outpt f/u scan in 3 to 6 months  Acute on chronic lumbar back pain in the setting of MSSA bacteria: Severe degenerative joint changes and canal stenosis on CT & MRI  2 mm nonobstructive left renal calculus. -MRI  confirmed -No surgical intervention per neurosurgery - ct oxycodone , added standing Tylenol and gabapentin -Try to avoid fentanyl unless absolutely necessary   Adrenal nodules: CT on admission showed left adrenal nodules up 16 mm.  Confirmed on MRI SMA narrowing on CT angio -but no clinical symptoms at this time  Best Practice (right click and "Reselect all SmartList Selections" daily)   Diet/type: Regular consistency (see orders) DVT prophylaxis: prophylactic heparin  GI prophylaxis: PPI Lines: N/A Foley:  Yes, and it is still needed Code Status:  full code Last date of multidisciplinary goals of care discussion [8/11 with pt and husband ]  Labs   CBC: Recent Labs  Lab 06/18/21 1412 06/19/21 0322 06/20/21 0553 06/21/21 0703 06/22/21 1758 06/23/21 0300  WBC 24.9* 24.9* 27.4* 33.5* 24.9* 20.4*  NEUTROABS 21.0*  --  24.0* 27.3*  --   --   HGB 13.6 12.1 12.0 10.3* 12.6 11.5*  HCT 43.7 38.6 37.3 32.3* 39.4 35.4*  MCV 90.5 90.8 90.1 90.5 87.6 87.8  PLT 231 238 288 PLATELET CLUMPS NOTED ON SMEAR, UNABLE TO ESTIMATE 277 370     Basic Metabolic Panel: Recent Labs  Lab 06/18/21 1805 06/19/21 0322 06/20/21 0553 06/21/21 0703 06/22/21 1858 06/22/21 2027 06/22/21 2059 06/23/21 0300  NA  --    < > 135 133* 136 135  --  135  K  --    < > 4.1 3.8 3.7 3.5  --  3.5  CL  --    < > 102 101 103 105  --  106  CO2  --    < > 24 21* 23 21*  --  21*  GLUCOSE  --    < > 145* 141* 138* 119*  --  106*  BUN  --    < > 39* 51* 35* 36*  --  34*  CREATININE  --    < > 1.23* 2.03* 1.15* 1.02*  --  0.95  CALCIUM  --    < > 7.7* 7.3* 7.9* 8.0*  --  8.0*  MG 2.0  --  2.5*  --   --   --  2.6* 2.7*  PHOS  --   --   --   --   --   --   --  1.6*   < > = values in this interval not displayed.    GFR: Estimated Creatinine Clearance: 51.2 mL/min (by C-G formula based on SCr of 0.95 mg/dL). Recent Labs  Lab 06/18/21 1530 06/18/21 1808 06/19/21 0322 06/20/21 0553 06/21/21 0703  06/22/21 1758 06/23/21 0300  PROCALCITON  --   --  3.28  --   --   --   --   WBC  --   --  24.9* 27.4* 33.5* 24.9* 20.4*  LATICACIDVEN 2.0* 1.5  --   --   --   --   --      Liver Function Tests: No results for input(s): AST, ALT, ALKPHOS, BILITOT, PROT, ALBUMIN in the last 168 hours. No results for input(s): LIPASE, AMYLASE in the last 168 hours. No results for input(s): AMMONIA in the last 168 hours.  ABG    Component  Value Date/Time   PHART 7.270 (L) 06/19/2021 0030   PCO2ART 45.3 06/19/2021 0030   PO2ART 89.9 06/19/2021 0030   HCO3 20.2 06/19/2021 0030   TCO2 30.1 11/03/2012 0600   ACIDBASEDEF 5.6 (H) 06/19/2021 0030   O2SAT 95.7 06/19/2021 0030      Coagulation Profile: No results for input(s): INR, PROTIME in the last 168 hours.  Cardiac Enzymes: No results for input(s): CKTOTAL, CKMB, CKMBINDEX, TROPONINI in the last 168 hours.  HbA1C: Hgb A1c MFr Bld  Date/Time Value Ref Range Status  10/29/2012 01:30 PM 5.8 (H) <5.7 % Final    Comment:    (NOTE)                                                                       According to the ADA Clinical Practice Recommendations for 2011, when HbA1c is used as a screening test:  >=6.5%   Diagnostic of Diabetes Mellitus           (if abnormal result is confirmed) 5.7-6.4%   Increased risk of developing Diabetes Mellitus References:Diagnosis and Classification of Diabetes Mellitus,Diabetes [id number] 1):S62-S69 and Standards of Medical Care in         Diabetes - 2011,Diabetes [id number] (Suppl 1):S11-S61.    CBG: Recent Labs  Lab 06/20/21 1153 06/20/21 1541 06/22/21 0800  GLUCAP 117* 142* 77     Critical care time: 31 min     Kara Mead MD. FCCP. [facility] Pager : [phone]  If no response to pager , please call [phone] until 7 pm After 7:00 pm call Elink  [phone]   06/23/2021

## 2021-06-23 NOTE — Progress Notes (Signed)
Patient did not want to change positions and education done once again pt verbalizes understanding but agreed to be turned for medication to be applied. Patient in 10/10 pain when moved. Patient ask " Am I going to live through this?" Pt reassured that she is getting the appropriate meds and emotional support given.

## 2021-06-23 NOTE — Progress Notes (Signed)
[facility] ADULT ICU REPLACEMENT PROTOCOL   The patient does apply for the [facility] Adult ICU Electrolyte Replacment Protocol based on the criteria listed below:   1.Exclusion criteria: TCTS patients, ECMO patients and Hypothermia Protocol, and   Dialysis patients 2. Is GFR >/= 30 ml/min? Yes.    Patient's GFR today is >60 3. Is SCr </= 2? Yes.   Patient's SCr is 0.95 mg/dL 4. Did SCr increase >/= 0.5 in 24 hours? No. 5.Pt's weight >40kg  No. 6. Abnormal electrolyte(s): Phos 1.6, K+ 3.5  7. Electrolytes replaced per protocol 8.  Call MD STAT for K+ </= 2.5, Phos </= 1, or Mag </= 1 Physician:  Dr. Mauri Brooklyn  Carlisle Beers 06/23/2021 5:46 AM

## 2021-06-23 NOTE — Progress Notes (Signed)
Patient states 10/10 pain when moving and states that she does not want to move at this time. Education done, patient verbalizes understanding.

## 2021-06-24 ENCOUNTER — Inpatient Hospital Stay (HOSPITAL_COMMUNITY): Payer: Medicare Other

## 2021-06-24 DIAGNOSIS — B9561 Methicillin susceptible Staphylococcus aureus infection as the cause of diseases classified elsewhere: Secondary | ICD-10-CM | POA: Diagnosis not present

## 2021-06-24 DIAGNOSIS — J9601 Acute respiratory failure with hypoxia: Secondary | ICD-10-CM | POA: Diagnosis not present

## 2021-06-24 DIAGNOSIS — R7881 Bacteremia: Secondary | ICD-10-CM | POA: Diagnosis not present

## 2021-06-24 LAB — CBC
HCT: 33.3 % — ABNORMAL LOW (ref 36.0–46.0)
Hemoglobin: 10.7 g/dL — ABNORMAL LOW (ref 12.0–15.0)
MCH: 28.1 pg (ref 26.0–34.0)
MCHC: 32.1 g/dL (ref 30.0–36.0)
MCV: 87.4 fL (ref 80.0–100.0)
Platelets: 415 10*3/uL — ABNORMAL HIGH (ref 150–400)
RBC: 3.81 MIL/uL — ABNORMAL LOW (ref 3.87–5.11)
RDW: 18.3 % — ABNORMAL HIGH (ref 11.5–15.5)
WBC: 18.7 10*3/uL — ABNORMAL HIGH (ref 4.0–10.5)
nRBC: 0.1 % (ref 0.0–0.2)

## 2021-06-24 LAB — BASIC METABOLIC PANEL
Anion gap: 10 (ref 5–15)
BUN: 27 mg/dL — ABNORMAL HIGH (ref 8–23)
CO2: 25 mmol/L (ref 22–32)
Calcium: 8.3 mg/dL — ABNORMAL LOW (ref 8.9–10.3)
Chloride: 102 mmol/L (ref 98–111)
Creatinine, Ser: 0.85 mg/dL (ref 0.44–1.00)
GFR, Estimated: >60 mL/min (ref >60)
Glucose, Bld: 105 mg/dL — ABNORMAL HIGH (ref 70–99)
Potassium: 4 mmol/L (ref 3.5–5.1)
Sodium: 137 mmol/L (ref 135–145)

## 2021-06-24 LAB — PHOSPHORUS: Phosphorus: 2.8 mg/dL (ref 2.5–4.6)

## 2021-06-24 LAB — MAGNESIUM: Magnesium: 2.5 mg/dL — ABNORMAL HIGH (ref 1.7–2.4)

## 2021-06-24 IMAGING — DX DG CHEST 1V
1 series · 1 of 1 positions shown · non-contrast
Comparison: [DATE]

CLINICAL DATA: Pneumonia, shortness of breath

EXAM:
CHEST  1 VIEW

[chest ap]
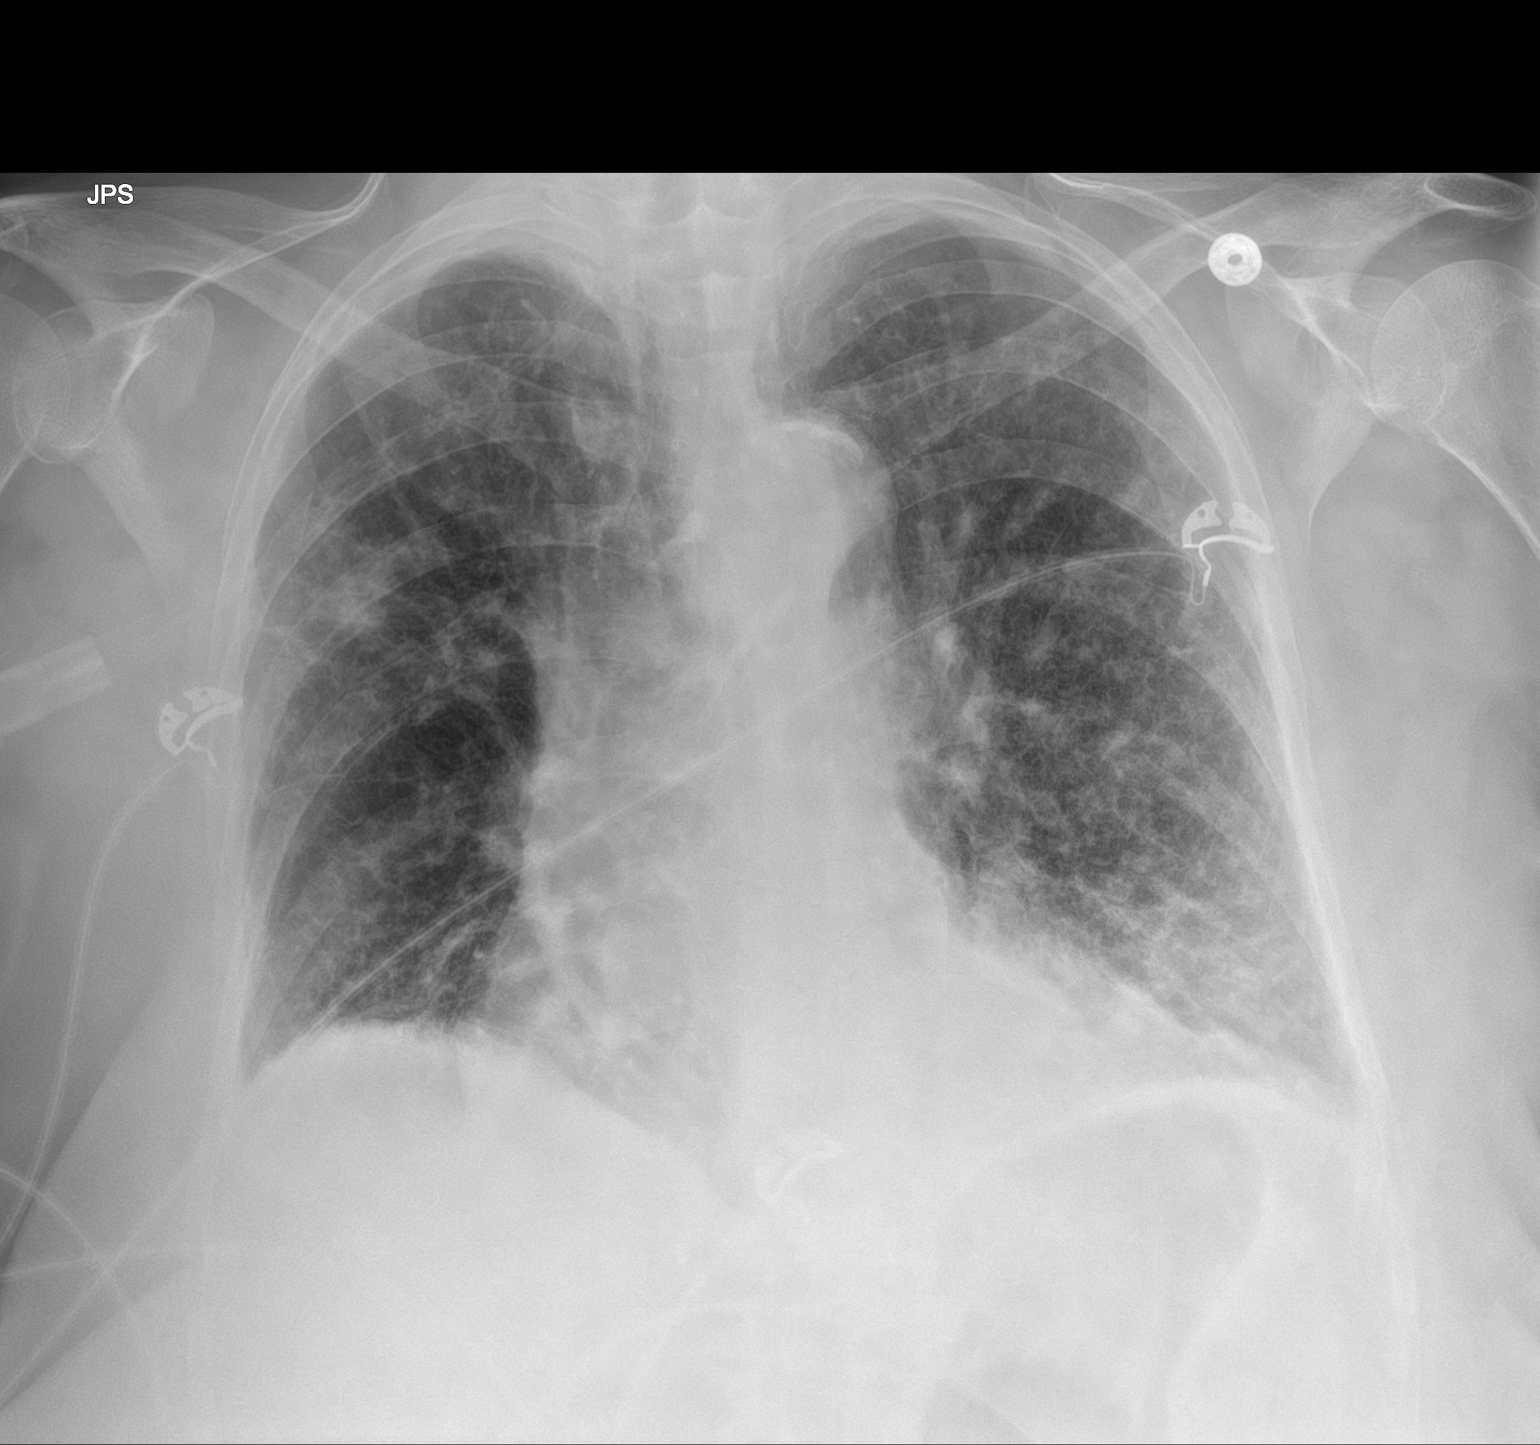

[1 of 1 positions shown; findings below may reference images not displayed]

FINDINGS: Widened vascular pedicle. No large pleural effusion. No
pneumothorax. Again seen are bilateral nodular and reticular
opacities which have increased in prominence. No acute osseous
abnormality.
IMPRESSION: Worsening bilateral pulmonary opacities.  No large effusion.

## 2021-06-24 MED ORDER — DULOXETINE HCL 30 MG PO CPEP
30.0000 mg | ORAL_CAPSULE | Freq: Every day | ORAL | Status: DC
  Administered 2021-06-24 – 2021-06-25 (×2): 30 mg via ORAL
  Filled 2021-06-24 (×2): qty 1

## 2021-06-24 MED ORDER — GABAPENTIN 100 MG PO CAPS: 200.0000 mg | ORAL_CAPSULE | Freq: Three times a day (TID) | ORAL | Status: DC

## 2021-06-24 MED ORDER — LIDOCAINE 5 % EX PTCH
1.0000 | MEDICATED_PATCH | CUTANEOUS | Status: DC
  Administered 2021-06-24 – 2021-06-25 (×2): 1 via TRANSDERMAL
  Filled 2021-06-24 (×2): qty 1

## 2021-06-24 MED ORDER — KETOROLAC TROMETHAMINE 15 MG/ML IJ SOLN
15.0000 mg | Freq: Three times a day (TID) | INTRAMUSCULAR | Status: DC
  Administered 2021-06-24: 15 mg via INTRAVENOUS
  Filled 2021-06-24: qty 1

## 2021-06-24 MED ORDER — KETOROLAC TROMETHAMINE 15 MG/ML IJ SOLN
15.0000 mg | Freq: Three times a day (TID) | INTRAMUSCULAR | Status: AC
  Administered 2021-06-24 – 2021-06-25 (×4): 15 mg via INTRAVENOUS
  Filled 2021-06-24 (×4): qty 1

## 2021-06-24 MED ORDER — FUROSEMIDE 10 MG/ML IJ SOLN
40.0000 mg | Freq: Two times a day (BID) | INTRAMUSCULAR | Status: AC
Start: 2021-06-24 — End: 2021-06-24
  Administered 2021-06-24 (×2): 40 mg via INTRAVENOUS
  Filled 2021-06-24 (×2): qty 4

## 2021-06-24 MED ORDER — GABAPENTIN 100 MG PO CAPS
100.0000 mg | ORAL_CAPSULE | Freq: Once | ORAL | Status: AC
  Administered 2021-06-24: 100 mg via ORAL
  Filled 2021-06-24: qty 1

## 2021-06-24 MED ORDER — CLONAZEPAM 0.25 MG PO TBDP
0.2500 mg | ORAL_TABLET | Freq: Two times a day (BID) | ORAL | Status: DC
  Administered 2021-06-24 – 2021-06-25 (×2): 0.25 mg via ORAL
  Filled 2021-06-24 (×2): qty 1

## 2021-06-24 MED ORDER — GABAPENTIN 100 MG PO CAPS
200.0000 mg | ORAL_CAPSULE | Freq: Three times a day (TID) | ORAL | Status: DC
  Administered 2021-06-24 – 2021-06-25 (×4): 200 mg via ORAL
  Filled 2021-06-24 (×4): qty 2

## 2021-06-24 NOTE — Progress Notes (Signed)
Inpatient Rehab Admissions Coordinator Note:   Per PT recommendation, pt was screened for CIR candidacy by Gayland Curry, MS, CCC-SLP.  Note pt planning to admit to [facility].  Note pt with limited ability to participate in therapy d/t pain.  Will not recommend an inpatient rehab consult.  Please contact me with questions.    Gayland Curry, [place], [place] Admissions Coordinator [phone] 06/24/21 6:39 PM

## 2021-06-24 NOTE — Progress Notes (Signed)
Physical Therapy Evaluation Patient Details Name: Samantha King MRN: [id number] DOB: [birth year] Today's Date: 06/24/2021   History of Present Illness  Pt is a 75 y.o. female admitted 06/18/21 with c/o lowe back pain with associated muscle spasms; found to be hypoxic in ED, suspect related to receiving opiates. CXR concerning for atelectasis, aspiration and/or PNA. Chest CT angiogram showing pulmonary and adrenal nodules, L renal stone. Lumbar CT shows multilevel degenerative changes, potential canal stenosis at L4-5, moderate L foraminal stenosis L2-4. Further workup revealed MSSA, suspect for vertebral infection; awaiting MRI. Also with concern for L knee septic joint; awaiting ortho consult. PMH includes CAD, HTN, MI, PNA, PUD, tobacco use.  Clinical Impression  Pt admitted with above diagnosis. Pt was able to tolerate sitting in the chair position in the bed during session as well as left pt in chair position. Could not tolerate much ROM of any of her extremities.  Nurse to medicate pt at end of session.  Placed ice on pts left knee per pt request.  MD trying to address pts pain so that she can incr participation. Pt currently with functional limitations due to the deficits listed below (see PT Problem List). Pt will benefit from skilled PT to increase their independence and safety with mobility to allow discharge to the venue listed below.       Follow Up Recommendations CIR;Supervision/Assistance - 24 hour    Equipment Recommendations  Other (comment) (TBA)    Recommendations for Other Services Rehab consult     Precautions / Restrictions Precautions Precautions: Fall;Back Precaution Comments: 40% heated HFNC on arrrival and pulmonary MD incr to 50% while PT in room Restrictions Weight Bearing Restrictions: No      Mobility  Bed Mobility Overal bed mobility: Needs Assistance Bed Mobility: Supine to Sit           General bed mobility comments: Pt limited by pain.  Supine  to  long sit in bed by progressively sitting bed in chair position with pt in full chair position.  Pt did a few exercises but can only lift left UE up about 20 degrees and could not lift right UE due to pain.  Moves bil LEs with trace movement all planes.    Transfers                 General transfer comment: Unable to progress to EOB due to severe pain  Ambulation/Gait             General Gait Details: Unable  Stairs            Wheelchair Mobility    Modified Rankin (Stroke Patients Only)       Balance                                             Pertinent Vitals/Pain Pain Assessment: 0-10 Pain Score: 10-Worst pain ever Pain Location: back and L knee Pain Descriptors / Indicators: Aching;Constant;Crying;Discomfort;Grimacing;Guarding;Jabbing Pain Intervention(s): Limited activity within patient's tolerance;Monitored during session;Repositioned;Patient requesting pain meds-RN notified;Ice applied    Home Living Family/patient expects to be discharged to:: Private residence Living Arrangements: Spouse/significant other Available Help at Discharge: Family;Available PRN/intermittently Type of Home: House Home Access: Stairs to enter Entrance Stairs-Rails: Can reach both Entrance Stairs-Number of Steps: 4 steps Home Layout: One level Home Equipment: Walker - 2 wheels;Shower seat - built in Additional Comments:  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; 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  Pt still driving etc     Hand Dominance   Dominant Hand: Right    Extremity/Trunk Assessment   Upper Extremity Assessment Upper Extremity Assessment: Defer to OT evaluation    Lower Extremity Assessment Lower Extremity Assessment: RLE deficits/detail;LLE  deficits/detail RLE Deficits / Details: Hip 1/5, knee 1/5, ankle 1/5; limited mostly due to pain RLE: Unable to fully assess due to pain LLE Deficits / Details: Hip 1/5, knee 1/5, ankle 1/5; limited mostly due to pain LLE: Unable to fully assess due to pain    Cervical / Trunk Assessment Cervical / Trunk Assessment: Kyphotic;Other exceptions  Communication   Communication: No difficulties  Cognition Arousal/Alertness: Awake/alert Behavior During Therapy: WFL for tasks assessed/performed Overall Cognitive Status: Within Functional Limits for tasks assessed                                        General Comments General comments (skin integrity, edema, etc.): 93 bpm, 88% 18, 147/77 initially and 171/80 at end of session with pt on 50% heated HFNC at end of session. Sats fluctuating 85-91% with activity.    Exercises General Exercises - Upper Extremity Shoulder Flexion: AAROM;Both;5 reps;Supine Elbow Flexion: AAROM;Both;5 reps;Supine General Exercises - Lower Extremity Ankle Circles/Pumps: AAROM;Both;5 reps;Supine Quad Sets: AROM;Both;5 reps;Supine Heel Slides: AAROM;Both;5 reps;Supine   Assessment/Plan    PT Assessment Patient needs continued PT services  PT Problem List Decreased activity tolerance;Decreased balance;Decreased mobility;Decreased knowledge of use of DME;Decreased safety awareness;Decreased knowledge of precautions;Cardiopulmonary status limiting activity;Pain;Decreased strength;Decreased range of motion       PT Treatment Interventions DME instruction;Functional mobility training;Therapeutic activities;Therapeutic exercise;Balance training;Patient/family education    PT Goals (Current goals can be found in the Care Plan section)  Acute Rehab PT Goals Patient Stated Goal: To stop the pain PT Goal Formulation: With patient Time For Goal Achievement: 07/08/21 Potential to Achieve Goals: Good    Frequency Min 3X/week   Barriers to discharge  Decreased caregiver support      Co-evaluation               AM-PAC PT "6 Clicks" Mobility  Outcome Measure Help needed turning from your back to your side while in a flat bed without using bedrails?: Total Help needed moving from lying on your back to sitting on the side of a flat bed without using bedrails?: Total Help needed moving to and from a bed to a chair (including a wheelchair)?: Total Help needed standing up from a chair using your arms (e.g., wheelchair or bedside chair)?: Total Help needed to walk in hospital room?: Total Help needed climbing 3-5 steps with a railing? : Total 6 Click Score: 6    End of Session Equipment Utilized During Treatment: Oxygen Activity Tolerance: Patient limited by fatigue;Patient limited by pain Patient left: in bed;with call bell/phone within reach;with bed alarm set;with family/visitor present;with SCD's reapplied (in chair position) Nurse Communication: Mobility status;Patient requests pain meds PT Visit Diagnosis: Muscle weakness (generalized) (M62.81);Pain Pain - part of body:  (left knee, neck  and back)    Time: [id number] PT Time Calculation (min) (  ACUTE ONLY): 31 min   Charges:   PT Evaluation $PT Eval Moderate Complexity: 1 Mod PT Treatments $Therapeutic Activity: 8-22 mins        Kazue Cerro M,PT Acute Rehab Services [phone] (pager)   Alvira Philips 06/24/2021, 12:58 PM

## 2021-06-24 NOTE — TOC Initial Note (Signed)
Transition of Care [facility]) - Initial/Assessment Note    Patient Details  Name: Samantha King MRN: [id number] Date of Birth: 13-Dec-1945  [facility] CM/SW Contact:    Ella Bodo, RN Phone Number: 06/24/2021, 1:45pm  Clinical Narrative:   Pt is a 75 y.o. female admitted 06/18/21 with c/o lower back pain with associated muscle spasms; found to be hypoxic in ED, suspect related to receiving opiates. CXR concerning for atelectasis, aspiration and/or PNA. Chest CT angiogram showing pulmonary and adrenal nodules, L renal stone. Lumbar CT shows multilevel degenerative changes, potential canal stenosis at L4-5, moderate L foraminal stenosis L2-4. Further workup revealed MSSA, suspect for vertebral infection. Prior to admission, patient independent and living at home with spouse.  LTAC consult received today; met with patient, husband, and son at bedside.  We discussed available LTAC hospitals in the area; spouse and son prefer [facility].  Referral to Arley Phenix, admissions coordinator with [facility]; [facility] to follow-up with husband and son to answer questions, provide information.  Will follow for medical readiness/bed availability.                 Expected Discharge Plan: Long Term Acute Care (LTAC) Barriers to Discharge: Continued Medical Work up   Patient Goals and CMS Choice   CMS Medicare.gov Compare Post Acute Care list provided to:: Patient Represenative (must comment) (husband) Choice offered to / list presented to : Spouse  Expected Discharge Plan and Services Expected Discharge Plan: Long Term Acute Care (LTAC)   Discharge Planning Services: CM Consult Post Acute Care Choice: Long Term Acute Care (LTAC) Living arrangements for the past 2 months: Single Family Home                                      Prior Living Arrangements/Services Living arrangements for the past 2 months: Single Family Home Lives with::  Spouse Patient language and need for interpreter reviewed:: Yes Do you feel safe going back to the place where you live?: Yes      Need for Family Participation in Patient Care: Yes (Comment) Care giver support system in place?: Yes (comment)   Criminal Activity/Legal Involvement Pertinent to Current Situation/Hospitalization: No - Comment as needed  Activities of Daily Living Home Assistive Devices/Equipment: None ADL Screening (condition at time of admission) Patient's cognitive ability adequate to safely complete daily activities?: Yes Is the patient deaf or have difficulty hearing?: No Does the patient have difficulty seeing, even when wearing glasses/contacts?: No Does the patient have difficulty concentrating, remembering, or making decisions?: No Patient able to express need for assistance with ADLs?: Yes Does the patient have difficulty dressing or bathing?: No Independently performs ADLs?: Yes (appropriate for developmental age) Does the patient have difficulty walking or climbing stairs?: No Weakness of Legs: Both Weakness of Arms/Hands: None  Permission Sought/Granted                  Emotional Assessment Appearance:: Appears stated age Attitude/Demeanor/Rapport: Engaged Affect (typically observed): Accepting Orientation: : Oriented to Self, Oriented to Place, Oriented to  Time, Oriented to Situation      Admission diagnosis:  Low back pain [M54.50] CAP (community acquired pneumonia) [J18.9] Back pain [M54.9] Patient Active Problem List   Diagnosis Date Noted   Paroxysmal atrial fibrillation (HCC)    Hypoxemia    Back pain 06/19/2021   Septic shock ([place]) 06/19/2021  COPD with acute exacerbation ([place]) 06/19/2021   Acute hypoxemic respiratory failure ([place]) 06/19/2021   Pulmonary nodules 06/19/2021   MSSA bacteremia 06/19/2021   Upper GI bleed 03/10/2020   Nausea 03/10/2020   Duodenal ulcer    Dieulafoy lesion of duodenum    Gastritis and  gastroduodenitis    Coronary atherosclerosis of native coronary artery 11/10/2012   Tobacco abuse 11/09/2012   NSVT (nonsustained ventricular tachycardia) ([place]) 11/09/2012   Acute paranoia ([place]) 11/09/2012   Severe muscle deconditioning 11/09/2012   Hyperlipidemia 11/09/2012   Hypokalemia 11/05/2012   Acute blood loss anemia 11/01/2012   H1N1 influenza with pneumonia 10/31/2012   Hypertension 10/29/2012   GERD (gastroesophageal reflux disease) 10/29/2012   Acute respiratory failure ([place]) 10/29/2012   Cardiogenic shock ([place]) 10/29/2012   Cardiac arrest ([place]) 10/29/2012   ST elevation myocardial infarction (STEMI) of inferior wall ([place]) 10/29/2012   PCP:  Haywood Pao, MD Pharmacy:   [facility] [address], [place] - [place] [address] [place] [address] Phone: [phone] Fax: [phone]     Social Determinants of Health (SDOH) Interventions    Readmission Risk Interventions No flowsheet data found.  Reinaldo Raddle, RN, BSN  Trauma/Neuro ICU Case Manager [phone]

## 2021-06-24 NOTE — Plan of Care (Signed)
  Problem: Education: Goal: Knowledge of General Education information will improve Description Including pain rating scale, medication(s)/side effects and non-pharmacologic comfort measures Outcome: Progressing   Problem: Health Behavior/Discharge Planning: Goal: Ability to manage health-related needs will improve Outcome: Progressing   

## 2021-06-24 NOTE — Progress Notes (Signed)
0119 patient had 6 beats of Vtach then converted back to NSR. Elink was notified. Spoke with Jeral Fruit, RN orders given to obtain am labs now. Phlebotomy notified.

## 2021-06-24 NOTE — Progress Notes (Signed)
[facility] for Infectious Disease    Date of Admission:  06/18/2021   Total days of antibiotics 7/day 6 cefazolin           ID: Samantha King is a 75 y.o. female with  complicated MSSA bacteremia, with multiple pulmonary nodule/? Septic emboli, and intractable back pain though MRI does not show discitis/osteo/myositis Principal Problem:   MSSA bacteremia Active Problems:   Back pain   Septic shock (HCC)   COPD with acute exacerbation (HCC)   Acute hypoxemic respiratory failure (HCC)   Pulmonary nodules   Paroxysmal atrial fibrillation (HCC)   Hypoxemia    Subjective: Afebrile, but still having intractable back pain. Sedated at times from pain medication. Leukocytosis improving/trending downward Medications:   acetaminophen  650 mg Oral Q4H   atorvastatin  80 mg Oral q1800   budesonide (PULMICORT) nebulizer solution  0.5 mg Nebulization BID   Chlorhexidine Gluconate Cloth  6 each Topical Daily   clonazepam  0.25 mg Oral BID   clopidogrel  75 mg Oral Daily   diclofenac Sodium  4 g Topical QID   DULoxetine  30 mg Oral Daily   furosemide  40 mg Intravenous Q12H   gabapentin  200 mg Oral TID   heparin injection (subcutaneous)  5,000 Units Subcutaneous Q8H   ketorolac  15 mg Intravenous Q8H   lidocaine  1 patch Transdermal Q24H   metoprolol tartrate  25 mg Oral BID   pantoprazole  40 mg Oral Daily    Objective: Vital signs in last 24 hours: Temp:  [97.9 F (36.6 C)-98.2 F (36.8 C)] 97.9 F (36.6 C) (08/15 0507) Pulse Rate:  [60-90] 84 (08/15 1500) Resp:  [12-24] 17 (08/15 1500) BP: (91-175)/(56-97) 175/87 (08/15 1500) SpO2:  [87 %-96 %] 87 % (08/15 1500) FiO2 (%):  [40 %-45 %] 40 % (08/15 1452)  Physical Exam  Constitutional:  oriented to person, place, and time. appears well-developed and well-nourished. No distress.  HENT: [place]/AT, PERRLA, no scleral icterus Mouth/Throat: Oropharynx is clear and moist. No oropharyngeal exudate.  Cardiovascular: Normal  rate, regular rhythm and normal heart sounds. Exam reveals no gallop and no friction rub.  No murmur heard.  Pulmonary/Chest: Effort normal and breath sounds normal. No respiratory distress.  has no wheezes.  Neck = supple, no nuchal rigidity Abdominal: Soft. Bowel sounds are normal.  exhibits no distension. There is no tenderness.  Lymphadenopathy: no cervical adenopathy. No axillary adenopathy Neurological: alert and oriented to person, place, and time. Movement of lower extremities limited by back pain Skin: Skin is warm and dry. No rash noted. No erythema.  Psychiatric: a normal mood and affect.  behavior is normal.    Lab Results Recent Labs    06/23/21 0300 06/24/21 0151  WBC 20.4* 18.7*  HGB 11.5* 10.7*  HCT 35.4* 33.3*  NA 135 137  K 3.5 4.0  CL 106 102  CO2 21* 25  BUN 34* 27*  CREATININE 0.95 0.85   Lab Results  Component Value Date   ESRSEDRATE 64 (H) 06/20/2021   Lab Results  Component Value Date   CRP 31.8 (H) 06/20/2021    Microbiology: 8/11 blood cx NGTD 8/9 blod cx MSSA Studies/Results: DG Chest 1 View  Result Date: 06/24/2021 CLINICAL DATA:  Pneumonia, shortness of breath EXAM: CHEST  1 VIEW COMPARISON:  June 22, 2021 FINDINGS: Widened vascular pedicle. No large pleural effusion. No pneumothorax. Again seen are bilateral nodular and reticular opacities which have increased in prominence. No  acute osseous abnormality. IMPRESSION: Worsening bilateral pulmonary opacities.  No large effusion. Electronically Signed   By: Albin Felling M.D.   On: 06/24/2021 09:25     Assessment/Plan: Complicated MSSA bacteremia, presumed endocarditis with pulmonary septic emboli and questionable discitis = continue on cefazolin. Blood cx have cleared her bacteremia. TTE is negative for vegetation, but not as sensitive as TEE. If patient could tolerate TEE in the coming days, would be helpful to ensure that she doesn't need cards/CT surgery follow-up. For the time being plan  to treat for 6 wk of IV abtx   Back pain = out of proportion from chronic changes noted on imaging. MRI of T-L did not suggest infectious causes but has severe stenosis with possible impingement in lumbar region. Medications being arranged to include less sedation so that can participate in physical therapy. Will continue to monitor for improvement  Pneumonia/bilateral pulmonary infiltrates = thought to be related to pulm septic emboli. Agree with PCCM plan for diuresis.  Left knee pain = on exam, no effusion, nor erythema.   [facility] Cell: [phone] Pager: [phone]  06/24/2021, 4:56 PM

## 2021-06-24 NOTE — Progress Notes (Signed)
NAME:  Samantha King, MRN:  [id number], DOB:  24-Feb-1946, LOS: 6 ADMISSION DATE:  06/18/2021, CONSULTATION DATE:  06/20/21 REFERRING MD:  TRH, CHIEF COMPLAINT:  afib rvr and shock   History of Present Illness:  75 yo female who was in normal state of health presented 8/11 with severe back pain,  Pt was admitted found to have mssa bacteremia. CT chest showed changes of emphysema and vague nodular opacities.  CT abd/pelvis showed multiple Lt adrenal nodules.  She was started on appropriate therapy and was awaiting imaging of back. Unfortunately on 8/11 pt decompensated and req transfer to ICU 2/2 afib with rvr, hypotension on cardizem req pressors and hypoxemia req hhfnc.  Pertinent  Medical History  CAD  H/o vf arrest Hyperlipidemia Htn Hfpef COPD  Significant Hospital Events: Including procedures, antibiotic start and stop dates in addition to other pertinent events   8/10: ID consulted 2/2 MSSA bacteremia 8/11: transferred to ICU for afib rvr and septic shock, cardiology consulted as well.  Back pain 0/10 at rest but increases to 10/10 with movement.  8/12 MRI abd - benign left adrenal adenomas.  LLL pna and small effusions MRI TL spine without evidence of infection but significant chronic spinal changes.   Interim History / Subjective:  Ongoing severe pain with neuropathic component  Objective   Blood pressure (!) 147/77, pulse 69, temperature 97.9 F (36.6 C), temperature source Axillary, resp. rate 15, height '5\' 3"'$  (1.6 m), weight 84.8 kg, SpO2 (!) 89 %.    FiO2 (%):  [40 %-45 %] 40 %   Intake/Output Summary (Last 24 hours) at 06/24/2021 0910 Last data filed at 06/24/2021 0600 Gross per 24 hour  Intake 695.98 ml  Output 520 ml  Net 175.98 ml    Filed Weights   06/18/21 2349  Weight: 84.8 kg    Examination: Ill appearing woman sitting up in bed Excruciating pain with movement of L leg and R arm with lightning-like quality that radiates to thoracic area Shallow  inspiratory efforts  Cr good WBC improved Plts trending up  Resolved Hospital Problem list   Afib/RVR reactive  Assessment & Plan:  Septic Shock MSSA bacteremia Severe back pain, very sensitive to fentanyl Severe hypoxemic respiratory failure due to suspected volume overload + septic emboli COPD, severe by history and imaging High risk for progressing to intubation H/o MI with VF arrest CAD Adrenal adenomas  TTE neg for vegetation and MRI spine without clear infection; however in light of excruciating back pain, new onset out of proportion to imaging, nodular infiltrates I would treat as IE empirically.  - Cefazolin x 8 weeks - Push diuresis - Lidocaine patches, increase gabapentin, toradol, start low dose klonipin - Encourage IS - I see mention of neurosurgery consultation, not good operative candidate at present but I guess could bring on board should respiratory status improve - Could consider epidural but not excited to do this while question of infection  Best Practice (right click and "Reselect all SmartList Selections" daily)   Diet/type: Regular DVT prophylaxis: prophylactic heparin  GI prophylaxis: PPI Lines: N/A Foley:  Yes, and it is still needed Code Status:  full code Last date of multidisciplinary goals of care discussion [8/11 with pt and father. ]  Patient critically ill due to respiratory failure Interventions to address this today HFNC titration (currently 25L 50% FiO2), intubation watch Risk of deterioration without these interventions is high  I personally spent 39 minutes providing critical care not including any separately billable  procedures  Erskine Emery MD [facility]  Prefer epic messenger for cross cover needs If after hours, please call E-link

## 2021-06-25 ENCOUNTER — Inpatient Hospital Stay
Admission: RE | Admit: 2021-06-25 | Discharge: 2021-07-10 | Disposition: A | Discharge status: Rehabilitation Facility | Payer: Medicare Other | Source: Ambulatory Visit | Attending: Internal Medicine | Admitting: Internal Medicine

## 2021-06-25 ENCOUNTER — Inpatient Hospital Stay: Payer: Self-pay

## 2021-06-25 DIAGNOSIS — R5381 Other malaise: Secondary | ICD-10-CM | POA: Diagnosis not present

## 2021-06-25 DIAGNOSIS — R7881 Bacteremia: Secondary | ICD-10-CM | POA: Diagnosis not present

## 2021-06-25 DIAGNOSIS — J9600 Acute respiratory failure, unspecified whether with hypoxia or hypercapnia: Secondary | ICD-10-CM | POA: Diagnosis not present

## 2021-06-25 DIAGNOSIS — N179 Acute kidney failure, unspecified: Secondary | ICD-10-CM | POA: Diagnosis present

## 2021-06-25 DIAGNOSIS — J189 Pneumonia, unspecified organism: Secondary | ICD-10-CM | POA: Diagnosis not present

## 2021-06-25 DIAGNOSIS — I2510 Atherosclerotic heart disease of native coronary artery without angina pectoris: Secondary | ICD-10-CM | POA: Diagnosis present

## 2021-06-25 DIAGNOSIS — Z20822 Contact with and (suspected) exposure to covid-19: Secondary | ICD-10-CM | POA: Diagnosis not present

## 2021-06-25 DIAGNOSIS — M5416 Radiculopathy, lumbar region: Secondary | ICD-10-CM | POA: Diagnosis present

## 2021-06-25 DIAGNOSIS — Z955 Presence of coronary angioplasty implant and graft: Secondary | ICD-10-CM | POA: Diagnosis not present

## 2021-06-25 DIAGNOSIS — I110 Hypertensive heart disease with heart failure: Secondary | ICD-10-CM | POA: Diagnosis present

## 2021-06-25 DIAGNOSIS — K219 Gastro-esophageal reflux disease without esophagitis: Secondary | ICD-10-CM | POA: Diagnosis present

## 2021-06-25 DIAGNOSIS — M109 Gout, unspecified: Secondary | ICD-10-CM

## 2021-06-25 DIAGNOSIS — M4800 Spinal stenosis, site unspecified: Secondary | ICD-10-CM | POA: Diagnosis present

## 2021-06-25 DIAGNOSIS — J9601 Acute respiratory failure with hypoxia: Secondary | ICD-10-CM | POA: Diagnosis present

## 2021-06-25 DIAGNOSIS — E8809 Other disorders of plasma-protein metabolism, not elsewhere classified: Secondary | ICD-10-CM | POA: Diagnosis not present

## 2021-06-25 DIAGNOSIS — L89626 Pressure-induced deep tissue damage of left heel: Secondary | ICD-10-CM | POA: Diagnosis present

## 2021-06-25 DIAGNOSIS — I252 Old myocardial infarction: Secondary | ICD-10-CM | POA: Diagnosis not present

## 2021-06-25 DIAGNOSIS — D72828 Other elevated white blood cell count: Secondary | ICD-10-CM | POA: Diagnosis not present

## 2021-06-25 DIAGNOSIS — R29898 Other symptoms and signs involving the musculoskeletal system: Secondary | ICD-10-CM | POA: Diagnosis not present

## 2021-06-25 DIAGNOSIS — I2690 Septic pulmonary embolism without acute cor pulmonale: Secondary | ICD-10-CM | POA: Diagnosis not present

## 2021-06-25 DIAGNOSIS — R739 Hyperglycemia, unspecified: Secondary | ICD-10-CM | POA: Diagnosis not present

## 2021-06-25 DIAGNOSIS — R1310 Dysphagia, unspecified: Secondary | ICD-10-CM | POA: Diagnosis present

## 2021-06-25 DIAGNOSIS — L89616 Pressure-induced deep tissue damage of right heel: Secondary | ICD-10-CM | POA: Diagnosis present

## 2021-06-25 DIAGNOSIS — R197 Diarrhea, unspecified: Secondary | ICD-10-CM | POA: Diagnosis not present

## 2021-06-25 DIAGNOSIS — R159 Full incontinence of feces: Secondary | ICD-10-CM

## 2021-06-25 DIAGNOSIS — M1000 Idiopathic gout, unspecified site: Secondary | ICD-10-CM | POA: Diagnosis not present

## 2021-06-25 DIAGNOSIS — E876 Hypokalemia: Secondary | ICD-10-CM | POA: Diagnosis not present

## 2021-06-25 DIAGNOSIS — Y92239 Unspecified place in hospital as the place of occurrence of the external cause: Secondary | ICD-10-CM | POA: Diagnosis not present

## 2021-06-25 DIAGNOSIS — B9562 Methicillin resistant Staphylococcus aureus infection as the cause of diseases classified elsewhere: Secondary | ICD-10-CM | POA: Diagnosis not present

## 2021-06-25 DIAGNOSIS — K5900 Constipation, unspecified: Secondary | ICD-10-CM

## 2021-06-25 DIAGNOSIS — A4901 Methicillin susceptible Staphylococcus aureus infection, unspecified site: Secondary | ICD-10-CM | POA: Diagnosis not present

## 2021-06-25 DIAGNOSIS — A419 Sepsis, unspecified organism: Secondary | ICD-10-CM | POA: Diagnosis not present

## 2021-06-25 DIAGNOSIS — Z8674 Personal history of sudden cardiac arrest: Secondary | ICD-10-CM | POA: Diagnosis not present

## 2021-06-25 DIAGNOSIS — Z6834 Body mass index (BMI) 34.0-34.9, adult: Secondary | ICD-10-CM | POA: Diagnosis not present

## 2021-06-25 DIAGNOSIS — J449 Chronic obstructive pulmonary disease, unspecified: Secondary | ICD-10-CM | POA: Diagnosis present

## 2021-06-25 DIAGNOSIS — Z8711 Personal history of peptic ulcer disease: Secondary | ICD-10-CM | POA: Diagnosis not present

## 2021-06-25 DIAGNOSIS — I480 Paroxysmal atrial fibrillation: Secondary | ICD-10-CM | POA: Diagnosis present

## 2021-06-25 DIAGNOSIS — R195 Other fecal abnormalities: Secondary | ICD-10-CM | POA: Diagnosis not present

## 2021-06-25 DIAGNOSIS — I10 Essential (primary) hypertension: Secondary | ICD-10-CM | POA: Diagnosis present

## 2021-06-25 DIAGNOSIS — M546 Pain in thoracic spine: Secondary | ICD-10-CM | POA: Diagnosis not present

## 2021-06-25 DIAGNOSIS — M4626 Osteomyelitis of vertebra, lumbar region: Secondary | ICD-10-CM | POA: Diagnosis present

## 2021-06-25 DIAGNOSIS — Z9981 Dependence on supplemental oxygen: Secondary | ICD-10-CM | POA: Diagnosis not present

## 2021-06-25 DIAGNOSIS — I5032 Chronic diastolic (congestive) heart failure: Secondary | ICD-10-CM | POA: Diagnosis present

## 2021-06-25 DIAGNOSIS — D638 Anemia in other chronic diseases classified elsewhere: Secondary | ICD-10-CM | POA: Diagnosis not present

## 2021-06-25 DIAGNOSIS — G894 Chronic pain syndrome: Secondary | ICD-10-CM | POA: Diagnosis present

## 2021-06-25 DIAGNOSIS — I76 Septic arterial embolism: Secondary | ICD-10-CM | POA: Diagnosis not present

## 2021-06-25 DIAGNOSIS — G8918 Other acute postprocedural pain: Secondary | ICD-10-CM | POA: Diagnosis not present

## 2021-06-25 DIAGNOSIS — E871 Hypo-osmolality and hyponatremia: Secondary | ICD-10-CM | POA: Diagnosis not present

## 2021-06-25 DIAGNOSIS — L539 Erythematous condition, unspecified: Secondary | ICD-10-CM | POA: Diagnosis not present

## 2021-06-25 DIAGNOSIS — Z6830 Body mass index (BMI) 30.0-30.9, adult: Secondary | ICD-10-CM | POA: Diagnosis not present

## 2021-06-25 DIAGNOSIS — M545 Low back pain, unspecified: Secondary | ICD-10-CM | POA: Diagnosis not present

## 2021-06-25 DIAGNOSIS — Z79899 Other long term (current) drug therapy: Secondary | ICD-10-CM | POA: Diagnosis not present

## 2021-06-25 DIAGNOSIS — E46 Unspecified protein-calorie malnutrition: Secondary | ICD-10-CM | POA: Diagnosis present

## 2021-06-25 DIAGNOSIS — R6520 Severe sepsis without septic shock: Secondary | ICD-10-CM | POA: Diagnosis present

## 2021-06-25 DIAGNOSIS — Z87891 Personal history of nicotine dependence: Secondary | ICD-10-CM | POA: Diagnosis not present

## 2021-06-25 DIAGNOSIS — D72829 Elevated white blood cell count, unspecified: Secondary | ICD-10-CM

## 2021-06-25 DIAGNOSIS — E669 Obesity, unspecified: Secondary | ICD-10-CM | POA: Diagnosis present

## 2021-06-25 DIAGNOSIS — I4891 Unspecified atrial fibrillation: Secondary | ICD-10-CM | POA: Diagnosis not present

## 2021-06-25 DIAGNOSIS — R6521 Severe sepsis with septic shock: Secondary | ICD-10-CM | POA: Diagnosis not present

## 2021-06-25 DIAGNOSIS — B9561 Methicillin susceptible Staphylococcus aureus infection as the cause of diseases classified elsewhere: Secondary | ICD-10-CM | POA: Diagnosis not present

## 2021-06-25 DIAGNOSIS — E785 Hyperlipidemia, unspecified: Secondary | ICD-10-CM | POA: Diagnosis present

## 2021-06-25 DIAGNOSIS — L89316 Pressure-induced deep tissue damage of right buttock: Secondary | ICD-10-CM | POA: Diagnosis present

## 2021-06-25 DIAGNOSIS — A4101 Sepsis due to Methicillin susceptible Staphylococcus aureus: Secondary | ICD-10-CM | POA: Diagnosis present

## 2021-06-25 DIAGNOSIS — R531 Weakness: Secondary | ICD-10-CM | POA: Diagnosis present

## 2021-06-25 DIAGNOSIS — I079 Rheumatic tricuspid valve disease, unspecified: Secondary | ICD-10-CM | POA: Diagnosis present

## 2021-06-25 LAB — BASIC METABOLIC PANEL
Anion gap: 9 (ref 5–15)
BUN: 29 mg/dL — ABNORMAL HIGH (ref 8–23)
CO2: 27 mmol/L (ref 22–32)
Calcium: 9 mg/dL (ref 8.9–10.3)
Chloride: 102 mmol/L (ref 98–111)
Creatinine, Ser: 0.92 mg/dL (ref 0.44–1.00)
GFR, Estimated: >60 mL/min (ref >60)
Glucose, Bld: 182 mg/dL — ABNORMAL HIGH (ref 70–99)
Potassium: 3.8 mmol/L (ref 3.5–5.1)
Sodium: 138 mmol/L (ref 135–145)

## 2021-06-25 LAB — CULTURE, BLOOD (ROUTINE X 2)
Culture: NO GROWTH
Culture: NO GROWTH
Special Requests: ADEQUATE

## 2021-06-25 LAB — CBC
HCT: 35 % — ABNORMAL LOW (ref 36.0–46.0)
Hemoglobin: 11.4 g/dL — ABNORMAL LOW (ref 12.0–15.0)
MCH: 28.6 pg (ref 26.0–34.0)
MCHC: 32.6 g/dL (ref 30.0–36.0)
MCV: 87.7 fL (ref 80.0–100.0)
Platelets: 419 10*3/uL — ABNORMAL HIGH (ref 150–400)
RBC: 3.99 MIL/uL (ref 3.87–5.11)
RDW: 18.1 % — ABNORMAL HIGH (ref 11.5–15.5)
WBC: 16.2 10*3/uL — ABNORMAL HIGH (ref 4.0–10.5)
nRBC: 0 % (ref 0.0–0.2)

## 2021-06-25 MED ORDER — POTASSIUM CHLORIDE CRYS ER 20 MEQ PO TBCR: 40.0000 meq | EXTENDED_RELEASE_TABLET | Freq: Two times a day (BID) | ORAL | Status: DC

## 2021-06-25 MED ORDER — ARFORMOTEROL TARTRATE 15 MCG/2ML IN NEBU: 15.0000 ug | INHALATION_SOLUTION | Freq: Two times a day (BID) | RESPIRATORY_TRACT | Status: DC

## 2021-06-25 MED ORDER — ORAL CARE MOUTH RINSE
15.0000 mL | Freq: Two times a day (BID) | OROMUCOSAL | Status: DC
  Administered 2021-06-25: 15 mL via OROMUCOSAL

## 2021-06-25 MED ORDER — LIDOCAINE 5 % EX PTCH: 1.0000 | MEDICATED_PATCH | CUTANEOUS | 0 refills | Status: DC

## 2021-06-25 MED ORDER — CLONAZEPAM 0.25 MG PO TBDP: 0.2500 mg | ORAL_TABLET | Freq: Two times a day (BID) | ORAL | 0 refills | Status: DC

## 2021-06-25 MED ORDER — DULOXETINE HCL 30 MG PO CPEP: 30.0000 mg | ORAL_CAPSULE | Freq: Every day | ORAL | 3 refills | Status: DC

## 2021-06-25 MED ORDER — CEFAZOLIN IV (FOR PTA / DISCHARGE USE ONLY): 2.0000 g | Freq: Three times a day (TID) | INTRAVENOUS | 0 refills | Status: DC

## 2021-06-25 MED ORDER — REVEFENACIN 175 MCG/3ML IN SOLN: 175.0000 ug | Freq: Every day | RESPIRATORY_TRACT | Status: DC

## 2021-06-25 MED ORDER — CHLORHEXIDINE GLUCONATE CLOTH 2 % EX PADS: 6.0000 | MEDICATED_PAD | Freq: Every day | CUTANEOUS | Status: DC

## 2021-06-25 MED ORDER — ONDANSETRON HCL 4 MG/2ML IJ SOLN: 4.0000 mg | Freq: Four times a day (QID) | INTRAMUSCULAR | 0 refills | Status: DC | PRN

## 2021-06-25 MED ORDER — GABAPENTIN 100 MG PO CAPS: 200.0000 mg | ORAL_CAPSULE | Freq: Three times a day (TID) | ORAL | Status: DC

## 2021-06-25 MED ORDER — HEPARIN SODIUM (PORCINE) 5000 UNIT/ML IJ SOLN: 5000.0000 [IU] | Freq: Three times a day (TID) | INTRAMUSCULAR | Status: DC

## 2021-06-25 MED ORDER — OXYCODONE HCL 5 MG PO TABS: 5.0000 mg | ORAL_TABLET | ORAL | 0 refills | Status: DC | PRN

## 2021-06-25 MED ORDER — SODIUM CHLORIDE 0.9% FLUSH: 10.0000 mL | INTRAVENOUS | Status: DC | PRN

## 2021-06-25 MED ORDER — KETOROLAC TROMETHAMINE 15 MG/ML IJ SOLN: 15.0000 mg | Freq: Three times a day (TID) | INTRAMUSCULAR | Status: DC

## 2021-06-25 MED ORDER — DICLOFENAC SODIUM 1 % EX GEL: 4.0000 g | Freq: Four times a day (QID) | CUTANEOUS | Status: DC

## 2021-06-25 MED ORDER — CEFAZOLIN SODIUM-DEXTROSE 2-4 GM/100ML-% IV SOLN: 2.0000 g | Freq: Three times a day (TID) | INTRAVENOUS | Status: DC

## 2021-06-25 MED ORDER — FUROSEMIDE 10 MG/ML IJ SOLN
40.0000 mg | Freq: Four times a day (QID) | INTRAMUSCULAR | Status: AC
  Administered 2021-06-25 (×2): 40 mg via INTRAVENOUS
  Filled 2021-06-25 (×2): qty 4

## 2021-06-25 MED ORDER — POTASSIUM CHLORIDE CRYS ER 20 MEQ PO TBCR
40.0000 meq | EXTENDED_RELEASE_TABLET | Freq: Two times a day (BID) | ORAL | Status: DC
  Administered 2021-06-25: 40 meq via ORAL
  Filled 2021-06-25: qty 2

## 2021-06-25 MED ORDER — SODIUM CHLORIDE 0.9 % IV SOLN: 250.0000 mL | INTRAVENOUS | 0 refills | Status: DC

## 2021-06-25 MED ORDER — SODIUM CHLORIDE 0.9% FLUSH: 10.0000 mL | Freq: Two times a day (BID) | INTRAVENOUS | Status: DC

## 2021-06-25 MED ORDER — REVEFENACIN 175 MCG/3ML IN SOLN
175.0000 ug | Freq: Every day | RESPIRATORY_TRACT | Status: DC
  Filled 2021-06-25 (×2): qty 3

## 2021-06-25 MED ORDER — METOPROLOL TARTRATE 5 MG/5ML IV SOLN: 2.5000 mg | INTRAVENOUS | Status: DC | PRN

## 2021-06-25 MED ORDER — FUROSEMIDE 10 MG/ML IJ SOLN: 40.0000 mg | Freq: Four times a day (QID) | INTRAMUSCULAR | 0 refills | Status: DC

## 2021-06-25 NOTE — Progress Notes (Signed)
Peripherally Inserted Central Catheter Placement  The IV Nurse has discussed with the patient and/or persons authorized to consent for the patient, the purpose of this procedure and the potential benefits and risks involved with this procedure.  The benefits include less needle sticks, lab draws from the catheter, and the patient may be discharged home with the catheter. Risks include, but not limited to, infection, bleeding, blood clot (thrombus formation), and puncture of an artery; nerve damage and irregular heartbeat and possibility to perform a PICC exchange if needed/ordered by physician.  Alternatives to this procedure were also discussed.  Bard Power PICC patient education guide, fact sheet on infection prevention and patient information card has been provided to patient /or left at bedside.    PICC Placement Documentation  PICC Single Lumen [id number] Right Basilic 41 cm 0 cm (Active)  Indication for Insertion or Continuance of Line Prolonged intravenous therapies 06/25/21 1528  Exposed Catheter (cm) 0 cm 06/25/21 1528  Site Assessment Clean;Dry;Intact 06/25/21 1528  Line Status Flushed;Saline locked;Blood return noted 06/25/21 1528  Dressing Type Transparent;Securing device 06/25/21 1528  Dressing Status Clean;Dry;Intact 06/25/21 1528  Antimicrobial disc in place? Yes 06/25/21 1528  Dressing Intervention New dressing;Other (Comment) 06/25/21 1528  Dressing Change Due 07/02/21 06/25/21 [place], Mariyah Upshaw 06/25/2021, 3:30 PM

## 2021-06-25 NOTE — Progress Notes (Signed)
   NAME:  Samantha King, MRN:  [id number], DOB:  [birth year], LOS: 7 ADMISSION DATE:  06/18/2021, CONSULTATION DATE:  06/20/21 REFERRING MD:  TRH, CHIEF COMPLAINT:  afib rvr and shock   History of Present Illness:  75 yo female who was in normal state of health presented 8/11 with severe back pain,  Pt was admitted found to have mssa bacteremia. CT chest showed changes of emphysema and vague nodular opacities.  CT abd/pelvis showed multiple Lt adrenal nodules.  She was started on appropriate therapy and was awaiting imaging of back. Unfortunately on 8/11 pt decompensated and req transfer to ICU 2/2 afib with rvr, hypotension on cardizem req pressors and hypoxemia req hhfnc.  Pertinent  Medical History  CAD  H/o vf arrest Hyperlipidemia Htn Hfpef COPD  Significant Hospital Events: Including procedures, antibiotic start and stop dates in addition to other pertinent events   8/10: ID consulted 2/2 MSSA bacteremia 8/11: transferred to ICU for afib rvr and septic shock, cardiology consulted as well.  Back pain 0/10 at rest but increases to 10/10 with movement.  8/12 MRI abd - benign left adrenal adenomas.  LLL pna and small effusions MRI TL spine without evidence of infection but significant chronic spinal changes.   Interim History / Subjective:  Doing much better today with new pain regimen.  Objective   Blood pressure (!) 162/78, pulse 87, temperature 98.1 F (36.7 C), temperature source Oral, resp. rate (!) 34, height '5\' 3"'$  (1.6 m), weight 84.8 kg, SpO2 93 %.    FiO2 (%):  [40 %] 40 %   Intake/Output Summary (Last 24 hours) at 06/25/2021 0932 Last data filed at 06/25/2021 0900 Gross per 24 hour  Intake 856.15 ml  Output 1465 ml  Net -608.85 ml    Filed Weights   06/18/21 2349  Weight: 84.8 kg    Examination: No distress Lungs with crackles at bases Sinus on monitor, regular rhythm, ext warm More ROM on legs and arms, still some back pain with this  BUN/Cr up  slightly WBC improved No CXR  Resolved Hospital Problem list   Afib/RVR reactive  Assessment & Plan:  Septic Shock MSSA bacteremia Severe back pain, very sensitive to fentanyl Severe hypoxemic respiratory failure due to suspected volume overload + septic emboli COPD, severe by history and imaging H/o MI with VF arrest CAD Adrenal adenomas  Tx empirically for 8 weeks with cefazolin PICC consult Encourage IS Lasix x 2 If O2 needs improved down the line and back pain is still intractable, would consider NSGY consultation and/or repeat imaging PT/OT Can change ketorolac to PRN after standing dose done Continue cymbalta, gabapentin, klonipin; titrate to effect Continue plavix, statin, beta blocker Continue nebs as ordered (brovana/yupelri) Stable for transfer to [facility] for ongoing care  Erskine Emery MD [facility] [place] epic messenger for cross cover needs If after hours, please call E-link

## 2021-06-25 NOTE — Progress Notes (Signed)
Patient declined repositioning by Rn, Rn re-educated of importance of repositioning, yet patient declines. Rn will continue to monitor.

## 2021-06-25 NOTE — Progress Notes (Signed)
PHARMACY CONSULT NOTE FOR:  OUTPATIENT  PARENTERAL ANTIBIOTIC THERAPY (OPAT)  Indication: Presumed endocarditis Regimen: Cefazolin 2 gm IV Q 8 hrs End date: 08/01/2021 (note change in end date)  IV antibiotic discharge orders are pended. To discharging provider:  please sign these orders via discharge navigator,  Select New Orders & click on the button choice - Manage This Unsigned Work.    Thank you for allowing pharmacy to participate in this patient's care.  Gillermina Hu, PharmD, BCPS, [facility] Clinical Pharmacist 06/25/2021 4:49 PM

## 2021-06-25 NOTE — Progress Notes (Signed)
Attempted to call report to [facility] RN, nurse not available; unit secretary states she will have the nurse call back to receive report.

## 2021-06-25 NOTE — Progress Notes (Addendum)
[facility] for Infectious Disease    Date of Admission:  06/18/2021   Total days of antibiotics 8/           ID: Samantha King is a 75 y.o. female with   Principal Problem:   MSSA bacteremia Active Problems:   Back pain   Septic shock ([place])   COPD with acute exacerbation ([place])   Acute hypoxemic respiratory failure (HCC)   Pulmonary nodules   Paroxysmal atrial fibrillation (HCC)   Hypoxemia    Subjective: Afebrile. Has improvement with back pain since pain management change. " I am [facility]"  Wbc trending down  Medications:   acetaminophen  650 mg Oral Q4H   arformoterol  15 mcg Nebulization BID   atorvastatin  80 mg Oral q1800   Chlorhexidine Gluconate Cloth  6 each Topical Daily   clonazepam  0.25 mg Oral BID   clopidogrel  75 mg Oral Daily   diclofenac Sodium  4 g Topical QID   DULoxetine  30 mg Oral Daily   furosemide  40 mg Intravenous Q6H   gabapentin  200 mg Oral TID   heparin injection (subcutaneous)  5,000 Units Subcutaneous Q8H   lidocaine  1 patch Transdermal Q24H   mouth rinse  15 mL Mouth Rinse BID   metoprolol tartrate  25 mg Oral BID   pantoprazole  40 mg Oral Daily   potassium chloride  40 mEq Oral BID   revefenacin  175 mcg Nebulization Daily   sodium chloride flush  10-40 mL Intracatheter Q12H    Objective: Vital signs in last 24 hours: Temp:  [97.8 F (36.6 C)-98.1 F (36.7 C)] 98 F (36.7 C) (08/16 1100) Pulse Rate:  [66-98] 68 (08/16 1400) Resp:  [13-34] 18 (08/16 1400) BP: (104-189)/(48-100) 105/48 (08/16 1400) SpO2:  [87 %-96 %] 90 % (08/16 1400) FiO2 (%):  [40 %] 40 % (08/16 1209) Physical Exam  Constitutional:  oriented to person, place, and time. appears well-developed and well-nourished. No distress.  HENT: [place]/AT, PERRLA, no scleral icterus Mouth/Throat: Oropharynx is clear and moist. No oropharyngeal exudate.  Cardiovascular: Normal rate, regular rhythm and normal heart sounds. Exam reveals  no gallop and no friction rub.  No murmur heard.  Pulmonary/Chest: Effort normal and breath sounds normal. No respiratory distress.  has no wheezes.  Neck = supple, no nuchal rigidity Abdominal: Soft. Bowel sounds are normal.  exhibits no distension. There is no tenderness.  Lymphadenopathy: no cervical adenopathy. No axillary adenopathy Neurological: alert and oriented to person, place, and time. FROM of upper extremities. Limited ROM to LE Bilaterally tdue to pain Skin: Skin is warm and dry. No rash noted. No erythema.  Psychiatric: a normal mood and affect.  behavior is normal.    Lab Results Recent Labs    06/24/21 0151 06/25/21 0112  WBC 18.7* 16.2*  HGB 10.7* 11.4*  HCT 33.3* 35.0*  NA 137 138  K 4.0 3.8  CL 102 102  CO2 25 27  BUN 27* 29*  CREATININE 0.85 0.92   Lab Results  Component Value Date   ESRSEDRATE 64 (H) 06/20/2021     Microbiology: 8/11- blood cx NGTD 8/9 blood cx MSSA Studies/Results: DG Chest 1 View  Result Date: 06/24/2021 CLINICAL DATA:  Pneumonia, shortness of breath EXAM: CHEST  1 VIEW COMPARISON:  June 22, 2021 FINDINGS: Widened vascular pedicle. No large pleural effusion. No pneumothorax. Again seen are bilateral nodular and reticular opacities which have increased in  prominence. No acute osseous abnormality. IMPRESSION: Worsening bilateral pulmonary opacities.  No large effusion. Electronically Signed   By: Albin Felling M.D.   On: 06/24/2021 09:25   [place] EKG SITE RITE  Result Date: 06/25/2021 If Site Rite image not attached, placement could not be confirmed due to current cardiac rhythm.    Assessment/Plan: Complicated MSSA bacteremia with probably endocarditis with pulmonary septic emboli = continue on cefazolin 2gm IV Q 8hr for 6 wk. We will follow up at that time to decide whether needs further abtx. She only had TTE done, which did not show vegetation, presumed endocarditis due to numerous pulmonary opacities.  Back pain = slightly  improved with change in regimen. Acute worsening of back pain in the setting of MSSA bacteremia is concerning for discitis, however, imaging did not show changes. 6 wk IV cefazolin would also treat discitis. If back pain worsens, would be worth repeat imaging in a couple weeks.  Will see back in the ID clinic in 4-6 wk ---------------- Diagnosis: Presumed endocarditis  Culture Result: MSSA  Allergies  Allergen Reactions   Shellfish Allergy Nausea And Vomiting   Codeine Other (See Comments)    Makes me hyperactive and not able to sleep   Lidocaine Palpitations    OPAT Orders Discharge antibiotics to be given via PICC line Discharge antibiotics: Per pharmacy protocol cefazolin 2gm iv Q 8hr  Duration: 6 wk End Date: 08/01/21  [facility] Care Per Protocol:  Home health RN for IV administration and teaching; PICC line care and labs.    Labs weekly while on IV antibiotics: _x_ CBC with differential __x BMP   __x Please pull PIC at completion of IV antibiotics   Fax weekly labs to [phone]  Clinic Follow Up Appt: In 4-6 wk   @ [facility], [facility] for Infectious Diseases Cell: [phone] Pager: [phone]  06/25/2021, 4:18 PM

## 2021-06-25 NOTE — Progress Notes (Signed)
PHARMACY CONSULT NOTE FOR:  OUTPATIENT  PARENTERAL ANTIBIOTIC THERAPY (OPAT)  Indication: MSSA bacteremia Regimen: Cefazolin 2 grams IV push every 8 hours End date: 08/16/2021  IV antibiotic discharge orders are pended. To discharging provider:  please sign these orders via discharge navigator,  Select New Orders & click on the button choice - Manage This Unsigned Work.    Thank you for allowing pharmacy to participate in this patient's care.  Reatha Harps, PharmD PGY1 Pharmacy Resident 06/25/2021 11:30 AM Check [email or web address] for unit specific pharmacy number

## 2021-06-25 NOTE — Discharge Summary (Addendum)
Physician Discharge Summary     Patient ID: Samantha King MRN: [id number] DOB/AGE: 02/21/1946 75 y.o.  Admission Date: 06/18/2021 Discharge Date: 06/25/2021  Discharge Diagnoses:  Principal Problem:   MSSA bacteremia Active Problems:   Back pain   Septic shock (HCC)   COPD with acute exacerbation (HCC)   Acute hypoxemic respiratory failure (HCC)   Pulmonary nodules   Paroxysmal atrial fibrillation (HCC)   Hypoxemia  History of Present Illness: Samantha King is a 75 year old woman who was in her normal state of health until she presented to [facility] 8/11 with severe back pain. Admitted, found to have MSSA bacteremia. Hospital course c/b respiratory decompensation and Afib with RVR.  Hospital Course:  Samantha King presented to [facility] 8/11 with severe back pain. She was admitted and found to have MSSA bacteremia. CT Chest demonstrated changes consistent with emphysema and vague nodular opacities. CT Abd/Pelvis demonstrated multiple adrenal nodules.  She was started on appropriate therapy and was awaiting spinal imaging. Unfortunately, 8/11 patient decompensated and required transfer to ICU for Afib with RVR with resultant hypotension. She required Cardizem gtt, amiodarone load/gtt and pressors for hemodynamic instability as well as HHFNC for respiratory support. She was anticoagulated with heparin gtt. Ultimately, patient converted back to NSR and amiodarone/heparin were discontinued. Respiratory status continues to slowly improve with decreased supplemental O2 needs.   On 8/16, patient was deemed medically fit for transfer to [place] for continued weaning of supplemental O2 and prolonged course of antibiotic therapy for MSSA bacteremia. PICC to be placed for stable long-term intravenous access and eventual home intravenous access, if needed.  Discharge Plan by Active Problems:  Septic Shock secondary to MSSA bacteremia TTE negative for vegetation. Strep gallolyticus  was a lab error. No role for TEE at present, per ID. - Appreciate Infectious Disease recommendations - Continue Ancef - Prolonged course (likely ~8 weeks) in the setting of bacteremia - PICC placement - Outpatient Home Parenteral Antibiotic Therapy Consult   Acute hypoxic respiratory failure Likely due to cardiogenic pulmonary edema vs. septic emboli - Continue supplemental O2 support, remains on HHFNC - Wean O2 for sat 88-92% - Aggressive pulmonary hygiene - Encourage mobility - PT/OT as able  COPD - Continue bronchodilators   Afib with RVR (resolved) Now back in NSR with improved hemodynamic stability. - Appreciate Cardiology recommendations - Amiodarone and heparin gtt discontinued - Continue metoprolol BID   AKI Likely multifactorial in the setting of sepsis, medication administration (Lasix, NSAIDs) and non-obstructing renal stone on admission CT.  - Trend BMP - Replete electrolytes as indicated - Monitor I&Os - F/u urine studies - Avoid nephrotoxic agents as able - Ensure adequate renal perfusion   History of HFpEF - Assess diuresis needs daily - Monitor I&Os - Cardiac monitoring   History of MI with VF arrest CAD Hyperlipidemia - Continue Plavix - Continue statin  History of HTN - Holding home medications at present - Continue metoprolol    Pulmonary nodules - Outpatient follow up/surveillance   Acute on chronic lumbar back pain in the setting of MSSA bacteria Severe degenerative joint changes and canal stenosis on CT & MRI, 45m nonobstructive left renal calculus. Confirmed on MRI. - Recommend NSGY reevaluation for any possible interventions (palliating or otherwise) once respiratory status improved/O2 requirement reduced - Repeat spinal imaging per NSGY - Oxycodone PRN - Maximize adjunct medications including APAP, gabapentin, Cymbalta, Toradol PRN  - Topical pain relief as able with lido patch, diclofenac   Nausea  Seems to be  improving,  intermittent only. - Continue Zofran PRN   Adrenal nodules CT on admission showed left adrenal nodules measuring up 58m, confirmed on MRI. - Surveillance monitoring per protocol  Significant Hospital Tests/Studies:  8/9 CT L-Spine IMPRESSION: 1. Transitional lumbosacral anatomy, as detailed above. 2. Grade 1 anterolisthesis of L4 on L5 with right L4 pars defect and severe degenerative change with potentially severe canal stenosis and at least moderate right foraminal stenosis at this level. 3. Dextrocurvature centered at L2-L3 with severe multilevel degenerative change and at least moderate left foraminal stenosis at L2-L3 and L3-L4. 4. Given the above findings, recommend an MRI of the lumbar spine to better evaluate the canal and foramina. 5. No evidence of acute fracture or clearly traumatic malalignment  8/9 CT Angio C/A/P IMPRESSION: 1. No evidence for thoracoabdominal aortic aneurysm or dissection. No acute intramural hematoma of the thoracic aorta. 2. No large central pulmonary embolus. 3. Multiple bilateral pulmonary nodules measuring up to 2 cm. These larger nodules may reflect infectious/inflammatory etiology. Follow-up CT chest in 3 months after therapy recommended. 4. Moderate narrowing at the ostium of the SMA. 5. Multiple left adrenal nodules measuring up to 16 mm in size, although attenuation is too high to allow definitive characterization as adenomas. This could be reassessed at the time of follow-up chest CT or abdominal MRI could be used for subsequent more definitive characterization as warranted. 6. 2 mm nonobstructing left renal stone. 7. Left colonic diverticulosis without diverticulitis. 8. Aortic Atherosclerosis (ICD10-I70.0) and Emphysema (ICD10-J43.9).  8/12 MR Lumbar Spine L5 is sacralized.   Thoracolumbar curvature convex to the left and lower lumbar curvature convex to the right. Anterolisthesis at L4-5 of 5 mm.   Grossly non-compressive  degenerative changes at L2-3 and above.   L3-4: Moderate multifactorial stenosis of the canal. Foraminal stenosis left worse than right. Neural compression could occur at this level, more likely on the left.   L4-5: Severe multifactorial spinal stenosis. Foraminal stenosis right worse than left. Neural compression could occur on either or both sides at this level, and within the foramen on the right.  8/12 MR Abdomen IMPRESSION: Motion degraded images.   Two benign left adrenal adenomas.   Left lower lobe pneumonia.  Small bilateral pleural effusions.  8/12 MR Thoracic Spine IMPRESSION: Thoracic scoliotic curvature. No significant finding at T11-12 or above. Right-sided predominant disc degeneration at T12-L1, but without apparent neural compression. Findings at this level could relate to back pain.   No finding in the thoracic region to suggest spinal infection.  8/15 CXR IMPRESSION: Worsening bilateral pulmonary opacities.  No large effusion   Consults: Cardiology, Infectious Disease  Discharge Exam: BP (!) 105/48   Pulse 68   Temp 98 F (36.7 C) (Oral)   Resp 18   Ht _0  (1.6 m)   Wt 84.8 kg   SpO2 90%   BMI 33.12 kg/m   General: Chronically ill-appearing elderly woman in NAD. HEENT: [place]/AT, anicteric sclera, PERRL, moist mucous membranes. Neuro: Awake, oriented x 4. Responds to verbal stimuli. Following commands consistently. Moves all 4 extremities spontaneously. Strength 3-4/5 in all 4 extremities. CV: RRR, no m/g/r. Occasional PACs on telemetry. PULM: Breathing even and minimally labored on HHFNC (20L, FiO2 40%). Lung fields diminished at bilateral bases, occasional expiratory wheeze. GI: Soft, nontender, nondistended. Normoactive bowel sounds. Extremities: Bilateral trace LE edema noted. Skin: Warm/dry, no significant rashes.  Labs at Discharge: Lab Results  Component Value Date   CREATININE 0.92 06/25/2021   BUN 29 (H)  06/25/2021   NA 138  06/25/2021   K 3.8 06/25/2021   CL 102 06/25/2021   CO2 27 06/25/2021   Lab Results  Component Value Date   WBC 16.2 (H) 06/25/2021   HGB 11.4 (L) 06/25/2021   HCT 35.0 (L) 06/25/2021   MCV 87.7 06/25/2021   PLT 419 (H) 06/25/2021   Lab Results  Component Value Date   ALT 27 03/10/2020   AST 24 03/10/2020   ALKPHOS 51 03/10/2020   BILITOT 0.9 03/10/2020   Lab Results  Component Value Date   INR 1.0 03/10/2020   INR 1.03 10/29/2012   Current Radiology Studies: DG Chest 1 View  Result Date: 06/24/2021 CLINICAL DATA:  Pneumonia, shortness of breath EXAM: CHEST  1 VIEW COMPARISON:  June 22, 2021 FINDINGS: Widened vascular pedicle. No large pleural effusion. No pneumothorax. Again seen are bilateral nodular and reticular opacities which have increased in prominence. No acute osseous abnormality. IMPRESSION: Worsening bilateral pulmonary opacities.  No large effusion. Electronically Signed   By: Albin Felling M.D.   On: 06/24/2021 09:25   [place] EKG SITE RITE  Result Date: 06/25/2021 If Site Rite image not attached, placement could not be confirmed due to current cardiac rhythm.   Disposition: To [facility]  Discharge disposition: 02-Transferred to [facility]     Allergies as of 06/25/2021       Reactions   Shellfish Allergy Nausea And Vomiting   Codeine Other (See Comments)   Makes me hyperactive and not able to sleep   Lidocaine Palpitations        Medication List     STOP taking these medications    furosemide 40 MG tablet Commonly known as: LASIX Replaced by: furosemide 10 MG/ML injection   Krill Oil 500 MG Caps   lisinopril 5 MG tablet Commonly known as: ZESTRIL   nitroGLYCERIN 0.4 MG SL tablet Commonly known as: NITROSTAT   ProAir RespiClick 016 (90 Base) MCG/ACT Aepb Generic drug: Albuterol Sulfate   PROBIOTIC PO   psyllium 0.52 g capsule Commonly known as: REGULOID   Symbicort 160-4.5 MCG/ACT inhaler Generic drug:  budesonide-formoterol       TAKE these medications    acetaminophen 325 MG tablet Commonly known as: TYLENOL Take 650 mg by mouth every 6 (six) hours as needed for mild pain or headache. For headache or pain   arformoterol 15 MCG/2ML Nebu Commonly known as: BROVANA Take 2 mLs (15 mcg total) by nebulization 2 (two) times daily.   atorvastatin 80 MG tablet Commonly known as: LIPITOR Take 1 tablet (80 mg total) by mouth daily at 6 PM.   ceFAZolin  IVPB Commonly known as: ANCEF Inject 2 g into the vein every 8 (eight) hours. Indication:  Presumed endocarditis First Dose: No Last Day of Therapy:  08/01/21 Labs - Once weekly:  CBC/D and BMP, Labs - Every other week:  ESR and CRP Method of administration: IV Push Method of administration may be changed at the discretion of home infusion pharmacist based upon assessment of the patient and/or caregiver's ability to self-administer the medication ordered.   Chlorhexidine Gluconate Cloth 2 % Pads Apply 6 each topically daily. Start taking on: June 26, 2021   clonazePAM 0.25 MG disintegrating tablet Commonly known as: KLONOPIN Take 1 tablet (0.25 mg total) by mouth 2 (two) times daily.   clopidogrel 75 MG tablet Commonly known as: PLAVIX Take 1 tablet (75 mg total) by mouth daily.   diclofenac Sodium 1 % Gel  Commonly known as: VOLTAREN Apply 4 g topically 4 (four) times daily.   DULoxetine 30 MG capsule Commonly known as: CYMBALTA Take 1 capsule (30 mg total) by mouth daily. Start taking on: June 26, 2021   escitalopram 5 MG tablet Commonly known as: LEXAPRO Take 5 mg by mouth daily.   furosemide 10 MG/ML injection Commonly known as: LASIX Inject 4 mLs (40 mg total) into the vein every 6 (six) hours. Replaces: furosemide 40 MG tablet   gabapentin 100 MG capsule Commonly known as: NEURONTIN Take 2 capsules (200 mg total) by mouth 3 (three) times daily.   heparin 5000 UNIT/ML injection Inject 1 mL (5,000 Units  total) into the skin every 8 (eight) hours.   ketorolac 15 MG/ML injection Commonly known as: TORADOL Inject 1 mL (15 mg total) into the vein every 8 (eight) hours.   lidocaine 5 % Commonly known as: LIDODERM Place 1 patch onto the skin daily. Remove & Discard patch within 12 hours or as directed by MD Start taking on: June 26, 2021   metoprolol tartrate 25 MG tablet Commonly known as: LOPRESSOR Take 1 tablet (25 mg total) by mouth 2 (two) times daily. What changed: Another medication with the same name was added. Make sure you understand how and when to take each.   metoprolol tartrate 5 MG/5ML Soln injection Commonly known as: LOPRESSOR Inject 2.5-5 mLs (2.5-5 mg total) into the vein every 3 (three) hours as needed (HR 120 & above). What changed: You were already taking a medication with the same name, and this prescription was added. Make sure you understand how and when to take each.   ondansetron 4 MG/2ML Soln injection Commonly known as: ZOFRAN Inject 2 mLs (4 mg total) into the vein every 6 (six) hours as needed for nausea or vomiting.   oxyCODONE 5 MG immediate release tablet Commonly known as: Oxy IR/ROXICODONE Take 1 tablet (5 mg total) by mouth every 4 (four) hours as needed for moderate pain.   pantoprazole 40 MG tablet Commonly known as: PROTONIX Take 1 tablet (40 mg total) by mouth daily.   potassium chloride SA 20 MEQ tablet Commonly known as: KLOR-CON Take 2 tablets (40 mEq total) by mouth 2 (two) times daily.   revefenacin 175 MCG/3ML nebulizer solution Commonly known as: YUPELRI Take 3 mLs (175 mcg total) by nebulization daily.   sodium chloride 0.9 % infusion Inject 250 mLs into the vein continuous.   sodium chloride flush 0.9 % Soln Commonly known as: NS 10-40 mLs by Intracatheter route every 12 (twelve) hours.   sodium chloride flush 0.9 % Soln Commonly known as: NS 10-40 mLs by Intracatheter route as needed (flush).                Discharge Care Instructions  (From admission, onward)           Start     Ordered   06/25/21 0000  Change dressing on IV access line weekly and PRN  (Home infusion instructions - [facility] )        06/25/21 1635   06/25/21 0000  Change dressing on IV access line weekly and PRN  (Home infusion instructions - [facility] )        06/25/21 1709           Discharged Condition: stable  55 minutes of time have been dedicated to discharge assessment, planning and discharge instructions.   Signed:  Lestine Mount, PA-C [facility] 06/25/21 5:09 PM  Please see [email or web address] for pager details.  From 7A-7P if no response, please call [phone] After hours, please call ELink [phone]

## 2021-06-25 NOTE — Progress Notes (Addendum)
Report given to Eston Esters, RN at [facility]; pt to be transferred via bed with Oxygen to [facility]; Dr. Laren Everts is the receiving MD; Davy Pique will call back when the room when room is cleaned.

## 2021-06-25 NOTE — TOC Transition Note (Addendum)
Transition of Care [facility]) - CM/SW Discharge Note   Patient Details  Name: Samantha King MRN: [id number] Date of Birth: Feb 01, 1946  [facility] CM/SW Contact:  Tom-Johnson, Renea Ee, RN Phone Number: 06/25/2021, 1:49 PM   Clinical Narrative:    Family accepted bed at [facility] and patient will be going to room 18. Nurse to call report to [phone] and transport patient. No further TOC needs at this time.   Final next level of care: Long Term Acute Care (LTAC) Barriers to Discharge: No Barriers Identified   Patient Goals and CMS Choice   CMS Medicare.gov Compare Post Acute Care list provided to:: Patient Represenative (must comment) (Spouse) Choice offered to / list presented to : Spouse  Discharge Placement                       Discharge Plan and Services   Discharge Planning Services: CM Consult Post Acute Care Choice: Long Term Acute Care (LTAC)                               Social Determinants of Health (SDOH) Interventions     Readmission Risk Interventions No flowsheet data found.

## 2021-06-25 NOTE — Progress Notes (Signed)
Pt transferred via bed with oxygen to [facility] room 18; pt stable at transfer; pt's husband at bedside and has her  belongings

## 2021-06-26 ENCOUNTER — Telehealth: Payer: Self-pay

## 2021-06-26 LAB — CBC
HCT: 36.2 % (ref 36.0–46.0)
Hemoglobin: 11.8 g/dL — ABNORMAL LOW (ref 12.0–15.0)
MCH: 28.9 pg (ref 26.0–34.0)
MCHC: 32.6 g/dL (ref 30.0–36.0)
MCV: 88.5 fL (ref 80.0–100.0)
Platelets: 497 10*3/uL — ABNORMAL HIGH (ref 150–400)
RBC: 4.09 MIL/uL (ref 3.87–5.11)
RDW: 18 % — ABNORMAL HIGH (ref 11.5–15.5)
WBC: 19.6 10*3/uL — ABNORMAL HIGH (ref 4.0–10.5)
nRBC: 0 % (ref 0.0–0.2)

## 2021-06-26 LAB — URINALYSIS, ROUTINE W REFLEX MICROSCOPIC
Bilirubin Urine: NEGATIVE
Glucose, UA: NEGATIVE mg/dL
Ketones, ur: NEGATIVE mg/dL
Leukocytes,Ua: NEGATIVE
Nitrite: NEGATIVE
Protein, ur: NEGATIVE mg/dL
RBC / HPF: >50 RBC/hpf — ABNORMAL HIGH (ref 0–5)
Specific Gravity, Urine: 1.009 (ref 1.005–1.030)
pH: 7 (ref 5.0–8.0)

## 2021-06-26 LAB — BASIC METABOLIC PANEL
Anion gap: 9 (ref 5–15)
BUN: 24 mg/dL — ABNORMAL HIGH (ref 8–23)
CO2: 29 mmol/L (ref 22–32)
Calcium: 8.7 mg/dL — ABNORMAL LOW (ref 8.9–10.3)
Chloride: 97 mmol/L — ABNORMAL LOW (ref 98–111)
Creatinine, Ser: 0.75 mg/dL (ref 0.44–1.00)
GFR, Estimated: >60 mL/min (ref >60)
Glucose, Bld: 99 mg/dL (ref 70–99)
Potassium: 4.8 mmol/L (ref 3.5–5.1)
Sodium: 135 mmol/L (ref 135–145)

## 2021-06-26 NOTE — Telephone Encounter (Signed)
Called to get patient an appointment scheduled in 4/6 weeks with Dr. West Bali, left voicemail for patient to call back and get scheduled

## 2021-06-27 ENCOUNTER — Telehealth: Payer: Self-pay

## 2021-06-27 ENCOUNTER — Other Ambulatory Visit (HOSPITAL_COMMUNITY): Payer: Medicare Other

## 2021-06-27 LAB — BASIC METABOLIC PANEL
Anion gap: 12 (ref 5–15)
BUN: 22 mg/dL (ref 8–23)
CO2: 29 mmol/L (ref 22–32)
Calcium: 9.5 mg/dL (ref 8.9–10.3)
Chloride: 94 mmol/L — ABNORMAL LOW (ref 98–111)
Creatinine, Ser: 0.75 mg/dL (ref 0.44–1.00)
GFR, Estimated: >60 mL/min (ref >60)
Glucose, Bld: 96 mg/dL (ref 70–99)
Potassium: 5.2 mmol/L — ABNORMAL HIGH (ref 3.5–5.1)
Sodium: 135 mmol/L (ref 135–145)

## 2021-06-27 LAB — URINE CULTURE: Culture: NO GROWTH

## 2021-06-27 LAB — POTASSIUM: Potassium: 5 mmol/L (ref 3.5–5.1)

## 2021-06-27 IMAGING — DX DG CHEST 1V PORT
1 series · 1 of 1 positions shown · non-contrast
Comparison: Chest x-ray dated [DATE].

CLINICAL DATA: Leukocytosis.

EXAM:
PORTABLE CHEST 1 VIEW

[chest]
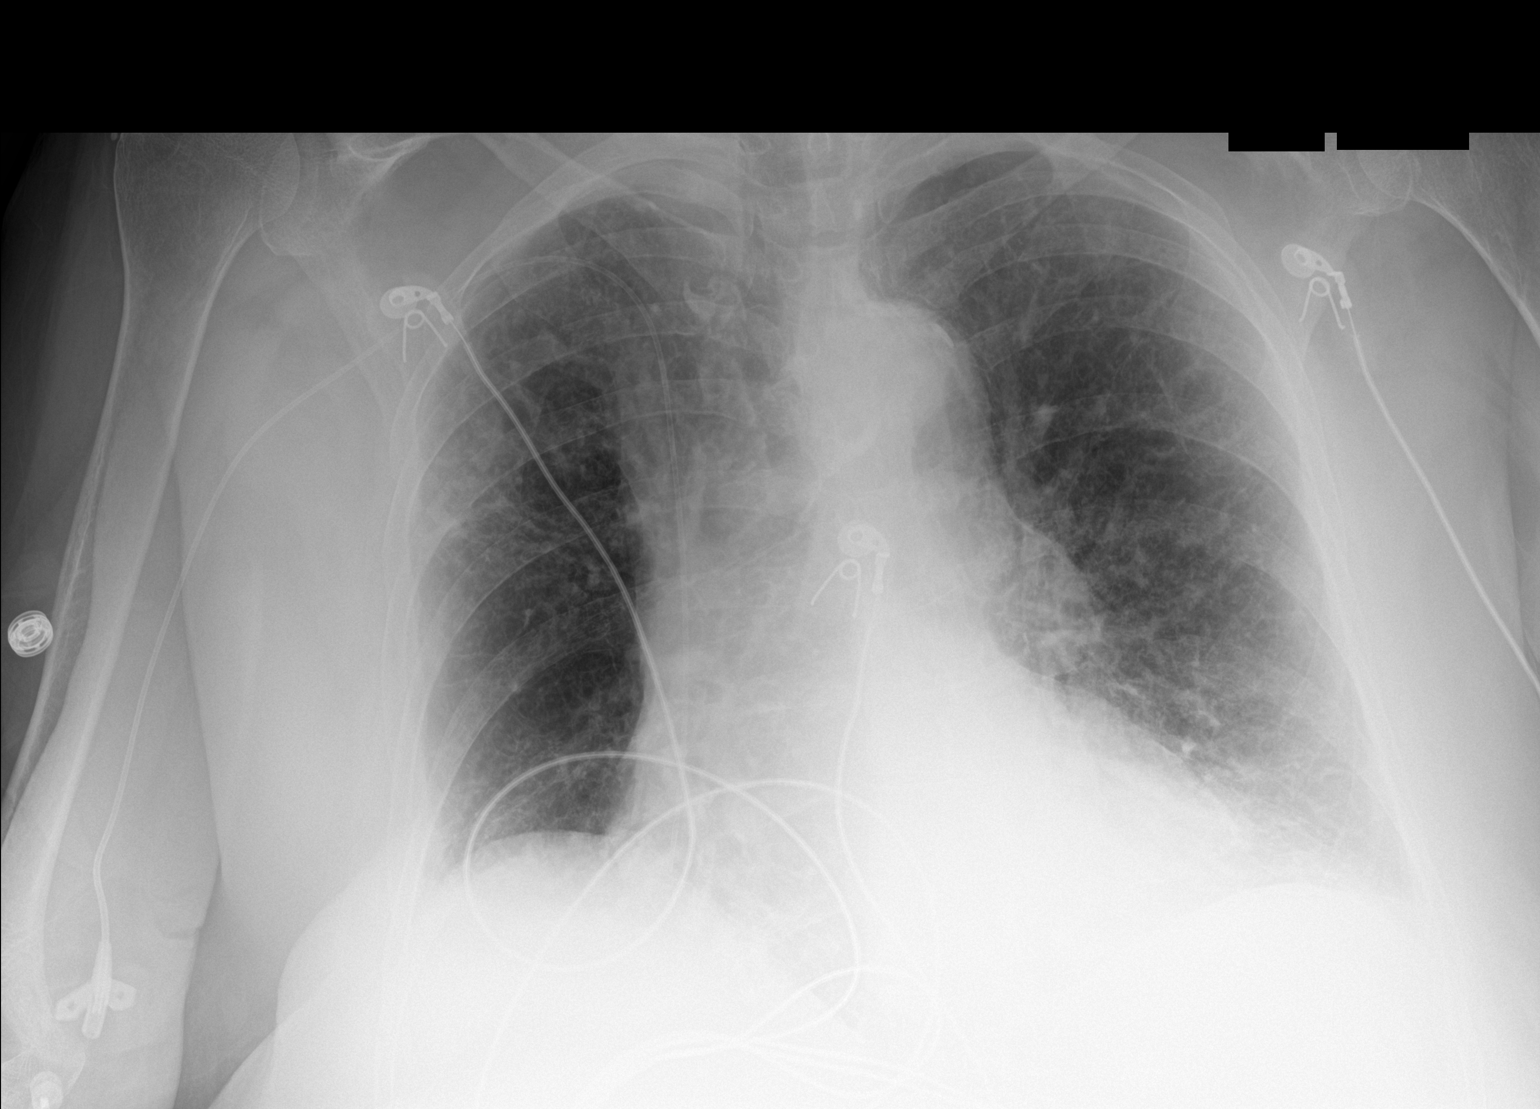

[1 of 1 positions shown; findings below may reference images not displayed]

FINDINGS: New right upper extremity PICC line with tip in the cavoatrial
junction. Normal heart size. Improving interstitial opacities. No
pleural effusion or pneumothorax. No acute osseous abnormality.
IMPRESSION: 1. Improving interstitial opacities which may reflect resolving
edema or infection.

## 2021-06-27 NOTE — Telephone Encounter (Signed)
-----   Message from Beryle Flock, RN sent at 06/25/2021  4:43 PM EDT -----  ----- Message ----- From: Carlyle Basques, MD Sent: 06/25/2021   4:37 PM EDT To: [facility] Triage Nurse Pool  Can she be seen by Collene Mares in 4-6 wk

## 2021-06-27 NOTE — Telephone Encounter (Signed)
Left patient a voice mail to call back to schedule a hospital follow with Dr. West Bali with in 4 to 6 week (around 08/06/21) by Dr. Baxter Flattery

## 2021-06-28 ENCOUNTER — Other Ambulatory Visit (HOSPITAL_COMMUNITY): Payer: Medicare Other

## 2021-06-28 LAB — CBC
HCT: 35.7 % — ABNORMAL LOW (ref 36.0–46.0)
Hemoglobin: 11.3 g/dL — ABNORMAL LOW (ref 12.0–15.0)
MCH: 28.5 pg (ref 26.0–34.0)
MCHC: 31.7 g/dL (ref 30.0–36.0)
MCV: 90.2 fL (ref 80.0–100.0)
Platelets: 436 10*3/uL — ABNORMAL HIGH (ref 150–400)
RBC: 3.96 MIL/uL (ref 3.87–5.11)
RDW: 17.2 % — ABNORMAL HIGH (ref 11.5–15.5)
WBC: 20.5 10*3/uL — ABNORMAL HIGH (ref 4.0–10.5)
nRBC: 0 % (ref 0.0–0.2)

## 2021-06-28 LAB — BASIC METABOLIC PANEL
Anion gap: 9 (ref 5–15)
BUN: 20 mg/dL (ref 8–23)
CO2: 31 mmol/L (ref 22–32)
Calcium: 9 mg/dL (ref 8.9–10.3)
Chloride: 95 mmol/L — ABNORMAL LOW (ref 98–111)
Creatinine, Ser: 0.76 mg/dL (ref 0.44–1.00)
GFR, Estimated: >60 mL/min (ref >60)
Glucose, Bld: 108 mg/dL — ABNORMAL HIGH (ref 70–99)
Potassium: 5.3 mmol/L — ABNORMAL HIGH (ref 3.5–5.1)
Sodium: 135 mmol/L (ref 135–145)

## 2021-06-28 LAB — MAGNESIUM: Magnesium: 1.8 mg/dL (ref 1.7–2.4)

## 2021-06-28 LAB — PHOSPHORUS: Phosphorus: 3.8 mg/dL (ref 2.5–4.6)

## 2021-06-28 IMAGING — DX DG ABD PORTABLE 1V
1 series · 1 of 1 positions shown · non-contrast
Comparison: None.

CLINICAL DATA: Constipation

EXAM:
PORTABLE ABDOMEN - 1 VIEW

[abdomen kub]
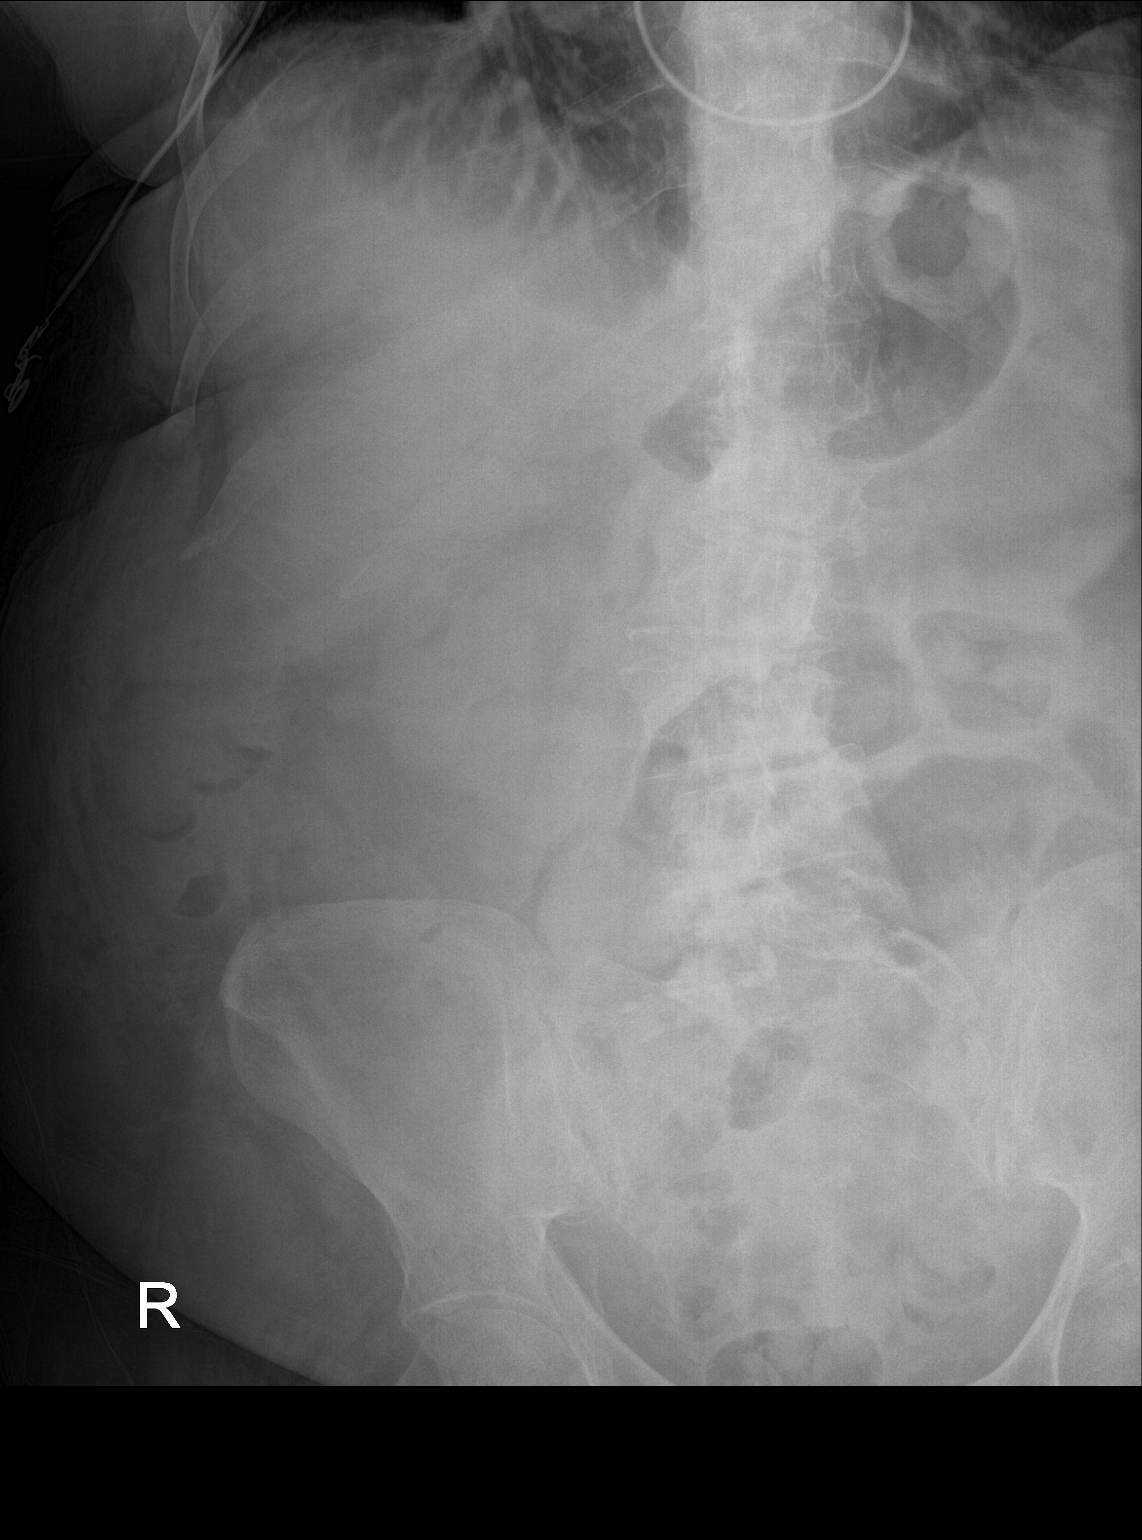

[1 of 1 positions shown; findings below may reference images not displayed]

FINDINGS: There is a nonobstructive bowel gas pattern with a relative paucity
of bowel gas in the right hemiabdomen. The left hemiabdomen is not
entirely imaged.

There is no gross organomegaly or abnormal soft tissue
calcification.

There is dextrocurvature of the lumbar spine. The SI joints and
symphysis pubis appear intact.
IMPRESSION: Nonobstructive bowel gas pattern with relative paucity of bowel gas
in the right hemiabdomen.

## 2021-06-29 LAB — URINALYSIS, ROUTINE W REFLEX MICROSCOPIC
Bacteria, UA: NONE SEEN
Bilirubin Urine: NEGATIVE
Glucose, UA: NEGATIVE mg/dL
Ketones, ur: NEGATIVE mg/dL
Leukocytes,Ua: NEGATIVE
Nitrite: NEGATIVE
Protein, ur: NEGATIVE mg/dL
Specific Gravity, Urine: 1.014 (ref 1.005–1.030)
pH: 5 (ref 5.0–8.0)

## 2021-06-29 LAB — CBC
HCT: 35.6 % — ABNORMAL LOW (ref 36.0–46.0)
Hemoglobin: 11.6 g/dL — ABNORMAL LOW (ref 12.0–15.0)
MCH: 28.9 pg (ref 26.0–34.0)
MCHC: 32.6 g/dL (ref 30.0–36.0)
MCV: 88.8 fL (ref 80.0–100.0)
Platelets: 402 10*3/uL — ABNORMAL HIGH (ref 150–400)
RBC: 4.01 MIL/uL (ref 3.87–5.11)
RDW: 17.2 % — ABNORMAL HIGH (ref 11.5–15.5)
WBC: 19.6 10*3/uL — ABNORMAL HIGH (ref 4.0–10.5)
nRBC: 0 % (ref 0.0–0.2)

## 2021-06-29 LAB — BASIC METABOLIC PANEL
Anion gap: 10 (ref 5–15)
BUN: 15 mg/dL (ref 8–23)
CO2: 32 mmol/L (ref 22–32)
Calcium: 9.4 mg/dL (ref 8.9–10.3)
Chloride: 95 mmol/L — ABNORMAL LOW (ref 98–111)
Creatinine, Ser: 0.7 mg/dL (ref 0.44–1.00)
GFR, Estimated: >60 mL/min (ref >60)
Glucose, Bld: 102 mg/dL — ABNORMAL HIGH (ref 70–99)
Potassium: 4.9 mmol/L (ref 3.5–5.1)
Sodium: 137 mmol/L (ref 135–145)

## 2021-06-29 LAB — SEDIMENTATION RATE: Sed Rate: 108 mm/hr — ABNORMAL HIGH (ref 0–22)

## 2021-06-29 LAB — MAGNESIUM: Magnesium: 1.9 mg/dL (ref 1.7–2.4)

## 2021-06-29 LAB — PROCALCITONIN: Procalcitonin: 0.37 ng/mL

## 2021-06-29 LAB — C-REACTIVE PROTEIN: CRP: 18.5 mg/dL — ABNORMAL HIGH (ref <1.0)

## 2021-06-29 LAB — PHOSPHORUS: Phosphorus: 4.2 mg/dL (ref 2.5–4.6)

## 2021-06-30 LAB — URINE CULTURE: Culture: NO GROWTH

## 2021-06-30 LAB — BASIC METABOLIC PANEL
Anion gap: 7 (ref 5–15)
BUN: 19 mg/dL (ref 8–23)
CO2: 33 mmol/L — ABNORMAL HIGH (ref 22–32)
Calcium: 9.1 mg/dL (ref 8.9–10.3)
Chloride: 95 mmol/L — ABNORMAL LOW (ref 98–111)
Creatinine, Ser: 0.69 mg/dL (ref 0.44–1.00)
GFR, Estimated: >60 mL/min (ref >60)
Glucose, Bld: 105 mg/dL — ABNORMAL HIGH (ref 70–99)
Potassium: 3.7 mmol/L (ref 3.5–5.1)
Sodium: 135 mmol/L (ref 135–145)

## 2021-06-30 LAB — PHOSPHORUS: Phosphorus: 3.2 mg/dL (ref 2.5–4.6)

## 2021-06-30 LAB — CBC
HCT: 34.6 % — ABNORMAL LOW (ref 36.0–46.0)
Hemoglobin: 10.9 g/dL — ABNORMAL LOW (ref 12.0–15.0)
MCH: 28.2 pg (ref 26.0–34.0)
MCHC: 31.5 g/dL (ref 30.0–36.0)
MCV: 89.4 fL (ref 80.0–100.0)
Platelets: 419 10*3/uL — ABNORMAL HIGH (ref 150–400)
RBC: 3.87 MIL/uL (ref 3.87–5.11)
RDW: 17 % — ABNORMAL HIGH (ref 11.5–15.5)
WBC: 15.7 10*3/uL — ABNORMAL HIGH (ref 4.0–10.5)
nRBC: 0 % (ref 0.0–0.2)

## 2021-06-30 LAB — MAGNESIUM: Magnesium: 1.7 mg/dL (ref 1.7–2.4)
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
## 2021-07-01 LAB — BASIC METABOLIC PANEL
Anion gap: 10 (ref 5–15)
BUN: 12 mg/dL (ref 8–23)
CO2: 27 mmol/L (ref 22–32)
Calcium: 9 mg/dL (ref 8.9–10.3)
Chloride: 95 mmol/L — ABNORMAL LOW (ref 98–111)
Creatinine, Ser: 0.64 mg/dL (ref 0.44–1.00)
GFR, Estimated: >60 mL/min (ref >60)
Glucose, Bld: 97 mg/dL (ref 70–99)
Potassium: 4 mmol/L (ref 3.5–5.1)
Sodium: 132 mmol/L — ABNORMAL LOW (ref 135–145)

## 2021-07-01 LAB — CBC
HCT: 35.6 % — ABNORMAL LOW (ref 36.0–46.0)
Hemoglobin: 11.1 g/dL — ABNORMAL LOW (ref 12.0–15.0)
MCH: 28.1 pg (ref 26.0–34.0)
MCHC: 31.2 g/dL (ref 30.0–36.0)
MCV: 90.1 fL (ref 80.0–100.0)
Platelets: 442 10*3/uL — ABNORMAL HIGH (ref 150–400)
RBC: 3.95 MIL/uL (ref 3.87–5.11)
RDW: 16.8 % — ABNORMAL HIGH (ref 11.5–15.5)
WBC: 17.1 10*3/uL — ABNORMAL HIGH (ref 4.0–10.5)
nRBC: 0 % (ref 0.0–0.2)

## 2021-07-01 LAB — MAGNESIUM: Magnesium: 2 mg/dL (ref 1.7–2.4)

## 2021-07-01 LAB — PHOSPHORUS: Phosphorus: 2.6 mg/dL (ref 2.5–4.6)

## 2021-07-03 ENCOUNTER — Other Ambulatory Visit (HOSPITAL_COMMUNITY): Payer: Medicare Other

## 2021-07-03 IMAGING — MR MR THORACIC SPINE W/O CM
4 of 6 series · 19 of 48 positions shown · non-contrast
Comparison: MRI thoracic spine [DATE]

CLINICAL DATA: Mid back pain, leukocytosis

EXAM:
MRI THORACIC SPINE WITHOUT CONTRAST
TECHNIQUE: Multiplanar, multisequence MR imaging of the thoracic spine was
performed. No intravenous contrast was administered.

[Series 2: counting loc · sagittal · 4.0mm · 0.90mm/px · 3 of 10 slices shown]
[im 1/10]
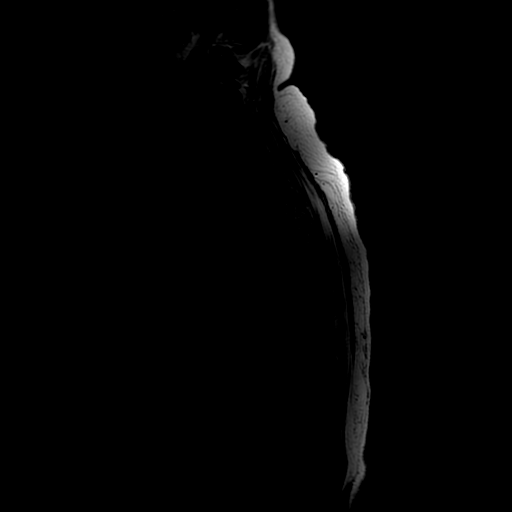
[im 5/10]
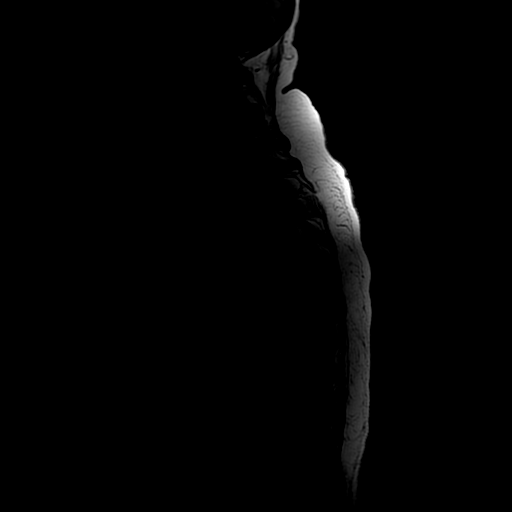
[im 10/10]
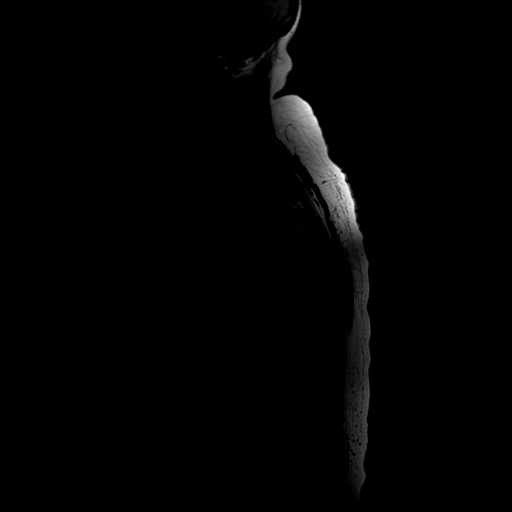

[Series 4: T2 · sagittal · 3.0mm · 0.62mm/px · 5 of 14 slices shown (1 of 2)]
[im 1/14]
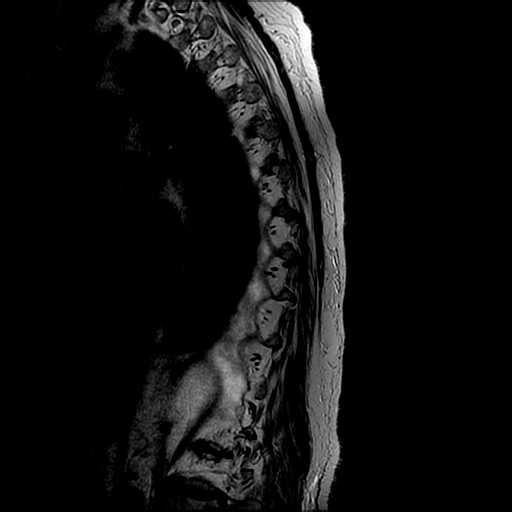
[im 4/14]
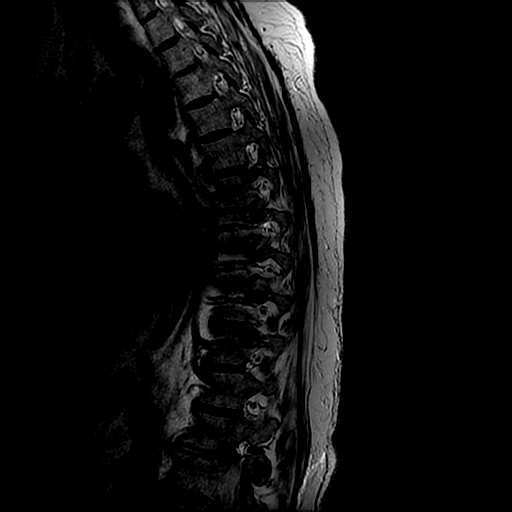
[im 7/14]
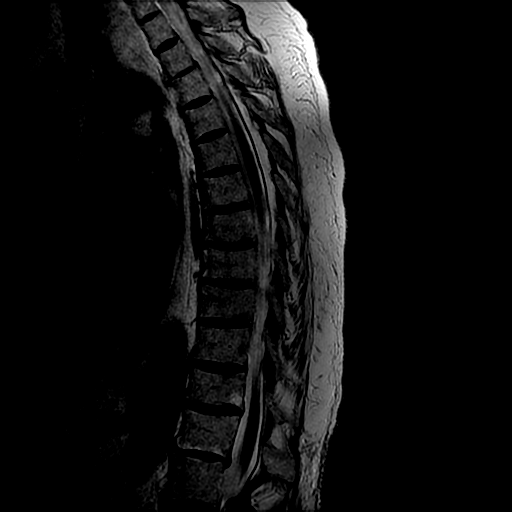
[im 10/14]
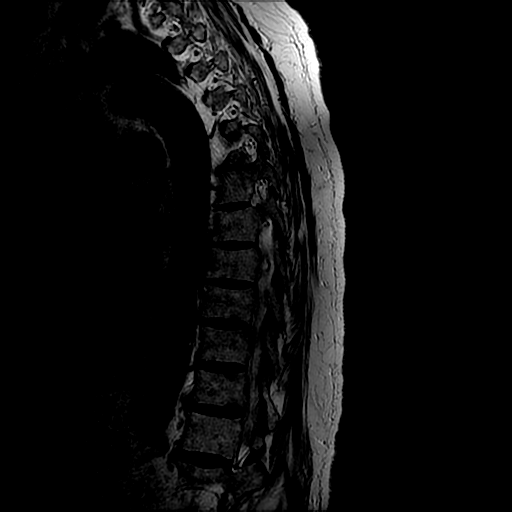
[im 14/14]
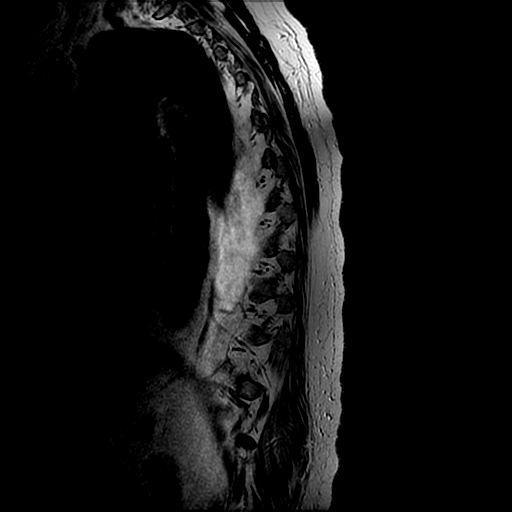

[Series 6: T1 · sagittal · 3.0mm · 0.62mm/px · 3 of 14 slices shown]
[im 1/14]
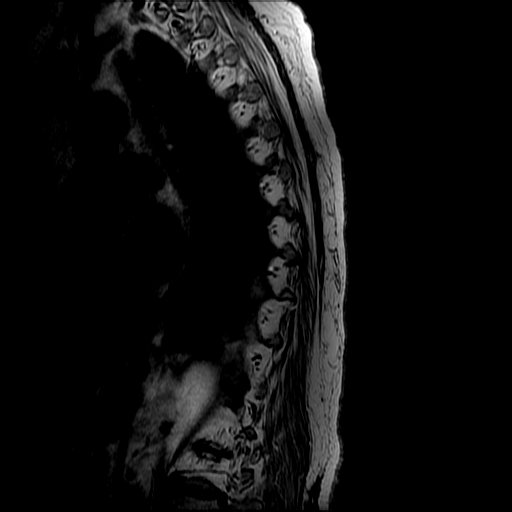
[im 7/14]
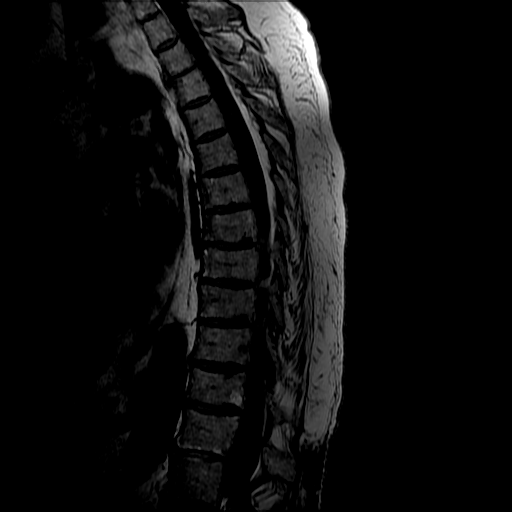
[im 14/14]
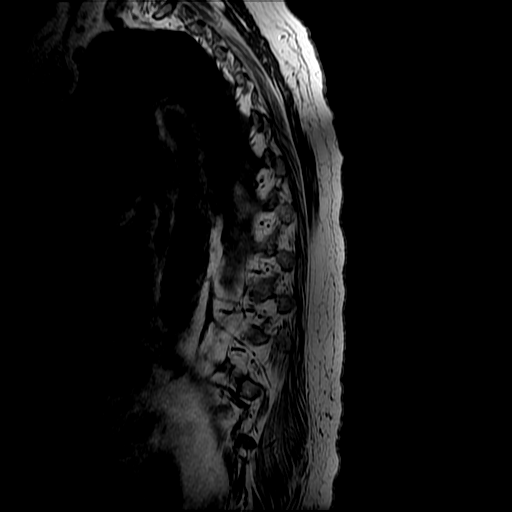

[Series 7: T2 · axial · 4.0mm · 0.43mm/px · z∈[-269,-63]mm · 8 of 36 slices shown (2 of 2)]
[im 1/36]
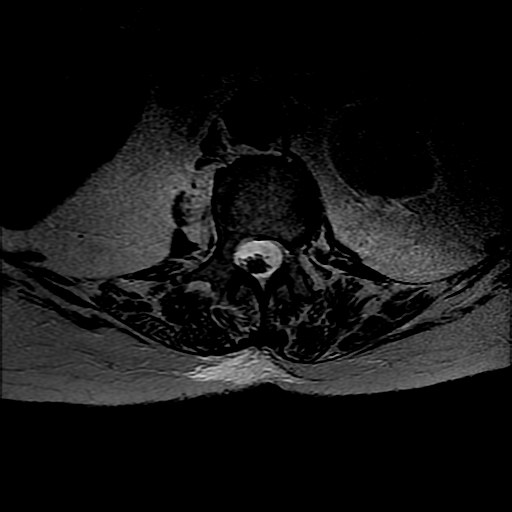
[im 6/36]
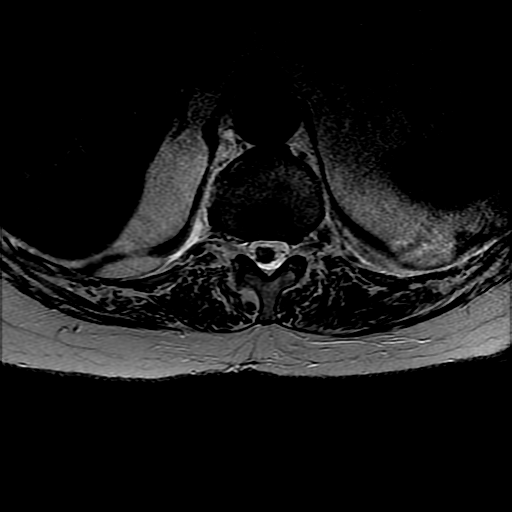
[im 11/36]
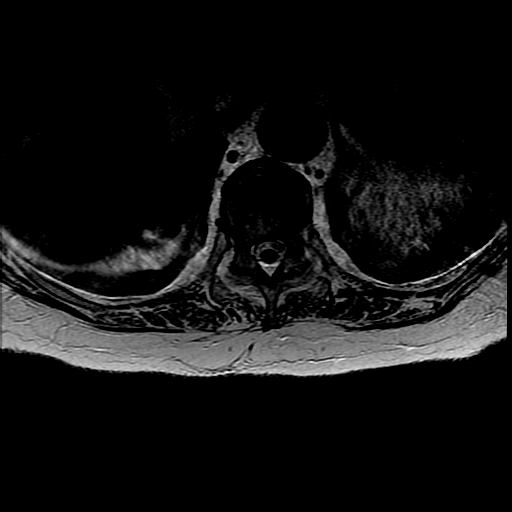
[im 17/36]
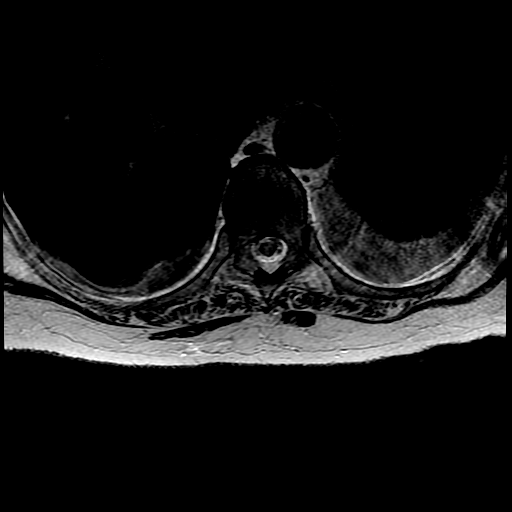
[im 19/36]
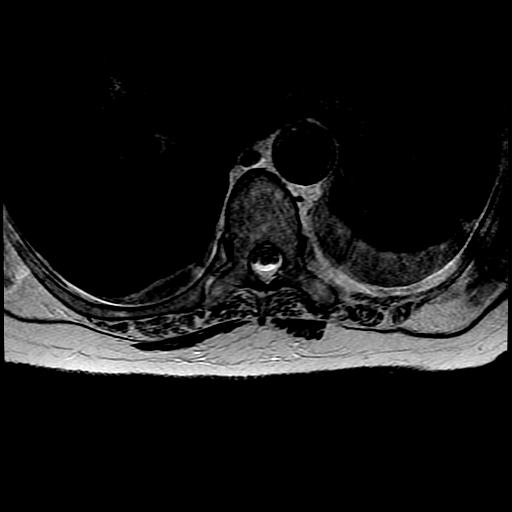
[im 25/36]
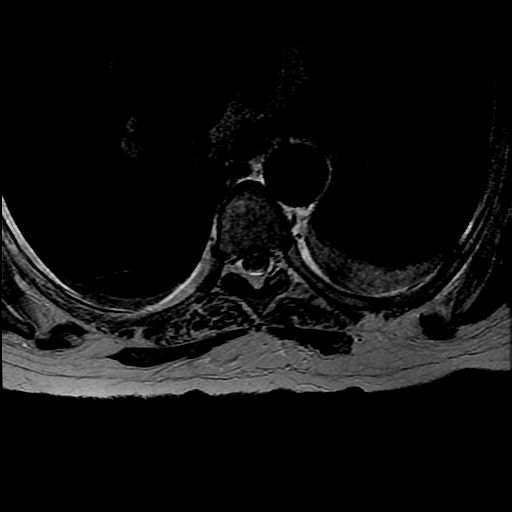
[im 30/36]
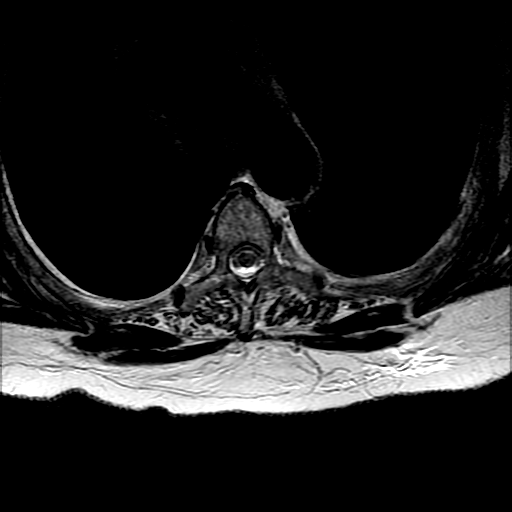
[im 36/36]
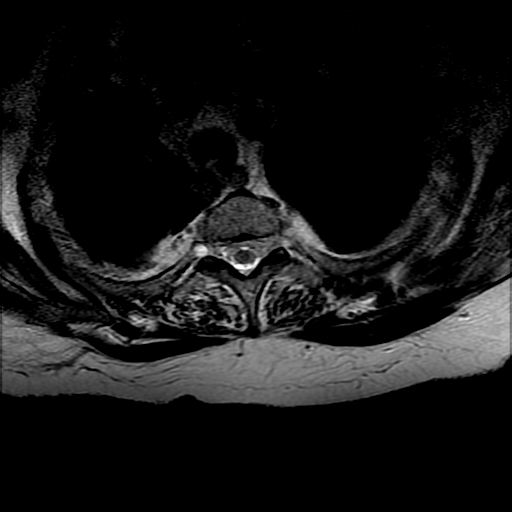

[19 of 48 positions shown; findings below may reference images not displayed]

FINDINGS: Alignment:  Normal alignment.  Mild levoscoliosis

Vertebrae: Negative for fracture or mass. No evidence of spinal
infection. No evidence of discitis or osteomyelitis

Cord:  Normal spinal cord signal.  Negative for cord compression

Paraspinal and other soft tissues: Small bilateral pleural
effusions. Negative for paraspinous fluid collection or abscess.

Disc levels:

Mild disc degeneration without stenosis above the T9-10 level

T9-10: Small central disc protrusion with small extruded disc
fragment extending cranially. No significant stenosis and no
interval change.

T10-11: Mild disc degeneration and Schmorl's node. Negative for
stenosis

T11-12: Mild disc degeneration without stenosis

T12-L1: Asymmetric disc degeneration and spurring on the right
causing right foraminal narrowing. No interval change.
IMPRESSION: Negative for thoracic spinal infection.

No change from the recent MRI.  No acute abnormality.

## 2021-07-03 NOTE — Progress Notes (Signed)
Interventional Radiology Brief Note:  Patient with MR findings of possible osteomyelitis and psoas muscle abscess.  IR consulted for aspiration and drainage.  Imaging reviewed by Dr. Kathlene Cote who notes the collection is currently very small, 59m.  Not amenable to aspiration/drainage at this time.  Spoke with Priya.  KBrynda Greathouse MS RD PA-C

## 2021-07-04 LAB — BASIC METABOLIC PANEL
Anion gap: 9 (ref 5–15)
BUN: 11 mg/dL (ref 8–23)
CO2: 28 mmol/L (ref 22–32)
Calcium: 8.7 mg/dL — ABNORMAL LOW (ref 8.9–10.3)
Chloride: 96 mmol/L — ABNORMAL LOW (ref 98–111)
Creatinine, Ser: 0.59 mg/dL (ref 0.44–1.00)
GFR, Estimated: >60 mL/min (ref >60)
Glucose, Bld: 92 mg/dL (ref 70–99)
Potassium: 3.2 mmol/L — ABNORMAL LOW (ref 3.5–5.1)
Sodium: 133 mmol/L — ABNORMAL LOW (ref 135–145)

## 2021-07-04 LAB — CULTURE, BLOOD (ROUTINE X 2)
Culture: NO GROWTH
Culture: NO GROWTH
Special Requests: ADEQUATE

## 2021-07-04 LAB — MAGNESIUM: Magnesium: 1.7 mg/dL (ref 1.7–2.4)

## 2021-07-04 NOTE — Progress Notes (Signed)
PROGRESS NOTE    Samantha King  QBH:[id number] DOB: 07/26/1946 DOA: 06/25/2021  Brief Narrative:  Samantha King is an 75 y.o. female with medical history significant for paroxysmal atrial fibrillation, COPD, hypertension, hyperlipidemia who presented to the acute facility on 06/18/2021 with severe back pain.  She was found to have bacteremia.  Blood culture showed MSSA.  She had CT done.  She was started on antibiotics.  She quickly declined and required ICU admission.  In the ICU she went into atrial fibrillation with RVR with resulting hypotension.  She was started on Cardizem drip and amiodarone drip as well as pressors.  She was also started on anticoagulation with heparin drip.  Transthoracic echocardiogram did not show any vegetation.  However, she was thought to have presumed endocarditis due to numerous pulmonary opacities likely septic emboli.  She was transferred to [facility].  She has been on treatment with IV Ancef.  However, had worsening leukocytosis.  Zosyn added. Repeat cultures ordered.  Denies having any weakness or any new neurologic symptoms.  Assessment & Plan:  Active Problems: Sepsis with shock, currently shock resolved MSSA bacteremia L3-4 discitis/osteomyelitis Left psoas abscess Presumptive endocarditis Leukocytosis Acute hypoxemic respiratory failure COPD Atrial fibrillation with RVR Acute kidney injury Coronary disease status post MI Back pain Protein calorie malnutrition Pulmonary nodules   Sepsis with shock: Patient presented to the acute facility with hypotension, sepsis.  Blood cultures showed MSSA bacteremia.  Transthoracic echocardiogram negative for vegetation.  However, she did have lesions suspicious for septic pulmonary emboli with presumptive endocarditis.  She is on treatment with IV Ancef.  Plan to treat for tentative duration of 8 weeks pending improvement.  Tentative end date 08/18/2021.  However,  now having worsening  leukocytosis.  IV Zosyn was ordered.  At this time do not think she needs both IV Zosyn and Ancef.  Recommended repeat MRI.  MRI of the lumbar spine per report interval development of bone marrow edema on both sides of disc space at L3-4 suspicious for discitis/osteomyelitis.  In addition-15 mm fluid collection within the left psoas which was not present on the recent MRI suspicious for abscess.  Again noted was multilevel degenerative change with severe spinal stenosis at L4-5.  At this time suggest to discontinue the Ancef and continue treatment with Zosyn.  IR consulted for the left psoas abscess but too small to drain per IR. Recommend to repeat MRI in the next 2 to 3 weeks.  Recommend to monitor WBC count closely. Continue to monitor closely.  She is high risk for recurrent sepsis and shock.   MSSA bacteremia/presumptive endocarditis: She had MSSA bacteremia at the acute facility.  Transthoracic echocardiogram negative for vegetation.  However, she did have pulmonary lesions concerning for septic emboli therefore presumptive endocarditis.  Antibiotics and plan as mentioned above.  Please monitor CBC, CMP, weekly ESR, CRP while on antibiotics.   Leukocytosis: Now having worsening WBC count despite being on Ancef.  MRI report as mentioned above.  Recommend to switch to IV Zosyn.  Monitor CBC, BMP, weekly ESR, CRP.     Acute hypoxemic respiratory failure: Patient on oxygen by nasal cannula.  Thought to be secondary to cardiogenic pulmonary edema versus septic emboli.  Continue supplemental oxygen. -Continue pulmonary hygiene.  Further management per the primary team.   COPD: Continue medication and management per the primary team.   A. fib with RVR: Patient was on amiodarone/heparin drip at the acute facility.  On metoprolol per cardiology.  Continue  to monitor.  Further medications and management per primary team.   Acute kidney injury: In the setting of sepsis/MSSA bacteremia.  Please monitor BUN/cr  closely while on antibiotics and adjust dose accordingly.   Coronary artery disease with history of MI: Continue medications and management per the primary team.   Back pain: She says she has chronic back pain.  Previous MRI of the lumbar spine at the acute facility showed L3-4 moderate multifactorial stenosis of the canal, foraminal stenosis left worse than right, L4-5 severe multifactorial spinal stenosis.  Foraminal stenosis right worse than left.  Repeat MRI as mentioned above concerning for discitis/osteomyelitis.  Antibiotics as mentioned above.  She denies having any new neurologic symptoms at this time.  Continue symptomatic management per primary team.  Monitor for any worsening or acute neurologic changes.   Protein-calorie malnutrition: Management per the primary team.   Pulmonary nodule/adrenal nodule: Incidentally noted on the imaging.  Follow-up outpatient.   Due to her complex medical problems she is high risk for worsening and decompensation.  Plan of care discussed with the primary team and pharmacy.   Subjective: She remains on oxygen by nasal cannula.  Complaining of some back pain but denies having any worsening or any new neurologic symptoms.  Objective: Vitals: Temperature 97.8, pulse 85, respiratory rate 22, blood pressure 142/83, pulse oximetry 94%  Examination: Constitutional: Elderly female, oriented x3, not in any acute distress. Head: Atraumatic, normocephalic Eyes: PERLA, EOMI, irises appear normal,   ENMT: external ears and nose appear normal, normal hearing, moist oral mucosa   Neck: Supple, normal ROM  CVS: S1-S2, no LE edema, normal pedal pulses  Respiratory: Decreased breath sound lower lobes, no rhonchi or wheezing Abdomen: soft nontender, nondistended, normal bowel sounds  Musculoskeletal: No edema Neuro: Debility with generalized weakness otherwise grossly nonfocal  Psych: judgement and insight appear normal, stable mood and affect, mental  status Skin: no new rashes   Data Reviewed: I have personally reviewed following labs and imaging studies  CBC: Recent Labs  Lab 06/28/21 0450 06/29/21 0351 06/30/21 0741 07/01/21 1032  WBC 20.5* 19.6* 15.7* 17.1*  HGB 11.3* 11.6* 10.9* 11.1*  HCT 35.7* 35.6* 34.6* 35.6*  MCV 90.2 88.8 89.4 90.1  PLT 436* 402* 419* 442*    Basic Metabolic Panel: Recent Labs  Lab 06/28/21 0450 06/29/21 0351 06/30/21 0741 07/01/21 1032 07/04/21 0326  NA 135 137 135 132* 133*  K 5.3* 4.9 3.7 4.0 3.2*  CL 95* 95* 95* 95* 96*  CO2 31 32 33* 27 28  GLUCOSE 108* 102* 105* 97 92  BUN _0 CREATININE 0.76 0.70 0.69 0.64 0.59  CALCIUM 9.0 9.4 9.1 9.0 8.7*  MG 1.8 1.9 1.7 2.0 1.7  PHOS 3.8 4.2 3.2 2.6  --     GFR: CrCl cannot be calculated (Unknown ideal weight.).  Liver Function Tests: No results for input(s): AST, ALT, ALKPHOS, BILITOT, PROT, ALBUMIN in the last 168 hours.  CBG: No results for input(s): GLUCAP in the last 168 hours.   Recent Results (from the past 240 hour(s))  Urine Culture     Status: None   Collection Time: 06/26/21 11:22 AM   Specimen: Urine, Catheterized  Result Value Ref Range Status   Specimen Description URINE, CATHETERIZED  Final   Special Requests NONE  Final   Culture   Final    NO GROWTH Performed at [facility], [address], [place], [place] [address]    Report Status  06/27/2021 FINAL  Final  Urine Culture     Status: None   Collection Time: 06/29/21 11:07 AM   Specimen: Urine, Catheterized  Result Value Ref Range Status   Specimen Description URINE, CATHETERIZED  Final   Special Requests NONE  Final   Culture   Final    NO GROWTH Performed at [facility], [address] [place], [place] [address]    Report Status 06/30/2021 FINAL  Final  Culture, blood (routine x 2)     Status: None   Collection Time: 06/29/21 12:18 PM   Specimen: BLOOD  Result Value Ref Range Status   Specimen Description BLOOD LEFT  ANTECUBITAL  Final   Special Requests   Final    AEROBIC BOTTLE ONLY Blood Culture results may not be optimal due to an inadequate volume of blood received in culture bottles   Culture   Final    NO GROWTH 5 DAYS Performed at [facility], [place] [address] [place], [place] [address]    Report Status 07/04/2021 FINAL  Final  Culture, blood (routine x 2)     Status: None   Collection Time: 06/29/21 12:23 PM   Specimen: BLOOD  Result Value Ref Range Status   Specimen Description BLOOD BLOOD LEFT HAND  Final   Special Requests AEROBIC BOTTLE ONLY Blood Culture adequate volume  Final   Culture   Final    NO GROWTH 5 DAYS Performed at [facility], [place] [address], [place], [place] [address]    Report Status 07/04/2021 FINAL  Final         Radiology Studies: MR THORACIC SPINE WO CONTRAST  Result Date: 07/03/2021 CLINICAL DATA:  Mid back pain, leukocytosis EXAM: MRI THORACIC SPINE WITHOUT CONTRAST TECHNIQUE: Multiplanar, multisequence MR imaging of the thoracic spine was performed. No intravenous contrast was administered. COMPARISON:  MRI thoracic spine 10/21/2021 FINDINGS: Alignment:  Normal alignment.  Mild levoscoliosis Vertebrae: Negative for fracture or mass. No evidence of spinal infection. No evidence of discitis or osteomyelitis Cord:  Normal spinal cord signal.  Negative for cord compression Paraspinal and other soft tissues: Small bilateral pleural effusions. Negative for paraspinous fluid collection or abscess. Disc levels: Mild disc degeneration without stenosis above the T9-10 level T9-10: Small central disc protrusion with small extruded disc fragment extending cranially. No significant stenosis and no interval change. T10-11: Mild disc degeneration and Schmorl's node. Negative for stenosis T11-12: Mild disc degeneration without stenosis T12-L1: Asymmetric disc degeneration and spurring on the right causing right foraminal narrowing. No interval change. IMPRESSION:  Negative for thoracic spinal infection. No change from the recent MRI.  No acute abnormality. Electronically Signed   By: Franchot Gallo M.D.   On: 07/03/2021 12:46   MR LUMBAR SPINE WO CONTRAST  Result Date: 07/03/2021 CLINICAL DATA:  Mid back pain.  Leukocytosis EXAM: MRI LUMBAR SPINE WITHOUT CONTRAST TECHNIQUE: Multiplanar, multisequence MR imaging of the lumbar spine was performed. No intravenous contrast was administered. COMPARISON:  MRI lumbar spine 06/21/2021 FINDINGS: Segmentation: L5 partially sacralized, unchanged from the prior numbering. Alignment: Lumbar scoliosis. 5 mm anterolisthesis L4-5 otherwise normal alignment Vertebrae:  Negative for fracture. Interval development of bone marrow edema on both sides of the L3-4 disc space. There is fluid signal intensity throughout the L3-4 disc space similar to the prior study. Given the interval of 12 days since the prior MRI, this may be related to early discitis and osteomyelitis given the history. Mild bone marrow sclerosis and edema on  the right at T12-L1 unchanged and consistent with disc degeneration. Conus medullaris and cauda equina: Conus extends to the L1 level. Conus and cauda equina appear normal. Paraspinal and other soft tissues: Interval development of small fluid collection left psoas muscle suspicious for abscess. This measures approximately 10 mm at the L4-5 level and 15 mm at the L5-S1 level. This is not seen on the prior MRI. Disc levels: L1-2: Disc degeneration and spurring at L1-2. Mild subarticular stenosis bilaterally L2-3: Asymmetric disc degeneration on the left with disc space narrowing and spurring. There is asymmetric fluid signal intensity on the left at L2-3 unchanged. This is likely degenerative. There is moderate left foraminal narrowing due to endplate spurring and facet hypertrophy. Spinal canal adequate in size L3-4: Disc degeneration, asymmetric to the left with associated spurring. Increased fluid signal intensity in  the disc space similar to the prior study. Interval development of bone marrow edema on both sides of the disc space compared to the recent MRI suspicious for discitis and osteomyelitis. Bilateral facet hypertrophy. Mild spinal stenosis. Moderate to severe subarticular and foraminal stenosis on the left. L4-5: 5 mm anterolisthesis. Disc and facet degeneration with severe spinal stenosis. Severe subarticular and foraminal stenosis on the left. Moderate to severe left subarticular stenosis. Left foramen patent. Unilateral pars defect of L4 on the right, best seen on prior lumbar CT. L5-S1: Disc degeneration with marked disc space narrowing. No significant stenosis. IMPRESSION: 1. Interval development of bone marrow edema on both sides of the disc space at L3-4, suspicious for discitis and osteomyelitis. In addition, there is a 10-15 mm fluid collection within the left psoas which was not present on the recent MRI and is suspicious for abscess. No epidural abscess. 2. Multilevel degenerative change with severe spinal stenosis at L4-5. Electronically Signed   By: Franchot Gallo M.D.   On: 07/03/2021 12:59     Scheduled Meds: Please see MAR   Yaakov Guthrie, MD  07/04/2021, 4:10 PM

## 2021-07-05 LAB — MAGNESIUM: Magnesium: 2.2 mg/dL (ref 1.7–2.4)

## 2021-07-05 LAB — C-REACTIVE PROTEIN: CRP: 7.9 mg/dL — ABNORMAL HIGH (ref <1.0)

## 2021-07-05 LAB — MRSA NEXT GEN BY PCR, NASAL: MRSA by PCR Next Gen: NOT DETECTED

## 2021-07-06 LAB — CBC
HCT: 32.7 % — ABNORMAL LOW (ref 36.0–46.0)
Hemoglobin: 10.1 g/dL — ABNORMAL LOW (ref 12.0–15.0)
MCH: 28.3 pg (ref 26.0–34.0)
MCHC: 30.9 g/dL (ref 30.0–36.0)
MCV: 91.6 fL (ref 80.0–100.0)
Platelets: 486 10*3/uL — ABNORMAL HIGH (ref 150–400)
RBC: 3.57 MIL/uL — ABNORMAL LOW (ref 3.87–5.11)
RDW: 17 % — ABNORMAL HIGH (ref 11.5–15.5)
WBC: 11.6 10*3/uL — ABNORMAL HIGH (ref 4.0–10.5)
nRBC: 0 % (ref 0.0–0.2)

## 2021-07-06 LAB — BASIC METABOLIC PANEL
Anion gap: 6 (ref 5–15)
BUN: 9 mg/dL (ref 8–23)
CO2: 33 mmol/L — ABNORMAL HIGH (ref 22–32)
Calcium: 9 mg/dL (ref 8.9–10.3)
Chloride: 98 mmol/L (ref 98–111)
Creatinine, Ser: 0.67 mg/dL (ref 0.44–1.00)
GFR, Estimated: >60 mL/min (ref >60)
Glucose, Bld: 95 mg/dL (ref 70–99)
Potassium: 3.9 mmol/L (ref 3.5–5.1)
Sodium: 137 mmol/L (ref 135–145)

## 2021-07-06 LAB — SEDIMENTATION RATE: Sed Rate: 87 mm/hr — ABNORMAL HIGH (ref 0–22)

## 2021-07-06 LAB — C-REACTIVE PROTEIN: CRP: 7.7 mg/dL — ABNORMAL HIGH (ref <1.0)

## 2021-07-06 LAB — MAGNESIUM: Magnesium: 2 mg/dL (ref 1.7–2.4)

## 2021-07-08 LAB — URIC ACID: Uric Acid, Serum: 2.3 mg/dL — ABNORMAL LOW (ref 2.5–7.1)

## 2021-07-09 ENCOUNTER — Encounter: Payer: Self-pay | Admitting: Internal Medicine

## 2021-07-09 LAB — RENAL FUNCTION PANEL
Albumin: 2 g/dL — ABNORMAL LOW (ref 3.5–5.0)
Anion gap: 10 (ref 5–15)
BUN: 9 mg/dL (ref 8–23)
CO2: 29 mmol/L (ref 22–32)
Calcium: 9.3 mg/dL (ref 8.9–10.3)
Chloride: 98 mmol/L (ref 98–111)
Creatinine, Ser: 0.72 mg/dL (ref 0.44–1.00)
GFR, Estimated: >60 mL/min (ref >60)
Glucose, Bld: 105 mg/dL — ABNORMAL HIGH (ref 70–99)
Phosphorus: 2.7 mg/dL (ref 2.5–4.6)
Potassium: 3.3 mmol/L — ABNORMAL LOW (ref 3.5–5.1)
Sodium: 137 mmol/L (ref 135–145)

## 2021-07-09 LAB — CBC
HCT: 31.9 % — ABNORMAL LOW (ref 36.0–46.0)
Hemoglobin: 10 g/dL — ABNORMAL LOW (ref 12.0–15.0)
MCH: 28.2 pg (ref 26.0–34.0)
MCHC: 31.3 g/dL (ref 30.0–36.0)
MCV: 90.1 fL (ref 80.0–100.0)
Platelets: 538 10*3/uL — ABNORMAL HIGH (ref 150–400)
RBC: 3.54 MIL/uL — ABNORMAL LOW (ref 3.87–5.11)
RDW: 17.2 % — ABNORMAL HIGH (ref 11.5–15.5)
WBC: 12.6 10*3/uL — ABNORMAL HIGH (ref 4.0–10.5)
nRBC: 0 % (ref 0.0–0.2)

## 2021-07-09 LAB — MAGNESIUM: Magnesium: 1.8 mg/dL (ref 1.7–2.4)

## 2021-07-09 NOTE — PMR Pre-admission (Signed)
PMR Admission Coordinator Pre-Admission Assessment   Patient: Samantha King is an 75 y.o., female MRN: [id number] DOB: 02/12/1946 Height:  5' 3" Weight:  84.8 kg   Insurance Information HMO:     PPO:      PCP:      IPA:      80/20: yes     OTHER:  PRIMARY:Medicare Part A and B       Policy#: [id number]     Subscriber: Pt.  Medicare       Policy#:       Subscriber: Pt. Phone#: Verified online    Fax#:  Pre-Cert#:       Employer:  Benefits:  Phone #:      Name:  Eff. Date: Parts A ad B effective 11/10/2008  Deduct: $1556      Out of Pocket Max:  None      Life Max: N/A  CIR: 100%      SNF: 100 days Outpatient: 80%     Co-Pay: 20% Home Health: 100%      Co-Pay: none DME: 80%     Co-Pay: 20% Providers: patient's choice SECONDARY: BCBS Supplement      Policy#: [id number]      Phone#:    Financial Counselor:       Phone#:    The "Data Collection Information Summary" for patients in Inpatient Rehabilitation Facilities with attached "Privacy Act Statement-Health Care Records" was provided and verbally reviewed with: Patient   Emergency Contact Information Contact Information       Name Relation Home Work Mobile    Verrill,Harold Spouse [phone]        Lafata,Jay Son [phone]               Current Medical History  Patient Admitting Diagnosis: Debility, bacteremia, discitis    History of Present Illness: HPI: Samantha King is a 75 year old female with history of CAD s/p VF arrest, PUD, tobacco abuse who was originally admitted to [facility] on 06/18/21 with reports of fevers and chills X one week with severe back/ Left knee pain and leucocytosis with WBC 24.9 due to sepsis from MSSA bacteremia.  CT spine showed L4 on L4 on L5 anterolisthesis with severe canal stenosis, sdxtrocurvature centered at L3/L3, moderate left foraminal stenosis L2-L4. CTA chest/abdomen negative for dissection and showed multiple pulmonary nodules up to 2 cm, NO PE, left adrenal nodules, 6.2  mm non obstructive left renal stone, diverticulosis and aortic atherosclerosis with centrilobular/paraseptal emphysema. ID consulted for input and expressed concerns of vertebral infection/diskitis. Patient started on Cefazolin with recommendations of MRI for work up.     Hospital course significant for decompensation from septic shock on 08/11 with hypoxia, marked hypotension and A fib with RVR. Dr. Nasher consulted for input and she was started on IV heparin, pressors, Cardizem as well as amiodarone for rate control. 2D echo showed  EF 60-65% with moderate asymmetric LVH and moderate TVR.  Respiratory failure felt to be due to acute diastolic CHF in setting of A fib and respiratory status improve with high flow oxygen and nebs. PNA/pulmonary effusions felt to be due to pulmonary septic emboli with presumed endocarditis  (due to numerous pulmonary opacities) and diuresis limited by AKI.    She did have rise in WBC to 33.5 and repeat BC X 2 are negative. Follow up MRI spine on 08/12 showed severe multifactorial spinal stenosis L4/5 and moderate stenosis L3/4. MRI abdomen done showing benign left adrenal adenomas, LLL   PNA. She continued to have severe back pain out of proportion due to chronic changes but developed extreme sedation with low dose fentanyl. Dr. Snider recommended Cefazolin 2 gram IV every 8 hours for 6 weeks with end-date 08/01/21. Pain control was improving with  titration of meds and Ketorolac but she continued to have hypoxia requiring 40-50% HFNC. Therapy was ongoing and patient was limited to bed mobility with inability to lift RUE due to pain, trace movements of BLE and inability to tolerate ROM of any extremities.    She was transferred to [facility] on 08/16 for management and therapy. Respiratory status has improved and she has been weaned down to 4 L oxygen at rest. She started having rise in WBC-19.6 and sed rate-106  and ID consulted for  input. MRI T/L spine done 08/24 revealing interval  development of marrow edema bilateral disc space L3/4 suspicious for discitis and osteomyelitis as well as 10-15 ml fluid collection within left psoas suspicious for abscess. IR consulted for aspiration/biopsy but felt that fluid not amenable to aspiration. Antibiotic changed to Zosyn on 08/25 by Dr. Matcha. She developed bilateral foot pain with erythema due to gout flare on 08/28 and was started on steroids as well as colcryis with improvement in symptoms.    Patient's medical record from [facility] has been reviewed by the rehabilitation admission coordinator and physician.   Past Medical History      Past Medical History:  Diagnosis Date   CAD (coronary artery disease)      a. s/p INF-LAT STEMI => s/p DES-RCA c/b VF arrest and cardiogenic shock   Cardiac arrest (HCC) 10/2012    STEMI with VF arrest x 2    GERD (gastroesophageal reflux disease)     H1N1 influenza 10/2012   Hx of echocardiogram      a. Echo 10/30/12: Moderate LVH, EF 55-60%, normal wall motion, PASP 45   Hyperlipidemia     Hypertension     Myocardial infarction (HCC)     Pneumonia 10/2012    a. STEMI c/b LLL pneumonia in setting of recent H1N1 influenza   PUD (peptic ulcer disease)     Syncope 1996    reported episode while driving in [place] in 1996 with full cardiac workup = negative   Tobacco abuse        Has the patient had major surgery during 100 days prior to admission? No   Family History   family history includes Heart attack in her brother; Stroke in her maternal grandmother.   Current Medications No current facility-administered medications for this encounter.   Patients Current Diet: Diet Regular    Precautions / Restrictions Weight Bearing Restrictions: No     Has the patient had 2 or more falls or a fall with injury in the past year? No   Prior Activity Level Community (5-7x/wk): Pt. was active in the community PTA     Prior Functional Level Self Care: Did the patient  need help bathing, dressing, using the toilet or eating? Independent   Indoor Mobility: Did the patient need assistance with walking from room to room (with or without device)? Independent   Stairs: Did the patient need assistance with internal or external stairs (with or without device)? Independent   Functional Cognition: Did the patient need help planning regular tasks such as shopping or remembering to take medications? Independent   Patient Information Are you of Hispanic, Latino/a,or Spanish origin?: A. No, not of Hispanic, Latino/a, or Spanish origin   What is your race?: A. White Do you need or want an interpreter to communicate with a doctor or health care staff?: 0. No   Patient's Response To:  Health Literacy and Transportation Is the patient able to respond to health literacy and transportation needs?: No   Home Assistive Devices / Equipment   Prior Device Use: Indicate devices/aids used by the patient prior to current illness, exacerbation or injury? None of the above     Prior Functional Level Current Functional Level  Bed Mobility   Independent Min A  Transfers   Independent Max A   Mobility - Walk/Wheelchair   Independent Not attempted  Upper Body Dressing   Independent Max A  Lower Body Dressing   Independent Max A   Grooming   Independent Mod A  Eating/Drinking   Independent Min A  Toilet Transfer   Independent Not attempted  Bladder Continence    Independent Continent of Bladder  Bowel Management   Independent Continent of Bowel  Stair Climbing   Independent Not attempted  Communication   Independent Independent  Memory   Independent Independent    Special Needs/ Care Considerations Oxygen 4L and Skin Pressure wound to the sacrum   Previous Home Environment (from acute therapy documentation)   Discharge Living Setting Plans for Discharge Living Setting: Patient's home Type of Home at Discharge: House Discharge Home Layout: One level Discharge  Home Access: Stairs to enter Entrance Stairs-Rails: Right Entrance Stairs-Number of Steps: 3-4 Discharge Bathroom Shower/Tub: Tub/shower unit; Walk-in shower Discharge Bathroom Toilet: Standard Discharge Bathroom Accessibility: No     Social/Family/Support Systems Patient Roles: Spouse Contact Information: [phone] Anticipated Caregiver: Harrold Cushman Anticipated Caregiver's Contact Information: [phone] Ability/Limitations of Caregiver: can provide min-mod A Caregiver Availability: 24/7 Discharge Plan Discussed with Primary Caregiver: Yes Is Caregiver In Agreement with Plan?: Yes Does Caregiver/Family have Issues with Lodging/Transportation while Pt is in Rehab?: No     Goals Patient/Family Goal for Rehab: PT/OT mod A, SLP mod I Expected length of stay: 18-21 days Pt/Family Agrees to Admission and willing to participate: Yes Program Orientation Provided & Reviewed with Pt/Caregiver Including Roles  & Responsibilities: Yes     Decrease burden of Care through IP rehab admission: Specialzed equipment needs, Decrease number of caregivers, Bowel and bladder program, and Patient/family education   Possible need for SNF placement upon discharge: not anticipated   Patient Condition: I have reviewed medical records from [facility] , spoken with CM, and patient and spouse. I met with patient at the bedside for inpatient rehabilitation assessment.  Patient will benefit from ongoing PT, OT, and SLP, can actively participate in 3 hours of therapy a day 5 days of the week, and can make measurable gains during the admission.  Patient will also benefit from the coordinated team approach during an Inpatient Acute Rehabilitation admission.  The patient will receive intensive therapy as well as Rehabilitation physician, nursing, social worker, and care management interventions.  Due to bladder management, bowel management, safety, skin/wound care, disease management,  medication administration, pain management, and patient education the patient requires 24 hour a day rehabilitation nursing.  The patient is currently max A with mobility and basic ADLs.  Discharge setting and therapy post discharge at home with home health is anticipated.  Patient has agreed to participate in the Acute Inpatient Rehabilitation Program and will admit today.   Preadmission Screen Completed By:  Laura B Staley, 07/09/2021 11:15 AM ______________________________________________________________________   Discussed status with Dr. Andrina Locken on 945   at 07/10/21 and received approval for admission today.   Admission Coordinator:  Laura B Staley, CCC-SLP, time 945/Date 07/10/21    Assessment/Plan: Diagnosis: disseminated MSSA bacteremia Does the need for close, 24 hr/day Medical supervision in concert with the patient's rehab needs make it unreasonable for this patient to be served in a less intensive setting? Yes Co-Morbidities requiring supervision/potential complications: CAD s/p VF arrest, PUD, tobacco abuse  Due to bladder management, bowel management, safety, skin/wound care, disease management, medication administration, and patient education, does the patient require 24 hr/day rehab nursing? Yes Does the patient require coordinated care of a physician, rehab nurse, PT, OT, and SLP to address physical and functional deficits in the context of the above medical diagnosis(es)? Yes Addressing deficits in the following areas: balance, endurance, locomotion, strength, transferring, bathing, dressing, toileting, speech, swallowing, and psychosocial support Can the patient actively participate in an intensive therapy program of at least 3 hrs of therapy 5 days a week? Yes The potential for patient to make measurable gains while on inpatient rehab is excellent Anticipated functional outcomes upon discharge from inpatient rehab: min assist and mod assist PT, min assist and mod assist OT, modified  independent SLP Estimated rehab length of stay to reach the above functional goals is: 14-17 days. Anticipated discharge destination: Home 10. Overall Rehab/Functional Prognosis: good   MD Signature Cayle Thunder, MD, ABPMR 

## 2021-07-09 NOTE — H&P (Signed)
Physical Medicine and Rehabilitation Admission H&P    CC: Functional deficits due to disseminated MSSA bacteremia.    HPI: Samantha King. Burgueno is a 75 year old female with history of CAD s/p VF arrest, PUD, tobacco abuse who was originally admitted to [facility] on 06/18/2021 with reports of fevers and chills X one week with severe back/ Left knee pain and leucocytosis with WBCs 24/9 due to sepsis from MSSA bacteremia.  History taken from chart review and patient. CT spine showed L4-L5 anterolisthesis with severe canal stenosis, moderate left foraminal stenosis L3-L4. CTA chest/abdomen negative for dissection and showed multiple pulmonary nodules up to 2 cm, NO PE, left adrenal nodules, 6.2 mm non obstructive left renal stone, diverticulosis and aortic atherosclerosis with centrilobular/paraseptal emphysema. ID consulted for input and expressed concerns of vertebral infection/diskitis. Patient started on Cefazolin with recommendations of MRI for work up.    Hospital course significant for decompensation from septic shock on 06/20/2021 with hypoxia, marked hypotension and A fib with RVR. Dr. Cathie Olden consulted for input and she was started on IV heparin, pressors, Cardizem as well as amiodarone for rate control. 2D Echo showed  EF 60-65% with moderate asymmetric LVH and moderate TVR.  Respiratory failure felt to be due to acute diastolic CHF in setting of A fib and respiratory status improve with high flow oxygen and nebs. PNA/pulmonary effusions felt to be due to pulmonary septic emboli with presumed endocarditis  (due to numerous pulmonary opacities) and diuresis limited by AKI.   She did have rise in WBC to 33.5 and repeat BC X 2 are negative. Follow up MRI spine on 08/12 showed severe multifactorial spinal stenosis L4/5 and moderate stenosis L3/4. MRI abdomen done showing benign left adrenal adenomas, LLL PNA. She continued to have severe back pain out of proportion due to chronic changes but developed  extreme sedation with low dose fentanyl. Dr. Baxter Flattery recommended IV Cefazolin  2 gram every 8 hours for 6 weeks with end-date 08/01/21. Pain control was improving with  titration of meds and Ketorolac but she continued to have hypoxia requiring 40-50% HFNC. Therapy was ongoing and patient was limited to bed mobility with inability to lift RUE due to pain, trace movements of BLE and inability to tolerate ROM of any extremities.   She was transferred to [facility] on 08/16 for management and therapy. Respiratory status has improved and she has been weaned down to 4L supplemental oxygen at rest. She started having rise in WBC-19.6 and sed rate-106  and ID consulted for  input. MRI T/L spine done 08/24 revealing interval development of marrow edema bilateral disc space L3/4 suspicious for discitis and osteomyelitis as well as 10-15 ml fluid collection within left psoas suspicious for abscess. IR consulted for aspiration/biopsy but felt that fluid not amenable to aspiration. Antibiotic changed to Zosyn on 08/25 by Dr. Barth Kirks. She developed bilateral foot pain with erythema due to gout flare on 08/28 and was started on prednisone 30  mg X 5 days as well as colcrys with improvement in symptoms. Cognition is back to baseline per husband and she is showing improvement in activity tolerance and participation but continues to be deconditioned. CIR recommended due to functional decline. Please see preadmission assessment from earlier today as well.   Review of Systems  Constitutional:  Negative for chills and fever.  Eyes:  Negative for photophobia and pain.  Respiratory:  Positive for shortness of breath. Negative for cough, sputum production, wheezing and stridor.   Cardiovascular:  Negative for chest pain, palpitations and leg swelling.  Gastrointestinal:  Positive for diarrhea (with incontinence) and heartburn. Negative for constipation and nausea.  Genitourinary:        Incontinent of bladder since foley removed 3 days  ago.   Musculoskeletal:  Positive for myalgias. Negative for back pain and joint pain.  Skin:  Negative for rash.  Neurological:  Positive for sensory change (BLE) and weakness. Negative for dizziness and headaches.  All other systems reviewed and are negative.   Past Medical History:  Diagnosis Date   CAD (coronary artery disease)    a. s/p INF-LAT STEMI => s/p DES-RCA c/b VF arrest and cardiogenic shock   Cardiac arrest ([place]) 10/2012   STEMI with VF arrest x 2    GERD (gastroesophageal reflux disease)    H1N1 influenza 10/2012   Hx of echocardiogram    a. Echo 10/30/12: Moderate LVH, EF 55-60%, normal wall motion, PASP 45   Hyperlipidemia    Hypertension    Myocardial infarction ([place])    Pneumonia 10/2012   a. STEMI c/b LLL pneumonia in setting of recent H1N1 influenza   PUD (peptic ulcer disease)    Syncope 1996   reported episode while driving in [place] in 1996 with full cardiac workup = negative   Tobacco abuse     Past Surgical History:  Procedure Laterality Date   BREAST EXCISIONAL BIOPSY     left breast ? 1981   CORONARY ANGIOPLASTY WITH STENT PLACEMENT     s/p inferior STEMI c/b VF arrest and cardiogenic shock requiring IABP   ESOPHAGOGASTRODUODENOSCOPY (EGD) WITH PROPOFOL N/A 03/10/2020   Procedure: ESOPHAGOGASTRODUODENOSCOPY (EGD) WITH PROPOFOL;  Surgeon: Lavena Bullion, DO;  Location: [place];  Service: Gastroenterology;  Laterality: N/A;   HEMOSTASIS CLIP PLACEMENT  03/10/2020   Procedure: HEMOSTASIS CLIP PLACEMENT;  Surgeon: Lavena Bullion, DO;  Location: [place] ENDOSCOPY;  Service: Gastroenterology;;   HEMOSTASIS CONTROL  03/10/2020   Procedure: HEMOSTASIS CONTROL;  Surgeon: Lavena Bullion, DO;  Location: [place] ENDOSCOPY;  Service: Gastroenterology;;  epi   INTRA-AORTIC BALLOON PUMP INSERTION  10/29/2012   Procedure: INTRA-AORTIC BALLOON PUMP INSERTION;  Surgeon: Burnell Blanks, MD;  Location: [facility] CATH LAB;  Service: Cardiovascular;;   LEFT HEART  CATHETERIZATION WITH CORONARY ANGIOGRAM N/A 10/29/2012   Procedure: LEFT HEART CATHETERIZATION WITH CORONARY ANGIOGRAM;  Surgeon: Burnell Blanks, MD;  Location[facility] CATH LAB;  Service: Cardiovascular;  Laterality: N/A;   PERCUTANEOUS CORONARY STENT INTERVENTION (PCI-S)  10/29/2012   Procedure: PERCUTANEOUS CORONARY STENT INTERVENTION (PCI-S);  Surgeon: Burnell Blanks, MD;  Location: [facility] CATH LAB;  Service: Cardiovascular;;   TUBAL LIGATION      Family History  Problem Relation Age of Onset   Lung cancer Father    Heart attack Brother    Stroke Maternal Grandmother    Colon cancer Neg Hx    Pancreatic cancer Neg Hx    Prostate cancer Neg Hx    Rectal cancer Neg Hx    Stomach cancer Neg Hx     Social History: Married. 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 She  reports that she quit smoking about 8 years ago. Her smoking use included cigarettes. She has a 20.00 pack-year smoking history. She has never used smokeless tobacco. She reports that she does not drink alcohol and does not use drugs.   Allergies  Allergen Reactions   Shellfish Allergy Nausea And Vomiting   Codeine Other (  See Comments)    Makes me hyperactive and not able to sleep   Lidocaine Palpitations    Medications Prior to Admission  Medication Sig Dispense Refill   acetaminophen (TYLENOL) 325 MG tablet Take 650 mg by mouth every 6 (six) hours as needed for mild pain or headache. For headache or pain     arformoterol (BROVANA) 15 MCG/2ML NEBU Take 2 mLs (15 mcg total) by nebulization 2 (two) times daily. 120 mL    atorvastatin (LIPITOR) 80 MG tablet Take 1 tablet (80 mg total) by mouth daily at 6 PM. 30 tablet 1   ceFAZolin (ANCEF) IVPB Inject 2 g into the vein every 8 (eight) hours. Indication:  Presumed endocarditis First Dose: No Last Day of Therapy:  08/01/21 Labs - Once weekly:  CBC/D and BMP, Labs - Every other week:  ESR and CRP Method of administration: IV  Push Method of administration may be changed at the discretion of home infusion pharmacist based upon assessment of the patient and/or caregiver's ability to self-administer the medication ordered. 111 Units 0   Chlorhexidine Gluconate Cloth 2 % PADS Apply 6 each topically daily.     clonazePAM (KLONOPIN) 0.25 MG disintegrating tablet Take 1 tablet (0.25 mg total) by mouth 2 (two) times daily. 60 tablet 0   clopidogrel (PLAVIX) 75 MG tablet Take 1 tablet (75 mg total) by mouth daily. 90 tablet 3   diclofenac Sodium (VOLTAREN) 1 % GEL Apply 4 g topically 4 (four) times daily.     DULoxetine (CYMBALTA) 30 MG capsule Take 1 capsule (30 mg total) by mouth daily.  3   escitalopram (LEXAPRO) 5 MG tablet Take 5 mg by mouth daily.     furosemide (LASIX) 10 MG/ML injection Inject 4 mLs (40 mg total) into the vein every 6 (six) hours. 4 mL 0   gabapentin (NEURONTIN) 100 MG capsule Take 2 capsules (200 mg total) by mouth 3 (three) times daily.     heparin 5000 UNIT/ML injection Inject 1 mL (5,000 Units total) into the skin every 8 (eight) hours. 1 mL    ketorolac (TORADOL) 15 MG/ML injection Inject 1 mL (15 mg total) into the vein every 8 (eight) hours. 1 mL    lidocaine (LIDODERM) 5 % Place 1 patch onto the skin daily. Remove & Discard patch within 12 hours or as directed by MD 30 patch 0   metoprolol tartrate (LOPRESSOR) 25 MG tablet Take 1 tablet (25 mg total) by mouth 2 (two) times daily. 60 tablet 1   metoprolol tartrate (LOPRESSOR) 5 MG/5ML SOLN injection Inject 2.5-5 mLs (2.5-5 mg total) into the vein every 3 (three) hours as needed (HR 120 & above). 15 mL    ondansetron (ZOFRAN) 4 MG/2ML SOLN injection Inject 2 mLs (4 mg total) into the vein every 6 (six) hours as needed for nausea or vomiting. 2 mL 0   oxyCODONE (OXY IR/ROXICODONE) 5 MG immediate release tablet Take 1 tablet (5 mg total) by mouth every 4 (four) hours as needed for moderate pain. 30 tablet 0   pantoprazole (PROTONIX) 40 MG tablet Take  1 tablet (40 mg total) by mouth daily. 90 tablet 1   potassium chloride SA (KLOR-CON) 20 MEQ tablet Take 2 tablets (40 mEq total) by mouth 2 (two) times daily.     revefenacin (YUPELRI) 175 MCG/3ML nebulizer solution Take 3 mLs (175 mcg total) by nebulization daily. 90 mL    sodium chloride 0.9 % infusion Inject 250 mLs into the vein continuous.  0   sodium chloride flush (NS) 0.9 % SOLN 10-40 mLs by Intracatheter route every 12 (twelve) hours.     sodium chloride flush (NS) 0.9 % SOLN 10-40 mLs by Intracatheter route as needed (flush).      Drug Regimen Review  Drug regimen was reviewed and remains appropriate with no significant issues identified  Home: One  level home with 4 STE    Functional History: Independent and active PTA.  Volunteers and was going out daily.  Functional Status:  Mobility: Min assist for bed mobility Max assist for transers     ADL: Mod assist for grooming Max assist for UB/LB care  Cognition:    There were no vitals taken for this visit. Physical Exam Vitals and nursing note reviewed.  Constitutional:      Appearance: Normal appearance. She is not ill-appearing.  HENT:     Head: Normocephalic and atraumatic.     Right Ear: External ear normal.     Left Ear: External ear normal.     Nose: Nose normal.  Eyes:     General:        Right eye: No discharge.        Left eye: No discharge.     Extraocular Movements: Extraocular movements intact.  Cardiovascular:     Rate and Rhythm: Normal rate and regular rhythm.  Pulmonary:     Effort: Pulmonary effort is normal. No respiratory distress.     Breath sounds: Normal breath sounds. No stridor.     Comments: +[place] Abdominal:     General: There is no distension.     Palpations: Abdomen is soft.  Musculoskeletal:     Cervical back: Normal range of motion and neck supple.     Comments: No edema or tenderness in extremities  Skin:    General: Skin is warm and dry.     Coloration: Skin is not  jaundiced.  Neurological:     Mental Status: She is alert and oriented to person, place, and time.     Comments: Alert Motor: 4-4+/5 throughout Sensation diminished to light touch in b/l toes  Psychiatric:        Mood and Affect: Mood normal.        Behavior: Behavior normal.    Results for orders placed or performed during the hospital encounter of 06/25/21 (from the past 48 hour(s))  CBC     Status: Abnormal   Collection Time: 07/09/21  3:46 AM  Result Value Ref Range   WBC 12.6 (H) 4.0 - 10.5 K/uL   RBC 3.54 (L) 3.87 - 5.11 MIL/uL   Hemoglobin 10.0 (L) 12.0 - 15.0 g/dL   HCT 31.9 (L) 36.0 - 46.0 %   MCV 90.1 80.0 - 100.0 fL   MCH 28.2 26.0 - 34.0 pg   MCHC 31.3 30.0 - 36.0 g/dL   RDW 17.2 (H) 11.5 - 15.5 %   Platelets 538 (H) 150 - 400 K/uL   nRBC 0.0 0.0 - 0.2 %    Comment: Performed at [facility], [address] [place], [place] [address]  Renal function panel     Status: Abnormal   Collection Time: 07/09/21  3:46 AM  Result Value Ref Range   Sodium 137 135 - 145 mmol/L   Potassium 3.3 (L) 3.5 - 5.1 mmol/L   Chloride 98 98 - 111 mmol/L   CO2 29 22 - 32 mmol/L   Glucose, Bld 105 (H) 70 - 99 mg/dL  Comment: Glucose reference range applies only to samples taken after fasting for at least 8 hours.   BUN 9 8 - 23 mg/dL   Creatinine, Ser 0.72 0.44 - 1.00 mg/dL   Calcium 9.3 8.9 - 10.3 mg/dL   Phosphorus 2.7 2.5 - 4.6 mg/dL   Albumin 2.0 (L) 3.5 - 5.0 g/dL   GFR, Estimated >60 >60 mL/min    Comment: (NOTE) Calculated using the CKD-EPI Creatinine Equation (2021)    Anion gap 10 5 - 15    Comment: Performed at [facility] [address] [place], [place] [address]  Magnesium     Status: None   Collection Time: 07/09/21  3:46 AM  Result Value Ref Range   Magnesium 1.8 1.7 - 2.4 mg/dL    Comment: Performed at [facility] [address] [place], [place] [address]  Potassium     Status: None   Collection Time: 07/10/21  4:15 AM  Result Value  Ref Range   Potassium 3.8 3.5 - 5.1 mmol/L    Comment: Performed at [facility] [address] [place], [place] [address]   DG Abd 1 View  Result Date: 07/10/2021 CLINICAL DATA:  Diarrhea. EXAM: ABDOMEN - 1 VIEW COMPARISON:  06/28/2021 FINDINGS: Gas is seen within both nondilated small bowel loops and colon. IMPRESSION: Nonspecific, nonobstructive bowel gas pattern. Electronically Signed   By: Marlaine Hind M.D.   On: 07/10/2021 19:31       Medical Problem List and Plan: 1.  Deficits with mobility, endurance, self-care secondary to debility  -patient may not shower  -ELOS/Goals: 15-18 days/Supervision/Min A  Admit to CIR 2.  Antithrombotics: -DVT/anticoagulation:  Pharmaceutical: Lovenox  -antiplatelet therapy: Plavix 3. Pain Management: Oxycodone prn  --continue Cymbalta and Gabapentin.   Monitor with increased exertion 4. Mood: LCSW to follow for evaluation and support.   -antipsychotic agents: N/a 5. Neuropsych: This patient is capable of making decisions on her own behalf. 6. Skin/Wound Care: Routine pressure relief measure. Foam dressing to sacrum 7. Fluids/Electrolytes/Nutrition: Intake poor --does not like the food here.  --discussed food choices--will liberalize to regular for flavor.  --Advised husband to bring supplements/food from home 8. MSSA bacteremia w/sepsis: On Zosyn--> will consult ID for input  --WBC continues to fluctuate. Sed rate/CRP trending down 9. Diskitis: Recommendations are to repeat films next week for follow up  --will consult ID for eval/input 10. COPD/Pulmonary septic emboli: On Zosyn-encourage pulmonary toilet with flutter valve  --continue Incurse inhaler daily  Wean supplemental oxygen as tolerated 11. Gout flare: On prednisone D#4--taper to 20  mg/day on 09/01.  12.. Hypokalemia: Resolved with supplement yesterday. Add supplement as Lasix on board. CMP ordered for tomorrow 13. Neurogenic bladder v/s overflow: Foley d/c 08/28 and she  has not been able to control her bladder  UA/Ucx ordered to rule out candida   --Monitor voiding with PVR checks. Toilet patient every 3 hours 14. Diarrhea w/incontinence/Neurogenic bowel?: Goes after she eats as well as prn  --discontinue Miralax --KUB ordered to check stool burden v/s need for program v/s need for bulking.   Bary Leriche, PA-C 07/10/2021   I have personally performed a face to face diagnostic evaluation, including, but not limited to relevant history and physical exam findings, of this patient and developed relevant assessment and plan.  Additionally, I have reviewed and concur with the physician assistant's documentation above.  Delice Lesch, MD, ABPMR

## 2021-07-10 ENCOUNTER — Inpatient Hospital Stay (HOSPITAL_COMMUNITY)
Admission: RE | Admit: 2021-07-10 | Discharge: 2021-08-01 | DRG: 945 | Disposition: A | Discharge status: Home Health | Payer: Medicare Other | Source: Other Acute Inpatient Hospital | Attending: Physical Medicine and Rehabilitation | Admitting: Physical Medicine and Rehabilitation

## 2021-07-10 ENCOUNTER — Other Ambulatory Visit: Payer: Self-pay

## 2021-07-10 ENCOUNTER — Inpatient Hospital Stay (HOSPITAL_COMMUNITY): Payer: Medicare Other

## 2021-07-10 ENCOUNTER — Encounter (HOSPITAL_COMMUNITY): Payer: Self-pay | Admitting: Physical Medicine & Rehabilitation

## 2021-07-10 DIAGNOSIS — R197 Diarrhea, unspecified: Secondary | ICD-10-CM

## 2021-07-10 DIAGNOSIS — R300 Dysuria: Secondary | ICD-10-CM | POA: Diagnosis not present

## 2021-07-10 DIAGNOSIS — N319 Neuromuscular dysfunction of bladder, unspecified: Secondary | ICD-10-CM | POA: Diagnosis present

## 2021-07-10 DIAGNOSIS — R29898 Other symptoms and signs involving the musculoskeletal system: Secondary | ICD-10-CM | POA: Diagnosis not present

## 2021-07-10 DIAGNOSIS — B9561 Methicillin susceptible Staphylococcus aureus infection as the cause of diseases classified elsewhere: Secondary | ICD-10-CM | POA: Diagnosis present

## 2021-07-10 DIAGNOSIS — R7881 Bacteremia: Secondary | ICD-10-CM | POA: Diagnosis present

## 2021-07-10 DIAGNOSIS — Z888 Allergy status to other drugs, medicaments and biological substances status: Secondary | ICD-10-CM

## 2021-07-10 DIAGNOSIS — M109 Gout, unspecified: Secondary | ICD-10-CM | POA: Diagnosis present

## 2021-07-10 DIAGNOSIS — Z6830 Body mass index (BMI) 30.0-30.9, adult: Secondary | ICD-10-CM

## 2021-07-10 DIAGNOSIS — Z801 Family history of malignant neoplasm of trachea, bronchus and lung: Secondary | ICD-10-CM

## 2021-07-10 DIAGNOSIS — R5381 Other malaise: Secondary | ICD-10-CM | POA: Diagnosis present

## 2021-07-10 DIAGNOSIS — E871 Hypo-osmolality and hyponatremia: Secondary | ICD-10-CM | POA: Diagnosis present

## 2021-07-10 DIAGNOSIS — T380X5A Adverse effect of glucocorticoids and synthetic analogues, initial encounter: Secondary | ICD-10-CM | POA: Diagnosis not present

## 2021-07-10 DIAGNOSIS — Z8249 Family history of ischemic heart disease and other diseases of the circulatory system: Secondary | ICD-10-CM

## 2021-07-10 DIAGNOSIS — M4626 Osteomyelitis of vertebra, lumbar region: Secondary | ICD-10-CM | POA: Diagnosis present

## 2021-07-10 DIAGNOSIS — M79671 Pain in right foot: Secondary | ICD-10-CM | POA: Diagnosis not present

## 2021-07-10 DIAGNOSIS — F418 Other specified anxiety disorders: Secondary | ICD-10-CM

## 2021-07-10 DIAGNOSIS — E785 Hyperlipidemia, unspecified: Secondary | ICD-10-CM | POA: Diagnosis present

## 2021-07-10 DIAGNOSIS — I517 Cardiomegaly: Secondary | ICD-10-CM | POA: Diagnosis not present

## 2021-07-10 DIAGNOSIS — N951 Menopausal and female climacteric states: Secondary | ICD-10-CM | POA: Diagnosis present

## 2021-07-10 DIAGNOSIS — I2510 Atherosclerotic heart disease of native coronary artery without angina pectoris: Secondary | ICD-10-CM | POA: Diagnosis present

## 2021-07-10 DIAGNOSIS — Z8674 Personal history of sudden cardiac arrest: Secondary | ICD-10-CM

## 2021-07-10 DIAGNOSIS — Y92239 Unspecified place in hospital as the place of occurrence of the external cause: Secondary | ICD-10-CM | POA: Diagnosis not present

## 2021-07-10 DIAGNOSIS — Z885 Allergy status to narcotic agent status: Secondary | ICD-10-CM

## 2021-07-10 DIAGNOSIS — L539 Erythematous condition, unspecified: Secondary | ICD-10-CM | POA: Diagnosis not present

## 2021-07-10 DIAGNOSIS — E669 Obesity, unspecified: Secondary | ICD-10-CM | POA: Diagnosis present

## 2021-07-10 DIAGNOSIS — I252 Old myocardial infarction: Secondary | ICD-10-CM | POA: Diagnosis not present

## 2021-07-10 DIAGNOSIS — Z713 Dietary counseling and surveillance: Secondary | ICD-10-CM

## 2021-07-10 DIAGNOSIS — R945 Abnormal results of liver function studies: Secondary | ICD-10-CM

## 2021-07-10 DIAGNOSIS — Z91013 Allergy to seafood: Secondary | ICD-10-CM

## 2021-07-10 DIAGNOSIS — R159 Full incontinence of feces: Secondary | ICD-10-CM

## 2021-07-10 DIAGNOSIS — R7989 Other specified abnormal findings of blood chemistry: Secondary | ICD-10-CM | POA: Diagnosis present

## 2021-07-10 DIAGNOSIS — I2690 Septic pulmonary embolism without acute cor pulmonale: Secondary | ICD-10-CM | POA: Diagnosis not present

## 2021-07-10 DIAGNOSIS — R739 Hyperglycemia, unspecified: Secondary | ICD-10-CM | POA: Diagnosis not present

## 2021-07-10 DIAGNOSIS — R0602 Shortness of breath: Secondary | ICD-10-CM | POA: Diagnosis not present

## 2021-07-10 DIAGNOSIS — R195 Other fecal abnormalities: Secondary | ICD-10-CM

## 2021-07-10 DIAGNOSIS — E876 Hypokalemia: Secondary | ICD-10-CM | POA: Diagnosis not present

## 2021-07-10 DIAGNOSIS — R062 Wheezing: Secondary | ICD-10-CM | POA: Diagnosis not present

## 2021-07-10 DIAGNOSIS — I079 Rheumatic tricuspid valve disease, unspecified: Secondary | ICD-10-CM | POA: Diagnosis present

## 2021-07-10 DIAGNOSIS — Z9981 Dependence on supplemental oxygen: Secondary | ICD-10-CM | POA: Diagnosis not present

## 2021-07-10 DIAGNOSIS — M62838 Other muscle spasm: Secondary | ICD-10-CM | POA: Diagnosis present

## 2021-07-10 DIAGNOSIS — E8809 Other disorders of plasma-protein metabolism, not elsewhere classified: Secondary | ICD-10-CM | POA: Diagnosis present

## 2021-07-10 DIAGNOSIS — J449 Chronic obstructive pulmonary disease, unspecified: Secondary | ICD-10-CM | POA: Diagnosis present

## 2021-07-10 DIAGNOSIS — Z79899 Other long term (current) drug therapy: Secondary | ICD-10-CM | POA: Diagnosis not present

## 2021-07-10 DIAGNOSIS — F419 Anxiety disorder, unspecified: Secondary | ICD-10-CM | POA: Diagnosis present

## 2021-07-10 DIAGNOSIS — D638 Anemia in other chronic diseases classified elsewhere: Secondary | ICD-10-CM | POA: Diagnosis present

## 2021-07-10 DIAGNOSIS — Z823 Family history of stroke: Secondary | ICD-10-CM

## 2021-07-10 DIAGNOSIS — T502X5A Adverse effect of carbonic-anhydrase inhibitors, benzothiadiazides and other diuretics, initial encounter: Secondary | ICD-10-CM | POA: Diagnosis not present

## 2021-07-10 DIAGNOSIS — Z955 Presence of coronary angioplasty implant and graft: Secondary | ICD-10-CM | POA: Diagnosis not present

## 2021-07-10 DIAGNOSIS — R059 Cough, unspecified: Secondary | ICD-10-CM | POA: Diagnosis not present

## 2021-07-10 DIAGNOSIS — I76 Septic arterial embolism: Secondary | ICD-10-CM | POA: Diagnosis present

## 2021-07-10 DIAGNOSIS — M4646 Discitis, unspecified, lumbar region: Secondary | ICD-10-CM | POA: Diagnosis present

## 2021-07-10 DIAGNOSIS — Z8711 Personal history of peptic ulcer disease: Secondary | ICD-10-CM

## 2021-07-10 DIAGNOSIS — I10 Essential (primary) hypertension: Secondary | ICD-10-CM | POA: Diagnosis present

## 2021-07-10 DIAGNOSIS — D72829 Elevated white blood cell count, unspecified: Secondary | ICD-10-CM | POA: Diagnosis not present

## 2021-07-10 DIAGNOSIS — G629 Polyneuropathy, unspecified: Secondary | ICD-10-CM | POA: Diagnosis present

## 2021-07-10 DIAGNOSIS — Z87891 Personal history of nicotine dependence: Secondary | ICD-10-CM

## 2021-07-10 DIAGNOSIS — D72828 Other elevated white blood cell count: Secondary | ICD-10-CM | POA: Diagnosis not present

## 2021-07-10 DIAGNOSIS — G8929 Other chronic pain: Secondary | ICD-10-CM | POA: Diagnosis present

## 2021-07-10 DIAGNOSIS — K219 Gastro-esophageal reflux disease without esophagitis: Secondary | ICD-10-CM | POA: Diagnosis present

## 2021-07-10 DIAGNOSIS — M1000 Idiopathic gout, unspecified site: Secondary | ICD-10-CM | POA: Diagnosis not present

## 2021-07-10 DIAGNOSIS — R32 Unspecified urinary incontinence: Secondary | ICD-10-CM | POA: Diagnosis present

## 2021-07-10 DIAGNOSIS — G8918 Other acute postprocedural pain: Secondary | ICD-10-CM

## 2021-07-10 LAB — GLUCOSE, CAPILLARY
Glucose-Capillary: 135 mg/dL — ABNORMAL HIGH (ref 70–99)
Glucose-Capillary: 145 mg/dL — ABNORMAL HIGH (ref 70–99)

## 2021-07-10 LAB — POTASSIUM: Potassium: 3.8 mmol/L (ref 3.5–5.1)

## 2021-07-10 IMAGING — DX DG ABDOMEN 1V
1 series · 2 of 2 positions shown · non-contrast
Comparison: [DATE]

CLINICAL DATA: Diarrhea.

EXAM:
ABDOMEN - 1 VIEW

[Series 1: abdomen · 0.14mm/px · 2 of 2 slices shown]
[im 1/2]
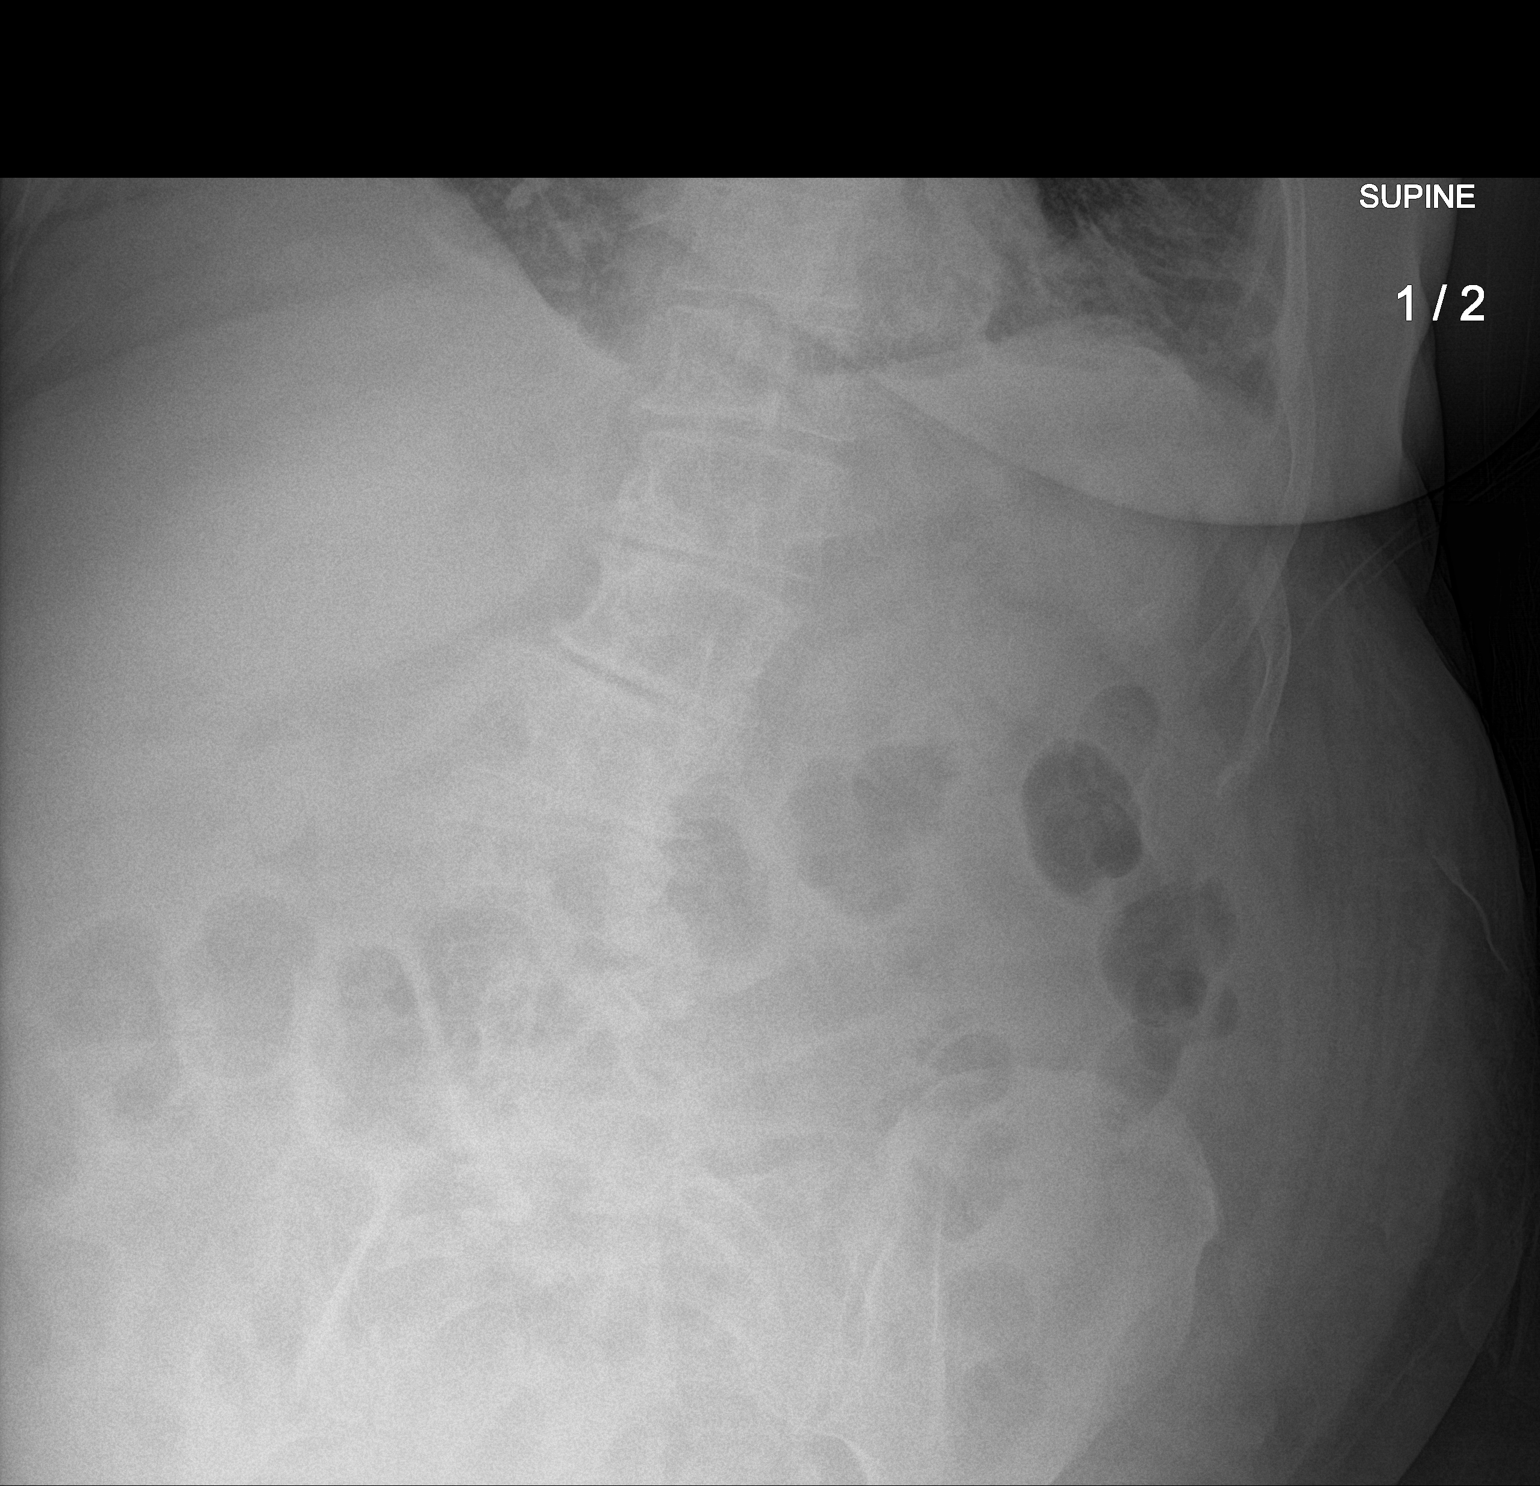
[im 2/2]
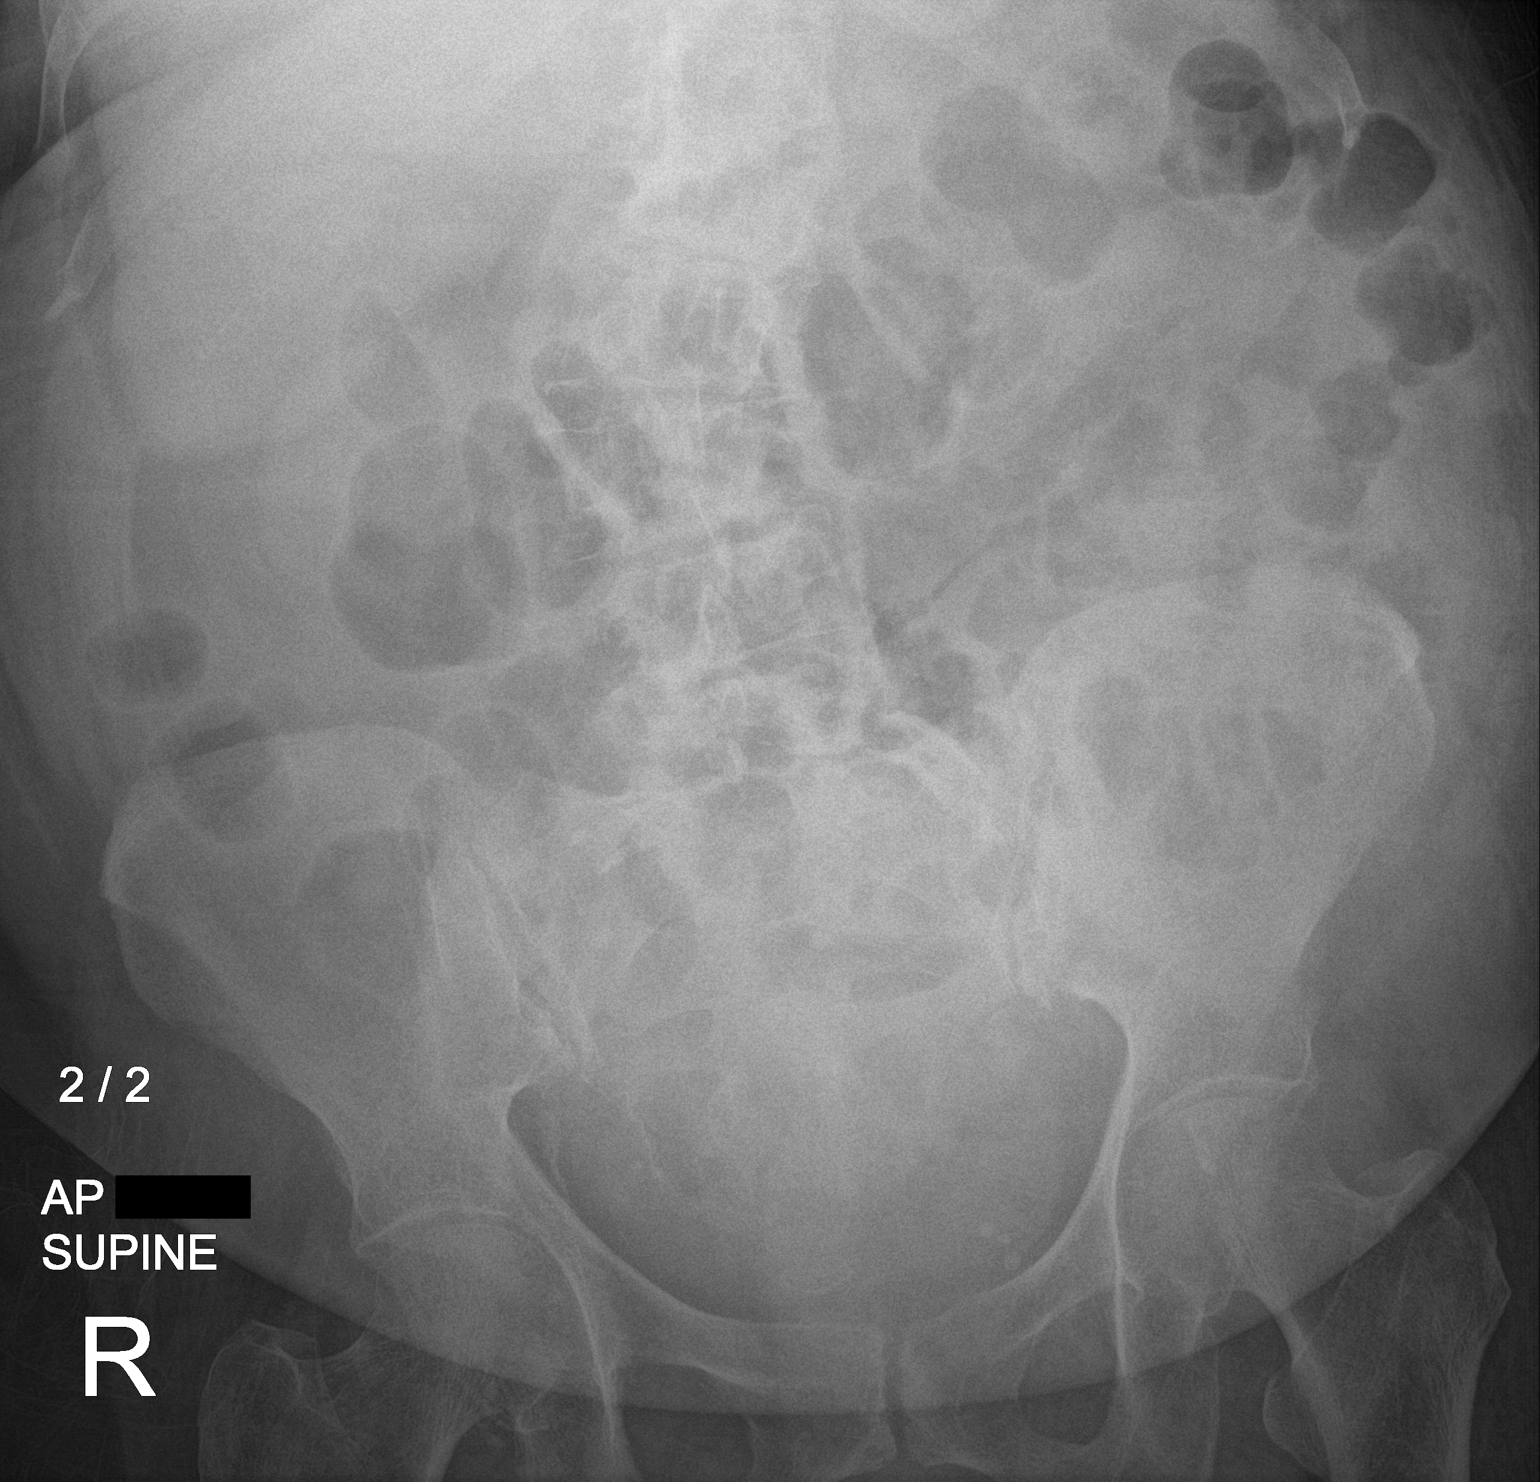

[2 of 2 positions shown; findings below may reference images not displayed]

FINDINGS: Gas is seen within both nondilated small bowel loops and colon.
IMPRESSION: Nonspecific, nonobstructive bowel gas pattern.

## 2021-07-10 MED ORDER — DIPHENHYDRAMINE HCL 12.5 MG/5ML PO ELIX: 12.5000 mg | ORAL_SOLUTION | Freq: Four times a day (QID) | ORAL | Status: DC | PRN

## 2021-07-10 MED ORDER — POLYETHYLENE GLYCOL 3350 17 G PO PACK: 17.0000 g | PACK | Freq: Every day | ORAL | Status: DC | PRN

## 2021-07-10 MED ORDER — PROCHLORPERAZINE EDISYLATE 10 MG/2ML IJ SOLN
5.0000 mg | Freq: Four times a day (QID) | INTRAMUSCULAR | Status: DC | PRN
  Filled 2021-07-10: qty 2

## 2021-07-10 MED ORDER — OXYCODONE HCL 5 MG PO TABS
5.0000 mg | ORAL_TABLET | ORAL | Status: DC | PRN
  Administered 2021-07-10 – 2021-07-17 (×18): 5 mg via ORAL
  Filled 2021-07-10 (×20): qty 1

## 2021-07-10 MED ORDER — ENOXAPARIN SODIUM 40 MG/0.4ML IJ SOSY
40.0000 mg | PREFILLED_SYRINGE | INTRAMUSCULAR | Status: DC
  Administered 2021-07-11 – 2021-08-01 (×22): 40 mg via SUBCUTANEOUS
  Filled 2021-07-10 (×22): qty 0.4

## 2021-07-10 MED ORDER — TRAZODONE HCL 50 MG PO TABS
25.0000 mg | ORAL_TABLET | Freq: Every evening | ORAL | Status: DC | PRN
  Administered 2021-07-13: 25 mg via ORAL
  Filled 2021-07-10 (×2): qty 1

## 2021-07-10 MED ORDER — PROCHLORPERAZINE MALEATE 5 MG PO TABS: 5.0000 mg | ORAL_TABLET | Freq: Four times a day (QID) | ORAL | Status: DC | PRN

## 2021-07-10 MED ORDER — ACETAMINOPHEN 325 MG PO TABS
325.0000 mg | ORAL_TABLET | ORAL | Status: DC | PRN
  Administered 2021-07-10 – 2021-07-15 (×9): 650 mg via ORAL
  Administered 2021-07-15: 325 mg via ORAL
  Administered 2021-07-16 – 2021-08-01 (×23): 650 mg via ORAL
  Filled 2021-07-10 (×35): qty 2

## 2021-07-10 MED ORDER — INSULIN ASPART 100 UNIT/ML IJ SOLN
0.0000 [IU] | Freq: Three times a day (TID) | INTRAMUSCULAR | Status: DC
  Administered 2021-07-10 – 2021-07-11 (×3): 1 [IU] via SUBCUTANEOUS
  Administered 2021-07-12: 2 [IU] via SUBCUTANEOUS
  Administered 2021-07-13 – 2021-07-14 (×4): 1 [IU] via SUBCUTANEOUS
  Administered 2021-07-15: 2 [IU] via SUBCUTANEOUS
  Administered 2021-07-15: 1 [IU] via SUBCUTANEOUS
  Administered 2021-07-16: 2 [IU] via SUBCUTANEOUS
  Administered 2021-07-17 – 2021-07-20 (×4): 1 [IU] via SUBCUTANEOUS
  Administered 2021-07-21 – 2021-07-23 (×2): 2 [IU] via SUBCUTANEOUS
  Administered 2021-07-24 – 2021-07-26 (×3): 1 [IU] via SUBCUTANEOUS
  Administered 2021-07-27: 2 [IU] via SUBCUTANEOUS
  Administered 2021-07-30: 0 [IU] via SUBCUTANEOUS
  Administered 2021-07-31: 1 [IU] via SUBCUTANEOUS

## 2021-07-10 MED ORDER — PROCHLORPERAZINE 25 MG RE SUPP: 12.5000 mg | Freq: Four times a day (QID) | RECTAL | Status: DC | PRN

## 2021-07-10 MED ORDER — ASCORBIC ACID 500 MG PO TABS
500.0000 mg | ORAL_TABLET | Freq: Two times a day (BID) | ORAL | Status: DC
  Administered 2021-07-10 – 2021-08-01 (×44): 500 mg via ORAL
  Filled 2021-07-10 (×44): qty 1

## 2021-07-10 MED ORDER — CHLORHEXIDINE GLUCONATE CLOTH 2 % EX PADS
6.0000 | MEDICATED_PAD | Freq: Every day | CUTANEOUS | Status: DC
  Administered 2021-07-10 – 2021-07-18 (×9): 6 via TOPICAL

## 2021-07-10 MED ORDER — PIPERACILLIN-TAZOBACTAM 3.375 G IVPB
3.3750 g | Freq: Three times a day (TID) | INTRAVENOUS | Status: DC
  Administered 2021-07-10 – 2021-07-11 (×3): 3.375 g via INTRAVENOUS
  Filled 2021-07-10 (×5): qty 50

## 2021-07-10 MED ORDER — SODIUM CHLORIDE 0.9% FLUSH
10.0000 mL | Freq: Two times a day (BID) | INTRAVENOUS | Status: DC
  Administered 2021-07-10 – 2021-07-31 (×30): 10 mL

## 2021-07-10 MED ORDER — GUAIFENESIN-DM 100-10 MG/5ML PO SYRP: 5.0000 mL | ORAL_SOLUTION | Freq: Four times a day (QID) | ORAL | Status: DC | PRN

## 2021-07-10 MED ORDER — JUVEN PO PACK
1.0000 | PACK | Freq: Two times a day (BID) | ORAL | Status: DC
  Filled 2021-07-10 (×2): qty 1

## 2021-07-10 MED ORDER — SODIUM CHLORIDE 0.9% FLUSH
10.0000 mL | INTRAVENOUS | Status: DC | PRN
  Administered 2021-07-19: 10 mL

## 2021-07-10 MED ORDER — PREDNISONE 20 MG PO TABS
20.0000 mg | ORAL_TABLET | Freq: Every day | ORAL | Status: DC
  Administered 2021-07-11 – 2021-07-22 (×12): 20 mg via ORAL
  Filled 2021-07-10 (×13): qty 1

## 2021-07-10 MED ORDER — INSULIN ASPART 100 UNIT/ML IJ SOLN
0.0000 [IU] | Freq: Every day | INTRAMUSCULAR | Status: DC
Start: 2021-07-10 — End: 2021-08-01

## 2021-07-10 MED ORDER — CLOPIDOGREL BISULFATE 75 MG PO TABS
75.0000 mg | ORAL_TABLET | Freq: Every day | ORAL | Status: DC
  Administered 2021-07-11 – 2021-08-01 (×22): 75 mg via ORAL
  Filled 2021-07-10 (×22): qty 1

## 2021-07-10 MED ORDER — BISACODYL 10 MG RE SUPP: 10.0000 mg | Freq: Every day | RECTAL | Status: DC | PRN

## 2021-07-10 MED ORDER — FUROSEMIDE 40 MG PO TABS
40.0000 mg | ORAL_TABLET | Freq: Every day | ORAL | Status: DC
  Administered 2021-07-11 – 2021-08-01 (×22): 40 mg via ORAL
  Filled 2021-07-10 (×22): qty 1

## 2021-07-10 MED ORDER — FLEET ENEMA 7-19 GM/118ML RE ENEM: 1.0000 | ENEMA | Freq: Once | RECTAL | Status: DC | PRN

## 2021-07-10 MED ORDER — ZINC SULFATE 220 (50 ZN) MG PO CAPS
220.0000 mg | ORAL_CAPSULE | Freq: Every day | ORAL | Status: DC
  Administered 2021-07-10 – 2021-08-01 (×23): 220 mg via ORAL
  Filled 2021-07-10 (×23): qty 1

## 2021-07-10 MED ORDER — PANTOPRAZOLE SODIUM 40 MG PO TBEC
40.0000 mg | DELAYED_RELEASE_TABLET | Freq: Every day | ORAL | Status: DC
  Administered 2021-07-11 – 2021-08-01 (×22): 40 mg via ORAL
  Filled 2021-07-10 (×21): qty 1

## 2021-07-10 MED ORDER — LIDOCAINE HCL URETHRAL/MUCOSAL 2 % EX GEL: CUTANEOUS | Status: DC | PRN

## 2021-07-10 MED ORDER — ENSURE ENLIVE PO LIQD
237.0000 mL | Freq: Three times a day (TID) | ORAL | Status: DC
  Administered 2021-07-10 – 2021-07-31 (×40): 237 mL via ORAL

## 2021-07-10 MED ORDER — GABAPENTIN 300 MG PO CAPS
300.0000 mg | ORAL_CAPSULE | Freq: Two times a day (BID) | ORAL | Status: DC
  Administered 2021-07-10 – 2021-08-01 (×44): 300 mg via ORAL
  Filled 2021-07-10 (×44): qty 1

## 2021-07-10 MED ORDER — METOPROLOL TARTRATE 25 MG PO TABS
25.0000 mg | ORAL_TABLET | Freq: Two times a day (BID) | ORAL | Status: DC
  Administered 2021-07-10 – 2021-08-01 (×44): 25 mg via ORAL
  Filled 2021-07-10 (×44): qty 1

## 2021-07-10 MED ORDER — LIDOCAINE 5 % EX PTCH
1.0000 | MEDICATED_PATCH | CUTANEOUS | Status: DC
  Administered 2021-07-11 – 2021-08-01 (×22): 1 via TRANSDERMAL
  Filled 2021-07-10 (×23): qty 1

## 2021-07-10 MED ORDER — POTASSIUM CHLORIDE CRYS ER 20 MEQ PO TBCR
20.0000 meq | EXTENDED_RELEASE_TABLET | Freq: Two times a day (BID) | ORAL | Status: DC
  Administered 2021-07-10 – 2021-08-01 (×44): 20 meq via ORAL
  Filled 2021-07-10 (×8): qty 1
  Filled 2021-07-10: qty 2
  Filled 2021-07-10 (×25): qty 1
  Filled 2021-07-10: qty 2
  Filled 2021-07-10 (×7): qty 1

## 2021-07-10 MED ORDER — UMECLIDINIUM BROMIDE 62.5 MCG/INH IN AEPB
1.0000 | INHALATION_SPRAY | Freq: Every day | RESPIRATORY_TRACT | Status: DC
  Administered 2021-07-11 – 2021-08-01 (×21): 1 via RESPIRATORY_TRACT
  Filled 2021-07-10 (×3): qty 7

## 2021-07-10 MED ORDER — ATORVASTATIN CALCIUM 80 MG PO TABS
80.0000 mg | ORAL_TABLET | Freq: Every day | ORAL | Status: DC
  Administered 2021-07-10 – 2021-07-31 (×22): 80 mg via ORAL
  Filled 2021-07-10 (×23): qty 1

## 2021-07-10 MED ORDER — ALUM & MAG HYDROXIDE-SIMETH 200-200-20 MG/5ML PO SUSP: 30.0000 mL | ORAL | Status: DC | PRN

## 2021-07-10 MED ORDER — DULOXETINE HCL 30 MG PO CPEP
30.0000 mg | ORAL_CAPSULE | Freq: Every day | ORAL | Status: DC
  Administered 2021-07-11 – 2021-07-16 (×6): 30 mg via ORAL
  Filled 2021-07-10 (×6): qty 1

## 2021-07-10 MED ORDER — COLCHICINE 0.6 MG PO TABS
0.6000 mg | ORAL_TABLET | Freq: Every day | ORAL | Status: DC
  Administered 2021-07-11 – 2021-08-01 (×22): 0.6 mg via ORAL
  Filled 2021-07-10 (×22): qty 1

## 2021-07-10 NOTE — H&P (Signed)
Physical Medicine and Rehabilitation Admission H&P    CC: Functional deficits due to disseminated MSSA bacteremia.    HPI: Samantha King. Riebe is a 75 year old female with history of CAD s/p VF arrest, PUD, tobacco abuse who was originally admitted to [facility] on 06/18/2021 with reports of fevers and chills X one week with severe back/ Left knee pain and leucocytosis with WBCs 24/9 due to sepsis from MSSA bacteremia.  History taken from chart review and patient. CT spine showed L4-L5 anterolisthesis with severe canal stenosis, moderate left foraminal stenosis L3-L4. CTA chest/abdomen negative for dissection and showed multiple pulmonary nodules up to 2 cm, NO PE, left adrenal nodules, 6.2 mm non obstructive left renal stone, diverticulosis and aortic atherosclerosis with centrilobular/paraseptal emphysema. ID consulted for input and expressed concerns of vertebral infection/diskitis. Patient started on Cefazolin with recommendations of MRI for work up.    Hospital course significant for decompensation from septic shock on 06/20/2021 with hypoxia, marked hypotension and A fib with RVR. Dr. Cathie Olden consulted for input and she was started on IV heparin, pressors, Cardizem as well as amiodarone for rate control. 2D Echo showed  EF 60-65% with moderate asymmetric LVH and moderate TVR.  Respiratory failure felt to be due to acute diastolic CHF in setting of A fib and respiratory status improve with high flow oxygen and nebs. PNA/pulmonary effusions felt to be due to pulmonary septic emboli with presumed endocarditis  (due to numerous pulmonary opacities) and diuresis limited by AKI.   She did have rise in WBC to 33.5 and repeat BC X 2 are negative. Follow up MRI spine on 08/12 showed severe multifactorial spinal stenosis L4/5 and moderate stenosis L3/4. MRI abdomen done showing benign left adrenal adenomas, LLL PNA. She continued to have severe back pain out of proportion due to chronic changes but developed  extreme sedation with low dose fentanyl. Dr. Baxter Flattery recommended IV Cefazolin  2 gram every 8 hours for 6 weeks with end-date 08/01/21. Pain control was improving with  titration of meds and Ketorolac but she continued to have hypoxia requiring 40-50% HFNC. Therapy was ongoing and patient was limited to bed mobility with inability to lift RUE due to pain, trace movements of BLE and inability to tolerate ROM of any extremities.   She was transferred to [facility] on 08/16 for management and therapy. Respiratory status has improved and she has been weaned down to 4L supplemental oxygen at rest. She started having rise in WBC-19.6 and sed rate-106  and ID consulted for  input. MRI T/L spine done 08/24 revealing interval development of marrow edema bilateral disc space L3/4 suspicious for discitis and osteomyelitis as well as 10-15 ml fluid collection within left psoas suspicious for abscess. IR consulted for aspiration/biopsy but felt that fluid not amenable to aspiration. Antibiotic changed to Zosyn on 08/25 by Dr. Barth Kirks. She developed bilateral foot pain with erythema due to gout flare on 08/28 and was started on prednisone 30  mg X 5 days as well as colcrys with improvement in symptoms. Cognition is back to baseline per husband and she is showing improvement in activity tolerance and participation but continues to be deconditioned. CIR recommended due to functional decline. Please see preadmission assessment from earlier today as well.   Review of Systems  Constitutional:  Negative for chills and fever.  Eyes:  Negative for photophobia and pain.  Respiratory:  Positive for shortness of breath. Negative for cough, sputum production, wheezing and stridor.   Cardiovascular:  Negative for chest pain, palpitations and leg swelling.  Gastrointestinal:  Positive for diarrhea (with incontinence) and heartburn. Negative for constipation and nausea.  Genitourinary:        Incontinent of bladder since foley removed 3 days  ago.   Musculoskeletal:  Positive for myalgias. Negative for back pain and joint pain.  Skin:  Negative for rash.  Neurological:  Positive for sensory change (BLE) and weakness. Negative for dizziness and headaches.  All other systems reviewed and are negative.   Past Medical History:  Diagnosis Date   CAD (coronary artery disease)    a. s/p INF-LAT STEMI => s/p DES-RCA c/b VF arrest and cardiogenic shock   Cardiac arrest ([place]) 10/2012   STEMI with VF arrest x 2    GERD (gastroesophageal reflux disease)    H1N1 influenza 10/2012   Hx of echocardiogram    a. Echo 10/30/12: Moderate LVH, EF 55-60%, normal wall motion, PASP 45   Hyperlipidemia    Hypertension    Myocardial infarction ([place])    Pneumonia 10/2012   a. STEMI c/b LLL pneumonia in setting of recent H1N1 influenza   PUD (peptic ulcer disease)    Syncope 1996   reported episode while driving in [place] in 1996 with full cardiac workup = negative   Tobacco abuse     Past Surgical History:  Procedure Laterality Date   BREAST EXCISIONAL BIOPSY     left breast ? 1981   CORONARY ANGIOPLASTY WITH STENT PLACEMENT     s/p inferior STEMI c/b VF arrest and cardiogenic shock requiring IABP   ESOPHAGOGASTRODUODENOSCOPY (EGD) WITH PROPOFOL N/A 03/10/2020   Procedure: ESOPHAGOGASTRODUODENOSCOPY (EGD) WITH PROPOFOL;  Surgeon: Lavena Bullion, DO;  Location: [place];  Service: Gastroenterology;  Laterality: N/A;   HEMOSTASIS CLIP PLACEMENT  03/10/2020   Procedure: HEMOSTASIS CLIP PLACEMENT;  Surgeon: Lavena Bullion, DO;  Location: [facility];  Service: Gastroenterology;;   HEMOSTASIS CONTROL  03/10/2020   Procedure: HEMOSTASIS CONTROL;  Surgeon: Lavena Bullion, DO;  Location: [facility];  Service: Gastroenterology;;  epi   INTRA-AORTIC BALLOON PUMP INSERTION  10/29/2012   Procedure: INTRA-AORTIC BALLOON PUMP INSERTION;  Surgeon: Burnell Blanks, MD;  Location: [facility] CATH LAB;  Service: Cardiovascular;;   LEFT HEART  CATHETERIZATION WITH CORONARY ANGIOGRAM N/A 10/29/2012   Procedure: LEFT HEART CATHETERIZATION WITH CORONARY ANGIOGRAM;  Surgeon: Burnell Blanks, MD;  Location: [facility] CATH LAB;  Service: Cardiovascular;  Laterality: N/A;   PERCUTANEOUS CORONARY STENT INTERVENTION (PCI-S)  10/29/2012   Procedure: PERCUTANEOUS CORONARY STENT INTERVENTION (PCI-S);  Surgeon: Burnell Blanks, MD;  Location: [facility] CATH LAB;  Service: Cardiovascular;;   TUBAL LIGATION      Family History  Problem Relation Age of Onset   Lung cancer Father    Heart attack Brother    Stroke Maternal Grandmother    Colon cancer Neg Hx    Pancreatic cancer Neg Hx    Prostate cancer Neg Hx    Rectal cancer Neg Hx    Stomach cancer Neg Hx     Social History: Married. 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 She  reports that she quit smoking about 8 years ago. Her smoking use included cigarettes. She has a 20.00 pack-year smoking history. She has never used smokeless tobacco. She reports that she does not drink alcohol and does not use drugs.   Allergies  Allergen Reactions   Shellfish Allergy Nausea And Vomiting   Codeine Other (  See Comments)    Makes me hyperactive and not able to sleep   Lidocaine Palpitations    Medications Prior to Admission  Medication Sig Dispense Refill   acetaminophen (TYLENOL) 325 MG tablet Take 650 mg by mouth every 6 (six) hours as needed for mild pain or headache. For headache or pain     arformoterol (BROVANA) 15 MCG/2ML NEBU Take 2 mLs (15 mcg total) by nebulization 2 (two) times daily. 120 mL    atorvastatin (LIPITOR) 80 MG tablet Take 1 tablet (80 mg total) by mouth daily at 6 PM. 30 tablet 1   ceFAZolin (ANCEF) IVPB Inject 2 g into the vein every 8 (eight) hours. Indication:  Presumed endocarditis First Dose: No Last Day of Therapy:  08/01/21 Labs - Once weekly:  CBC/D and BMP, Labs - Every other week:  ESR and CRP Method of administration: IV  Push Method of administration may be changed at the discretion of home infusion pharmacist based upon assessment of the patient and/or caregiver's ability to self-administer the medication ordered. 111 Units 0   Chlorhexidine Gluconate Cloth 2 % PADS Apply 6 each topically daily.     clonazePAM (KLONOPIN) 0.25 MG disintegrating tablet Take 1 tablet (0.25 mg total) by mouth 2 (two) times daily. 60 tablet 0   clopidogrel (PLAVIX) 75 MG tablet Take 1 tablet (75 mg total) by mouth daily. 90 tablet 3   diclofenac Sodium (VOLTAREN) 1 % GEL Apply 4 g topically 4 (four) times daily.     DULoxetine (CYMBALTA) 30 MG capsule Take 1 capsule (30 mg total) by mouth daily.  3   escitalopram (LEXAPRO) 5 MG tablet Take 5 mg by mouth daily.     furosemide (LASIX) 10 MG/ML injection Inject 4 mLs (40 mg total) into the vein every 6 (six) hours. 4 mL 0   gabapentin (NEURONTIN) 100 MG capsule Take 2 capsules (200 mg total) by mouth 3 (three) times daily.     heparin 5000 UNIT/ML injection Inject 1 mL (5,000 Units total) into the skin every 8 (eight) hours. 1 mL    ketorolac (TORADOL) 15 MG/ML injection Inject 1 mL (15 mg total) into the vein every 8 (eight) hours. 1 mL    lidocaine (LIDODERM) 5 % Place 1 patch onto the skin daily. Remove & Discard patch within 12 hours or as directed by MD 30 patch 0   metoprolol tartrate (LOPRESSOR) 25 MG tablet Take 1 tablet (25 mg total) by mouth 2 (two) times daily. 60 tablet 1   metoprolol tartrate (LOPRESSOR) 5 MG/5ML SOLN injection Inject 2.5-5 mLs (2.5-5 mg total) into the vein every 3 (three) hours as needed (HR 120 & above). 15 mL    ondansetron (ZOFRAN) 4 MG/2ML SOLN injection Inject 2 mLs (4 mg total) into the vein every 6 (six) hours as needed for nausea or vomiting. 2 mL 0   oxyCODONE (OXY IR/ROXICODONE) 5 MG immediate release tablet Take 1 tablet (5 mg total) by mouth every 4 (four) hours as needed for moderate pain. 30 tablet 0   pantoprazole (PROTONIX) 40 MG tablet Take  1 tablet (40 mg total) by mouth daily. 90 tablet 1   potassium chloride SA (KLOR-CON) 20 MEQ tablet Take 2 tablets (40 mEq total) by mouth 2 (two) times daily.     revefenacin (YUPELRI) 175 MCG/3ML nebulizer solution Take 3 mLs (175 mcg total) by nebulization daily. 90 mL    sodium chloride 0.9 % infusion Inject 250 mLs into the vein continuous.  0   sodium chloride flush (NS) 0.9 % SOLN 10-40 mLs by Intracatheter route every 12 (twelve) hours.     sodium chloride flush (NS) 0.9 % SOLN 10-40 mLs by Intracatheter route as needed (flush).      Drug Regimen Review  Drug regimen was reviewed and remains appropriate with no significant issues identified  Home: One  level home with 4 STE    Functional History: Independent and active PTA.  Volunteers and was going out daily.  Functional Status:  Mobility: Min assist for bed mobility Max assist for transers     ADL: Mod assist for grooming Max assist for UB/LB care  Cognition:    There were no vitals taken for this visit. Physical Exam Vitals and nursing note reviewed.  Constitutional:      Appearance: Normal appearance. She is not ill-appearing.  HENT:     Head: Normocephalic and atraumatic.     Right Ear: External ear normal.     Left Ear: External ear normal.     Nose: Nose normal.  Eyes:     General:        Right eye: No discharge.        Left eye: No discharge.     Extraocular Movements: Extraocular movements intact.  Cardiovascular:     Rate and Rhythm: Normal rate and regular rhythm.  Pulmonary:     Effort: Pulmonary effort is normal. No respiratory distress.     Breath sounds: Normal breath sounds. No stridor.     Comments: +[place] Abdominal:     General: There is no distension.     Palpations: Abdomen is soft.  Musculoskeletal:     Cervical back: Normal range of motion and neck supple.     Comments: No edema or tenderness in extremities  Skin:    General: Skin is warm and dry.     Coloration: Skin is not  jaundiced.  Neurological:     Mental Status: She is alert and oriented to person, place, and time.     Comments: Alert Motor: 4-4+/5 throughout Sensation diminished to light touch in b/l toes  Psychiatric:        Mood and Affect: Mood normal.        Behavior: Behavior normal.    Results for orders placed or performed during the hospital encounter of 06/25/21 (from the past 48 hour(s))  CBC     Status: Abnormal   Collection Time: 07/09/21  3:46 AM  Result Value Ref Range   WBC 12.6 (H) 4.0 - 10.5 K/uL   RBC 3.54 (L) 3.87 - 5.11 MIL/uL   Hemoglobin 10.0 (L) 12.0 - 15.0 g/dL   HCT 31.9 (L) 36.0 - 46.0 %   MCV 90.1 80.0 - 100.0 fL   MCH 28.2 26.0 - 34.0 pg   MCHC 31.3 30.0 - 36.0 g/dL   RDW 17.2 (H) 11.5 - 15.5 %   Platelets 538 (H) 150 - 400 K/uL   nRBC 0.0 0.0 - 0.2 %    Comment: Performed at [facility], [address] [place], [place] [address]  Renal function panel     Status: Abnormal   Collection Time: 07/09/21  3:46 AM  Result Value Ref Range   Sodium 137 135 - 145 mmol/L   Potassium 3.3 (L) 3.5 - 5.1 mmol/L   Chloride 98 98 - 111 mmol/L   CO2 29 22 - 32 mmol/L   Glucose, Bld 105 (H) 70 - 99 mg/dL  Comment: Glucose reference range applies only to samples taken after fasting for at least 8 hours.   BUN 9 8 - 23 mg/dL   Creatinine, Ser 0.72 0.44 - 1.00 mg/dL   Calcium 9.3 8.9 - 10.3 mg/dL   Phosphorus 2.7 2.5 - 4.6 mg/dL   Albumin 2.0 (L) 3.5 - 5.0 g/dL   GFR, Estimated >60 >60 mL/min    Comment: (NOTE) Calculated using the CKD-EPI Creatinine Equation (2021)    Anion gap 10 5 - 15    Comment: Performed at [facility] [address] [place], [place] [address]  Magnesium     Status: None   Collection Time: 07/09/21  3:46 AM  Result Value Ref Range   Magnesium 1.8 1.7 - 2.4 mg/dL    Comment: Performed at [facility] [address] [place], [place] [address]  Potassium     Status: None   Collection Time: 07/10/21  4:15 AM  Result Value  Ref Range   Potassium 3.8 3.5 - 5.1 mmol/L    Comment: Performed at [facility] [address], [place], [place] [address]   DG Abd 1 View  Result Date: 07/10/2021 CLINICAL DATA:  Diarrhea. EXAM: ABDOMEN - 1 VIEW COMPARISON:  06/28/2021 FINDINGS: Gas is seen within both nondilated small bowel loops and colon. IMPRESSION: Nonspecific, nonobstructive bowel gas pattern. Electronically Signed   By: Marlaine Hind M.D.   On: 07/10/2021 19:31       Medical Problem List and Plan: 1.  Deficits with mobility, endurance, self-care secondary to debility  -patient may not shower  -ELOS/Goals: 15-18 days/Supervision/Min A  Admit to CIR 2.  Antithrombotics: -DVT/anticoagulation:  Pharmaceutical: Lovenox  -antiplatelet therapy: Plavix 3. Pain Management: Oxycodone prn  --continue Cymbalta and Gabapentin.   Monitor with increased exertion 4. Mood: LCSW to follow for evaluation and support.   -antipsychotic agents: N/a 5. Neuropsych: This patient is capable of making decisions on her own behalf. 6. Skin/Wound Care: Routine pressure relief measure. Foam dressing to sacrum 7. Fluids/Electrolytes/Nutrition: Intake poor --does not like the food here.  --discussed food choices--will liberalize to regular for flavor.  --Advised husband to bring supplements/food from home 8. MSSA bacteremia w/sepsis: On Zosyn--> will consult ID for input  --WBC continues to fluctuate. Sed rate/CRP trending down 9. Diskitis: Recommendations are to repeat films next week for follow up  --will consult ID for eval/input 10. COPD/Pulmonary septic emboli: On Zosyn-encourage pulmonary toilet with flutter valve  --continue Incurse inhaler daily  Wean supplemental oxygen as tolerated 11. Gout flare: On prednisone D#4--taper to 20  mg/day on 09/01.  12.. Hypokalemia: Resolved with supplement yesterday. Add supplement as Lasix on board. CMP ordered for tomorrow 13. Neurogenic bladder v/s overflow: Foley d/c 08/28 and she  has not been able to control her bladder  UA/Ucx ordered to rule out candida   --Monitor voiding with PVR checks. Toilet patient every 3 hours 14. Diarrhea w/incontinence/Neurogenic bowel?: Goes after she eats as well as prn  --discontinue Miralax --KUB ordered to check stool burden v/s need for program v/s need for bulking.   Bary Leriche, PA-C 07/10/2021   I have personally performed a face to face diagnostic evaluation, including, but not limited to relevant history and physical exam findings, of this patient and developed relevant assessment and plan.  Additionally, I have reviewed and concur with the physician assistant's documentation above.  Delice Lesch, MD, ABPMR  The patient's status has not changed. Any changes from  the pre-admission screening or documentation from the acute chart are noted above.   Delice Lesch, MD, ABPMR

## 2021-07-10 NOTE — Progress Notes (Addendum)
Inpatient Rehabilitation Medication Review by a Pharmacist  A complete drug regimen review was completed for this patient to identify any potential clinically significant medication issues.  High Risk Drug Classes Is patient taking? Indication by Medication  Antipsychotic No   Anticoagulant Yes DVT ppx with Enox 40  Antibiotic Yes Zosyn for MSSA bacteremia, last ID note 8/25  Opioid No   Antiplatelet Yes Plavix for CAD  Hypoglycemics/insulin Yes   Vasoactive Medication Yes Metoprolol for CAD  Chemotherapy No   Other No      Type of Medication Issue Identified Description of Issue Recommendation(s)  Drug Interaction(s) (clinically significant)     Duplicate Therapy     Allergy     No Medication Administration End Date  Zosyn IV Q6H per MAR report. Patient has an ID team specified stop date of 9/7. Awaiting MRI scheduled for 9/6 to determine next steps in therapy.   Change Zosyn IV Q8H dosing to Q6H dosing.   Incorrect Dose  Per MAR report from [facility] the following medications are currently dose differently than PTA:  Colchicine 0.6 MG BID per MAR report  Prednisone 30 MG QD per MAR report (Through 9/2) SQ Heparin for DVT ppx per [facility] report (Patient is currently on SQ Enox)   Review current medication for alignment with PTA medication plan.  - Increase colchicine to 0.6 MG BID - Increase prednisone to 30 MG QD  - Determine DVT prophylaxis selection   Additional Drug Therapy Needed  Per MAR report from [facility] the following medications are not currently prescribed:  Voltaren gel PRN for pain relief  Miralax daily  Consider the addition of these PTA medications if clinically applicable.   Significant med changes from prior encounter (inform family/care partners about these prior to discharge).    Other       Clinically significant medication issues were identified that warrant physician communication and completion of prescribed/recommended actions by midnight of the next day:   Yes  Name of provider notified for urgent issues identified: Dr. Naaman Plummer  Provider Method of Notification: Pharmacist follow up on 07/11/21  Pharmacist comments: Please review the above PTA medication review performed using [facility] MAR report.   Time spent performing this drug regimen review (minutes):  30 minutes   Samantha King, PharmD, [facility] Pharmacy Resident [phone] 07/10/2021 8:46 PM   Samantha King, PharmD, BCPS Clinical Staff Pharmacist [email or web address]

## 2021-07-10 NOTE — Progress Notes (Signed)
Pharmacy Antibiotic Note  Samantha King is a 75 y.o. female admitted on 07/10/2021 with MSSA bacteremia, discitis, presumed endocarditis with septic pulm emboli.  Patient was admitted to [facility] inpatient 8/11, discharged to [facility] 8/16, and transferred to inpatient rehab on 8/31.    Pt was initially on Ancef to continue through 10/10 but while at [facility] pt developed leukocytosis and MD switched Ancef to Zosyn.  Plan Zosyn stop date of 9/7.  ID recs MRI spine on 9/6 to see if Zosyn should continue or switch abx back to Ancef to cont thru 10/10.  Last Zosyn dose 8/31 at noon.  Plan: Zosyn 3.375g IV q8h (4 hour infusion). Will not add stop date at this time.  Will follow-up plans for MRI and possibly change abx back to Ancef pending ID recs.     Temp (24hrs), Avg:97.9 F (36.6 C), Min:97.9 F (36.6 C), Max:97.9 F (36.6 C)  Recent Labs  Lab 07/04/21 0326 07/06/21 0432 07/09/21 0346  WBC  --  11.6* 12.6*  CREATININE 0.59 0.67 0.72    CrCl cannot be calculated (Unknown ideal weight.).    Allergies  Allergen Reactions   Shellfish Allergy Nausea And Vomiting   Codeine Other (See Comments)    Makes me hyperactive and not able to sleep   Lidocaine Palpitations    Antimicrobials this admission: Ancef  8/10 >> 8/16 Zosyn  8/16 >>  Dose adjustments this admission:   Microbiology results: 8/20 BCx: neg  8/20 UCx: neg 8/17 UCx: neg 8/11 BCx: neg 8/9 UCx: insig growth 8/9 BCx x 2: MSSA  Thank you for allowing pharmacy to be a part of this patient's care.  [employer], Pharm.D., BCPS Clinical Pharmacist **Pharmacist phone directory can be found on [email or web address] listed under [place].  07/10/2021 5:43 PM

## 2021-07-10 NOTE — Progress Notes (Signed)
PMR Admission Coordinator Pre-Admission Assessment   Patient: Samantha King is an 75 y.o., female MRN: [id number] DOB: 05/07/1946 Height:  _0  Weight:  84.8 kg   Insurance Information HMO:     PPO:      PCP:      IPA:      80/20: yes     OTHER:  PRIMARY:Medicare Part A and B       Policy#: [id number]     Subscriber: Pt.  Medicare       Policy#:       Subscriber: Pt. Phone#: Verified online    Fax#:  Pre-Cert#:       Employer:  Benefits:  Phone #:      Name:  Eff. Date: Parts A ad B effective 11/10/2008  Deduct: $8453      Out of Pocket Max:  None      Life Max: N/A  CIR: 100%      SNF: 100 days Outpatient: 80%     Co-Pay: 20% Home Health: 100%      Co-Pay: none DME: 80%     Co-Pay: 20% Providers: patient's choice SECONDARY: BCBS Supplement      Policy#: [id number]      Phone#:    Financial Counselor:       Phone#:    The "Data Collection Information Summary" for patients in Inpatient Rehabilitation Facilities with attached "Privacy Act [place] Records" was provided and verbally reviewed with: Patient   Emergency Contact Information Contact Information       Name Relation Home Work Mobile    King,Samantha Spouse [phone]        King, Samantha [phone]               Current Medical History  Patient Admitting Diagnosis: Debility, bacteremia, discitis    History of Present Illness: HPI: Samantha King. Samantha King is a 75 year old female with history of CAD s/p VF arrest, PUD, tobacco abuse who was originally admitted to [facility] on 06/18/21 with reports of fevers and chills X one week with severe back/ Left knee pain and leucocytosis with WBC 24.9 due to sepsis from MSSA bacteremia.  CT spine showed L4 on L4 on L5 anterolisthesis with severe canal stenosis, sdxtrocurvature centered at L3/L3, moderate left foraminal stenosis L2-L4. CTA chest/abdomen negative for dissection and showed multiple pulmonary nodules up to 2 cm, NO PE, left adrenal nodules, 6.2  mm non obstructive left renal stone, diverticulosis and aortic atherosclerosis with centrilobular/paraseptal emphysema. ID consulted for input and expressed concerns of vertebral infection/diskitis. Patient started on Cefazolin with recommendations of MRI for work up.     Hospital course significant for decompensation from septic shock on 08/11 with hypoxia, marked hypotension and A fib with RVR. Dr. Cathie Olden consulted for input and she was started on IV heparin, pressors, Cardizem as well as amiodarone for rate control. 2D echo showed  EF 60-65% with moderate asymmetric LVH and moderate TVR.  Respiratory failure felt to be due to acute diastolic CHF in setting of A fib and respiratory status improve with high flow oxygen and nebs. PNA/pulmonary effusions felt to be due to pulmonary septic emboli with presumed endocarditis  (due to numerous pulmonary opacities) and diuresis limited by AKI.    She did have rise in WBC to 33.5 and repeat BC X 2 are negative. Follow up MRI spine on 08/12 showed severe multifactorial spinal stenosis L4/5 and moderate stenosis L3/4. MRI abdomen done showing benign left adrenal adenomas, LLL  PNA. She continued to have severe back pain out of proportion due to chronic changes but developed extreme sedation with low dose fentanyl. Dr. Baxter Flattery recommended Cefazolin 2 gram IV every 8 hours for 6 weeks with end-date 08/01/21. Pain control was improving with  titration of meds and Ketorolac but she continued to have hypoxia requiring 40-50% HFNC. Therapy was ongoing and patient was limited to bed mobility with inability to lift RUE due to pain, trace movements of BLE and inability to tolerate ROM of any extremities.    She was transferred to [facility] on 08/16 for management and therapy. Respiratory status has improved and she has been weaned down to 4 L oxygen at rest. She started having rise in WBC-19.6 and sed rate-106  and ID consulted for  input. MRI T/L spine done 08/24 revealing interval  development of marrow edema bilateral disc space L3/4 suspicious for discitis and osteomyelitis as well as 10-15 ml fluid collection within left psoas suspicious for abscess. IR consulted for aspiration/biopsy but felt that fluid not amenable to aspiration. Antibiotic changed to Zosyn on 08/25 by Dr. Barth Kirks. She developed bilateral foot pain with erythema due to gout flare on 08/28 and was started on steroids as well as colcryis with improvement in symptoms.    Patient's medical record from [facility] has been reviewed by the rehabilitation admission coordinator and physician.   Past Medical History      Past Medical History:  Diagnosis Date   CAD (coronary artery disease)      a. s/p INF-LAT STEMI => s/p DES-RCA c/b VF arrest and cardiogenic shock   Cardiac arrest ([place]) 10/2012    STEMI with VF arrest x 2    GERD (gastroesophageal reflux disease)     H1N1 influenza 10/2012   Hx of echocardiogram      a. Echo 10/30/12: Moderate LVH, EF 55-60%, normal wall motion, PASP 45   Hyperlipidemia     Hypertension     Myocardial infarction ([place])     Pneumonia 10/2012    a. STEMI c/b LLL pneumonia in setting of recent H1N1 influenza   PUD (peptic ulcer disease)     Syncope 1996    reported episode while driving in [place] in 1996 with full cardiac workup = negative   Tobacco abuse        Has the patient had major surgery during 100 days prior to admission? No   Family History   family history includes Heart attack in her brother; Stroke in her maternal grandmother.   Current Medications No current facility-administered medications for this encounter.   Patients Current Diet: Diet Regular    Precautions / Restrictions Weight Bearing Restrictions: No     Has the patient had 2 or more falls or a fall with injury in the past year? No   Prior Activity Level Community (5-7x/wk): Pt. was active in the community PTA     Prior Functional Level Self Care: Did the patient  need help bathing, dressing, using the toilet or eating? Independent   Indoor Mobility: Did the patient need assistance with walking from room to room (with or without device)? Independent   Stairs: Did the patient need assistance with internal or external stairs (with or without device)? Independent   Functional Cognition: Did the patient need help planning regular tasks such as shopping or remembering to take medications? Independent   Patient Information Are you of Hispanic, Latino/a,or Spanish origin?: A. No, not of Hispanic, Latino/a, or Spanish origin  What is your race?: A. White Do you need or want an interpreter to communicate with a doctor or health care staff?: 0. No   Patient's Response To:  Health Literacy and Transportation Is the patient able to respond to health literacy and transportation needs?: No   Home Assistive Devices / Equipment   Prior Device Use: Indicate devices/aids used by the patient prior to current illness, exacerbation or injury? None of the above     Prior Functional Level Current Functional Level  Bed Mobility   Independent Min A  Transfers   Independent Max A   Mobility - Walk/Wheelchair   Independent Not attempted  Upper Body Dressing   Independent Max A  Lower Body Dressing   Independent Max A   Grooming   Independent Mod A  Eating/Drinking   Independent Min A  Toilet Transfer   Independent Not attempted  Bladder Continence    Independent Continent of Bladder  Bowel Management   Independent Continent of Bowel  Stair Climbing   Independent Not attempted  Communication   Independent Independent  Memory   Independent Independent    Special Needs/ Care Considerations Oxygen 4L and Skin Pressure wound to the sacrum   Previous Home Environment (from acute therapy documentation)   Discharge Living Setting Plans for Discharge Living Setting: Patient's home Type of Home at Discharge: House Discharge Home Layout: One level Discharge  Home Access: Stairs to enter Entrance Stairs-Rails: Right Entrance Stairs-Number of Steps: 3-4 Discharge Bathroom Shower/Tub: Tub/shower unit; Walk-in shower Discharge Bathroom Toilet: Standard Discharge Bathroom Accessibility: No     Social/Family/Support Systems Patient Roles: Spouse Contact Information: [phone] Anticipated Caregiver: Dreana Britz Anticipated Caregiver's Contact Information: [phone] Ability/Limitations of Caregiver: can provide min-mod A Caregiver Availability: 24/7 Discharge Plan Discussed with Primary Caregiver: Yes Is Caregiver In Agreement with Plan?: Yes Does Caregiver/Family have Issues with Lodging/Transportation while Pt is in Rehab?: No     Goals Patient/Family Goal for Rehab: PT/OT mod A, SLP mod I Expected length of stay: 18-21 days Pt/Family Agrees to Admission and willing to participate: Yes Program Orientation Provided & Reviewed with Pt/Caregiver Including Roles  & Responsibilities: Yes     Decrease burden of Care through IP rehab admission: Specialzed equipment needs, Decrease number of caregivers, Bowel and bladder program, and Patient/family education   Possible need for SNF placement upon discharge: not anticipated   Patient Condition: I have reviewed medical records from [facility] , spoken with CM, and patient and spouse. I met with patient at the bedside for inpatient rehabilitation assessment.  Patient will benefit from ongoing PT, OT, and SLP, can actively participate in 3 hours of therapy a day 5 days of the week, and can make measurable gains during the admission.  Patient will also benefit from the coordinated team approach during an Inpatient Acute Rehabilitation admission.  The patient will receive intensive therapy as well as Rehabilitation physician, nursing, social worker, and care management interventions.  Due to bladder management, bowel management, safety, skin/wound care, disease management,  medication administration, pain management, and patient education the patient requires 24 hour a day rehabilitation nursing.  The patient is currently max A with mobility and basic ADLs.  Discharge setting and therapy post discharge at home with home health is anticipated.  Patient has agreed to participate in the Acute Inpatient Rehabilitation Program and will admit today.   Preadmission Screen Completed By:  Genella Mech, 07/09/2021 11:15 AM ______________________________________________________________________   Discussed status with Dr. Posey Pronto on [phone]  at 07/10/21 and received approval for admission today.   Admission Coordinator:  Genella Mech, CCC-SLP, time 945/Date 07/10/21    Assessment/Plan: Diagnosis: disseminated MSSA bacteremia Does the need for close, 24 hr/day Medical supervision in concert with the patient's rehab needs make it unreasonable for this patient to be served in a less intensive setting? Yes Co-Morbidities requiring supervision/potential complications: CAD s/p VF arrest, PUD, tobacco abuse  Due to bladder management, bowel management, safety, skin/wound care, disease management, medication administration, and patient education, does the patient require 24 hr/day rehab nursing? Yes Does the patient require coordinated care of a physician, rehab nurse, PT, OT, and SLP to address physical and functional deficits in the context of the above medical diagnosis(es)? Yes Addressing deficits in the following areas: balance, endurance, locomotion, strength, transferring, bathing, dressing, toileting, speech, swallowing, and psychosocial support Can the patient actively participate in an intensive therapy program of at least 3 hrs of therapy 5 days a week? Yes The potential for patient to make measurable gains while on inpatient rehab is excellent Anticipated functional outcomes upon discharge from inpatient rehab: min assist and mod assist PT, min assist and mod assist OT, modified  independent SLP Estimated rehab length of stay to reach the above functional goals is: 14-17 days. Anticipated discharge destination: Home 10. Overall Rehab/Functional Prognosis: good   MD Signature Delice Lesch, MD, ABPMR

## 2021-07-10 NOTE — Progress Notes (Signed)
Admission Note Handoff report received from [place]. Patient arrived upon the unit approximately  1545 accompanied by [place] (x2) and Husband. Alert and oriented x4, able to make needs known with no issues. pleasant mood. Receiving oxygen via [place] at 2/LPM. Denies any pain at this time. Denies SOB. Skin normal to ethnic group and intact. Scattered bruising bilaterally. Skin non-tenting. MASD noted to perineal area and buttocks. Central line noted to right arm with dressing intact. No signs or symptoms of infection noted to central line site. Clear dressing noted to mid upper-back. Patient able to turn with assistance x2 from staff. Patient oriented to unit, assigned nurse, and assigned tech. Bed alarm turned on to ensure patient's safety.  Fluids at bedside, call bell within.

## 2021-07-11 DIAGNOSIS — R7881 Bacteremia: Secondary | ICD-10-CM

## 2021-07-11 DIAGNOSIS — R29898 Other symptoms and signs involving the musculoskeletal system: Secondary | ICD-10-CM | POA: Diagnosis not present

## 2021-07-11 DIAGNOSIS — E876 Hypokalemia: Secondary | ICD-10-CM

## 2021-07-11 DIAGNOSIS — R195 Other fecal abnormalities: Secondary | ICD-10-CM

## 2021-07-11 DIAGNOSIS — B9561 Methicillin susceptible Staphylococcus aureus infection as the cause of diseases classified elsewhere: Secondary | ICD-10-CM

## 2021-07-11 DIAGNOSIS — R5381 Other malaise: Principal | ICD-10-CM

## 2021-07-11 LAB — COMPREHENSIVE METABOLIC PANEL
ALT: 28 U/L (ref 0–44)
AST: 52 U/L — ABNORMAL HIGH (ref 15–41)
Albumin: 2.3 g/dL — ABNORMAL LOW (ref 3.5–5.0)
Alkaline Phosphatase: 112 U/L (ref 38–126)
Anion gap: 8 (ref 5–15)
BUN: 12 mg/dL (ref 8–23)
CO2: 30 mmol/L (ref 22–32)
Calcium: 9.1 mg/dL (ref 8.9–10.3)
Chloride: 100 mmol/L (ref 98–111)
Creatinine, Ser: 0.71 mg/dL (ref 0.44–1.00)
GFR, Estimated: >60 mL/min (ref >60)
Glucose, Bld: 93 mg/dL (ref 70–99)
Potassium: 3 mmol/L — ABNORMAL LOW (ref 3.5–5.1)
Sodium: 138 mmol/L (ref 135–145)
Total Bilirubin: 0.6 mg/dL (ref 0.3–1.2)
Total Protein: 6.1 g/dL — ABNORMAL LOW (ref 6.5–8.1)

## 2021-07-11 LAB — CBC WITH DIFFERENTIAL/PLATELET
Abs Immature Granulocytes: 0.06 10*3/uL (ref 0.00–0.07)
Basophils Absolute: 0 10*3/uL (ref 0.0–0.1)
Basophils Relative: 0 %
Eosinophils Absolute: 0 10*3/uL (ref 0.0–0.5)
Eosinophils Relative: 0 %
HCT: 33.2 % — ABNORMAL LOW (ref 36.0–46.0)
Hemoglobin: 10.5 g/dL — ABNORMAL LOW (ref 12.0–15.0)
Immature Granulocytes: 1 %
Lymphocytes Relative: 20 %
Lymphs Abs: 2.3 10*3/uL (ref 0.7–4.0)
MCH: 28.8 pg (ref 26.0–34.0)
MCHC: 31.6 g/dL (ref 30.0–36.0)
MCV: 91.2 fL (ref 80.0–100.0)
Monocytes Absolute: 1.3 10*3/uL — ABNORMAL HIGH (ref 0.1–1.0)
Monocytes Relative: 11 %
Neutro Abs: 7.9 10*3/uL — ABNORMAL HIGH (ref 1.7–7.7)
Neutrophils Relative %: 68 %
Platelets: 593 10*3/uL — ABNORMAL HIGH (ref 150–400)
RBC: 3.64 MIL/uL — ABNORMAL LOW (ref 3.87–5.11)
RDW: 17.7 % — ABNORMAL HIGH (ref 11.5–15.5)
WBC: 11.7 10*3/uL — ABNORMAL HIGH (ref 4.0–10.5)
nRBC: 0 % (ref 0.0–0.2)

## 2021-07-11 LAB — GLUCOSE, CAPILLARY
Glucose-Capillary: 99 mg/dL (ref 70–99)
Glucose-Capillary: 124 mg/dL — ABNORMAL HIGH (ref 70–99)
Glucose-Capillary: 125 mg/dL — ABNORMAL HIGH (ref 70–99)
Glucose-Capillary: 119 mg/dL — ABNORMAL HIGH (ref 70–99)

## 2021-07-11 LAB — HEMOGLOBIN A1C
Hgb A1c MFr Bld: 6.5 % — ABNORMAL HIGH (ref 4.8–5.6)
Mean Plasma Glucose: 139.85 mg/dL

## 2021-07-11 MED ORDER — METHOCARBAMOL 500 MG PO TABS
500.0000 mg | ORAL_TABLET | Freq: Four times a day (QID) | ORAL | Status: DC | PRN
  Administered 2021-07-11 – 2021-07-18 (×16): 500 mg via ORAL
  Filled 2021-07-11 (×16): qty 1

## 2021-07-11 MED ORDER — CEFAZOLIN SODIUM-DEXTROSE 2-4 GM/100ML-% IV SOLN
2.0000 g | Freq: Three times a day (TID) | INTRAVENOUS | Status: DC
  Administered 2021-07-11 – 2021-08-01 (×64): 2 g via INTRAVENOUS
  Filled 2021-07-11 (×65): qty 100

## 2021-07-11 MED ORDER — PSYLLIUM 95 % PO PACK
1.0000 | PACK | Freq: Every day | ORAL | Status: DC
  Administered 2021-07-11 – 2021-08-01 (×22): 1 via ORAL
  Filled 2021-07-11 (×22): qty 1

## 2021-07-11 MED ORDER — GERHARDT'S BUTT CREAM
TOPICAL_CREAM | Freq: Two times a day (BID) | CUTANEOUS | Status: DC
  Filled 2021-07-11: qty 1

## 2021-07-11 NOTE — Progress Notes (Signed)
Inpatient Rehabilitation  Patient information reviewed and entered into eRehab system by Fontella Shan M. Mathea Frieling, M.A., CCC/SLP, PPS Coordinator.  Information including medical coding, functional ability and quality indicators will be reviewed and updated through discharge.    

## 2021-07-11 NOTE — Evaluation (Signed)
Physical Therapy Assessment and Plan  Patient Details  Name: Samantha King MRN: [id number] Date of Birth: [birth year]  PT Diagnosis: Abnormal posture, Difficulty walking, Impaired sensation, Low back pain, Muscle spasms, and Pain in B feet 2/2 gout Rehab Potential: Good ELOS: 18-21 days   Today's Date: 07/11/2021 PT Individual Time: 0800-0902 PT Individual Time Calculation (min): 62 min    Hospital Problem: Principal Problem:   Debility Active Problems:   Severe muscle deconditioning   MSSA bacteremia   Past Medical History:  Past Medical History:  Diagnosis Date   CAD (coronary artery disease)    a. s/p INF-LAT STEMI => s/p DES-RCA c/b VF arrest and cardiogenic shock   Cardiac arrest ([place]) 10/2012   STEMI with VF arrest x 2    GERD (gastroesophageal reflux disease)    H1N1 influenza 10/2012   Hx of echocardiogram    a. Echo 10/30/12: Moderate LVH, EF 55-60%, normal wall motion, PASP 45   Hyperlipidemia    Hypertension    Myocardial infarction ([place])    Pneumonia 10/2012   a. STEMI c/b LLL pneumonia in setting of recent H1N1 influenza   PUD (peptic ulcer disease)    Syncope 1996   reported episode while driving in [place] in 1996 with full cardiac workup = negative   Tobacco abuse    Past Surgical History:  Past Surgical History:  Procedure Laterality Date   BREAST EXCISIONAL BIOPSY     left breast ? 1981   CORONARY ANGIOPLASTY WITH STENT PLACEMENT     s/p inferior STEMI c/b VF arrest and cardiogenic shock requiring IABP   ESOPHAGOGASTRODUODENOSCOPY (EGD) WITH PROPOFOL N/A 03/10/2020   Procedure: ESOPHAGOGASTRODUODENOSCOPY (EGD) WITH PROPOFOL;  Surgeon: Lavena Bullion, DO;  Location: [facility];  Service: Gastroenterology;  Laterality: N/A;   HEMOSTASIS CLIP PLACEMENT  03/10/2020   Procedure: HEMOSTASIS CLIP PLACEMENT;  Surgeon: Lavena Bullion, DO;  Location: [facility];  Service: Gastroenterology;;   HEMOSTASIS CONTROL  03/10/2020   Procedure: HEMOSTASIS  CONTROL;  Surgeon: Lavena Bullion, DO;  Location: [facility];  Service: Gastroenterology;;  epi   INTRA-AORTIC BALLOON PUMP INSERTION  10/29/2012   Procedure: INTRA-AORTIC BALLOON PUMP INSERTION;  Surgeon: Burnell Blanks, MD;  Location: [facility] CATH LAB;  Service: Cardiovascular;;   LEFT HEART CATHETERIZATION WITH CORONARY ANGIOGRAM N/A 10/29/2012   Procedure: LEFT HEART CATHETERIZATION WITH CORONARY ANGIOGRAM;  Surgeon: Burnell Blanks, MD;  Location: [facility] CATH LAB;  Service: Cardiovascular;  Laterality: N/A;   PERCUTANEOUS CORONARY STENT INTERVENTION (PCI-S)  10/29/2012   Procedure: PERCUTANEOUS CORONARY STENT INTERVENTION (PCI-S);  Surgeon: Burnell Blanks, MD;  Location: [facility] CATH LAB;  Service: Cardiovascular;;   TUBAL LIGATION      Assessment & Plan Clinical Impression: Patient is a 75 y.o. year old female with  history of CAD s/p VF arrest, PUD, tobacco abuse who was originally admitted to [facility] on 06/18/2021 with reports of fevers and chills X one week with severe back/ Left knee pain and leucocytosis with WBCs 24/9 due to sepsis from MSSA bacteremia.  History taken from chart review and patient. CT spine showed L4-L5 anterolisthesis with severe canal stenosis, moderate left foraminal stenosis L3-L4. CTA chest/abdomen negative for dissection and showed multiple pulmonary nodules up to 2 cm, NO PE, left adrenal nodules, 6.2 mm non obstructive left renal stone, diverticulosis and aortic atherosclerosis with centrilobular/paraseptal emphysema. ID consulted for input and expressed concerns of vertebral infection/diskitis. Patient started on Cefazolin with recommendations of MRI for work up.  Hospital course significant for decompensation from septic shock on 06/20/2021 with hypoxia, marked hypotension and A fib with RVR. Dr. Cathie Olden consulted for input and she was started on IV heparin, pressors, Cardizem as well as amiodarone for rate control. 2D Echo showed  EF 60-65% with moderate  asymmetric LVH and moderate TVR.  Respiratory failure felt to be due to acute diastolic CHF in setting of A fib and respiratory status improve with high flow oxygen and nebs. PNA/pulmonary effusions felt to be due to pulmonary septic emboli with presumed endocarditis  (due to numerous pulmonary opacities) and diuresis limited by AKI.    She did have rise in WBC to 33.5 and repeat BC X 2 are negative. Follow up MRI spine on 08/12 showed severe multifactorial spinal stenosis L4/5 and moderate stenosis L3/4. MRI abdomen done showing benign left adrenal adenomas, LLL PNA. She continued to have severe back pain out of proportion due to chronic changes but developed extreme sedation with low dose fentanyl. Dr. Baxter Flattery recommended IV Cefazolin  2 gram every 8 hours for 6 weeks with end-date 08/01/21. Pain control was improving with  titration of meds and Ketorolac but she continued to have hypoxia requiring 40-50% HFNC. Therapy was ongoing and patient was limited to bed mobility with inability to lift RUE due to pain, trace movements of BLE and inability to tolerate ROM of any extremities.    She was transferred to [facility] on 08/16 for management and therapy. Respiratory status has improved and she has been weaned down to 4L supplemental oxygen at rest. She started having rise in WBC-19.6 and sed rate-106  and ID consulted for  input. MRI T/L spine done 08/24 revealing interval development of marrow edema bilateral disc space L3/4 suspicious for discitis and osteomyelitis as well as 10-15 ml fluid collection within left psoas suspicious for abscess. IR consulted for aspiration/biopsy but felt that fluid not amenable to aspiration. Antibiotic changed to Zosyn on 08/25 by Dr. Barth Kirks. She developed bilateral foot pain with erythema due to gout flare on 08/28 and was started on prednisone 30  mg X 5 days as well as colcrys with improvement in symptoms. Cognition is back to baseline per husband and she is showing improvement in  activity tolerance and participation but continues to be deconditioned. CIR recommended due to functional decline. Please see preadmission assessment from earlier today as well.   Patient currently requires max with mobility secondary to muscle weakness, decreased cardiorespiratoy endurance and decreased oxygen support, and decreased sitting balance, decreased standing balance, and decreased postural control.  Prior to hospitalization, patient was independent  with mobility and lived with Spouse (Husband) in a House home.  Home access is 3Stairs to enter.  Patient will benefit from skilled PT intervention to maximize safe functional mobility, minimize fall risk, and decrease caregiver burden for planned discharge home with 24 hour assist.  Anticipate patient will benefit from follow up [facility] at discharge.  PT - End of Session Activity Tolerance: Tolerates 30+ min activity with multiple rests Endurance Deficit: Yes Endurance Deficit Description: Requires frequent rest breaks during functional mobility PT Assessment Rehab Potential (ACUTE/IP ONLY): Good PT Barriers to Discharge: [place] home environment;Neurogenic Bowel & Bladder;Wound Care;Behavior PT Barriers to Discharge Comments: Fear-avoidance behavior PT Patient demonstrates impairments in the following area(s): Balance;Behavior;Endurance;Motor;Pain;Sensory;Skin Integrity PT Transfers Functional Problem(s): Bed Mobility;Car;Furniture;Bed to Chair PT Locomotion Functional Problem(s): Ambulation;Stairs;Wheelchair Mobility PT Plan PT Intensity: Minimum of 1-2 x/day ,45 to 90 minutes PT Frequency: 5 out of 7 days PT Duration  Estimated Length of Stay: 18-21 days PT Treatment/Interventions: Ambulation/gait training;Community reintegration;DME/adaptive equipment instruction;Neuromuscular re-education;Psychosocial support;Stair training;UE/LE Strength taining/ROM;Wheelchair propulsion/positioning;Balance/vestibular training;Discharge  planning;Functional electrical stimulation;Pain management;Therapeutic Activities;Skin care/wound management;UE/LE Coordination activities;Therapeutic Exercise;Patient/family education;Functional mobility training;Disease management/prevention;Cognitive remediation/compensation;Splinting/orthotics PT Transfers Anticipated Outcome(s): Min A PT Locomotion Anticipated Outcome(s): Mod A PT Recommendation Recommendations for Other Services: Neuropsych consult;Therapeutic Recreation consult Therapeutic Recreation Interventions: Pet therapy;Stress management Follow Up Recommendations: Home health PT Patient destination: Home Equipment Recommended: To be determined   PT Evaluation Precautions/Restrictions Precautions Precautions: Fall Restrictions Weight Bearing Restrictions: No Pain Interference Pain Interference Pain Effect on Sleep: 1. Rarely or not at all Pain Interference with Therapy Activities: 4. Almost constantly Pain Interference with Day-to-Day Activities: 4. 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, daycare center at [employer] Comments: Very involved with church activities Vision/Perception  Geologist, engineering: Within Advertising copywriter Praxis Praxis: Intact  Cognition Overall Cognitive Status: Within Functional Limits for tasks assessed Orientation Level: Oriented X4 Safety/Judgment: Appears  intact Sensation Sensation Light Touch: Impaired by gross assessment Coordination Gross Motor Movements are Fluid and Coordinated: No Coordination and Movement Description: Affetced by low back pain/muscle spasms, fear-avoidance behavior, global deconditioning Finger Nose Finger Test: NT Heel Shin Test: NT Motor  Motor Motor: Abnormal tone;Paraplegia Motor - Skilled Clinical Observations: Hypotonia of BLEs   Trunk/Postural Assessment  Cervical Assessment Cervical Assessment: Exceptions to [facility] (Forward head) Thoracic Assessment Thoracic Assessment: Exceptions to [facility] (Kyphotic) Lumbar Assessment Lumbar Assessment: Exceptions to [facility] (Posterior pelvic tilt) Postural Control Postural Control: Deficits on evaluation Head Control: Limited by pain Trunk Control: Trunk lean to L, requires BUE support Protective Responses: Impaired 2/2 pain and global deconditioning  Balance Balance Balance Assessed: Yes Static Sitting Balance Static Sitting - Balance Support: Feet supported;Bilateral upper extremity supported Static Sitting - Level of Assistance: 4: Min assist (Trunk support) Dynamic Sitting Balance Dynamic Sitting - Balance Support: Feet supported;Right upper extremity supported Dynamic Sitting - Level of Assistance: 4: Min assist (Trunk support) Static Standing Balance Static Standing - Balance Support: Bilateral upper extremity supported Static Standing - Level of Assistance: 1: +1 Total assist Extremity Assessment  RLE Assessment RLE Assessment: Exceptions to [facility] General Strength Comments: Grossly 2+-3/5 LLE Assessment LLE Assessment: Exceptions to [facility] General Strength Comments: Grossly 2+-3/5  Care Tool Care Tool Bed Mobility Roll left and right activity   Roll left and right assist level: Supervision/Verbal cueing (Bedrail)    Sit to lying activity   Sit to lying assist level: Moderate Assistance - Patient 50 - 74% (Bedrail, trunk support)    Lying to sitting on  side of bed activity   Lying to sitting on side of bed assist level: the ability to move from lying on the back to sitting on the side of the bed with no back support.: Minimal Assistance - Patient > 75% (Bedrail, trunk support)     Care Tool Transfers Sit to stand transfer   Sit to stand assist level: Total Assistance - Patient < 25% (RW)    Chair/bed transfer Chair/bed transfer activity did not occur: Safety/medical concerns (Pain, fatigue)  Psychologist, counselling transfer activity did not occur: Safety/medical concerns (Pain, fatigue)        Care Tool Locomotion Ambulation Ambulation activity did not occur: Safety/medical concerns (Pain, fatigue)        Walk 10 feet activity Walk 10 feet activity did not occur: Safety/medical concerns (Pain, fatigue)       Walk 50 feet with 2 turns activity Walk 50 feet with 2 turns activity did not occur: Safety/medical concerns (Pain, fatigue)      Walk 150 feet activity Walk 150 feet activity did not occur: Safety/medical concerns (Pain, fatigue)      Walk 10 feet on uneven surfaces activity Walk 10 feet on uneven surfaces activity did not occur: Safety/medical concerns (Pain, fatigue)      Stairs Stair activity did not occur: Safety/medical concerns (Pain, fatigue)        Walk up/down 1 step activity Walk up/down 1 step or curb (drop down) activity did not occur: Safety/medical concerns (Pain, fatigue)     Walk up/down 4 steps activity did not occuR: Safety/medical concerns (Pain, fatigue)  Walk up/down 4 steps activity      Walk up/down 12 steps activity Walk up/down 12 steps activity did not occur: Safety/medical concerns (Pain, fatigue)      Pick up small objects from floor Pick up small object from the floor (from standing position) activity did not occur: Safety/medical concerns (Pain, fatigue)      Wheelchair Is the patient using a wheelchair?: Yes Type of Wheelchair: Manual Wheelchair activity  did not occur: Safety/medical concerns (Pain, fatigue)      Wheel 50 feet with 2 turns activity Wheelchair 50 feet with 2 turns activity did not occur: Safety/medical concerns (Pain, fatigue)    Wheel 150 feet activity Wheelchair 150 feet activity did not occur: Safety/medical concerns (pain, fatigue)      Refer to Care Plan for Long Term Goals  SHORT TERM GOAL WEEK 1 PT Short Term Goal 1 (Week 1): Pt will perform supine <>sit w/min A PT Short Term Goal 2 (Week 1): Pt will perform sit <>stand w/LRAD and mod A PT Short Term Goal 3 (Week 1): Pt will perform bed <>chair transfers mod A w/LRAD PT Short Term Goal 4 (Week 1): Pt will maintain dynamic sitting balance w/no UE support for 2 minutes w/min A  Recommendations for other services: Neuropsych and Therapeutic Recreation  Pet therapy and Stress management  Skilled Therapeutic Intervention Evaluation completed (see details above and below) with education on PT POC, 3hr CIR requirement, goals and individual treatment. Pt received supine in bed, reported pain as 6/10 in low back and nursing present to administer pain meds. Pt rolled L & R w/supervision and use of bedrail. Performed supine <>sit EOB w/mod A for LE management and trunk support, verbal cues for hand placement and sequencing. At EOB, scoot to EOB w/mod A for lateral weightshift and trunk support. Pt verbalized urgency to void, sit EOB <>supine w/min A for LE management. Pt unwilling to let therapist place bedpan under her despite attempts to educate her that it was in the therapist's scope. Nurse notified to place bedpan, pt did not void. Pt rolled w/supervision and therapist removed bedpan. Supine <>sit EOB w/mod A and attempted sit <>stand from elevated bed to RW w/total A x1, verbal cues for hand placement. Pt unable to reach full hip extension and had both hands gripping the legs of the RW w/exaggerated kyphotic posture,  total A for stand <>sit. Pt very anxious and demonstrated  fear-avoidance behavior. Pt performed sit <>supine w/min A and was left supine in bed, all needs in reach, safety plan updated.  Nursing notified of situation.   Mobility Bed Mobility Bed Mobility: Rolling Right;Rolling Left;Sitting - Scoot to Edge of Bed;Supine to Sit;Sit to Supine Rolling Right: Supervision/verbal cueing (Bedrail) Rolling Left: Supervision/Verbal cueing (Bedrail) Supine to Sit: Moderate Assistance - Patient 50-74% Sitting - Scoot to Edge of Bed: Moderate Assistance - Patient 50-74% Sit to Supine: Minimal Assistance - Patient > 75% Transfers Transfers: Sit to Stand;Stand to Sit Sit to Stand: Total Assistance - Patient < 25% Stand to Sit: Total Assistance - Patient < 25% Transfer (Assistive device): Rolling walker Locomotion  Gait Ambulation: No Gait Gait: No Stairs / Additional Locomotion Stairs: No Wheelchair Mobility Wheelchair Mobility: No   Discharge Criteria: Patient will be discharged from PT if patient refuses treatment 3 consecutive times without medical reason, if treatment goals not met, if there is a change in medical status, if patient makes no progress towards goals or if patient is discharged from hospital.  The above assessment, treatment plan, treatment alternatives and goals were discussed and mutually agreed upon: by patient  Cruzita Lederer Samantha King, PT, DPT 07/11/2021, 10:11 AM

## 2021-07-11 NOTE — Consult Note (Addendum)
[facility] Nurse Consult Note: Patient receiving care in [facility] [phone] Reason for Consult: Irritation on the buttocks and groin area Wound type: MASD/IAD in the groin/buttock area Pressure Injury POA: NA Dressing procedure/placement/frequency: This patient currently has Miconazole Nitrate 2% antifungal cream and Calmoseptine ointment at the bedside brought with her from [facility]. I have ordered some Gerhardt's butt cream that may be used. Today I applied the Calmoseptine to the buttock and groin area until the Gerhardt's arrives from the pharmacy. Clean the sacral and groin area thoroughly with soap and water, rinse and pat dry then apply the Gerhardt's but cream to the area twice daily or PRN soiling.   ICD-10 CM Codes for Irritant Dermatitis L24A2 - Due to fecal, urinary or dual incontinence  Monitor the wound area(s) for worsening of condition such as: Signs/symptoms of infection, increase in size, development of or worsening of odor, development of pain, or increased pain at the affected locations.   Notify the medical team if any of these develop.  Thank you for the consult. [facility] nurse will not follow at this time.   Please re-consult the [facility] team if needed.  Cathlean Marseilles Tamala Julian, MSN, RN, [place], [place], [facility] Wound Treatment Associate Pager [phone]  c

## 2021-07-11 NOTE — Evaluation (Signed)
Speech Language Pathology Assessment and Plan  Patient Details  Name: Samantha King MRN: [id number] Date of Birth: 1946/07/30  SLP Diagnosis:    Rehab Potential:   ELOS:      Today's Date: 07/11/2021 SLP Individual Time: 0915-1000 SLP Individual Time Calculation (min): 53 min   Hospital Problem: Principal Problem:   Debility Active Problems:   Severe muscle deconditioning   MSSA bacteremia  Past Medical History:  Past Medical History:  Diagnosis Date   CAD (coronary artery disease)    a. s/p INF-LAT STEMI => s/p DES-RCA c/b VF arrest and cardiogenic shock   Cardiac arrest ([place]) 10/2012   STEMI with VF arrest x 2    GERD (gastroesophageal reflux disease)    H1N1 influenza 10/2012   Hx of echocardiogram    a. Echo 10/30/12: Moderate LVH, EF 55-60%, normal wall motion, PASP 45   Hyperlipidemia    Hypertension    Myocardial infarction ([place])    Pneumonia 10/2012   a. STEMI c/b LLL pneumonia in setting of recent H1N1 influenza   PUD (peptic ulcer disease)    Syncope 1996   reported episode while driving in [place] in 1996 with full cardiac workup = negative   Tobacco abuse    Past Surgical History:  Past Surgical History:  Procedure Laterality Date   BREAST EXCISIONAL BIOPSY     left breast ? 1981   CORONARY ANGIOPLASTY WITH STENT PLACEMENT     s/p inferior STEMI c/b VF arrest and cardiogenic shock requiring IABP   ESOPHAGOGASTRODUODENOSCOPY (EGD) WITH PROPOFOL N/A 03/10/2020   Procedure: ESOPHAGOGASTRODUODENOSCOPY (EGD) WITH PROPOFOL;  Surgeon: Lavena Bullion, DO;  Location: [place];  Service: Gastroenterology;  Laterality: N/A;   HEMOSTASIS CLIP PLACEMENT  03/10/2020   Procedure: HEMOSTASIS CLIP PLACEMENT;  Surgeon: Lavena Bullion, DO;  Location: [facility];  Service: Gastroenterology;;   HEMOSTASIS CONTROL  03/10/2020   Procedure: HEMOSTASIS CONTROL;  Surgeon: Lavena Bullion, DO;  Location: [facility];  Service: Gastroenterology;;  epi    INTRA-AORTIC BALLOON PUMP INSERTION  10/29/2012   Procedure: INTRA-AORTIC BALLOON PUMP INSERTION;  Surgeon: Burnell Blanks, MD;  Location: [facility] CATH LAB;  Service: Cardiovascular;;   LEFT HEART CATHETERIZATION WITH CORONARY ANGIOGRAM N/A 10/29/2012   Procedure: LEFT HEART CATHETERIZATION WITH CORONARY ANGIOGRAM;  Surgeon: Burnell Blanks, MD;  Location: [facility] CATH LAB;  Service: Cardiovascular;  Laterality: N/A;   PERCUTANEOUS CORONARY STENT INTERVENTION (PCI-S)  10/29/2012   Procedure: PERCUTANEOUS CORONARY STENT INTERVENTION (PCI-S);  Surgeon: Burnell Blanks, MD;  Location: [facility] CATH LAB;  Service: Cardiovascular;;   TUBAL LIGATION      Assessment / Plan / Recommendation Clinical Impression Patient  is a 75 year old female with history of CAD s/p VF arrest, PUD, tobacco abuse who was originally admitted to [facility] on 06/18/2021 with reports of fevers and chills with severe back/ Left knee pain and leucocytosis due to sepsis from MSSA bacteremia. CT spine showed L4-L5 anterolisthesis with severe canal stenosis, moderate left foraminal stenosis L3-L4. CTA chest/abdomen negative for dissection and showed multiple pulmonary nodules up to 2 cm, NO PE, left adrenal nodules, 6.2 mm non obstructive left renal stone, diverticulosis and aortic atherosclerosis with centrilobular/paraseptal emphysema. ID consulted for input and expressed concerns of vertebral infection/diskitis. Patient started on Cefazolin with recommendations of MRI for work up.  Hospital course significant for decompensation from septic shock on 06/20/2021 with hypoxia, marked hypotension and A fib with RVR.  2D Echo showed  EF 60-65% with moderate  asymmetric LVH and moderate TVR.  Respiratory failure felt to be due to acute diastolic CHF in setting of A fib and respiratory status improve with high flow oxygen and nebs. PNA/pulmonary effusions felt to be due to pulmonary septic emboli with presumed endocarditis  (due to numerous  pulmonary opacities) and diuresis limited by AKI. Follow up MRI spine on 08/12 showed severe multifactorial spinal stenosis L4/5 and moderate stenosis L3/4. Therapy was ongoing and patient was limited to bed mobility with inability to lift RUE due to pain, trace movements of BLE and inability to tolerate ROM of any extremities. She was transferred to [facility] on 08/16 for management and therapy. Respiratory status has improved and she has been weaned down to 4L supplemental oxygen at rest. MRI T/L spine done 08/24 revealing interval development of marrow edema bilateral disc space L3/4 suspicious for discitis and osteomyelitis as well as 10-15 ml fluid collection within left psoas suspicious for abscess. She developed bilateral foot pain with erythema due to gout flare. Cognition is back to baseline per husband and she is showing improvement in activity tolerance and participation but continues to be deconditioned. CIR recommended due to functional decline. Patient admitted 07/26/21.  Patient's overall receptive and expressive language appear [facility] for all tasks assessed. Patient is 100% intelligible and verbally communicated the sentence and paragraph level. SLP administered the [facility] Mental Status Examination (SLUMS). Patient scored  28/30 points with a score of 27 or above considered normal. Both the patient and her husband endorse that the patient is at her cognitive-linguistic baseline. Patient also consumed a snack of regular textures with thin liquids via straw. Patient without overt s/s of aspiration but did have xerostomia resulting in mild oral residue that patient independently cleared with a liquid wash. Patient also reports slow PO intake at baseline. Patient with intermittent conversation while masticating without shortness of breath. Recommend patient continue current diet of regular textures with thin liquids. Due to patient being at her swallowing and cognitive baseline, skilled SLP  intervention is not warranted at this time. Both the patient and her husband verbalized understanding and agreement.      Skilled Therapeutic Interventions          Administered a cognitive-linguistic evaluation and BSE, please see above for details.   SLP Assessment  Patient does not need any further Speech Lanaguage Pathology Services    Recommendations  SLP Diet Recommendations: Age appropriate regular solids;Thin Liquid Administration via: Straw Medication Administration: Whole meds with liquid Supervision: Patient able to self feed Compensations: Minimize environmental distractions;Slow rate;Small sips/bites Postural Changes and/or Swallow Maneuvers: Seated upright 90 degrees Oral Care Recommendations: Oral care BID Recommendations for Other Services: Neuropsych consult Patient destination: Home Follow up Recommendations: None    SLP Frequency  N/A   SLP Duration  SLP Intensity  SLP Treatment/Interventions  N/A   N/A    N/A   Pain Pain Assessment Pain Score: 2  Faces Pain Scale: Hurts a little bit PAINAD (Pain Assessment in Advanced Dementia) Breathing: occasional labored breathing, short period of hyperventilation Negative Vocalization: none Facial Expression: smiling or inexpressive Body Language: relaxed  Prior Functioning Type of Home: House  Lives With: Spouse (Husband) Available Help at Discharge: Family;Available 24 hours/day Vocation: Psychologist, occupational work  Programmer, systems Overall Cognitive Status: Within Functional Limits for tasks assessed Arousal/Alertness: Awake/alert Orientation Level: Oriented X4 Memory: Appears intact Awareness: Appears intact Problem Solving: Appears intact Safety/Judgment: Appears intact  Comprehension Auditory Comprehension Overall Auditory Comprehension: Appears within functional limits  for tasks assessed Visual Recognition/Discrimination Discrimination: Not tested Reading Comprehension Reading Status: Not  tested Expression Expression Primary Mode of Expression: Verbal Verbal Expression Overall Verbal Expression: Appears within functional limits for tasks assessed Written Expression Dominant Hand: Right Written Expression: Not tested Oral Motor Oral Motor/Sensory Function Overall Oral Motor/Sensory Function: Within functional limits Motor Speech Overall Motor Speech: Appears within functional limits for tasks assessed  Care Tool Care Tool Cognition Ability to hear (with hearing aid or hearing appliances if normally used Ability to hear (with hearing aid or hearing appliances if normally used): 0. Adequate - no difficulty in normal conservation, social interaction, listening to TV   Expression of Ideas and Wants Expression of Ideas and Wants: 4. Without difficulty (complex and basic) - expresses complex messages without difficulty and with speech that is clear and easy to understand   Understanding Verbal and Non-Verbal Content Understanding Verbal and Non-Verbal Content: 4. Understands (complex and basic) - clear comprehension without cues or repetitions  Memory/Recall Ability Memory/Recall Ability : Current season;That he or she is in a hospital/hospital unit;Location of own room    Bedside Swallowing Assessment General Date of Onset: 06/18/21 Previous Swallow Assessment: BSE while on select, placed on Dys. 3 textures with thin liquids but had since been upgraded to regular textures Diet Prior to this Study: Regular;Thin liquids Temperature Spikes Noted: No Respiratory Status: Supplemental O2 delivered via (comment) (2L) History of Recent Intubation: No Behavior/Cognition: Alert;Cooperative Oral Cavity - Dentition: Adequate natural dentition Self-Feeding Abilities: Able to feed self Patient Positioning: Upright in bed Baseline Vocal Quality: Normal Volitional Cough: Strong Volitional Swallow: Able to elicit   Ice Chips Ice chips: Not tested Thin Liquid Thin Liquid: Within  functional limits Presentation: Straw;Self Fed Nectar Thick Nectar Thick Liquid: Not tested Honey Thick Honey Thick Liquid: Not tested Puree Not Tested Solid Solid: Within functional limits Presentation: Self Fed;Spoon BSE Assessment Risk for Aspiration Impact on safety and function: Mild aspiration risk Other Related Risk Factors: Decreased respiratory status;Deconditioning (positioning on air mattress)  Short Term Goals: N/A   Refer to Care Plan for Long Term Goals  Recommendations for other services: Neuropsych  Discharge Criteria: Patient will be discharged from SLP if patient refuses treatment 3 consecutive times without medical reason, if treatment goals not met, if there is a change in medical status, if patient makes no progress towards goals or if patient is discharged from hospital.  The above assessment, treatment plan, treatment alternatives and goals were discussed and mutually agreed upon: by patient and by family  Samantha King 07/11/2021, 10:47 AM

## 2021-07-11 NOTE — Plan of Care (Signed)
  Problem: RH Balance Goal: LTG: Patient will maintain dynamic sitting balance (OT) Description: LTG:  Patient will maintain dynamic sitting balance with assistance during activities of daily living (OT) Flowsheets (Taken 07/11/2021 1242) LTG: Pt will maintain dynamic sitting balance during ADLs with: Independent Goal: LTG Patient will maintain dynamic standing with ADLs (OT) Description: LTG:  Patient will maintain dynamic standing balance with assist during activities of daily living (OT)  Flowsheets (Taken 07/11/2021 1242) LTG: Pt will maintain dynamic standing balance during ADLs with: Contact Guard/Touching assist   Problem: Sit to Stand Goal: LTG:  Patient will perform sit to stand in prep for activites of daily living with assistance level (OT) Description: LTG:  Patient will perform sit to stand in prep for activites of daily living with assistance level (OT) Flowsheets (Taken 07/11/2021 1242) LTG: PT will perform sit to stand in prep for activites of daily living with assistance level: Minimal Assistance - Patient > 75%   Problem: RH Grooming Goal: LTG Patient will perform grooming w/assist,cues/equip (OT) Description: LTG: Patient will perform grooming with assist, with/without cues using equipment (OT) Flowsheets (Taken 07/11/2021 1242) LTG: Pt will perform grooming with assistance level of: Independent with assistive device    Problem: RH Bathing Goal: LTG Patient will bathe all body parts with assist levels (OT) Description: LTG: Patient will bathe all body parts with assist levels (OT) Flowsheets (Taken 07/11/2021 1242) LTG: Pt will perform bathing with assistance level/cueing: Contact Guard/Touching assist   Problem: RH Dressing Goal: LTG Patient will perform upper body dressing (OT) Description: LTG Patient will perform upper body dressing with assist, with/without cues (OT). Flowsheets (Taken 07/11/2021 1242) LTG: Pt will perform upper body dressing with assistance level of: Set  up assist Goal: LTG Patient will perform lower body dressing w/assist (OT) Description: LTG: Patient will perform lower body dressing with assist, with/without cues in positioning using equipment (OT) Flowsheets (Taken 07/11/2021 1242) LTG: Pt will perform lower body dressing with assistance level of: Contact Guard/Touching assist   Problem: RH Toileting Goal: LTG Patient will perform toileting task (3/3 steps) with assistance level (OT) Description: LTG: Patient will perform toileting task (3/3 steps) with assistance level (OT)  Flowsheets (Taken 07/11/2021 1242) LTG: Pt will perform toileting task (3/3 steps) with assistance level: Contact Guard/Touching assist   Problem: RH Toilet Transfers Goal: LTG Patient will perform toilet transfers w/assist (OT) Description: LTG: Patient will perform toilet transfers with assist, with/without cues using equipment (OT) Flowsheets (Taken 07/11/2021 1242) LTG: Pt will perform toilet transfers with assistance level of: Contact Guard/Touching assist   Problem: RH Tub/Shower Transfers Goal: LTG Patient will perform tub/shower transfers w/assist (OT) Description: LTG: Patient will perform tub/shower transfers with assist, with/without cues using equipment (OT) Flowsheets (Taken 07/11/2021 1242) LTG: Pt will perform tub/shower stall transfers with assistance level of: Contact Guard/Touching assist

## 2021-07-11 NOTE — Progress Notes (Addendum)
Occupational Therapy Assessment and Plan  Patient Details  Name: Samantha King MRN: [id number] Date of Birth: [birth year]  OT Diagnosis: acute pain, lumbago (low back pain), and muscle weakness (generalized) Rehab Potential: Rehab Potential (ACUTE ONLY): Excellent ELOS: 3 weeks   Today's Date: 07/11/2021 OT Individual Time: 1100-1200 OT Individual Time Calculation (min): 60 min     Hospital Problem: Principal Problem:   Debility Active Problems:   Severe muscle deconditioning   MSSA bacteremia   Past Medical History:  Past Medical History:  Diagnosis Date   CAD (coronary artery disease)    a. s/p INF-LAT STEMI => s/p DES-RCA c/b VF arrest and cardiogenic shock   Cardiac arrest ([place]) 10/2012   STEMI with VF arrest x 2    GERD (gastroesophageal reflux disease)    H1N1 influenza 10/2012   Hx of echocardiogram    a. Echo 10/30/12: Moderate LVH, EF 55-60%, normal wall motion, PASP 45   Hyperlipidemia    Hypertension    Myocardial infarction ([place])    Pneumonia 10/2012   a. STEMI c/b LLL pneumonia in setting of recent H1N1 influenza   PUD (peptic ulcer disease)    Syncope 1996   reported episode while driving in [place] in 1996 with full cardiac workup = negative   Tobacco abuse    Past Surgical History:  Past Surgical History:  Procedure Laterality Date   BREAST EXCISIONAL BIOPSY     left breast ? 1981   CORONARY ANGIOPLASTY WITH STENT PLACEMENT     s/p inferior STEMI c/b VF arrest and cardiogenic shock requiring IABP   ESOPHAGOGASTRODUODENOSCOPY (EGD) WITH PROPOFOL N/A 03/10/2020   Procedure: ESOPHAGOGASTRODUODENOSCOPY (EGD) WITH PROPOFOL;  Surgeon: Lavena Bullion, DO;  Location: [facility];  Service: Gastroenterology;  Laterality: N/A;   HEMOSTASIS CLIP PLACEMENT  03/10/2020   Procedure: HEMOSTASIS CLIP PLACEMENT;  Surgeon: Lavena Bullion, DO;  Location: [facility];  Service: Gastroenterology;;   HEMOSTASIS CONTROL  03/10/2020   Procedure: HEMOSTASIS CONTROL;   Surgeon: Lavena Bullion, DO;  Location: [facility]OSCOPY;  Service: Gastroenterology;;  epi   INTRA-AORTIC BALLOON PUMP INSERTION  10/29/2012   Procedure: INTRA-AORTIC BALLOON PUMP INSERTION;  Surgeon: Burnell Blanks, MD;  Location: [facility] CATH LAB;  Service: Cardiovascular;;   LEFT HEART CATHETERIZATION WITH CORONARY ANGIOGRAM N/A 10/29/2012   Procedure: LEFT HEART CATHETERIZATION WITH CORONARY ANGIOGRAM;  Surgeon: Burnell Blanks, MD;  Location: [facility] CATH LAB;  Service: Cardiovascular;  Laterality: N/A;   PERCUTANEOUS CORONARY STENT INTERVENTION (PCI-S)  10/29/2012   Procedure: PERCUTANEOUS CORONARY STENT INTERVENTION (PCI-S);  Surgeon: Burnell Blanks, MD;  Location: [facility] CATH LAB;  Service: Cardiovascular;;   TUBAL LIGATION      Assessment & Plan Clinical Impression: Patient is a 75 y.o. year old female with history of CAD s/p VF arrest, PUD, tobacco abuse who was originally admitted to [facility] on 06/18/2021 with reports of fevers and chills X one week with severe back/ Left knee pain and leucocytosis with WBCs 24/9 due to sepsis from MSSA bacteremia.  History taken from chart review and patient. CT spine showed L4-L5 anterolisthesis with severe canal stenosis, moderate left foraminal stenosis L3-L4. CTA chest/abdomen negative for dissection and showed multiple pulmonary nodules up to 2 cm, NO PE, left adrenal nodules, 6.2 mm non obstructive left renal stone, diverticulosis and aortic atherosclerosis with centrilobular/paraseptal emphysema. ID consulted for input and expressed concerns of vertebral infection/diskitis. Patient started on Cefazolin with recommendations of MRI for work up.     Hospital course  significant for decompensation from septic shock on 06/20/2021 with hypoxia, marked hypotension and A fib with RVR. Dr. Cathie Olden consulted for input and she was started on IV heparin, pressors, Cardizem as well as amiodarone for rate control. 2D Echo showed  EF 60-65% with moderate  asymmetric LVH and moderate TVR.  Respiratory failure felt to be due to acute diastolic CHF in setting of A fib and respiratory status improve with high flow oxygen and nebs. PNA/pulmonary effusions felt to be due to pulmonary septic emboli with presumed endocarditis  (due to numerous pulmonary opacities) and diuresis limited by AKI.    She did have rise in WBC to 33.5 and repeat BC X 2 are negative. Follow up MRI spine on 08/12 showed severe multifactorial spinal stenosis L4/5 and moderate stenosis L3/4. MRI abdomen done showing benign left adrenal adenomas, LLL PNA. She continued to have severe back pain out of proportion due to chronic changes but developed extreme sedation with low dose fentanyl. Dr. Baxter Flattery recommended IV Cefazolin  2 gram every 8 hours for 6 weeks with end-date 08/01/21. Pain control was improving with  titration of meds and Ketorolac but she continued to have hypoxia requiring 40-50% HFNC. Therapy was ongoing and patient was limited to bed mobility with inability to lift RUE due to pain, trace movements of BLE and inability to tolerate ROM of any extremities.    She was transferred to [facility] on 08/16 for management and therapy. Respiratory status has improved and she has been weaned down to 4L supplemental oxygen at rest. She started having rise in WBC-19.6 and sed rate-106  and ID consulted for  input. MRI T/L spine done 08/24 revealing interval development of marrow edema bilateral disc space L3/4 suspicious for discitis and osteomyelitis as well as 10-15 ml fluid collection within left psoas suspicious for abscess. IR consulted for aspiration/biopsy but felt that fluid not amenable to aspiration. Antibiotic changed to Zosyn on 08/25 by Dr. Barth Kirks. She developed bilateral foot pain with erythema due to gout flare on 08/28 and was started on prednisone 30  mg X 5 days as well as colcrys with improvement in symptoms. Cognition is back to baseline per husband and she is showing improvement in  activity tolerance and participation but continues to be deconditioned. CIR recommended due to functional decline. Please see preadmission assessment from earlier today as well.    Patient transferred to CIR on 07/10/2021 .    Patient currently requires Max/ total with basic self-care skills secondary to muscle weakness, decreased cardiorespiratoy endurance and decreased oxygen support, and decreased sitting balance, decreased standing balance, decreased postural control, decreased balance strategies, and difficulty maintaining precautions.  Prior to hospitalization, patient could complete BADAL with independent .  Patient will benefit from skilled intervention to increase independence with basic self-care skills prior to discharge home with care partner.  Anticipate patient will require 24 hour supervision and minimal physical assistance and follow up home health.  OT - End of Session Endurance Deficit: Yes Endurance Deficit Description: Requires frequent rest breaks during functional BADL tasks OT Assessment Rehab Potential (ACUTE ONLY): Excellent OT Patient demonstrates impairments in the following area(s): Balance;Endurance;Edema;Pain;Motor;Nutrition;Skin Integrity OT Basic ADL's Functional Problem(s): Grooming;Bathing;Toileting;Dressing OT Transfers Functional Problem(s): Toilet;Tub/Shower OT Additional Impairment(s): None OT Plan OT Intensity: Minimum of 1-2 x/day, 45 to 90 minutes OT Frequency: 5 out of 7 days OT Duration/Estimated Length of Stay: 3 weeks OT Treatment/Interventions: Balance/vestibular training;Cognitive remediation/compensation;Community reintegration;Disease mangement/prevention;Discharge planning;DME/adaptive equipment instruction;Functional electrical stimulation;Functional mobility training;Neuromuscular re-education;Pain management;Patient/family education;Psychosocial support;Self  Care/advanced ADL retraining;Skin care/wound managment;Splinting/orthotics;Therapeutic  Activities;Therapeutic Exercise;UE/LE Strength taining/ROM;UE/LE Coordination activities;Visual/perceptual remediation/compensation;Wheelchair propulsion/positioning OT Basic Self-Care Anticipated Outcome(s): CGA/supervision OT Toileting Anticipated Outcome(s): CGA OT Bathroom Transfers Anticipated Outcome(s): CGA OT Recommendation Recommendations for Other Services: Therapeutic Recreation consult Therapeutic Recreation Interventions: Pet therapy;Kitchen group Patient destination: Home Follow Up Recommendations: Home health OT Equipment Recommended: To be determined   OT Evaluation Precautions/Restrictions  Precautions Precautions: Fall Pain Pain Assessment 6/10 pain in low back. 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  Lives With: Spouse (Husband) IADL History Homemaking Responsibilities: Yes Prior Function Level of Independence: Independent with basic ADLs, Independent with homemaking with ambulation  Able to Take Stairs?: Yes Driving: Yes Vocation: Psychologist, occupational work Biomedical scientist: Volunteers for Building surveyor, daycare center at [employer] Comments: Very involved with church activities Vision Baseline Vision/History: 1 Wears glasses Wears Glasses: At all times Patient Visual Report: No change from baseline Vision Assessment?: No apparent visual deficits Perception  Perception: Within Functional Limits Praxis Praxis: Intact Cognition Overall Cognitive  Status: Within Functional Limits for tasks assessed Arousal/Alertness: Awake/alert Orientation Level: Place;Person;Situation Person: Oriented Place: Oriented Situation: Oriented Year: 2022 Month: September Day of Week: Correct Memory: Appears intact Immediate Memory Recall: Sock;Blue;Bed Memory Recall Sock: Without Cue Memory Recall Blue: Without Cue Memory Recall Bed: Not able to recall Awareness: Appears intact Problem Solving: Appears intact Safety/Judgment: Appears intact Sensation Sensation Light Touch: Impaired by gross assessment Coordination Gross Motor Movements are Fluid and Coordinated: No Fine Motor Movements are Fluid and Coordinated: Yes Coordination and Movement Description: Affetced by low back pain/muscle spasms, fear-avoidance behavior, global deconditioning Finger Nose Finger Test: NT Heel Shin Test: NT Motor  Motor Motor: Abnormal tone;Paraplegia Motor - Skilled Clinical Observations: Hypotonia of BLEs  Trunk/Postural Assessment  Cervical Assessment Cervical Assessment: Exceptions to [facility] (Forward head) Thoracic Assessment Thoracic Assessment: Exceptions to [facility] (Kyphotic) Lumbar Assessment Lumbar Assessment: Exceptions to [facility] (Posterior pelvic tilt) Postural Control Postural Control: Deficits on evaluation Head Control: Limited by pain Trunk Control: Trunk lean to L, requires BUE support Protective Responses: Impaired 2/2 pain and global deconditioning  Balance Balance Balance Assessed: Yes Static Sitting Balance Static Sitting - Balance Support: Feet supported;Bilateral upper extremity supported Static Sitting - Level of Assistance: 4: Min assist (Trunk support) Dynamic Sitting Balance Dynamic Sitting - Balance Support: Feet supported;Right upper extremity supported Dynamic Sitting - Level of Assistance: 4: Min assist (Trunk support) Static Standing Balance Static Standing - Balance Support: Bilateral upper extremity supported Static Standing -  Level of Assistance: Not tested (comment) (Pt unable to tolerate standing) Extremity/Trunk Assessment RUE Assessment RUE Assessment: Exceptions to [facility] General Strength Comments: generalized weakness LUE Assessment LUE Assessment: Exceptions to [facility] General Strength Comments: generalized weakness  Vision- History Baseline Vision/History 1 Wears glasses Wears Glasses Reading only Ability to See in Adequate Light 0 Adequate Patient Visual Report No change from baseline  Care Tool Care Tool Self Care Eating   Eating Assist Level: Set up assist    Oral Care    Oral Care Assist Level: Minimal Assistance - Patient > 75%    Bathing   Body parts bathed by  patient: Right arm;Left arm;Chest;Abdomen;Front perineal area;Face Body parts bathed by helper: Left upper leg;Right upper leg;Buttocks;Right lower leg;Left lower leg   Assist Level: Maximal Assistance - Patient 24 - 49%    Upper Body Dressing(including orthotics)   What is the patient wearing?: Pull over shirt   Assist Level: Maximal Assistance - Patient 25 - 49%    Lower Body Dressing (excluding footwear)   What is the patient wearing?: Pants Assist for lower body dressing: Total Assistance - Patient < 25%    Putting on/Taking off footwear     Assist for footwear: Total Assistance - Patient < 25%       Care Tool Toileting Toileting activity   Assist for toileting: Dependent - Patient 0%     Care Tool Bed Mobility Roll left and right activity   Roll left and right assist level: Minimal Assistance - Patient > 75%    Sit to lying activity   Sit to lying assist level: Moderate Assistance - Patient 50 - 74%    Lying to sitting on side of bed activity   Lying to sitting on side of bed assist level: the ability to move from lying on the back to sitting on the side of the bed with no back support.: Minimal Assistance - Patient > 75%     Care Tool Transfers Sit to stand transfer        Chair/bed transfer Chair/bed  transfer activity did not occur: Safety/medical concerns       Toilet transfer Toilet transfer activity did not occur: Safety/medical concerns       Care Tool Cognition  Expression of Ideas and Wants Expression of Ideas and Wants: 4. Without difficulty (complex and basic) - expresses complex messages without difficulty and with speech that is clear and easy to understand  Understanding Verbal and Non-Verbal Content Understanding Verbal and Non-Verbal Content: 4. Understands (complex and basic) - clear comprehension without cues or repetitions   Memory/Recall Ability Memory/Recall Ability : Current season;That he or she is in a hospital/hospital unit;Location of own room   Refer to Care Plan for Long Term Goals  SHORT TERM GOAL WEEK 1 OT Short Term Goal 1 (Week 1): Pt will tolerate sitting EOB for 10 minutes within functional BADL task OT Short Term Goal 2 (Week 1): Patient will complete BSC transfer with max A +2 OT Short Term Goal 3 (Week 1): Pt will complete 1 step of LB dressing task  Recommendations for other services: Therapeutic Recreation  Pet therapy and Kitchen group   Skilled Therapeutic Intervention Pt greeted semi-reclined in bed and agreeable to OT eval and treatment session. Nursing called IV team to take pt off of IV. Pt reported need to urinate. Rolling with min/mod A to place bedpan for continent void of bladder. Worked on LB bathing/dressing using rolling method. Pt completed bed log roll to come to sitting EOB with min A. Pt tolerated sitting EOB for ~7 minutes while performed UB ADLs. Pt with difficulty removing unilateral UE from EOB to assist with BADLs as she felt she needed them for balance. See functional navigator below for further details on BADL performance. Pt reported pain too high to try to stand, but worked on scooting along EOB with max A. OT eval completed addressing rehab process, OT purpose, POC, ELOS, and goals.   Pt returned to supine and left  semi-reclined in bed with needs met.  ADL ADL Eating: Set up Grooming: Minimal assistance Upper Body Bathing: Moderate assistance Lower Body  Bathing: Dependent Upper Body Dressing: Maximal assistance Lower Body Dressing: Dependent Toileting: Dependent Toilet Transfer: Unable to assess Mobility  Bed Mobility Bed Mobility: Rolling Right;Rolling Left Rolling Right: Supervision/verbal cueing (Bedrail) Rolling Left: Supervision/Verbal cueing (Bedrail) Supine to Sit: Moderate Assistance - Patient 50-74% Sitting - Scoot to Edge of Bed: Moderate Assistance - Patient 50-74% Sit to Supine: Minimal Assistance - Patient > 75% Transfers Sit to Stand: Total Assistance - Patient < 25% Stand to Sit: Total Assistance - Patient < 25%   Discharge Criteria: Patient will be discharged from OT if patient refuses treatment 3 consecutive times without medical reason, if treatment goals not met, if there is a change in medical status, if patient makes no progress towards goals or if patient is discharged from hospital.  The above assessment, treatment plan, treatment alternatives and goals were discussed and mutually agreed upon: by patient  Valma Cava 07/11/2021, 1:01 PM

## 2021-07-11 NOTE — Progress Notes (Signed)
[facility] Individual Statement of Services  Patient Name:  LATANJA MASLEN  Date:  07/11/2021  Welcome to the [facility].  Our goal is to provide you with an individualized program based on your diagnosis and situation, designed to meet your specific needs.  With this comprehensive rehabilitation program, you will be expected to participate in at least 3 hours of rehabilitation therapies Monday-Friday, with modified therapy programming on the weekends.  Your rehabilitation program will include the following services:  Physical Therapy (PT), Occupational Therapy (OT), Speech Therapy (ST), 24 hour per day rehabilitation nursing, Therapeutic Recreaction (TR), Care Coordinator, Rehabilitation Medicine, Nutrition Services, and Pharmacy Services  Weekly team conferences will be held on Tuesday to discuss your progress.  Your Inpatient Rehabilitation Care Coordinator will talk with you frequently to get your input and to update you on team discussions.  Team conferences with you and your family in attendance may also be held.  Expected length of stay: 18-21 Days  Overall anticipated outcome: min-mod level of assist  Depending on your progress and recovery, your program may change. Your Inpatient Rehabilitation Care Coordinator will coordinate services and will keep you informed of any changes. Your Inpatient Rehabilitation Care Coordinator's name and contact numbers are listed  below.  The following services may also be recommended but are not provided by the [facility] will be made to provide these services after discharge if needed.  Arrangements include referral to agencies that provide these services.  Your insurance has been verified to be:  [place] Your primary doctor is:  Domenick Gong  Pertinent information  will be shared with your doctor and your insurance company.  Inpatient Rehabilitation Care Coordinator:  Ovidio Kin, [place] or Emilia Beck  Information discussed with and copy given to patient by: Elease Hashimoto, 07/11/2021, 12:30 PM
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
## 2021-07-11 NOTE — Progress Notes (Signed)
Pharmacist f/u to Inpatient Rehabilitation Medication Review by a Pharmacist  Con't Zosyn 3.375g IV q8hr per [facility] protocol. Each dose given over 4 hrs. Taper prednisone toward off for gout.  Colchicine down to once daily ok unless gout not being controlled. Add Voltaren gel and Miralax if/when needed.   Samantha King S. Alford Highland, PharmD, BCPS Clinical Staff Pharmacist [email or web address]
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
## 2021-07-11 NOTE — Progress Notes (Signed)
Initial Nutrition Assessment  DOCUMENTATION CODES:   Not applicable  INTERVENTION:   -Continue Ensure Enlive (Vanilla) - TID  -Provides 350 kcal & 20 gm PRO  -Educated on menu and meal preferences.  -Encourage good PO intake  NUTRITION DIAGNOSIS:   Increased nutrient needs related to acute illness (Sepsis & Rehab) as evidenced by estimated needs.   GOAL:   Patient will meet greater than or equal to 90% of their needs.   MONITOR:   PO intake, Supplement acceptance  REASON FOR ASSESSMENT:   Consult Other (Comment)  ASSESSMENT:   Pt admitted to CIR from [facility] for continued rehab due to sepsis complications. Pt with PMH of COPD, HF, HTN, and GERD.  Pt reports that appetite was great at home and no weight loss prior to admission with sepsis. She was eating 3 meals per day, no ONS, and ambulating on her own and lives with her husband. A typical day looked like, Breakfast: egg (scrambled or hard-boiled) with bacon or sausage Lunch: salad and/or on occasion a sandwich Dinner: protein w/ 2 or 3 green vegetables and a salad  Pt reports that her intake while admitted has not been that great. She does not like the food that is provided, educated on ordering food and encouraged to look for foods that she likes and will eat. Does report drinking 3 Ensures per day while admitted and will continue.   08/09: Admitted to [facility] for back pain and sepsis due to MSSA bacteremia. 08/16: Transferred to [facility] for management and treatment on 4L HFNC and IV antibiotics.  08/31: Pt admitted to CIR.  Medications: Vitamin C, Lasiz, Lipitor Supplements: Ensure Enlive - TID  NUTRITION - FOCUSED PHYSICAL EXAM:  Flowsheet Row Most Recent Value  Orbital Region Mild depletion  Upper Arm Region No depletion  Thoracic and Lumbar Region No depletion  Buccal Region No depletion  Temple Region No depletion  Clavicle Bone Region No depletion  Clavicle and Acromion Bone Region No depletion  Scapular Bone  Region No depletion  Dorsal Hand Mild depletion  Patellar Region Mild depletion  Anterior Thigh Region Mild depletion  Posterior Calf Region No depletion  Edema (RD Assessment) None  Hair Reviewed  Eyes Reviewed  Mouth Reviewed  Skin Reviewed  Nails Reviewed       Diet Order:   Diet Order             Diet regular Room service appropriate? Yes; Fluid consistency: Thin  Diet effective now                   EDUCATION NEEDS:   No education needs have been identified at this time  Skin:  Skin Assessment: Reviewed RN Assessment  Last BM:  09/01  Height:   Ht Readings from Last 1 Encounters:  07/10/21 '5\' 3"'$  (1.6 m)    Weight:   Wt Readings from Last 1 Encounters:  07/10/21 77.3 kg    Ideal Body Weight:  52 kg  BMI:  Body mass index is 30.2 kg/m.  Estimated Nutritional Needs:   Kcal:  1700-1900  Protein:  85-100 grams  Fluid:  1700-1900 mL    Hermina Barters, PLRD  07/11/21

## 2021-07-11 NOTE — Plan of Care (Signed)
  Problem: RH Balance Goal: LTG Patient will maintain dynamic sitting balance (PT) Description: LTG:  Patient will maintain dynamic sitting balance with assistance during mobility activities (PT) Flowsheets (Taken 07/11/2021 1225) LTG: Pt will maintain dynamic sitting balance during mobility activities with:: Supervision/Verbal cueing Goal: LTG Patient will maintain dynamic standing balance (PT) Description: LTG:  Patient will maintain dynamic standing balance with assistance during mobility activities (PT) Flowsheets (Taken 07/11/2021 1225) LTG: Pt will maintain dynamic standing balance during mobility activities with:: (LRAD) Minimal Assistance - Patient > 75% Note: LRAD   Problem: Sit to Stand Goal: LTG:  Patient will perform sit to stand with assistance level (PT) Description: LTG:  Patient will perform sit to stand with assistance level (PT) Flowsheets (Taken 07/11/2021 1225) LTG: PT will perform sit to stand in preparation for functional mobility with assistance level: (LRAD) Minimal Assistance - Patient > 75% Note: LRAD   Problem: RH Bed Mobility Goal: LTG Patient will perform bed mobility with assist (PT) Description: LTG: Patient will perform bed mobility with assistance, with/without cues (PT). Flowsheets (Taken 07/11/2021 1225) LTG: Pt will perform bed mobility with assistance level of: Independent with assistive device    Problem: RH Bed to Chair Transfers Goal: LTG Patient will perform bed/chair transfers w/assist (PT) Description: LTG: Patient will perform bed to chair transfers with assistance (PT). Flowsheets (Taken 07/11/2021 1225) LTG: Pt will perform Bed to Chair Transfers with assistance level: (LRAD) Minimal Assistance - Patient > 75%   Problem: RH Car Transfers Goal: LTG Patient will perform car transfers with assist (PT) Description: LTG: Patient will perform car transfers with assistance (PT). Flowsheets (Taken 07/11/2021 1225) LTG: Pt will perform car transfers with  assist:: (LRAD) Minimal Assistance - Patient > 75%   Problem: RH Ambulation Goal: LTG Patient will ambulate in controlled environment (PT) Description: LTG: Patient will ambulate in a controlled environment, # of feet with assistance (PT). Flowsheets (Taken 07/11/2021 1225) LTG: Pt will ambulate in controlled environ  assist needed:: (LRAD) Moderate Assistance - Patient 50 - 74% LTG: Ambulation distance in controlled environment: 58' Goal: LTG Patient will ambulate in home environment (PT) Description: LTG: Patient will ambulate in home environment, # of feet with assistance (PT). Flowsheets (Taken 07/11/2021 1225) LTG: Pt will ambulate in home environ  assist needed:: (LRAD) Moderate Assistance - Patient 50 - 74% LTG: Ambulation distance in home environment: 50'   Problem: RH Wheelchair Mobility Goal: LTG Patient will propel w/c in controlled environment (PT) Description: LTG: Patient will propel wheelchair in controlled environment, # of feet with assist (PT) Flowsheets (Taken 07/11/2021 1225) LTG: Pt will propel w/c in controlled environ  assist needed:: Set up assist LTG: Propel w/c distance in controlled environment: 150' Goal: LTG Patient will propel w/c in home environment (PT) Description: LTG: Patient will propel wheelchair in home environment, # of feet with assistance (PT). Flowsheets (Taken 07/11/2021 1225) LTG: Pt will propel w/c in home environ  assist needed:: Set up assist LTG: Propel w/c distance in home environment: 11'   Problem: RH Stairs Goal: LTG Patient will ambulate up and down stairs w/assist (PT) Description: LTG: Patient will ambulate up and down # of stairs with assistance (PT) Flowsheets (Taken 07/11/2021 1225) LTG: Pt will ambulate up/down stairs assist needed:: (BUE support) Moderate Assistance - Patient 50 - 74% LTG: Pt will  ambulate up and down number of stairs: 4

## 2021-07-11 NOTE — Progress Notes (Signed)
Patient ID: Samantha King, female   DOB: [birth year], 75 y.o.   MRN: [id number] Met with patient and her spouse  to introduce self and role of the nurse CM. Initiated education on the rehab process, medications, and plan of care including review of handouts for HF, HTN, dietary modifications, etc. Continue to follow along to discharge to address questions and collaborate with the team to facilitate preparation for discharge. Patient with MASD and WOC consulted for care recommendations. Given information on increased protein intake and low-purine eating plan. [name], RN

## 2021-07-11 NOTE — Progress Notes (Signed)
PROGRESS NOTE   Subjective/Complaints: Pt says she had a good night. Happy to be here! Experiences spasms and twitching at times in left hip. Occasional cough  ROS: Patient denies fever, rash, sore throat, blurred vision, nausea, vomiting, diarrhea,   shortness of breath or chest pain,  headache, or mood change.    Objective:   DG Abd 1 View  Result Date: 07/10/2021 CLINICAL DATA:  Diarrhea. EXAM: ABDOMEN - 1 VIEW COMPARISON:  06/28/2021 FINDINGS: Gas is seen within both nondilated small bowel loops and colon. IMPRESSION: Nonspecific, nonobstructive bowel gas pattern. Electronically Signed   By: Marlaine Hind M.D.   On: 07/10/2021 19:31   Recent Labs    07/09/21 0346 07/11/21 0431  WBC 12.6* 11.7*  HGB 10.0* 10.5*  HCT 31.9* 33.2*  PLT 538* 593*   Recent Labs    07/09/21 0346 07/10/21 0415 07/11/21 0431  NA 137  --  138  K 3.3* 3.8 3.0*  CL 98  --  100  CO2 29  --  30  GLUCOSE 105*  --  93  BUN 9  --  12  CREATININE 0.72  --  0.71  CALCIUM 9.3  --  9.1    Intake/Output Summary (Last 24 hours) at 07/11/2021 1024 Last data filed at 07/11/2021 0300 Gross per 24 hour  Intake 50 ml  Output --  Net 50 ml        Physical Exam: Vital Signs Blood pressure 136/86, pulse 80, temperature 97.8 F (36.6 C), temperature source Oral, resp. rate 14, height '5\' 3"'$  (1.6 m), weight 77.3 kg, SpO2 95 %.  General: Alert and oriented x 3, No apparent distress HEENT: Head is normocephalic, atraumatic, PERRLA, EOMI, sclera anicteric, oral mucosa pink and moist, dentition intact, ext ear canals clear,  Neck: Supple without JVD or lymphadenopathy Heart: Reg rate and rhythm. No murmurs rubs or gallops Chest: CTA bilaterally without wheezes, rales, or rhonchi; no distress but shallow breaths. O2 [place] Abdomen: Soft, non-tender, non-distended, bowel sounds positive. Extremities: No clubbing, cyanosis, or edema. Pulses are 2+ Psych: Pt's  affect is appropriate. Pt is cooperative Skin: a few healing skin tears on buttocks/sacral area, back Neuro:  Alert and oriented x 3. Normal insight and awareness. Intact Memory. Normal language and speech. Cranial nerve exam unremarkable. Motor 4/5 UE and 3-4/5 LE's. Distal sensory loss in toes? Musculoskeletal: Full ROM, No pain with AROM or PROM in the neck, trunk, or extremities. Posture appropriate. No taut bands    Assessment/Plan: 1. Functional deficits which require 3+ hours per day of interdisciplinary therapy in a comprehensive inpatient rehab setting. Physiatrist is providing close team supervision and 24 hour management of active medical problems listed below. Physiatrist and rehab team continue to assess barriers to discharge/monitor patient progress toward functional and medical goals  Care Tool:  Bathing              Bathing assist       Upper Body Dressing/Undressing Upper body dressing        Upper body assist Assist Level: Maximal Assistance - Patient 25 - 49%    Lower Body Dressing/Undressing Lower body dressing  Lower body assist       Toileting Toileting    Toileting assist Assist for toileting: Maximal Assistance - Patient 25 - 49%     Transfers Chair/bed transfer  Transfers assist  Chair/bed transfer activity did not occur: Safety/medical concerns (Pain, fatigue)        Locomotion Ambulation   Ambulation assist   Ambulation activity did not occur: Safety/medical concerns (Pain, fatigue)          Walk 10 feet activity   Assist  Walk 10 feet activity did not occur: Safety/medical concerns (Pain, fatigue)        Walk 50 feet activity   Assist Walk 50 feet with 2 turns activity did not occur: Safety/medical concerns (Pain, fatigue)         Walk 150 feet activity   Assist Walk 150 feet activity did not occur: Safety/medical concerns (Pain, fatigue)         Walk 10 feet on uneven surface   activity   Assist Walk 10 feet on uneven surfaces activity did not occur: Safety/medical concerns (Pain, fatigue)         Wheelchair     Assist Is the patient using a wheelchair?: Yes Type of Wheelchair: Manual Wheelchair activity did not occur: Safety/medical concerns (Pain, fatigue)         Wheelchair 50 feet with 2 turns activity    Assist    Wheelchair 50 feet with 2 turns activity did not occur: Safety/medical concerns (Pain, fatigue)       Wheelchair 150 feet activity     Assist  Wheelchair 150 feet activity did not occur: Safety/medical concerns (pain, fatigue)       Blood pressure 136/86, pulse 80, temperature 97.8 F (36.6 C), temperature source Oral, resp. rate 14, height '5\' 3"'$  (1.6 m), weight 77.3 kg, SpO2 95 %.  Medical Problem List and Plan: 1.  Deficits with mobility, endurance, self-care secondary to debility             -patient may shower             -ELOS/Goals: 15-18 days/Supervision/Min A             Patient is beginning CIR therapies today including PT and OT  2.  Antithrombotics: -DVT/anticoagulation:  Pharmaceutical: Lovenox             -antiplatelet therapy: Plavix 3. Pain Management: Oxycodone prn             --continue Cymbalta and Gabapentin.              Monitor with increased exertion  -add robaxin for spasms 4. Mood: LCSW to follow for evaluation and support.              -antipsychotic agents: N/a 5. Neuropsych: This patient is capable of making decisions on her own behalf. 6. Skin/Wound Care: Routine pressure relief measure. Foam dressing to sacrum 7. Fluids/Electrolytes/Nutrition: Intake poor --does not like the food here.             --discussed food choices--will liberalize to regular for flavor.  --Advised husband to bring supplements/food from home 8. MSSA bacteremia w/sepsis: On Zosyn--> will consult ID for input             --WBC continues to fluctuate, sl down 9/1. Sed rate/CRP trending down 9. Diskitis:  Recommendations are to repeat films next week for follow up             - consult  ID for eval/input 10. COPD/Pulmonary septic emboli: On Zosyn-encourage pulmonary toilet with flutter valve--discussed with her today             --continue Incurse inhaler daily             Wean supplemental oxygen as tolerated 11. Gout flare: On prednisone D#5--taper to 20  mg/day today 09/01.  12.. Hypokalemia:  . Added supplement as Lasix on board. 3.0 9/1  give addnl supp today and continue scheduled 13. Neurogenic bladder v/s overflow: Foley d/c'ed 08/28 and she has not been able to control her bladder             UA negative, Ucx pending             --needs a few pvr's, is continent. Toilet patient every 3 hours 14. Diarrhea w/incontinence/Neurogenic bowel?: Goes after she eats as well as prn             --discontinued Miralax --KUB only with gas -no stools recorded yet -add daily fiber    LOS: 1 days A FACE TO FACE EVALUATION WAS PERFORMED  Meredith Staggers 07/11/2021, 10:24 AM

## 2021-07-12 DIAGNOSIS — Z9981 Dependence on supplemental oxygen: Secondary | ICD-10-CM

## 2021-07-12 DIAGNOSIS — R5381 Other malaise: Secondary | ICD-10-CM | POA: Diagnosis not present

## 2021-07-12 DIAGNOSIS — R195 Other fecal abnormalities: Secondary | ICD-10-CM

## 2021-07-12 DIAGNOSIS — E876 Hypokalemia: Secondary | ICD-10-CM | POA: Diagnosis not present

## 2021-07-12 DIAGNOSIS — R7881 Bacteremia: Secondary | ICD-10-CM | POA: Diagnosis not present

## 2021-07-12 LAB — GLUCOSE, CAPILLARY
Glucose-Capillary: 87 mg/dL (ref 70–99)
Glucose-Capillary: 163 mg/dL — ABNORMAL HIGH (ref 70–99)
Glucose-Capillary: 103 mg/dL — ABNORMAL HIGH (ref 70–99)
Glucose-Capillary: 159 mg/dL — ABNORMAL HIGH (ref 70–99)

## 2021-07-12 NOTE — Progress Notes (Signed)
Occupational Therapy Session Note  Patient Details  Name: Samantha King MRN: [id number] Date of Birth: June 08, 1946  Today's Date: 07/12/2021 OT Individual Time: 0488-8916 OT Individual Time Calculation (min): 53 min    Short Term Goals: Week 1:  OT Short Term Goal 1 (Week 1): Pt will tolerate sitting EOB for 10 minutes within functional BADL task OT Short Term Goal 2 (Week 1): Patient will complete BSC transfer with max A +2 OT Short Term Goal 3 (Week 1): Pt will complete 1 step of LB dressing task  Skilled Therapeutic Interventions/Progress Updates:     Pt received semi-reclined in bed, c/o ongoing L lower back pain as "twitch", but not requesting intervention at this time, agreeable to therapy. Session focus on self-care retraining, activity tolerance, BUE/BLE strengthening, func transfers, nonpharm pain management, in prep for improved ADL/IADL/func mobility performance + decreased caregiver burden. Reviewed avoiding bending, lifting, twisting of back to avoid increasing back pain. Completed log-roll to EOB with overall mod A to progress BLE off bed. Maintained static sitting with S. STS in stedy with max A to power up 2/2 trunk flexion. Stedy transfer > w/c.  SatO2 at 94% on 2L via [place]. Oriented to unit via w/c transport. Completed 1x10 of the following with level 3 resistive theraband: B biceps curls, forward punches, shoulder rows, horizontal shoulder abduction, hip abduction, hamstring curls, and knee extension. Provided therband for room use and reviewed therex pt can complete at bed level. Pt with questions on sleeping position for lower back pain, suggested pt trial keeping torso from bending and having pillow between/under knees. Therapeutic use of self utilized throughout session for emotional support as pt reports nervousness re mobility, going home.  Pt left seated in w/c with safety belt alarm engaged, call bell in reach, and all immediate needs met. Hooked back up to wall  O2.  Therapy Documentation Precautions:  Precautions Precautions: Fall Restrictions Weight Bearing Restrictions: No  Pain:  See session note ADL: See Care Tool for more details.   Therapy/Group: Individual Therapy  Volanda Napoleon MS, OTR/L  07/12/2021, 6:50 AM

## 2021-07-12 NOTE — Progress Notes (Signed)
Physical Therapy Session Note  Patient Details  Name: Samantha King MRN: [id number] Date of Birth: Jun 01, 1946  Today's Date: 07/12/2021 PT Individual Time: 0755-0845 PT Individual Time Calculation (min): 50 min   Short Term Goals: Week 1:  PT Short Term Goal 1 (Week 1): Pt will perform supine <>sit w/min A PT Short Term Goal 2 (Week 1): Pt will perform sit <>stand w/LRAD and mod A PT Short Term Goal 3 (Week 1): Pt will perform bed <>chair transfers mod A w/LRAD PT Short Term Goal 4 (Week 1): Pt will maintain dynamic sitting balance w/no UE support for 2 minutes w/min A  Skilled Therapeutic Interventions/Progress Updates:  PAIN Pt reports "twitch" L lower back, confirms this is a pain but cannot enumerate.  Increased mobility, oob to wc, adjustments to wc seating, pt instructed to contact nursing at end of session for details on avail pain meds.    Pt initially supine, very anxious, states she barely slept worrying she "wouldn't be able to do what we (therapy team) asked"  or that we would be angry w/her if she could not.   Assured pt that we would progress at her pace, ensure safety w/all activities, stop anything she was not willing or able to do, and that we would work as team to help her meet her goals.  Pt gradually appeared to relax, stated "I trust you" and was able to progress thru session w/careful instruction, explanation and demonstration of activities prior to performing, reassurance, calm tone to overall session.    Pt donned pants in supine, lifts feet for therapsint to thread then rolls L/R w/mod assist at hips to fully roll, max assist to raise pants.  Supine to side to sit w/mod assist.  Scoots in sitting w/min assist.  Additional time for pt to feel comfortable w/progress of mobility.  Pt educated on use of Stedy, demonstrated use for Sit to stand and transfers, assured pt of safety.   Sit to partial stand in stedy, cues to achieve partial upright, difficulty w/trunk  extension to full upright due to weakness + pt anticipating pain.  Pt sat in [place] several min.  Asked therapist to photograph her w/her phone so she could text husband.   Transferred to wc w/min assist via Stedy.   Once in wc legrests adjusted to allow improved support and comfort.  Noted sagging of seat and would benefit from applewood insert and properly sized cushion if available.  Pt educated on competence of nursing staff in use of Stedy and discussed w/nurse and nt as best option for transfers at this time due to pt sense of security/decreased anxiety, ease of performance/work.   At end of session, Pt left oob in wc w/NT locating chair alarm box and agreeing to set pt up w/alarm belt.  Pt adjacent to bed in wc w/all needs in reach  Therapy Documentation Precautions:  Precautions Precautions: Fall Restrictions Weight Bearing Restrictions: No     Therapy/Group: Individual Therapy [name], [place] 07/12/2021, 12:47 PM

## 2021-07-12 NOTE — Progress Notes (Signed)
Physical Therapy Session Note  Patient Details  Name: Samantha King MRN: [id number] Date of Birth: [birth year]  Today's Date: 07/12/2021 PT Individual Time: [id number] PT Individual Time Calculation (min): 69 min   Short Term Goals: Week 1:  PT Short Term Goal 1 (Week 1): Pt will perform supine <>sit w/min A PT Short Term Goal 2 (Week 1): Pt will perform sit <>stand w/LRAD and mod A PT Short Term Goal 3 (Week 1): Pt will perform bed <>chair transfers mod A w/LRAD PT Short Term Goal 4 (Week 1): Pt will maintain dynamic sitting balance w/no UE support for 2 minutes w/min A  Skilled Therapeutic Interventions/Progress Updates:  Pt received sitting in WC in room, pleasant and motivated to participate in therapy. Pt reported pain in low back as 5/10, described it as a muscle spasm and requested pain meds, nursing notified and therapist provided holistic techniques and positional changes for pain relief throughout session.   Pre-gait training w/Stedy  -Sit <>stand x 6 w/ 30-60s hold in [place] w/min A for improved LE weightbearing tolerance, pressure relief, and boosted confidence. Pt very anxious about standing in stedy prior to session, provided empathy and reassuring cues, pt agreeable to try. While standing, talked pt through diaphragmatic breathing and engaged her in conversation to provide distraction and reduce anxiety. Pt able to obtain full trunk erection w/each stand w/minimal use of hands. Pt reported soiled brief, transported pt to bed via Stedy.   Pt performed sit EOB <>supine w/min A for LE management. Pt rolled to L & R several times w/supervision and use of bedrail to doff pants, change brief, and don pants w/max A. Nursing present to administer pain meds. Pt's husband and son present at end of session, scoot to [facility] w/mod A x2. Pt was left supine in bed, all needs in reach, reported pain as 4/10 in low back and declined modalities for pain relief.   Therapy Documentation Precautions:   Precautions Precautions: Fall Restrictions Weight Bearing Restrictions: No   Therapy/Group: Individual Therapy Cruzita Lederer Joell Buerger, PT, DPT  07/12/2021, 7:52 AM

## 2021-07-12 NOTE — Progress Notes (Signed)
Occupational Therapy Session Note  Patient Details  Name: Samantha King MRN: [id number] Date of Birth: 05-28-1946  Today's Date: 07/12/2021 OT Individual Time: 2595-6387 OT Individual Time Calculation (min): 45 min    Short Term Goals: Week 1:  OT Short Term Goal 1 (Week 1): Pt will tolerate sitting EOB for 10 minutes within functional BADL task OT Short Term Goal 2 (Week 1): Patient will complete BSC transfer with max A +2 OT Short Term Goal 3 (Week 1): Pt will complete 1 step of LB dressing task  Skilled Therapeutic Interventions/Progress Updates:    Pt received sitting in wc stating she called for A to use a bed pan.  Suggested she try the regular toilet.  Placed BSC over toilet to increase ease with standing. Had pt use stedy with mod to max A to stand up from wc to stedy with cues for pushing through legs. Once in stedy, she held her balance well. Transported to bathroom with A from RN to move Iv pole. Pt assisted with lowering pants and briefs, she then lowered down to toilet with min A and voided urine.  Pt attempted to self cleanse in sitting but too difficult to reach, had her come up to stedy and then with her perched on the pads she was able to self cleanse.  From pads of stedy, pt able to rise up with no A.  Helped pt with brief then she rested in sitting on pads then pants.   Pt transported back to EOB and needed mod A to lower down to bed to avoid increasing pain in back.   Sat EOB with S.  With extra time pt was able to get back into sidelying and supine with no A!  NT assisted me with scooting pt up in the bed.  Adjusted pt in bed with all needs met.  Discussed with pt alternative pain control options of kpad, muscle rub cream, etc.  Discussed with her RN.  Bed alarm on.  Pt participated very well today.  Therapy Documentation Precautions:  Precautions Precautions: Fall Restrictions Weight Bearing Restrictions: No  Pain:  pt stated she was in significant back pain,  RN provided her with medication   ADL: ADL Eating: Set up Grooming: Minimal assistance Upper Body Bathing: Moderate assistance Lower Body Bathing: Dependent Upper Body Dressing: Maximal assistance Lower Body Dressing: Dependent Toileting: Dependent Toilet Transfer: Unable to assess   Therapy/Group: Individual Therapy  [place] 07/12/2021, 11:29 AM

## 2021-07-12 NOTE — Progress Notes (Signed)
PROGRESS NOTE   Subjective/Complaints: Patient seen sitting up in her chair this morning.  She states she slept well overnight.  She states she is not doing as well with therapies as she thought she would, provided encouragement.  She was seen by ID yesterday, notes reviewed-antibiotics changed to cefazolin through 9/22.  ROS: Denies CP, SOB, N/V/D  Objective:   DG Abd 1 View  Result Date: 07/10/2021 CLINICAL DATA:  Diarrhea. EXAM: ABDOMEN - 1 VIEW COMPARISON:  06/28/2021 FINDINGS: Gas is seen within both nondilated small bowel loops and colon. IMPRESSION: Nonspecific, nonobstructive bowel gas pattern. Electronically Signed   By: Marlaine Hind M.D.   On: 07/10/2021 19:31   Recent Labs    07/11/21 0431  WBC 11.7*  HGB 10.5*  HCT 33.2*  PLT 593*    Recent Labs    07/10/21 0415 07/11/21 0431  NA  --  138  K 3.8 3.0*  CL  --  100  CO2  --  30  GLUCOSE  --  93  BUN  --  12  CREATININE  --  0.71  CALCIUM  --  9.1     Intake/Output Summary (Last 24 hours) at 07/12/2021 1106 Last data filed at 07/12/2021 0838 Gross per 24 hour  Intake 440 ml  Output --  Net 440 ml         Physical Exam: Vital Signs Blood pressure (!) 140/92, pulse 83, temperature 98.2 F (36.8 C), temperature source Oral, resp. rate 18, height '5\' 3"'$  (1.6 m), weight 77.3 kg, SpO2 94 %. Constitutional: No distress . Vital signs reviewed. HENT: Normocephalic.  Atraumatic. Eyes: EOMI. No discharge. Cardiovascular: No JVD.  RRR. Respiratory: Normal effort.  No stridor.  Bilateral clear to auscultation. GI: Non-distended.  BS +. Skin: Warm and dry.  Sacrum not examined today Psych: Normal mood.  Normal behavior. Musc: No edema in extremities.  No tenderness in extremities. Neuro: Alert and oriented Motor 4/5 UE and 3-4/5 LE's, unchanged  Assessment/Plan: 1. Functional deficits which require 3+ hours per day of interdisciplinary therapy in a  comprehensive inpatient rehab setting. Physiatrist is providing close team supervision and 24 hour management of active medical problems listed below. Physiatrist and rehab team continue to assess barriers to discharge/monitor patient progress toward functional and medical goals  Care Tool:  Bathing    Body parts bathed by patient: Right arm, Left arm, Chest, Abdomen, Front perineal area, Face   Body parts bathed by helper: Left upper leg, Right upper leg, Buttocks, Right lower leg, Left lower leg     Bathing assist Assist Level: Maximal Assistance - Patient 24 - 49%     Upper Body Dressing/Undressing Upper body dressing   What is the patient wearing?: Pull over shirt    Upper body assist Assist Level: Maximal Assistance - Patient 25 - 49%    Lower Body Dressing/Undressing Lower body dressing      What is the patient wearing?: Pants     Lower body assist Assist for lower body dressing: Total Assistance - Patient < 25%     Toileting Toileting    Toileting assist Assist for toileting: Dependent - Patient 0%  Transfers Chair/bed transfer  Transfers assist  Chair/bed transfer activity did not occur: Safety/medical concerns        Locomotion Ambulation   Ambulation assist   Ambulation activity did not occur: Safety/medical concerns          Walk 10 feet activity   Assist  Walk 10 feet activity did not occur: Safety/medical concerns        Walk 50 feet activity   Assist Walk 50 feet with 2 turns activity did not occur: Safety/medical concerns         Walk 150 feet activity   Assist Walk 150 feet activity did not occur: Safety/medical concerns         Walk 10 feet on uneven surface  activity   Assist Walk 10 feet on uneven surfaces activity did not occur: Safety/medical concerns         Wheelchair     Assist Is the patient using a wheelchair?: Yes Type of Wheelchair: Manual Wheelchair activity did not occur:  Safety/medical concerns (Pain, fatigue)         Wheelchair 50 feet with 2 turns activity    Assist    Wheelchair 50 feet with 2 turns activity did not occur: Safety/medical concerns (Pain, fatigue)       Wheelchair 150 feet activity     Assist  Wheelchair 150 feet activity did not occur: Safety/medical concerns (pain, fatigue)       Blood pressure (!) 140/92, pulse 83, temperature 98.2 F (36.8 C), temperature source Oral, resp. rate 18, height '5\' 3"'$  (1.6 m), weight 77.3 kg, SpO2 94 %.  Medical Problem List and Plan: 1.  Deficits with mobility, endurance, self-care secondary to debility             Continue CIR 2.  Antithrombotics: -DVT/anticoagulation:  Pharmaceutical: Lovenox             -antiplatelet therapy: Plavix 3. Pain Management: Oxycodone prn             --continue Cymbalta and Gabapentin.              Monitor with increased exertion  -add robaxin for spasms  Controlled on 9/2 4. Mood: LCSW to follow for evaluation and support.              -antipsychotic agents: N/a 5. Neuropsych: This patient is capable of making decisions on her own behalf. 6. Skin/Wound Care: Routine pressure relief measure. Foam dressing to sacrum 7. Fluids/Electrolytes/Nutrition: Intake poor --does not like the food here.             Liberalize diet to regular for flavor.  --Advised husband to bring supplements/food from home 8. MSSA bacteremia w/sepsis:  Zosyn changed to Ancef through 9/22 9. Diskitis: Recommendations are to repeat films next week for follow up             - Appreciate ID recs  10. COPD/Pulmonary septic emboli: On Zosyn-encourage pulmonary toilet with flutter valve--discussed with her today             --continue Incurse inhaler daily             Wean supplemental oxygen as tolerated, continues to require 11. Gout flare: On prednisone taper 12.. Hypokalemia:  . Added supplement as Lasix on board. Potassium 3.0 on 9/1, labs ordered for  Monday Supplementing 13. Neurogenic bladder v/s overflow: Foley d/c'ed 08/28 and she has not been able to control her bladder  UA negative              --needs a few pvr's, is continent. Toilet patient every 3 hours 14.  Loose stools w/incontinence/Neurogenic bowel?: Goes after she eats as well as prn.             --discontinued Miralax --KUB only with gas -no stools recorded yet -added daily fiber    LOS: 2 days A FACE TO FACE EVALUATION WAS PERFORMED  Carnella Fryman Lorie Phenix 07/12/2021, 11:06 AM

## 2021-07-12 NOTE — IPOC Note (Signed)
Overall Plan of Care [facility]) Patient Details Name: Samantha King MRN: [id number] DOB: 21-Jan-1946  Admitting Diagnosis: [facility] Problems: Principal Problem:   Debility Active Problems:   Severe muscle deconditioning   MSSA bacteremia   Loose stools   Supplemental oxygen dependent     Functional Problem List: Nursing Bladder, Endurance, Pain, Medication Management, Safety, Bowel, Skin Integrity  PT Balance, Behavior, Endurance, Motor, Pain, Sensory, Skin Integrity  OT Balance, Endurance, Edema, Pain, Motor, Nutrition, Skin Integrity  SLP    TR         Basic ADL's: OT Grooming, Bathing, Toileting, Dressing     Advanced  ADL's: OT       Transfers: PT Bed Mobility, Car, Furniture, Bed to Education administrator, Metallurgist: PT Ambulation, Stairs, Emergency planning/management officer     Additional Impairments: OT None  SLP        TR      Anticipated Outcomes Item Anticipated Outcome  Self Feeding    Swallowing      Basic self-care  CGA/supervision  Toileting  CGA   Bathroom Transfers CGA  Bowel/Bladder  manage w mod I assist  Transfers  Min A  Locomotion  Mod A  Communication     Cognition     Pain  at or below level 4  Safety/Judgment  maintain w cues/reminders   Therapy Plan: PT Intensity: Minimum of 1-2 x/day ,45 to 90 minutes PT Frequency: 5 out of 7 days PT Duration Estimated Length of Stay: 18-21 days OT Intensity: Minimum of 1-2 x/day, 45 to 90 minutes OT Frequency: 5 out of 7 days OT Duration/Estimated Length of Stay: 3 weeks     Due to the current state of emergency, patients may not be receiving their 3-hours of Medicare-mandated therapy.   Team Interventions: Nursing Interventions Patient/Family Education, Bowel Management, Pain Management, Medication Management, Disease Management/Prevention, Bladder Management, Discharge Planning, Skin Care/Wound Management  PT interventions Ambulation/gait training, Community  reintegration, DME/adaptive equipment instruction, Neuromuscular re-education, Psychosocial support, Stair training, UE/LE Strength taining/ROM, Wheelchair propulsion/positioning, Training and development officer, Discharge planning, Functional electrical stimulation, Pain management, Therapeutic Activities, Skin care/wound management, UE/LE Coordination activities, Therapeutic Exercise, Patient/family education, Functional mobility training, Disease management/prevention, Cognitive remediation/compensation, Splinting/orthotics  OT Interventions Training and development officer, Cognitive remediation/compensation, Community reintegration, Disease mangement/prevention, Discharge planning, DME/adaptive equipment instruction, Functional electrical stimulation, Functional mobility training, Neuromuscular re-education, Pain management, Patient/family education, Psychosocial support, Self Care/advanced ADL retraining, Skin care/wound managment, Splinting/orthotics, Therapeutic Activities, Therapeutic Exercise, UE/LE Strength taining/ROM, UE/LE Coordination activities, Visual/perceptual remediation/compensation, Wheelchair propulsion/positioning  SLP Interventions    TR Interventions    SW/CM Interventions Discharge Planning, Psychosocial Support, Patient/Family Education   Barriers to Discharge MD  Medical stability  Nursing Decreased caregiver support, Home environment access/layout, Wound Care 1 level 3ste right rail w spouse  PT Inaccessible home environment, Neurogenic Bowel & Bladder, Wound Care, Behavior Fear-avoidance behavior  OT      SLP      SW       Team Discharge Planning: Destination: PT-Home ,OT- Home , SLP-Home Projected Follow-up: PT-Home health PT, OT-  Home health OT, SLP-None Projected Equipment Needs: PT-To be determined, OT- To be determined, SLP-  Equipment Details: PT- , OT-  Patient/family involved in discharge planning: PT- Patient,  OT-Family member/caregiver, Patient, SLP-Patient,  Family member/caregiver  MD ELOS: 18-20 days Medical Rehab Prognosis:  Excellent Assessment: The patient has been admitted for CIR therapies with the diagnosis of debility after lumbar discitis, sepsis, and multiple medical  complications. The team will be addressing functional mobility, strength, stamina, balance, safety, adaptive techniques and equipment, self-care, bowel and bladder mgt, patient and caregiver education, pain mgt, community reentr. Goals have been set at contact guard for self-care and min to mod assist with transfers and mobility.   Due to the current state of emergency, patients may not be receiving their 3 hours per day of Medicare-mandated therapy.    Meredith Staggers, MD, FAAPMR     See Team Conference Notes for weekly updates to the plan of care

## 2021-07-13 DIAGNOSIS — R5381 Other malaise: Secondary | ICD-10-CM | POA: Diagnosis not present

## 2021-07-13 LAB — GLUCOSE, CAPILLARY
Glucose-Capillary: 120 mg/dL — ABNORMAL HIGH (ref 70–99)
Glucose-Capillary: 131 mg/dL — ABNORMAL HIGH (ref 70–99)
Glucose-Capillary: 145 mg/dL — ABNORMAL HIGH (ref 70–99)
Glucose-Capillary: 107 mg/dL — ABNORMAL HIGH (ref 70–99)

## 2021-07-13 NOTE — Progress Notes (Signed)
Physical Therapy Session Note  Patient Details  Name: Samantha King MRN: [id number] Date of Birth: July 03, 1946  Today's Date: 07/13/2021 PT Individual Time: 0917-1030 PT Individual Time Calculation (min): 73 min   Short Term Goals: Week 1:  PT Short Term Goal 1 (Week 1): Pt will perform supine <>sit w/min A PT Short Term Goal 2 (Week 1): Pt will perform sit <>stand w/LRAD and mod A PT Short Term Goal 3 (Week 1): Pt will perform bed <>chair transfers mod A w/LRAD PT Short Term Goal 4 (Week 1): Pt will maintain dynamic sitting balance w/no UE support for 2 minutes w/min A  Skilled Therapeutic Interventions/Progress Updates:  Patient supine in bed in hooklying position on entrance to room. Patient alert and agreeable to PT session.   Patient with complaint of painful "hitch" in her L hip with certain movements throughout session. 6/10 pain at most.  Pt has not taken anything for pain but relates having taken a muscle relaxer hours earlier in the morning. Pt educated re: use of pain medication to manage current pain and educated on prescribed frequency. Potential need for maintaining level within body for proper pain mgmt if mediation works for her. RN notified of pt's request for pain medication. RN also alerted to end of IV medication and potential need for IV team to flush and remove line.   Therapeutic Activity: Bed Mobility: Patient performed supine <> sit with vc/ tc for log roll technique in order to reduce pain. Supine to sidelying performed in hooklying with supervision and Mod A provided to UB for rise to seated position on EOB. No dizziness noted or related. Minimal pain in L glute. Pt guided in LLE heel taps and marches for muscle activation. Education provided on core stability prior to movement of BLEs. Transfers: Patient performed sit<>stand transfers to RW throughout session with Mod A initially and improving to CGA from elevated bed surface and with therapist hold to RW.  Provided verbal cues for forward lean to initiate weight shift, full rise to upright posture, level gaze. Pt tending to remain in forward leaned position on rise to stand and requiring vc/ tc with NDT facilitation to complete. Continued education on core stability and coordinated breathing prior to standing/ sitting.   Therapeutic Exercise: Patient guided in supine passive ROM stretching to low back and piriformis with pt related minimal pain relief at site of L glute pain. Gentle stretches held for 30sec x6 for piriformis and SKTC. Pt guided in performance on own throughout day in order to continue to manage muscle ache.   NT recommended to bring ice pack to pt for cryotherapy following stretching for pain mgmt.   Patient supine  in bed at end of session with brakes locked, bed alarm set, and all needs within reach.     Therapy Documentation Precautions:  Precautions Precautions: Fall Restrictions Weight Bearing Restrictions: No  Pain: Pain Assessment Pain Scale: 0-10 Pain Score: 6   Therapy/Group: Individual Therapy  Alger Simons PT, DPT 07/13/2021, 3:25 PM

## 2021-07-13 NOTE — Progress Notes (Signed)
PROGRESS NOTE   Subjective/Complaints: Complaints of left lower back pain that is limiting her therapy- she is going to try ice and feels current pain regimen is sufficient.   ROS: Denies CP, SOB, N/V/D, +left glute pain  Objective:   No results found. Recent Labs    07/11/21 0431  WBC 11.7*  HGB 10.5*  HCT 33.2*  PLT 593*   Recent Labs    07/11/21 0431  NA 138  K 3.0*  CL 100  CO2 30  GLUCOSE 93  BUN 12  CREATININE 0.71  CALCIUM 9.1    Intake/Output Summary (Last 24 hours) at 07/13/2021 1321 Last data filed at 07/13/2021 0845 Gross per 24 hour  Intake 1014 ml  Output --  Net 1014 ml        Physical Exam: Vital Signs Blood pressure 140/78, pulse 75, temperature 97.8 F (36.6 C), resp. rate 16, height '5\' 3"'$  (1.6 m), weight 77.3 kg, SpO2 93 %. Gen: no distress, normal appearing HEENT: oral mucosa pink and moist, NCAT Cardio: Reg rate Chest: normal effort, normal rate of breathing GI: Non-distended.  BS +. Skin: Warm and dry.  Sacrum not examined today Psych: Normal mood.  Normal behavior. Musc: No edema in extremities.  No tenderness in extremities. Neuro: Alert and oriented Motor 4/5 UE and 3-4/5 LE's, unchanged  Assessment/Plan: 1. Functional deficits which require 3+ hours per day of interdisciplinary therapy in a comprehensive inpatient rehab setting. Physiatrist is providing close team supervision and 24 hour management of active medical problems listed below. Physiatrist and rehab team continue to assess barriers to discharge/monitor patient progress toward functional and medical goals  Care Tool:  Bathing    Body parts bathed by patient: Right arm, Left arm, Chest, Abdomen, Front perineal area, Face   Body parts bathed by helper: Left upper leg, Right upper leg, Buttocks, Right lower leg, Left lower leg     Bathing assist Assist Level: Maximal Assistance - Patient 24 - 49%     Upper Body  Dressing/Undressing Upper body dressing   What is the patient wearing?: Pull over shirt    Upper body assist Assist Level: Maximal Assistance - Patient 25 - 49%    Lower Body Dressing/Undressing Lower body dressing      What is the patient wearing?: Incontinence brief     Lower body assist Assist for lower body dressing: Total Assistance - Patient < 25%     Toileting Toileting    Toileting assist Assist for toileting: Dependent - Patient 0%     Transfers Chair/bed transfer  Transfers assist  Chair/bed transfer activity did not occur: Safety/medical concerns  Chair/bed transfer assist level: Total Assistance - Patient < 25%     Locomotion Ambulation   Ambulation assist   Ambulation activity did not occur: Safety/medical concerns          Walk 10 feet activity   Assist  Walk 10 feet activity did not occur: Safety/medical concerns        Walk 50 feet activity   Assist Walk 50 feet with 2 turns activity did not occur: Safety/medical concerns         Walk 150 feet activity  Assist Walk 150 feet activity did not occur: Safety/medical concerns         Walk 10 feet on uneven surface  activity   Assist Walk 10 feet on uneven surfaces activity did not occur: Safety/medical concerns         Wheelchair     Assist Is the patient using a wheelchair?: Yes Type of Wheelchair: Manual Wheelchair activity did not occur: Safety/medical concerns         Wheelchair 50 feet with 2 turns activity    Assist    Wheelchair 50 feet with 2 turns activity did not occur: Safety/medical concerns       Wheelchair 150 feet activity     Assist  Wheelchair 150 feet activity did not occur: Safety/medical concerns       Blood pressure 140/78, pulse 75, temperature 97.8 F (36.6 C), resp. rate 16, height '5\' 3"'$  (1.6 m), weight 77.3 kg, SpO2 93 %.  Medical Problem List and Plan: 1.  Deficits with mobility, endurance, self-care secondary  to debility             Continue CIR 2.  Antithrombotics: -DVT/anticoagulation:  Pharmaceutical: Lovenox             -antiplatelet therapy: Plavix 3. Left buttock pain: Oxycodone prn             --continue Cymbalta and Gabapentin.              Monitor with increased exertion  -add robaxin for spasms  -trial ice  Controlled on 9/3 4. Mood: LCSW to follow for evaluation and support.              -antipsychotic agents: N/a 5. Neuropsych: This patient is capable of making decisions on her own behalf. 6. Skin/Wound Care: Routine pressure relief measure. Foam dressing to sacrum 7. Fluids/Electrolytes/Nutrition: Intake poor --does not like the food here.             Liberalize diet to regular for flavor.  --Advised husband to bring supplements/food from home 8. MSSA bacteremia w/sepsis:  Zosyn changed to Ancef through 9/22 9. Diskitis: Recommendations are to repeat films next week for follow up             - Appreciate ID recs  10. COPD/Pulmonary septic emboli: On Zosyn-encourage pulmonary toilet with flutter valve--discussed with her today             --continue Incurse inhaler daily             Wean supplemental oxygen as tolerated, continues to require 11. Gout flare: Continue prednisone taper 12.. Hypokalemia:  . Added supplement as Lasix on board. Potassium 3.0 on 9/1, labs ordered for Monday Supplementing 13. Neurogenic bladder v/s overflow: Foley d/c'ed 08/28 and she has not been able to control her bladder             UA negative              --needs a few pvr's, is continent. Toilet patient every 3 hours 14.  Loose stools w/incontinence/Neurogenic bowel?: Goes after she eats as well as prn.             --discontinued Miralax --KUB only with gas -no stools recorded yet -continue daily fiber 15. Obesity BMI 30.20: provide dietary counseling.     LOS: 3 days A FACE TO FACE EVALUATION WAS PERFORMED  Pier Laux P Caitrin Pendergraph 07/13/2021, 1:21 PM

## 2021-07-13 NOTE — Progress Notes (Signed)
Occupational Therapy Session Note  Patient Details  Name: Samantha King MRN: [id number] Date of Birth: 01/23/1946  Today's Date: 07/13/2021 OT Individual Time: 1300-1345 and 1415 - 1530 OT Individual Time Calculation (min): 45 min and 75 min   Short Term Goals: Week 1:  OT Short Term Goal 1 (Week 1): Pt will tolerate sitting EOB for 10 minutes within functional BADL task OT Short Term Goal 2 (Week 1): Patient will complete BSC transfer with max A +2 OT Short Term Goal 3 (Week 1): Pt will complete 1 step of LB dressing task  Skilled Therapeutic Interventions/Progress Updates:    Visit 1: Pain: 6/10 low back pain - pt felt she could tolerate doing some therapy. Pt agreeable to working on transfers and see the bathroom set up to discuss having a shower later today. Pt able to come to EOB with close S.  She worked on sit to stand with RW with mod A and cues to wt shift forward.  Once in standing she did well shifting her feet to stand pivot to wc.  Pt taken in bathroom and shown shower set up.  Replaced her shower seat with tub bench.  Got all items needed for shower for 2nd session.  Pt also stated her Low back hurts in wc.  Obtained a supportive back which pt stated was helpful.  Pt resting in wc with belt alarm on and all needs met.     Visit 2: Pain:  low back pain 6-8/10 so pt requested pain pills especially bc she knew she would need to do a lot of transfers.  RN provided medication.   PICC  covered well with water proof barrier.  Pt transported in wc into bathroom. Used bars for support to do scoot transfers onto bench. Sit to stand with close S then had full A to manage clothing.  Pt bathed using long handled sponge for feet and was able to reach all areas except for min with bottom.  During bathing she had several low back spasms that were quite painful for her, but pt did well enduring the shower.  She transferred back to wc and found sit to stands with bar too painful for LB dressing  so used stedy insteady. Unable to trial AE of reacher and sock aid as pt hurting too much so started clothing over feet for her. In stedy, pt able to rise to stand and clothing managed over hips for her.  Adjusted in wc and then pt sat at sink to dry hair and put on her makeup. Pt resting in wc with all needs met.  Belt alarm on.   Therapy Documentation Precautions:  Precautions Precautions: Fall Restrictions Weight Bearing Restrictions: No   ADL: ADL Eating: Set up Grooming: Minimal assistance Upper Body Bathing: Moderate assistance Lower Body Bathing: Dependent Upper Body Dressing: Maximal assistance Lower Body Dressing: Dependent Toileting: Dependent Toilet Transfer: Unable to assess   Therapy/Group: Individual Therapy  , 07/13/2021, 4:11 PM 

## 2021-07-14 DIAGNOSIS — R5381 Other malaise: Secondary | ICD-10-CM | POA: Diagnosis not present

## 2021-07-14 LAB — GLUCOSE, CAPILLARY
Glucose-Capillary: 79 mg/dL (ref 70–99)
Glucose-Capillary: 129 mg/dL — ABNORMAL HIGH (ref 70–99)
Glucose-Capillary: 130 mg/dL — ABNORMAL HIGH (ref 70–99)
Glucose-Capillary: 148 mg/dL — ABNORMAL HIGH (ref 70–99)

## 2021-07-14 NOTE — Progress Notes (Signed)
PROGRESS NOTE   Subjective/Complaints: Doing well Has no new complaints   ROS: Denies CP, SOB, N/V/D, +left glute pain  Objective:   No results found. No results for input(s): WBC, HGB, HCT, PLT in the last 72 hours.  No results for input(s): NA, K, CL, CO2, GLUCOSE, BUN, CREATININE, CALCIUM in the last 72 hours.   Intake/Output Summary (Last 24 hours) at 07/14/2021 1124 Last data filed at 07/14/2021 0904 Gross per 24 hour  Intake 240 ml  Output --  Net 240 ml        Physical Exam: Vital Signs Blood pressure (!) 145/83, pulse 83, temperature 98.1 F (36.7 C), resp. rate 16, height '5\' 3"'$  (1.6 m), weight 77.3 kg, SpO2 98 %. Gen: no distress, normal appearing HEENT: oral mucosa pink and moist, NCAT Cardio: Reg rate Chest: normal effort, normal rate of breathing Abd: soft, non-distended Ext: no edema Psych: pleasant, normal affect Skin: Warm and dry.  Sacrum not examined today Psych: Normal mood.  Normal behavior. Musc: No edema in extremities.  No tenderness in extremities. Neuro: Alert and oriented Motor 4/5 UE and 3-4/5 LE's, unchanged  Assessment/Plan: 1. Functional deficits which require 3+ hours per day of interdisciplinary therapy in a comprehensive inpatient rehab setting. Physiatrist is providing close team supervision and 24 hour management of active medical problems listed below. Physiatrist and rehab team continue to assess barriers to discharge/monitor patient progress toward functional and medical goals  Care Tool:  Bathing    Body parts bathed by patient: Right arm, Left arm, Chest, Abdomen, Front perineal area, Face   Body parts bathed by helper: Left upper leg, Right upper leg, Buttocks, Right lower leg, Left lower leg     Bathing assist Assist Level: Maximal Assistance - Patient 24 - 49%     Upper Body Dressing/Undressing Upper body dressing   What is the patient wearing?: Pull over  shirt    Upper body assist Assist Level: Maximal Assistance - Patient 25 - 49%    Lower Body Dressing/Undressing Lower body dressing      What is the patient wearing?: Incontinence brief     Lower body assist Assist for lower body dressing: Total Assistance - Patient < 25%     Toileting Toileting    Toileting assist Assist for toileting: Dependent - Patient 0%     Transfers Chair/bed transfer  Transfers assist  Chair/bed transfer activity did not occur: Safety/medical concerns  Chair/bed transfer assist level: Total Assistance - Patient < 25%     Locomotion Ambulation   Ambulation assist   Ambulation activity did not occur: Safety/medical concerns          Walk 10 feet activity   Assist  Walk 10 feet activity did not occur: Safety/medical concerns        Walk 50 feet activity   Assist Walk 50 feet with 2 turns activity did not occur: Safety/medical concerns         Walk 150 feet activity   Assist Walk 150 feet activity did not occur: Safety/medical concerns         Walk 10 feet on uneven surface  activity   Assist Walk 10 feet  on uneven surfaces activity did not occur: Safety/medical concerns         Wheelchair     Assist Is the patient using a wheelchair?: Yes Type of Wheelchair: Manual Wheelchair activity did not occur: Safety/medical concerns         Wheelchair 50 feet with 2 turns activity    Assist    Wheelchair 50 feet with 2 turns activity did not occur: Safety/medical concerns       Wheelchair 150 feet activity     Assist  Wheelchair 150 feet activity did not occur: Safety/medical concerns       Blood pressure (!) 145/83, pulse 83, temperature 98.1 F (36.7 C), resp. rate 16, height '5\' 3"'$  (1.6 m), weight 77.3 kg, SpO2 98 %.  Medical Problem List and Plan: 1.  Deficits with mobility, endurance, self-care secondary to debility             Continue CIR 2.   Antithrombotics: -DVT/anticoagulation:  Pharmaceutical: Lovenox             -antiplatelet therapy: Plavix 3. Left buttock pain: Oxycodone prn             --continue Cymbalta and Gabapentin.              Monitor with increased exertion  -add robaxin for spasms  -trial ice  Controlled on 9/4 4. Mood: LCSW to follow for evaluation and support.              -antipsychotic agents: N/a 5. Neuropsych: This patient is capable of making decisions on her own behalf. 6. Skin/Wound Care: Routine pressure relief measure. Foam dressing to sacrum 7. Fluids/Electrolytes/Nutrition: Intake poor --does not like the food here.             Liberalize diet to regular for flavor.  --Advised husband to bring supplements/food from home 8. MSSA bacteremia w/sepsis:  Zosyn changed to Ancef through 9/22 9. Diskitis: Recommendations are to repeat films next week for follow up             - Appreciate ID recs  10. COPD/Pulmonary septic emboli: continue Ancef-encourage pulmonary toilet with flutter valve--discussed with her today             --continue Incurse inhaler daily             Wean supplemental oxygen as tolerated, continues to require 11. Gout flare: Continue prednisone taper 12.. Hypokalemia:  . Added supplement as Lasix on board. Potassium 3.0 on 9/1, labs ordered for Monday Supplementing 13. Neurogenic bladder v/s overflow: Foley d/c'ed 08/28 and she has not been able to control her bladder             UA negative              --needs a few pvr's, is continent. Toilet patient every 3 hours 14.  Loose stools w/incontinence/Neurogenic bowel?: Goes after she eats as well as prn.             --discontinued Miralax --KUB only with gas -no stools recorded yet -continue daily fiber 15. Obesity BMI 30.20: provide dietary counseling.     LOS: 4 days A FACE TO FACE EVALUATION WAS PERFORMED  Haward Pope P Zayleigh Stroh 07/14/2021, 11:24 AM

## 2021-07-15 DIAGNOSIS — R5381 Other malaise: Secondary | ICD-10-CM | POA: Diagnosis not present

## 2021-07-15 LAB — URINALYSIS, ROUTINE W REFLEX MICROSCOPIC
Bilirubin Urine: NEGATIVE
Glucose, UA: NEGATIVE mg/dL
Hgb urine dipstick: NEGATIVE
Ketones, ur: NEGATIVE mg/dL
Leukocytes,Ua: NEGATIVE
Nitrite: NEGATIVE
Protein, ur: NEGATIVE mg/dL
Specific Gravity, Urine: 1.011 (ref 1.005–1.030)
pH: 5 (ref 5.0–8.0)

## 2021-07-15 LAB — CBC WITH DIFFERENTIAL/PLATELET
Abs Immature Granulocytes: 0.09 10*3/uL — ABNORMAL HIGH (ref 0.00–0.07)
Basophils Absolute: 0 10*3/uL (ref 0.0–0.1)
Basophils Relative: 0 %
Eosinophils Absolute: 0.2 10*3/uL (ref 0.0–0.5)
Eosinophils Relative: 1 %
HCT: 35 % — ABNORMAL LOW (ref 36.0–46.0)
Hemoglobin: 11.1 g/dL — ABNORMAL LOW (ref 12.0–15.0)
Immature Granulocytes: 1 %
Lymphocytes Relative: 14 %
Lymphs Abs: 2 10*3/uL (ref 0.7–4.0)
MCH: 29.4 pg (ref 26.0–34.0)
MCHC: 31.7 g/dL (ref 30.0–36.0)
MCV: 92.8 fL (ref 80.0–100.0)
Monocytes Absolute: 1.3 10*3/uL — ABNORMAL HIGH (ref 0.1–1.0)
Monocytes Relative: 9 %
Neutro Abs: 11.3 10*3/uL — ABNORMAL HIGH (ref 1.7–7.7)
Neutrophils Relative %: 75 %
Platelets: 509 10*3/uL — ABNORMAL HIGH (ref 150–400)
RBC: 3.77 MIL/uL — ABNORMAL LOW (ref 3.87–5.11)
RDW: 18.2 % — ABNORMAL HIGH (ref 11.5–15.5)
WBC: 14.9 10*3/uL — ABNORMAL HIGH (ref 4.0–10.5)
nRBC: 0 % (ref 0.0–0.2)

## 2021-07-15 LAB — SEDIMENTATION RATE: Sed Rate: 57 mm/hr — ABNORMAL HIGH (ref 0–22)

## 2021-07-15 LAB — COMPREHENSIVE METABOLIC PANEL
ALT: 19 U/L (ref 0–44)
AST: 35 U/L (ref 15–41)
Albumin: 2.4 g/dL — ABNORMAL LOW (ref 3.5–5.0)
Alkaline Phosphatase: 108 U/L (ref 38–126)
Anion gap: 7 (ref 5–15)
BUN: 20 mg/dL (ref 8–23)
CO2: 27 mmol/L (ref 22–32)
Calcium: 9.3 mg/dL (ref 8.9–10.3)
Chloride: 101 mmol/L (ref 98–111)
Creatinine, Ser: 0.65 mg/dL (ref 0.44–1.00)
GFR, Estimated: >60 mL/min (ref >60)
Glucose, Bld: 97 mg/dL (ref 70–99)
Potassium: 4 mmol/L (ref 3.5–5.1)
Sodium: 135 mmol/L (ref 135–145)
Total Bilirubin: 0.4 mg/dL (ref 0.3–1.2)
Total Protein: 5.7 g/dL — ABNORMAL LOW (ref 6.5–8.1)

## 2021-07-15 LAB — C-REACTIVE PROTEIN: CRP: 4.2 mg/dL — ABNORMAL HIGH (ref <1.0)

## 2021-07-15 LAB — GLUCOSE, CAPILLARY
Glucose-Capillary: 119 mg/dL — ABNORMAL HIGH (ref 70–99)
Glucose-Capillary: 137 mg/dL — ABNORMAL HIGH (ref 70–99)
Glucose-Capillary: 174 mg/dL — ABNORMAL HIGH (ref 70–99)
Glucose-Capillary: 109 mg/dL — ABNORMAL HIGH (ref 70–99)

## 2021-07-15 NOTE — Progress Notes (Signed)
Occupational Therapy Session Note  Patient Details  Name: Samantha King MRN: [id number] Date of Birth: 1946-01-20  Today's Date: 07/15/2021 OT Individual Time: 1030-1130 OT Individual Time Calculation (min): 60 min    Short Term Goals: Week 1:  OT Short Term Goal 1 (Week 1): Pt will tolerate sitting EOB for 10 minutes within functional BADL task OT Short Term Goal 2 (Week 1): Patient will complete BSC transfer with max A +2 OT Short Term Goal 3 (Week 1): Pt will complete 1 step of LB dressing task  Skilled Therapeutic Interventions/Progress Updates:    Pt received in wc stating she did use bathroom 2x earlier but needed to use again.  Encouraged pt to work on transfers without stedy and she was agreeable. W/c moved adjacent to toilet and reviewed safe sit to stand and pivot with pt by pushing up from wc arm rest on R and placing L hand on grab bar. Pt was then able to rise to stand with mod A and pivot to toilet.  Had her stand again with RW so I could assist with pants.  Pt cued to count out loud to 30 as a standing time goal to allow for enough time to manage clothing and to distract pt. She did well with this.  Sat on toilet (with elevated seat with BSC) and voided BM.  Repeated sit to stand process with RW 2x for cleansing and clothing. (Pt able to cleanse front area). In sitting, she braces herself with her arms and then when she has to move an arm, she has a back spasm. Cued her to abdominal brace then move an arm.  This did help her with decreased pain with rising to stand and step pivoting with RW to wc.   Pt did very well at mod A overall.   Discussed and practiced pain remediation with visualization using ocean waves so focus on the spasms rolling out of her like waves. Pt liked this idea and does want to try to utilize in therapy.  Pt resting in wc with belt alarm on and all needs met.     Therapy Documentation Precautions:  Precautions Precautions:  Fall Restrictions Weight Bearing Restrictions: No    Vital Signs: Oxygen Therapy SpO2: 99 % O2 Device: Nasal Cannula O2 Flow Rate (L/min): 3 L/min Pain: Intermittent back spasms that are 10/10 pain for pt - focused on rest and deep breathing.         Therapy/Group: Individual Therapy  [place] 07/15/2021, 8:27 AM

## 2021-07-15 NOTE — Progress Notes (Signed)
Physical Therapy Session Note  Patient Details  Name: Samantha King MRN: [id number] Date of Birth: [birth year]  Today's Date: 07/15/2021 PT Individual Time: 1301-1400 PT Individual Time Calculation (min): 59 min   Short Term Goals: Week 1:  PT Short Term Goal 1 (Week 1): Pt will perform supine <>sit w/min A PT Short Term Goal 2 (Week 1): Pt will perform sit <>stand w/LRAD and mod A PT Short Term Goal 3 (Week 1): Pt will perform bed <>chair transfers mod A w/LRAD PT Short Term Goal 4 (Week 1): Pt will maintain dynamic sitting balance w/no UE support for 2 minutes w/min A  Skilled Therapeutic Interventions/Progress Updates:   Pt received sitting in WC and agreeable to PT. Pt transported to rehab gym in [facility]. Sit<>stand transfers with min assist x 6 with cues for deep core activation and pursed lip breathing to reduce back spasms. Gait training in parallel bars x 36f, 585fand [id number]/reverse with min assist from PT for safety to block knees initially, but no instability noted.   Pt transported to day room. Kinetron BLE marching 6 x 30sec , 40-30 cm/sec with 1 min therapeutic rest break between bouts due to LE weakness and mild back pain 3/10 at rest. PT educated pt on pain management. Patient returned to room and [facility] reach and all needs met.          Therapy Documentation Precautions:  Precautions Precautions: Fall Restrictions Weight Bearing Restrictions: No   Therapy/Group: Individual Therapy  AusLorie Phenix5/2022, 6:35 PM

## 2021-07-15 NOTE — Progress Notes (Signed)
PROGRESS NOTE   Subjective/Complaints:  Pt reports feels good overall- however having stinging when voids- and burning.  Only notices it when voids.   Back still hurting, but expects that.    ROS:  Pt denies SOB, abd pain, CP, N/V/C/D, and vision changes   Objective:   No results found. Recent Labs    07/15/21 0354  WBC 14.9*  HGB 11.1*  HCT 35.0*  PLT 509*    Recent Labs    07/15/21 0354  NA 135  K 4.0  CL 101  CO2 27  GLUCOSE 97  BUN 20  CREATININE 0.65  CALCIUM 9.3     Intake/Output Summary (Last 24 hours) at 07/15/2021 1201 Last data filed at 07/15/2021 0803 Gross per 24 hour  Intake 610 ml  Output --  Net 610 ml        Physical Exam: Vital Signs Blood pressure (!) 140/97, pulse 73, temperature 98.3 F (36.8 C), resp. rate 14, height 5' 3"  (1.6 m), weight 77.3 kg, SpO2 99 %.   General: awake, alert, appropriate,  on toilet voiding with OT at side; using Steady to get onto toilet; NAD HENT: conjugate gaze; oropharynx moist CV: regular rate; no JVD Pulmonary: CTA B/L; no W/R/R- good air movement GI: soft, NT, ND, (+)BS Psychiatric: appropriate; interactive; smiling;  Neurological: Ox3  Skin: Warm and dry.  Sacrum not examined today Musc: No edema in extremities.  No tenderness in extremities. Neuro: Alert and oriented Motor 4/5 UE and 3-4/5 LE's, unchanged  Assessment/Plan: 1. Functional deficits which require 3+ hours per day of interdisciplinary therapy in a comprehensive inpatient rehab setting. Physiatrist is providing close team supervision and 24 hour management of active medical problems listed below. Physiatrist and rehab team continue to assess barriers to discharge/monitor patient progress toward functional and medical goals  Care Tool:  Bathing    Body parts bathed by patient: Right arm, Left arm, Chest, Abdomen, Front perineal area, Face   Body parts bathed by helper:  Left upper leg, Right upper leg, Buttocks, Right lower leg, Left lower leg     Bathing assist Assist Level: Maximal Assistance - Patient 24 - 49%     Upper Body Dressing/Undressing Upper body dressing   What is the patient wearing?: Pull over shirt    Upper body assist Assist Level: Maximal Assistance - Patient 25 - 49%    Lower Body Dressing/Undressing Lower body dressing      What is the patient wearing?: Incontinence brief     Lower body assist Assist for lower body dressing: Total Assistance - Patient < 25%     Toileting Toileting    Toileting assist Assist for toileting: Dependent - Patient 0%     Transfers Chair/bed transfer  Transfers assist  Chair/bed transfer activity did not occur: Safety/medical concerns  Chair/bed transfer assist level: Total Assistance - Patient < 25%     Locomotion Ambulation   Ambulation assist   Ambulation activity did not occur: Safety/medical concerns          Walk 10 feet activity   Assist  Walk 10 feet activity did not occur: Safety/medical concerns  Walk 50 feet activity   Assist Walk 50 feet with 2 turns activity did not occur: Safety/medical concerns         Walk 150 feet activity   Assist Walk 150 feet activity did not occur: Safety/medical concerns         Walk 10 feet on uneven surface  activity   Assist Walk 10 feet on uneven surfaces activity did not occur: Safety/medical concerns         Wheelchair     Assist Is the patient using a wheelchair?: Yes Type of Wheelchair: Manual Wheelchair activity did not occur: Safety/medical concerns         Wheelchair 50 feet with 2 turns activity    Assist    Wheelchair 50 feet with 2 turns activity did not occur: Safety/medical concerns       Wheelchair 150 feet activity     Assist  Wheelchair 150 feet activity did not occur: Safety/medical concerns       Blood pressure (!) 140/97, pulse 73, temperature 98.3  F (36.8 C), resp. rate 14, height 5' 3"  (1.6 m), weight 77.3 kg, SpO2 99 %.  Medical Problem List and Plan: 1.  Deficits with mobility, endurance, self-care secondary to debility from osteomyelitis/discitis/septic shock/septic emboli/presumed endocarditis              2.  Antithrombotics: -DVT/anticoagulation:  Pharmaceutical: Lovenox             -antiplatelet therapy: Plavix 3. Left buttock pain: Oxycodone prn             --continue Cymbalta and Gabapentin.              Monitor with increased exertion  -add robaxin for spasms  -trial ice  9/5- Pt reports still having back pain, but tolerable- con't regimen- has Oxy 5 mg q4 hours prn 4. Mood: LCSW to follow for evaluation and support.              -antipsychotic agents: N/a 5. Neuropsych: This patient is capable of making decisions on her own behalf. 6. Skin/Wound Care: Routine pressure relief measure. Foam dressing to sacrum 7. Fluids/Electrolytes/Nutrition: Intake poor --does not like the food here.             Liberalize diet to regular for flavor.  --Advised husband to bring supplements/food from home 8. MSSA bacteremia w/sepsis:  Zosyn changed to Ancef through 9/22  9/5- ESR 57 down from 87- WBC elevated to 14.9- think UTI?  9. Diskitis: Recommendations are to repeat films next week for follow up             - Appreciate ID recs  10. COPD/Pulmonary septic emboli: continue Ancef-encourage pulmonary toilet with flutter valve--discussed with her today             --continue Incurse inhaler daily             Wean supplemental oxygen as tolerated, continues to require 11. Gout flare: Continue prednisone taper 12.. Hypokalemia:  . Added supplement as Lasix on board. Potassium 3.0 on 9/1, labs ordered for Monday  9/5- K+ 4.0- con't to monitor Supplementing 13. Neurogenic bladder v/s overflow: Foley d/c'ed 08/28 and she has not been able to control her bladder             UA negative              --needs a few pvr's, is continent.  Toilet patient every 3 hours 14.  Loose  stools w/incontinence/Neurogenic bowel?: Goes after she eats as well as prn.             --discontinued Miralax --KUB only with gas -no stools recorded yet -continue daily fiber 15. Obesity BMI 30.20: provide dietary counseling.  16. Dysuria  9/5- will check U/A and Cx- since likely has UTI with burning/stinging and more leukocytosis- results pending- likely will need ABX more than Ancef she's receiving for  sepsis as above-     LOS: 5 days A FACE TO FACE EVALUATION WAS PERFORMED  Shara Hartis 07/15/2021, 12:01 PM

## 2021-07-15 NOTE — Progress Notes (Signed)
Physical Therapy Session Note  Patient Details  Name: Samantha King MRN: [id number] Date of Birth: Jan 20, 1946  Today's Date: 07/15/2021 PT Individual Time: 0800-0904; [id number] PT Individual Time Calculation (min): 64 min and 30 min  Short Term Goals: Week 1:  PT Short Term Goal 1 (Week 1): Pt will perform supine <>sit w/min A PT Short Term Goal 2 (Week 1): Pt will perform sit <>stand w/LRAD and mod A PT Short Term Goal 3 (Week 1): Pt will perform bed <>chair transfers mod A w/LRAD PT Short Term Goal 4 (Week 1): Pt will maintain dynamic sitting balance w/no UE support for 2 minutes w/min A  Skilled Therapeutic Interventions/Progress Updates:  Session 1 Pt received hooklying in bed, reported pain as 6/10 in low back, had not received pain meds. Nursing notified to administer pain meds. Supine <>sit w/CGA and use of bedrail. Pt received pain meds and lidocaine patch while sitting EOB. Pt very motivated for therapy but simultaneously anxious, frequently asks for feedback about her performance. Provided encouragement on performance and educated her on benefit of exercise and positional changes on pain management. Sit <>stand w/Stedy and min A for trunk extension. Pt transported to [facility] in bathroom and performed stand <>sit w/CGA. Pt voided continently and therapist performed peri care w/total A. Pt reported burning and irritation while urinating, Dr. Dagoberto Ligas present for rounds and notified. Sit <>stand from [facility] to [facility] w/min A and pt transported to [facility] in [place]. Stand <>sit from [facility] to [facility] w/CGA. Sit <>stand from [facility] to Stedy to don pants w/max A. Pt was left sitting in WC in room w/all needs in reach, reported pain as 4/10 in low back, ice pack on low back for pain relief.   Session 2  Pt received sitting in WC in room, reported pain as 4/10 in low back. Majority of session spent answering pt's questions regarding team conference, PT predictions for mobility, and pain management techniques. Pt  performed single sit <>stand from WC to RW w/min A and heavy verbal cues for hand placement, breathing techniques, core bracing and encouragement. Pt required multimodal cues to achieve full trunk extension, as she demonstrates tendency to reach for front bar on RW and maintain flexed trunk. Pt stood for 45s w/min A and performed stand <>sit w/CGA. Pt was left sitting in WC in room, husband and son present, denied pain and all needs in reach.   Therapy Documentation Precautions:  Precautions Precautions: Fall Restrictions Weight Bearing Restrictions: No    Therapy/Group: Individual Therapy Cruzita Lederer Juris Gosnell, PT, DPT  07/15/2021, 7:38 AM

## 2021-07-15 NOTE — Plan of Care (Signed)
  Problem: Consults Goal: RH SPINAL CORD INJURY PATIENT EDUCATION Description:  See Patient Education module for education specifics.  Outcome: Progressing   Problem: SCI BOWEL ELIMINATION Goal: RH STG MANAGE BOWEL WITH ASSISTANCE Description: STG Manage Bowel with  Mod I assistance. Outcome: Progressing Goal: RH STG SCI MANAGE BOWEL WITH MEDICATION WITH ASSISTANCE Description: STG SCI Manage bowel with medication with mod I assistance. Outcome: Progressing Goal: RH STG SCI MANAGE BOWEL PROGRAM W/ASSIST OR AS APPROPRIATE Description: STG SCI Manage bowel program w/ min assist or as appropriate. Outcome: Progressing   Problem: SCI BLADDER ELIMINATION Goal: RH STG MANAGE BLADDER WITH ASSISTANCE Description: STG Manage Bladder With mod I Assistance Outcome: Progressing Goal: RH STG MANAGE BLADDER WITH EQUIPMENT WITH ASSISTANCE Description: STG Manage Bladder With Equipment With min Assistance Outcome: Progressing Goal: RH STG SCI MANAGE BLADDER PROGRAM W/ASSISTANCE Description: W mod I assist Outcome: Progressing   Problem: RH SKIN INTEGRITY Goal: RH STG SKIN FREE OF INFECTION/BREAKDOWN Description: W min assist Outcome: Progressing Goal: RH STG MAINTAIN SKIN INTEGRITY WITH ASSISTANCE Description: STG Maintain Skin Integrity With min Assistance. Outcome: Progressing   Problem: RH SAFETY Goal: RH STG ADHERE TO SAFETY PRECAUTIONS W/ASSISTANCE/DEVICE Description: STG Adhere to Safety Precautions With cues/Assistance/Device. Outcome: Progressing   Problem: RH PAIN MANAGEMENT Goal: RH STG PAIN MANAGED AT OR BELOW PT'S PAIN GOAL Description: At or below level 4 Outcome: Progressing   Problem: RH KNOWLEDGE DEFICIT SCI Goal: RH STG INCREASE KNOWLEDGE OF SELF CARE AFTER SCI Description: W min assist Outcome: Progressing

## 2021-07-16 ENCOUNTER — Inpatient Hospital Stay (HOSPITAL_COMMUNITY): Payer: Medicare Other

## 2021-07-16 DIAGNOSIS — R5381 Other malaise: Secondary | ICD-10-CM | POA: Diagnosis not present

## 2021-07-16 LAB — BASIC METABOLIC PANEL
Anion gap: 6 (ref 5–15)
BUN: 25 mg/dL — ABNORMAL HIGH (ref 8–23)
CO2: 26 mmol/L (ref 22–32)
Calcium: 9.2 mg/dL (ref 8.9–10.3)
Chloride: 104 mmol/L (ref 98–111)
Creatinine, Ser: 0.62 mg/dL (ref 0.44–1.00)
GFR, Estimated: >60 mL/min (ref >60)
Glucose, Bld: 100 mg/dL — ABNORMAL HIGH (ref 70–99)
Potassium: 4.1 mmol/L (ref 3.5–5.1)
Sodium: 136 mmol/L (ref 135–145)

## 2021-07-16 LAB — CBC WITH DIFFERENTIAL/PLATELET
Abs Immature Granulocytes: 0.11 K/uL — ABNORMAL HIGH (ref 0.00–0.07)
Basophils Absolute: 0 K/uL (ref 0.0–0.1)
Basophils Relative: 0 %
Eosinophils Absolute: 0.1 K/uL (ref 0.0–0.5)
Eosinophils Relative: 1 %
HCT: 33.5 % — ABNORMAL LOW (ref 36.0–46.0)
Hemoglobin: 10.3 g/dL — ABNORMAL LOW (ref 12.0–15.0)
Immature Granulocytes: 1 %
Lymphocytes Relative: 16 %
Lymphs Abs: 2.2 K/uL (ref 0.7–4.0)
MCH: 28.8 pg (ref 26.0–34.0)
MCHC: 30.7 g/dL (ref 30.0–36.0)
MCV: 93.6 fL (ref 80.0–100.0)
Monocytes Absolute: 1.3 K/uL — ABNORMAL HIGH (ref 0.1–1.0)
Monocytes Relative: 9 %
Neutro Abs: 10.3 K/uL — ABNORMAL HIGH (ref 1.7–7.7)
Neutrophils Relative %: 73 %
Platelets: 471 K/uL — ABNORMAL HIGH (ref 150–400)
RBC: 3.58 MIL/uL — ABNORMAL LOW (ref 3.87–5.11)
RDW: 18.6 % — ABNORMAL HIGH (ref 11.5–15.5)
WBC: 14.1 K/uL — ABNORMAL HIGH (ref 4.0–10.5)
nRBC: 0 % (ref 0.0–0.2)

## 2021-07-16 LAB — GLUCOSE, CAPILLARY
Glucose-Capillary: 95 mg/dL (ref 70–99)
Glucose-Capillary: 114 mg/dL — ABNORMAL HIGH (ref 70–99)
Glucose-Capillary: 147 mg/dL — ABNORMAL HIGH (ref 70–99)
Glucose-Capillary: 116 mg/dL — ABNORMAL HIGH (ref 70–99)

## 2021-07-16 LAB — URINE CULTURE

## 2021-07-16 IMAGING — DX DG CHEST 1V PORT
1 series · 1 of 1 positions shown · non-contrast
Comparison: [DATE]

CLINICAL DATA: Shortness of breath.  Cough.

EXAM:
PORTABLE CHEST 1 VIEW

[chest]
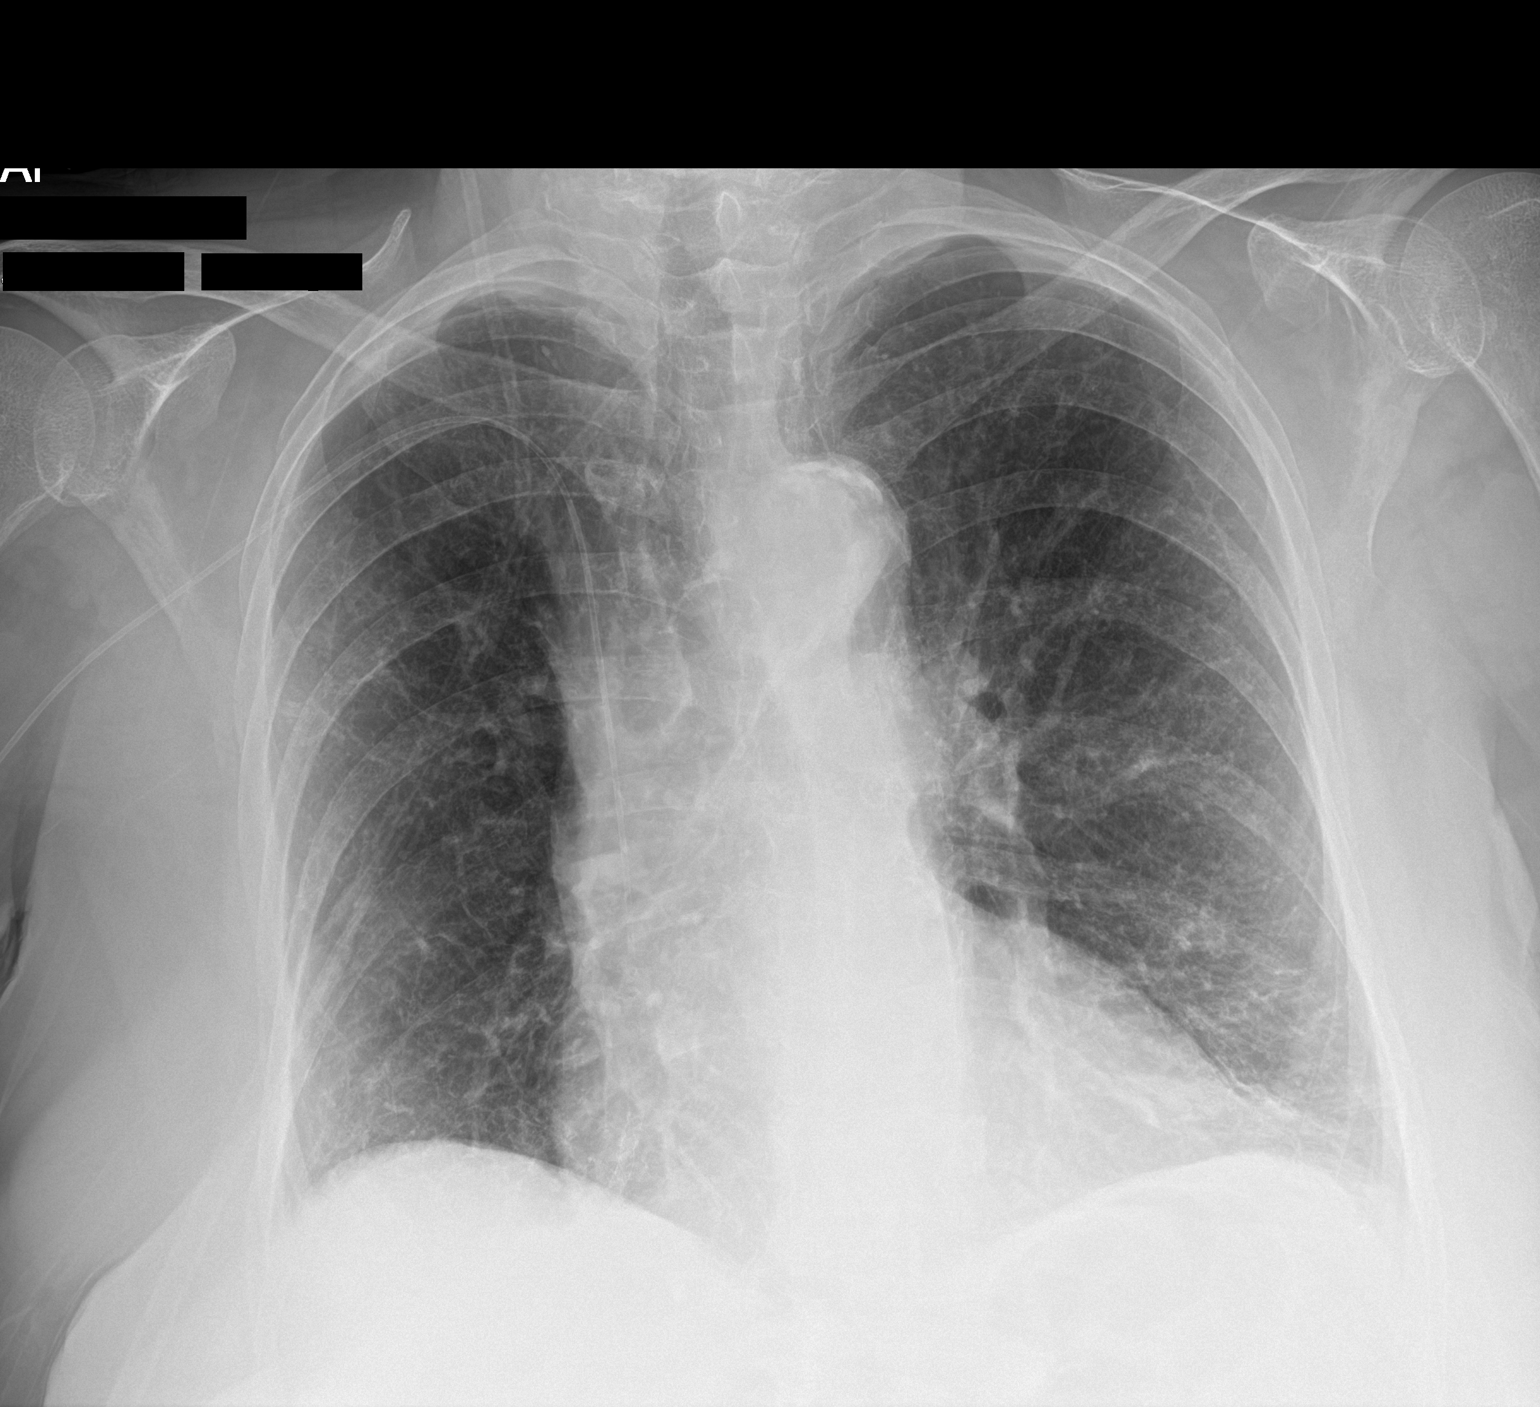

[1 of 1 positions shown; findings below may reference images not displayed]

FINDINGS: Patient rotated right. Right-sided PICC line terminates at the
superior caval/atrial junction. Mild cardiomegaly. Atherosclerosis
in the transverse aorta. No pleural effusion or pneumothorax.
Diffuse peribronchial thickening. Mild volume loss and subsegmental
atelectasis or scarring at the left lung base.
IMPRESSION: No acute cardiopulmonary disease.

Peribronchial thickening which may relate to chronic bronchitis or
smoking.

Aortic Atherosclerosis ([DP]-[DP]).

## 2021-07-16 MED ORDER — PHENAZOPYRIDINE HCL 100 MG PO TABS
100.0000 mg | ORAL_TABLET | Freq: Three times a day (TID) | ORAL | Status: AC
  Administered 2021-07-16 – 2021-07-17 (×6): 100 mg via ORAL
  Filled 2021-07-16 (×6): qty 1

## 2021-07-16 MED ORDER — DULOXETINE HCL 60 MG PO CPEP
60.0000 mg | ORAL_CAPSULE | Freq: Every day | ORAL | Status: DC
  Administered 2021-07-17 – 2021-08-01 (×16): 60 mg via ORAL
  Filled 2021-07-16 (×16): qty 1

## 2021-07-16 MED ORDER — BUSPIRONE HCL 5 MG PO TABS
5.0000 mg | ORAL_TABLET | Freq: Three times a day (TID) | ORAL | Status: DC
  Administered 2021-07-16 – 2021-08-01 (×48): 5 mg via ORAL
  Filled 2021-07-16 (×48): qty 1

## 2021-07-16 NOTE — Progress Notes (Signed)
PROGRESS NOTE   Subjective/Complaints:  Pt reports oozing from lovenox shot- advised nursing to be clear where injecting to avoid a vessel.  Is still on 1L O2- CXR (-) for issues.  Back hurts really bad per therapy- pt said "tolerable", but is really limiting therapy.   Also anxiety is impacting therapy- willing to try something.     ROS:   Pt denies SOB, abd pain, CP, N/V/C/D, and vision changes    Objective:   DG CHEST PORT 1 VIEW  Result Date: 07/16/2021 CLINICAL DATA:  Shortness of breath.  Cough. EXAM: PORTABLE CHEST 1 VIEW COMPARISON:  06/27/2021 FINDINGS: Patient rotated right. Right-sided PICC line terminates at the superior caval/atrial junction. Mild cardiomegaly. Atherosclerosis in the transverse aorta. No pleural effusion or pneumothorax. Diffuse peribronchial thickening. Mild volume loss and subsegmental atelectasis or scarring at the left lung base. IMPRESSION: No acute cardiopulmonary disease. Peribronchial thickening which may relate to chronic bronchitis or smoking. Aortic Atherosclerosis (ICD10-I70.0). Electronically Signed   By: Abigail Miyamoto M.D.   On: 07/16/2021 11:50   Recent Labs    07/15/21 0354 07/16/21 0311  WBC 14.9* 14.1*  HGB 11.1* 10.3*  HCT 35.0* 33.5*  PLT 509* 471*    Recent Labs    07/15/21 0354 07/16/21 0311  NA 135 136  K 4.0 4.1  CL 101 104  CO2 27 26  GLUCOSE 97 100*  BUN 20 25*  CREATININE 0.65 0.62  CALCIUM 9.3 9.2     Intake/Output Summary (Last 24 hours) at 07/16/2021 1805 Last data filed at 07/16/2021 1743 Gross per 24 hour  Intake 1630.1 ml  Output --  Net 1630.1 ml        Physical Exam: Vital Signs Blood pressure (!) 145/70, pulse 74, temperature (!) 97.5 F (36.4 C), temperature source Oral, resp. rate 19, height 5' 3"  (1.6 m), weight 77.3 kg, SpO2 96 %.     General: awake, alert, appropriate, sitting up on EOB; NAD HENT: conjugate gaze; oropharynx  moist CV: regular rate; no JVD Pulmonary: CTA B/L; no W/R/R- good air movement GI: soft, NT, ND, (+)BS; has a small spot oozing from abd from lovenox shot.  Psychiatric: appropriate Neurological: Ox3   Skin: Warm and dry.  Sacrum not examined today Musc: No edema in extremities.  No tenderness in extremities. Neuro: Alert and oriented Motor 4/5 UE and 3-4/5 LE's, unchanged  Assessment/Plan: 1. Functional deficits which require 3+ hours per day of interdisciplinary therapy in a comprehensive inpatient rehab setting. Physiatrist is providing close team supervision and 24 hour management of active medical problems listed below. Physiatrist and rehab team continue to assess barriers to discharge/monitor patient progress toward functional and medical goals  Care Tool:  Bathing    Body parts bathed by patient: Right arm, Left arm, Chest, Abdomen, Front perineal area, Face, Right upper leg, Left upper leg   Body parts bathed by helper: Right lower leg, Left lower leg, Buttocks, Face     Bathing assist Assist Level: Moderate Assistance - Patient 50 - 74%     Upper Body Dressing/Undressing Upper body dressing   What is the patient wearing?: Pull over shirt    Upper body  assist Assist Level: Minimal Assistance - Patient > 75%    Lower Body Dressing/Undressing Lower body dressing      What is the patient wearing?: Incontinence brief, Pants     Lower body assist Assist for lower body dressing: Maximal Assistance - Patient 25 - 49%     Toileting Toileting    Toileting assist Assist for toileting: Maximal Assistance - Patient 25 - 49%     Transfers Chair/bed transfer  Transfers assist  Chair/bed transfer activity did not occur: Safety/medical concerns  Chair/bed transfer assist level: Moderate Assistance - Patient 50 - 74%     Locomotion Ambulation   Ambulation assist   Ambulation activity did not occur: Safety/medical concerns  Assist level: Minimal Assistance  - Patient > 75% Assistive device: Walker-rolling (and maxi sky) Max distance: 20'   Walk 10 feet activity   Assist  Walk 10 feet activity did not occur: Safety/medical concerns  Assist level: Minimal Assistance - Patient > 75% Assistive device: Walker-rolling, Maxi Sky   Walk 50 feet activity   Assist Walk 50 feet with 2 turns activity did not occur: Safety/medical concerns         Walk 150 feet activity   Assist Walk 150 feet activity did not occur: Safety/medical concerns         Walk 10 feet on uneven surface  activity   Assist Walk 10 feet on uneven surfaces activity did not occur: Safety/medical concerns         Wheelchair     Assist Is the patient using a wheelchair?: Yes Type of Wheelchair: Manual Wheelchair activity did not occur: Safety/medical concerns         Wheelchair 50 feet with 2 turns activity    Assist    Wheelchair 50 feet with 2 turns activity did not occur: Safety/medical concerns       Wheelchair 150 feet activity     Assist  Wheelchair 150 feet activity did not occur: Safety/medical concerns       Blood pressure (!) 145/70, pulse 74, temperature (!) 97.5 F (36.4 C), temperature source Oral, resp. rate 19, height 5' 3"  (1.6 m), weight 77.3 kg, SpO2 96 %.  Medical Problem List and Plan: 1.  Deficits with mobility, endurance, self-care secondary to debility from osteomyelitis/discitis/septic shock/septic emboli/presumed endocarditis             9/6- team conference today- d/c date 9/22- con't PT and OT 2.  Antithrombotics: -DVT/anticoagulation:  Pharmaceutical: Lovenox             -antiplatelet therapy: Plavix 3. Left buttock pain/back pain- : Oxycodone prn             --continue Cymbalta and Gabapentin.              Monitor with increased exertion  -add robaxin for spasms  -trial ice  9/5- Pt reports still having back pain, but tolerable- con't regimen- has Oxy 5 mg q4 hours prn  9/6- will increase  cymbalta to 60 mg daily- and will d/w her tomorrow 4. Mood: LCSW to follow for evaluation and support.   9/6- will start Buspar 5 mg TID for anxiety.              -antipsychotic agents: N/a 5. Neuropsych: This patient is capable of making decisions on her own behalf. 6. Skin/Wound Care: Routine pressure relief measure. Foam dressing to sacrum 7. Fluids/Electrolytes/Nutrition: Intake poor --does not like the food here.  Liberalize diet to regular for flavor.  --Advised husband to bring supplements/food from home 8. MSSA bacteremia w/sepsis:  Zosyn changed to Ancef through 9/22  9/5- ESR 57 down from 87- WBC elevated to 14.9- think UTI?   9/6- WBC still up- CXR (-)- will call ID in AM? 9. Diskitis: Recommendations are to repeat films next week for follow up  9/6- Will get MRI this week             - Appreciate ID recs  10. COPD/Pulmonary septic emboli: continue Ancef-encourage pulmonary toilet with flutter valve--discussed with her today             --continue Incurse inhaler daily             Wean supplemental oxygen as tolerated, continues to require  9/6- will con't to wean- at 1`L 11. Gout flare: Continue prednisone taper 12.. Hypokalemia:  . Added supplement as Lasix on board. Potassium 3.0 on 9/1, labs ordered for Monday  9/5- K+ 4.0- con't to monitor Supplementing 13. Neurogenic bladder v/s overflow: Foley d/c'ed 08/28 and she has not been able to control her bladder             UA negative              --needs a few pvr's, is continent. Toilet patient every 3 hours 14.  Loose stools w/incontinence/Neurogenic bowel?: Goes after she eats as well as prn.             --discontinued Miralax --KUB only with gas -no stools recorded yet -continue daily fiber 15. Obesity BMI 30.20: provide dietary counseling.  16. Leukocytosis  9/5- will check U/A and Cx- since likely has UTI with burning/stinging and more leukocytosis- results pending- likely will need ABX more than Ancef  she's receiving for  sepsis as above-   9/6- CXR is negative- will call ID tomorrow and recheck labs in AM- when calls ID, will have more idea of what's going on?   I spent a total of 36 minutes on total care- >50% on coordination of care,.  LOS: 6 days A FACE TO FACE EVALUATION WAS PERFORMED  Samantha King 07/16/2021, 6:05 PM

## 2021-07-16 NOTE — Progress Notes (Signed)
Physical Therapy Session Note  Patient Details  Name: Samantha King MRN: [id number] Date of Birth: 10-15-1946  Today's Date: 07/16/2021 PT Individual Time: 1300-1400 PT Individual Time Calculation (min): 60 min   Short Term Goals: Week 1:  PT Short Term Goal 1 (Week 1): Pt will perform supine <>sit w/min A PT Short Term Goal 2 (Week 1): Pt will perform sit <>stand w/LRAD and mod A PT Short Term Goal 3 (Week 1): Pt will perform bed <>chair transfers mod A w/LRAD PT Short Term Goal 4 (Week 1): Pt will maintain dynamic sitting balance w/no UE support for 2 minutes w/min A  Skilled Therapeutic Interventions/Progress Updates:    Patient in w/c with son and spouse in the room.  Requesting to toilet.  Assisted with Stedy to bathroom and max A for clothing management, but pt able to wipe in sitting when Stedy removed for hygiene.  Patient transferred in [place] to wash hands at sink with S.  Patient assisted in w/c to therapy gym.  Performed squat pivot transfer to mat after demonstration with min A.  Seated assisted to don sling for walking with [facility].  Patient sit to stand to RW min A from EOM and ambulated 20' with RW and min A with sling for comfort.  Patient relates had muscle relaxer and pain medication prior to session which helped pain.  Seated rest EOM with therapy ball at back for lumbo-pelvic relaxation.  Patient ambulated another 90' with RW with min A and Maxi Sky for safety.  Patient assisted in w/c to room.  Left seated in w/c with call bell in reach, seat belt alarm active.   Therapy Documentation Precautions:  Precautions Precautions: Fall Restrictions Weight Bearing Restrictions: No Pain: Pain Assessment Pain Score: 0-No pain Faces Pain Scale: Hurts little more Pain Type: Acute pain Pain Location: Back Pain Descriptors / Indicators: Discomfort Pain Onset: With Activity Pain Intervention(s): Emotional support;Distraction;Ambulation/increased  activity    Therapy/Group: Individual Therapy  Reginia Naas Magda Kiel, PT 07/16/2021, 4:15 PM

## 2021-07-16 NOTE — Progress Notes (Signed)
Patient ID: Samantha King, female   DOB: Nov 13, 1945, 74 y.o.   MRN: [id number]  Met with pt and husband who was present in her room to inform team conference goals of min assist level and target discharge of 9/22. Pt asked if at discharge she can do cooking on her own and informed her she will need assistance and the education we will do with husband prior to discharging home. He voiced he can do this. Pt really wants to be higher level before going home and not require so much assist. She wants to be able to go to the bathroom on her own. Made pt aware MD is aware of her back pain and who this limits her in therapies. She will address this with her. Will continue to work on discharge needs.

## 2021-07-16 NOTE — Progress Notes (Signed)
Physical Therapy Session Note  Patient Details  Name: Samantha King MRN: [id number] Date of Birth: [birth year]  Today's Date: 07/16/2021 PT Individual Time: [id number] PT Individual Time Calculation (min): 56 min   Short Term Goals: Week 1:  PT Short Term Goal 1 (Week 1): Pt will perform supine <>sit w/min A PT Short Term Goal 2 (Week 1): Pt will perform sit <>stand w/LRAD and mod A PT Short Term Goal 3 (Week 1): Pt will perform bed <>chair transfers mod A w/LRAD PT Short Term Goal 4 (Week 1): Pt will maintain dynamic sitting balance w/no UE support for 2 minutes w/min A  Skilled Therapeutic Interventions/Progress Updates:  Pt received sitting in WC on 1L O2, trunk lean to R side, reported 6/10 pain in low back. Pt reported not performing well in earlier therapy sessions due to pain and was very discouraged. Provided pt with heat pack for low back and encouragement regarding progress in therapy, pt agreeable to PT and nursing notified to administer pain meds. Pt attempted sit <> stand from [facility] to RW, unable to achieve full trunk extension due to pain. After 5 minutes w/heat pack, pain had reduced and pt able to perform sit <>stand to RW w/min A, multimodal cues for trunk extension and weight shift to L side. Noted pt able to sit more at midline in [facility] w/heat pack on low back. Transported pt to // bars w/total A for time management and pt performed sit <>stands x3 in bars w/CGA and held stand for 30-60s w/BUE support. On 4th stand, pt ambulated 3' forward and 3' backward w/CGA, pt was notably afraid to turn due to pain. Visually demonstrated how to turn in // bars and pt able to perform 45' turn to L side w/BUE support and CGA.  Pt transported back to room w/total A and was left sitting in [facility] w/heat pack on low back, reported pain as 0/10 and all needs in reach.   Therapy Documentation Precautions:  Precautions Precautions: Fall Restrictions Weight Bearing Restrictions: No   Therapy/Group:  Individual Therapy [name], PT, DPT  07/16/2021, 7:44 AM

## 2021-07-16 NOTE — Progress Notes (Signed)
Occupational Therapy Session Note  Patient Details  Name: Samantha King MRN: [id number] Date of Birth: 1946-08-01  Today's Date: 07/16/2021 OT Individual Time: [id number] OT Individual Time Calculation (min): 29 min    Short Term Goals: Week 1:  OT Short Term Goal 1 (Week 1): Pt will tolerate sitting EOB for 10 minutes within functional BADL task OT Short Term Goal 2 (Week 1): Patient will complete BSC transfer with max A +2 OT Short Term Goal 3 (Week 1): Pt will complete 1 step of LB dressing task  Skilled Therapeutic Interventions/Progress Updates:    Pt in wheelchair at start of session with 1L of O2 in place and sats at 91%.  Worked on sit to stand transitions from the wheelchair with min assist using the RW for support.  She was able to maintain standing for 1 min first attempt and only about 20 seconds on the second.  Worked on hand placement pushing up from the wheelchair with one hand and then having the other on the walker as well as pushing up from the wheelchair with both hands.  She felt stronger and more efficient with pushing up from the wheelchair with both hands.  Limited by pain with transitions scooting and standing overall.  X-ray in to take chest film as well.  She was left with the call button and phone in reach with safety alarm belt in place.    Therapy Documentation Precautions:  Precautions Precautions: Fall Restrictions Weight Bearing Restrictions: No  Pain: Pain Assessment Pain Scale: Faces Pain Score: 0-No pain Faces Pain Scale: Hurts little more Pain Type: Acute pain Pain Location: Back Pain Orientation: Left Pain Descriptors / Indicators: Discomfort Pain Onset: With Activity Pain Intervention(s): Repositioned;Emotional support ADL: See Care Tool Section for some details of mobility and selfcare   Therapy/Group: Individual Therapy  Cally Nygard OTR/L 07/16/2021, 12:42 PM

## 2021-07-16 NOTE — Patient Care Conference (Signed)
Inpatient RehabilitationTeam Conference and Plan of Care Update Date: 07/16/2021   Time: 12:06 PM    Patient Name: Samantha King      Medical Record Number: [id number]  Date of Birth: 08-18-1946 Sex: Female         Room/Bed: 4W06C/4W06C-01 Payor Info: Payor: MEDICARE / Plan: MEDICARE PART A AND B / Product Type: *No Product type* /    Admit Date/Time:  07/10/2021  3:44 PM  Primary Diagnosis:  [facility] Problems: Principal Problem:   Debility Active Problems:   Severe muscle deconditioning   MSSA bacteremia   Loose stools   Supplemental oxygen dependent    Expected Discharge Date: Expected Discharge Date: 08/01/21  Team Members Present: Physician leading conference: Dr. Courtney Heys Social Worker Present: Ovidio Kin, LCSW Nurse Present: Dorien Chihuahua, RN PT Present: Francena Hanly, PT OT Present: Meriel Pica, OT PPS Coordinator present : Gunnar Fusi, SLP     Current Status/Progress Goal Weekly Team Focus  Bowel/Bladder   Pt. is continenet B/B.   LBM 07/14/21  Pt. will maintain normal B/B pattern  Toilet pt. PRN on qshift   Swallow/Nutrition/ Hydration             ADL's   max A UB ADLs, total-dependent LB ADLs (can assist some at times based on position), sit<>stand mod A occasionally improving to CGA with practice  UB ADLs set up A; LB ADLs CGA; min A sit<>stand  ADL retraining, AE/DME, functional mobility/transfer training, pain management, endurance, generalized conditioning, safety   Mobility   CGA bed mobility, min A sit <>stand w/stedy  Min A transfers, mod A gait  Transfers, reduced anxiety, pain management, pre-gait training   Communication             Safety/Cognition/ Behavioral Observations            Pain   Pain is well managed with current ordered medications  Pt. will verbalize a pain level less than 3  Assess pain q4 and PRN   Skin   Maceration in bilateral thighs beig treated with ordered cream  Pt, will maintain an intact skin  withn no infection  Assess skin for new breakdown on qshift and PRN     Discharge Planning:  Home with hhusband who can provide assist, will encourage him to begin early with education due to pt's care needs.   Team Discussion: Back pain limiting progress; addressed per MD. Patient reporting deep spasms/Samantha King pain with movement even with medications on board. Continue Ancef thru 08/01/21. MD checking CXR as patient is O2 dependent (new), ESR down and UA negative. Patient also has anxiety and far of moving wrong.  Patient on target to meet rehab goals: yes, currently min assist for sit - stand, mod assist stand pivot and able to ambulate 5' in the parallel bars. Min assist goals set for discharge. Goals for discharge set at min assist.   *See Care Plan and progress notes for long and short-term goals.   Revisions to Treatment Plan:  Practice using adaptive equipment for dressing   Teaching Needs: Medications, skin care, transfers, toileting, etc.  Current Barriers to Discharge: Decreased caregiver support and Home enviroment access/layout  Possible Resolutions to Barriers: Family education     Medical Summary Current Status: still using gerhardts on backside; tylenol and oxy and muscle relaxant; conitnent B/B; leukocytosis; no UTI- ordered pyridium  Barriers to Discharge: Decreased family/caregiver support;Home enviroment access/layout;Medical stability;IV antibiotics;New oxygen;Weight;Wound care  Barriers to Discharge Comments: goals min  A for therapy; going home with husband Possible Resolutions to [employer] Focus: "stoic"- about pain- when doing activity; focus- back pain; add abd binder; mod A ADLs; slow due to pain/limiting; CXR today due to leukocytosis; on IV ABX- anxiety playing a role- d/c date- 9/22   Continued Need for Acute Rehabilitation Level of Care: The patient requires daily medical management by a physician with specialized training in physical medicine and  rehabilitation for the following reasons: Direction of a multidisciplinary physical rehabilitation program to maximize functional independence : Yes Medical management of patient stability for increased activity during participation in an intensive rehabilitation regime.: Yes Analysis of laboratory values and/or radiology reports with any subsequent need for medication adjustment and/or medical intervention. : Yes   I attest that I was present, lead the team conference, and concur with the assessment and plan of the team.   Dorien Chihuahua B 07/16/2021, 4:03 PM

## 2021-07-16 NOTE — Progress Notes (Signed)
Occupational Therapy Session Note  Patient Details  Name: Samantha King MRN: [id number] Date of Birth: 23-Sep-1946  Today's Date: 07/16/2021 OT Individual Time: 3833-3832 OT Individual Time Calculation (min): 60 min    Short Term Goals: Week 1:  OT Short Term Goal 1 (Week 1): Pt will tolerate sitting EOB for 10 minutes within functional BADL task OT Short Term Goal 2 (Week 1): Patient will complete BSC transfer with max A +2 OT Short Term Goal 3 (Week 1): Pt will complete 1 step of LB dressing task  Skilled Therapeutic Interventions/Progress Updates:    Pt received in bed requesting to toilet. She stated she just got her pain and muscle relaxer meds a few minutes before therapy and her back was hurting a great deal.  Therapy session focused on functional mobility, pain management, adaptive strategies for self care.  Pt had extra A to come to sit as she was in more pain.  She then did stand pivots (to wc, to toilet and back = 3 x total) all with mod A.  She rose to stand with min A to RW for clothing management pre and post toileting but continues to need max A with clothing as she is not able to release hands from RW.  Pt having frequent back spasms.  Pt did work on LB bathing from [facility] over toilet and used reacher to get pants over feet with min A.  She was more fatigued and having more difficulty breathing today during the work cycles, she also tends to hold her breath on exertion. Pt on 1L of O2.  She then sat at sink to brush teeth and wash UB and change shirts with min A. Excellent participation despite her pain and frequent back spasms.  Pt resting in   wc  with   belt  alarm off as son in room and next therapist arriving in 15 min and all needs met.   Therapy Documentation Precautions:  Precautions Precautions: Fall Restrictions Weight Bearing Restrictions: No    Vital Signs: Oxygen Therapy SpO2: 97 % O2 Device: Nasal Cannula O2 Flow Rate (L/min): 1 L/min Pain: Pain  Assessment Pain Scale: 0-10 Pain Score: 7  Pain Type: Acute pain Pain Location: Head Pain Descriptors / Indicators: Aching;Discomfort Pain Frequency: Intermittent Pain Intervention(s): Medication (See eMAR) ADL: ADL Eating: Set up Grooming: Minimal assistance Upper Body Bathing: Moderate assistance Lower Body Bathing: Dependent Upper Body Dressing: Maximal assistance Lower Body Dressing: Dependent Toileting: Dependent Toilet Transfer: Unable to assess   Therapy/Group: Individual Therapy  [place] 07/16/2021, 8:21 AM

## 2021-07-17 DIAGNOSIS — R5381 Other malaise: Secondary | ICD-10-CM | POA: Diagnosis not present

## 2021-07-17 DIAGNOSIS — D72829 Elevated white blood cell count, unspecified: Secondary | ICD-10-CM

## 2021-07-17 DIAGNOSIS — R7881 Bacteremia: Secondary | ICD-10-CM | POA: Diagnosis not present

## 2021-07-17 DIAGNOSIS — Z9981 Dependence on supplemental oxygen: Secondary | ICD-10-CM | POA: Diagnosis not present

## 2021-07-17 LAB — CBC WITH DIFFERENTIAL/PLATELET
Abs Immature Granulocytes: 0.07 10*3/uL (ref 0.00–0.07)
Basophils Absolute: 0 10*3/uL (ref 0.0–0.1)
Basophils Relative: 0 %
Eosinophils Absolute: 0.1 10*3/uL (ref 0.0–0.5)
Eosinophils Relative: 1 %
HCT: 33.2 % — ABNORMAL LOW (ref 36.0–46.0)
Hemoglobin: 10.5 g/dL — ABNORMAL LOW (ref 12.0–15.0)
Immature Granulocytes: 1 %
Lymphocytes Relative: 19 %
Lymphs Abs: 2.5 10*3/uL (ref 0.7–4.0)
MCH: 29.2 pg (ref 26.0–34.0)
MCHC: 31.6 g/dL (ref 30.0–36.0)
MCV: 92.5 fL (ref 80.0–100.0)
Monocytes Absolute: 1.2 10*3/uL — ABNORMAL HIGH (ref 0.1–1.0)
Monocytes Relative: 9 %
Neutro Abs: 9.4 10*3/uL — ABNORMAL HIGH (ref 1.7–7.7)
Neutrophils Relative %: 70 %
Platelets: 459 10*3/uL — ABNORMAL HIGH (ref 150–400)
RBC: 3.59 MIL/uL — ABNORMAL LOW (ref 3.87–5.11)
RDW: 18.6 % — ABNORMAL HIGH (ref 11.5–15.5)
WBC: 13.3 10*3/uL — ABNORMAL HIGH (ref 4.0–10.5)
nRBC: 0 % (ref 0.0–0.2)

## 2021-07-17 LAB — GLUCOSE, CAPILLARY
Glucose-Capillary: 79 mg/dL (ref 70–99)
Glucose-Capillary: 114 mg/dL — ABNORMAL HIGH (ref 70–99)
Glucose-Capillary: 140 mg/dL — ABNORMAL HIGH (ref 70–99)
Glucose-Capillary: 137 mg/dL — ABNORMAL HIGH (ref 70–99)

## 2021-07-17 MED ORDER — OXYCODONE HCL 5 MG PO TABS
5.0000 mg | ORAL_TABLET | Freq: Four times a day (QID) | ORAL | Status: DC | PRN
  Administered 2021-07-18 – 2021-07-31 (×31): 5 mg via ORAL
  Filled 2021-07-17 (×34): qty 1

## 2021-07-17 MED ORDER — MUSCLE RUB 10-15 % EX CREA
1.0000 "application " | TOPICAL_CREAM | Freq: Three times a day (TID) | CUTANEOUS | Status: DC
  Administered 2021-07-17 – 2021-07-18 (×2): 1 via TOPICAL
  Filled 2021-07-17: qty 85

## 2021-07-17 NOTE — Progress Notes (Signed)
PROGRESS NOTE   Subjective/Complaints: Patient seen sitting up in her chair this morning.  She states she slept well overnight.  She notes that her supplemental oxygen requirement continues to decrease.  ROS: Denies CP, SOB, N/V/D  Objective:   DG CHEST PORT 1 VIEW  Result Date: 07/16/2021 CLINICAL DATA:  Shortness of breath.  Cough. EXAM: PORTABLE CHEST 1 VIEW COMPARISON:  06/27/2021 FINDINGS: Patient rotated right. Right-sided PICC line terminates at the superior caval/atrial junction. Mild cardiomegaly. Atherosclerosis in the transverse aorta. No pleural effusion or pneumothorax. Diffuse peribronchial thickening. Mild volume loss and subsegmental atelectasis or scarring at the left lung base. IMPRESSION: No acute cardiopulmonary disease. Peribronchial thickening which may relate to chronic bronchitis or smoking. Aortic Atherosclerosis (ICD10-I70.0). Electronically Signed   By: Abigail Miyamoto M.D.   On: 07/16/2021 11:50   Recent Labs    07/16/21 0311 07/17/21 0325  WBC 14.1* 13.3*  HGB 10.3* 10.5*  HCT 33.5* 33.2*  PLT 471* 459*     Recent Labs    07/15/21 0354 07/16/21 0311  NA 135 136  K 4.0 4.1  CL 101 104  CO2 27 26  GLUCOSE 97 100*  BUN 20 25*  CREATININE 0.65 0.62  CALCIUM 9.3 9.2      Intake/Output Summary (Last 24 hours) at 07/17/2021 1010 Last data filed at 07/17/2021 0757 Gross per 24 hour  Intake 800 ml  Output --  Net 800 ml         Physical Exam: Vital Signs Blood pressure 127/77, pulse 73, temperature 97.9 F (36.6 C), temperature source Oral, resp. rate 16, height '5\' 3"'$  (1.6 m), weight 77.3 kg, SpO2 94 %. Constitutional: No distress . Vital signs reviewed. HENT: Normocephalic.  Atraumatic. Eyes: EOMI. No discharge. Cardiovascular: No JVD.  RRR. Respiratory: Normal effort.  No stridor.  Bilateral clear to auscultation. GI: Non-distended.  BS +. Skin: Warm and dry.  Intact. Psych: Normal  mood.  Normal behavior. Musc: No edema in extremities.  No tenderness in extremities. Neuro: Alert and oriented Motor 4/5 UE and 4/5 LE's  Assessment/Plan: 1. Functional deficits which require 3+ hours per day of interdisciplinary therapy in a comprehensive inpatient rehab setting. Physiatrist is providing close team supervision and 24 hour management of active medical problems listed below. Physiatrist and rehab team continue to assess barriers to discharge/monitor patient progress toward functional and medical goals  Care Tool:  Bathing    Body parts bathed by patient: Right arm, Left arm, Chest, Abdomen, Front perineal area, Face, Right upper leg, Left upper leg   Body parts bathed by helper: Right lower leg, Left lower leg, Buttocks, Face     Bathing assist Assist Level: Moderate Assistance - Patient 50 - 74%     Upper Body Dressing/Undressing Upper body dressing   What is the patient wearing?: Pull over shirt    Upper body assist Assist Level: Minimal Assistance - Patient > 75%    Lower Body Dressing/Undressing Lower body dressing      What is the patient wearing?: Incontinence brief, Pants     Lower body assist Assist for lower body dressing: Maximal Assistance - Patient 25 - 49%  Toileting Toileting    Toileting assist Assist for toileting: Maximal Assistance - Patient 25 - 49%     Transfers Chair/bed transfer  Transfers assist  Chair/bed transfer activity did not occur: Safety/medical concerns  Chair/bed transfer assist level: Moderate Assistance - Patient 50 - 74%     Locomotion Ambulation   Ambulation assist   Ambulation activity did not occur: Safety/medical concerns  Assist level: Minimal Assistance - Patient > 75% Assistive device: Walker-rolling (and maxi sky) Max distance: 20'   Walk 10 feet activity   Assist  Walk 10 feet activity did not occur: Safety/medical concerns  Assist level: Minimal Assistance - Patient >  75% Assistive device: Walker-rolling, Maxi Sky   Walk 50 feet activity   Assist Walk 50 feet with 2 turns activity did not occur: Safety/medical concerns         Walk 150 feet activity   Assist Walk 150 feet activity did not occur: Safety/medical concerns         Walk 10 feet on uneven surface  activity   Assist Walk 10 feet on uneven surfaces activity did not occur: Safety/medical concerns         Wheelchair     Assist Is the patient using a wheelchair?: Yes Type of Wheelchair: Manual Wheelchair activity did not occur: Safety/medical concerns         Wheelchair 50 feet with 2 turns activity    Assist    Wheelchair 50 feet with 2 turns activity did not occur: Safety/medical concerns       Wheelchair 150 feet activity     Assist  Wheelchair 150 feet activity did not occur: Safety/medical concerns       Blood pressure 127/77, pulse 73, temperature 97.9 F (36.6 C), temperature source Oral, resp. rate 16, height '5\' 3"'$  (1.6 m), weight 77.3 kg, SpO2 94 %.  Medical Problem List and Plan: 1.  Deficits with mobility, endurance, self-care secondary to debility from osteomyelitis/discitis/septic shock/septic emboli/presumed endocarditis  Cont CIR 2.  Antithrombotics: -DVT/anticoagulation:  Pharmaceutical: Lovenox             -antiplatelet therapy: Plavix 3. Left buttock pain/back pain- : Oxycodone prn             --continue Cymbalta and Gabapentin.              Monitor with increased exertion  increased cymbalta to 60 mg daily  Controlled on 9/7 4. Mood: LCSW to follow for evaluation and support.   Started Buspar 5 mg TID for anxiety on 9/6.              -antipsychotic agents: N/a 5. Neuropsych: This patient is capable of making decisions on her own behalf. 6. Skin/Wound Care: Routine pressure relief measure. Foam dressing to sacrum 7. Fluids/Electrolytes/Nutrition: Intake poor --does not like the food here.             Liberalize diet to  regular for flavor.  --Advised husband to bring supplements/food from home 8. MSSA bacteremia w/sepsis:  Zosyn changed to Ancef through 9/22 9. Diskitis:   Plan for MRI this week             - Appreciate ID recs  10. COPD/Pulmonary septic emboli: continue Ancef-encourage pulmonary toilet with flutter valve             --continue Incurse inhaler daily             Wean supplemental oxygen as tolerated  Continues to require, however  decreasing in 9/7 11. Gout flare: Continue prednisone taper 12.. Hypokalemia:  . Added supplement as Lasix on board. Potassium 4.1 on 9/6 Supplementing 13. Neurogenic bladder v/s overflow: Foley d/c'ed 08/28 and she has not been able to control her bladder             UA negative              --needs a few pvr's, is continent. Toilet patient every 3 hours 14.  Loose stools w/incontinence/Neurogenic bowel?: Goes after she eats as well as prn.             --discontinued Miralax --KUB only with gas -no stools recorded yet -continue daily fiber 15. Obesity BMI 30.20: provide dietary counseling.  16. Leukocytosis  Urine culture suggesting multiple species  WBCs 13.3 on 9/7 and trending down, labs ordered for tomorrow  LOS: 7 days A FACE TO FACE EVALUATION WAS PERFORMED  [place] Hijazi Lorie Phenix 07/17/2021, 10:10 AM

## 2021-07-17 NOTE — Progress Notes (Signed)
Physical Therapy Session Note  Patient Details  Name: Samantha King MRN: [id number] Date of Birth: 1946/06/20  Today's Date: 07/17/2021 PT Individual Time: 804-859     Short Term Goals: Week 1:  PT Short Term Goal 1 (Week 1): Pt will perform supine <>sit w/min A PT Short Term Goal 2 (Week 1): Pt will perform sit <>stand w/LRAD and mod A PT Short Term Goal 3 (Week 1): Pt will perform bed <>chair transfers mod A w/LRAD PT Short Term Goal 4 (Week 1): Pt will maintain dynamic sitting balance w/no UE support for 2 minutes w/min A   Skilled Therapeutic Interventions/Progress Updates:   Pt received sitting EOB and agreeable to PT. PT assisted pt to don shoes with total A* while maintaining balance with supervision assist. Sit<>stand from [facility] with min assist and cues for maintain UE support on RW to limit trunkal rotation through stance and prevent LBP from spasm.   Stand pivot transfer to [facility] with min assist and RW. No instability or pain noted in this transfer. Pt transported to rehab gym in [facility]. And performed stand pivot to [facility] as listed above. Sit<>stand from elevated mat table x 4 with BUE support on RW to prevent trunkal rotation in transfer.   Gait training with RW x 12f with min assist overall for safety. Moderate cues for AD management in turn as well as deep core activation and improved AD management in turn. Mild pain in the back for last 5 ft, but no LOB rated 5/10 and pt repositioned to pain control.   WC mobility x 1544fwith min assist and cues for improved UE symmetry R and L to prevent veer to the R as well as improved doorway management.   [facility] and all needs met.         Therapy Documentation Precautions:  Precautions Precautions: Fall Restrictions Weight Bearing Restrictions: No  Vital Signs: Therapy Vitals Temp: 97.9 F (36.6 C) Temp Source: Oral Pulse Rate: 73 Resp: 16 BP: 127/77 Patient  Position (if appropriate): Lying Oxygen Therapy SpO2: 94 % O2 Device: Nasal Cannula O2 Flow Rate (L/min): 1 L/min Pain: Denies at rest    Therapy/Group: Individual Therapy  AuLorie Phenix/05/2021, 7:59 AM

## 2021-07-17 NOTE — Progress Notes (Signed)
Occupational Therapy Session Note  Patient Details  Name: Samantha King MRN: [id number] Date of Birth: 03/18/1946  Today's Date: 07/17/2021 OT Individual Time: [id number] OT Individual Time Calculation (min): 57 min    Short Term Goals: Week 1:  OT Short Term Goal 1 (Week 1): Pt will tolerate sitting EOB for 10 minutes within functional BADL task OT Short Term Goal 2 (Week 1): Patient will complete BSC transfer with max A +2 OT Short Term Goal 3 (Week 1): Pt will complete 1 step of LB dressing task  Skilled Therapeutic Interventions/Progress Updates:  Pt greeted seated in w/c  agreeable to OT intervention. Session focus on BADL reeducation, DC planning and functional transfer training.Pt reports feeling more stressed and emotional this date, offered emotional support and active listening, education provided on stress mgmt group here on CIR with pt agreeable to attend, will let RT know.  Pt reports need to void bladder, MIN A for stand pivot transfer from w/c>toilet ( 3n1 over regular toilet) with use of grab bars to pivot. Pt required step by step cues to sequence transfer. Pt required MAX A for 3/3 toileting tasks with pt needing assist for all toileting tasks, pt declined attempting to complete anterior pericare d/t fear of falling. Pt transported out of bathroom to sink for grooming tasks including applying make up with set- up assist and curling pts hair from sitting in w/c to facilitate improvement with overall activity tolerance. Pt required MAX A to don clean underwear as her husband arrived with clean clothes. Remainder of session to focus on DC planning with husband showing this OTA pictures of pts shower, pt has walkin shower with 3 inch threshold and built in seat. Provided visual demo of shower transfer to walkin shower but recommend pt attempting shower transfer next session. pt left seated in w/c with safety belt activated and all needs within reach.                        Therapy  Documentation Precautions:  Precautions Precautions: Fall Restrictions Weight Bearing Restrictions: No  Pain:pt reports 5/10 pain in back, Rn to enter to provide pain meds.     Therapy/Group: Individual Therapy  Precious Haws 07/17/2021, 12:10 PM

## 2021-07-17 NOTE — Progress Notes (Signed)
Occupational Therapy Session Note  Patient Details  Name: Samantha King MRN: [id number] Date of Birth: 09-25-1946  Today's Date: 07/17/2021 OT Individual Time: 2876-8115 OT Individual Time Calculation (min): 37 min + 43 min   Short Term Goals: Week 1:  OT Short Term Goal 1 (Week 1): Pt will tolerate sitting EOB for 10 minutes within functional BADL task OT Short Term Goal 2 (Week 1): Patient will complete BSC transfer with max A +2 OT Short Term Goal 3 (Week 1): Pt will complete 1 step of LB dressing task  Skilled Therapeutic Interventions/Progress Updates:    Session 1 ([id number]) : Pt received seated in w/c with husband present, cont to c/o ongoing back pain in small of back, but did not rate, agreeable to therapy. Session focus on standing activity tolerance, func transfers, BUE strengthening in prep for improved ADL/IADL/func mobility performance + decreased caregiver burden. Self-propelled w/c > nurses station with close S and increased time. SatO2 at 84% on RA, switched to 1L via [place] and pt satting at ~94% with activity. Completed 3 sit to stands at high low table with min A to power up and cues for split hand technique each time. Pt able to stand for ~ 30 secs each time while participating in word game for pain distraction. Reports improved back pain, but ongoing BLE soreness. Therapeutic use of self utilized throughout session as pt reports feeling upset about perceived lack of progress and being an "invalid."   Pt left seated in w/c with husband/RT present with safety belt alarm engaged, call bell in reach, and all immediate needs met.    Session 2 [phone]): Pt received seated in w/c, cont to c/o ongoing back pain, agreeable to therapy. Session focus on self-care retraining, activity tolerance, static standing balance, func transfers, BUE strengthening in prep for improved ADL/IADL/func mobility performance + decreased caregiver burden. C/o shoes being too tight, husband will  bring in larger size. Donned B gripper socks total A. Stand-pivot to and from toilet with min A to power up, noted increased trunk flexion and req cues for split hand technique. Total A for LB clothing management, pt able to cleanse anterior periarea post continent void of bladder. Noted incontinence of bladder in underwear, total A to redonn new underwear and pants. Pt completed oral and hand hygiene seated at sink with set-up A. Self-propelled w/c to nurses station with S and increased time. Complete sit<>stand x2 to match playing cards at shoulder/head height with BUE to increase confidence with unilateral UE support during standing ADL. Req seated rest break after ~1.5 min. LPN notified pt interested in receiving "pain relief cream" to BLE.   SatOs at 93% on 1.5 L post activity via [place].    Pt left seated in w/c with safety belt alarm engaged, call bell in reach, and all immediate needs met.    Therapy Documentation Precautions:  Precautions Precautions: Fall Restrictions Weight Bearing Restrictions: No  Pain:  See session notes ADL: See Care Tool for more details.   Therapy/Group: Individual Therapy  Volanda Napoleon MS, OTR/L  07/17/2021, 6:51 AM

## 2021-07-17 NOTE — Progress Notes (Signed)
Nutrition Follow-up  DOCUMENTATION CODES:   Not applicable  INTERVENTION:  Continue Ensure Enlive po TID, each supplement provides 350 kcal and 20 grams of protein.  Encourage adequate PO intake.   NUTRITION DIAGNOSIS:   Increased nutrient needs related to acute illness (Sepsis & Rehab) as evidenced by estimated needs; ongoing  GOAL:   Patient will meet greater than or equal to 90% of their needs; met  MONITOR:   PO intake, Supplement acceptance  REASON FOR ASSESSMENT:   Consult Other (Comment)  ASSESSMENT:   Pt admitted to CIR from [facility] for continued rehab due to sepsis complications. Pt with PMH of COPD, HF, HTN, and GERD. Pt initially admitted to [facility] for back pain and sepsis due to MSSA bacteremia. Deficits with mobility, endurance, self-care secondary to debility from osteomyelitis/discitis/septic shock/septic emboli/presumed endocarditis  Meal completion has been 50-100%. Pt currently has Ensure ordered and has been consuming them. RD to continue with current orders to aid in caloric and protein needs. Family given permission to bring food in from home/outside as pt reports dislike of hospital food.   Labs and medications reviewed.   Diet Order:   Diet Order             Diet regular Room service appropriate? Yes; Fluid consistency: Thin  Diet effective now                   EDUCATION NEEDS:   No education needs have been identified at this time  Skin:  Skin Assessment: Reviewed RN Assessment  Last BM:  9/4  Height:   Ht Readings from Last 1 Encounters:  07/10/21 5' 3"  (1.6 m)    Weight:   Wt Readings from Last 1 Encounters:  07/10/21 77.3 kg    Ideal Body Weight:  52 kg  BMI:  Body mass index is 30.2 kg/m.  Estimated Nutritional Needs:   Kcal:  1700-1900  Protein:  85-100 grams  Fluid:  1700-1900 mL  Corrin Parker, MS, RD, LDN RD pager number/after hours weekend pager number on Amion.

## 2021-07-18 DIAGNOSIS — R5381 Other malaise: Secondary | ICD-10-CM | POA: Diagnosis not present

## 2021-07-18 LAB — CBC WITH DIFFERENTIAL/PLATELET
Abs Immature Granulocytes: 0.2 10*3/uL — ABNORMAL HIGH (ref 0.00–0.07)
Basophils Absolute: 0 10*3/uL (ref 0.0–0.1)
Basophils Relative: 0 %
Eosinophils Absolute: 0.1 10*3/uL (ref 0.0–0.5)
Eosinophils Relative: 1 %
HCT: 34.6 % — ABNORMAL LOW (ref 36.0–46.0)
Hemoglobin: 10.5 g/dL — ABNORMAL LOW (ref 12.0–15.0)
Immature Granulocytes: 1 %
Lymphocytes Relative: 14 %
Lymphs Abs: 2.2 10*3/uL (ref 0.7–4.0)
MCH: 28.3 pg (ref 26.0–34.0)
MCHC: 30.3 g/dL (ref 30.0–36.0)
MCV: 93.3 fL (ref 80.0–100.0)
Monocytes Absolute: 1.4 10*3/uL — ABNORMAL HIGH (ref 0.1–1.0)
Monocytes Relative: 9 %
Neutro Abs: 11.9 10*3/uL — ABNORMAL HIGH (ref 1.7–7.7)
Neutrophils Relative %: 75 %
Platelets: 452 10*3/uL — ABNORMAL HIGH (ref 150–400)
RBC: 3.71 MIL/uL — ABNORMAL LOW (ref 3.87–5.11)
RDW: 18.3 % — ABNORMAL HIGH (ref 11.5–15.5)
WBC: 15.7 10*3/uL — ABNORMAL HIGH (ref 4.0–10.5)
nRBC: 0 % (ref 0.0–0.2)

## 2021-07-18 LAB — GLUCOSE, CAPILLARY
Glucose-Capillary: 91 mg/dL (ref 70–99)
Glucose-Capillary: 112 mg/dL — ABNORMAL HIGH (ref 70–99)
Glucose-Capillary: 129 mg/dL — ABNORMAL HIGH (ref 70–99)
Glucose-Capillary: 126 mg/dL — ABNORMAL HIGH (ref 70–99)

## 2021-07-18 MED ORDER — DICLOFENAC SODIUM 1 % EX GEL
4.0000 g | Freq: Four times a day (QID) | CUTANEOUS | Status: DC
  Administered 2021-07-19 – 2021-08-01 (×48): 4 g via TOPICAL
  Filled 2021-07-18 (×2): qty 100

## 2021-07-18 MED ORDER — METHOCARBAMOL 500 MG PO TABS
1000.0000 mg | ORAL_TABLET | Freq: Four times a day (QID) | ORAL | Status: DC | PRN
  Administered 2021-07-18: 1000 mg via ORAL
  Administered 2021-07-19: 500 mg via ORAL
  Filled 2021-07-18 (×2): qty 2

## 2021-07-18 NOTE — Progress Notes (Signed)
Occupational Therapy Weekly Progress Note  Patient Details  Name: Samantha King MRN: [id number] Date of Birth: [birth year]  Beginning of progress report period: July 11, 2021 End of progress report period: July 18, 2021  Today's Date: 07/18/2021 OT Individual Time: 1045-1200 OT Individual Time Calculation (min): 75 min    Patient has met 3 of 3 short term goals.  Pt has made excellent progress from admission. She is now tolerating mobility much better and can now complete stand pivot transfers to toilet with CGA to min A and ambulate short distances. She is more involved with her self care and learning to manage her pain.  Patient continues to demonstrate the following deficits: muscle weakness and muscle joint tightness, decreased cardiorespiratoy endurance, and decreased standing balance and decreased balance strategies and therefore will continue to benefit from skilled OT intervention to enhance overall performance with BADL and iADL.  Patient progressing toward long term goals..  Continue plan of care.  OT Short Term Goals Week 1:  OT Short Term Goal 1 (Week 1): Pt will tolerate sitting EOB for 10 minutes within functional BADL task OT Short Term Goal 1 - Progress (Week 1): Met OT Short Term Goal 2 (Week 1): Patient will complete BSC transfer with max A +2 OT Short Term Goal 2 - Progress (Week 1): Met OT Short Term Goal 3 (Week 1): Pt will complete 1 step of LB dressing task OT Short Term Goal 3 - Progress (Week 1): Met Week 2:  OT Short Term Goal 1 (Week 2): Pt will don pants with reacher with min A, OT Short Term Goal 2 (Week 2): Pt will sit to stand from toilet with S to RW. OT Short Term Goal 3 (Week 2): Pt will self cleanse post toileting with mod A. OT Short Term Goal 4 (Week 2): Pt will tolerate standing at sink to be able to brush her teeth.  Skilled Therapeutic Interventions/Progress Updates:    Pt received in wc.  O2 removed during session and pt tolerated  well. Pt changed clothing and practiced using reacher to doff socks and sock aid to don new socks with min A.  Focused on sit to stand techniques of forward lean and wt shift. Pt consistently rose to stand 5x this session with CGA and then practiced w/c to arm chair transfers 2x with CGA.   UE strengthening of biceps with 3# wt 10 x2 and light dowel bar for over head stretches to lengthen back. Pt tolerated well.  Deep pressure to relieve trigger points in low back and pt felt they helped well.      Pt resting in wc with spouse present and belt alarm on and all needs met.    Therapy Documentation Precautions:  Precautions Precautions: Fall Restrictions Weight Bearing Restrictions: No    Vital Signs: Therapy Vitals Temp: 97.6 F (36.4 C) Temp Source: Oral Pulse Rate: 78 Resp: 17 BP: 105/79 Patient Position (if appropriate): Sitting Oxygen Therapy SpO2: 94 % O2 Device: Nasal Cannula O2 Flow Rate (L/min): 1 L/min Pain:  Occasional pain in back with a "catching" sensation when she moves ADL: ADL Eating: Set up Grooming: Setup Where Assessed-Grooming: Sitting at sink Upper Body Bathing: Setup Lower Body Bathing: Moderate assistance Upper Body Dressing: Minimal assistance Lower Body Dressing: Maximal assistance Toileting: Maximal assistance Where Assessed-Toileting: Toilet (BSC over toilet) Toilet Transfer: Minimal assistance Toilet Transfer Method: Stand pivot Toilet Transfer Equipment: Bedside commode, Grab bars    Therapy/Group: Individual Therapy  [place] 07/18/2021, 1:10  PM  

## 2021-07-18 NOTE — Progress Notes (Signed)
PROGRESS NOTE   Subjective/Complaints:  Pt reports robaxin helps pain better than anything- wondering if it can be increased. Pain 0/10 at rest- 5-6/10 with any movement- 8-9/10 with therapy.   Also legs hurt- wants some cream for that.    ROS:   Pt denies SOB, abd pain, CP, N/V/C/D, and vision changes   Objective:   No results found. Recent Labs    07/17/21 0325 07/18/21 0354  WBC 13.3* 15.7*  HGB 10.5* 10.5*  HCT 33.2* 34.6*  PLT 459* 452*    Recent Labs    07/16/21 0311  NA 136  K 4.1  CL 104  CO2 26  GLUCOSE 100*  BUN 25*  CREATININE 0.62  CALCIUM 9.2     Intake/Output Summary (Last 24 hours) at 07/18/2021 1226 Last data filed at 07/17/2021 1852 Gross per 24 hour  Intake 420 ml  Output --  Net 420 ml        Physical Exam: Vital Signs Blood pressure 133/86, pulse 72, temperature 97.8 F (36.6 C), resp. rate 16, height '5\' 3"'$  (1.6 m), weight 77.3 kg, SpO2 94 %.  General: awake, alert, appropriate, laying supine- screamed in pain when tries to turn her; NAD HENT: conjugate gaze; oropharynx moist CV: regular rate; no JVD Pulmonary: CTA B/L; no W/R/R- good air movement GI: soft, NT, ND, (+)BS Psychiatric: appropriate Neurological: Ox3  Alert and oriented Motor 4/5 UE and 4/5 LE's  Assessment/Plan: 1. Functional deficits which require 3+ hours per day of interdisciplinary therapy in a comprehensive inpatient rehab setting. Physiatrist is providing close team supervision and 24 hour management of active medical problems listed below. Physiatrist and rehab team continue to assess barriers to discharge/monitor patient progress toward functional and medical goals  Care Tool:  Bathing    Body parts bathed by patient: Right arm, Left arm, Chest, Abdomen, Front perineal area, Face, Right upper leg, Left upper leg   Body parts bathed by helper: Right lower leg, Left lower leg, Buttocks, Face      Bathing assist Assist Level: Moderate Assistance - Patient 50 - 74%     Upper Body Dressing/Undressing Upper body dressing   What is the patient wearing?: Pull over shirt    Upper body assist Assist Level: Minimal Assistance - Patient > 75%    Lower Body Dressing/Undressing Lower body dressing      What is the patient wearing?: Underwear/pull up     Lower body assist Assist for lower body dressing: Maximal Assistance - Patient 25 - 49%     Toileting Toileting    Toileting assist Assist for toileting: Total Assistance - Patient < 25%     Transfers Chair/bed transfer  Transfers assist  Chair/bed transfer activity did not occur: Safety/medical concerns  Chair/bed transfer assist level: Moderate Assistance - Patient 50 - 74%     Locomotion Ambulation   Ambulation assist   Ambulation activity did not occur: Safety/medical concerns  Assist level: Minimal Assistance - Patient > 75% Assistive device: Walker-rolling (and maxi sky) Max distance: 20'   Walk 10 feet activity   Assist  Walk 10 feet activity did not occur: Safety/medical concerns  Assist level: Minimal Assistance -  Patient > 75% Assistive device: Walker-rolling, Maxi Sky   Walk 50 feet activity   Assist Walk 50 feet with 2 turns activity did not occur: Safety/medical concerns         Walk 150 feet activity   Assist Walk 150 feet activity did not occur: Safety/medical concerns         Walk 10 feet on uneven surface  activity   Assist Walk 10 feet on uneven surfaces activity did not occur: Safety/medical concerns         Wheelchair     Assist Is the patient using a wheelchair?: Yes Type of Wheelchair: Manual Wheelchair activity did not occur: Safety/medical concerns         Wheelchair 50 feet with 2 turns activity    Assist    Wheelchair 50 feet with 2 turns activity did not occur: Safety/medical concerns       Wheelchair 150 feet activity     Assist   Wheelchair 150 feet activity did not occur: Safety/medical concerns       Blood pressure 133/86, pulse 72, temperature 97.8 F (36.6 C), resp. rate 16, height '5\' 3"'$  (1.6 m), weight 77.3 kg, SpO2 94 %.  Medical Problem List and Plan: 1.  Deficits with mobility, endurance, self-care secondary to debility from osteomyelitis/discitis/septic shock/septic emboli/presumed endocarditis  Cont CIR 2.  Antithrombotics: -DVT/anticoagulation:  Pharmaceutical: Lovenox             -antiplatelet therapy: Plavix 3. Left buttock pain/back pain- : Oxycodone prn             --continue Cymbalta and Gabapentin.              Monitor with increased exertion  increased cymbalta to 60 mg daily  9/8- pain at rest 0/10- but as soon as moves, spikes- per pt request, will increase Robaxin to 1000 mg q6 hours prn. Will add voltaren gel QID for legs 4. Mood: LCSW to follow for evaluation and support.   Started Buspar 5 mg TID for anxiety on 9/6.              -antipsychotic agents: N/a 5. Neuropsych: This patient is capable of making decisions on her own behalf. 6. Skin/Wound Care: Routine pressure relief measure. Foam dressing to sacrum 7. Fluids/Electrolytes/Nutrition: Intake poor --does not like the food here.             Liberalize diet to regular for flavor.  --Advised husband to bring supplements/food from home 8. MSSA bacteremia w/sepsis:  Zosyn changed to Ancef through 9/22 9. Diskitis:   9/8- will not order MRI after d/w Dr Linus Salmons and only check labs qweek, as long as no fever.              - Appreciate ID recs  10. COPD/Pulmonary septic emboli: continue Ancef-encourage pulmonary toilet with flutter valve             --continue Incurse inhaler daily             Wean supplemental oxygen as tolerated  Continues to require, however decreasing in 9/7 11. Gout flare: Continue prednisone taper 12.. Hypokalemia:  . Added supplement as Lasix on board. Potassium 4.1 on 9/6 Supplementing 13. Neurogenic bladder  v/s overflow: Foley d/c'ed 08/28 and she has not been able to control her bladder             UA negative              --needs a  few pvr's, is continent. Toilet patient every 3 hours 14.  Loose stools w/incontinence/Neurogenic bowel?: Goes after she eats as well as prn.             --discontinued Miralax --KUB only with gas -no stools recorded yet -continue daily fiber 15. Obesity BMI 30.20: provide dietary counseling.  16. Leukocytosis  Urine culture suggesting multiple species  WBCs 13.3 on 9/7 and trending down, labs ordered for tomorrow  9/8- as per above- check labs weekly.   LOS: 8 days A FACE TO FACE EVALUATION WAS PERFORMED  Vidit Boissonneault 07/18/2021, 12:26 PM

## 2021-07-18 NOTE — Progress Notes (Signed)
Physical Therapy Weekly Progress Note  Patient Details  Name: Samantha King MRN: [id number] Date of Birth: 1946-03-05  Beginning of progress report period: July 11, 2021 End of progress report period: July 18, 2021  Today's Date: 07/18/2021 PT Individual Time: 0902-1005; 3295-1884 PT Individual Time Calculation (min): 63 min and 57 min  Patient has met 4 of 4 short term goals. Pt is performing sit <>stands w/CGA-min A from various surfaces and walking up to 15' w/RW and CGA. Pt able to perform stand pivots from bed <> chair w/CGA and RW. Pt is currently limited by low back pain which she describes as a "catch" and decreased cardiovascular endurance. Pt very motivated to participate in therapy and has strong support system.   Patient continues to demonstrate the following deficits muscle weakness and decreased standing balance, decreased postural control, and decreased balance strategies and therefore will continue to benefit from skilled PT intervention to increase functional independence with mobility.  Patient progressing toward long term goals..  Continue plan of care.  PT Short Term Goals Week 1:  PT Short Term Goal 1 (Week 1): Pt will perform supine <>sit w/min A PT Short Term Goal 1 - Progress (Week 1): Met PT Short Term Goal 2 (Week 1): Pt will perform sit <>stand w/LRAD and mod A PT Short Term Goal 2 - Progress (Week 1): Met PT Short Term Goal 3 (Week 1): Pt will perform bed <>chair transfers mod A w/LRAD PT Short Term Goal 3 - Progress (Week 1): Met PT Short Term Goal 4 (Week 1): Pt will maintain dynamic sitting balance w/no UE support for 2 minutes w/min A PT Short Term Goal 4 - Progress (Week 1): Met Week 2:  PT Short Term Goal 1 (Week 2): Pt will ambulate 25' w/LRAD and min A PT Short Term Goal 2 (Week 2): Pt will perform sit <>stands from WC w/LRAD and CGA consistently PT Short Term Goal 3 (Week 2): Pt will participate in balance assessment w/LRAD for improved  activity tolerance  Skilled Therapeutic Interventions/Progress Updates:  Session 1 Pt received supine in bed, denied pain at rest and had received pain meds prior to session. Pt rolled to L & R w/supervision and use of bedrail to don pants w/max A. Supine <>sit EOB w/supervision and use of bedrail. Sit <>stand from EOB to RW w/min A for stabilization of RW, verbal cues for hand placement and trunk support. Pt ambulated 5' w/RW to [facility] w/CGA, heavy use of Ues on walker and kyphotic posture, verbalized 8/10 pain in low back and requested Lidocaine patch from nursing. Nursing notified but unable to assist during session. Pt performed ADLs at sink w/set-up assist and reported urgency to void. Sit <>stand from [facility] to RW w/min A for trunk support and pt ambulated 10' to toilet w/CGA. Stand <>sit w/min A for hand placement and hip guidance, total A to doff pants. Pt voided continently and therapist performed peri care w/total A. Sit <>stand from toilet w/mod A due to pain and trunk support. Stand pivot w/RW and stand <>sit w/CGA to WC. Pt reported 8/10 pain in low back, instructed pt through diaphragmatic breathing and noted pt is very tense during sessions. Educated pt on breathing during movement and using core to brace back rather than tensing body. Pt reports pain as a catch rather than a spasm. Pt was left sitting in WC in room, all needs in reach, nursing notified a second time to apply lidocaine patch to low back.   Session  2  Pt received sitting in WC in room w/2 nurses and EVS present. Pt reported 7/10 pain in low back, was receiving pain medication concurrently. Pt reported urge to void, sit <>stand from [facility] to RW w/CGA and pt ambulated 10' to [facility] over toilet w/CGA. Stand <>sit w/supervision and verbal cues for hand placement. Pt voided continently and performed peri care w/supervision. Sit <> stand from [facility] to RW w/CGA and pt ambulated 10' to RW w/CGA. Pt reported high levels of fatigue and dizziness, stated  she had not eaten much all day. Offered to get pt a meal, pt declined. Emphasized importance of eating in order to optimize performance in therapy, pt verbalized understanding. Sit <>stand from [facility] to RW w/CGA and alt. Standing marches w/BUE support on RW [id number] for improved single leg stability and lateral weight shift. After several minutes of rest, sit <>[facility] and pt ambulated 15' w/CGA. Pt reported high levels of dizziness, stand <>sit w/min A for controlled lower. Pt transported back to room w/total A 2/2 fatigue and provided 5 minutes of manual therapy to pt's low back to identify trigger point and provide myofascial release. Pt reported decrease in pain to 3/10 in low back. [facility], all needs in reach.   Therapy Documentation Precautions:  Precautions Precautions: Fall Restrictions Weight Bearing Restrictions: No   Therapy/Group: Individual Therapy  JCruzita LedererPlaster, PT, DPT 07/18/2021, 7:49 AM

## 2021-07-18 NOTE — Evaluation (Signed)
Recreational Therapy Assessment and Plan  Patient Details  Name: Samantha King MRN: [id number] Date of Birth: Oct 17, 1946 Today's Date: 07/18/2021  Rehab Potential:  Good ELOS:   9/22  Assessment Hospital Problem: Principal Problem:   Debility Active Problems:   Severe muscle deconditioning   MSSA bacteremia     Past Medical History:      Past Medical History:  Diagnosis Date   CAD (coronary artery disease)      a. s/p INF-LAT STEMI => s/p DES-RCA c/b VF arrest and cardiogenic shock   Cardiac arrest ([place]) 10/2012    STEMI with VF arrest x 2    GERD (gastroesophageal reflux disease)     H1N1 influenza 10/2012   Hx of echocardiogram      a. Echo 10/30/12: Moderate LVH, EF 55-60%, normal wall motion, PASP 45   Hyperlipidemia     Hypertension     Myocardial infarction ([facility])     Pneumonia 10/2012    a. STEMI c/b LLL pneumonia in setting of recent H1N1 influenza   PUD (peptic ulcer disease)     Syncope 1996    reported episode while driving in [place] in 1996 with full cardiac workup = negative   Tobacco abuse      Past Surgical History:       Past Surgical History:  Procedure Laterality Date   BREAST EXCISIONAL BIOPSY        left breast ? 1981   CORONARY ANGIOPLASTY WITH STENT PLACEMENT        s/p inferior STEMI c/b VF arrest and cardiogenic shock requiring IABP   ESOPHAGOGASTRODUODENOSCOPY (EGD) WITH PROPOFOL N/A 03/10/2020    Procedure: ESOPHAGOGASTRODUODENOSCOPY (EGD) WITH PROPOFOL;  Surgeon: Lavena Bullion, DO;  Location: [facility];  Service: Gastroenterology;  Laterality: N/A;   HEMOSTASIS CLIP PLACEMENT   03/10/2020    Procedure: HEMOSTASIS CLIP PLACEMENT;  Surgeon: Lavena Bullion, DO;  Location: [facility];  Service: Gastroenterology;;   HEMOSTASIS CONTROL   03/10/2020    Procedure: HEMOSTASIS CONTROL;  Surgeon: Lavena Bullion, DO;  Location: [facility];  Service: Gastroenterology;;  epi   INTRA-AORTIC BALLOON PUMP INSERTION   10/29/2012     Procedure: INTRA-AORTIC BALLOON PUMP INSERTION;  Surgeon: Burnell Blanks, MD;  Location: [facility] CATH LAB;  Service: Cardiovascular;;   LEFT HEART CATHETERIZATION WITH CORONARY ANGIOGRAM N/A 10/29/2012    Procedure: LEFT HEART CATHETERIZATION WITH CORONARY ANGIOGRAM;  Surgeon: Burnell Blanks, MD;  Location: [facility] CATH LAB;  Service: Cardiovascular;  Laterality: N/A;   PERCUTANEOUS CORONARY STENT INTERVENTION (PCI-S)   10/29/2012    Procedure: PERCUTANEOUS CORONARY STENT INTERVENTION (PCI-S);  Surgeon: Burnell Blanks, MD;  Location: [facility] CATH LAB;  Service: Cardiovascular;;   TUBAL LIGATION          Assessment & Plan Clinical Impression: Patient is a 75 y.o. year old female with  history of CAD s/p VF arrest, PUD, tobacco abuse who was originally admitted to [facility] on 06/18/2021 with reports of fevers and chills X one week with severe back/ Left knee pain and leucocytosis with WBCs 24/9 due to sepsis from MSSA bacteremia.  History taken from chart review and patient. CT spine showed L4-L5 anterolisthesis with severe canal stenosis, moderate left foraminal stenosis L3-L4. CTA chest/abdomen negative for dissection and showed multiple pulmonary nodules up to 2 cm, NO PE, left adrenal nodules, 6.2 mm non obstructive left renal stone, diverticulosis and aortic atherosclerosis with centrilobular/paraseptal emphysema. ID consulted for input and expressed concerns of vertebral infection/diskitis.  Patient started on Cefazolin with recommendations of MRI for work up.     Hospital course significant for decompensation from septic shock on 06/20/2021 with hypoxia, marked hypotension and A fib with RVR. Dr. Cathie Olden consulted for input and she was started on IV heparin, pressors, Cardizem as well as amiodarone for rate control. 2D Echo showed  EF 60-65% with moderate asymmetric LVH and moderate TVR.  Respiratory failure felt to be due to acute diastolic CHF in setting of A fib and respiratory status improve with  high flow oxygen and nebs. PNA/pulmonary effusions felt to be due to pulmonary septic emboli with presumed endocarditis  (due to numerous pulmonary opacities) and diuresis limited by AKI.    She did have rise in WBC to 33.5 and repeat BC X 2 are negative. Follow up MRI spine on 08/12 showed severe multifactorial spinal stenosis L4/5 and moderate stenosis L3/4. MRI abdomen done showing benign left adrenal adenomas, LLL PNA. She continued to have severe back pain out of proportion due to chronic changes but developed extreme sedation with low dose fentanyl. Dr. Baxter Flattery recommended IV Cefazolin  2 gram every 8 hours for 6 weeks with end-date 08/01/21. Pain control was improving with  titration of meds and Ketorolac but she continued to have hypoxia requiring 40-50% HFNC. Therapy was ongoing and patient was limited to bed mobility with inability to lift RUE due to pain, trace movements of BLE and inability to tolerate ROM of any extremities.    She was transferred to [facility] on 08/16 for management and therapy. Respiratory status has improved and she has been weaned down to 4L supplemental oxygen at rest. She started having rise in WBC-19.6 and sed rate-106  and ID consulted for  input. MRI T/L spine done 08/24 revealing interval development of marrow edema bilateral disc space L3/4 suspicious for discitis and osteomyelitis as well as 10-15 ml fluid collection within left psoas suspicious for abscess. IR consulted for aspiration/biopsy but felt that fluid not amenable to aspiration. Antibiotic changed to Zosyn on 08/25 by Dr. Barth Kirks. She developed bilateral foot pain with erythema due to gout flare on 08/28 and was started on prednisone 30  mg X 5 days as well as colcrys with improvement in symptoms. Cognition is back to baseline per husband and she is showing improvement in activity tolerance and participation but continues to be deconditioned. CIR recommended due to functional decline. Please see preadmission  assessment from earlier today as well.   Pt presents with decreased activity tolerance, decreased functional mobility, decreased balance, feelings of stress due to medical issues Limiting pt's independence with leisure/community pursuits.  Plan  Min 1 TR session >20 minutes during LOS  Recommendations for other services: None   Discharge Criteria: Patient will be discharged from TR if patient refuses treatment 3 consecutive times without medical reason.  If treatment goals not met, if there is a change in medical status, if patient makes no progress towards goals or if patient is discharged from hospital.  The above assessment, treatment plan, treatment alternatives and goals were discussed and mutually agreed upon: by patient  [place] 07/18/2021, 3:51 PM

## 2021-07-19 DIAGNOSIS — R5381 Other malaise: Secondary | ICD-10-CM | POA: Diagnosis not present

## 2021-07-19 LAB — GLUCOSE, CAPILLARY
Glucose-Capillary: 94 mg/dL (ref 70–99)
Glucose-Capillary: 93 mg/dL (ref 70–99)
Glucose-Capillary: 144 mg/dL — ABNORMAL HIGH (ref 70–99)
Glucose-Capillary: 129 mg/dL — ABNORMAL HIGH (ref 70–99)

## 2021-07-19 MED ORDER — CHLORHEXIDINE GLUCONATE CLOTH 2 % EX PADS
6.0000 | MEDICATED_PAD | Freq: Every day | CUTANEOUS | Status: DC
  Administered 2021-07-19 – 2021-07-31 (×12): 6 via TOPICAL

## 2021-07-19 MED ORDER — IPRATROPIUM-ALBUTEROL 0.5-2.5 (3) MG/3ML IN SOLN
3.0000 mL | RESPIRATORY_TRACT | Status: DC | PRN
  Administered 2021-07-19 – 2021-07-29 (×2): 3 mL via RESPIRATORY_TRACT
  Filled 2021-07-19 (×2): qty 3

## 2021-07-19 MED ORDER — FLUTICASONE PROPIONATE 50 MCG/ACT NA SUSP
1.0000 | Freq: Every day | NASAL | Status: DC
  Administered 2021-07-19 – 2021-08-01 (×14): 1 via NASAL
  Filled 2021-07-19: qty 16

## 2021-07-19 MED ORDER — METHOCARBAMOL 750 MG PO TABS
750.0000 mg | ORAL_TABLET | Freq: Four times a day (QID) | ORAL | Status: DC | PRN
  Administered 2021-07-19 – 2021-08-01 (×29): 750 mg via ORAL
  Filled 2021-07-19 (×30): qty 1

## 2021-07-19 MED ORDER — LORATADINE 10 MG PO TABS
10.0000 mg | ORAL_TABLET | Freq: Every day | ORAL | Status: DC
  Administered 2021-07-19 – 2021-08-01 (×14): 10 mg via ORAL
  Filled 2021-07-19 (×14): qty 1

## 2021-07-19 MED ORDER — ASCORBIC ACID 500 MG PO TABS: 500.0000 mg | ORAL_TABLET | Freq: Two times a day (BID) | ORAL | Status: DC

## 2021-07-19 MED ORDER — PSEUDOEPHEDRINE HCL ER 120 MG PO TB12: 120.0000 mg | ORAL_TABLET | Freq: Two times a day (BID) | ORAL | Status: DC

## 2021-07-19 MED ORDER — LORATADINE 10 MG PO TABS: 10.0000 mg | ORAL_TABLET | Freq: Every day | ORAL | Status: DC

## 2021-07-19 NOTE — Progress Notes (Signed)
Physical Therapy Session Note  Patient Details  Name: Samantha King MRN: [id number] Date of Birth: 21-Feb-1946  Today's Date: 07/19/2021 PT Individual Time: 0801-0918 PT Individual Time Calculation (min): 77 min   Short Term Goals: Week 1:  PT Short Term Goal 1 (Week 1): Pt will perform supine <>sit w/min A PT Short Term Goal 1 - Progress (Week 1): Met PT Short Term Goal 2 (Week 1): Pt will perform sit <>stand w/LRAD and mod A PT Short Term Goal 2 - Progress (Week 1): Met PT Short Term Goal 3 (Week 1): Pt will perform bed <>chair transfers mod A w/LRAD PT Short Term Goal 3 - Progress (Week 1): Met PT Short Term Goal 4 (Week 1): Pt will maintain dynamic sitting balance w/no UE support for 2 minutes w/min A PT Short Term Goal 4 - Progress (Week 1): Met Week 2:  PT Short Term Goal 1 (Week 2): Pt will ambulate 25' w/LRAD and min A PT Short Term Goal 2 (Week 2): Pt will perform sit <>stands from WC w/LRAD and CGA consistently PT Short Term Goal 3 (Week 2): Pt will participate in balance assessment w/LRAD for improved activity tolerance  Skilled Therapeutic Interventions/Progress Updates:  Pt received sidelying on R side in bed, on 1L O2, denied pain and had received pain meds prior to session. Supine <>sit w/supervision and heavy use of bedrail. SpO2 at 91%, removed O2 and performed sit <>stand from low EOB to RW w/min A for trunk support, tactile cues for trunk extension and verbal cues for pursed lip breathing. Pt ambulated 15' from bed to [facility] in bathroom w/CGA and RW, significant kyphotic posture, verbal cues to maintain safe distance to RW. Noted narrow BOS, decreased step clearance and length bilaterally. Stand <>sit on [facility] w/supervision and pt continent of bladder, incontinent of bowel. Performed lower body dressing w/max A while sitting on BSC. Sit <>stand from [facility] to RW w/CGA and therapist performed peri care w/max A. Pt ambulated 10' from [facility] to [facility] w/CGA and RW. Pt very fatigued and out  of breath, verbal cues for diaphragmatic breathing and donned nasal cannula O2 at 1L, SpO2 at 87%.  Pt reported she no longer wants to take muscle relaxants due to side effect of fatigue, communicated w/Dr. Dagoberto Ligas and nursing. At sink, pt performed ADLs while sitting in WC. Pt self-propelled 75' in [facility] from room to day room w/supervision, SpO2 at 92%. Pt reported she did not want O2, removed O2 and pt ambulated 45' w/RW and CGA, heavy verbal cues for pursed lip breathing. PA present and pt began to sound raspy, encouraged pt to sit w/CGA. SpO2 at 85% and HR at 123 bpm. Pt transported back to room w/total A and O2 increased to 2L. After few minutes, SpO2 at 92% and HR at 83 bpm. Pt was left sitting in WC in room w/PA, all needs in reach.   Therapy Documentation Precautions:  Precautions Precautions: Fall Restrictions Weight Bearing Restrictions: No    Therapy/Group: Individual Therapy Cruzita Lederer Vincenza Dail, PT, DPT  07/19/2021, 7:39 AM

## 2021-07-19 NOTE — Progress Notes (Signed)
Caught up with patient enroute to room with PT. Patient was wheezing with pursed lip breathing, flushed and when made to rest reported feeling weak. Pulse ox revealed O2 84% with HR 120. She reported pain being a level "1" and was wheeled back to room and placed on 2 L oxygen with recover to 92%/ HR 80's.  Lungs with decreased BS--> duonebs ordered. Requested frequent spot check of oxygen with therapy with supplemental use prn. Also discussed pain regimen--> she is glad that robaxin is working but feels that 1000 mg was "dangerous" due to sedative SE. She is willing to give 750 mg a try.

## 2021-07-19 NOTE — Progress Notes (Addendum)
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  Still on 1L O2- will speak with therapy to try and wean.   ROS:   Pt denies SOB, abd pain, CP, N/V/C/D, and vision changes   Objective:   No results found. Recent Labs    07/17/21 0325 07/18/21 0354  WBC 13.3* 15.7*  HGB 10.5* 10.5*  HCT 33.2* 34.6*  PLT 459* 452*    No results for input(s): NA, K, CL, CO2, GLUCOSE, BUN, CREATININE, CALCIUM in the last 72 hours.    Intake/Output Summary (Last 24 hours) at 07/19/2021 0826 Last data filed at 07/18/2021 1836 Gross per 24 hour  Intake 360 ml  Output --  Net 360 ml        Physical Exam: Vital Signs Blood pressure 116/70, pulse 79, temperature 98.5 F (36.9 C), temperature source Oral, resp. rate 20, height '5\' 3"'$  (1.6 m), weight 77.3 kg, SpO2 96 %.   General: awake, alert, appropriate, laying supine in bed; NAD HENT: conjugate gaze; oropharynx moist; O2 1L by [place] CV: regular rate; no JVD Pulmonary: CTA B/L; no W/R/R- good air movement GI: soft, NT, ND, (+)BS Psychiatric: appropriate; interactive Neurological: Ox3- talkative  Alert and oriented Motor 4/5 UE and 4/5 LE's  Assessment/Plan: 1. Functional deficits which require 3+ hours per day of interdisciplinary therapy in a comprehensive inpatient rehab setting. Physiatrist is providing close team supervision and 24 hour management of active medical problems listed below. Physiatrist and rehab team continue to assess barriers to discharge/monitor patient progress toward functional and medical goals  Care Tool:  Bathing    Body parts bathed by patient: Right arm, Left arm, Chest, Abdomen, Front perineal area, Face, Right upper leg, Left upper leg   Body parts bathed by helper: Right lower leg, Left lower leg, Buttocks, Face      Bathing assist Assist Level: Moderate Assistance - Patient 50 - 74%     Upper Body Dressing/Undressing Upper body dressing   What is the patient wearing?: Pull over shirt    Upper body assist Assist Level: Minimal Assistance - Patient > 75%    Lower Body Dressing/Undressing Lower body dressing      What is the patient wearing?: Underwear/pull up     Lower body assist Assist for lower body dressing: Maximal Assistance - Patient 25 - 49%     Toileting Toileting    Toileting assist Assist for toileting: Total Assistance - Patient < 25%     Transfers Chair/bed transfer  Transfers assist  Chair/bed transfer activity did not occur: Safety/medical concerns  Chair/bed transfer assist level: Contact Guard/Touching assist (RW)     Locomotion Ambulation   Ambulation assist   Ambulation activity did not occur: Safety/medical concerns  Assist level: Contact Guard/Touching assist Assistive device: Walker-rolling Max distance: 15'   Walk 10 feet activity   Assist  Walk 10 feet activity did not occur: Safety/medical concerns  Assist level: Contact Guard/Touching assist Assistive device: Walker-rolling   Walk 50 feet activity   Assist Walk 50 feet with 2 turns activity  did not occur: Safety/medical concerns         Walk 150 feet activity   Assist Walk 150 feet activity did not occur: Safety/medical concerns         Walk 10 feet on uneven surface  activity   Assist Walk 10 feet on uneven surfaces activity did not occur: Safety/medical concerns         Wheelchair     Assist Is the patient using a wheelchair?: Yes Type of Wheelchair: Manual Wheelchair activity did not occur: Safety/medical concerns         Wheelchair 50 feet with 2 turns activity    Assist    Wheelchair 50 feet with 2 turns activity did not occur: Safety/medical concerns       Wheelchair 150 feet activity     Assist  Wheelchair 150 feet activity did not  occur: Safety/medical concerns       Blood pressure 116/70, pulse 79, temperature 98.5 F (36.9 C), temperature source Oral, resp. rate 20, height '5\' 3"'$  (1.6 m), weight 77.3 kg, SpO2 96 %.  Medical Problem List and Plan: 1.  Deficits with mobility, endurance, self-care secondary to debility from osteomyelitis/discitis/septic shock/septic emboli/presumed endocarditis  Con't PT and OT- CIR- we specifically discussed that her fear of falling is significantly impacting/limiting therapy and needs to work on this.  2.  Antithrombotics: -DVT/anticoagulation:  Pharmaceutical: Lovenox             -antiplatelet therapy: Plavix 3. Left buttock pain/back pain- : Oxycodone prn             --continue Cymbalta and Gabapentin.              Monitor with increased exertion  increased cymbalta to 60 mg daily  9/8- pain at rest 0/10- but as soon as moves, spikes- per pt request, will increase Robaxin to 1000 mg q6 hours prn. Will add voltaren gel QID for legs  9/9- pain much better with voltaren gel last night- will con't and d/c muscle rub. Changed Robaxin to 750 mg q6 since made her groggy. 4. Mood: LCSW to follow for evaluation and support.   Started Buspar 5 mg TID for anxiety on 9/6.   9/9- pt hasn't really noticed a difference - will speak with therapy.              -antipsychotic agents: N/a 5. Neuropsych: This patient is capable of making decisions on her own behalf. 6. Skin/Wound Care: Routine pressure relief measure. Foam dressing to sacrum 7. Fluids/Electrolytes/Nutrition: Intake poor --does not like the food here.             Liberalize diet to regular for flavor.  --Advised husband to bring supplements/food from home 8. MSSA bacteremia w/sepsis:  Zosyn changed to Ancef through 9/22 9. Diskitis:   9/8- will not order MRI after d/w Dr Linus Salmons and only check labs qweek, as long as no fever.              - Appreciate ID recs  10. COPD/Pulmonary septic emboli: continue Ancef-encourage pulmonary  toilet with flutter valve             --continue Incurse inhaler daily             Wean supplemental oxygen as tolerated  Continues to require, however decreasing in 9/7  9/9-  11. Gout flare: Continue prednisone taper 12.. Hypokalemia:  . Added supplement as Lasix on board. Potassium 4.1 on 9/6 Supplementing 13. Neurogenic bladder  v/s overflow: Foley d/c'ed 08/28 and she has not been able to control her bladder             UA negative              --needs a few pvr's, is continent. Toilet patient every 3 hours  9/9- can move to q2 hours while awake if needed- but pt says doing better 14.  Loose stools w/incontinence/Neurogenic bowel?: Goes after she eats as well as prn.             --discontinued Miralax --KUB only with gas -no stools recorded yet -continue daily fiber 15. Obesity BMI 30.20: provide dietary counseling.  16. Leukocytosis  Urine culture suggesting multiple species  WBCs 13.3 on 9/7 and trending down, labs ordered for tomorrow  9/8- as per above- check labs weekly.   LOS: 9 days A FACE TO FACE EVALUATION WAS PERFORMED  Deaun Rocha 07/19/2021, 8:26 AM

## 2021-07-19 NOTE — Progress Notes (Signed)
Occupational Therapy Session Note  Patient Details  Name: Samantha King MRN: [id number] Date of Birth: [birth year]  Today's Date: 07/19/2021 OT Individual Time: [id number] Individual Time Calculation (min): 77 min    Short Term Goals: Week 2:  OT Short Term Goal 1 (Week 2): Pt will don pants with reacher with min A, OT Short Term Goal 2 (Week 2): Pt will sit to stand from toilet with S to RW. OT Short Term Goal 3 (Week 2): Pt will self cleanse post toileting with mod A. OT Short Term Goal 4 (Week 2): Pt will tolerate standing at sink to be able to brush her teeth.  Skilled Therapeutic Interventions/Progress Updates:  Pt greeted seated in w/c agreeable to OT intervention. Pt kept on 2L [place] during session with SpO2 >93% during session. Session focus on ADL transfers and IADL tasks in kitchen. Pt completed ambulatory toilet transfer with Rw and CGA, pt completed 3/3 toileting tasks with CGA. Pt requests step by step cues when sequencing all mobility tasks frequently stating"okay tell how to do this." Pt transported to ADL apartment to practice walkin shower transfer. Pt completed ambulatory shower transfer with RW with pt stepping in backwards into shower with Rw then reaching back to shower seat. Pt has built in shower seat at home ~ 17 inches tall. Pt additionally completed dynamic reaching tasks in kitchen with Rw and CGA. Pt reports loving to cook, reviewed energy conservation strategies related to kitchen tasks as well as safety tips for mobilizing in kitchen with Rw with pt verbalizing understanding. Pt completed functional mobility greater than a household distance with Rw and CGA. Pt transported remainder of distance back to room where pt left seated in w/c, with safety belt activated and all needs within reach.                              Therapy Documentation Precautions:  Precautions Precautions: Fall Restrictions Weight Bearing Restrictions: No  Pain: pt reports mild pain in  back related to spasms, offered rest breaks and distraction as pain mgmt strategies.     Therapy/Group: Individual Therapy  Precious Haws 07/19/2021, 3:51 PM

## 2021-07-19 NOTE — Progress Notes (Signed)
Occupational Therapy Session Note  Patient Details  Name: Samantha King MRN: [id number] Date of Birth: 01-17-1946  Today's Date: 07/19/2021 OT Individual Time: 1115-1200 OT Individual Time Calculation (min): 45 min    Short Term Goals: Week 2:  OT Short Term Goal 1 (Week 2): Pt will don pants with reacher with min A, OT Short Term Goal 2 (Week 2): Pt will sit to stand from toilet with S to RW. OT Short Term Goal 3 (Week 2): Pt will self cleanse post toileting with mod A. OT Short Term Goal 4 (Week 2): Pt will tolerate standing at sink to be able to brush her teeth.  Skilled Therapeutic Interventions/Progress Updates:    Pt did extremely well today!  Practiced ambulating in and out of bathroom 2x with RW with CGA. Practiced one transfer with BSC over toilet and a 2nd time without BSC and pt able to rise to stand with CGA with cues for strategy. The biggest improvement was her ability to stand with no UE support at all to lower and lift pants over hips.  This was dramatic progress for her.  Pt resting in wc with all needs met. Alarm on. Pt had used 2L of O2 in session with sats at 93%  Therapy Documentation Precautions:  Precautions Precautions: Fall Restrictions Weight Bearing Restrictions: No  Vital Signs: Therapy Vitals Temp: 98.5 F (36.9 C) Temp Source: Oral Pulse Rate: 79 Resp: 20 BP: 116/70 Patient Position (if appropriate): Lying Oxygen Therapy SpO2: 96 % O2 Device: Nasal Cannula O2 Flow Rate (L/min): 1 L/min Pain: Pain Assessment Pain Scale: 0-10 Pain Score: 7  Pain Type: Acute pain Pain Location: Foot Pain Orientation: Right Pain Intervention(s): Medication (See eMAR)    Therapy/Group: Individual Therapy  [place] 07/19/2021, 8:30 AM

## 2021-07-19 NOTE — Progress Notes (Signed)
[facility] for Infectious Disease   Reason for visit: Follow up on lumbar discitis and osteomyelitis  Interval History: feels well, no new issues with worsening back pain or other concerns.  No associated rash or diarrhea.   Total antibiotics day 32  Physical Exam: Constitutional:  Vitals:   07/18/21 2016 07/19/21 0542  BP: 102/82 116/70  Pulse: 87 79  Resp: 18 20  Temp: 97.7 F (36.5 C) 98.5 F (36.9 C)  SpO2: (!) 74% 96%   patient appears in NAD, up in wheelchair HENT: + nasal cannula Respiratory: Normal respiratory effort on nasal cannula Cardiovascular: RRR  Review of Systems: Constitutional: negative for fevers and chills Gastrointestinal: negative for nausea and diarrhea  Lab Results  Component Value Date   WBC 15.7 (H) 07/18/2021   HGB 10.5 (L) 07/18/2021   HCT 34.6 (L) 07/18/2021   MCV 93.3 07/18/2021   PLT 452 (H) 07/18/2021    Lab Results  Component Value Date   CREATININE 0.62 07/16/2021   BUN 25 (H) 07/16/2021   NA 136 07/16/2021   K 4.1 07/16/2021   CL 104 07/16/2021   CO2 26 07/16/2021    Lab Results  Component Value Date   ALT 19 07/15/2021   AST 35 07/15/2021   ALKPHOS 108 07/15/2021     Microbiology: Recent Results (from the past 240 hour(s))  Urine Culture     Status: Abnormal   Collection Time: 07/15/21  3:03 PM   Specimen: Urine, Clean Catch  Result Value Ref Range Status   Specimen Description URINE, CLEAN CATCH  Final   Special Requests   Final    NONE Performed at [facility], [address], [place], [place] [address]    Culture MULTIPLE SPECIES PRESENT, SUGGEST RECOLLECTION (A)  Final   Report Status 07/16/2021 FINAL  Final    Impression/Plan:  1. Discitis, osteomyelitis of lumbar spine - on cefazolin for known MSSA positive blood cultures associated with her back infection.  Plan for 6 weeks of antibiotics from negative blood cultures through 08/01/21.  No indication for a repeat MRI unless new, significant  clinical concerns develop.  Can check a weekly ESR and CRP  2.  Leukocytosis - mild elevation up  to 15.7.  she has been on prednisone.  No new clinical concerns.   3.  Medication monitoring - she should have weekly CBC to monitor for leukopenia or thrombocytopenia on cefazolin.  Weekly CMP to monitor LFTs and creat.    4.  Septic pulmonary emboli - in the setting of MSSA bacteremia.  Likely TV endocarditis and on treatment as above.

## 2021-07-19 NOTE — Discharge Instructions (Addendum)
Inpatient Rehab Discharge Instructions  KIRSTIN HOUSEN Discharge date and time:  08/01/21  Activities/Precautions/ Functional Status: Activity: no lifting, driving, or strenuous exercise till cleared by MD Diet: regular diet Wound Care: keep wound clean and dry   Functional status:  ___ No restrictions     ___ Walk up steps independently _X__ 24/7 supervision/assistance   ___ Walk up steps with assistance ___ Intermittent supervision/assistance  ___ Bathe/dress independently ___ Walk with walker     _X__ Bathe/dress with assistance ___ Walk Independently    ___ Shower independently ___ Walk with assistance    ___ Shower with assistance _X__ No alcohol     ___ Return to work/school ________   Special Instructions:     COMMUNITY REFERRALS UPON DISCHARGE:    HOME HEALTH: PT & OT             Agency: [facility] Phone: [phone]               Medical Equipment/Items Ordered: PATIENT HAS ORDERED VIA [employer]-ROLLATOR, TUB SEAT AND TOILET FRAME ALONG WITH GRAB BARS                                                 Agency/Supplier:NA    My questions have been answered and I understand these instructions. I will adhere to these goals and the provided educational materials after my discharge from the hospital.  Patient/Caregiver Signature _______________________________ Date __________  Clinician Signature _______________________________________ Date __________  Please bring this form and your medication list with you to all your follow-up doctor's appointments.

## 2021-07-20 DIAGNOSIS — R29898 Other symptoms and signs involving the musculoskeletal system: Secondary | ICD-10-CM | POA: Diagnosis not present

## 2021-07-20 DIAGNOSIS — R5381 Other malaise: Secondary | ICD-10-CM | POA: Diagnosis not present

## 2021-07-20 DIAGNOSIS — B9561 Methicillin susceptible Staphylococcus aureus infection as the cause of diseases classified elsewhere: Secondary | ICD-10-CM | POA: Diagnosis not present

## 2021-07-20 DIAGNOSIS — R7881 Bacteremia: Secondary | ICD-10-CM | POA: Diagnosis not present

## 2021-07-20 LAB — GLUCOSE, CAPILLARY
Glucose-Capillary: 98 mg/dL (ref 70–99)
Glucose-Capillary: 103 mg/dL — ABNORMAL HIGH (ref 70–99)
Glucose-Capillary: 150 mg/dL — ABNORMAL HIGH (ref 70–99)
Glucose-Capillary: 76 mg/dL (ref 70–99)

## 2021-07-20 NOTE — Progress Notes (Signed)
Occupational Therapy Session Note  Patient Details  Name: Samantha King MRN: [id number] Date of Birth: 01/11/1946  Today's Date: 07/20/2021 OT Individual Time: 0900-1009 OT Individual Time Calculation (min): 69 min    Short Term Goals: Week 1:  OT Short Term Goal 1 (Week 1): Pt will tolerate sitting EOB for 10 minutes within functional BADL task OT Short Term Goal 1 - Progress (Week 1): Met OT Short Term Goal 2 (Week 1): Patient will complete BSC transfer with max A +2 OT Short Term Goal 2 - Progress (Week 1): Met OT Short Term Goal 3 (Week 1): Pt will complete 1 step of LB dressing task OT Short Term Goal 3 - Progress (Week 1): Met  Skilled Therapeutic Interventions/Progress Updates:     Pt received in w/c agreeable to OT. Intermittent back spasms inducing pain but rest brovided.  ADL:  Pt completes footwear with total A for time Pt completes toileting with demo of grab bar use and sequence of transfer/clothing management. MIN A for SPT and MOD A for toileting to asisst with pants up past hips. Pt completes grooming at sink with set up and hair care at sink-curing hair seated for BUE endruance/shoulder strengthening (attempted standing but deemed unsafe even with hips stabilized on sink). Skilled monitoring of o2 provided which was [facility] during session on 1L o2  Therapeutic activity Education on energy conservation in ADL apartment with significant education on adaptive options in the kitchen from seated and standing level. Edu re devices such as walker tray, oven rack puller, mirror for seated stove top cooking, walker bag, sliding items across counter or gathering items in large bowl in lap to decrease trips if seated in w/c. Relayed ed to husband as well pt actively engaged in discussion offering other suggestions and adaptations.   Pt left at end of session in bed with exit alarm on, call light in reach and all needs met    Therapy/Group: Individual Therapy  Tonny Branch 07/20/2021, 6:51 AM

## 2021-07-20 NOTE — Progress Notes (Signed)
Physical Therapy Session Note  Patient Details  Name: Samantha King MRN: [id number] Date of Birth: 1946/03/06  Today's Date: 07/20/2021 PT Individual Time: 9355-2174 PT Individual Time Calculation (min): 27 min   Short Term Goals: Week 1:  PT Short Term Goal 1 (Week 1): Pt will perform supine <>sit w/min A PT Short Term Goal 1 - Progress (Week 1): Met PT Short Term Goal 2 (Week 1): Pt will perform sit <>stand w/LRAD and mod A PT Short Term Goal 2 - Progress (Week 1): Met PT Short Term Goal 3 (Week 1): Pt will perform bed <>chair transfers mod A w/LRAD PT Short Term Goal 3 - Progress (Week 1): Met PT Short Term Goal 4 (Week 1): Pt will maintain dynamic sitting balance w/no UE support for 2 minutes w/min A PT Short Term Goal 4 - Progress (Week 1): Met Week 2:  PT Short Term Goal 1 (Week 2): Pt will ambulate 25' w/LRAD and min A PT Short Term Goal 2 (Week 2): Pt will perform sit <>stands from WC w/LRAD and CGA consistently PT Short Term Goal 3 (Week 2): Pt will participate in balance assessment w/LRAD for improved activity tolerance  Skilled Therapeutic Interventions/Progress Updates:  Pt received sitting in WC in room, denied pain at rest and was agreeable to PT. Pt on 1L O2, transferred to 2L portable O2 during activity. Sit <>stand from [facility] to RW w/CGA and pt ambulated 4' w/RW and supervision. Noted improved cadence, step clearance B, step length B, and trunk extension. Talked pt through pursed lip breathing as she walked to limit valsalva. Stand <>sit w/supervision and SpO2 at 98%, HR at 131bpm. After several minutes of seated rest, pt performed sit <>stand w/CGA and ambulated 75' back to room w/supervision and RW. Pt denied pain while walking and was very satisfied w/performance. Pt's SpO2 at 96% and HR at 135bpm in room. Reconnected pt's O2 to wall at 1L, encouraged continued pursed lip breathing. Pt was left sitting in WC in room w/all needs in reach.    Therapy  Documentation Precautions:  Precautions Precautions: Fall Restrictions Weight Bearing Restrictions: No   Therapy/Group: Individual Therapy Cruzita Lederer Shaneece Stockburger, PT, DPT  07/20/2021, 7:49 AM

## 2021-07-20 NOTE — Progress Notes (Addendum)
PROGRESS NOTE   Subjective/Complaints:  Having intermittent back and leg pain. Working through it. Appreciates work of all the staff  ROS: Patient denies fever, rash, sore throat, blurred vision, nausea, vomiting, diarrhea, cough, shortness of breath or chest pain, headache, or mood change.    Objective:   No results found. Recent Labs    07/18/21 0354  WBC 15.7*  HGB 10.5*  HCT 34.6*  PLT 452*    No results for input(s): NA, K, CL, CO2, GLUCOSE, BUN, CREATININE, CALCIUM in the last 72 hours.    Intake/Output Summary (Last 24 hours) at 07/20/2021 0950 Last data filed at 07/20/2021 0800 Gross per 24 hour  Intake 230 ml  Output --  Net 230 ml        Physical Exam: Vital Signs Blood pressure 133/87, pulse 77, temperature 98.2 F (36.8 C), temperature source Oral, resp. rate 18, height '5\' 3"'$  (1.6 m), weight 77.3 kg, SpO2 97 %.   Constitutional: No distress . Vital signs reviewed. HEENT: NCAT, EOMI, oral membranes moist Neck: supple Cardiovascular: RRR without murmur. No JVD    Respiratory/Chest: CTA Bilaterally without wheezes or rales. Normal effort    GI/Abdomen: BS +, non-tender, non-distended Ext: no clubbing, cyanosis, or edema Psych: pleasant and cooperative  Neurological: Ox3- talkative  Alert and oriented Motor 4/5 UE and 4/5 LE's  Assessment/Plan: 1. Functional deficits which require 3+ hours per day of interdisciplinary therapy in a comprehensive inpatient rehab setting. Physiatrist is providing close team supervision and 24 hour management of active medical problems listed below. Physiatrist and rehab team continue to assess barriers to discharge/monitor patient progress toward functional and medical goals  Care Tool:  Bathing    Body parts bathed by patient: Right arm, Left arm, Chest, Abdomen, Front perineal area, Face, Right upper leg, Left upper leg   Body parts bathed by helper: Right  lower leg, Left lower leg, Buttocks, Face     Bathing assist Assist Level: Moderate Assistance - Patient 50 - 74%     Upper Body Dressing/Undressing Upper body dressing   What is the patient wearing?: Pull over shirt    Upper body assist Assist Level: Minimal Assistance - Patient > 75%    Lower Body Dressing/Undressing Lower body dressing      What is the patient wearing?: Underwear/pull up     Lower body assist Assist for lower body dressing: Maximal Assistance - Patient 25 - 49%     Toileting Toileting    Toileting assist Assist for toileting: Contact Guard/Touching assist     Transfers Chair/bed transfer  Transfers assist  Chair/bed transfer activity did not occur: Safety/medical concerns  Chair/bed transfer assist level: Contact Guard/Touching assist     Locomotion Ambulation   Ambulation assist   Ambulation activity did not occur: Safety/medical concerns  Assist level: Contact Guard/Touching assist Assistive device: Walker-rolling Max distance: 45'   Walk 10 feet activity   Assist  Walk 10 feet activity did not occur: Safety/medical concerns  Assist level: Contact Guard/Touching assist Assistive device: Walker-rolling   Walk 50 feet activity   Assist Walk 50 feet with 2 turns activity did not occur: Safety/medical concerns  Walk 150 feet activity   Assist Walk 150 feet activity did not occur: Safety/medical concerns         Walk 10 feet on uneven surface  activity   Assist Walk 10 feet on uneven surfaces activity did not occur: Safety/medical concerns         Wheelchair     Assist Is the patient using a wheelchair?: Yes Type of Wheelchair: Manual Wheelchair activity did not occur: Safety/medical concerns  Wheelchair assist level: Supervision/Verbal cueing, Set up assist Max wheelchair distance: 28'    Wheelchair 50 feet with 2 turns activity    Assist    Wheelchair 50 feet with 2 turns activity did  not occur: Safety/medical concerns   Assist Level: Supervision/Verbal cueing   Wheelchair 150 feet activity     Assist  Wheelchair 150 feet activity did not occur: Safety/medical concerns       Blood pressure 133/87, pulse 77, temperature 98.2 F (36.8 C), temperature source Oral, resp. rate 18, height '5\' 3"'$  (1.6 m), weight 77.3 kg, SpO2 97 %.  Medical Problem List and Plan: 1.  Deficits with mobility, endurance, self-care secondary to debility from osteomyelitis/discitis/septic shock/septic emboli/presumed endocarditis  -Continue CIR therapies including PT, OT  2.  Antithrombotics: -DVT/anticoagulation:  Pharmaceutical: Lovenox             -antiplatelet therapy: Plavix 3. Left buttock pain/back pain- : Oxycodone prn             --continue Cymbalta and Gabapentin.              Monitor with increased exertion  increased cymbalta to 60 mg daily  9/8- pain at rest 0/10- but as soon as moves, spikes- per pt request, will increase Robaxin to 1000 mg q6 hours prn. Will add voltaren gel QID for legs  9/9- pain much better with voltaren gel last night- will con't and d/c muscle rub. Changed Robaxin to 750 mg q6 since made her groggy.  9/10 pain controlled currently with above, she's satisfied 4. Mood: LCSW to follow for evaluation and support.   Started Buspar 5 mg TID for anxiety on 9/6.   -?some improvement             -antipsychotic agents: N/a 5. Neuropsych: This patient is capable of making decisions on her own behalf. 6. Skin/Wound Care: Routine pressure relief measure. Foam dressing to sacrum 7. Fluids/Electrolytes/Nutrition: Intake poor --does not like the food here.             Liberalize diet to regular for flavor.  --Advised husband to bring supplements/food from home 8. MSSA bacteremia w/sepsis:  Zosyn changed to Ancef through 9/22 9. Diskitis:   9/8- will not order MRI after d/w Dr Linus Salmons and only check labs qweek, as long as no fever.              - Appreciate ID recs   10. COPD/Pulmonary septic emboli: continue Ancef-encourage pulmonary toilet with flutter valve             --continue Incurse inhaler daily             Wean supplemental oxygen as tolerated  Continues to require, however decreasing in 9/7  9/9-  11. Gout flare: Continue prednisone taper 12.. Hypokalemia:  . Added supplement as Lasix on board. Potassium 4.1 on 9/6 Supplementing 13. Neurogenic bladder v/s overflow: Foley d/c'ed 08/28 and she has not been able to control her bladder  UA negative              --needs a few pvr's, is continent. Toilet patient every 3 hours  9/9-   q2 hours toileting while awake if needed- but pt says doing better 14.  Loose stools w/incontinence/Neurogenic bowel?: Goes after she eats as well as prn.             --discontinued Miralax --KUB only with gas -no stools recorded yet -continue daily fiber 15. Obesity BMI 30.20: provide dietary counseling.  16. Leukocytosis  Urine culture suggesting multiple species  WBCs 15k on 9/8   9/10 labs for Monday   LOS: 10 days A FACE TO FACE EVALUATION WAS PERFORMED  Meredith Staggers 07/20/2021, 9:50 AM

## 2021-07-21 LAB — GLUCOSE, CAPILLARY
Glucose-Capillary: 93 mg/dL (ref 70–99)
Glucose-Capillary: 105 mg/dL — ABNORMAL HIGH (ref 70–99)
Glucose-Capillary: 183 mg/dL — ABNORMAL HIGH (ref 70–99)
Glucose-Capillary: 133 mg/dL — ABNORMAL HIGH (ref 70–99)

## 2021-07-21 NOTE — Plan of Care (Signed)
  Problem: Consults Goal: RH SPINAL CORD INJURY PATIENT EDUCATION Description:  See Patient Education module for education specifics.  Outcome: Progressing   Problem: SCI BOWEL ELIMINATION Goal: RH STG MANAGE BOWEL WITH ASSISTANCE Description: STG Manage Bowel with  Mod I assistance. Outcome: Progressing Goal: RH STG SCI MANAGE BOWEL WITH MEDICATION WITH ASSISTANCE Description: STG SCI Manage bowel with medication with mod I assistance. Outcome: Progressing Goal: RH STG SCI MANAGE BOWEL PROGRAM W/ASSIST OR AS APPROPRIATE Description: STG SCI Manage bowel program w/ min assist or as appropriate. Outcome: Progressing   Problem: SCI BLADDER ELIMINATION Goal: RH STG MANAGE BLADDER WITH ASSISTANCE Description: STG Manage Bladder With mod I Assistance Outcome: Progressing Goal: RH STG MANAGE BLADDER WITH EQUIPMENT WITH ASSISTANCE Description: STG Manage Bladder With Equipment With min Assistance Outcome: Progressing Goal: RH STG SCI MANAGE BLADDER PROGRAM W/ASSISTANCE Description: W mod I assist Outcome: Progressing   Problem: RH SKIN INTEGRITY Goal: RH STG SKIN FREE OF INFECTION/BREAKDOWN Description: W min assist Outcome: Progressing Goal: RH STG MAINTAIN SKIN INTEGRITY WITH ASSISTANCE Description: STG Maintain Skin Integrity With min Assistance. Outcome: Progressing   Problem: RH SAFETY Goal: RH STG ADHERE TO SAFETY PRECAUTIONS W/ASSISTANCE/DEVICE Description: STG Adhere to Safety Precautions With cues/Assistance/Device. Outcome: Progressing   Problem: RH PAIN MANAGEMENT Goal: RH STG PAIN MANAGED AT OR BELOW PT'S PAIN GOAL Description: At or below level 4 Outcome: Progressing   Problem: RH KNOWLEDGE DEFICIT SCI Goal: RH STG INCREASE KNOWLEDGE OF SELF CARE AFTER SCI Description: W min assist Outcome: Progressing

## 2021-07-22 DIAGNOSIS — I2690 Septic pulmonary embolism without acute cor pulmonale: Secondary | ICD-10-CM | POA: Diagnosis not present

## 2021-07-22 DIAGNOSIS — D72829 Elevated white blood cell count, unspecified: Secondary | ICD-10-CM | POA: Diagnosis not present

## 2021-07-22 DIAGNOSIS — R5381 Other malaise: Secondary | ICD-10-CM | POA: Diagnosis not present

## 2021-07-22 DIAGNOSIS — R7881 Bacteremia: Secondary | ICD-10-CM | POA: Diagnosis not present

## 2021-07-22 DIAGNOSIS — B9561 Methicillin susceptible Staphylococcus aureus infection as the cause of diseases classified elsewhere: Secondary | ICD-10-CM | POA: Diagnosis not present

## 2021-07-22 LAB — COMPREHENSIVE METABOLIC PANEL
ALT: 24 U/L (ref 0–44)
AST: 41 U/L (ref 15–41)
Albumin: 2.7 g/dL — ABNORMAL LOW (ref 3.5–5.0)
Alkaline Phosphatase: 108 U/L (ref 38–126)
Anion gap: 7 (ref 5–15)
BUN: 24 mg/dL — ABNORMAL HIGH (ref 8–23)
CO2: 26 mmol/L (ref 22–32)
Calcium: 9.5 mg/dL (ref 8.9–10.3)
Chloride: 102 mmol/L (ref 98–111)
Creatinine, Ser: 0.67 mg/dL (ref 0.44–1.00)
GFR, Estimated: >60 mL/min (ref >60)
Glucose, Bld: 92 mg/dL (ref 70–99)
Potassium: 4.2 mmol/L (ref 3.5–5.1)
Sodium: 135 mmol/L (ref 135–145)
Total Bilirubin: 0.4 mg/dL (ref 0.3–1.2)
Total Protein: 5.6 g/dL — ABNORMAL LOW (ref 6.5–8.1)

## 2021-07-22 LAB — CBC WITH DIFFERENTIAL/PLATELET
Abs Immature Granulocytes: 0.08 10*3/uL — ABNORMAL HIGH (ref 0.00–0.07)
Basophils Absolute: 0 10*3/uL (ref 0.0–0.1)
Basophils Relative: 0 %
Eosinophils Absolute: 0.1 10*3/uL (ref 0.0–0.5)
Eosinophils Relative: 1 %
HCT: 35.5 % — ABNORMAL LOW (ref 36.0–46.0)
Hemoglobin: 11.2 g/dL — ABNORMAL LOW (ref 12.0–15.0)
Immature Granulocytes: 1 %
Lymphocytes Relative: 16 %
Lymphs Abs: 2.5 10*3/uL (ref 0.7–4.0)
MCH: 29 pg (ref 26.0–34.0)
MCHC: 31.5 g/dL (ref 30.0–36.0)
MCV: 92 fL (ref 80.0–100.0)
Monocytes Absolute: 1.3 10*3/uL — ABNORMAL HIGH (ref 0.1–1.0)
Monocytes Relative: 8 %
Neutro Abs: 11.4 10*3/uL — ABNORMAL HIGH (ref 1.7–7.7)
Neutrophils Relative %: 74 %
Platelets: 414 10*3/uL — ABNORMAL HIGH (ref 150–400)
RBC: 3.86 MIL/uL — ABNORMAL LOW (ref 3.87–5.11)
RDW: 18.1 % — ABNORMAL HIGH (ref 11.5–15.5)
WBC: 15.4 10*3/uL — ABNORMAL HIGH (ref 4.0–10.5)
nRBC: 0 % (ref 0.0–0.2)

## 2021-07-22 LAB — SEDIMENTATION RATE: Sed Rate: 20 mm/hr (ref 0–22)

## 2021-07-22 LAB — GLUCOSE, CAPILLARY
Glucose-Capillary: 90 mg/dL (ref 70–99)
Glucose-Capillary: 116 mg/dL — ABNORMAL HIGH (ref 70–99)
Glucose-Capillary: 133 mg/dL — ABNORMAL HIGH (ref 70–99)

## 2021-07-22 LAB — C-REACTIVE PROTEIN: CRP: 0.8 mg/dL (ref <1.0)

## 2021-07-22 MED ORDER — PREDNISONE 5 MG PO TABS
15.0000 mg | ORAL_TABLET | Freq: Every day | ORAL | Status: DC
  Administered 2021-07-23 – 2021-07-26 (×4): 15 mg via ORAL
  Filled 2021-07-22 (×5): qty 3

## 2021-07-22 NOTE — Progress Notes (Signed)
Physical Therapy Session Note  Patient Details  Name: Samantha King MRN: [id number] Date of Birth: 06/08/1946  Today's Date: 07/22/2021 PT Individual Time: 3825-0539; 7673-4193 PT Individual Time Calculation (min): 39 min and 44 min  Short Term Goals: Week 1:  PT Short Term Goal 1 (Week 1): Pt will perform supine <>sit w/min A PT Short Term Goal 1 - Progress (Week 1): Met PT Short Term Goal 2 (Week 1): Pt will perform sit <>stand w/LRAD and mod A PT Short Term Goal 2 - Progress (Week 1): Met PT Short Term Goal 3 (Week 1): Pt will perform bed <>chair transfers mod A w/LRAD PT Short Term Goal 3 - Progress (Week 1): Met PT Short Term Goal 4 (Week 1): Pt will maintain dynamic sitting balance w/no UE support for 2 minutes w/min A PT Short Term Goal 4 - Progress (Week 1): Met Week 2:  PT Short Term Goal 1 (Week 2): Pt will ambulate 25' w/LRAD and min A PT Short Term Goal 2 (Week 2): Pt will perform sit <>stands from WC w/LRAD and CGA consistently PT Short Term Goal 3 (Week 2): Pt will participate in balance assessment w/LRAD for improved activity tolerance  Skilled Therapeutic Interventions/Progress Updates:  Session 1  Pt received sitting in WC in room, significant R trunk lean and reported 7/10 pain in low back. Pt had received pain meds, offered positional changes and holistic pain strategies throughout session. Pt not connected to O2, SpO2 at 88%, provided cues for pursed lip breathing and SpO2 at 90% on RA. Pt reported urgency to void, sit <>stand from [facility] to RW and pt ambulated 15' to toilet in bathroom w/CGA. Pt doffed pants w/supervision, voided continently and performed peri care independently. Sit <>stand from toilet to RW w/BUE on grab bars and CGA and pt ambulated 15' to sink w/CGA. Pt attempted to wash hands at sink while standing, unable to stand fully erect and leaned forward on sink despite cues to stand up tall. Stand <>sit to [facility] w/supervision. After several  minutes of rest,  sit <>stand from [place] to RW and pt ambulated to nurses station and back (~100') w/RW and CGA. Noted improved step clearance and length bilaterally, increased kyphotic posture as pt fatigued. Multimodal cues for upright posture, pt unresponsive to all. SpO2 at 92%, HR at 99 bpm. Stand <>sit to [facility] in room w/supervision and pt was left seated in WC in room w/all needs in reach, denied pain.   Session 2  Pt received sitting in WC in room, significant lean to R side, denied pain. Nursing present to start IV antibiotics and communicated pt has been having productive cough consistently for past hour. Pt connected to 1L O2 and SpO2 at 90%. Pt transported to main gym in [facility] w/total A for time management.   In // bars for pre-stair training, lateral weight shift practice and single leg stability w/CGA throughout:  -Alt. Standing toe taps to 2" step w/BUE support x10 per side. Progressed to 8" step w/BUE support, x10 per side.  -Progressed to alt. Step up/downs to 8" step, x2 per side w/BUE support. Pt reported 5/10 pain w/hip extension consistently while stepping onto box.   Pt transported back to room w/total A and requested to use bathroom. Sit <>stand from [facility] to RW and pt ambulated 10' to toilet w/CGA. Pt doffed pants w/supervision, voided continently and performed peri care independently. Sit <>stand from toilet to RW w/CGA and pt donned pants w/supervision. Pt ambulated 10' to [facility]  w/supervision and was left at sink in locked [facility] performing ADLs as handoff to OT, all needs in reach.   Therapy Documentation Precautions:  Precautions Precautions: Fall Restrictions Weight Bearing Restrictions: No   Therapy/Group: Individual Therapy Samantha King Samantha King, PT, DPT  07/22/2021, 7:45 AM

## 2021-07-22 NOTE — Plan of Care (Signed)
  Problem: RH Balance Goal: LTG Patient will maintain dynamic standing with ADLs (OT) Description: LTG:  Patient will maintain dynamic standing balance with assist during activities of daily living (OT)  Flowsheets (Taken 07/22/2021 1244) LTG: Pt will maintain dynamic standing balance during ADLs with: (LTG upgraded due to pt's progress.) Supervision/Verbal cueing Note: LTG upgraded due to pt's progress.   Problem: Sit to Stand Goal: LTG:  Patient will perform sit to stand in prep for activites of daily living with assistance level (OT) Description: LTG:  Patient will perform sit to stand in prep for activites of daily living with assistance level (OT) Flowsheets (Taken 07/22/2021 1244) LTG: PT will perform sit to stand in prep for activites of daily living with assistance level: (LTG upgraded due to pt's progress.) Supervision/Verbal cueing Note: LTG upgraded due to pt's progress.   Problem: RH Bathing Goal: LTG Patient will bathe all body parts with assist levels (OT) Description: LTG: Patient will bathe all body parts with assist levels (OT) Flowsheets (Taken 07/22/2021 1244) LTG: Pt will perform bathing with assistance level/cueing: (LTG upgraded due to pt's progress.) Supervision/Verbal cueing Note: LTG upgraded due to pt's progress.   Problem: RH Toileting Goal: LTG Patient will perform toileting task (3/3 steps) with assistance level (OT) Description: LTG: Patient will perform toileting task (3/3 steps) with assistance level (OT)  Flowsheets (Taken 07/22/2021 1244) LTG: Pt will perform toileting task (3/3 steps) with assistance level: (LTG upgraded due to pt's progress.) Supervision/Verbal cueing Note: LTG upgraded due to pt's progress.   Problem: RH Toilet Transfers Goal: LTG Patient will perform toilet transfers w/assist (OT) Description: LTG: Patient will perform toilet transfers with assist, with/without cues using equipment (OT) Flowsheets (Taken 07/22/2021 1244) LTG: Pt will  perform toilet transfers with assistance level of: (LTG upgraded due to pt's progress.) Supervision/Verbal cueing Note: LTG upgraded due to pt's progress.

## 2021-07-22 NOTE — Progress Notes (Signed)
Occupational Therapy Session Note  Patient Details  Name: Samantha King MRN: [id number] Date of Birth: Sep 29, 1946  Today's Date: 07/22/2021 OT Individual Time: 1449-1534 OT Individual Time Calculation (min): 45 min    Short Term Goals: Week 2:  OT Short Term Goal 1 (Week 2): Pt will don pants with reacher with min A, OT Short Term Goal 2 (Week 2): Pt will sit to stand from toilet with S to RW. OT Short Term Goal 3 (Week 2): Pt will self cleanse post toileting with mod A. OT Short Term Goal 4 (Week 2): Pt will tolerate standing at sink to be able to brush her teeth.  Skilled Therapeutic Interventions/Progress Updates:  Unable to get accurate SpO2 reading during session, no s/s of SOB but kept 1L on during session. Pt greeted seated in w/c requesting to work on cleaning up her room. Pt agreeable to stand to fold her clothes. Pt sit<>stand with RW with CGA, pt able to stand for ~ 30 secs before needing to sit, however pt able to complete dynamic reaching task of folding laundry with CGA- supervision, education provided on energy conservation strategies for IADLs such as using Rw bag for laundry tasks or sitting to fold laundry pending level of fatigue.  Pt transported to kitchen with total A for time mgmt where able to retrieve/ transport items from various heights in kitchen using RW bag as energy conservation strategy with CGA. Pt also able to remove items from oven with CGA with RW. Pt returned to room with total A where pt left up in w/c with safety belt activated and all needs within reach.   Therapy Documentation Precautions:  Precautions Precautions: Fall Restrictions Weight Bearing Restrictions: No  Pain: Pt reports no pain during session.    Therapy/Group: Individual Therapy  Corinne Ports [facility] 07/22/2021, 4:06 PM

## 2021-07-22 NOTE — Progress Notes (Signed)
Occupational Therapy Session Note  Patient Details  Name: Samantha King MRN: [id number] Date of Birth: 1946/03/25  Today's Date: 07/22/2021 OT Individual Time: 5053-9767 OT Individual Time Calculation (min): 60 min    Short Term Goals: Week 2:  OT Short Term Goal 1 (Week 2): Pt will don pants with reacher with min A, OT Short Term Goal 2 (Week 2): Pt will sit to stand from toilet with S to RW. OT Short Term Goal 3 (Week 2): Pt will self cleanse post toileting with mod A. OT Short Term Goal 4 (Week 2): Pt will tolerate standing at sink to be able to brush her teeth.  Skilled Therapeutic Interventions/Progress Updates:    Pt received in bed requesting to toilet. Pt sat to EOB with S and then stood with RW with CGA (continues to need cues with technique), ambulated to toilet with CGA. Pt wanted to go without O2 but by the time she sat down she was breathing heavily.  Replaced O2 for pt then she was able to breath more easily. Pt  initially stated she could not cleanse herself after a BM.  Pt cued to scoot to edge of seat and then she did well reaching and cleansing.  She also managed her clothing well.   Pt returned to wc to rest.  Stood at sink to brush teeth.  She then wanted to sit to do her makeup.  Worked on actively moving arms and pt tolerating better.  Pt asked for assistance with trigger point pressure points with deep massage. Pt felt this helped her back.  Pt resting in wc with belt alarm on and all needs met.   Therapy Documentation Precautions:  Precautions Precautions: Fall Restrictions Weight Bearing Restrictions: No Vital Signs: Oxygen Therapy SpO2: 95 % O2 Device: Nasal Cannula Pain:   ADL: ADL Eating: Set up Grooming: Setup Where Assessed-Grooming: Sitting at sink Upper Body Bathing: Setup Lower Body Bathing: Moderate assistance Upper Body Dressing: Minimal assistance Lower Body Dressing: Maximal assistance Toileting: Maximal assistance Where  Assessed-Toileting: Toilet (BSC over toilet) Toilet Transfer: Minimal assistance Toilet Transfer Method: Stand pivot Toilet Transfer Equipment: Bedside commode, Grab bars  Therapy/Group: Individual Therapy  [place] 07/22/2021, 9:20 AM

## 2021-07-22 NOTE — Plan of Care (Signed)
  Problem: Consults Goal: RH SPINAL CORD INJURY PATIENT EDUCATION Description:  See Patient Education module for education specifics.  Outcome: Progressing   Problem: SCI BOWEL ELIMINATION Goal: RH STG MANAGE BOWEL WITH ASSISTANCE Description: STG Manage Bowel with  Mod I assistance. Outcome: Progressing Goal: RH STG SCI MANAGE BOWEL WITH MEDICATION WITH ASSISTANCE Description: STG SCI Manage bowel with medication with mod I assistance. Outcome: Progressing Goal: RH STG SCI MANAGE BOWEL PROGRAM W/ASSIST OR AS APPROPRIATE Description: STG SCI Manage bowel program w/ min assist or as appropriate. Outcome: Progressing   Problem: SCI BLADDER ELIMINATION Goal: RH STG MANAGE BLADDER WITH ASSISTANCE Description: STG Manage Bladder With mod I Assistance Outcome: Progressing Goal: RH STG MANAGE BLADDER WITH EQUIPMENT WITH ASSISTANCE Description: STG Manage Bladder With Equipment With min Assistance Outcome: Progressing Goal: RH STG SCI MANAGE BLADDER PROGRAM W/ASSISTANCE Description: W mod I assist Outcome: Progressing   Problem: RH SKIN INTEGRITY Goal: RH STG SKIN FREE OF INFECTION/BREAKDOWN Description: W min assist Outcome: Progressing Goal: RH STG MAINTAIN SKIN INTEGRITY WITH ASSISTANCE Description: STG Maintain Skin Integrity With min Assistance. Outcome: Progressing   Problem: RH SAFETY Goal: RH STG ADHERE TO SAFETY PRECAUTIONS W/ASSISTANCE/DEVICE Description: STG Adhere to Safety Precautions With cues/Assistance/Device. Outcome: Progressing   Problem: RH PAIN MANAGEMENT Goal: RH STG PAIN MANAGED AT OR BELOW PT'S PAIN GOAL Description: At or below level 4 Outcome: Progressing   Problem: RH KNOWLEDGE DEFICIT SCI Goal: RH STG INCREASE KNOWLEDGE OF SELF CARE AFTER SCI Description: W min assist Outcome: Progressing

## 2021-07-22 NOTE — Plan of Care (Signed)
  Problem: Consults Goal: RH SPINAL CORD INJURY PATIENT EDUCATION Description:  See Patient Education module for education specifics.  Outcome: Progressing   Problem: SCI BOWEL ELIMINATION Goal: RH STG MANAGE BOWEL WITH ASSISTANCE Description: STG Manage Bowel with  Mod I assistance. Outcome: Progressing   Problem: SCI BLADDER ELIMINATION Goal: RH STG MANAGE BLADDER WITH ASSISTANCE Description: STG Manage Bladder With mod I Assistance Outcome: Progressing   Problem: RH SKIN INTEGRITY Goal: RH STG MAINTAIN SKIN INTEGRITY WITH ASSISTANCE Description: STG Maintain Skin Integrity With min Assistance. Outcome: Progressing   Problem: RH SAFETY Goal: RH STG ADHERE TO SAFETY PRECAUTIONS W/ASSISTANCE/DEVICE Description: STG Adhere to Safety Precautions With cues/Assistance/Device. Outcome: Progressing   Problem: RH PAIN MANAGEMENT Goal: RH STG PAIN MANAGED AT OR BELOW PT'S PAIN GOAL Description: At or below level 4 Outcome: Progressing

## 2021-07-22 NOTE — Progress Notes (Signed)
[facility]  Date of Admission:  07/10/2021           Reason for visit: Follow up on lumbar discitis and osteomyelitis  Current antibiotics: Cefazolin  ASSESSMENT:    75 y.o. female admitted with:  Discitis/osteomyelitis of lumbar spine: Currently on Ancef for known MSSA positive blood cultures associated with her back and infection.  The plan is for 6 weeks of antibiotics from negative blood cultures through 08/01/2021 with no indication for repeat MRI unless new or significant clinical concerns develop.  CRP and ESR has been monitored weekly and most recently was checked this morning and both were found to be normal. Leukocytosis: WBC stable at 15.4 today. 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  On treatment as above. Medication monitoring: Continues on weekly lab monitoring while on antibiotics.  RECOMMENDATIONS:    Continue cefazolin per pharmacy through 08/01/2021 Continue weekly CBC, CMP, and inflammatory markers Will follow periodically    Principal Problem:   Debility Active Problems:   Severe muscle deconditioning   MSSA bacteremia   Loose stools   Supplemental oxygen dependent   Leukocytosis    MEDICATIONS:    Scheduled Meds:  vitamin C  500 mg Oral BID   atorvastatin  80 mg Oral q1800   busPIRone  5 mg Oral TID   Chlorhexidine Gluconate Cloth  6 each Topical Daily   clopidogrel  75 mg Oral Daily   colchicine  0.6 mg Oral Daily   diclofenac Sodium  4 g Topical QID   DULoxetine  60 mg Oral Daily   enoxaparin (LOVENOX) injection  40 mg Subcutaneous Q24H   feeding supplement  237 mL Oral TID WC   fluticasone  1 spray Each Nare Daily   furosemide  40 mg Oral Daily   gabapentin  300 mg Oral BID   Gerhardt's butt cream   Topical BID   insulin aspart  0-5 Units Subcutaneous QHS   insulin aspart  0-9 Units Subcutaneous TID WC   lidocaine  1 patch Transdermal Q24H    loratadine  10 mg Oral Daily   metoprolol tartrate  25 mg Oral BID   pantoprazole  40 mg Oral Daily   potassium chloride SA  20 mEq Oral BID   [START ON 07/23/2021] predniSONE  15 mg Oral Q breakfast   psyllium  1 packet Oral Daily   sodium chloride flush  10-40 mL Intracatheter Q12H   umeclidinium bromide  1 puff Inhalation Daily   zinc sulfate  220 mg Oral Daily   Continuous Infusions:   ceFAZolin (ANCEF) IV 2 g (07/22/21 0510)   PRN Meds:.acetaminophen, alum & mag hydroxide-simeth, bisacodyl, diphenhydrAMINE, guaiFENesin-dextromethorphan, ipratropium-albuterol, lidocaine, methocarbamol, oxyCODONE, polyethylene glycol, prochlorperazine **OR** prochlorperazine **OR** prochlorperazine, sodium chloride flush, sodium phosphate, traZODone  SUBJECTIVE:   24 hour events:  No acute events  No new complaints this morning.  She is working with therapy.  No fevers no new back pain or other concerns.  Review of Systems  All other systems reviewed and are negative.    OBJECTIVE:   Blood pressure 114/75, pulse 66, temperature 98 F (36.7 C), resp. rate 14, height 5' 3"  (1.6 m), weight 77.3 kg, SpO2 95 %. Body mass index is 30.2 kg/m.  Physical Exam Constitutional:      General: She is not in acute distress.    Appearance: Normal appearance.  HENT:     Head: Normocephalic and  atraumatic.  Pulmonary:     Effort: Pulmonary effort is normal. No respiratory distress.     Comments: On nasal cannula Neurological:     General: No focal deficit present.     Mental Status: She is alert and oriented to person, place, and time.  Psychiatric:        Mood and Affect: Mood normal.        Behavior: Behavior normal.     Lab Results: Lab Results  Component Value Date   WBC 15.4 (H) 07/22/2021   HGB 11.2 (L) 07/22/2021   HCT 35.5 (L) 07/22/2021   MCV 92.0 07/22/2021   PLT 414 (H) 07/22/2021    Lab Results  Component Value Date   NA 135 07/22/2021   K 4.2 07/22/2021   CO2 26 07/22/2021    GLUCOSE 92 07/22/2021   BUN 24 (H) 07/22/2021   CREATININE 0.67 07/22/2021   CALCIUM 9.5 07/22/2021   GFRNONAA >60 07/22/2021   GFRAA >60 03/12/2020    Lab Results  Component Value Date   ALT 24 07/22/2021   AST 41 07/22/2021   ALKPHOS 108 07/22/2021   BILITOT 0.4 07/22/2021       Component Value Date/Time   CRP 0.8 07/22/2021 0352       Component Value Date/Time   ESRSEDRATE 20 07/22/2021 0352     I have reviewed the micro and lab results in Epic.  Imaging: No results found.   Imaging independently reviewed in Epic.    [facility] [phone] pager 07/22/2021, 10:53 AM  I spent greater than 35 minutes with the patient including greater than 50% of time in face to face counsel of the patient and in coordination of their care.

## 2021-07-22 NOTE — Plan of Care (Signed)
  Problem: RH Balance Goal: LTG Patient will maintain dynamic sitting balance (PT) Description: LTG:  Patient will maintain dynamic sitting balance with assistance during mobility activities (PT) Flowsheets (Taken 07/22/2021 1612) LTG: Pt will maintain dynamic sitting balance during mobility activities with:: Independent Note: Upgraded due to patient's current performance in CIR  Goal: LTG Patient will maintain dynamic standing balance (PT) Description: LTG:  Patient will maintain dynamic standing balance with assistance during mobility activities (PT) Flowsheets (Taken 07/22/2021 1612) LTG: Pt will maintain dynamic standing balance during mobility activities with:: Independent with assistive device  Note: Upgraded due to patient's current performance in CIR    Problem: Sit to Stand Goal: LTG:  Patient will perform sit to stand with assistance level (PT) Description: LTG:  Patient will perform sit to stand with assistance level (PT) Flowsheets (Taken 07/22/2021 1612) LTG: PT will perform sit to stand in preparation for functional mobility with assistance level: Independent with assistive device Note: Upgraded due to patient's current performance in CIR    Problem: RH Bed to Chair Transfers Goal: LTG Patient will perform bed/chair transfers w/assist (PT) Description: LTG: Patient will perform bed to chair transfers with assistance (PT). Flowsheets (Taken 07/22/2021 1612) LTG: Pt will perform Bed to Chair Transfers with assistance level: Independent with assistive device  Note: Upgraded due to patient's current performance in CIR    Problem: RH Car Transfers Goal: LTG Patient will perform car transfers with assist (PT) Description: LTG: Patient will perform car transfers with assistance (PT). Flowsheets (Taken 07/22/2021 1612) LTG: Pt will perform car transfers with assist:: Set up assist  Note: Upgraded due to patient's current performance in CIR    Problem: RH Ambulation Goal: LTG  Patient will ambulate in controlled environment (PT) Description: LTG: Patient will ambulate in a controlled environment, # of feet with assistance (PT). Flowsheets (Taken 07/22/2021 1612) LTG: Pt will ambulate in controlled environ  assist needed:: Supervision/Verbal cueing LTG: Ambulation distance in controlled environment: 150' Note: Upgraded due to patient's current performance in CIR  Goal: LTG Patient will ambulate in home environment (PT) Description: LTG: Patient will ambulate in home environment, # of feet with assistance (PT). Flowsheets (Taken 07/22/2021 1612) LTG: Pt will ambulate in home environ  assist needed:: Supervision/Verbal cueing Note: Upgraded due to patient's current performance in CIR    Problem: RH Stairs Goal: LTG Patient will ambulate up and down stairs w/assist (PT) Description: LTG: Patient will ambulate up and down # of stairs with assistance (PT) Flowsheets Taken 07/22/2021 1612 LTG: Pt will ambulate up/down stairs assist needed:: Supervision/Verbal cueing Taken 07/11/2021 1225 LTG: Pt will  ambulate up and down number of stairs: 4 Note: Upgraded due to patient's current performance in CIR

## 2021-07-22 NOTE — Progress Notes (Signed)
PROGRESS NOTE   Subjective/Complaints:   ROS: Patient denies CP, SOB, N/V/D   Objective:   No results found. Recent Labs    07/22/21 0352  WBC 15.4*  HGB 11.2*  HCT 35.5*  PLT 414*     Recent Labs    07/22/21 0352  NA 135  K 4.2  CL 102  CO2 26  GLUCOSE 92  BUN 24*  CREATININE 0.67  CALCIUM 9.5      Intake/Output Summary (Last 24 hours) at 07/22/2021 0935 Last data filed at 07/22/2021 [id number] Gross per 24 hour  Intake 716 ml  Output 600 ml  Net 116 ml         Physical Exam: Vital Signs Blood pressure 114/75, pulse 66, temperature 98 F (36.7 C), resp. rate 14, height '5\' 3"'$  (1.6 m), weight 77.3 kg, SpO2 95 %.    General: No acute distress Mood and affect are appropriate Heart: Regular rate and rhythm no rubs murmurs or extra sounds Lungs: Clear to auscultation, breathing unlabored, no rales or wheezes Abdomen: Positive bowel sounds, soft nontender to palpation, nondistended Extremities: No clubbing, cyanosis, or edema Skin: No evidence of breakdown, no evidence of rash   Neurological: Ox3- talkative  Alert and oriented Motor 4/5 UE and 4/5 LE's  Assessment/Plan: 1. Functional deficits which require 3+ hours per day of interdisciplinary therapy in a comprehensive inpatient rehab setting. Physiatrist is providing close team supervision and 24 hour management of active medical problems listed below. Physiatrist and rehab team continue to assess barriers to discharge/monitor patient progress toward functional and medical goals  Care Tool:  Bathing    Body parts bathed by patient: Right arm, Left arm, Chest, Abdomen, Front perineal area, Face, Right upper leg, Left upper leg   Body parts bathed by helper: Right lower leg, Left lower leg, Buttocks, Face     Bathing assist Assist Level: Moderate Assistance - Patient 50 - 74%     Upper Body Dressing/Undressing Upper body dressing   What is  the patient wearing?: Pull over shirt    Upper body assist Assist Level: Minimal Assistance - Patient > 75%    Lower Body Dressing/Undressing Lower body dressing      What is the patient wearing?: Underwear/pull up     Lower body assist Assist for lower body dressing: Maximal Assistance - Patient 25 - 49%     Toileting Toileting    Toileting assist Assist for toileting: Contact Guard/Touching assist     Transfers Chair/bed transfer  Transfers assist  Chair/bed transfer activity did not occur: Safety/medical concerns  Chair/bed transfer assist level: Contact Guard/Touching assist     Locomotion Ambulation   Ambulation assist   Ambulation activity did not occur: Safety/medical concerns  Assist level: Supervision/Verbal cueing Assistive device: Walker-rolling Max distance: 75'   Walk 10 feet activity   Assist  Walk 10 feet activity did not occur: Safety/medical concerns  Assist level: Supervision/Verbal cueing Assistive device: Walker-rolling   Walk 50 feet activity   Assist Walk 50 feet with 2 turns activity did not occur: Safety/medical concerns  Assist level: Supervision/Verbal cueing Assistive device: Walker-rolling    Walk 150 feet activity   Assist  Walk 150 feet activity did not occur: Safety/medical concerns         Walk 10 feet on uneven surface  activity   Assist Walk 10 feet on uneven surfaces activity did not occur: Safety/medical concerns         Wheelchair     Assist Is the patient using a wheelchair?: Yes Type of Wheelchair: Manual Wheelchair activity did not occur: Safety/medical concerns  Wheelchair assist level: Supervision/Verbal cueing, Set up assist Max wheelchair distance: 58'    Wheelchair 50 feet with 2 turns activity    Assist    Wheelchair 50 feet with 2 turns activity did not occur: Safety/medical concerns   Assist Level: Supervision/Verbal cueing   Wheelchair 150 feet activity      Assist  Wheelchair 150 feet activity did not occur: Safety/medical concerns       Blood pressure 114/75, pulse 66, temperature 98 F (36.7 C), resp. rate 14, height '5\' 3"'$  (1.6 m), weight 77.3 kg, SpO2 95 %.  Medical Problem List and Plan: 1.  Deficits with mobility, endurance, self-care secondary to debility from L3-4 osteomyelitis/discitis/septic shock/septic emboli/presumed endocarditis Also with L4-5 lumbar stenosis - severe   -Continue CIR therapies including PT, OT  2.  Antithrombotics: -DVT/anticoagulation:  Pharmaceutical: Lovenox             -antiplatelet therapy: Plavix 3. Left buttock pain/back pain- : Oxycodone prn             --continue Cymbalta and Gabapentin.              Monitor with increased exertion  increased cymbalta to 60 mg daily  9/8- pain at rest 0/10- but as soon as moves, spikes- per pt request, will increase Robaxin to 1000 mg q6 hours prn. Will add voltaren gel QID for legs  9/9- pain much better with voltaren gel last night- will con't and d/c muscle rub. Changed Robaxin to 750 mg q6 since made her groggy.  9/10 pain controlled currently with above, she's satisfied 4. Mood: LCSW to follow for evaluation and support.   Started Buspar 5 mg TID for anxiety on 9/6.   -?some improvement             -antipsychotic agents: N/a 5. Neuropsych: This patient is capable of making decisions on her own behalf. 6. Skin/Wound Care: Routine pressure relief measure. Foam dressing to sacrum 7. Fluids/Electrolytes/Nutrition: Intake poor --does not like the food here.             Liberalize diet to regular for flavor.  --Advised husband to bring supplements/food from home 8. MSSA bacteremia w/sepsis:  Zosyn changed to Ancef through 9/22 9. Diskitis: Likely Tricuspid valve as source given pulm infectious nodules , TTE no vegetation , no TEE done   .              - Appreciate ID recs  10. COPD/Pulmonary septic emboli: continue Ancef-encourage pulmonary toilet with  flutter valve             --continue Incurse inhaler daily             Wean supplemental oxygen as tolerated  Continues to require, however decreasing in 9/7  9/9-  11. Gout flare: start prednisone taper down to '15mg'$  daily on 9/12 12.. Hypokalemia:  . Added supplement as Lasix on board. Potassium 4.1 on 9/6 Supplementing 13. Neurogenic bladder v/s overflow: retention resolved  14.  Loose stools w/incontinence/Neurogenic bowel?: Goes after she eats as well as  prn.             --discontinued Miralax --KUB only with gas -no stools recorded yet -continue daily fiber 15. Obesity BMI 30.20: provide dietary counseling.  16. Leukocytosis- afebrile   Urine culture suggesting multiple species  WBCs 15k on 9/8   9/12 WBC stable at 15.4 on prednisone , no fever  LOS: 12 days A FACE TO FACE EVALUATION WAS PERFORMED  Charlett Blake 07/22/2021, 9:35 AM

## 2021-07-23 DIAGNOSIS — R5381 Other malaise: Secondary | ICD-10-CM | POA: Diagnosis not present

## 2021-07-23 LAB — GLUCOSE, CAPILLARY
Glucose-Capillary: 90 mg/dL (ref 70–99)
Glucose-Capillary: 114 mg/dL — ABNORMAL HIGH (ref 70–99)
Glucose-Capillary: 166 mg/dL — ABNORMAL HIGH (ref 70–99)
Glucose-Capillary: 103 mg/dL — ABNORMAL HIGH (ref 70–99)

## 2021-07-23 NOTE — Progress Notes (Signed)
Occupational Therapy Session Note  Patient Details  Name: Samantha King MRN: [id number] Date of Birth: 05-19-1946  Today's Date: 07/23/2021 OT Individual Time: 1351-1436 OT Individual Time Calculation (min): 45 min    Short Term Goals: Week 2:  OT Short Term Goal 1 (Week 2): Pt will don pants with reacher with min A, OT Short Term Goal 2 (Week 2): Pt will sit to stand from toilet with S to RW. OT Short Term Goal 3 (Week 2): Pt will self cleanse post toileting with mod A. OT Short Term Goal 4 (Week 2): Pt will tolerate standing at sink to be able to brush her teeth.  Skilled Therapeutic Interventions/Progress Updates:    Pt received semi-reclined in bed with pastor present, agreeable to therapy. Session focus on self-care retraining, activity tolerance, IADL retraining, func transfers, energy conservation education in prep for improved ADL/IADL/func mobility performance + decreased caregiver burden. Reports ongoing back pain, did not rate, RN present to administer pain rx. Came to sitting EOB with close S and increased time. Donned B shoes with min A to adjust heels in shoe. Questions if she can wear her flip flops instead at home, educated on importance of wearing shoes with good support/non slip to decrease falls risk. Verbalized understanding.  Short amb transfer > chair in front of dresser with CGA + RW. Pt req to sort/fold clothes. Opted to sit down to fold for energy conservation. Discussed IADL energy conservation techniques (doing tasks seated, prioritizing, delegating tasks, home set-up to decrease falls risk, etc.)   Total A w/c transport to and from gym 2/2 time management and energy conservation  In kitchen, able to retrieve several items to scramble eggs. Assisted in retrieving items from lower cabinets and cleaning 2/2 time constraints. Pt scrambled eggs with min Vcs for positioning with RW. Req seated rest break halfway through 2/2 sudden back "spasm." Reviewed importance of  easing back into cooking, doing tasks seated if able, kitchen set-up, etc. As pt reports she is a "big cook" and cooks several course meals typically. She does expect to have meal help from friends upon DC.   Pt on 1L of O2 via [place] throughout, satO2 at 91% post activity.    Pt left seated in w/c with safety belt alarm engaged, call bell in reach, and all immediate needs met.    Therapy Documentation Precautions:  Precautions Precautions: Fall Restrictions Weight Bearing Restrictions: No  Pain: ongoing back pain, did not rate   ADL: See Care Tool for more details.   Therapy/Group: Individual Therapy  Volanda Napoleon MS, OTR/L  07/23/2021, 6:48 AM

## 2021-07-23 NOTE — Progress Notes (Signed)
Physical Therapy Session Note  Patient Details  Name: Samantha King MRN: [id number] Date of Birth: 1946/10/30  Today's Date: 07/23/2021 PT Individual Time: 1000-1101; 8875-7972 PT Individual Time Calculation (min): 61 min and 32 min  Short Term Goals: Week 1:  PT Short Term Goal 1 (Week 1): Pt will perform supine <>sit w/min A PT Short Term Goal 1 - Progress (Week 1): Met PT Short Term Goal 2 (Week 1): Pt will perform sit <>stand w/LRAD and mod A PT Short Term Goal 2 - Progress (Week 1): Met PT Short Term Goal 3 (Week 1): Pt will perform bed <>chair transfers mod A w/LRAD PT Short Term Goal 3 - Progress (Week 1): Met PT Short Term Goal 4 (Week 1): Pt will maintain dynamic sitting balance w/no UE support for 2 minutes w/min A PT Short Term Goal 4 - Progress (Week 1): Met Week 2:  PT Short Term Goal 1 (Week 2): Pt will ambulate 25' w/LRAD and min A PT Short Term Goal 2 (Week 2): Pt will perform sit <>stands from WC w/LRAD and CGA consistently PT Short Term Goal 3 (Week 2): Pt will participate in balance assessment w/LRAD for improved activity tolerance  Skilled Therapeutic Interventions/Progress Updates:  Session 1 Pt received sitting in WC at sink, on 1 L O2, denied pain and was agreeable to PT. Had received pain meds prior to session. Transferred pt to mobile O2 on 1L and pt self-propelled >200' w/supervision from room to ortho gym, noted L path deviation 2/2 R trunk lean, corrected w/verbal cues. SpO2 hard to take via finger throughout session due to pt's nail polish and fake nails, encouraged pt to perform pursed lip breathing throughout session. Sit <>stand from WC/car throughout session w/supervision to RW. Pt performed car transfer w/supervision, good carryover from pervious CIR stay regarding technique. Pt self-propelled 100' from ortho to main gym w/supervision and ascended/descended 8 6" steps w/BUE support and step-to pattern w/CGA. Noted significant kyphotic posture while  ascending steps, multimodal cues for erect posture. Educated patient on back precautions to use as guide with all movement, pt verbalized understanding but continues to demonstrate forward flexed posture during functional mobility. Pt transported back to room w/total A 2/2 fatigue and reported urgency to void. Pt ambulated 10' to toilet w/supervision and RW, voided continently and performed peri care independently. Sit <>stand from toilet to RW and ot ambulated 10' to [facility] w/supervision. Pt washed hands at sink seated independently and was left seated in WC, denied pain, all needs in reach.   Session 2 Pt received sitting in WC in room, brother present in room. Pt denied pain and had received meds prior to session. Pt on 1L of O2, transferred to portable O2 tank, SpO2 at 92%. Sit <>stand from [facility] to RW and pt ambulated >200' w/supervision and one seated rest break. Verbal cues for trunk extension intermittently and to maintain safe distance to RW. Pt performed 3 reps during 30s STS w/BUE support, very slow and controlled despite instructions for quantity. Pt was left seated in WC in room, all needs in reach, denied pain.    Therapy Documentation Precautions:  Precautions Precautions: Fall Restrictions Weight Bearing Restrictions: No   Therapy/Group: Individual Therapy Cruzita Lederer Tyrome Donatelli, PT, DPT  07/23/2021, 7:41 AM

## 2021-07-23 NOTE — Progress Notes (Signed)
PROGRESS NOTE   Subjective/Complaints:  No back pain , no LE pain or numbness  ROS: Patient denies CP, SOB, N/V/D   Objective:   No results found. Recent Labs    07/22/21 0352  WBC 15.4*  HGB 11.2*  HCT 35.5*  PLT 414*     Recent Labs    07/22/21 0352  NA 135  K 4.2  CL 102  CO2 26  GLUCOSE 92  BUN 24*  CREATININE 0.67  CALCIUM 9.5       Intake/Output Summary (Last 24 hours) at 07/23/2021 0853 Last data filed at 07/23/2021 0823 Gross per 24 hour  Intake 118 ml  Output --  Net 118 ml         Physical Exam: Vital Signs Blood pressure 121/82, pulse 71, temperature 98 F (36.7 C), temperature source Oral, resp. rate 18, height '5\' 3"'$  (1.6 m), weight 77.3 kg, SpO2 97 %.   General: No acute distress Mood and affect are appropriate Heart: Regular rate and rhythm no rubs murmurs or extra sounds Lungs: Clear to auscultation, breathing unlabored, no rales or wheezes Abdomen: Positive bowel sounds, soft nontender to palpation, nondistended Extremities: No clubbing, cyanosis, or edema Skin: No evidence of breakdown, no evidence of rash  Neurological: Ox3- talkative  Alert and oriented Motor 5/5 UE and 4/5 LE's  Assessment/Plan: 1. Functional deficits which require 3+ hours per day of interdisciplinary therapy in a comprehensive inpatient rehab setting. Physiatrist is providing close team supervision and 24 hour management of active medical problems listed below. Physiatrist and rehab team continue to assess barriers to discharge/monitor patient progress toward functional and medical goals  Care Tool:  Bathing    Body parts bathed by patient: Right arm, Left arm, Chest, Abdomen, Front perineal area, Face, Right upper leg, Left upper leg   Body parts bathed by helper: Right lower leg, Left lower leg, Buttocks, Face     Bathing assist Assist Level: Moderate Assistance - Patient 50 - 74%      Upper Body Dressing/Undressing Upper body dressing   What is the patient wearing?: Pull over shirt    Upper body assist Assist Level: Minimal Assistance - Patient > 75%    Lower Body Dressing/Undressing Lower body dressing      What is the patient wearing?: Underwear/pull up     Lower body assist Assist for lower body dressing: Maximal Assistance - Patient 25 - 49%     Toileting Toileting    Toileting assist Assist for toileting: Contact Guard/Touching assist     Transfers Chair/bed transfer  Transfers assist  Chair/bed transfer activity did not occur: Safety/medical concerns  Chair/bed transfer assist level: Contact Guard/Touching assist     Locomotion Ambulation   Ambulation assist   Ambulation activity did not occur: Safety/medical concerns  Assist level: Supervision/Verbal cueing Assistive device: Walker-rolling Max distance: 100'   Walk 10 feet activity   Assist  Walk 10 feet activity did not occur: Safety/medical concerns  Assist level: Supervision/Verbal cueing Assistive device: Walker-rolling   Walk 50 feet activity   Assist Walk 50 feet with 2 turns activity did not occur: Safety/medical concerns  Assist level: Supervision/Verbal cueing Assistive device: Walker-rolling  Walk 150 feet activity   Assist Walk 150 feet activity did not occur: Safety/medical concerns         Walk 10 feet on uneven surface  activity   Assist Walk 10 feet on uneven surfaces activity did not occur: Safety/medical concerns         Wheelchair     Assist Is the patient using a wheelchair?: Yes Type of Wheelchair: Manual Wheelchair activity did not occur: Safety/medical concerns  Wheelchair assist level: Supervision/Verbal cueing, Set up assist Max wheelchair distance: 75'    Wheelchair 50 feet with 2 turns activity    Assist    Wheelchair 50 feet with 2 turns activity did not occur: Safety/medical concerns   Assist Level:  Supervision/Verbal cueing   Wheelchair 150 feet activity     Assist  Wheelchair 150 feet activity did not occur: Safety/medical concerns       Blood pressure 121/82, pulse 71, temperature 98 F (36.7 C), temperature source Oral, resp. rate 18, height '5\' 3"'$  (1.6 m), weight 77.3 kg, SpO2 97 %.  Medical Problem List and Plan: 1.  Deficits with mobility, endurance, self-care secondary to debility from L3-4 osteomyelitis/discitis/septic shock/septic emboli/presumed endocarditis Also with L4-5 lumbar stenosis - severe   -Continue CIR therapies including PT, OT  2.  Antithrombotics: -DVT/anticoagulation:  Pharmaceutical: Lovenox             -antiplatelet therapy: Plavix 3. Left buttock pain/back pain- : Oxycodone prn             --continue Cymbalta and Gabapentin.              Monitor with increased exertion  increased cymbalta to 60 mg daily  9/8- pain at rest 0/10- but as soon as moves, spikes- per pt request, will increase Robaxin to 1000 mg q6 hours prn. Will add voltaren gel QID for legs  9/9- pain much better with voltaren gel last night- will con't and d/c muscle rub. Changed Robaxin to 750 mg q6 since made her groggy.  9/13 pain improved  4. Mood: LCSW to follow for evaluation and support.   Started Buspar 5 mg TID for anxiety on 9/6.   -?some improvement             -antipsychotic agents: N/a 5. Neuropsych: This patient is capable of making decisions on her own behalf. 6. Skin/Wound Care: Routine pressure relief measure. Foam dressing to sacrum 7. Fluids/Electrolytes/Nutrition: Intake poor --does not like the food here.             Liberalize diet to regular for flavor.  --Advised husband to bring supplements/food from home 8. MSSA bacteremia w/sepsis:  Zosyn changed to Ancef through 9/22 9. Diskitis: Likely Tricuspid valve as source given pulm infectious nodules , TTE no vegetation , no TEE done   .              - Appreciate ID recs  10. COPD/Pulmonary septic emboli:  continue Ancef-encourage pulmonary toilet with flutter valve             --continue Incurse inhaler daily             Wean supplemental oxygen as tolerated  Continues to require, however decreasing in 9/7  9/9-  11. Gout flare: start prednisone taper down to '15mg'$  daily on 9/12, monitor for recurrent toe/foot pain 12.. Hypokalemia:  . Added supplement as Lasix on board. Potassium 4.1 on 9/6 Supplementing 13. Neurogenic bladder v/s overflow: retention resolved  14.  Loose stools w/incontinence/Neurogenic bowel?: Goes after she eats as well as prn.             --discontinued Miralax --KUB only with gas -no stools recorded yet -continue daily fiber 15. Obesity BMI 30.20: provide dietary counseling.  16. Leukocytosis- afebrile   Urine culture suggesting multiple species  WBCs 15k on 9/8   9/12 WBC stable at 15.4 on prednisone , no fever  LOS: 13 days A FACE TO FACE EVALUATION WAS PERFORMED  Charlett Blake 07/23/2021, 8:53 AM

## 2021-07-23 NOTE — Progress Notes (Signed)
Occupational Therapy Session Note  Patient Details  Name: Samantha King MRN: [id number] Date of Birth: Feb 21, 1946  Today's Date: 07/23/2021 OT Individual Time: [id number] OT Individual Time Calculation (min): 65 min    Short Term Goals: Week 2:  OT Short Term Goal 1 (Week 2): Pt will don pants with reacher with min A, OT Short Term Goal 2 (Week 2): Pt will sit to stand from toilet with S to RW. OT Short Term Goal 3 (Week 2): Pt will self cleanse post toileting with mod A. OT Short Term Goal 4 (Week 2): Pt will tolerate standing at sink to be able to brush her teeth.  Skilled Therapeutic Interventions/Progress Updates:    Pt received in bed and agreeable to therapy/shower.  See ADL documentation below for details. Overall she is doing very well but needs continued practice with dynamic standing balance and endurance and use of a sock aide and shoe horn for donning foot wear.   Pt only on 1L of O2 and she wanted to try to go without it but her sats decreased to 88% so used O2 for most of the session except when in the shower.  Pt did get a little short of breath with exertion with showering.  She is now able to reach her bottom for self cleansing demonstrating improved trunk mobility.  Pt resting in wc working on drying her hair. Belt alarm on.   Therapy Documentation Precautions:  Precautions Precautions: Fall Restrictions Weight Bearing Restrictions: No   Pain:            no pain at rest, but 6/10 for short intervals with movement only   ADL: ADL Eating: Set up Grooming: Setup Where Assessed-Grooming: Sitting at sink Upper Body Bathing: Supervision/safety Where Assessed-Upper Body Bathing: Shower Lower Body Bathing: Contact guard Where Assessed-Lower Body Bathing: Shower Upper Body Dressing: Setup Lower Body Dressing: Minimal assistance Toileting: Contact guard Where Assessed-Toileting: Glass blower/designer: Therapist, music Method: Air traffic controller: Energy manager: Curator Method: Heritage manager: Grab bars, Transfer tub bench   Therapy/Group: Individual Therapy  [place] 07/23/2021, 10:05 AM

## 2021-07-23 NOTE — Patient Care Conference (Signed)
Inpatient RehabilitationTeam Conference and Plan of Care Update Date: 07/23/2021   Time: 12:00 PM    Patient Name: [place] Record Number: [id number]  Date of Birth: 06-19-1946 Sex: Female         Room/Bed: 4W06C/4W06C-01 Payor Info: Payor: MEDICARE / Plan: MEDICARE PART A AND B / Product Type: *No Product type* /    Admit Date/Time:  07/10/2021  3:44 PM  Primary Diagnosis:  [facility] Problems: Principal Problem:   Debility Active Problems:   Severe muscle deconditioning   MSSA bacteremia   Loose stools   Supplemental oxygen dependent   Leukocytosis    Expected Discharge Date: Expected Discharge Date: 08/01/21  Team Members Present: Physician leading conference: Dr. Delice Lesch Social Worker Present: Ovidio Kin, LCSW Nurse Present: Dorien Chihuahua, RN PT Present: Francena Hanly, PT OT Present: Meriel Pica, OT SLP Present: Sherren Kerns, SLP PPS Coordinator present : Gunnar Fusi, SLP     Current Status/Progress Goal Weekly Team Focus  Bowel/Bladder             Swallow/Nutrition/ Hydration             ADL's   excellent progress this week!  set up UB self care, min -mod with LB self care;  CGA with sit to stand and transfers  LTGs have been upgraded to supervision except  ADL retraining, AE/DME training, functional mobility, endurance, family/pt ed   Mobility   Supervision bed mobility, CGA transfers & gait  Upgraded to mod I-supervision  Transfers, gait training, dynamic balance, pain management   Communication             Safety/Cognition/ Behavioral Observations            Pain             Skin               Discharge Planning:  Husband is here daily observed in therapies. Pt has made remarkable progress this week and goals upgraded she is very pleased with herself   Team Discussion: Ancef continued through 08/01/21. Weaning O2 down to 1L/Min [place] however occasional coughing spell and raspy voice. MD monitoring. Needs  encouragement however, patient is making steady progress.  Patient on target to meet rehab goals: Yes; goals for discharge set at Mod I overall  *See Care Plan and progress notes for long and short-term goals.   Revisions to Treatment Plan:  Upgraded goals to supervision with exception of need for assistance to don socks and shoes.   Teaching Needs: Energy conservation, safety, transfers, medications, dietary modifications, etc  Current Barriers to Discharge: Decreased caregiver support, Home enviroment access/layout, IV antibiotics, and New oxygen  Possible Resolutions to Barriers: Family education with spouse     Medical Summary Current Status: Deficits with mobility, endurance, self-care secondary to debility from L3-4 osteomyelitis/discitis/septic shock/septic emboli/presumed endocarditis  Also with L4-5 lumbar stenosis  Barriers to Discharge: Decreased family/caregiver support;Home enviroment access/layout;Medical stability;IV antibiotics;New oxygen;Weight;Wound care   Possible Resolutions to Barriers/Weekly Focus: Therapies, weaning steroids for gout, follow labs - WBCs, weaning supplemental oxygen   Continued Need for Acute Rehabilitation Level of Care: The patient requires daily medical management by a physician with specialized training in physical medicine and rehabilitation for the following reasons: Direction of a multidisciplinary physical rehabilitation program to maximize functional independence : Yes Medical management of patient stability for increased activity during participation in an intensive rehabilitation regime.: Yes Analysis of laboratory  values and/or radiology reports with any subsequent need for medication adjustment and/or medical intervention. : Yes   I attest that I was present, lead the team conference, and concur with the assessment and plan of the team.   Dorien Chihuahua B 07/23/2021, 12:24 PM

## 2021-07-23 NOTE — Progress Notes (Signed)
Patient ID: Samantha King, female   DOB: January 29, 1946, 75 y.o.   MRN: [id number]  Met with pt to discuss team conference progress toward her goals and the fact they were upgraded to supervision level due to her progress in therapies. She is very pleased with this and is working really hard in her therapies. Therapy is weaning off O2 also. Her last day of IV antibiotics is 9/22 same as her discharge date so no need for this at discharge. Will work on discharge needs and see when husband will need to come in for education prior to discharge home.

## 2021-07-24 DIAGNOSIS — Z9981 Dependence on supplemental oxygen: Secondary | ICD-10-CM | POA: Diagnosis not present

## 2021-07-24 DIAGNOSIS — R7881 Bacteremia: Secondary | ICD-10-CM | POA: Diagnosis not present

## 2021-07-24 DIAGNOSIS — D72829 Elevated white blood cell count, unspecified: Secondary | ICD-10-CM | POA: Diagnosis not present

## 2021-07-24 DIAGNOSIS — R5381 Other malaise: Secondary | ICD-10-CM | POA: Diagnosis not present

## 2021-07-24 LAB — GLUCOSE, CAPILLARY
Glucose-Capillary: 97 mg/dL (ref 70–99)
Glucose-Capillary: 104 mg/dL — ABNORMAL HIGH (ref 70–99)
Glucose-Capillary: 127 mg/dL — ABNORMAL HIGH (ref 70–99)
Glucose-Capillary: 153 mg/dL — ABNORMAL HIGH (ref 70–99)

## 2021-07-24 MED ORDER — HEPARIN SOD (PORK) LOCK FLUSH 100 UNIT/ML IV SOLN
250.0000 [IU] | INTRAVENOUS | Status: DC | PRN
  Filled 2021-07-24: qty 2.5

## 2021-07-24 NOTE — Progress Notes (Signed)
Nutrition Follow-up  DOCUMENTATION CODES:   Not applicable  INTERVENTION:  Continue Ensure Enlive po TID, each supplement provides 350 kcal and 20 grams of protein.   Encourage adequate PO intake.   NUTRITION DIAGNOSIS:   Increased nutrient needs related to acute illness (Sepsis & Rehab) as evidenced by estimated needs; ongoing  GOAL:   Patient will meet greater than or equal to 90% of their needs; progressing  MONITOR:   PO intake, Supplement acceptance  REASON FOR ASSESSMENT:   Consult Other (Comment)  ASSESSMENT:   Pt admitted to CIR from [facility] for continued rehab due to sepsis complications. Pt with PMH of COPD, HF, HTN, and GERD. Pt initially admitted to [facility] for back pain and sepsis due to MSSA bacteremia. Deficits with mobility, endurance, self-care secondary to debility from osteomyelitis/discitis/septic shock/septic emboli/presumed endocarditis  Meal completion has been 50-100%. Pt currently has Ensure ordered and has been consuming them. Family to bring in food from home for pt. RD to continue with current nutritional supplement orders to aid in caloric and protein needs.    Labs and medications reviewed.   Diet Order:   Diet Order             Diet regular Room service appropriate? Yes; Fluid consistency: Thin  Diet effective now                   EDUCATION NEEDS:   No education needs have been identified at this time  Skin:  Skin Assessment: Reviewed RN Assessment  Last BM:  9/14  Height:   Ht Readings from Last 1 Encounters:  07/10/21 '5\' 3"'$  (1.6 m)    Weight:   Wt Readings from Last 1 Encounters:  07/10/21 77.3 kg    Ideal Body Weight:  52 kg  BMI:  Body mass index is 30.2 kg/m.  Estimated Nutritional Needs:   Kcal:  1700-1900  Protein:  85-100 grams  Fluid:  1700-1900 mL  Corrin Parker, MS, RD, LDN RD pager number/after hours weekend pager number on Amion.

## 2021-07-24 NOTE — Progress Notes (Signed)
Physical Therapy Session Note  Patient Details  Name: Samantha King MRN: [id number] Date of Birth: 02-27-1946  Today's Date: 07/24/2021 PT Individual Time: 1500-1525 PT Individual Time Calculation (min): 25 min   Short Term Goals: Week 1:  PT Short Term Goal 1 (Week 1): Pt will perform supine <>sit w/min A PT Short Term Goal 1 - Progress (Week 1): Met PT Short Term Goal 2 (Week 1): Pt will perform sit <>stand w/LRAD and mod A PT Short Term Goal 2 - Progress (Week 1): Met PT Short Term Goal 3 (Week 1): Pt will perform bed <>chair transfers mod A w/LRAD PT Short Term Goal 3 - Progress (Week 1): Met PT Short Term Goal 4 (Week 1): Pt will maintain dynamic sitting balance w/no UE support for 2 minutes w/min A PT Short Term Goal 4 - Progress (Week 1): Met Week 2:  PT Short Term Goal 1 (Week 2): Pt will ambulate 25' w/LRAD and min A PT Short Term Goal 2 (Week 2): Pt will perform sit <>stands from WC w/LRAD and CGA consistently PT Short Term Goal 3 (Week 2): Pt will participate in balance assessment w/LRAD for improved activity tolerance  Skilled Therapeutic Interventions/Progress Updates:   Received pt sitting in [facility] with RNs present hooking up IV antibiotics. Pt agreeable to PT treatment and reported 0/10 low back pain at rest but increasing to 7/10 with mobility (premedicated); pt reported kpad helps significantly. Pt on RA throughout session but difficulty getting O2 sat reading due to fake nails. Session with emphasis on functional mobility/transfers, dynamic standing balance/coordination, gait training, and improved activity tolerance. Donned shoes with max A for time management purposes. Sit<>stand with RW and supervision and ambulated 89f with RW and supervision. Pt able to self-cue for upright posture. Pt denied any SOB afterwards but required seated rest break. Pt then requested to sit at sink and curl her hair in preparation for her husband coming. Concluded session with pt sitting in  WC at sink curling her hair with all needs within reach.   Therapy Documentation Precautions:  Precautions Precautions: Fall Restrictions Weight Bearing Restrictions: No  Therapy/Group: Individual Therapy AAlfonse AlpersPT, DPT   07/24/2021, 7:37 AM

## 2021-07-24 NOTE — Progress Notes (Signed)
Occupational Therapy Session Note  Patient Details  Name: Samantha King MRN: [id number] Date of Birth: 07-20-1946  Today's Date: 07/24/2021 OT Group Time: 1330-1430 OT Group Time Calculation (min): 60 min   Short Term Goals: Week 2:  OT Short Term Goal 1 (Week 2): Pt will don pants with reacher with min A, OT Short Term Goal 2 (Week 2): Pt will sit to stand from toilet with S to RW. OT Short Term Goal 3 (Week 2): Pt will self cleanse post toileting with mod A. OT Short Term Goal 4 (Week 2): Pt will tolerate standing at sink to be able to brush her teeth.  Skilled Therapeutic Interventions/Progress Updates:  Pt participated in group session with a focus on stress mgmt, education on healthy coping strategies, and social interaction. Focus of session on providing coping strategies to manage new current level of function as a result of new diagnosis.  Session focus on breaking down stressors into "daily hassles," "major life stressors" and "life circumstances" in an effort to allow pts to chunk their stressors into groups. Pt actively sharing stressors and contributing to group conversation. Offered education on factors that protect [place] against stress such as "daily uplifts," "healthy coping strategies" and "protective factors." Encouraged all group members to make an effort to actively recall one event from their day that was a daily uplift in an effort to protect their mindset from stressors. Issued pt handouts on healthy coping strategies to implement into routine. Pt transported back to room by this OTA where pt was handed off to RN for toileting needs.  Therapy Documentation Precautions:  Precautions Precautions: Fall Restrictions Weight Bearing Restrictions: No  Pain: Pt reports no pain during group session.    Therapy/Group: Group Therapy  Precious Haws 07/24/2021, 3:50 PM

## 2021-07-24 NOTE — Progress Notes (Signed)
PROGRESS NOTE   Subjective/Complaints: Patient seen sitting up in her chair.  She states she slept well overnight.  She notes she has been off of supplemental O2 this AM.  She has questions regarding prognosis for back pain.   ROS: Denies CP, SOB, N/V/D  Objective:   No results found. Recent Labs    07/22/21 0352  WBC 15.4*  HGB 11.2*  HCT 35.5*  PLT 414*     Recent Labs    07/22/21 0352  NA 135  K 4.2  CL 102  CO2 26  GLUCOSE 92  BUN 24*  CREATININE 0.67  CALCIUM 9.5       Intake/Output Summary (Last 24 hours) at 07/24/2021 1213 Last data filed at 07/23/2021 2024 Gross per 24 hour  Intake 358 ml  Output --  Net 358 ml         Physical Exam: Vital Signs Blood pressure 116/64, pulse 67, temperature 98.2 F (36.8 C), temperature source Oral, resp. rate 16, height '5\' 3"'$  (1.6 m), weight 77.3 kg, SpO2 98 %. Constitutional: No distress . Vital signs reviewed. HENT: Normocephalic.  Atraumatic. Eyes: EOMI. No discharge. Cardiovascular: No JVD.  RRR. Respiratory: Normal effort.  No stridor.  Bilateral clear to auscultation. GI: Non-distended.  BS +. Skin: Warm and dry.  Intact. Psych: Normal mood.  Normal behavior. Musc: No edema in extremities.  No tenderness in extremities. Neuro: Alert and oriented Motor 5/5 UE and 4-4+/5 LE's  Assessment/Plan: 1. Functional deficits which require 3+ hours per day of interdisciplinary therapy in a comprehensive inpatient rehab setting. Physiatrist is providing close team supervision and 24 hour management of active medical problems listed below. Physiatrist and rehab team continue to assess barriers to discharge/monitor patient progress toward functional and medical goals  Care Tool:  Bathing    Body parts bathed by patient: Right arm, Left arm, Chest, Abdomen, Front perineal area, Face, Right upper leg, Left upper leg, Buttocks, Right lower leg, Left lower leg    Body parts bathed by helper: Right lower leg, Left lower leg, Buttocks, Face     Bathing assist Assist Level: Contact Guard/Touching assist     Upper Body Dressing/Undressing Upper body dressing   What is the patient wearing?: Pull over shirt    Upper body assist Assist Level: Set up assist    Lower Body Dressing/Undressing Lower body dressing      What is the patient wearing?: Underwear/pull up, Pants     Lower body assist Assist for lower body dressing: Contact Guard/Touching assist     Toileting Toileting    Toileting assist Assist for toileting: Contact Guard/Touching assist     Transfers Chair/bed transfer  Transfers assist  Chair/bed transfer activity did not occur: Safety/medical concerns  Chair/bed transfer assist level: Supervision/Verbal cueing     Locomotion Ambulation   Ambulation assist   Ambulation activity did not occur: Safety/medical concerns  Assist level: Supervision/Verbal cueing Assistive device: Walker-rolling Max distance: 100'   Walk 10 feet activity   Assist  Walk 10 feet activity did not occur: Safety/medical concerns  Assist level: Supervision/Verbal cueing Assistive device: Walker-rolling   Walk 50 feet activity   Assist Walk 50  feet with 2 turns activity did not occur: Safety/medical concerns  Assist level: Supervision/Verbal cueing Assistive device: Walker-rolling    Walk 150 feet activity   Assist Walk 150 feet activity did not occur: Safety/medical concerns         Walk 10 feet on uneven surface  activity   Assist Walk 10 feet on uneven surfaces activity did not occur: Safety/medical concerns         Wheelchair     Assist Is the patient using a wheelchair?: Yes Type of Wheelchair: Manual Wheelchair activity did not occur: Safety/medical concerns  Wheelchair assist level: Set up assist, Supervision/Verbal cueing Max wheelchair distance: >150'    Wheelchair 50 feet with 2 turns  activity    Assist    Wheelchair 50 feet with 2 turns activity did not occur: Safety/medical concerns   Assist Level: Supervision/Verbal cueing   Wheelchair 150 feet activity     Assist  Wheelchair 150 feet activity did not occur: Safety/medical concerns   Assist Level: Supervision/Verbal cueing    Medical Problem List and Plan: 1.  Deficits with mobility, endurance, self-care secondary to debility from L3-4 osteomyelitis/discitis/septic shock/septic emboli/presumed endocarditis Also with L4-5 lumbar stenosis - severe   Cont CIR 2.  Antithrombotics: -DVT/anticoagulation:  Pharmaceutical: Lovenox             -antiplatelet therapy: Plavix 3. Left buttock pain/back pain- : Oxycodone prn             Continue Cymbalta and Gabapentin.              Monitor with increased exertion  increased cymbalta to 60 mg daily  Robaxin to 1000 mg q6 hours prn, decreased to 750 on 9/8.  Added voltaren gel QID for legs Controlled on 9/14 4. Mood: LCSW to follow for evaluation and support.   Started Buspar 5 mg TID for anxiety on 9/6.              -antipsychotic agents: N/a 5. Neuropsych: This patient is capable of making decisions on her own behalf. 6. Skin/Wound Care: Routine pressure relief measure. Foam dressing to sacrum 7. Fluids/Electrolytes/Nutrition: Intake poor --does not like the food here.             Liberalize diet to regular for flavor.  Advised husband to bring supplements/food from home 8. MSSA bacteremia w/sepsis:  Zosyn changed to Ancef through 9/22 9. Diskitis: Likely Tricuspid valve as source given pulm infectious nodules , TTE no vegetation , no TEE done.             - Appreciate ID recs  10. COPD/Pulmonary septic emboli: continue Ancef-encourage pulmonary toilet with flutter valve             --continue Incurse inhaler daily             Wean supplemental oxygen as tolerated  Improving department, not needing this a.m. 11. Gout flare: start prednisone taper down to  '15mg'$  daily on 9/12, monitor for recurrent toe/foot pain 12.. Hypokalemia:  . Added supplement as Lasix on board. Potassium 4.2 on 9/12 Supplementing 13. Neurogenic bladder v/s overflow: retention resolved  14.  Loose stools w/incontinence/Neurogenic bowel?: Goes after she eats as well as prn.             --discontinued Miralax --KUB only with gas -no stools recorded yet -continue daily fiber 15. Obesity BMI 30.20: provide dietary counseling.  16. Leukocytosis, compounded by steroids   Urine culture suggesting multiple species  WBCs  15.4 on 9/12   Afebrile, no signs/symptoms of infection  LOS: 14 days A FACE TO FACE EVALUATION WAS PERFORMED  Jatin Naumann Lorie Phenix 07/24/2021, 12:13 PM

## 2021-07-24 NOTE — Progress Notes (Addendum)
Occupational Therapy Session Note  Patient Details  Name: Samantha King MRN: [id number] Date of Birth: 1946/09/29  Today's Date: 07/24/2021 OT Individual Time: 0233-4356 OT Individual Time Calculation (min): 75 min    Short Term Goals: Week 2:  OT Short Term Goal 1 (Week 2): Pt will don pants with reacher with min A, OT Short Term Goal 2 (Week 2): Pt will sit to stand from toilet with S to RW. OT Short Term Goal 3 (Week 2): Pt will self cleanse post toileting with mod A. OT Short Term Goal 4 (Week 2): Pt will tolerate standing at sink to be able to brush her teeth.  Skilled Therapeutic Interventions/Progress Updates:    Pt received in wc agreeable to therapy.  She practiced ambulating in and out of bathroom with RW with light CGA, S with all sit to stands today, clothing management, cleansing.  She was able to tolerate standing at sink for 3-4 min to brush teeth and wash hands.  Sat in wc to practice using a sock aid with set up and cues and a plastic shoe horn. The plastic one was too flimsy so will have her try the metal one tomorrow.   She worked on standing endurance with leg exercises of heel raises, knee flexion with hip flexion, hamstring curls, hip extension with glute squeeze. Pt could tolerate exercises for 3-4 min at a time then needed seated rest breaks.   Seated arm exercises and glute squeezes for pelvic strength.   She tolerated room air and her sats were 92-94% even with standing exercises.  Pt resting in wc with all needs met. Belt alarm on.  Therapy Documentation Precautions:  Precautions Precautions: Fall Restrictions Weight Bearing Restrictions: No    Vital Signs: Therapy Vitals Temp: 98.2 F (36.8 C) Temp Source: Oral Pulse Rate: 67 Resp: 16 BP: 116/64 Patient Position (if appropriate): Lying Oxygen Therapy SpO2: 98 % O2 Device: Nasal Cannula O2 Flow Rate (L/min): 1 L/min Pain: No pain at rest, 7/10 occasionally with  movement  ADL: ADL Eating: Set up Grooming: Setup Where Assessed-Grooming: Sitting at sink Upper Body Bathing: Supervision/safety Where Assessed-Upper Body Bathing: Shower Lower Body Bathing: Contact guard Where Assessed-Lower Body Bathing: Shower Upper Body Dressing: Setup Lower Body Dressing: Minimal assistance Toileting: Contact guard Where Assessed-Toileting: Glass blower/designer: Therapist, music Method: Counselling psychologist: Energy manager: Curator Method: Heritage manager: Grab bars, Transfer tub bench  Therapy/Group: Individual Therapy  [place] 07/24/2021, 8:47 AM

## 2021-07-24 NOTE — Progress Notes (Signed)
Physical Therapy Session Note  Patient Details  Name: Samantha King MRN: [id number] Date of Birth: 08-26-1946  Today's Date: 07/24/2021 PT Individual Time: 0805-0900 PT Individual Time Calculation (min): 55 min   Short Term Goals: Week 2:  PT Short Term Goal 1 (Week 2): Pt will ambulate 25' w/LRAD and min A PT Short Term Goal 2 (Week 2): Pt will perform sit <>stands from WC w/LRAD and CGA consistently PT Short Term Goal 3 (Week 2): Pt will participate in balance assessment w/LRAD for improved activity tolerance  Skilled Therapeutic Interventions/Progress Updates: Pt presented in bed with nsg present agreeable to therapy. Pt recently received pain meds and requesting to use bathroom with urgency. Pt performed supine to sit with supervision and performed stand pivot to w/c with RW and supervision. PTA transported pt to toilet and performed toilet transfer with wall rail and supervision. Pt was supervision for clothing management (+urinary void and BM). Performed front peri-care independently but required minA for posterior peri-care and to ensure cleaniness. Pt then returned to w/c in same manner as prior and PTA threaded pants total A for time management. Pt performed STS with supervision to pull pants over hips. Pt then transported to sink and performed hand hygiene and changed top with set up. Pt then transported to day room and performed stand pivot to high/low mat. Performed STS x 6 from mat with supervision and cues for abdominal bracing to assist lumbar stabilization and control pain. Pt was happy with suggestion and stated did help. Pt then participated in ambulation 52f x 2 with RW and CGA fading to close  for endurance. Pt destat to 89% on 1L supplemental O2 but was able to recover quickly and provided cues for diaphragmatic breathing. Returned to w/c at end of session via stand pivot and transported back to room for time management. Pt left in w/c at end of session with belt alarm on,  call bell within reach and needs met.      Therapy Documentation Precautions:  Precautions Precautions: Fall Restrictions Weight Bearing Restrictions: No General:   Vital Signs:   Pain: Pain Assessment Pain Score: 0-No pain    Therapy/Group: Individual Therapy  Hurshell Dino Cloie Wooden, PTA ' 07/24/2021, 12:51 PM

## 2021-07-25 DIAGNOSIS — R7881 Bacteremia: Secondary | ICD-10-CM | POA: Diagnosis not present

## 2021-07-25 DIAGNOSIS — R5381 Other malaise: Secondary | ICD-10-CM | POA: Diagnosis not present

## 2021-07-25 DIAGNOSIS — R739 Hyperglycemia, unspecified: Secondary | ICD-10-CM

## 2021-07-25 DIAGNOSIS — L539 Erythematous condition, unspecified: Secondary | ICD-10-CM | POA: Diagnosis not present

## 2021-07-25 DIAGNOSIS — D72829 Elevated white blood cell count, unspecified: Secondary | ICD-10-CM | POA: Diagnosis not present

## 2021-07-25 LAB — GLUCOSE, CAPILLARY
Glucose-Capillary: 107 mg/dL — ABNORMAL HIGH (ref 70–99)
Glucose-Capillary: 93 mg/dL (ref 70–99)
Glucose-Capillary: 125 mg/dL — ABNORMAL HIGH (ref 70–99)
Glucose-Capillary: 121 mg/dL — ABNORMAL HIGH (ref 70–99)

## 2021-07-25 NOTE — Progress Notes (Signed)
Occupational Therapy Session Note  Patient Details  Name: Samantha King MRN: [id number] Date of Birth: 1946-01-12  Today's Date: 07/25/2021 OT Individual Time: [id number] OT Individual Time Calculation (min): 71 min    Short Term Goals: Week 2:  OT Short Term Goal 1 (Week 2): Pt will don pants with reacher with min A, OT Short Term Goal 2 (Week 2): Pt will sit to stand from toilet with S to RW. OT Short Term Goal 3 (Week 2): Pt will self cleanse post toileting with mod A. OT Short Term Goal 4 (Week 2): Pt will tolerate standing at sink to be able to brush her teeth.  Skilled Therapeutic Interventions/Progress Updates:  Pt greeted returning from BR with NT, agreeable to OT intervention. Pt request to work on seated grooming tasks, pt able to complete seated grooming tasks including curling hair and applying make up from w/c level with set- up assist. Education provided during activity on energy conservation strategies ( ECS) such as breaking up tasks into small steps or prioritizing which grooming task is most important on the days pt feels most fatigued. Pt transported to kitchen to work on IADL tasks with total A for time mgmt. Pt able to reach [facility] into cabinets to retrieve plates, in drawers to retrieve silverware and in refrigerator to retrieve water, of note pt did almost drop plates, recommended lighter plates for home or using paper plates as needed. Pt also noted to nearly drop water bottle, recommend pt put water in door of refrigerator to allow easier access. Pt additionally able to remove plates from low dishwasher and place plates on counter. Pt reports urgency to void bowels, supervision for ambulatory toilet transfer with RW, MOD A for toileting this session d/t cleanliness and urgency. Pt returned to room with total A where pt left up in w/c with RN present and safety belt activated.                   Therapy Documentation Precautions:  Precautions Precautions:  Fall Restrictions Weight Bearing Restrictions: No  Pain: pt reports no pain during session    Therapy/Group: Individual Therapy  Corinne Ports [facility] 07/25/2021, 3:30 PM

## 2021-07-25 NOTE — Progress Notes (Signed)
PROGRESS NOTE   Subjective/Complaints: Patient seen sitting up in her chair this morning.  She states she slept well overnight.  She states she is no longer requiring supplemental oxygen.    ROS: Denies CP, SOB, N/V/D  Objective:   No results found. No results for input(s): WBC, HGB, HCT, PLT in the last 72 hours.   No results for input(s): NA, K, CL, CO2, GLUCOSE, BUN, CREATININE, CALCIUM in the last 72 hours.     Intake/Output Summary (Last 24 hours) at 07/25/2021 1034 Last data filed at 07/25/2021 0746 Gross per 24 hour  Intake 440 ml  Output --  Net 440 ml         Physical Exam: Vital Signs Blood pressure (!) 144/87, pulse 72, temperature 97.8 F (36.6 C), temperature source Oral, resp. rate 18, height '5\' 3"'$  (1.6 m), weight 77.3 kg, SpO2 91 %. Constitutional: No distress . Vital signs reviewed. HENT: Normocephalic.  Atraumatic. Erythema and edema to left cheek. Eyes: EOMI. No discharge. Cardiovascular: No JVD.  RRR. Respiratory: Normal effort.  No stridor.  Bilateral clear to auscultation. GI: Non-distended.  BS +. Skin: Warm and dry.  Intact. Psych: Normal mood.  Normal behavior. Musc: No edema in extremities.  No tenderness in extremities. Neuro: Alert and oriented Motor 5/5 UE and 4-4+/5 LE's, stable  Assessment/Plan: 1. Functional deficits which require 3+ hours per day of interdisciplinary therapy in a comprehensive inpatient rehab setting. Physiatrist is providing close team supervision and 24 hour management of active medical problems listed below. Physiatrist and rehab team continue to assess barriers to discharge/monitor patient progress toward functional and medical goals  Care Tool:  Bathing    Body parts bathed by patient: Right arm, Left arm, Chest, Abdomen, Front perineal area, Face, Right upper leg, Left upper leg, Buttocks, Right lower leg, Left lower leg   Body parts bathed by helper:  Right lower leg, Left lower leg, Buttocks, Face     Bathing assist Assist Level: Contact Guard/Touching assist     Upper Body Dressing/Undressing Upper body dressing   What is the patient wearing?: Pull over shirt    Upper body assist Assist Level: Set up assist    Lower Body Dressing/Undressing Lower body dressing      What is the patient wearing?: Underwear/pull up, Pants     Lower body assist Assist for lower body dressing: Contact Guard/Touching assist     Toileting Toileting    Toileting assist Assist for toileting: Contact Guard/Touching assist     Transfers Chair/bed transfer  Transfers assist  Chair/bed transfer activity did not occur: Safety/medical concerns  Chair/bed transfer assist level: Supervision/Verbal cueing     Locomotion Ambulation   Ambulation assist   Ambulation activity did not occur: Safety/medical concerns  Assist level: Supervision/Verbal cueing Assistive device: Walker-rolling Max distance: 62f   Walk 10 feet activity   Assist  Walk 10 feet activity did not occur: Safety/medical concerns  Assist level: Supervision/Verbal cueing Assistive device: Walker-rolling   Walk 50 feet activity   Assist Walk 50 feet with 2 turns activity did not occur: Safety/medical concerns  Assist level: Supervision/Verbal cueing Assistive device: Walker-rolling    Walk 150 feet  activity   Assist Walk 150 feet activity did not occur: Safety/medical concerns         Walk 10 feet on uneven surface  activity   Assist Walk 10 feet on uneven surfaces activity did not occur: Safety/medical concerns         Wheelchair     Assist Is the patient using a wheelchair?: Yes Type of Wheelchair: Manual Wheelchair activity did not occur: Safety/medical concerns  Wheelchair assist level: Set up assist, Supervision/Verbal cueing Max wheelchair distance: >150'    Wheelchair 50 feet with 2 turns activity    Assist    Wheelchair  50 feet with 2 turns activity did not occur: Safety/medical concerns   Assist Level: Supervision/Verbal cueing   Wheelchair 150 feet activity     Assist  Wheelchair 150 feet activity did not occur: Safety/medical concerns   Assist Level: Supervision/Verbal cueing    Medical Problem List and Plan: 1.  Deficits with mobility, endurance, self-care secondary to debility from L3-4 osteomyelitis/discitis/septic shock/septic emboli/presumed endocarditis Also with L4-5 lumbar stenosis - severe   Continue CIR 2.  Antithrombotics: -DVT/anticoagulation:  Pharmaceutical: Lovenox             -antiplatelet therapy: Plavix 3. Left buttock pain/back pain- : Oxycodone prn             Continue Cymbalta and Gabapentin.              Monitor with increased exertion  increased cymbalta to 60 mg daily  Robaxin to 1000 mg q6 hours prn, decreased to 750 on 9/8.  Added voltaren gel QID for legs Controlled on 9/15 4. Mood: LCSW to follow for evaluation and support.   Started Buspar 5 mg TID for anxiety on 9/6.              -antipsychotic agents: N/a 5. Neuropsych: This patient is capable of making decisions on her own behalf. 6. Skin/Wound Care: Routine pressure relief measure. Foam dressing to sacrum 7. Fluids/Electrolytes/Nutrition: Intake poor --does not like the food here.             Liberalize diet to regular for flavor.  Advised husband to bring supplements/food from home 8. MSSA bacteremia w/sepsis:  Zosyn changed to Ancef through 9/22 9. Diskitis: Likely Tricuspid valve as source given pulm infectious nodules , TTE no vegetation , no TEE done.             - Appreciate ID recs  10. COPD/Pulmonary septic emboli: continue Ancef-encourage pulmonary toilet with flutter valve             --continue Incurse inhaler daily             Wean supplemental oxygen as tolerated  Appears she has weaned off on 9/15 11. Gout flare: start prednisone taper down to '15mg'$  daily on 9/12, monitor for recurrent  toe/foot pain 12.. Hypokalemia:  . Added supplement as Lasix on board. Potassium 4.2 on 9/12 Supplementing 13. Neurogenic bladder v/s overflow: retention resolved  14.  Loose stools w/incontinence/Neurogenic bowel?: Goes after she eats as well as prn.             --discontinued Miralax --KUB only with gas -no stools recorded yet -continue daily fiber 15. Obesity BMI 30.20: provide dietary counseling.  16. Leukocytosis, confounded by steroids   Urine culture suggesting multiple species  WBCs 15.4 on 9/12   Afebrile, no signs/symptoms of infection 17.  Left cheek erythema/edema  Patient states this is a intermittent and  sporadic problem that has been happening for >50 years, where her left cheek becomes erythematous and edematous and spontaneously resolves and then recurs a few days later in the cycle  Encourage diary 18.  Hyperglycemia  Monitor with steroids  LOS: 15 days A FACE TO FACE EVALUATION WAS PERFORMED  Mostyn Varnell Lorie Phenix 07/25/2021, 10:34 AM

## 2021-07-25 NOTE — Progress Notes (Signed)
Physical Therapy Session Note  Patient Details  Name: Samantha King MRN: [id number] Date of Birth: Jun 29, 1946  Today's Date: 07/25/2021 PT Individual Time: [id number] PT Individual Time Calculation (min): 45 min   Short Term Goals: Week 2:  PT Short Term Goal 1 (Week 2): Pt will ambulate 25' w/LRAD and min A PT Short Term Goal 2 (Week 2): Pt will perform sit <>stands from WC w/LRAD and CGA consistently PT Short Term Goal 3 (Week 2): Pt will participate in balance assessment w/LRAD for improved activity tolerance   Skilled Therapeutic Interventions/Progress Updates:     Pt supine in bed on arrival with RN at bedside for morning medications. Pt agreeable to therapy - reports low back pain which RN provided lidocane patch and voltaren gel for. Pt on room air during session. Pt completed supine<>sit with supervision and use of bed features. Took medications while sitting up and then completed sit<>stand to RW with supervision. She requested to brush teeth at the sink so she completed ambulatory transfer with RW and supervision - able to brush teeth without assist while standing with supervision. Pt then transported to main rehab gym for time. Worked on gait training around rehab gym - ambulated ~118f with RW and supervision - cues only needed for relaxing/depressing shoulders, paced breathing, and upright posture. Seated rest break needed and then worked on sIT trainer She ambulated up/down 4 + 4 steps (seated rest) with CGA. Used 2 hand rails for first attempt and then 1 hand rail on R for 2nd attempt to simulate home environment. Pt then reporting need to use restroom - returned to her room in w/c for time - ambulatory transfer with CGA and RW into the bathroom and needed assistance for managing lower body clothes in standing. She was continent of bladder and able to complete pericare without assist. Ambulated back to her w/c with CGA and RW - concluded session in w/c with safety belt alarm  on and all needs within reach.  Therapy Documentation Precautions:  Precautions Precautions: Fall Restrictions Weight Bearing Restrictions: No General:    Therapy/Group: Individual Therapy  CAlger Simons9/15/2022, 7:43 AM

## 2021-07-25 NOTE — Progress Notes (Signed)
Physical Therapy Weekly Progress Note  Patient Details  Name: Samantha King MRN: [id number] Date of Birth: 1946/02/03  Beginning of progress report period: July 11, 2021 End of progress report period: July 25, 2021  Today's Date: 07/25/2021 PT Individual Time: 1100-1157; 0102-7253 PT Individual Time Calculation (min): 57 min and 25 min  Patient has met 3 of 3 short term goals. Pt's long term goals have been upgraded due to improved performance in CIR. Pt is performing transfers with supervision and RW and is ambulating up to 100' without increase in low back pain. Pt's husband and son have been present for multiple sessions, will schedule family training prior to DC next week.   Patient continues to demonstrate the following deficits muscle weakness and decreased balance strategies and low back pain  and therefore will continue to benefit from skilled PT intervention to increase functional independence with mobility.  Patient progressing toward long term goals..  Continue plan of care.  PT Short Term Goals Week 1:  PT Short Term Goal 1 (Week 1): Pt will perform supine <>sit w/min A PT Short Term Goal 1 - Progress (Week 1): Met PT Short Term Goal 2 (Week 1): Pt will perform sit <>stand w/LRAD and mod A PT Short Term Goal 2 - Progress (Week 1): Met PT Short Term Goal 3 (Week 1): Pt will perform bed <>chair transfers mod A w/LRAD PT Short Term Goal 3 - Progress (Week 1): Met PT Short Term Goal 4 (Week 1): Pt will maintain dynamic sitting balance w/no UE support for 2 minutes w/min A PT Short Term Goal 4 - Progress (Week 1): Met Week 2:  PT Short Term Goal 1 (Week 2): Pt will ambulate 25' w/LRAD and min A PT Short Term Goal 1 - Progress (Week 2): Met PT Short Term Goal 2 (Week 2): Pt will perform sit <>stands from WC w/LRAD and CGA consistently PT Short Term Goal 2 - Progress (Week 2): Met PT Short Term Goal 3 (Week 2): Pt will participate in balance assessment w/LRAD for  improved activity tolerance PT Short Term Goal 3 - Progress (Week 2): Met Week 3:  PT Short Term Goal 1 (Week 3): STG = LTG due to LOS  Skilled Therapeutic Interventions/Progress Updates:  Session 1 Pt received sitting in WC in room, denied pain and was agreeable to PT. SpO2 at 92% on room air. Emphasis of session on dynamic gait training and improved activity tolerance. Pt transported to 1st floor outdoor patio w/total A due to time management. Sit <>stands from [facility] to RW throughout session w/supervision. Pt ambulated 90' and 115' w/RW and supervision, short seated rest break in between trials, SpO2 at 91%. Verbal cues for trunk extension, cervical extension, and maintaining safe distance to RW. Noted improved bilateral step clearance/length, cadence and breathing technique during ambulation. Pt reported urgency to void, transported back to room w/total A for time management. Pt completed toilet transfer w/RW and doffed pants w/supervision. Pt voided continently and performed peri care independently. Sit <>stand from toilet to RW and pt donned pants w/supervision. Pt ambulated 5' to sink, washed hands while standing w/supervision. Pt transported to equipment room w/total A to pick up rollator to trial during later session and self-propelled 200' back to room for improved activity tolerance w/supervision. Pt was left seated in WC in room, denied pain, all needs in reach.   Session 2 Pt received seated in WC in room, denied pain, on room air. Emphasis of session on gait practice with  rollator. Visual demonstration of rollator dynamics in room, pt able to teach back. SpO2 at 92%, sit <>stand from [facility] to rollator w/CGA and pt ambulated 150' w/rollator and supervision. Noted increased cadence and improved trunk extension compared to gait w/RW. Pt locked, turned and sat on rollator w/supervision. SpO2 at 90% on room air. Sit <>stand from rollator and turn w/CGA for steadying of rollator, pt ambulated 150' back to  room w/rollator and supervision. Stand <> to [facility] without locking rollator w/supervision, pt able to verbalize mistake without therapist input. Pt was left seated in WC in room, denied pain, all needs in reach.   Therapy Documentation Precautions:  Precautions Precautions: Fall Restrictions Weight Bearing Restrictions: No   Therapy/Group: Individual Therapy Cruzita Lederer , PT, DPT  07/25/2021, 7:44 AM

## 2021-07-26 DIAGNOSIS — R5381 Other malaise: Secondary | ICD-10-CM | POA: Diagnosis not present

## 2021-07-26 DIAGNOSIS — R739 Hyperglycemia, unspecified: Secondary | ICD-10-CM | POA: Diagnosis not present

## 2021-07-26 DIAGNOSIS — D72829 Elevated white blood cell count, unspecified: Secondary | ICD-10-CM | POA: Diagnosis not present

## 2021-07-26 DIAGNOSIS — L539 Erythematous condition, unspecified: Secondary | ICD-10-CM | POA: Diagnosis not present

## 2021-07-26 LAB — GLUCOSE, CAPILLARY
Glucose-Capillary: 109 mg/dL — ABNORMAL HIGH (ref 70–99)
Glucose-Capillary: 107 mg/dL — ABNORMAL HIGH (ref 70–99)
Glucose-Capillary: 143 mg/dL — ABNORMAL HIGH (ref 70–99)
Glucose-Capillary: 176 mg/dL — ABNORMAL HIGH (ref 70–99)

## 2021-07-26 MED ORDER — PREDNISONE 5 MG PO TABS
10.0000 mg | ORAL_TABLET | Freq: Every day | ORAL | Status: DC
  Administered 2021-07-27 – 2021-07-29 (×3): 10 mg via ORAL
  Filled 2021-07-26 (×3): qty 2

## 2021-07-26 NOTE — Progress Notes (Signed)
Recreational Therapy Session Note  Patient Details  Name: Samantha King MRN: [id number] Date of Birth: 12/11/1945 Today's Date: 07/26/2021 Session deferred as pt's husband here for family training. Therapy/Group: Co-Treatment  Dorinda Stehr 07/26/2021, 8:37 AM

## 2021-07-26 NOTE — Progress Notes (Signed)
PROGRESS NOTE   Subjective/Complaints: Patient seen sitting up in bed this morning.  She states she slept well overnight.  She has questions regarding the physicians involved in her care and overall comprehensive management.  ROS: Denies CP, SOB, N/V/D  Objective:   No results found. No results for input(s): WBC, HGB, HCT, PLT in the last 72 hours.   No results for input(s): NA, K, CL, CO2, GLUCOSE, BUN, CREATININE, CALCIUM in the last 72 hours.     Intake/Output Summary (Last 24 hours) at 07/26/2021 1107 Last data filed at 07/26/2021 0812 Gross per 24 hour  Intake 680 ml  Output --  Net 680 ml         Physical Exam: Vital Signs Blood pressure 112/77, pulse 70, temperature 98.6 F (37 C), resp. rate 18, height '5\' 3"'$  (1.6 m), weight 77.3 kg, SpO2 93 %. Constitutional: No distress . Vital signs reviewed. HENT: Normocephalic.  Atraumatic. Erythema to left cheek improving Eyes: EOMI. No discharge. Cardiovascular: No JVD.  RRR. Respiratory: Normal effort.  No stridor.  Bilateral clear to auscultation. GI: Non-distended.  BS +. Skin: Warm and dry.  Intact. Psych: Normal mood.  Normal behavior. Musc: No edema in extremities.  No tenderness in extremities. Neuro: Alert and oriented Motor 5/5 UE and 4-4+/5 LE's, unchanged  Assessment/Plan: 1. Functional deficits which require 3+ hours per day of interdisciplinary therapy in a comprehensive inpatient rehab setting. Physiatrist is providing close team supervision and 24 hour management of active medical problems listed below. Physiatrist and rehab team continue to assess barriers to discharge/monitor patient progress toward functional and medical goals  Care Tool:  Bathing    Body parts bathed by patient: Right arm, Left arm, Chest, Abdomen, Front perineal area, Face, Right upper leg, Left upper leg, Buttocks, Right lower leg, Left lower leg   Body parts bathed by  helper: Right lower leg, Left lower leg, Buttocks, Face     Bathing assist Assist Level: Contact Guard/Touching assist     Upper Body Dressing/Undressing Upper body dressing   What is the patient wearing?: Pull over shirt    Upper body assist Assist Level: Set up assist    Lower Body Dressing/Undressing Lower body dressing      What is the patient wearing?: Underwear/pull up, Pants     Lower body assist Assist for lower body dressing: Contact Guard/Touching assist     Toileting Toileting    Toileting assist Assist for toileting: Moderate Assistance - Patient 50 - 74%     Transfers Chair/bed transfer  Transfers assist  Chair/bed transfer activity did not occur: Safety/medical concerns  Chair/bed transfer assist level: Supervision/Verbal cueing     Locomotion Ambulation   Ambulation assist   Ambulation activity did not occur: Safety/medical concerns  Assist level: Supervision/Verbal cueing Assistive device: Rollator Max distance: 150'   Walk 10 feet activity   Assist  Walk 10 feet activity did not occur: Safety/medical concerns  Assist level: Supervision/Verbal cueing Assistive device: Rollator   Walk 50 feet activity   Assist Walk 50 feet with 2 turns activity did not occur: Safety/medical concerns  Assist level: Supervision/Verbal cueing Assistive device: Rollator    Walk 150  feet activity   Assist Walk 150 feet activity did not occur: Safety/medical concerns  Assist level: Supervision/Verbal cueing Assistive device: Rollator    Walk 10 feet on uneven surface  activity   Assist Walk 10 feet on uneven surfaces activity did not occur: Safety/medical concerns   Assist level: Supervision/Verbal cueing Assistive device: Walker-rolling   Wheelchair     Assist Is the patient using a wheelchair?: Yes Type of Wheelchair: Manual Wheelchair activity did not occur: Safety/medical concerns  Wheelchair assist level: Set up assist,  Supervision/Verbal cueing Max wheelchair distance: >150'    Wheelchair 50 feet with 2 turns activity    Assist    Wheelchair 50 feet with 2 turns activity did not occur: Safety/medical concerns   Assist Level: Supervision/Verbal cueing   Wheelchair 150 feet activity     Assist  Wheelchair 150 feet activity did not occur: Safety/medical concerns   Assist Level: Supervision/Verbal cueing    Medical Problem List and Plan: 1.  Deficits with mobility, endurance, self-care secondary to debility from L3-4 osteomyelitis/discitis/septic shock/septic emboli/presumed endocarditis Also with L4-5 lumbar stenosis - severe   Continue CIR 2.  Antithrombotics: -DVT/anticoagulation:  Pharmaceutical: Lovenox             -antiplatelet therapy: Plavix 3. Left buttock pain/back pain- : Oxycodone prn             Continue Cymbalta and Gabapentin.              Monitor with increased exertion  increased cymbalta to 60 mg daily  Robaxin to 1000 mg q6 hours prn, decreased to 750 on 9/8.  Added voltaren gel QID for legs Controlled on 9/16 4. Mood: LCSW to follow for evaluation and support.   Started Buspar 5 mg TID for anxiety on 9/6.              -antipsychotic agents: N/a 5. Neuropsych: This patient is capable of making decisions on her own behalf. 6. Skin/Wound Care: Routine pressure relief measure. Foam dressing to sacrum 7. Fluids/Electrolytes/Nutrition: Intake poor --does not like the food here.             Liberalize diet to regular for flavor.  Advised husband to bring supplements/food from home 8. MSSA bacteremia w/sepsis:  Zosyn changed to Ancef through 9/22 9. Diskitis: Likely Tricuspid valve as source given pulm infectious nodules , TTE no vegetation , no TEE done.             - Appreciate ID recs  10. COPD/Pulmonary septic emboli: continue Ancef-encourage pulmonary toilet with flutter valve             --continue Incurse inhaler daily             Wean supplemental oxygen as  tolerated, no longer requires 11. Gout flare: start prednisone taper down to '15mg'$  daily on 9/12, decreased to 10 on 9/17 12.. Hypokalemia:  . Added supplement as Lasix on board. Potassium 4.2 on 9/12, labs ordered for Monday Supplementing 13. Neurogenic bladder v/s overflow: retention resolved  14.  Loose stools w/incontinence/Neurogenic bowel?: Goes after she eats as well as prn.             --discontinued Miralax --KUB only with gas -no stools recorded yet -continue daily fiber 15. Obesity BMI 30.20: provide dietary counseling.  16. Leukocytosis, confounded by steroids   Urine culture suggesting multiple species  WBCs 15.4 on 9/12, labs ordered for Monday  Afebrile, no signs/symptoms of infection 17.  Left cheek  erythema/edema  Patient states this is a intermittent and sporadic problem that has been happening for >50 years, where her left cheek becomes erythematous and edematous and spontaneously resolves and then recurs a few days later in the cycle  Encourage diary 18.  Hyperglycemia  Monitor with steroid wean  LOS: 16 days A FACE TO FACE EVALUATION WAS PERFORMED  Ikram Riebe Lorie Phenix 07/26/2021, 11:07 AM

## 2021-07-26 NOTE — Progress Notes (Signed)
Occupational Therapy Weekly Progress Note  Patient Details  Name: Samantha King MRN: [id number] Date of Birth: 12/23/1945  Beginning of progress report period: July 18, 2021 End of progress report period: July 26, 2021  Today's Date: 07/26/2021 OT Individual Time: 0910-1020 OT Individual Time Calculation (min): 70 min    Patient has met 4 of 4 short term goals.  Pt has continued to make excellent progress with her mobility and endurance which increases her ability to be more independent with self care.  Pt has supervision goals.   Patient continues to demonstrate the following deficits: muscle weakness, decreased cardiorespiratoy endurance, and decreased standing balance and decreased balance strategies and therefore will continue to benefit from skilled OT intervention to enhance overall performance with BADL and iADL.  Patient progressing toward long term goals..  Continue plan of care.  OT Short Term Goals Week 1:  OT Short Term Goal 1 (Week 1): Pt will tolerate sitting EOB for 10 minutes within functional BADL task OT Short Term Goal 1 - Progress (Week 1): Met OT Short Term Goal 2 (Week 1): Patient will complete BSC transfer with max A +2 OT Short Term Goal 2 - Progress (Week 1): Met OT Short Term Goal 3 (Week 1): Pt will complete 1 step of LB dressing task OT Short Term Goal 3 - Progress (Week 1): Met Week 2:  OT Short Term Goal 1 (Week 2): Pt will don pants with reacher with min A, OT Short Term Goal 1 - Progress (Week 2): Met OT Short Term Goal 2 (Week 2): Pt will sit to stand from toilet with S to RW. OT Short Term Goal 2 - Progress (Week 2): Met OT Short Term Goal 3 (Week 2): Pt will self cleanse post toileting with mod A. OT Short Term Goal 3 - Progress (Week 2): Met OT Short Term Goal 4 (Week 2): Pt will tolerate standing at sink to be able to brush her teeth. OT Short Term Goal 4 - Progress (Week 2): Met Week 3:  OT Short Term Goal 1 (Week 3): STGs =  LTGs  Skilled Therapeutic Interventions/Progress Updates:    Pt received in bed requesting to toilet. Bed placed flat so pt would have to practice getting out of bed as she would at home. She continues to use bed rail so will remove that next time, but she did sit up on her own. Used RW to getting into bathroom with light CGA during ambulation then did transfers and sit to stand, clothing management and cleansing all with S.  She then sat and rested but encouraged her to stand at sink to brush teeth. Reviewed and practiced safe standing position to take pressure off her back.  Sat in regular arm chair to cross ankle over knee to doff slipper socks and then don hose and shoes independently.  Was able to do both feet.   Pt transferred back to wc to go to ADL apt to practice simulated walk in shower transfers. Used both a step back method and a side step method.  Pt will need grabbars and very likely a shower seat with handles.  Encouraged her to practice with [place] first.  Discussed equipment options.  Pt returned to room and set up at sink so she could do her hair and makeup.  Resting in wc with all needs met.  Belt alarm off but door open and pt's next therapist was coming shortly.  Therapy Documentation Precautions:  Precautions Precautions: Fall Restrictions Weight  Bearing Restrictions: No    Vital Signs: Oxygen Therapy SpO2: 93 % O2 Device: Room Air Pain: No pain at rest only with certain movements in standing ADL: ADL Eating: Independent Grooming: Supervision/safety Where Assessed-Grooming: Standing at sink Upper Body Bathing: Supervision/safety Where Assessed-Upper Body Bathing: Shower Lower Body Bathing: Contact guard Where Assessed-Lower Body Bathing: Shower Upper Body Dressing: Setup Lower Body Dressing: Supervision/safety Where Assessed-Lower Body Dressing: Chair Toileting: Supervision/safety Where Assessed-Toileting: Glass blower/designer: Therapist, music  Method: Counselling psychologist: Energy manager: Curator Method: Heritage manager: Grab bars, Transfer tub bench   Therapy/Group: Individual Therapy  Nelson 07/26/2021, 9:43 AM

## 2021-07-26 NOTE — Progress Notes (Signed)
Physical Therapy Session Note  Patient Details  Name: Samantha King MRN: [id number] Date of Birth: 13-Dec-1945  Today's Date: 07/26/2021 PT Individual Time: 1100-1158; 9935-7017 PT Individual Time Calculation (min): 58 min and 73 min  Short Term Goals: Week 1:  PT Short Term Goal 1 (Week 1): Pt will perform supine <>sit w/min A PT Short Term Goal 1 - Progress (Week 1): Met PT Short Term Goal 2 (Week 1): Pt will perform sit <>stand w/LRAD and mod A PT Short Term Goal 2 - Progress (Week 1): Met PT Short Term Goal 3 (Week 1): Pt will perform bed <>chair transfers mod A w/LRAD PT Short Term Goal 3 - Progress (Week 1): Met PT Short Term Goal 4 (Week 1): Pt will maintain dynamic sitting balance w/no UE support for 2 minutes w/min A PT Short Term Goal 4 - Progress (Week 1): Met Week 2:  PT Short Term Goal 1 (Week 2): Pt will ambulate 25' w/LRAD and min A PT Short Term Goal 1 - Progress (Week 2): Met PT Short Term Goal 2 (Week 2): Pt will perform sit <>stands from WC w/LRAD and CGA consistently PT Short Term Goal 2 - Progress (Week 2): Met PT Short Term Goal 3 (Week 2): Pt will participate in balance assessment w/LRAD for improved activity tolerance PT Short Term Goal 3 - Progress (Week 2): Met Week 3:  PT Short Term Goal 1 (Week 3): STG = LTG due to LOS  Skilled Therapeutic Interventions/Progress Updates:  Session 1 Pt received seated in WC in room, husband present for family training. Pt reported pain as 5/10 in low back, was premedicated and agreeable to PT. Sit <>stands from [facility] and rollator throughout session w/S*. Pt ambulated 10' to [facility] w/rollator and doffed pants while standing w/S*. Pt voided continently and performed peri care independently. Sit <>stand from [facility] to rollator and pt ambulated >200' w/S* and one seated rest break. Pt ascended/descended 8 6" steps w/step-to pattern and RUE support to mimic entrance to house. Educated husband on guarding and he practiced 4 steps w/pt and  therapist supervision. Pt ambulated 150' to ortho gym w/rollator and S* and performed car transfer w/set-up assist. Pt ambulated >200' w/rollator back to room w/one seated rest break and S*. Stand pivot from rollator to [facility] in room w/S* and pt was left seated in [facility], husband present, all needs in reach and denied pain.   Session 2 Pt received seated in WC, denied pain and was agreeable to PT. Pt transported to 1st floor outdoor patio w/total A for time management. Emphasis of session on dynamic gait on uneven terrain to prepare for DC home. Pt performed sit <>stands from rollator and WC throughout session w/S*. Pt ambulated >1,000' on uneven terrain w/rollator and S* w/seated rest breaks taken after every 200'. SpO2 at 92% throughout session without supplemental O2. Pt ambulated through gift shop w/rollator and practiced reaching for objects on shelves for dynamic balance practice without UE support w/S*. Pt transported back to room w/total A due to fatigue and was left seated in WC in room, all needs in reach, denied pain.   Therapy Documentation Precautions:  Precautions Precautions: Fall Restrictions Weight Bearing Restrictions: No   Therapy/Group: Individual Therapy Cruzita Lederer Alaney Witter, PT, DPT  07/26/2021, 7:45 AM

## 2021-07-27 DIAGNOSIS — R7881 Bacteremia: Secondary | ICD-10-CM | POA: Diagnosis not present

## 2021-07-27 DIAGNOSIS — R739 Hyperglycemia, unspecified: Secondary | ICD-10-CM | POA: Diagnosis not present

## 2021-07-27 DIAGNOSIS — R5381 Other malaise: Secondary | ICD-10-CM | POA: Diagnosis not present

## 2021-07-27 DIAGNOSIS — Z9981 Dependence on supplemental oxygen: Secondary | ICD-10-CM | POA: Diagnosis not present

## 2021-07-27 LAB — GLUCOSE, CAPILLARY
Glucose-Capillary: 107 mg/dL — ABNORMAL HIGH (ref 70–99)
Glucose-Capillary: 98 mg/dL (ref 70–99)
Glucose-Capillary: 178 mg/dL — ABNORMAL HIGH (ref 70–99)
Glucose-Capillary: 98 mg/dL (ref 70–99)

## 2021-07-27 MED ORDER — SODIUM CHLORIDE 0.9 % IV SOLN
INTRAVENOUS | Status: DC | PRN
  Administered 2021-07-27: 250 mL via INTRAVENOUS
  Administered 2021-07-31: 500 mL via INTRAVENOUS

## 2021-07-27 NOTE — Progress Notes (Signed)
PROGRESS NOTE   Subjective/Complaints: Patient seen sitting up in bed this morning.  She states she slept well overnight.  She denies complaints.  She is in good spirits.  ROS: Denies CP, SOB, N/V/D  Objective:   No results found. No results for input(s): WBC, HGB, HCT, PLT in the last 72 hours.   No results for input(s): NA, K, CL, CO2, GLUCOSE, BUN, CREATININE, CALCIUM in the last 72 hours.     Intake/Output Summary (Last 24 hours) at 07/27/2021 0912 Last data filed at 07/27/2021 0718 Gross per 24 hour  Intake 720 ml  Output --  Net 720 ml         Physical Exam: Vital Signs Blood pressure 135/62, pulse 82, temperature 97.9 F (36.6 C), temperature source Oral, resp. rate 18, height '5\' 3"'$  (1.6 m), weight 77.3 kg, SpO2 94 %. Constitutional: No distress . Vital signs reviewed. HENT: Normocephalic.  Atraumatic. Eyes: EOMI. No discharge. Cardiovascular: No JVD.  RRR. Respiratory: Normal effort.  No stridor.  Bilateral clear to auscultation. GI: Non-distended.  BS +. Skin: Warm and dry.  Intact. Psych: Normal mood.  Normal behavior. Musc: No edema in extremities.  No tenderness in extremities. Neuro: Alert and oriented Motor 5/5 UE and 4-4+/5 LE's, stable  Assessment/Plan: 1. Functional deficits which require 3+ hours per day of interdisciplinary therapy in a comprehensive inpatient rehab setting. Physiatrist is providing close team supervision and 24 hour management of active medical problems listed below. Physiatrist and rehab team continue to assess barriers to discharge/monitor patient progress toward functional and medical goals  Care Tool:  Bathing    Body parts bathed by patient: Right arm, Left arm, Chest, Abdomen, Front perineal area, Face, Right upper leg, Left upper leg, Buttocks, Right lower leg, Left lower leg   Body parts bathed by helper: Right lower leg, Left lower leg, Buttocks, Face      Bathing assist Assist Level: Contact Guard/Touching assist     Upper Body Dressing/Undressing Upper body dressing   What is the patient wearing?: Pull over shirt    Upper body assist Assist Level: Set up assist    Lower Body Dressing/Undressing Lower body dressing      What is the patient wearing?: Underwear/pull up, Pants     Lower body assist Assist for lower body dressing: Contact Guard/Touching assist     Toileting Toileting    Toileting assist Assist for toileting: Moderate Assistance - Patient 50 - 74%     Transfers Chair/bed transfer  Transfers assist  Chair/bed transfer activity did not occur: Safety/medical concerns  Chair/bed transfer assist level: Supervision/Verbal cueing (Rollator)     Locomotion Ambulation   Ambulation assist   Ambulation activity did not occur: Safety/medical concerns  Assist level: Supervision/Verbal cueing Assistive device: Rollator Max distance: 150'   Walk 10 feet activity   Assist  Walk 10 feet activity did not occur: Safety/medical concerns  Assist level: Supervision/Verbal cueing Assistive device: Rollator   Walk 50 feet activity   Assist Walk 50 feet with 2 turns activity did not occur: Safety/medical concerns  Assist level: Supervision/Verbal cueing Assistive device: Rollator    Walk 150 feet activity   Assist Walk  150 feet activity did not occur: Safety/medical concerns  Assist level: Supervision/Verbal cueing Assistive device: Rollator    Walk 10 feet on uneven surface  activity   Assist Walk 10 feet on uneven surfaces activity did not occur: Safety/medical concerns   Assist level: Supervision/Verbal cueing Assistive device: Rollator   Wheelchair     Assist Is the patient using a wheelchair?: Yes Type of Wheelchair: Manual Wheelchair activity did not occur: Safety/medical concerns  Wheelchair assist level: Set up assist, Supervision/Verbal cueing Max wheelchair distance: >150'     Wheelchair 50 feet with 2 turns activity    Assist    Wheelchair 50 feet with 2 turns activity did not occur: Safety/medical concerns   Assist Level: Supervision/Verbal cueing   Wheelchair 150 feet activity     Assist  Wheelchair 150 feet activity did not occur: Safety/medical concerns   Assist Level: Supervision/Verbal cueing    Medical Problem List and Plan: 1.  Deficits with mobility, endurance, self-care secondary to debility from L3-4 osteomyelitis/discitis/septic shock/septic emboli/presumed endocarditis Also with L4-5 lumbar stenosis - severe   Continue CIR 2.  Antithrombotics: -DVT/anticoagulation:  Pharmaceutical: Lovenox             -antiplatelet therapy: Plavix 3. Left buttock pain/back pain- : Oxycodone prn             Continue Cymbalta and Gabapentin.              Monitor with increased exertion  increased cymbalta to 60 mg daily  Robaxin to 1000 mg q6 hours prn, decreased to 750 on 9/8.  Added voltaren gel QID for legs Controlled on 9/17 4. Mood: LCSW to follow for evaluation and support.   Started Buspar 5 mg TID for anxiety on 9/6.              -antipsychotic agents: N/a 5. Neuropsych: This patient is capable of making decisions on her own behalf. 6. Skin/Wound Care: Routine pressure relief measure. Foam dressing to sacrum 7. Fluids/Electrolytes/Nutrition: Intake poor --does not like the food here.             Liberalize diet to regular for flavor.  Advised husband to bring supplements/food from home 8. MSSA bacteremia w/sepsis:  Zosyn changed to Ancef through 9/22 9. Diskitis: Likely Tricuspid valve as source given pulm infectious nodules , TTE no vegetation , no TEE done.             - Appreciate ID recs  10. COPD/Pulmonary septic emboli: continue Ancef-encourage pulmonary toilet with flutter valve             --continue Incurse inhaler daily             Wean supplemental oxygen as tolerated, no longer requires 11. Gout flare: start  prednisone taper down to '15mg'$  daily on 9/12, decreased to 10 on 9/17 12.. Hypokalemia:  . Added supplement as Lasix on board. Potassium 4.2 on 9/12, labs ordered for Monday Supplementing 13. Neurogenic bladder v/s overflow: retention resolved  14.  Loose stools w/incontinence/Neurogenic bowel?: Goes after she eats as well as prn.             --discontinued Miralax --KUB only with gas -no stools recorded yet -continue daily fiber 15. Obesity BMI 30.20: provide dietary counseling.  16. Leukocytosis, confounded by steroids   Urine culture suggesting multiple species  WBCs 15.4 on 9/12, labs ordered for Monday  Afebrile, no signs/symptoms of infection 17.  Left cheek erythema/edema  Patient states this is  a intermittent and sporadic problem that has been happening for >50 years, where her left cheek becomes erythematous and edematous and spontaneously resolves and then recurs a few days later in the cycle  Encourage diary 18.  Hyperglycemia  Continue to monitor in accordance with steroid wean  LOS: 17 days A FACE TO FACE EVALUATION WAS PERFORMED  Clyde Upshaw Lorie Phenix 07/27/2021, 9:12 AM

## 2021-07-27 NOTE — Progress Notes (Signed)
Physical Therapy Session Note  Patient Details  Name: Samantha King MRN: [id number] Date of Birth: 02-04-1946  Today's Date: 07/27/2021 PT Individual Time: 0900-1000 PT Individual Time Calculation (min): 60 min   Short Term Goals: Week 1:  PT Short Term Goal 1 (Week 1): Pt will perform supine <>sit w/min A PT Short Term Goal 1 - Progress (Week 1): Met PT Short Term Goal 2 (Week 1): Pt will perform sit <>stand w/LRAD and mod A PT Short Term Goal 2 - Progress (Week 1): Met PT Short Term Goal 3 (Week 1): Pt will perform bed <>chair transfers mod A w/LRAD PT Short Term Goal 3 - Progress (Week 1): Met PT Short Term Goal 4 (Week 1): Pt will maintain dynamic sitting balance w/no UE support for 2 minutes w/min A PT Short Term Goal 4 - Progress (Week 1): Met Week 2:  PT Short Term Goal 1 (Week 2): Pt will ambulate 25' w/LRAD and min A PT Short Term Goal 1 - Progress (Week 2): Met PT Short Term Goal 2 (Week 2): Pt will perform sit <>stands from WC w/LRAD and CGA consistently PT Short Term Goal 2 - Progress (Week 2): Met PT Short Term Goal 3 (Week 2): Pt will participate in balance assessment w/LRAD for improved activity tolerance PT Short Term Goal 3 - Progress (Week 2): Met Week 3:  PT Short Term Goal 1 (Week 3): STG = LTG due to LOS  Skilled Therapeutic Interventions/Progress Updates:   Pain:  Pt reports 3-6 pain LLE, see below for assessment/rx.  Treatment to tolerance.  Rest breaks and repositioning as needed.  Pt initially oob in wc and stating she had develpoed RLE pain from hip to knee, new, "sore", lateral thigh.  Pt tender at TFL and length of IT band ending at instersion.  STM of ITB including CFM and scraping to tolerance results in increased sxs, followed by instruction w/ITB and piriformis stretching.  Pt reported 0/10 pain following stretching.  Gait 154f w/rollator, pt stated recurrence of pain but at lesser level 3/10.  Repeated stretching as above, pain  0/10. Demonstrated stretching technique for ITB and piriformis in supine. Gait [id number]/rollator w/supervision, 0/10 pain Gait 9042f/rollator w/supervision, 0/10 pain Reviewed stretching and instructed pt to perform if symptoms recurred and 2-3X day to address tightness. Pt left oob in wc w/alarm belt set and needs in reach    Therapy Documentation Precautions:  Precautions Precautions: Fall Restrictions Weight Bearing Restrictions: No     Therapy/Group: Individual Therapy BarNalee LightleT [place]17/2022, 12:45 PM

## 2021-07-27 NOTE — Progress Notes (Signed)
Occupational Therapy Session Note  Patient Details  Name: Samantha King MRN: [id number] Date of Birth: April 14, 1946  Today's Date: 07/27/2021 OT Individual Time: 1345-1457 OT Individual Time Calculation (min): 72 min    Short Term Goals: Week 2:  OT Short Term Goal 1 (Week 2): Pt will don pants with reacher with min A, OT Short Term Goal 1 - Progress (Week 2): Met OT Short Term Goal 2 (Week 2): Pt will sit to stand from toilet with S to RW. OT Short Term Goal 2 - Progress (Week 2): Met OT Short Term Goal 3 (Week 2): Pt will self cleanse post toileting with mod A. OT Short Term Goal 3 - Progress (Week 2): Met OT Short Term Goal 4 (Week 2): Pt will tolerate standing at sink to be able to brush her teeth. OT Short Term Goal 4 - Progress (Week 2): Met Week 3:  OT Short Term Goal 1 (Week 3): STGs = LTGs   Skilled Therapeutic Interventions/Progress Updates:    Pt greeted at time of session sitting up in wheelchair, no pain and agreeable to OT session. Pt had 1x back spasm while in gym which quickly resolved. Note pt hyperverbal throughout session needing extended time for tasks but very pleasant and motivated. Wheelchair transport > ADL apartment and focus on techniques using rollator for gathering plastic plates, cups, silverware, etc and transporting > table to set. Pt setting table seated and reviewed energy conservation techniques. Pt gathering 10/10 food items from fridge again using rollator with good hand placement on firm surface and sitting when appropriate to prevent back injury. Pt needed cuing 2x during kitchen activity for fully locking rollator brakes. Pt receptive to education on fall prevention, DME use, and energy conservation throughout IADL tasks. Discussion with pt as well regarding DC home and future plans to move to [facility], ensuing plan for accessible home environment. Pt having several questions regarding DME purchased already, reviewed with pt and confirmed these are  appropriate for shower seat, reacher, grab bars, etc. Pt wanting to practice stairs and performed 1 x 5 stairs up <> down with lateral approach w/ R hand rail only with CGA/close supervision. Back in room, nursing needing pt in bed for CHG bath, stand pivot with rollator CGA. Alarm on call bell in reach.    Therapy Documentation Precautions:  Precautions Precautions: Fall Restrictions Weight Bearing Restrictions: No     Therapy/Group: Individual Therapy  Viona Gilmore 07/27/2021, 12:33 PM

## 2021-07-28 LAB — GLUCOSE, CAPILLARY
Glucose-Capillary: 101 mg/dL — ABNORMAL HIGH (ref 70–99)
Glucose-Capillary: 111 mg/dL — ABNORMAL HIGH (ref 70–99)
Glucose-Capillary: 109 mg/dL — ABNORMAL HIGH (ref 70–99)
Glucose-Capillary: 107 mg/dL — ABNORMAL HIGH (ref 70–99)

## 2021-07-28 NOTE — Progress Notes (Signed)
Physical Therapy Session Note  Patient Details  Name: Samantha King MRN: [id number] Date of Birth: Mar 23, 1946  Today's Date: 07/28/2021 PT Individual Time: 1032-1120, [id number] PT Individual Time Calculation (min): 48, 66 min   Short Term Goals: Week 2:  PT Short Term Goal 1 (Week 2): Pt will ambulate 25' w/LRAD and min A PT Short Term Goal 1 - Progress (Week 2): Met PT Short Term Goal 2 (Week 2): Pt will perform sit <>stands from WC w/LRAD and CGA consistently PT Short Term Goal 2 - Progress (Week 2): Met PT Short Term Goal 3 (Week 2): Pt will participate in balance assessment w/LRAD for improved activity tolerance PT Short Term Goal 3 - Progress (Week 2): Met Week 3:  PT Short Term Goal 1 (Week 3): STG = LTG due to LOS Week 4:     Skilled Therapeutic Interventions/Progress Updates:  Tx 1:  Pt seated in wc.  She denied pain.  Gait training with rollator x 100' with close supervision.  No cues needed for locking brakes or safe use of rollator.  Balance retraining and sustained stretch bil heel cords, standing with forefeet on blue wedge with bil UE support, x 1 min, x 1 min, x 1 min with mini squats x 12.   Therapeutic exercises performed with trunk and LEs to increase strength for functional mobility.; seated- 12 x 1 bil scapular retraction standing- 10 x 1 each- heel/toe raises with bil UE support.  Sit> partial stand x 3 without use of UEs.   Discussed fitness for life regarding weight mgt, muscle mass/strengthening.  Pt very receptive.  HR 73, O2 sats 93% after ex, room air.  Gait x 100' with rollator to return to room, CGA.  At end of session, pt seated in wc with needs at hand.  Tx 2:  Pt in wc; she denied pain.  HR 73, O2 sats 93%, room air.   Toileting with close supervision for toilet transfer and clothing mgt. Continent of bladder.  Peri care independent.  Hand washing at sink from wc level, independent.  Gait training up/down 4 steps, R rail, step -to  ascending/descending, with bil hands on R rail, CGA.  Therapeutic exercises performed with LEs to increase strength for functional mobility. Backwards step ups R/L 7 x 2 onto 4" high steps, bil hands on rails. Standing with bil UE support, 15 x 1 R/L hip abduction, leading with heel.  Sit> partial stand without use of UEs, R/L lateral twist, x 5 for increased activation of core, quads and gluts.    Gait training with rollator to return to room, x 130' , rest on seat of rollator, x 70', CGA. At end of session, pt seated in wc with needs at hand.     Therapy Documentation Precautions:  Precautions Precautions: Fall Restrictions Weight Bearing Restrictions: No      Therapy/Group: Individual Therapy  Keshawn Sundberg 07/28/2021, 4:45 PM

## 2021-07-29 DIAGNOSIS — D72829 Elevated white blood cell count, unspecified: Secondary | ICD-10-CM | POA: Diagnosis not present

## 2021-07-29 DIAGNOSIS — B9561 Methicillin susceptible Staphylococcus aureus infection as the cause of diseases classified elsewhere: Secondary | ICD-10-CM | POA: Diagnosis not present

## 2021-07-29 DIAGNOSIS — R7881 Bacteremia: Secondary | ICD-10-CM | POA: Diagnosis not present

## 2021-07-29 DIAGNOSIS — R5381 Other malaise: Secondary | ICD-10-CM | POA: Diagnosis not present

## 2021-07-29 DIAGNOSIS — M1000 Idiopathic gout, unspecified site: Secondary | ICD-10-CM | POA: Diagnosis not present

## 2021-07-29 LAB — COMPREHENSIVE METABOLIC PANEL
ALT: 24 U/L (ref 0–44)
AST: 50 U/L — ABNORMAL HIGH (ref 15–41)
Albumin: 2.9 g/dL — ABNORMAL LOW (ref 3.5–5.0)
Alkaline Phosphatase: 137 U/L — ABNORMAL HIGH (ref 38–126)
Anion gap: 10 (ref 5–15)
BUN: 26 mg/dL — ABNORMAL HIGH (ref 8–23)
CO2: 25 mmol/L (ref 22–32)
Calcium: 9.8 mg/dL (ref 8.9–10.3)
Chloride: 98 mmol/L (ref 98–111)
Creatinine, Ser: 0.73 mg/dL (ref 0.44–1.00)
GFR, Estimated: >60 mL/min (ref >60)
Glucose, Bld: 111 mg/dL — ABNORMAL HIGH (ref 70–99)
Potassium: 4.6 mmol/L (ref 3.5–5.1)
Sodium: 133 mmol/L — ABNORMAL LOW (ref 135–145)
Total Bilirubin: 0.4 mg/dL (ref 0.3–1.2)
Total Protein: 5.7 g/dL — ABNORMAL LOW (ref 6.5–8.1)

## 2021-07-29 LAB — CBC WITH DIFFERENTIAL/PLATELET
Abs Immature Granulocytes: 0.08 10*3/uL — ABNORMAL HIGH (ref 0.00–0.07)
Basophils Absolute: 0.1 10*3/uL (ref 0.0–0.1)
Basophils Relative: 0 %
Eosinophils Absolute: 0.2 10*3/uL (ref 0.0–0.5)
Eosinophils Relative: 1 %
HCT: 37.3 % (ref 36.0–46.0)
Hemoglobin: 11.8 g/dL — ABNORMAL LOW (ref 12.0–15.0)
Immature Granulocytes: 1 %
Lymphocytes Relative: 19 %
Lymphs Abs: 2.5 10*3/uL (ref 0.7–4.0)
MCH: 29.3 pg (ref 26.0–34.0)
MCHC: 31.6 g/dL (ref 30.0–36.0)
MCV: 92.6 fL (ref 80.0–100.0)
Monocytes Absolute: 1.2 10*3/uL — ABNORMAL HIGH (ref 0.1–1.0)
Monocytes Relative: 9 %
Neutro Abs: 9.3 10*3/uL — ABNORMAL HIGH (ref 1.7–7.7)
Neutrophils Relative %: 70 %
Platelets: 342 10*3/uL (ref 150–400)
RBC: 4.03 MIL/uL (ref 3.87–5.11)
RDW: 18.3 % — ABNORMAL HIGH (ref 11.5–15.5)
WBC: 13.3 10*3/uL — ABNORMAL HIGH (ref 4.0–10.5)
nRBC: 0 % (ref 0.0–0.2)

## 2021-07-29 LAB — GLUCOSE, CAPILLARY
Glucose-Capillary: 100 mg/dL — ABNORMAL HIGH (ref 70–99)
Glucose-Capillary: 118 mg/dL — ABNORMAL HIGH (ref 70–99)
Glucose-Capillary: 114 mg/dL — ABNORMAL HIGH (ref 70–99)
Glucose-Capillary: 196 mg/dL — ABNORMAL HIGH (ref 70–99)

## 2021-07-29 LAB — C-REACTIVE PROTEIN: CRP: 1.8 mg/dL — ABNORMAL HIGH (ref <1.0)

## 2021-07-29 LAB — SEDIMENTATION RATE: Sed Rate: 11 mm/hr (ref 0–22)

## 2021-07-29 MED ORDER — PREDNISONE 5 MG PO TABS
5.0000 mg | ORAL_TABLET | Freq: Every day | ORAL | Status: DC
  Administered 2021-07-30 – 2021-08-01 (×3): 5 mg via ORAL
  Filled 2021-07-29 (×3): qty 1

## 2021-07-29 NOTE — Progress Notes (Signed)
PROGRESS NOTE   Subjective/Complaints: No new problems. Occasional wheezing. Pain reasonable  ROS: Patient denies fever, rash, sore throat, blurred vision, nausea, vomiting, diarrhea, cough, shortness of breath or chest pain, joint or back pain, headache, or mood change.   Objective:   No results found. Recent Labs    07/29/21 0334  WBC 13.3*  HGB 11.8*  HCT 37.3  PLT 342    Recent Labs    07/29/21 0334  NA 133*  K 4.6  CL 98  CO2 25  GLUCOSE 111*  BUN 26*  CREATININE 0.73  CALCIUM 9.8      Intake/Output Summary (Last 24 hours) at 07/29/2021 1026 Last data filed at 07/28/2021 1919 Gross per 24 hour  Intake 600 ml  Output --  Net 600 ml        Physical Exam: Vital Signs Blood pressure 115/68, pulse 81, temperature 97.7 F (36.5 C), resp. rate 19, height '5\' 3"'$  (1.6 m), weight 77.3 kg, SpO2 100 %. Constitutional: No distress . Vital signs reviewed. HEENT: NCAT, EOMI, oral membranes moist Neck: supple Cardiovascular: RRR without murmur. No JVD    Respiratory/Chest: CTA Bilaterally without wheezes or rales. Normal effort    GI/Abdomen: BS +, non-tender, non-distended Ext: no clubbing, cyanosis, or edema Psych: pleasant and cooperative  Skin: dry Musc: No edema in extremities.  No tenderness in extremities. Neuro: Alert and oriented Motor 5/5 UE and 4-4+/5 LE's, stable  Assessment/Plan: 1. Functional deficits which require 3+ hours per day of interdisciplinary therapy in a comprehensive inpatient rehab setting. Physiatrist is providing close team supervision and 24 hour management of active medical problems listed below. Physiatrist and rehab team continue to assess barriers to discharge/monitor patient progress toward functional and medical goals  Care Tool:  Bathing    Body parts bathed by patient: Right arm, Left arm, Chest, Abdomen, Front perineal area, Face, Right upper leg, Left upper leg,  Buttocks, Right lower leg, Left lower leg   Body parts bathed by helper: Right lower leg, Left lower leg, Buttocks, Face     Bathing assist Assist Level: Contact Guard/Touching assist     Upper Body Dressing/Undressing Upper body dressing   What is the patient wearing?: Pull over shirt    Upper body assist Assist Level: Set up assist    Lower Body Dressing/Undressing Lower body dressing      What is the patient wearing?: Underwear/pull up, Pants     Lower body assist Assist for lower body dressing: Contact Guard/Touching assist     Toileting Toileting    Toileting assist Assist for toileting: Moderate Assistance - Patient 50 - 74%     Transfers Chair/bed transfer  Transfers assist  Chair/bed transfer activity did not occur: Safety/medical concerns  Chair/bed transfer assist level: Supervision/Verbal cueing (Rollator)     Locomotion Ambulation   Ambulation assist   Ambulation activity did not occur: Safety/medical concerns  Assist level: Supervision/Verbal cueing Assistive device: Rollator Max distance: 150'   Walk 10 feet activity   Assist  Walk 10 feet activity did not occur: Safety/medical concerns  Assist level: Supervision/Verbal cueing Assistive device: Rollator   Walk 50 feet activity   Assist Walk 50  feet with 2 turns activity did not occur: Safety/medical concerns  Assist level: Supervision/Verbal cueing Assistive device: Rollator    Walk 150 feet activity   Assist Walk 150 feet activity did not occur: Safety/medical concerns  Assist level: Supervision/Verbal cueing Assistive device: Rollator    Walk 10 feet on uneven surface  activity   Assist Walk 10 feet on uneven surfaces activity did not occur: Safety/medical concerns   Assist level: Supervision/Verbal cueing Assistive device: Rollator   Wheelchair     Assist Is the patient using a wheelchair?: Yes Type of Wheelchair: Manual Wheelchair activity did not occur:  Safety/medical concerns  Wheelchair assist level: Set up assist, Supervision/Verbal cueing Max wheelchair distance: >150'    Wheelchair 50 feet with 2 turns activity    Assist    Wheelchair 50 feet with 2 turns activity did not occur: Safety/medical concerns   Assist Level: Supervision/Verbal cueing   Wheelchair 150 feet activity     Assist  Wheelchair 150 feet activity did not occur: Safety/medical concerns   Assist Level: Supervision/Verbal cueing    Medical Problem List and Plan: 1.  Deficits with mobility, endurance, self-care secondary to debility from L3-4 osteomyelitis/discitis/septic shock/septic emboli/presumed endocarditis Also with L4-5 lumbar stenosis - severe   Continue CIR 2.  Antithrombotics: -DVT/anticoagulation:  Pharmaceutical: Lovenox             -antiplatelet therapy: Plavix 3. Left buttock pain/back pain- : Oxycodone prn             Continue Cymbalta and Gabapentin.              Monitor with increased exertion  increased cymbalta to 60 mg daily  Robaxin to 1000 mg q6 hours prn, decreased to 750 on 9/8.  Added voltaren gel QID for legs Controlled on 9/19 4. Mood: LCSW to follow for evaluation and support.   Started Buspar 5 mg TID for anxiety on 9/6.              -antipsychotic agents: N/a 5. Neuropsych: This patient is capable of making decisions on her own behalf. 6. Skin/Wound Care: Routine pressure relief measure. Foam dressing to sacrum 7. Fluids/Electrolytes/Nutrition: Intake poor --does not like the food here.             Liberalize diet to regular for flavor.  9/19 I personally reviewed the patient's labs today.  BUN/Cr sl increased  -encourage appropriate fluids 8. MSSA bacteremia w/sepsis:  Zosyn changed to Ancef through 9/22 9. Diskitis: Likely Tricuspid valve as source given pulm infectious nodules , TTE no vegetation , no TEE done.             - Appreciate ID recs  10. COPD/Pulmonary septic emboli: continue Ancef-encourage  pulmonary toilet with flutter valve             --continue Incurse inhaler daily             Wean supplemental oxygen as tolerated, no longer requires 11. Gout flare: start prednisone taper down to '15mg'$  daily on 9/12, decreased to 10 on 9/17  9/19 continue '10mg'$  daily 12.. Hypokalemia:  . Added supplement as Lasix on board. Potassium 4.6 Supplementing 13. Neurogenic bladder v/s overflow: retention resolved  14.  Loose stools w/incontinence/Neurogenic bowel?: Goes after she eats as well as prn.             --discontinued Miralax --KUB only with gas -no stools recorded yet -continue daily fiber 15. Obesity BMI 30.20: provide dietary counseling.  16. Leukocytosis, likely d/t steroids   Urine culture suggesting multiple species  WBCs 15.4 on 9/12, labs ordered for Monday  Afebrile, no signs/symptoms of infection 17.  Left cheek erythema/edema  Patient states this is a intermittent and sporadic problem that has been happening for >50 years, where her left cheek becomes erythematous and edematous and spontaneously resolves and then recurs a few days later in the cycle  Encourage diary 18.  Hyperglycemia- well controlled -dc cbg's  Continue to monitor in accordance with steroid wean  LOS: 19 days A FACE TO [place] 07/29/2021, 10:26 AM

## 2021-07-29 NOTE — Progress Notes (Signed)
Occupational Therapy Session Note  Patient Details  Name: Samantha King MRN: [id number] Date of Birth: 10/17/1946  Today's Date: 07/29/2021 OT Individual Time: 1300-1357 OT Individual Time Calculation (min): 57 min    Short Term Goals: Week 3:  OT Short Term Goal 1 (Week 3): STGs = LTGs  Skilled Therapeutic Interventions/Progress Updates:  Pt greeted seated in w/c agreeable to OT intervention. Pt able to complete w/c propulsion from room the ADL apartment with supervision. Worked on endurance and standing tolerance during IADL tasks. Pt pt able to stand for 4 mins and 21secs to roll silverware into napkins, as pt reports loving to entertain at home and enjoys kitchen tasks. Pt able to complete task with CGA, noted pt prefer to lean on counter, education provided on trying to maintain upright posture as flexing trunk makes it more difficult to take a deep breath. Pt additionally able to complete simulated cooking task where pt measures out water into mixing bowls and maneuvers bowls with water down to stove, problem solved methods for transporting water in the kitchen with pt deciding to place heavy mixing bowls of water on paper plates to allow bowls to slide and give the bowls a barrier to catch any leaky water that spills out of the bowls.  Pt additionally able to complete ambulatory shower transfer to walkin shower with 3n1 positioned on inside of shower, pt reports shower seat with arm rests at home. Pt plans to get suction grab bars at home. Set- up grab bar on R side of shower with pt able to step into shower with overall CGA using grab bar on R side and with pt holding onto arm rest of shower seat, MIN cues for safety awareness.  Pt able to ambulate ~ 50 ft back to room with rollator and CGA, pt needed seated rest break after ~ 70f with pt able to self propel w/c back to room. Pt handed off to RN to start IV antibiotics.   Therapy Documentation Precautions:  Precautions Precautions:  Fall Restrictions Weight Bearing Restrictions: No    Pain: Pt reports pain in R knee during session, offered rest breaks and distraction as pain mgmt strategies.    Therapy/Group: Individual Therapy  MPrecious Haws9/19/2022, 2:56 PM

## 2021-07-29 NOTE — Progress Notes (Signed)
Physical Therapy Session Note  Patient Details  Name: Samantha King MRN: [id number] Date of Birth: 01-Jun-1946  Today's Date: 07/29/2021 PT Individual Time: 1510-1540 PT Individual Time Calculation (min): 30 min   Short Term Goals: Week 3:  PT Short Term Goal 1 (Week 3): STG = LTG due to LOS  Skilled Therapeutic Interventions/Progress Updates:     Patient in w/c in the room upon PT arrival. Patient alert and agreeable to PT session. Patient denied pain during session.  Patient recounted events leading to and during her stay at the hospital. Answered general questions related to her pathology of osteomyelitis and sepsis and deferred detailed questions, specific to her case, to be directed to the medical team. Patient very pleased and complementary about her progress and treatment provided here on Rehab. Reviewed d/c plan and patient's safety concerns. She reports that she is most concerned about he endurance and risk of falls at home. Educated patient on fall risk/prevention, home modifications to prevent falls, and activation of emergency services in the event of a fall during session. Focused session on patient education and endurance training.   Therapeutic Activity: Transfers: Patient performed sit to/from stand with supervision using rollator. Provided verbal cues for safe use of AD, locking breaks before standing or sitting.  Therapeutic Exercise: Patient performed the following exercises for increased activity tolerance with verbal and tactile cues for proper technique. Patient ambulated 314 feet using a rollator with close supervision for safety. Ambulated with decreased gait speed, decreased step length and height, with toe catching on R x1, forward trunk lean, and downward head gaze. Provided verbal cues for erect posture, looking ahead, increased step height, and self monitoring for energy conservation and fatigue levels throughout.  Patient in w/c with her husband in the room  at end of session with breaks locked, seat belt alarm set, and all needs within reach.   Therapy Documentation Precautions:  Precautions Precautions: Fall Restrictions Weight Bearing Restrictions: No    Therapy/Group: Individual Therapy  Tarhonda Hollenberg L Henryetta Corriveau PT, DPT  07/29/2021, 4:16 PM

## 2021-07-29 NOTE — Progress Notes (Signed)
Physical Therapy Session Note  Patient Details  Name: Samantha King MRN: [id number] Date of Birth: August 12, 1946  Today's Date: 07/29/2021 PT Individual Time: 1005-1103 PT Individual Time Calculation (min): 58 min   Short Term Goals: Week 1:  PT Short Term Goal 1 (Week 1): Pt will perform supine <>sit w/min A PT Short Term Goal 1 - Progress (Week 1): Met PT Short Term Goal 2 (Week 1): Pt will perform sit <>stand w/LRAD and mod A PT Short Term Goal 2 - Progress (Week 1): Met PT Short Term Goal 3 (Week 1): Pt will perform bed <>chair transfers mod A w/LRAD PT Short Term Goal 3 - Progress (Week 1): Met PT Short Term Goal 4 (Week 1): Pt will maintain dynamic sitting balance w/no UE support for 2 minutes w/min A PT Short Term Goal 4 - Progress (Week 1): Met Week 2:  PT Short Term Goal 1 (Week 2): Pt will ambulate 25' w/LRAD and min A PT Short Term Goal 1 - Progress (Week 2): Met PT Short Term Goal 2 (Week 2): Pt will perform sit <>stands from WC w/LRAD and CGA consistently PT Short Term Goal 2 - Progress (Week 2): Met PT Short Term Goal 3 (Week 2): Pt will participate in balance assessment w/LRAD for improved activity tolerance PT Short Term Goal 3 - Progress (Week 2): Met Week 3:  PT Short Term Goal 1 (Week 3): STG = LTG due to LOS  Skilled Therapeutic Interventions/Progress Updates:  Pt received sitting in WC in room, reported 3/10 pain in low back and was premedicated. Offered stretching and positional changes throughout session for pain relief. Sit <>stands from [facility] and rollator throughout session w/S*. Pt ambulated 150' to dayroom w/rollator and S*, noted increased cadence, step clearance and improved posture, no verbal cues provided.   Therex  -Alt. Single leg step downs from 6" step w/emphasis on eccentric control of glute med and quads for improved LE strength and single leg stability. Min A for steadying assist, BUE support on rollator. 3x10 reps per side, required several minute  seated rest break between sets w/emphasis on pursed lip breathing. Verbal cues for upright posture, proper form, reduction of twisting and to reduce use of Ues to emphasize quad activation. SpO2 >93% throughout session on RA. Although pt is not on BLT precautions, educated pt on following BLT precautions for pain reduction w/functional mobility, as pt demonstrates tendency to twist and bend with activity and experiences significant increase in low back pain. Pt verbalized understanding.   Pt ambulated 150' back to room w/rollator and S*. Ambulated to toilet w/S*, voided continently and performed peri care independently. Washed hands at sink while standing mod I. Pt was left seated in WC in room, denied pain, all needs in reach.   Therapy Documentation Precautions:  Precautions Precautions: Fall Restrictions Weight Bearing Restrictions: No   Therapy/Group: Individual Therapy [name], PT, DPT  07/29/2021, 7:47 AM

## 2021-07-29 NOTE — Progress Notes (Signed)
Breathing tx administered for wheezing   07/29/21 0833  Vitals  Pulse Rate 81  Resp 19  MEWS COLOR  MEWS Score Color Green  Oxygen Therapy  SpO2 100 %  O2 Device Room Air  MEWS Score  MEWS Temp 0  MEWS Systolic 0  MEWS Pulse 0  MEWS RR 0  MEWS LOC 0  MEWS Score 0    Sheela Stack, LPN

## 2021-07-29 NOTE — Progress Notes (Addendum)
Patient ID: Samantha King, female   DOB: [birth year], 75 y.o.   MRN: [id number]  Met with pt to discuss equipment needs and follow up needs. She has ordered via [employer] a tub seat, toilet frame for over the commode and rollator rolling walker. She feels this is all she will need for home. Discussed OP therapies she reports [facility] is the closet Op rehab to them they live in [place]. Will make referral and ask them to contact pt to make follow up appointment. Continue to work on discharge for 9/22  1:00 PM Julia-OT and PT have decided to start off with home health therapies and then transition to OP once her endurance is better. Pt is in agreement with this plan. No pref regarding [place] agency.

## 2021-07-29 NOTE — Progress Notes (Signed)
Occupational Therapy Session Note  Patient Details  Name: Samantha King MRN: [id number] Date of Birth: Mar 16, 1946  Today's Date: 07/29/2021 OT Individual Time: 0830-0900 and 1115-1200 OT Individual Time Calculation (min): 30 min and 45 min   Short Term Goals: Week 3:  OT Short Term Goal 1 (Week 3): STGs = LTGs  Skilled Therapeutic Interventions/Progress Updates:    Visit 1:  Pain: No pain at rest, but does have L side hip pain with standing.   At start of session, pt receiving a breathing treatment.  Pt stated she had already used the toilet this morning and just needed to change her pants. Pt was able to sit to EOB with S, sit to stand to RW with S and then to arm chair to doff and don pants. She continued to need cues to sit down fully before trying to doff pants all the way to avoid a possible trip hazard.    The other day, pt able to cross legs to doff and don socks but she was having too much difficulty today.  Sock aid not available, so A pt with donning her hose and pt able to then get her shoes on.  Had pt stand at sink to brush teeth but stand tolerance only 2 min and pt felt she needed to sit quickly.   Transferred to wc so she could sit at the sink to continue to work on her makeup.  Pt in wc with call light in reach.  Visit 2: Pain: No pain at rest, but does have L side hip pain with standing.  Pt received in wc and pt showed me the shower chair she purchased online and it is very sturdy with handles. Pt then taken to ADL apt.  Told pt the goal was to stand a practice hanging towels on hangers at standing clothing rack. Pt able to rise to stand but without any UE support she was needing mod and then progressively close to max A to hold balance.  Had pt sit and rest. Discussed how she really does need UE support of at least 1 hand on kitchen or bathroom counter or RW.  Pt will need ongoing therapy after discharge to develop her endurance, standing tolerance and balance.   Discussed ways she can exercises her legs at home. Practiced active leg extensions using some resistance from the gait belt, floor bicycle pedal machine, and sit to squat from wc with tricep push ups and then using her legs to lower slowly to her seat.    Discussed the benefits of home health therapy initially and then transitioning to outpt. Pt needs to practice mobility in her home, especially getting in and out of her shower, kitchen and outside mobility.   Pt taken back to her room and set up at her sink so she could curl her hair.  Call light in reach.        Therapy Documentation Precautions:  Precautions Precautions: Fall Restrictions Weight Bearing Restrictions: No    Vital Signs: Therapy Vitals Pulse Rate: 81 Resp: 19 Oxygen Therapy SpO2: 100 % O2 Device: Room Air   Therapy/Group: Individual Therapy  [place] 07/29/2021, 12:19 PM

## 2021-07-29 NOTE — Progress Notes (Signed)
[facility] for Infectious Disease  Date of Admission:  07/10/2021           Reason for visit: Follow up on MSSA bacteremia  Current antibiotics: Ancef    ASSESSMENT:    75 y.o. female admitted with:  Discitis/osteomyelitis of lumbar spine: Currently on cefazolin for known MSSA positive blood cultures associated with her back infection.  Continuing antibiotics for 6 weeks from negative blood cultures through 08/01/2021.  Repeat labs obtained today were reassuring with improved leukocytosis, normal ESR, and barely elevated CRP (improved from several weeks ago). Leukocytosis: WBC improving. 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  On treatment as above.  RECOMMENDATIONS:    Continue cefazolin through 08/01/2021 Patient has follow-up with Dr. West Bali on 9/27 Will sign off, please call as needed   Principal Problem:   Debility Active Problems:   Severe muscle deconditioning   MSSA bacteremia   Loose stools   Supplemental oxygen dependent   Leukocytosis   Erythema   Hyperglycemia    MEDICATIONS:    Scheduled Meds:  vitamin C  500 mg Oral BID   atorvastatin  80 mg Oral q1800   busPIRone  5 mg Oral TID   Chlorhexidine Gluconate Cloth  6 each Topical Daily   clopidogrel  75 mg Oral Daily   colchicine  0.6 mg Oral Daily   diclofenac Sodium  4 g Topical QID   DULoxetine  60 mg Oral Daily   enoxaparin (LOVENOX) injection  40 mg Subcutaneous Q24H   feeding supplement  237 mL Oral TID WC   fluticasone  1 spray Each Nare Daily   furosemide  40 mg Oral Daily   gabapentin  300 mg Oral BID   Gerhardt's butt cream   Topical BID   insulin aspart  0-5 Units Subcutaneous QHS   insulin aspart  0-9 Units Subcutaneous TID WC   lidocaine  1 patch Transdermal Q24H   loratadine  10 mg Oral Daily   metoprolol tartrate  25 mg Oral BID   pantoprazole  40 mg Oral Daily   potassium chloride SA  20 mEq Oral BID    predniSONE  10 mg Oral Q breakfast   psyllium  1 packet Oral Daily   sodium chloride flush  10-40 mL Intracatheter Q12H   umeclidinium bromide  1 puff Inhalation Daily   zinc sulfate  220 mg Oral Daily   Continuous Infusions:  sodium chloride Stopped (07/27/21 1737)    ceFAZolin (ANCEF) IV 2 g (07/29/21 0511)   PRN Meds:.sodium chloride, acetaminophen, alum & mag hydroxide-simeth, bisacodyl, diphenhydrAMINE, guaiFENesin-dextromethorphan, ipratropium-albuterol, lidocaine, methocarbamol, oxyCODONE, polyethylene glycol, prochlorperazine **OR** prochlorperazine **OR** prochlorperazine, sodium chloride flush, sodium phosphate, traZODone  SUBJECTIVE:   24 hour events:  Stable events over the weekend Afebrile, T-max 97.7 Stable ESR Stable CRP WBC improved from last measurement Continues on Ancef with planned end date of 08/01/2021  No new complaints.  Anxious to know that infection will not come back after stopping antibiotics.   Review of Systems  All other systems reviewed and are negative.    OBJECTIVE:   Blood pressure 115/68, pulse 67, temperature 97.7 F (36.5 C), resp. rate 20, height _0  (1.6 m), weight 77.3 kg, SpO2 95 %. Body mass index is 30.2 kg/m.  Physical Exam Constitutional:      General: She is not in acute distress.    Appearance: Normal appearance.  HENT:     Head:  Normocephalic and atraumatic.  Eyes:     Extraocular Movements: Extraocular movements intact.     Conjunctiva/sclera: Conjunctivae normal.  Pulmonary:     Effort: Pulmonary effort is normal. No respiratory distress.  Skin:    General: Skin is warm and dry.     Findings: No rash.  Neurological:     General: No focal deficit present.     Mental Status: She is alert and oriented to person, place, and time.  Psychiatric:        Mood and Affect: Mood normal.        Behavior: Behavior normal.     Lab Results: Lab Results  Component Value Date   WBC 13.3 (H) 07/29/2021   HGB 11.8 (L)  07/29/2021   HCT 37.3 07/29/2021   MCV 92.6 07/29/2021   PLT 342 07/29/2021    Lab Results  Component Value Date   NA 133 (L) 07/29/2021   K 4.6 07/29/2021   CO2 25 07/29/2021   GLUCOSE 111 (H) 07/29/2021   BUN 26 (H) 07/29/2021   CREATININE 0.73 07/29/2021   CALCIUM 9.8 07/29/2021   GFRNONAA >60 07/29/2021   GFRAA >60 03/12/2020    Lab Results  Component Value Date   ALT 24 07/29/2021   AST 50 (H) 07/29/2021   ALKPHOS 137 (H) 07/29/2021   BILITOT 0.4 07/29/2021       Component Value Date/Time   CRP 1.8 (H) 07/29/2021 0334       Component Value Date/Time   ESRSEDRATE 11 07/29/2021 0334     I have reviewed the micro and lab results in Epic.  Imaging: No results found.   Imaging independently reviewed in Epic.    Raynelle Highland for Infectious Disease [facility] [phone] pager 07/29/2021, 8:31 AM  I spent greater than 25 minutes with the patient including greater than 50% of time in face to face counsel of the patient and in coordination of their care.

## 2021-07-30 DIAGNOSIS — R5381 Other malaise: Secondary | ICD-10-CM | POA: Diagnosis not present

## 2021-07-30 DIAGNOSIS — R7881 Bacteremia: Secondary | ICD-10-CM | POA: Diagnosis not present

## 2021-07-30 DIAGNOSIS — E876 Hypokalemia: Secondary | ICD-10-CM | POA: Diagnosis not present

## 2021-07-30 DIAGNOSIS — D72828 Other elevated white blood cell count: Secondary | ICD-10-CM | POA: Diagnosis not present

## 2021-07-30 LAB — GLUCOSE, CAPILLARY
Glucose-Capillary: 96 mg/dL (ref 70–99)
Glucose-Capillary: 90 mg/dL (ref 70–99)
Glucose-Capillary: 89 mg/dL (ref 70–99)
Glucose-Capillary: 129 mg/dL — ABNORMAL HIGH (ref 70–99)

## 2021-07-30 NOTE — Progress Notes (Signed)
Physical Therapy Session Note  Patient Details  Name: Samantha King MRN: [id number] Date of Birth: 06/15/1946  Today's Date: 07/30/2021 PT Individual Time: 1001-1101 PT Individual Time Calculation (min): 60 min   Short Term Goals: Week 1:  PT Short Term Goal 1 (Week 1): Pt will perform supine <>sit w/min A PT Short Term Goal 1 - Progress (Week 1): Met PT Short Term Goal 2 (Week 1): Pt will perform sit <>stand w/LRAD and mod A PT Short Term Goal 2 - Progress (Week 1): Met PT Short Term Goal 3 (Week 1): Pt will perform bed <>chair transfers mod A w/LRAD PT Short Term Goal 3 - Progress (Week 1): Met PT Short Term Goal 4 (Week 1): Pt will maintain dynamic sitting balance w/no UE support for 2 minutes w/min A PT Short Term Goal 4 - Progress (Week 1): Met Week 2:  PT Short Term Goal 1 (Week 2): Pt will ambulate 25' w/LRAD and min A PT Short Term Goal 1 - Progress (Week 2): Met PT Short Term Goal 2 (Week 2): Pt will perform sit <>stands from WC w/LRAD and CGA consistently PT Short Term Goal 2 - Progress (Week 2): Met PT Short Term Goal 3 (Week 2): Pt will participate in balance assessment w/LRAD for improved activity tolerance PT Short Term Goal 3 - Progress (Week 2): Met Week 3:  PT Short Term Goal 1 (Week 3): STG = LTG due to LOS  Skilled Therapeutic Interventions/Progress Updates:  Pt received seated in WC in room, denied pain and was agreeable to PT. Emphasis of session on preparing to DC home this week and improved activity tolerance. Pt performed sit <>stands from [facility], rollator and toilet throughout session w/S*. Pt ambulated >300' w/ 2 seated rest break and S*, intermittent verbal cues to maintain safe distance to rollator and maintain upright posture. Pt performed car transfer w/S*, no verbal cues provided, to prepare for DC home this week. Pt ambulated 100' and ascended/descended 8 6" steps using R handrail only and step-to pattern w/S*. Verbal cues for foot placement while  descending steps and encouraged standing rest break at top of stairs for energy conservation and improved cardiovascular endurance. After short seated rest break, pt ambulated 300' back to room w/rollator and one seated rest break. Pt voided continently in room w/S* and performed peri care independently. Pt was left seated in WC in room, denied pain and all needs in reach.    Therapy Documentation Precautions:  Precautions Precautions: Fall Restrictions Weight Bearing Restrictions: No   Therapy/Group: Individual Therapy Samantha King Samantha King, PT, DPT  07/30/2021, 7:54 AM

## 2021-07-30 NOTE — Progress Notes (Signed)
Nutrition Follow-up  DOCUMENTATION CODES:   Not applicable  INTERVENTION:  Continue Ensure Enlive po TID, each supplement provides 350 kcal and 20 grams of protein.   Encourage adequate PO intake.   NUTRITION DIAGNOSIS:   Increased nutrient needs related to acute illness (Sepsis & Rehab) as evidenced by estimated needs; ongoing  GOAL:   Patient will meet greater than or equal to 90% of their needs; met  MONITOR:   PO intake, Supplement acceptance  REASON FOR ASSESSMENT:   Consult Other (Comment)  ASSESSMENT:   Pt admitted to CIR from [facility] for continued rehab due to sepsis complications. Pt with PMH of COPD, HF, HTN, and GERD. Pt initially admitted to [facility] for back pain and sepsis due to MSSA bacteremia. Deficits with mobility, endurance, self-care secondary to debility from osteomyelitis/discitis/septic shock/septic emboli/presumed endocarditis.  Meal completion has been 75-100%. Pt has been tolerating her PO diet. Pt currently has Ensure ordered and has been consuming them. RD to continue with current orders. Plans for discharge 9/22.  Labs and medications reviewed.   Diet Order:   Diet Order             Diet regular Room service appropriate? Yes; Fluid consistency: Thin  Diet effective now                   EDUCATION NEEDS:   No education needs have been identified at this time  Skin:  Skin Assessment: Reviewed RN Assessment  Last BM:  9/18  Height:   Ht Readings from Last 1 Encounters:  07/10/21 5' 3"  (1.6 m)    Weight:   Wt Readings from Last 1 Encounters:  07/10/21 77.3 kg    Ideal Body Weight:  52 kg  BMI:  Body mass index is 30.2 kg/m.  Estimated Nutritional Needs:   Kcal:  1700-1900  Protein:  85-100 grams  Fluid:  1700-1900 mL  Corrin Parker, MS, RD, LDN RD pager number/after hours weekend pager number on Amion.

## 2021-07-30 NOTE — Progress Notes (Signed)
Occupational Therapy Session Note  Patient Details  Name: Samantha King MRN: [id number] Date of Birth: 06-02-1946  Today's Date: 07/30/2021 OT Individual Time: 0830-0930 OT Individual Time Calculation (min): 60 min    Short Term Goals: Week 3:  OT Short Term Goal 1 (Week 3): STGs = LTGs  Skilled Therapeutic Interventions/Progress Updates:      Pt seen for BADL retraining of toileting, bathing, and dressing with a focus on functional mobility and endurance. Pt's bed placed flat and she sat up to EOB with no A using bed rail. Pt ordered a bed rail for home use so this is the transfer she will use at home.  She slipped on her shoes and then used her rollater into bathroom with close S to light CGA then sat on toilet and toileted with S. She remembered to doff pants over feet in sitting without cues. Transferred to bench to shower with set up and then S when she needed to stand to wash bottom.  Pt dried off and then ambulated back to room to continue dressing.  She continues to need cues to slow down and pace herself, otherwise her overall activity tolerance is improving.  Pt completed all self care and set up at sink to complete hair care and makeup.   Pt in chair with call light and phone in reach.       Therapy Documentation Precautions:  Precautions Precautions: Fall Restrictions Weight Bearing Restrictions: No  Vital Signs: Oxygen Therapy SpO2: 93 % O2 Device: Room Air Pain: Pain Assessment Pain Score: 0-No pain at rest, some pain with movement  ADL: ADL Eating: Independent Grooming: Supervision/safety Where Assessed-Grooming: Standing at sink Upper Body Bathing: Supervision/safety Where Assessed-Upper Body Bathing: Shower Lower Body Bathing: Supervision/safety Where Assessed-Lower Body Bathing: Shower Upper Body Dressing: Setup Lower Body Dressing: Supervision/safety Where Assessed-Lower Body Dressing: Chair Toileting: Supervision/safety Where  Assessed-Toileting: Glass blower/designer: Close supervision Armed forces technical officer Method: Counselling psychologist: Emergency planning/management officer Transfer: Curator Method: Heritage manager: Grab bars, Transfer tub bench   Therapy/Group: Individual Therapy  [place] 07/30/2021, 11:52 AM

## 2021-07-30 NOTE — Progress Notes (Signed)
Occupational Therapy Session Note  Patient Details  Name: Samantha King MRN: [id number] Date of Birth: 1946-08-21  Today's Date: 07/30/2021 OT Individual Time: 1131-1200 OT Individual Time Calculation (min): 29 min (  session 1) Second session: 7564-3329 (64 mins)   Short Term Goals: Week 3:  OT Short Term Goal 1 (Week 3): STGs = LTGs  Skilled Therapeutic Interventions/Progress Updates:  Session 1: Pt greeted seated in w/c  agreeable to OT intervention. Session focus on therapeutic activities focused on increasing functional endurance and BLE strength and endurance for higher level functional mobility. Pt completed w/c propulsion from room>therapy gym where pt completed stair training with MIN A, pt able to ascend 3 steps with pt holding onto rail on R side and descend with MIN A holding onto rail on R side. Pt required seated rest break post therapeutic activities reporting fatigue. Pt rested ~ 3 mins and requested to try stairs again, pt sit<>stand with supervision but then noted to experience moment of R knee buckling, suspended activity and had pt return to chair with MIN A for safety. Pt able to ambulate back to room with rollator and gross supervision, pt left seated in w/c with safety belt activated and all needs within reach.                      Session 2:            Pt greeted seated in w/c agreeable to OT intervention. Session focus on implementing BUE HEP for home with level 3 theraband and various therapeutic activities focused on standing tolerance and overall endurance. Pt completed therex as indicated below with level 3 theraband: X10 bicep curls X10 shoulder flexion X10 tricep rows X10 chest pulls X10 punches  Pt completed w/c propulsion to therapy gym where pt able to stand for 1 min and 27 seconds to complete standing card matching activity where pt able to reach out of BOS to match cards with supervision. Pt additionally able to engage in seated BUE therapeutic  activity where pt able to tap ball back and forth with OTA with pt using 2lb dowel rod to volley beach ball back and forth. Pt seemed to really enjoy activity requesting ideas on how to complete activity at home. Pt completed functional mobility back to room with rollator with supervision, pt completed ambulatory toilet transfer with supervision with pt able to complete 3/3 toileting tasks. Pt left seated in w/c with alarm belt activated and all needs within reah.    Therapy Documentation Precautions:  Precautions Precautions: Fall Restrictions Weight Bearing Restrictions: No    Pain: pt reports moments of back pain during session, offered rest breaks for pain mgmt stratiges.     Therapy/Group: Individual Therapy  Precious Haws 07/30/2021, 1:10 PM

## 2021-07-30 NOTE — Progress Notes (Signed)
PROGRESS NOTE   Subjective/Complaints: Patient seen sitting up this morning with therapies.  She states she slept well overnight.  Therapies note patient making good progress, meeting goals.  ROS: Denies CP, SOB, N/V/D  Objective:   No results found. Recent Labs    07/29/21 0334  WBC 13.3*  HGB 11.8*  HCT 37.3  PLT 342     Recent Labs    07/29/21 0334  NA 133*  K 4.6  CL 98  CO2 25  GLUCOSE 111*  BUN 26*  CREATININE 0.73  CALCIUM 9.8       Intake/Output Summary (Last 24 hours) at 07/30/2021 0959 Last data filed at 07/30/2021 0900 Gross per 24 hour  Intake 360 ml  Output --  Net 360 ml         Physical Exam: Vital Signs Blood pressure 103/79, pulse 74, temperature 98 F (36.7 C), temperature source Oral, resp. rate 18, height 5\' 3"  (1.6 m), weight 77.3 kg, SpO2 93 %. Constitutional: No distress . Vital signs reviewed. HENT: Normocephalic.  Atraumatic. Eyes: EOMI. No discharge. Cardiovascular: No JVD.  RRR. Respiratory: Normal effort.  No stridor.  Bilateral clear to auscultation. GI: Non-distended.  BS +. Skin: Warm and dry.  Intact. Psych: Normal mood.  Normal behavior. Musc: No edema in extremities.  No tenderness in extremities. Neuro: Alert and oriented Motor 5/5 UE and 4-4+/5 LE's, unchanged  Assessment/Plan: 1. Functional deficits which require 3+ hours per day of interdisciplinary therapy in a comprehensive inpatient rehab setting. Physiatrist is providing close team supervision and 24 hour management of active medical problems listed below. Physiatrist and rehab team continue to assess barriers to discharge/monitor patient progress toward functional and medical goals  Care Tool:  Bathing    Body parts bathed by patient: Right arm, Left arm, Chest, Abdomen, Front perineal area, Face, Right upper leg, Left upper leg, Buttocks, Right lower leg, Left lower leg   Body parts bathed by  helper: Right lower leg, Left lower leg, Buttocks, Face     Bathing assist Assist Level: Contact Guard/Touching assist     Upper Body Dressing/Undressing Upper body dressing   What is the patient wearing?: Pull over shirt    Upper body assist Assist Level: Set up assist    Lower Body Dressing/Undressing Lower body dressing      What is the patient wearing?: Underwear/pull up, Pants     Lower body assist Assist for lower body dressing: Contact Guard/Touching assist     Toileting Toileting    Toileting assist Assist for toileting: Supervision/Verbal cueing     Transfers Chair/bed transfer  Transfers assist  Chair/bed transfer activity did not occur: Safety/medical concerns  Chair/bed transfer assist level: Supervision/Verbal cueing (rollator)     Locomotion Ambulation   Ambulation assist   Ambulation activity did not occur: Safety/medical concerns  Assist level: Supervision/Verbal cueing Assistive device: Rollator Max distance: 150'   Walk 10 feet activity   Assist  Walk 10 feet activity did not occur: Safety/medical concerns  Assist level: Supervision/Verbal cueing Assistive device: Rollator   Walk 50 feet activity   Assist Walk 50 feet with 2 turns activity did not occur: Safety/medical concerns  Assist level: Supervision/Verbal cueing Assistive device: Rollator    Walk 150 feet activity   Assist Walk 150 feet activity did not occur: Safety/medical concerns  Assist level: Supervision/Verbal cueing Assistive device: Rollator    Walk 10 feet on uneven surface  activity   Assist Walk 10 feet on uneven surfaces activity did not occur: Safety/medical concerns   Assist level: Supervision/Verbal cueing Assistive device: Rollator   Wheelchair     Assist Is the patient using a wheelchair?: Yes Type of Wheelchair: Manual Wheelchair activity did not occur: Safety/medical concerns  Wheelchair assist level: Set up assist,  Supervision/Verbal cueing Max wheelchair distance: >150'    Wheelchair 50 feet with 2 turns activity    Assist    Wheelchair 50 feet with 2 turns activity did not occur: Safety/medical concerns   Assist Level: Supervision/Verbal cueing   Wheelchair 150 feet activity     Assist  Wheelchair 150 feet activity did not occur: Safety/medical concerns   Assist Level: Supervision/Verbal cueing    Medical Problem List and Plan: 1.  Deficits with mobility, endurance, self-care secondary to debility from L3-4 osteomyelitis/discitis/septic shock/septic emboli/presumed endocarditis Also with L4-5 lumbar stenosis - severe   Continue CIR  Team conference today to discuss current and goals and coordination of care, home and environmental barriers, and discharge planning with nursing, case manager, and therapies. Please see conference note from today as well.  2.  Antithrombotics: -DVT/anticoagulation:  Pharmaceutical: Lovenox             -antiplatelet therapy: Plavix 3. Left buttock pain/back pain- : Oxycodone prn             Continue Cymbalta and Gabapentin.              Monitor with increased exertion  increased cymbalta to 60 mg daily  Robaxin to 1000 mg q6 hours prn, decreased to 750 on 9/8.  Added voltaren gel QID for legs Controlled on 9/20 4. Mood: LCSW to follow for evaluation and support.   Started Buspar 5 mg TID for anxiety on 9/6.              -antipsychotic agents: N/a 5. Neuropsych: This patient is capable of making decisions on her own behalf. 6. Skin/Wound Care: Routine pressure relief measure. Foam dressing to sacrum 7. Fluids/Electrolytes/Nutrition: Intake poor --does not like the food here.             Liberalize diet to regular for flavor.   -encourage appropriate fluids 8. MSSA bacteremia w/sepsis:  Zosyn changed to Ancef through 9/22 9. Diskitis: Likely Tricuspid valve as source given pulm infectious nodules , TTE no vegetation , no TEE done.             -  Appreciate ID recs  10. COPD/Pulmonary septic emboli: continue Ancef-encourage pulmonary toilet with flutter valve             --continue Incurse inhaler daily             Wean supplemental oxygen as tolerated, no longer requires 11. Gout flare: start prednisone taper down to 15mg  daily on 9/12, decreased to 10 on 9/17, decreased to 5 on 9/20 12. Hypokalemia:  . Added supplement as Lasix on board. Potassium 4.6 on 9/19 Supplementing 13. Neurogenic bladder v/s overflow: retention resolved  14.  Loose stools w/incontinence/Neurogenic bowel?: Goes after she eats as well as prn.             --discontinued Miralax --KUB only with gas -no  stools recorded yet -continue daily fiber 15. Obesity BMI 30.20: provide dietary counseling.  16. Leukocytosis, likely d/t steroids   Urine culture suggesting multiple species  WBCs 13.3 on 9/19  Afebrile, no signs/symptoms of infection 17.  Left cheek erythema/edema  Patient states this is a intermittent and sporadic problem that has been happening for >50 years, where her left cheek becomes erythematous and edematous and spontaneously resolves and then recurs a few days later in the cycle  Encourage diary 18.  Hyperglycemia- well controlled -dc cbg's  Continue to monitor in accordance with steroid wean  LOS: 20 days A FACE TO FACE EVALUATION WAS PERFORMED  Samantha King Lorie Phenix 07/30/2021, 9:59 AM

## 2021-07-30 NOTE — Patient Care Conference (Signed)
Inpatient RehabilitationTeam Conference and Plan of Care Update Date: 07/30/2021   Time: 12:13 PM    Patient Name: Samantha King Record Number: [id number]  Date of Birth: 30-Apr-1946 Sex: Female         Room/Bed: 4W06C/4W06C-01 Payor Info: Payor: MEDICARE / Plan: MEDICARE PART A AND B / Product Type: *No Product type* /    Admit Date/Time:  07/10/2021  3:44 PM  Primary Diagnosis:  [facility] Problems: Principal Problem:   Debility Active Problems:   Severe muscle deconditioning   MSSA bacteremia   Loose stools   Supplemental oxygen dependent   Leukocytosis   Erythema   Hyperglycemia    Expected Discharge Date: Expected Discharge Date: 08/01/21  Team Members Present: Physician leading conference: Dr. Delice King Social Worker Present: Samantha Kin, LCSW Nurse Present: Samantha Chihuahua, RN PT Present: Samantha King, PT OT Present: Samantha King, OT SLP Present: Samantha King, SLP PPS Coordinator present : Samantha King, SLP     Current Status/Progress Goal Weekly Team Focus  Bowel/Bladder   Pt is continent of B/B. LBM 07/29/2021  REgular BMs every 3 days or less  Assess B/B every shift and PRN   Swallow/Nutrition/ Hydration             ADL's   Supervision with ADLS. CGA transfers.  LTGs have been upgraded to supervision except CGA with shower stall transfers.  family education, ADL training, activity tolerance, balance and UE strengthening   Mobility   Mod I bed mobility, S* transfers and gait up to 150'  Upgraded to mod I-supervision  Cardiovascular endurance, prepare for DC home, confidence w/mobility, pain management   Communication             Safety/Cognition/ Behavioral Observations            Pain   Pt reports lower back and left hip pain 5/10. Using PRN oxyodone, voltaren gel, and lidoderm patches  pain goal of <3/10  Assess pain every shift and PRN   Skin   MASD to perineal area improving  No breakdown  Assess skin every shift  and PRN     Discharge Planning:  Preparing for discharge Thursday last day of IV antibiotics, husband can provide 24/7 supervision. Start off with home health due to endurance and transition to OP   Team Discussion: Ancef through 08/01/21. Steroid weaning ongoing.  Patient on target to meet rehab goals: yes  *See Care Plan and progress notes for long and short-term goals.   Revisions to Treatment Plan:  Focus on endurance and energy conservation tips   Teaching Needs: Skin care, transfers, safety, medications, dietary modifications, etc  Current Barriers to Discharge: Decreased caregiver support and Home enviroment access/layout  Possible Resolutions to Barriers: HH follow up recommended     Medical Summary Current Status: Deficits with mobility, endurance, self-care secondary to debility from L3-4 osteomyelitis/discitis/septic shock/septic emboli/presumed endocarditis  Barriers to Discharge: Medical stability;IV antibiotics;Weight;Wound care   Possible Resolutions to Barriers/Weekly Focus: Therapies, pt and family edu, follow labs - WBCs, K+, wean steroids for gout   Continued Need for Acute Rehabilitation Level of Care: The patient requires daily medical management by a physician with specialized training in physical medicine and rehabilitation for the following reasons: Direction of a multidisciplinary physical rehabilitation program to maximize functional independence : Yes Medical management of patient stability for increased activity during participation in an intensive rehabilitation regime.: Yes Analysis of laboratory values and/or radiology reports with  any subsequent need for medication adjustment and/or medical intervention. : Yes   I attest that I was present, lead the team conference, and concur with the assessment and plan of the team.   Samantha King 07/30/2021, 4:23 PM

## 2021-07-30 NOTE — Progress Notes (Signed)
Patient ID: Samantha King, female   DOB: 1945-12-30, 75 y.o.   MRN: [id number]  Met with pt to discuss team conference progress this week toward her goals and discharge still 9/22. She is looking forward to going home and feels she will be ready. She feel she has accomplished what she set out to do here and is only supervision level at discharge. Home health set up via [facility] and no equipment needs-has ordered on her own.

## 2021-07-30 NOTE — Progress Notes (Signed)
Physical Therapy Discharge Summary  Patient Details  Name: Samantha King MRN: [id number] Date of Birth: Mar 26, 1946  Today's Date: 07/31/2021 PT Individual Time: 1540-0867 PT Individual Time Calculation (min): 53 min    Patient has met 11 of 11 long term goals due to improved activity tolerance, improved balance, improved postural control, increased strength, increased range of motion, decreased pain, and improved coordination.  Patient to discharge at an ambulatory level Modified Independent.   Patient's care partner has participated in family training and is independent to provide the necessary physical assistance at discharge.   Recommendation:  Patient will benefit from ongoing skilled PT services in  home health setting initially with plans to progress to outpatient  to continue to advance safe functional mobility, address ongoing impairments in balance, global deconditioning, cardiovascular endurance and minimize fall risk.  Equipment: Pt has purchased her own rollator   Reasons for discharge: treatment goals met and discharge from hospital  Patient/family agrees with progress made and goals achieved: Yes  PT Treatment  Pt received seated in WC in room, denied pain and was premedicated. Emphasis of session on establishing HEP for pt to use upon DC home for improved LE strength, dynamic balance and cardiovascular endurance. Sit <>stand pivot from WC to EOB without AD w/S*. Pt performed all bed mobility w/HOB flat and mod I using bedrail. Sit <>stand pivot from EOB to WC w/S* and no AD. Remainder of session spent educating pt on HEP, pt able to teach back all exercises and all questions were answered. Pt very satisfied with her progress in CIR and excited to go home. Pt was left seated in WC in room, all needs in reach.   PT Discharge Precautions/Restrictions Precautions Precautions: Fall Restrictions Weight Bearing Restrictions: No Vital Signs Oxygen Therapy SpO2: 93 % O2  Device: Room Air Pain Pain Assessment Pain Scale: 0-10 Pain Score: 0-No pain Faces Pain Scale: No hurt Pain Type: Acute pain Pain Location: Back Pain Orientation: Lower Pain Intervention(s): Medication (See eMAR) PAINAD (Pain Assessment in Advanced Dementia) Breathing: normal Negative Vocalization: none Facial Expression: smiling or inexpressive Body Language: relaxed Pain Interference Pain Interference Pain Effect on Sleep: 1. Rarely or not at all Pain Interference with Therapy Activities: 2. Occasionally Pain Interference with Day-to-Day Activities: 1. Rarely or not at all Vision/Perception  Vision - History Ability to See in Adequate Light: 0 Adequate Perception Perception: Within Functional Limits Praxis Praxis: Intact  Cognition Overall Cognitive Status: Within Functional Limits for tasks assessed Arousal/Alertness: Awake/alert Orientation Level: Oriented X4 Year: 2022 Month: September Day of Week: Correct Memory: Appears intact Immediate Memory Recall: Sock;Blue;Bed Memory Recall Sock: Without Cue Memory Recall Blue: Without Cue Memory Recall Bed: Without Cue Safety/Judgment: Appears intact Sensation Sensation Light Touch: Appears Intact Coordination Gross Motor Movements are Fluid and Coordinated: Yes Finger Nose Finger Test: [facility] Heel Shin Test: [facility] Motor  Motor Motor: Within Functional Limits Motor - Discharge Observations: Limited by pain in low back  Mobility Bed Mobility Bed Mobility: Rolling Right;Rolling Left;Supine to Sit;Sitting - Scoot to [employer] of Bed;Sit to Supine Rolling Right: Independent with assistive device (Flat HOB, bedrail) Rolling Left: Independent with assistive device (Flat HOB, bedrail) Supine to Sit: Independent with assistive device (Flat HOB, bedrail) Sitting - Scoot to Edge of Bed: Independent with assistive device (Flat HOB, bedrail) Sit to Supine: Independent with assistive device (Flat HOB, bedrail) Transfers Transfers:  Sit to Stand;Stand to Sit;Stand Pivot Transfers Sit to Stand: Modified Independent (Rollator) Stand to Sit: Modified Independent Stand Pivot  Transfers: Modified Independent Transfer (Assistive device): Rollator Locomotion  Gait Ambulation: Yes Gait Assistance: Supervision/Verbal cueing Gait Distance (Feet): 150 Feet Assistive device: Rollator Gait Gait: Yes Gait Pattern: Impaired Gait Pattern: Decreased step length - left;Decreased step length - right;Poor foot clearance - left;Poor foot clearance - right;Trunk flexed Gait velocity: decreased Stairs / Additional Locomotion Stairs: Yes Stairs Assistance: Supervision/Verbal cueing Stair Management Technique: One rail Right Number of Stairs: 8 Height of Stairs: 6 Ramp: Supervision/Verbal cueing Curb: Supervision/Verbal cueing Wheelchair Mobility Wheelchair Mobility: No  Trunk/Postural Assessment  Cervical Assessment Cervical Assessment: Within Functional Limits Thoracic Assessment Thoracic Assessment: Exceptions to [facility] (Kyphotic) Lumbar Assessment Lumbar Assessment: Exceptions to [facility] (Posterior pelvic tilt) Postural Control Postural Control: Within Functional Limits  Balance Balance Balance Assessed: Yes Static Sitting Balance Static Sitting - Balance Support: Feet supported Static Sitting - Level of Assistance: 7: Independent Dynamic Sitting Balance Dynamic Sitting - Balance Support: Feet supported;During functional activity Dynamic Sitting - Level of Assistance: 7: Independent Static Standing Balance Static Standing - Balance Support: During functional activity;Bilateral upper extremity supported Static Standing - Level of Assistance: 6: Modified independent (Device/Increase time) Dynamic Standing Balance Dynamic Standing - Balance Support: During functional activity;Bilateral upper extremity supported Dynamic Standing - Level of Assistance: 6: Modified Independent Extremity Assessment  RLE Assessment RLE  Assessment: Exceptions to [facility] RLE Strength Right Hip Flexion: 4/5 Right Hip Extension: 4/5 Right Hip ABduction: 4+/5 Right Hip ADduction: 4+/5 Right Knee Flexion: 4/5 Right Knee Extension: 4+/5 Right Ankle Dorsiflexion: 4/5 Right Ankle Plantar Flexion: 4+/5 LLE Assessment LLE Assessment: Exceptions to [facility] LLE Strength Left Hip Flexion: 4/5 Left Hip Extension: 4/5 Left Hip ABduction: 4+/5 Left Hip ADduction: 4+/5 Left Knee Flexion: 4+/5 Left Knee Extension: 4+/5 Left Ankle Dorsiflexion: 4/5 Left Ankle Plantar Flexion: 4+/5    Bracy Pepper E Zuhair Lariccia, PT, DPT 07/30/2021, 10:27 AM

## 2021-07-31 DIAGNOSIS — F418 Other specified anxiety disorders: Secondary | ICD-10-CM

## 2021-07-31 DIAGNOSIS — M109 Gout, unspecified: Secondary | ICD-10-CM

## 2021-07-31 DIAGNOSIS — R945 Abnormal results of liver function studies: Secondary | ICD-10-CM

## 2021-07-31 DIAGNOSIS — R7881 Bacteremia: Secondary | ICD-10-CM | POA: Diagnosis not present

## 2021-07-31 DIAGNOSIS — D72828 Other elevated white blood cell count: Secondary | ICD-10-CM | POA: Diagnosis not present

## 2021-07-31 DIAGNOSIS — R5381 Other malaise: Secondary | ICD-10-CM | POA: Diagnosis not present

## 2021-07-31 DIAGNOSIS — E8809 Other disorders of plasma-protein metabolism, not elsewhere classified: Secondary | ICD-10-CM

## 2021-07-31 DIAGNOSIS — E871 Hypo-osmolality and hyponatremia: Secondary | ICD-10-CM

## 2021-07-31 DIAGNOSIS — R7989 Other specified abnormal findings of blood chemistry: Secondary | ICD-10-CM

## 2021-07-31 LAB — GLUCOSE, CAPILLARY
Glucose-Capillary: 92 mg/dL (ref 70–99)
Glucose-Capillary: 97 mg/dL (ref 70–99)
Glucose-Capillary: 127 mg/dL — ABNORMAL HIGH (ref 70–99)
Glucose-Capillary: 123 mg/dL — ABNORMAL HIGH (ref 70–99)

## 2021-07-31 MED ORDER — LORATADINE 10 MG PO TABS: 10.0000 mg | ORAL_TABLET | Freq: Every day | ORAL | Status: AC

## 2021-07-31 MED ORDER — METOPROLOL TARTRATE 25 MG PO TABS
25.0000 mg | ORAL_TABLET | Freq: Two times a day (BID) | ORAL | 0 refills | Status: DC
  Filled 2021-07-31: qty 60, 30d supply, fill #0

## 2021-07-31 MED ORDER — BUSPIRONE HCL 5 MG PO TABS
5.0000 mg | ORAL_TABLET | Freq: Three times a day (TID) | ORAL | 0 refills | Status: DC
  Filled 2021-07-31: qty 90, 30d supply, fill #0

## 2021-07-31 MED ORDER — FUROSEMIDE 40 MG PO TABS
40.0000 mg | ORAL_TABLET | Freq: Every day | ORAL | 0 refills | Status: AC
  Filled 2021-07-31: qty 30, 30d supply, fill #0

## 2021-07-31 MED ORDER — UMECLIDINIUM BROMIDE 62.5 MCG/INH IN AEPB
1.0000 | INHALATION_SPRAY | Freq: Every day | RESPIRATORY_TRACT | 0 refills | Status: DC
  Filled 2021-07-31: qty 30, 30d supply, fill #0

## 2021-07-31 MED ORDER — METHOCARBAMOL 750 MG PO TABS
750.0000 mg | ORAL_TABLET | Freq: Four times a day (QID) | ORAL | 0 refills | Status: DC | PRN
  Filled 2021-07-31: qty 120, 30d supply, fill #0

## 2021-07-31 MED ORDER — POTASSIUM CHLORIDE CRYS ER 20 MEQ PO TBCR
20.0000 meq | EXTENDED_RELEASE_TABLET | Freq: Two times a day (BID) | ORAL | 0 refills | Status: AC
  Filled 2021-07-31: qty 60, 30d supply, fill #0

## 2021-07-31 MED ORDER — DULOXETINE HCL 60 MG PO CPEP
60.0000 mg | ORAL_CAPSULE | Freq: Every day | ORAL | 0 refills | Status: DC
  Filled 2021-07-31: qty 30, 30d supply, fill #0

## 2021-07-31 MED ORDER — DICLOFENAC SODIUM 1 % EX GEL
4.0000 g | Freq: Four times a day (QID) | CUTANEOUS | 0 refills | Status: DC
  Filled 2021-07-31: qty 400, 30d supply, fill #0

## 2021-07-31 MED ORDER — COLCHICINE 0.6 MG PO TABS
0.6000 mg | ORAL_TABLET | Freq: Every day | ORAL | 0 refills | Status: DC
  Filled 2021-07-31: qty 30, 30d supply, fill #0

## 2021-07-31 MED ORDER — PANTOPRAZOLE SODIUM 40 MG PO TBEC
40.0000 mg | DELAYED_RELEASE_TABLET | Freq: Every day | ORAL | 0 refills | Status: AC
  Filled 2021-07-31: qty 30, 30d supply, fill #0

## 2021-07-31 MED ORDER — LIDOCAINE 5 % EX PTCH
1.0000 | MEDICATED_PATCH | CUTANEOUS | 0 refills | Status: AC
  Filled 2021-07-31: qty 30, 30d supply, fill #0

## 2021-07-31 MED ORDER — ASCORBIC ACID 500 MG PO TABS
500.0000 mg | ORAL_TABLET | Freq: Two times a day (BID) | ORAL | 0 refills | Status: AC
  Filled 2021-07-31: qty 60, 30d supply, fill #0

## 2021-07-31 MED ORDER — CLOPIDOGREL BISULFATE 75 MG PO TABS
75.0000 mg | ORAL_TABLET | Freq: Every day | ORAL | 0 refills | Status: AC
  Filled 2021-07-31: qty 30, 30d supply, fill #0

## 2021-07-31 MED ORDER — GABAPENTIN 300 MG PO CAPS
300.0000 mg | ORAL_CAPSULE | Freq: Two times a day (BID) | ORAL | 0 refills | Status: DC
  Filled 2021-07-31: qty 60, 30d supply, fill #0

## 2021-07-31 MED ORDER — PREDNISONE 5 MG PO TABS
5.0000 mg | ORAL_TABLET | Freq: Every day | ORAL | 0 refills | Status: DC
  Filled 2021-07-31: qty 30, 30d supply, fill #0

## 2021-07-31 MED ORDER — FLUTICASONE PROPIONATE 50 MCG/ACT NA SUSP
1.0000 | Freq: Every day | NASAL | 0 refills | Status: AC
  Filled 2021-07-31: qty 16, 30d supply, fill #0

## 2021-07-31 MED ORDER — ZINC SULFATE 220 (50 ZN) MG PO TABS
220.0000 mg | ORAL_TABLET | Freq: Every day | ORAL | 0 refills | Status: AC
  Filled 2021-07-31: qty 30, 30d supply, fill #0

## 2021-07-31 MED ORDER — GERHARDT'S BUTT CREAM: 1.0000 "application " | TOPICAL_CREAM | Freq: Two times a day (BID) | CUTANEOUS | Status: DC

## 2021-07-31 MED ORDER — PSYLLIUM 58.12 % PO PACK
1.0000 | PACK | Freq: Every day | ORAL | 0 refills | Status: AC
  Filled 2021-07-31: qty 30, 30d supply, fill #0

## 2021-07-31 MED ORDER — ATORVASTATIN CALCIUM 80 MG PO TABS
80.0000 mg | ORAL_TABLET | Freq: Every day | ORAL | 0 refills | Status: AC
  Filled 2021-07-31: qty 30, 30d supply, fill #0

## 2021-07-31 NOTE — Discharge Summary (Signed)
Physician Discharge Summary  Patient ID: Samantha King MRN: [id number] DOB/AGE: July 16, 1946 75 y.o.  Admit date: 07/10/2021 Discharge date: 08/01/2021  Discharge Diagnoses:  Principal Problem:   Debility Active Problems:   Hypokalemia   Severe muscle deconditioning   MSSA bacteremia   Loose stools   Leukocytosis   Hyperglycemia   Acute gout   Hyponatremia   Situational anxiety   Prerenal azotemia   Abnormal LFTs   Hypoalbuminemia   Discharged Condition: good  Significant Diagnostic Studies: DG Abd 1 View  Result Date: 07/10/2021 CLINICAL DATA:  Diarrhea. EXAM: ABDOMEN - 1 VIEW COMPARISON:  06/28/2021 FINDINGS: Gas is seen within both nondilated small bowel loops and colon. IMPRESSION: Nonspecific, nonobstructive bowel gas pattern. Electronically Signed   By: Marlaine Hind M.D.   On: 07/10/2021 19:31   DG CHEST PORT 1 VIEW  Result Date: 07/16/2021 CLINICAL DATA:  Shortness of breath.  Cough. EXAM: PORTABLE CHEST 1 VIEW COMPARISON:  06/27/2021 FINDINGS: Patient rotated right. Right-sided PICC line terminates at the superior caval/atrial junction. Mild cardiomegaly. Atherosclerosis in the transverse aorta. No pleural effusion or pneumothorax. Diffuse peribronchial thickening. Mild volume loss and subsegmental atelectasis or scarring at the left lung base. IMPRESSION: No acute cardiopulmonary disease. Peribronchial thickening which may relate to chronic bronchitis or smoking. Aortic Atherosclerosis (ICD10-I70.0). Electronically Signed   By: Abigail Miyamoto M.D.   On: 07/16/2021 11:50    Labs:  Basic Metabolic Panel: BMP Latest Ref Rng & Units 07/29/2021 07/22/2021 07/16/2021  Glucose 70 - 99 mg/dL 111(H) 92 100(H)  BUN 8 - 23 mg/dL 26(H) 24(H) 25(H)  Creatinine 0.44 - 1.00 mg/dL 0.73 0.67 0.62  Sodium 135 - 145 mmol/L 133(L) 135 136  Potassium 3.5 - 5.1 mmol/L 4.6 4.2 4.1  Chloride 98 - 111 mmol/L 98 102 104  CO2 22 - 32 mmol/L 25 26 26   Calcium 8.9 - 10.3 mg/dL 9.8 9.5 9.2      CBC: CBC Latest Ref Rng & Units 07/29/2021 07/22/2021 07/18/2021  WBC 4.0 - 10.5 K/uL 13.3(H) 15.4(H) 15.7(H)  Hemoglobin 12.0 - 15.0 g/dL 11.8(L) 11.2(L) 10.5(L)  Hematocrit 36.0 - 46.0 % 37.3 35.5(L) 34.6(L)  Platelets 150 - 400 K/uL 342 414(H) 452(H)     Liver Function Tests: Hepatic Function Latest Ref Rng & Units 07/29/2021 07/22/2021 07/15/2021  Total Protein 6.5 - 8.1 g/dL 5.7(L) 5.6(L) 5.7(L)  Albumin 3.5 - 5.0 g/dL 2.9(L) 2.7(L) 2.4(L)  AST 15 - 41 U/L 50(H) 41 35  ALT 0 - 44 U/L 24 24 19   Alk Phosphatase 38 - 126 U/L 137(H) 108 108  Total Bilirubin 0.3 - 1.2 mg/dL 0.4 0.4 0.4  Bilirubin, Direct 0.0 - 0.2 mg/dL - - -    CBG: Recent Labs  Lab 07/30/21 1641 07/30/21 2112 07/31/21 0640 07/31/21 1138 07/31/21 1624  GLUCAP 89 129* 92 97 127*    Brief HPI:   ALIANIS TRIMMER is a 75 y.o. female with history of CAD s/p V. fib arrest, PAD, tobacco abuse who was originally admitted to [facility] in 06/18/2021 with reports of fevers and chills, severe back and left knee pain as well as leukocytosis secondary to sepsis from MSSA bacteremia.  Work-up done revealing L4-L5 anterolisthesis with severe canal stenosis and moderate left foraminal stenosis L3/L4.  ID expressed concerns of vertebral infection/discitis and she was started on cefazolin.  For management and of course was significant for issues with hypotension as well as A. fib with RVR, respiratory failure due to acute diastolic CHF  as well as PNA with pulmonary effusions due to pulmonary septic emboli and presumed endocarditis.  She continued to be limited by severe back pain out of proportion due to chronic pain and Dr. Graylon Good recommended IV cefazolin for 6 weeks with end date of 08/01/2021.    Her pain control was improving however she continued to have issues with hypoxia requiring high flow oxygen, trace movements of BLE, severe limitations due to pain and was discharged to [facility] on 08/16 for  management. Her respiratory status was improving and oxygen was weaned down to 4 L at rest.  She did have a rise in white count to 19.6 with MRI of thoracic: Spine showing development of marrow edema at bilateral displaced L3/4 suspicious for discitis and osteomyelitis as well as fluid collection within the left psoas suspicious for abscess.  IR was consulted for input and felt that fluid was not amicable to aspiration and was changed to Zosyn on 08/25 by ID.  She she developed severe bilateral foot pain with erythema due to gout flare and was started on short course prednisone as well as Colcrys with improvement in symptoms.  She was showing improvement in activity tolerance and participation but continued to be limited by debility.  CIR was recommended due to functional decline.   Hospital Course: ALESSANDRA SAWDEY was admitted to rehab 07/10/2021 for inpatient therapies to consist of PT and OT at least three hours five days a week. Past admission physiatrist, therapy team and rehab RN have worked together to provide customized collaborative inpatient rehab.  Infectious disease was consulted for follow-up and input on antibiotic regimen.  Dr. Juleen China recommended changing antibiotics back to cefazolin for 6 weeks from negative blood cultures with end date of 09/22.  Serial check of CBC shows leukocytosis to be resolving and anemia of chronic illness to be slowly improving.[name]  Her respiratory status and endurance have gradually improved and she has been weaned off oxygen.  BuSpar was added to help manage anxiety.  She continued to have issues with pain and neuropathy and Cymbalta was titrated to 60 mg a day and Robaxin was added to help with muscle spasms.  As pain control was improving oxycodone was weaned down to as needed prior to discharge.  UA was ordered as she reported urinary incontinence since removal of foley on 08/28 and this was negative for infection.  Voiding function was monitored with PVR checks  and has shown improvement.  She is currently voiding without retention.  Fiber was added to help manage loose stools felt to be due to side effects of antibiotics. Her blood pressures were monitored on TID basis and have been controlled.  K-Dur was added for supplementation due to hypokalemia secondary to diuretics.  Serial check of electrolytes showed prerenal azotemia and she was encouraged to increase fluid intake.  LFTs have been monitored and show mild elevation without any GI symptoms.  Recommend repeat CMET on in 1 to 2 weeks and follow-up with PCP or ID. MASD on sacrum is decreasing and has almost resolved.     Her diet was liberalized to regular to help with food choices and intake. Family was also advised to bring food or supplements from home to help with intake.  She did have recurrent flare up of gout requiring increase in prednisone with slow taper down to 5 mg/day by 09/20.  She has had hyperglycemia due to steroids and BS were monitored with ac/hs checks with SSI prn. Her BS are  normalizing with taper of steroids and anticipate will continue to improve off prednisone.  She has also had issues with flushing and left cheek erythema that she felt was related to hot flashes.  She completed her antibiotic course by 09/22 without significant side effects.  She has made steady progress and is currently at modified independent at wheelchair level.   She will continue to receive follow-up home health PT and OT by [facility] after discharge.   Rehab course: During patient's stay in rehab weekly team conferences were held to monitor patient's progress, set goals and discuss barriers to discharge. At admission, patient required max to total assist with ADL tasks and max assist with mobility. She  has had improvement in activity tolerance, balance, postural control as well as ability to compensate for deficits. She is able to complete ADL tasks with supervision. She is modified independent for  transfers and requires supervision with cues to ambulate 150' with rollater. Family education was completed with husband.    Disposition: Home  Diet: Regular.   Special Instructions: 1.  No driving or strenuous activity till cleared by MD. 2.  Recommend repeat CMET in 1 to 2 weeks to monitor electrolytes and LFTs.  Discharge Instructions     Ambulatory referral to Physical Medicine Rehab   Complete by: As directed    Hospital follow up      Allergies as of 08/01/2021       Reactions   Shellfish Allergy Nausea And Vomiting   Codeine Other (See Comments)   Makes me hyperactive and not able to sleep   Lidocaine Palpitations        Medication List     STOP taking these medications    arformoterol 15 MCG/2ML Nebu Commonly known as: BROVANA   ceFAZolin  IVPB Commonly known as: ANCEF   Chlorhexidine Gluconate Cloth 2 % Pads   clonazePAM 0.25 MG disintegrating tablet Commonly known as: KLONOPIN   escitalopram 5 MG tablet Commonly known as: LEXAPRO   furosemide 10 MG/ML injection Commonly known as: LASIX Replaced by: furosemide 40 MG tablet   heparin 5000 UNIT/ML injection   ketorolac 15 MG/ML injection Commonly known as: TORADOL   ondansetron 4 MG/2ML Soln injection Commonly known as: ZOFRAN   revefenacin 175 MCG/3ML nebulizer solution Commonly known as: YUPELRI   sodium chloride 0.9 % infusion   sodium chloride flush 0.9 % Soln Commonly known as: NS       TAKE these medications    acetaminophen 325 MG tablet Commonly known as: TYLENOL Take 650 mg by mouth every 6 (six) hours as needed for mild pain or headache. For headache or pain   atorvastatin 80 MG tablet Commonly known as: LIPITOR Take 1 tablet (80 mg total) by mouth daily at 6 PM.   budesonide-formoterol 160-4.5 MCG/ACT inhaler Commonly known as: Symbicort Inhale 2 puffs into the lungs 2 (two) times daily.   busPIRone 5 MG tablet Commonly known as: BUSPAR Take 1 tablet (5 mg total)  by mouth 3 (three) times daily.   clopidogrel 75 MG tablet Commonly known as: PLAVIX Take 1 tablet (75 mg total) by mouth daily.   colchicine 0.6 MG tablet Take 1 tablet (0.6 mg total) by mouth daily.   diclofenac Sodium 1 % Gel Commonly known as: VOLTAREN Apply 4 g topically 4 (four) times daily. To both legs What changed: additional instructions   DULoxetine 60 MG capsule Commonly known as: CYMBALTA Take 1 capsule (60 mg total) by mouth daily.  What changed:  medication strength how much to take   fluticasone 50 MCG/ACT nasal spray Commonly known as: FLONASE Place 1 spray into both nostrils daily.   furosemide 40 MG tablet Commonly known as: LASIX Take 1 tablet (40 mg total) by mouth daily. Replaces: furosemide 10 MG/ML injection   gabapentin 300 MG capsule Commonly known as: NEURONTIN Take 1 capsule (300 mg total) by mouth 2 (two) times daily. What changed:  medication strength how much to take when to take this   Gerhardt's butt cream Crea Apply 1 application topically 2 (two) times daily. Use desitin or zinc sulfate   lidocaine 5 % Commonly known as: LIDODERM Place 1 patch onto the skin daily. Apply to lower back at 7 am and remove at 7 pm daily. What changed: additional instructions   loratadine 10 MG tablet Commonly known as: CLARITIN Take 1 tablet (10 mg total) by mouth daily.   Metamucil [facility] Fiber 58.12 % Pack Generic drug: Psyllium Mix 1 packet in liquid and drink daily.   methocarbamol 750 MG tablet Commonly known as: ROBAXIN Take 1 tablet (750 mg total) by mouth every 6 (six) hours as needed for muscle spasms.   metoprolol tartrate 25 MG tablet Commonly known as: LOPRESSOR Take 1 tablet (25 mg total) by mouth 2 (two) times daily. What changed: Another medication with the same name was removed. Continue taking this medication, and follow the directions you see here.   oxyCODONE 5 MG immediate release tablet--Rx # 15 pills. Commonly  known as: Oxy IR/ROXICODONE Take 1 tablet (5 mg total) by mouth every 6 (six) hours as needed for severe pain. What changed:  when to take this reasons to take this Notes to patient: Limit to 2 pills per day as needed for severe pain   pantoprazole 40 MG tablet Commonly known as: PROTONIX Take 1 tablet (40 mg total) by mouth daily.   potassium chloride SA 20 MEQ tablet Commonly known as: KLOR-CON Take 1 tablet (20 mEq total) by mouth 2 (two) times daily. What changed: how much to take   predniSONE 5 MG tablet Commonly known as: DELTASONE Take 1 tablet (5 mg total) by mouth daily with breakfast.   vitamin C 500 MG tablet Commonly known as: ASCORBIC ACID Take 1 tablet (500 mg total) by mouth 2 (two) times daily.   Zinc Sulfate 220 (50 Zn) MG Tabs Take 1 tablet (220 mg total) by mouth daily.        Follow-up Information     Tisovec, Fransico Him, MD. Call.   Specialty: Internal Medicine Why: for post hospital follow up Contact information: [place] [address] [phone]         Burnell Blanks, MD .   Specialty: Cardiology Contact information: [address] [place] [address] [phone]         Courtney Heys, MD Follow up.   Specialty: Physical Medicine and Rehabilitation Contact information: [address] [place] [address] [phone]         [name], MD. Go on 08/06/2021.   Specialty: Infectious Diseases Why: appointment at [facility] information: [address] [place] [address] [phone]                 Signed: Bary Leriche 08/05/2021, 6:55 PM

## 2021-07-31 NOTE — Progress Notes (Signed)
Occupational Therapy Discharge Summary  Patient Details  Name: Samantha King MRN: [id number] Date of Birth: February 05, 1946   Patient has met 10 of 10 long term goals due to improved activity tolerance, improved balance, ability to compensate for deficits, and improved coordination.  Patient to discharge at overall Supervision level.  Patient's care partner is independent to provide the necessary physical assistance at discharge.    Pt has made excellent progress since admission and continues to show improvements with her balance and endurance but continues to rely heavily on the RW for support in standing with homemaking tasks.   Pt's spouse came for family education during physical therapy to see how she ambulates and transfers.  He did not come during OT sessions, but pt is competent to direct her own care as needed and explain things to him.  They both understand that HHOT will need to practice shower transfers with patient first to ensure clamp on grab bars are working and the right locations.    Reasons goals not met: n/a  Recommendation:  Patient will benefit from ongoing skilled OT services in home health setting to continue to advance functional skills in the area of BADL and iADL.  Equipment: BSC (pt also purchased on her own a shower seat with handles)  Reasons for discharge: treatment goals met  Patient/family agrees with progress made and goals achieved: Yes  OT Discharge Precautions/Restrictions  Precautions Precautions: Fall   ADL ADL Eating: Independent Grooming: Supervision/safety Where Assessed-Grooming: Standing at sink Upper Body Bathing: Supervision/safety Where Assessed-Upper Body Bathing: Shower Lower Body Bathing: Supervision/safety Where Assessed-Lower Body Bathing: Shower Upper Body Dressing: Setup Lower Body Dressing: Supervision/safety Where Assessed-Lower Body Dressing: Chair Toileting: Supervision/safety Where Assessed-Toileting: Contractor: Close supervision Armed forces technical officer Method: Counselling psychologist: Emergency planning/management officer Transfer: Curator Method: Heritage manager: Grab bars, Radio broadcast assistant Vision Baseline Vision/History: 1 Wears glasses Wears Glasses: At all times Patient Visual Report: No change from baseline Vision Assessment?: No apparent visual deficits Perception  Perception: Within Functional Limits Praxis Praxis: Intact Cognition Overall Cognitive Status: Within Functional Limits for tasks assessed Orientation Level: Oriented X4 Year: 2022 Month: September Day of Week: Correct Memory: Appears intact Immediate Memory Recall: Sock;Blue;Bed Memory Recall Sock: Without Cue Memory Recall Blue: Without Cue Memory Recall Bed: Without Cue Awareness: Appears intact Problem Solving: Appears intact Safety/Judgment: Appears intact Sensation Sensation Light Touch: Appears Intact Hot/Cold: Appears Intact Proprioception: Appears Intact Stereognosis: Appears Intact Coordination Gross Motor Movements are Fluid and Coordinated: Yes Fine Motor Movements are Fluid and Coordinated: Yes Finger Nose Finger Test: [facility] Heel Shin Test: [facility] Motor  Motor Motor: Within Functional Limits Motor - Discharge Observations: Limited by pain in low back Mobility  Bed Mobility Bed Mobility: Rolling Right;Rolling Left;Supine to Sit;Sitting - Scoot to [employer] of Bed;Sit to Supine Rolling Right: Independent with assistive device (Flat HOB, bedrail) Rolling Left: Independent with assistive device (Flat HOB, bedrail) Supine to Sit: Independent with assistive device (Flat HOB, bedrail) Sitting - Scoot to Edge of Bed: Independent with assistive device (Flat HOB, bedrail) Sit to Supine: Independent with assistive device (Flat HOB, bedrail) Transfers Sit to Stand: Supervision/Verbal cueing (RW) Stand to Sit: Supervision/Verbal cueing  Trunk/Postural  Assessment  Cervical Assessment Cervical Assessment: Within Functional Limits Thoracic Assessment Thoracic Assessment: Exceptions to [facility] (Kyphotic) Lumbar Assessment Lumbar Assessment: Exceptions to [facility] (Posterior pelvic tilt) Postural Control Postural Control: Within Functional Limits  Balance Balance Balance Assessed: Yes Static Sitting Balance  Static Sitting - Balance Support: Feet supported Static Sitting - Level of Assistance: 7: Independent Dynamic Sitting Balance Dynamic Sitting - Balance Support: Feet supported;During functional activity Dynamic Sitting - Level of Assistance: 7: Independent Static Standing Balance Static Standing - Balance Support: During functional activity;Bilateral upper extremity supported Static Standing - Level of Assistance: 6: Modified independent (Device/Increase time) Dynamic Standing Balance Dynamic Standing - Balance Support: During functional activity;Bilateral upper extremity supported Dynamic Standing - Level of Assistance: 5: Stand by assistance Extremity/Trunk Assessment RUE Assessment RUE Assessment: Within Functional Limits LUE Assessment LUE Assessment: Within Functional Limits   Samantha King 07/31/2021, 12:29 PM

## 2021-07-31 NOTE — Progress Notes (Signed)
Physical Therapy Session Note  Patient Details  Name: FALICITY SHEETS MRN: [id number] Date of Birth: 02/20/1946  Today's Date: 07/31/2021 PT Individual Time: 1330-1445 PT Individual Time Calculation (min): 75 min   Short Term Goals: Week 3:  PT Short Term Goal 1 (Week 3): STG = LTG due to LOS  Skilled Therapeutic Interventions/Progress Updates:    Pt received seated in w/c in room, agreeable to PT session. No complaints of pain. Sit to stand at mod I level with rollator throughout session, pt demos good safety awareness and safe brake management. Ambulation up to 1000 ft during session with use of rollator at Supervision to mod I level, several seated rest breaks throughout ambulation. Ambulation up/down incline and across uneven ground with rollator at Supervision to simulate home environment. Education with patient during session about importance of energy conservation, taking breaks as needed during functional activity while also performing walking program to improve endurance.Toilet transfer at mod I level with rollator. Pt left seated in w/c in room with needs in reach, husband present at end of session.  Therapy Documentation Precautions:  Precautions Precautions: Fall Restrictions Weight Bearing Restrictions: No     Therapy/Group: Individual Therapy   Excell Seltzer, PT, DPT, CSRS  07/31/2021, 5:25 PM

## 2021-07-31 NOTE — Progress Notes (Signed)
Inpatient Rehabilitation Care Coordinator Discharge Note   Patient Details  Name: Samantha King MRN: [id number] Date of Birth: 29-Dec-1945   Discharge location: HOME WITH HUSBAND WHO CAN PROVIDE 24 HR SUPERVISION  Length of Stay: 22 DAYS  Discharge activity level: [place]  Home/community participation: YES  Patient response LM:BEMLJQ Literacy - How often do you need to have someone help you when you read instructions, pamphlets, or other written material from your doctor or pharmacy?: Never  Patient response GB:EEFEOF Isolation - How often do you feel lonely or isolated from those around you?: Never  Services provided included: MD, RD, PT, OT, SLP, RN, CM, Pharmacy, SW  Financial Services:  Financial Services Utilized: Herbalist  Choices offered to/list presented to: PT AND HUSBAND  Follow-up services arranged:  Home Health, Patient/Family has no preference for HH/DME agencies [place]: [place]        Patient response to transportation need: Is the patient able to respond to transportation needs?: Yes In the past 12 months, has lack of transportation kept you from medical appointments or from getting medications?: No In the past 12 months, has lack of transportation kept you from meetings, work, or from getting things needed for daily living?: No    Comments (or additional information):PT DID EXTREMELY WELL AND EXCEEDED HER GOALS. HUSBAND WAS IN AND IS COMFORTABLE WITH HER CARE AND BOTH READY TO GO HOME.  Patient/Family verbalized understanding of follow-up arrangements:  Yes  Individual responsible for coordination of the follow-up plan: HAROLD-HUSBAND  [phone]  Confirmed correct DME delivered: Elease Hashimoto 07/31/2021    Penina Reisner, Gardiner Rhyme

## 2021-07-31 NOTE — Progress Notes (Signed)
PROGRESS NOTE   Subjective/Complaints: Patient seen sitting up in her chair this morning working with therapies.  She states she slept well overnight.  She states she would like to DC her oxycodone.  She has questions regarding discharge medications.  ROS: Denies CP, SOB, N/V/D  Objective:   No results found. Recent Labs    07/29/21 0334  WBC 13.3*  HGB 11.8*  HCT 37.3  PLT 342     Recent Labs    07/29/21 0334  NA 133*  K 4.6  CL 98  CO2 25  GLUCOSE 111*  BUN 26*  CREATININE 0.73  CALCIUM 9.8       Intake/Output Summary (Last 24 hours) at 07/31/2021 1036 Last data filed at 07/31/2021 0815 Gross per 24 hour  Intake 660 ml  Output --  Net 660 ml         Physical Exam: Vital Signs Blood pressure 123/77, pulse 66, temperature 97.6 F (36.4 C), temperature source Oral, resp. rate 18, height 5\' 3"  (1.6 m), weight 77.3 kg, SpO2 92 %. Constitutional: No distress . Vital signs reviewed. HENT: Normocephalic.  Atraumatic. Eyes: EOMI. No discharge. Cardiovascular: No JVD.  RRR. Respiratory: Normal effort.  No stridor.  Bilateral clear to auscultation. GI: Non-distended.  BS +. Skin: Warm and dry.  Intact. Psych: Normal mood.  Normal behavior. Musc: No edema in extremities.  No tenderness in extremities. Neuro: Alert and oriented Motor 5/5 UE and 4-4+/5 LE's, stable  Assessment/Plan: 1. Functional deficits which require 3+ hours per day of interdisciplinary therapy in a comprehensive inpatient rehab setting. Physiatrist is providing close team supervision and 24 hour management of active medical problems listed below. Physiatrist and rehab team continue to assess barriers to discharge/monitor patient progress toward functional and medical goals  Care Tool:  Bathing    Body parts bathed by patient: Right arm, Left arm, Chest, Abdomen, Front perineal area, Face, Right upper leg, Left upper leg, Buttocks,  Right lower leg, Left lower leg   Body parts bathed by helper: Right lower leg, Left lower leg, Buttocks, Face     Bathing assist Assist Level: Supervision/Verbal cueing     Upper Body Dressing/Undressing Upper body dressing   What is the patient wearing?: Pull over shirt    Upper body assist Assist Level: Set up assist    Lower Body Dressing/Undressing Lower body dressing      What is the patient wearing?: Underwear/pull up, Pants     Lower body assist Assist for lower body dressing: Supervision/Verbal cueing     Toileting Toileting    Toileting assist Assist for toileting: Supervision/Verbal cueing     Transfers Chair/bed transfer  Transfers assist  Chair/bed transfer activity did not occur: Safety/medical concerns  Chair/bed transfer assist level: Supervision/Verbal cueing     Locomotion Ambulation   Ambulation assist   Ambulation activity did not occur: Safety/medical concerns  Assist level: Supervision/Verbal cueing Assistive device: Rollator Max distance: 150'   Walk 10 feet activity   Assist  Walk 10 feet activity did not occur: Safety/medical concerns  Assist level: Supervision/Verbal cueing Assistive device: Rollator   Walk 50 feet activity   Assist Walk 50 feet with  2 turns activity did not occur: Safety/medical concerns  Assist level: Supervision/Verbal cueing Assistive device: Rollator    Walk 150 feet activity   Assist Walk 150 feet activity did not occur: Safety/medical concerns  Assist level: Supervision/Verbal cueing Assistive device: Rollator    Walk 10 feet on uneven surface  activity   Assist Walk 10 feet on uneven surfaces activity did not occur: Safety/medical concerns   Assist level: Supervision/Verbal cueing Assistive device: Rollator   Wheelchair     Assist Is the patient using a wheelchair?: Yes Type of Wheelchair: Manual Wheelchair activity did not occur: Safety/medical concerns  Wheelchair  assist level: Set up assist, Supervision/Verbal cueing Max wheelchair distance: >150'    Wheelchair 50 feet with 2 turns activity    Assist    Wheelchair 50 feet with 2 turns activity did not occur: Safety/medical concerns   Assist Level: Supervision/Verbal cueing   Wheelchair 150 feet activity     Assist  Wheelchair 150 feet activity did not occur: Safety/medical concerns   Assist Level: Supervision/Verbal cueing    Medical Problem List and Plan: 1.  Deficits with mobility, endurance, self-care secondary to debility from L3-4 osteomyelitis/discitis/septic shock/septic emboli/presumed endocarditis Also with L4-5 lumbar stenosis - severe   Continue CIR, pt and family education 2.  Antithrombotics: -DVT/anticoagulation:  Pharmaceutical: Lovenox             -antiplatelet therapy: Plavix 3. Left buttock pain/back pain- : Oxycodone prn, DC'd on 9/21 per patient request             Continue Cymbalta and Gabapentin.              Monitor with increased exertion  increased cymbalta to 60 mg daily  Robaxin to 1000 mg q6 hours prn, decreased to 750 on 9/8.  Added voltaren gel QID for legs Controlled on 9/21 4. Mood: LCSW to follow for evaluation and support.   Started Buspar 5 mg TID for anxiety on 9/6.              -antipsychotic agents: N/a 5. Neuropsych: This patient is capable of making decisions on her own behalf. 6. Skin/Wound Care: Routine pressure relief measure. Foam dressing to sacrum 7. Fluids/Electrolytes/Nutrition: Intake poor --does not like the food here.             Liberalize diet to regular for flavor.   -encourage appropriate fluids 8. MSSA bacteremia w/sepsis:  Zosyn changed to Ancef through 9/22 9. Diskitis: Likely Tricuspid valve as source given pulm infectious nodules , TTE no vegetation , no TEE done.             - Appreciate ID recs  10. COPD/Pulmonary septic emboli: continue Ancef-encourage pulmonary toilet with flutter valve             --continue  Incurse inhaler daily             Wean supplemental oxygen as tolerated, no longer requires 11. Gout flare: start prednisone taper down to 15mg  daily on 9/12, decreased to 10 on 9/17, decreased to 5 on 9/20, plan to DC later this week 12. Hypokalemia:  . Added supplement as Lasix on board. Potassium 4.6 on 9/19 Supplementing 13. Neurogenic bladder v/s overflow: retention resolved  14.  Loose stools w/incontinence/Neurogenic bowel?: Goes after she eats as well as prn.             --discontinued Miralax --KUB only with gas -no stools recorded yet -continue daily fiber 15. Obesity BMI 30.20:  provide dietary counseling.  16. Leukocytosis, likely d/t steroids   Urine culture suggesting multiple species  WBCs 13.3 on 9/19  Afebrile, no signs/symptoms of infection 17.  Left cheek erythema/edema  Patient states this is a intermittent and sporadic problem that has been happening for >50 years, where her left cheek becomes erythematous and edematous and spontaneously resolves and then recurs a few days later in the cycle  Encourage diary 18.  Hyperglycemia- well controlled -dc cbg's  Continue to monitor in accordance with steroid wean  LOS: 21 days A FACE TO FACE EVALUATION WAS PERFORMED  Aldrick Derrig Lorie Phenix 07/31/2021, 10:36 AM

## 2021-07-31 NOTE — Progress Notes (Addendum)
Occupational Therapy Session Note  Patient Details  Name: Samantha King MRN: [id number] Date of Birth: Mar 19, 1946  Today's Date: 07/31/2021 OT Individual Time: 0830-0930 OT Individual Time Calculation (min): 60 min    Short Term Goals: Week 3:  OT Short Term Goal 1 (Week 3): STGs = LTGs  Skilled Therapeutic Interventions/Progress Updates:    Pt received in w/c, connected to IV for her treatment. Pt was unable to be disconnected for self care, but pt stated she did not need another shower today and felt clean and ready for the day.  Pt completed all of the self care task yesterday with supervision.  Today the focus of therapy was on teaching pt and having her practice her HEP for her UE.  Pt given 5 sets of exercises to do and then she is to repeat her each set 2 more times. Pt has exercises for shoulders (pilates arm circles), triceps (chair push ups and band punches), upper back and mid back with band pulls.   Pt completed all exercises well and she stated she feels ready to go home tomorrow.   Pt set up at sink so she could work on her makeup and hair care.  Therapy Documentation Precautions:  Precautions Precautions: Fall Restrictions Weight Bearing Restrictions: No   Pain: no c/o pain in sitting   ADL: ADL Eating: Independent Grooming: Supervision/safety Where Assessed-Grooming: Standing at sink Upper Body Bathing: Supervision/safety Where Assessed-Upper Body Bathing: Shower Lower Body Bathing: Supervision/safety Where Assessed-Lower Body Bathing: Shower Upper Body Dressing: Setup Lower Body Dressing: Supervision/safety Where Assessed-Lower Body Dressing: Chair Toileting: Supervision/safety Where Assessed-Toileting: Glass blower/designer: Close supervision Armed forces technical officer Method: Counselling psychologist: Emergency planning/management officer Transfer: Curator Method: Heritage manager: Grab bars, Transfer tub  bench    Therapy/Group: Individual Therapy  [place] 07/31/2021, 12:15 PM

## 2021-08-01 ENCOUNTER — Other Ambulatory Visit (HOSPITAL_COMMUNITY): Payer: Self-pay

## 2021-08-01 DIAGNOSIS — B9561 Methicillin susceptible Staphylococcus aureus infection as the cause of diseases classified elsewhere: Secondary | ICD-10-CM | POA: Diagnosis not present

## 2021-08-01 DIAGNOSIS — R7881 Bacteremia: Secondary | ICD-10-CM | POA: Diagnosis not present

## 2021-08-01 DIAGNOSIS — R5381 Other malaise: Secondary | ICD-10-CM | POA: Diagnosis not present

## 2021-08-01 DIAGNOSIS — R739 Hyperglycemia, unspecified: Secondary | ICD-10-CM | POA: Diagnosis not present

## 2021-08-01 LAB — GLUCOSE, CAPILLARY
Glucose-Capillary: 99 mg/dL (ref 70–99)
Glucose-Capillary: 109 mg/dL — ABNORMAL HIGH (ref 70–99)

## 2021-08-01 MED ORDER — FLUTICASONE-SALMETEROL 230-21 MCG/ACT IN AERO
2.0000 | INHALATION_SPRAY | Freq: Two times a day (BID) | RESPIRATORY_TRACT | 12 refills | Status: DC
  Filled 2021-08-01: qty 8, 30d supply, fill #0

## 2021-08-01 MED ORDER — OXYCODONE HCL 5 MG PO TABS
5.0000 mg | ORAL_TABLET | Freq: Four times a day (QID) | ORAL | 0 refills | Status: DC | PRN
  Filled 2021-08-01: qty 15, 4d supply, fill #0

## 2021-08-01 MED ORDER — BUDESONIDE-FORMOTEROL FUMARATE 160-4.5 MCG/ACT IN AERO: 2.0000 | INHALATION_SPRAY | Freq: Two times a day (BID) | RESPIRATORY_TRACT | 12 refills | Status: AC

## 2021-08-01 MED ORDER — BUDESONIDE-FORMOTEROL FUMARATE 160-4.5 MCG/ACT IN AERO
2.0000 | INHALATION_SPRAY | Freq: Every day | RESPIRATORY_TRACT | 12 refills | Status: DC
  Filled 2021-08-01: qty 10.2, 30d supply, fill #0

## 2021-08-01 NOTE — Progress Notes (Signed)
PA discussed discharge instructions with family/pt. Pt and family in agreement. Questions answered.Pt PICC removed. No complications noted. Pt belongings gathered and left per wheelchair to private vehicle. Sheela Stack, LPN

## 2021-08-01 NOTE — Progress Notes (Signed)
PROGRESS NOTE   Subjective/Complaints: No new issues. Getting packed up for home! Thankful for help of rehab team  ROS: Patient denies fever, rash, sore throat, blurred vision, nausea, vomiting, diarrhea, cough, shortness of breath or chest pain,  headache, or mood change.   Objective:   No results found. No results for input(s): WBC, HGB, HCT, PLT in the last 72 hours.  No results for input(s): NA, K, CL, CO2, GLUCOSE, BUN, CREATININE, CALCIUM in the last 72 hours.    Intake/Output Summary (Last 24 hours) at 08/01/2021 1017 Last data filed at 08/01/2021 6270 Gross per 24 hour  Intake 820 ml  Output --  Net 820 ml        Physical Exam: Vital Signs Blood pressure 122/76, pulse 72, temperature 98 F (36.7 C), resp. rate 18, height 5\' 3"  (1.6 m), weight 77.3 kg, SpO2 97 %. Constitutional: No distress . Vital signs reviewed. HEENT: NCAT, EOMI, oral membranes moist Neck: supple Cardiovascular: RRR without murmur. No JVD    Respiratory/Chest: CTA Bilaterally without wheezes or rales. Normal effort    GI/Abdomen: BS +, non-tender, non-distended Ext: no clubbing, cyanosis, or edema Psych: pleasant and cooperative  Skin: Warm and dry.  Intact. Psych: Normal mood.  Normal behavior. Musc: No edema in extremities.  No tenderness in extremities. Neuro: Alert and oriented Motor 5/5 UE and 4-4+/5 LE's, stable  Assessment/Plan: 1. Functional deficits which require 3+ hours per day of interdisciplinary therapy in a comprehensive inpatient rehab setting. Physiatrist is providing close team supervision and 24 hour management of active medical problems listed below. Physiatrist and rehab team continue to assess barriers to discharge/monitor patient progress toward functional and medical goals  Care Tool:  Bathing    Body parts bathed by patient: Right arm, Left arm, Chest, Abdomen, Front perineal area, Face, Right upper leg,  Left upper leg, Buttocks, Right lower leg, Left lower leg   Body parts bathed by helper: Right lower leg, Left lower leg, Buttocks, Face     Bathing assist Assist Level: Supervision/Verbal cueing     Upper Body Dressing/Undressing Upper body dressing   What is the patient wearing?: Pull over shirt    Upper body assist Assist Level: Set up assist    Lower Body Dressing/Undressing Lower body dressing      What is the patient wearing?: Underwear/pull up, Pants     Lower body assist Assist for lower body dressing: Supervision/Verbal cueing     Toileting Toileting    Toileting assist Assist for toileting: Supervision/Verbal cueing     Transfers Chair/bed transfer  Transfers assist  Chair/bed transfer activity did not occur: Safety/medical concerns  Chair/bed transfer assist level: Independent with assistive device Chair/bed transfer assistive device: Other Agricultural consultant)   Locomotion Ambulation   Ambulation assist   Ambulation activity did not occur: Safety/medical concerns  Assist level: Supervision/Verbal cueing Assistive device: Rollator Max distance: 150'   Walk 10 feet activity   Assist  Walk 10 feet activity did not occur: Safety/medical concerns  Assist level: Supervision/Verbal cueing Assistive device: Rollator   Walk 50 feet activity   Assist Walk 50 feet with 2 turns activity did not occur: Safety/medical concerns  Assist  level: Supervision/Verbal cueing Assistive device: Rollator    Walk 150 feet activity   Assist Walk 150 feet activity did not occur: Safety/medical concerns  Assist level: Supervision/Verbal cueing Assistive device: Rollator    Walk 10 feet on uneven surface  activity   Assist Walk 10 feet on uneven surfaces activity did not occur: Safety/medical concerns   Assist level: Supervision/Verbal cueing Assistive device: Rollator   Wheelchair     Assist Is the patient using a wheelchair?: No Type of Wheelchair:  Manual Wheelchair activity did not occur: Safety/medical concerns  Wheelchair assist level: Set up assist, Supervision/Verbal cueing Max wheelchair distance: >150'    Wheelchair 50 feet with 2 turns activity    Assist    Wheelchair 50 feet with 2 turns activity did not occur: Safety/medical concerns   Assist Level: Supervision/Verbal cueing   Wheelchair 150 feet activity     Assist  Wheelchair 150 feet activity did not occur: Safety/medical concerns   Assist Level: Supervision/Verbal cueing    Medical Problem List and Plan: 1.  Deficits with mobility, endurance, self-care secondary to debility from L3-4 osteomyelitis/discitis/septic shock/septic emboli/presumed endocarditis Also with L4-5 lumbar stenosis - severe   Dc home today  -f/u with [facility], PCP, etc  -HH with [facility] 2.  Antithrombotics: -DVT/anticoagulation:  Pharmaceutical: Lovenox             -antiplatelet therapy: Plavix 3. Left buttock pain/back pain- : Oxycodone prn, DC'd on 9/21 per patient request, but she told me today she would like a few just in case pain flares.             Continue Cymbalta and Gabapentin.              Robaxin to 1000 mg q6 hours prn, decreased to 750 on 9/8.   voltaren gel QID for legs Controlled on 9/22 4. Mood:    Started Buspar 5 mg TID for anxiety on 9/6.              -antipsychotic agents: N/a 5. . MSSA bacteremia w/sepsis:  Zosyn changed to Ancef through 9/22 6. Diskitis: Likely Tricuspid valve as source given pulm infectious nodules , TTE no vegetation , no TEE done.             - Appreciate ID recs  10. COPD/Pulmonary septic emboli: continue Ancef-encourage pulmonary toilet with flutter valve             --continue Incurse inhaler daily               11. Gout flare: responded to prednisone taper 12. Hypokalemia:  . Added supplement as Lasix on board. Potassium 4.6 on 9/19 Supplementing 13. Neurogenic bladder v/s overflow: retention resolved  14.  Loose stools  w/incontinence/Neurogenic bowel?:   -daily fiber    LOS: 22 days A FACE TO FACE EVALUATION WAS PERFORMED  Samantha King 08/01/2021, 10:17 AM

## 2021-08-01 NOTE — Plan of Care (Signed)
  Problem: RH Balance Goal: LTG Patient will maintain dynamic sitting balance (PT) Description: LTG:  Patient will maintain dynamic sitting balance with assistance during mobility activities (PT) Outcome: Completed/Met Goal: LTG Patient will maintain dynamic standing balance (PT) Description: LTG:  Patient will maintain dynamic standing balance with assistance during mobility activities (PT) Outcome: Completed/Met   Problem: Sit to Stand Goal: LTG:  Patient will perform sit to stand with assistance level (PT) Description: LTG:  Patient will perform sit to stand with assistance level (PT) Outcome: Completed/Met   Problem: RH Bed Mobility Goal: LTG Patient will perform bed mobility with assist (PT) Description: LTG: Patient will perform bed mobility with assistance, with/without cues (PT). Outcome: Completed/Met   Problem: RH Bed to Chair Transfers Goal: LTG Patient will perform bed/chair transfers w/assist (PT) Description: LTG: Patient will perform bed to chair transfers with assistance (PT). Outcome: Completed/Met   Problem: RH Car Transfers Goal: LTG Patient will perform car transfers with assist (PT) Description: LTG: Patient will perform car transfers with assistance (PT). Outcome: Completed/Met   Problem: RH Ambulation Goal: LTG Patient will ambulate in controlled environment (PT) Description: LTG: Patient will ambulate in a controlled environment, # of feet with assistance (PT). Outcome: Completed/Met Goal: LTG Patient will ambulate in home environment (PT) Description: LTG: Patient will ambulate in home environment, # of feet with assistance (PT). Outcome: Completed/Met   Problem: RH Wheelchair Mobility Goal: LTG Patient will propel w/c in controlled environment (PT) Description: LTG: Patient will propel wheelchair in controlled environment, # of feet with assist (PT) Outcome: Completed/Met Goal: LTG Patient will propel w/c in home environment (PT) Description: LTG:  Patient will propel wheelchair in home environment, # of feet with assistance (PT). Outcome: Completed/Met   Problem: RH Stairs Goal: LTG Patient will ambulate up and down stairs w/assist (PT) Description: LTG: Patient will ambulate up and down # of stairs with assistance (PT) Outcome: Completed/Met   

## 2021-08-02 DIAGNOSIS — R159 Full incontinence of feces: Secondary | ICD-10-CM | POA: Diagnosis not present

## 2021-08-02 DIAGNOSIS — K5790 Diverticulosis of intestine, part unspecified, without perforation or abscess without bleeding: Secondary | ICD-10-CM | POA: Diagnosis not present

## 2021-08-02 DIAGNOSIS — M48061 Spinal stenosis, lumbar region without neurogenic claudication: Secondary | ICD-10-CM | POA: Diagnosis not present

## 2021-08-02 DIAGNOSIS — B9561 Methicillin susceptible Staphylococcus aureus infection as the cause of diseases classified elsewhere: Secondary | ICD-10-CM | POA: Diagnosis not present

## 2021-08-02 DIAGNOSIS — Z8701 Personal history of pneumonia (recurrent): Secondary | ICD-10-CM | POA: Diagnosis not present

## 2021-08-02 DIAGNOSIS — I252 Old myocardial infarction: Secondary | ICD-10-CM | POA: Diagnosis not present

## 2021-08-02 DIAGNOSIS — N200 Calculus of kidney: Secondary | ICD-10-CM | POA: Diagnosis not present

## 2021-08-02 DIAGNOSIS — I2690 Septic pulmonary embolism without acute cor pulmonale: Secondary | ICD-10-CM | POA: Diagnosis not present

## 2021-08-02 DIAGNOSIS — I2510 Atherosclerotic heart disease of native coronary artery without angina pectoris: Secondary | ICD-10-CM | POA: Diagnosis not present

## 2021-08-02 DIAGNOSIS — Z951 Presence of aortocoronary bypass graft: Secondary | ICD-10-CM | POA: Diagnosis not present

## 2021-08-02 DIAGNOSIS — M4316 Spondylolisthesis, lumbar region: Secondary | ICD-10-CM | POA: Diagnosis not present

## 2021-08-02 DIAGNOSIS — I700 Atherosclerosis of aorta: Secondary | ICD-10-CM | POA: Diagnosis not present

## 2021-08-02 DIAGNOSIS — K279 Peptic ulcer, site unspecified, unspecified as acute or chronic, without hemorrhage or perforation: Secondary | ICD-10-CM | POA: Diagnosis not present

## 2021-08-02 DIAGNOSIS — Z87891 Personal history of nicotine dependence: Secondary | ICD-10-CM | POA: Diagnosis not present

## 2021-08-02 DIAGNOSIS — M109 Gout, unspecified: Secondary | ICD-10-CM | POA: Diagnosis not present

## 2021-08-02 DIAGNOSIS — Z7902 Long term (current) use of antithrombotics/antiplatelets: Secondary | ICD-10-CM | POA: Diagnosis not present

## 2021-08-02 DIAGNOSIS — E785 Hyperlipidemia, unspecified: Secondary | ICD-10-CM | POA: Diagnosis not present

## 2021-08-02 DIAGNOSIS — Z7951 Long term (current) use of inhaled steroids: Secondary | ICD-10-CM | POA: Diagnosis not present

## 2021-08-02 DIAGNOSIS — N319 Neuromuscular dysfunction of bladder, unspecified: Secondary | ICD-10-CM | POA: Diagnosis not present

## 2021-08-02 DIAGNOSIS — I071 Rheumatic tricuspid insufficiency: Secondary | ICD-10-CM | POA: Diagnosis not present

## 2021-08-02 DIAGNOSIS — M4626 Osteomyelitis of vertebra, lumbar region: Secondary | ICD-10-CM | POA: Diagnosis not present

## 2021-08-02 DIAGNOSIS — M4646 Discitis, unspecified, lumbar region: Secondary | ICD-10-CM | POA: Diagnosis not present

## 2021-08-02 DIAGNOSIS — E279 Disorder of adrenal gland, unspecified: Secondary | ICD-10-CM | POA: Diagnosis not present

## 2021-08-02 DIAGNOSIS — J432 Centrilobular emphysema: Secondary | ICD-10-CM | POA: Diagnosis not present

## 2021-08-05 ENCOUNTER — Telehealth: Payer: Self-pay

## 2021-08-05 DIAGNOSIS — I2690 Septic pulmonary embolism without acute cor pulmonale: Secondary | ICD-10-CM | POA: Diagnosis not present

## 2021-08-05 DIAGNOSIS — M4646 Discitis, unspecified, lumbar region: Secondary | ICD-10-CM | POA: Diagnosis not present

## 2021-08-05 DIAGNOSIS — M48061 Spinal stenosis, lumbar region without neurogenic claudication: Secondary | ICD-10-CM | POA: Diagnosis not present

## 2021-08-05 DIAGNOSIS — M4626 Osteomyelitis of vertebra, lumbar region: Secondary | ICD-10-CM | POA: Diagnosis not present

## 2021-08-05 DIAGNOSIS — M4316 Spondylolisthesis, lumbar region: Secondary | ICD-10-CM | POA: Diagnosis not present

## 2021-08-05 DIAGNOSIS — B9561 Methicillin susceptible Staphylococcus aureus infection as the cause of diseases classified elsewhere: Secondary | ICD-10-CM | POA: Diagnosis not present

## 2021-08-05 NOTE — Telephone Encounter (Signed)
Patient wanted to know if she could increase Gabapentin & Robaxin for more pain relief. When taking the 2 medications her pain goes down to a level "2/10". Her goal is to have no pain in the left hip & lowe back.    Call back phone [phone].  Please advise Dr. Dagoberto Ligas is not available today.  Thank you

## 2021-08-05 NOTE — Telephone Encounter (Signed)
/  Transitional Care Call--who you spoke with Mrs. Samantha King. [place]   Are you/is patient experiencing any problems since coming home? None problems. Are there any questions regarding any aspect of care? None. Are there any questions regarding medications administration/dosing? Patient wanted to know if she could increase Gabapentin & Robaxin for more pain relief. When taking the 2 medication her pain goes down to a level "2/10". She would like to have no pain.  Are meds being taken as prescribed? Yes. Patient should review meds with caller to confirm. Yes.  Have there been any falls? No falls to report.  Has Home Health been to the house and/or have they contacted you? Yes. If not, have you tried to contact them? N/a but has [place] phone number. Can we help you contact them? No.  Are bowels and bladder emptying properly? No problem. Are there any unexpected incontinence issues? None.If applicable, is patient following bowel/bladder programs?No problem. Any fevers, problems with breathing, unexpected pain? None Are there any skin problems or new areas of breakdown?None Has the patient/family member arranged specialty MD follow up (ie cardiology/neurology/renal/surgical/etc)?  Yes. Can we help arrange? No. Does the patient need any other services or support that we can help arrange?None. Are caregivers following through as expected in assisting the patient?Yes.         11. Has the patient quit smoking, drinking alcohol, or using drugs as recommended? Per Patient no issues with such activities.   Appointment Date/Time/ Arrival time/ and who they are seeing [address]  Dr. Dagoberto Ligas on 08/28/2021.

## 2021-08-06 ENCOUNTER — Encounter: Payer: Self-pay | Admitting: Infectious Diseases

## 2021-08-06 ENCOUNTER — Ambulatory Visit (INDEPENDENT_AMBULATORY_CARE_PROVIDER_SITE_OTHER): Payer: Medicare Other | Admitting: Infectious Diseases

## 2021-08-06 ENCOUNTER — Other Ambulatory Visit: Payer: Self-pay

## 2021-08-06 VITALS — BP 143/82 | HR 78 | Temp 97.6°F | Ht 62.0 in | Wt 175.0 lb

## 2021-08-06 DIAGNOSIS — M4646 Discitis, unspecified, lumbar region: Secondary | ICD-10-CM | POA: Diagnosis not present

## 2021-08-06 DIAGNOSIS — I76 Septic arterial embolism: Secondary | ICD-10-CM | POA: Diagnosis not present

## 2021-08-06 DIAGNOSIS — B9561 Methicillin susceptible Staphylococcus aureus infection as the cause of diseases classified elsewhere: Secondary | ICD-10-CM | POA: Diagnosis not present

## 2021-08-06 DIAGNOSIS — R7881 Bacteremia: Secondary | ICD-10-CM | POA: Diagnosis not present

## 2021-08-06 NOTE — Progress Notes (Signed)
[facility]                                                             [place], [place], [place], [address]                                                                  Phn. [phone]; Fax: [phone]                                                                             Date: 08/06/21  Reason for HFU: Discitis and osteomyelitis    Assessment Problem List Items Addressed This Visit       Cardiovascular and Mediastinum   Septic embolism [facility])     Musculoskeletal and Integument   Discitis of lumbar region - Primary     Other   MSSA bacteremia    # Lumbar Discitis and osteomyelitis  # MSSA bacteremia with pulmonary septic emboli concerning for TV endocarditis although TTE negative - Has completed 6 weeks of IV cefazolin on 9/22 during inpatient stay ( 5 days ago prior to this visit). Back pain has significantly improved with normalization of inflammatory markers. MRI L spine 8/24 although mentioned 10-15 mm of fluid collection in the left psoas area ( no epidural abscess), given improvement in back pain, normal ESR/CRP and very  size of fluid collection, expect IV abtx would have resolved the collection and hence, would not get a repeat MRI.   Plan Monitor off antibiotics  Continue physical therapy as instructed Discussed with patient regarding symptoms or signs of recurring infection  Follow up as needed   All questions and concerns were discussed and addressed. Patient verbalized understanding of the plan. ____________________________________________________________________________________________________________________  HPI: 75 year old female with PMH of who is here for HFU in the setting of MSSA bacteremia , septic emboli and Lumbar discitis and Osteomyelitis. Initial MRI did not show findings concerning for vertebral infection on 8/12 but follow up MRI 8/24  showed findings suggestive of Lumbar discitis and osteomyelitis. She was seen by ID while in the hospital and had recommended 6 weeks of IV cefazolin until 9/22 which she has completed and was discharged on the same day. PICC line has been removed. TTE 06/21/21 with no evidence of vegetations or endocarditis.   She is here with her husband. Back pain is 0/10;  in the morning it is up to 4-5/10 and it improves once she gets and moves around. IV cefazolin was completed on 9/22. She is not on any PO abtx given improvement in her back pain and normalization of her inflammatory markers. Denies fevers, chills, sweats. Denies nausea, vomiting, abdominal pain and diarrhea. She is working with Physical Therapy. She feels she is getting more stronger  and getting  her energy back. Overall doing well and no complaints otherwise.   ROS: Constitutional: Negative for fever, chills, appetite change, fatigue and unexpected weight change.  Respiratory: Negative for cough, shortness of breath Cardiovascular: Negative for chest pain, palpitations and leg swelling.  Gastrointestinal: Negative for nausea, vomiting, abdominal pain, diarrhea/constipation, .  Genitourinary: Negative for dysuria, hematuria, flank pain Musculoskeletal: Negative for myalgias, arthralgia, joint swelling, arthralgias Skin: Negative for rashes, lesions  Neurological: Negative for weakness, dizziness or headache  Past Medical History:  Diagnosis Date   CAD (coronary artery disease)    a. s/p INF-LAT STEMI => s/p DES-RCA c/b VF arrest and cardiogenic shock   Cardiac arrest ([place]) 10/2012   STEMI with VF arrest x 2    GERD (gastroesophageal reflux disease)    H1N1 influenza 10/2012   Hx of echocardiogram    a. Echo 10/30/12: Moderate LVH, EF 55-60%, normal wall motion, PASP 45   Hyperlipidemia    Hypertension    Myocardial infarction ([place])    Pneumonia 10/2012   a. STEMI c/b LLL pneumonia in setting of recent H1N1 influenza   PUD (peptic  ulcer disease)    Syncope 1996   reported episode while driving in [place] in 1996 with full cardiac workup = negative   Tobacco abuse    Past Surgical History:  Procedure Laterality Date   BREAST EXCISIONAL BIOPSY     left breast ? 1981   CORONARY ANGIOPLASTY WITH STENT PLACEMENT     s/p inferior STEMI c/b VF arrest and cardiogenic shock requiring IABP   ESOPHAGOGASTRODUODENOSCOPY (EGD) WITH PROPOFOL N/A 03/10/2020   Procedure: ESOPHAGOGASTRODUODENOSCOPY (EGD) WITH PROPOFOL;  Surgeon: Lavena Bullion, DO;  Location: [place];  Service: Gastroenterology;  Laterality: N/A;   HEMOSTASIS CLIP PLACEMENT  03/10/2020   Procedure: HEMOSTASIS CLIP PLACEMENT;  Surgeon: Lavena Bullion, DO;  Location: [place] [facility];  Service: Gastroenterology;;   HEMOSTASIS CONTROL  03/10/2020   Procedure: HEMOSTASIS CONTROL;  Surgeon: Lavena Bullion, DO;  Location: [facility] [facility];  Service: Gastroenterology;;  epi   INTRA-AORTIC BALLOON PUMP INSERTION  10/29/2012   Procedure: INTRA-AORTIC BALLOON PUMP INSERTION;  Surgeon: Burnell Blanks, MD;  Location: [facility] CATH LAB;  Service: Cardiovascular;;   LEFT HEART CATHETERIZATION WITH CORONARY ANGIOGRAM N/A 10/29/2012   Procedure: LEFT HEART CATHETERIZATION WITH CORONARY ANGIOGRAM;  Surgeon: Burnell Blanks, MD;  Location: [facility] CATH LAB;  Service: Cardiovascular;  Laterality: N/A;   PERCUTANEOUS CORONARY STENT INTERVENTION (PCI-S)  10/29/2012   Procedure: PERCUTANEOUS CORONARY STENT INTERVENTION (PCI-S);  Surgeon: Burnell Blanks, MD;  Location: [facility] CATH LAB;  Service: Cardiovascular;;   TUBAL LIGATION     Current Outpatient Medications on File Prior to Visit  Medication Sig Dispense Refill   acetaminophen (TYLENOL) 325 MG tablet Take 650 mg by mouth every 6 (six) hours as needed for mild pain or headache. For headache or pain     ascorbic acid (VITAMIN C) 500 MG tablet Take 1 tablet (500 mg total) by mouth 2 (two) times daily. 60 tablet 0    atorvastatin (LIPITOR) 80 MG tablet Take 1 tablet (80 mg total) by mouth daily at 6 PM. 30 tablet 0   budesonide-formoterol (SYMBICORT) 160-4.5 MCG/ACT inhaler Inhale 2 puffs into the lungs 2 (two) times daily. 1 each 12   busPIRone (BUSPAR) 5 MG tablet Take 1 tablet (5 mg total) by mouth 3 (three) times daily. 90 tablet 0   clopidogrel (PLAVIX) 75 MG tablet Take 1 tablet (75 mg total) by  mouth daily. 30 tablet 0   colchicine 0.6 MG tablet Take 1 tablet (0.6 mg total) by mouth daily. 30 tablet 0   diclofenac Sodium (VOLTAREN) 1 % GEL Apply 4 g topically 4 (four) times daily. To both legs 400 g 0   DULoxetine (CYMBALTA) 60 MG capsule Take 1 capsule (60 mg total) by mouth daily. 30 capsule 0   fluticasone (FLONASE) 50 MCG/ACT nasal spray Place 1 spray into both nostrils daily. 16 g 0   furosemide (LASIX) 40 MG tablet Take 1 tablet (40 mg total) by mouth daily. 30 tablet 0   gabapentin (NEURONTIN) 300 MG capsule Take 1 capsule (300 mg total) by mouth 2 (two) times daily. 60 capsule 0   lidocaine (LIDODERM) 5 % Place 1 patch onto the skin daily. Apply to lower back at 7 am and remove at 7 pm daily. 30 patch 0   loratadine (CLARITIN) 10 MG tablet Take 1 tablet (10 mg total) by mouth daily.     methocarbamol (ROBAXIN) 750 MG tablet Take 1 tablet (750 mg total) by mouth every 6 (six) hours as needed for muscle spasms. 120 tablet 0   metoprolol tartrate (LOPRESSOR) 25 MG tablet Take 1 tablet (25 mg total) by mouth 2 (two) times daily. 60 tablet 0   oxyCODONE (OXY IR/ROXICODONE) 5 MG immediate release tablet Take 1 tablet (5 mg total) by mouth every 6 (six) hours as needed for severe pain. 15 tablet 0   pantoprazole (PROTONIX) 40 MG tablet Take 1 tablet (40 mg total) by mouth daily. 30 tablet 0   potassium chloride SA (KLOR-CON) 20 MEQ tablet Take 1 tablet (20 mEq total) by mouth 2 (two) times daily. 60 tablet 0   predniSONE (DELTASONE) 5 MG tablet Take 1 tablet (5 mg total) by mouth daily with breakfast. 30  tablet 0   Psyllium 58.12 % PACK Mix 1 packet in liquid and drink daily. 30 each 0   Zinc Sulfate 220 (50 Zn) MG TABS Take 1 tablet (220 mg total) by mouth daily. 30 tablet 0   Nystatin (GERHARDT'S BUTT CREAM) CREA Apply 1 application topically 2 (two) times daily. Use desitin or zinc sulfate     No current facility-administered medications on file prior to visit.   Allergies  Allergen Reactions   Shellfish Allergy Nausea And Vomiting   Codeine Other (See Comments)    Makes me hyperactive and not able to sleep   Lidocaine Palpitations   Social History   Socioeconomic History   Marital status: Married    Spouse name: Not on file   Number of children: Not on file   Years of education: Not on file   Highest education level: Not on file  Occupational History   Not on file  Tobacco Use   Smoking status: Former    Packs/day: 1.00    Years: 20.00    Pack years: 20.00    Types: Cigarettes    Quit date: 11/05/2012    Years since quitting: 8.7   Smokeless tobacco: Never  Vaping Use   Vaping Use: Never used  Substance and Sexual Activity   Alcohol use: No   Drug use: No   Sexual activity: Not on file  Other Topics Concern   Not on file  Social History Narrative   Not on file   Social Determinants of Health   Financial Resource Strain: Not on file  Food Insecurity: Not on file  Transportation Needs: Not on file  Physical Activity: Not on  file  Stress: Not on file  Social Connections: Not on file  Intimate Partner Violence: Not on file   Family History  Problem Relation Age of Onset   Lung cancer Father    Heart attack Brother    Stroke Maternal Grandmother    Colon cancer Neg Hx    Pancreatic cancer Neg Hx    Prostate cancer Neg Hx    Rectal cancer Neg Hx    Stomach cancer Neg Hx     Vitals BP (!) 143/82   Pulse 78   Temp 97.6 F (36.4 C) (Oral)   Ht 5' 2"  (1.575 m)   Wt 175 lb (79.4 kg)   BMI 32.01 kg/m    Examination  General - not in acute  distress, comfortably sitting in wheelchair  HEENT - PEERLA, no pallor and no icterus Chest - b/l clear air entry, no additional sounds CVS- Normal s1s2, RRR Abdomen - Soft, Non tender , non distended Ext- no pedal edema Neuro: grossly normal Back - No vertebral tenderness Psych : calm and cooperative   Recent labs CBC Latest Ref Rng & Units 07/29/2021 07/22/2021 07/18/2021  WBC 4.0 - 10.5 K/uL 13.3(H) 15.4(H) 15.7(H)  Hemoglobin 12.0 - 15.0 g/dL 11.8(L) 11.2(L) 10.5(L)  Hematocrit 36.0 - 46.0 % 37.3 35.5(L) 34.6(L)  Platelets 150 - 400 K/uL 342 414(H) 452(H)   CMP Latest Ref Rng & Units 07/29/2021 07/22/2021 07/16/2021  Glucose 70 - 99 mg/dL 111(H) 92 100(H)  BUN 8 - 23 mg/dL 26(H) 24(H) 25(H)  Creatinine 0.44 - 1.00 mg/dL 0.73 0.67 0.62  Sodium 135 - 145 mmol/L 133(L) 135 136  Potassium 3.5 - 5.1 mmol/L 4.6 4.2 4.1  Chloride 98 - 111 mmol/L 98 102 104  CO2 22 - 32 mmol/L 25 26 26   Calcium 8.9 - 10.3 mg/dL 9.8 9.5 9.2  Total Protein 6.5 - 8.1 g/dL 5.7(L) 5.6(L) -  Total Bilirubin 0.3 - 1.2 mg/dL 0.4 0.4 -  Alkaline Phos 38 - 126 U/L 137(H) 108 -  AST 15 - 41 U/L 50(H) 41 -  ALT 0 - 44 U/L 24 24 -    Pertinent Microbiology Results for orders placed or performed during the hospital encounter of 07/10/21  Urine Culture     Status: Abnormal   Collection Time: 07/15/21  3:03 PM   Specimen: Urine, Clean Catch  Result Value Ref Range Status   Specimen Description URINE, CLEAN CATCH  Final   Special Requests   Final    NONE Performed at [facility], [place] [address] [place], [place] [address]    Culture MULTIPLE SPECIES PRESENT, SUGGEST RECOLLECTION (A)  Final   Report Status 07/16/2021 FINAL  Final    Pertinent Imaging MRI Lumbar spine 07/03/21 IMPRESSION: 1. Interval development of bone marrow edema on both sides of the disc space at L3-4, suspicious for discitis and osteomyelitis. In addition, there is a 10-15 mm fluid collection within the left psoas which was not  present on the recent MRI and is suspicious for abscess. No epidural abscess. 2. Multilevel degenerative change with severe spinal stenosis at L4-5.  TTE 06/21/21 Left ventricular ejection fraction, by estimation, is 60 to 65%. The left ventricle has normal function. The left ventricle has no regional wall motion abnormalities. There is moderate asymmetric left ventricular hypertrophy of the basal-septal segment. Left ventricular diastolic parameters are consistent with Grade II diastolic dysfunction (pseudonormalization). 1. Right ventricular systolic function is normal. The right ventricular size is normal. There is mildly  elevated pulmonary artery systolic pressure. The estimated right ventricular systolic pressure is 15.8 mmHg. 2. 3. Left atrial size was mildly dilated. The mitral valve is normal in structure. Trivial mitral valve regurgitation. No evidence of mitral stenosis. Moderate mitral annular calcification. 4. 5. Tricuspid valve regurgitation is moderate. The aortic valve is calcified. Aortic valve regurgitation is trivial. Mild to moderate aortic valve sclerosis/calcification is present, without any evidence of aortic stenosis. Aortic valve mean gradient measures 11.0 mmHg. Aortic valve Vmax measures 2.18 m/s. 6. The inferior vena cava is normal in size with greater than 50% respiratory variability, suggesting right atrial pressure of 3 mmHg. 7. Comparison(s): No significant change from prior study. Prior images reviewed side by side.  All pertinent labs/Imagings/notes reviewed. All pertinent plain films and CT images have been personally visualized and interpreted; radiology reports have been reviewed. Decision making incorporated into the Impression / Recommendations.  I have spent a total of 60 minutes of face-to-face and non-face-to-face time, excluding clinical staff time, preparing to see patient, ordering tests and/or medications, and provide counseling the  patient    Electronically signed by:  [name], MD Infectious Disease Physician [facility] [address] [place], [place] [address] Phone: [phone]  Fax: [phone]

## 2021-08-07 DIAGNOSIS — B9561 Methicillin susceptible Staphylococcus aureus infection as the cause of diseases classified elsewhere: Secondary | ICD-10-CM | POA: Diagnosis not present

## 2021-08-07 DIAGNOSIS — M48061 Spinal stenosis, lumbar region without neurogenic claudication: Secondary | ICD-10-CM | POA: Diagnosis not present

## 2021-08-07 DIAGNOSIS — I76 Septic arterial embolism: Secondary | ICD-10-CM | POA: Insufficient documentation

## 2021-08-07 DIAGNOSIS — M4626 Osteomyelitis of vertebra, lumbar region: Secondary | ICD-10-CM | POA: Diagnosis not present

## 2021-08-07 DIAGNOSIS — I2690 Septic pulmonary embolism without acute cor pulmonale: Secondary | ICD-10-CM | POA: Diagnosis not present

## 2021-08-07 DIAGNOSIS — M4316 Spondylolisthesis, lumbar region: Secondary | ICD-10-CM | POA: Diagnosis not present

## 2021-08-07 DIAGNOSIS — M4646 Discitis, unspecified, lumbar region: Secondary | ICD-10-CM | POA: Insufficient documentation

## 2021-08-08 DIAGNOSIS — J45909 Unspecified asthma, uncomplicated: Secondary | ICD-10-CM | POA: Diagnosis not present

## 2021-08-08 DIAGNOSIS — I2510 Atherosclerotic heart disease of native coronary artery without angina pectoris: Secondary | ICD-10-CM | POA: Diagnosis not present

## 2021-08-08 DIAGNOSIS — M4646 Discitis, unspecified, lumbar region: Secondary | ICD-10-CM | POA: Diagnosis not present

## 2021-08-08 DIAGNOSIS — Z23 Encounter for immunization: Secondary | ICD-10-CM | POA: Diagnosis not present

## 2021-08-08 DIAGNOSIS — I700 Atherosclerosis of aorta: Secondary | ICD-10-CM | POA: Diagnosis not present

## 2021-08-08 DIAGNOSIS — A4101 Sepsis due to Methicillin susceptible Staphylococcus aureus: Secondary | ICD-10-CM | POA: Diagnosis not present

## 2021-08-08 DIAGNOSIS — I480 Paroxysmal atrial fibrillation: Secondary | ICD-10-CM | POA: Diagnosis not present

## 2021-08-08 DIAGNOSIS — I119 Hypertensive heart disease without heart failure: Secondary | ICD-10-CM | POA: Diagnosis not present

## 2021-08-08 DIAGNOSIS — J439 Emphysema, unspecified: Secondary | ICD-10-CM | POA: Diagnosis not present

## 2021-08-08 DIAGNOSIS — E7800 Pure hypercholesterolemia, unspecified: Secondary | ICD-10-CM | POA: Diagnosis not present

## 2021-08-09 ENCOUNTER — Telehealth: Payer: Self-pay | Admitting: Cardiovascular Disease

## 2021-08-09 NOTE — Telephone Encounter (Signed)
Noted.  Pt has her yearly follow up scheduled for 01/2022.  Was at [facility] from 07/10/21 - 08/01/21.   Discharge Diagnoses:   Principal Problem:   Debility Active Problems:   Hypokalemia   Severe muscle deconditioning   MSSA bacteremia   Loose stools   Leukocytosis   Hyperglycemia   Acute gout   Hyponatremia   Situational anxiety   Prerenal azotemia   Abnormal LFTs   Hypoalbuminemia

## 2021-08-09 NOTE — Telephone Encounter (Signed)
Wanted to let the dr know that she was in the hospital but she is feeling fine, and dont think she needs to see him for a follow up from her hospital f/u.

## 2021-08-12 DIAGNOSIS — M4316 Spondylolisthesis, lumbar region: Secondary | ICD-10-CM | POA: Diagnosis not present

## 2021-08-12 DIAGNOSIS — M4626 Osteomyelitis of vertebra, lumbar region: Secondary | ICD-10-CM | POA: Diagnosis not present

## 2021-08-12 DIAGNOSIS — M4646 Discitis, unspecified, lumbar region: Secondary | ICD-10-CM | POA: Diagnosis not present

## 2021-08-12 DIAGNOSIS — M48061 Spinal stenosis, lumbar region without neurogenic claudication: Secondary | ICD-10-CM | POA: Diagnosis not present

## 2021-08-12 DIAGNOSIS — I2690 Septic pulmonary embolism without acute cor pulmonale: Secondary | ICD-10-CM | POA: Diagnosis not present

## 2021-08-12 DIAGNOSIS — B9561 Methicillin susceptible Staphylococcus aureus infection as the cause of diseases classified elsewhere: Secondary | ICD-10-CM | POA: Diagnosis not present

## 2021-08-15 DIAGNOSIS — M4626 Osteomyelitis of vertebra, lumbar region: Secondary | ICD-10-CM | POA: Diagnosis not present

## 2021-08-15 DIAGNOSIS — I2690 Septic pulmonary embolism without acute cor pulmonale: Secondary | ICD-10-CM | POA: Diagnosis not present

## 2021-08-15 DIAGNOSIS — B9561 Methicillin susceptible Staphylococcus aureus infection as the cause of diseases classified elsewhere: Secondary | ICD-10-CM | POA: Diagnosis not present

## 2021-08-15 DIAGNOSIS — M48061 Spinal stenosis, lumbar region without neurogenic claudication: Secondary | ICD-10-CM | POA: Diagnosis not present

## 2021-08-15 DIAGNOSIS — M4316 Spondylolisthesis, lumbar region: Secondary | ICD-10-CM | POA: Diagnosis not present

## 2021-08-15 DIAGNOSIS — M4646 Discitis, unspecified, lumbar region: Secondary | ICD-10-CM | POA: Diagnosis not present

## 2021-08-16 DIAGNOSIS — M48061 Spinal stenosis, lumbar region without neurogenic claudication: Secondary | ICD-10-CM | POA: Diagnosis not present

## 2021-08-16 DIAGNOSIS — I2690 Septic pulmonary embolism without acute cor pulmonale: Secondary | ICD-10-CM | POA: Diagnosis not present

## 2021-08-16 DIAGNOSIS — M4316 Spondylolisthesis, lumbar region: Secondary | ICD-10-CM | POA: Diagnosis not present

## 2021-08-16 DIAGNOSIS — M4626 Osteomyelitis of vertebra, lumbar region: Secondary | ICD-10-CM | POA: Diagnosis not present

## 2021-08-16 DIAGNOSIS — M4646 Discitis, unspecified, lumbar region: Secondary | ICD-10-CM | POA: Diagnosis not present

## 2021-08-16 DIAGNOSIS — B9561 Methicillin susceptible Staphylococcus aureus infection as the cause of diseases classified elsewhere: Secondary | ICD-10-CM | POA: Diagnosis not present

## 2021-08-20 DIAGNOSIS — I2690 Septic pulmonary embolism without acute cor pulmonale: Secondary | ICD-10-CM | POA: Diagnosis not present

## 2021-08-20 DIAGNOSIS — M4646 Discitis, unspecified, lumbar region: Secondary | ICD-10-CM | POA: Diagnosis not present

## 2021-08-20 DIAGNOSIS — M48061 Spinal stenosis, lumbar region without neurogenic claudication: Secondary | ICD-10-CM | POA: Diagnosis not present

## 2021-08-20 DIAGNOSIS — M4316 Spondylolisthesis, lumbar region: Secondary | ICD-10-CM | POA: Diagnosis not present

## 2021-08-20 DIAGNOSIS — Z23 Encounter for immunization: Secondary | ICD-10-CM | POA: Diagnosis not present

## 2021-08-20 DIAGNOSIS — M4626 Osteomyelitis of vertebra, lumbar region: Secondary | ICD-10-CM | POA: Diagnosis not present

## 2021-08-20 DIAGNOSIS — B9561 Methicillin susceptible Staphylococcus aureus infection as the cause of diseases classified elsewhere: Secondary | ICD-10-CM | POA: Diagnosis not present

## 2021-08-21 DIAGNOSIS — B9561 Methicillin susceptible Staphylococcus aureus infection as the cause of diseases classified elsewhere: Secondary | ICD-10-CM | POA: Diagnosis not present

## 2021-08-21 DIAGNOSIS — M4646 Discitis, unspecified, lumbar region: Secondary | ICD-10-CM | POA: Diagnosis not present

## 2021-08-21 DIAGNOSIS — M48061 Spinal stenosis, lumbar region without neurogenic claudication: Secondary | ICD-10-CM | POA: Diagnosis not present

## 2021-08-21 DIAGNOSIS — M4316 Spondylolisthesis, lumbar region: Secondary | ICD-10-CM | POA: Diagnosis not present

## 2021-08-21 DIAGNOSIS — I2690 Septic pulmonary embolism without acute cor pulmonale: Secondary | ICD-10-CM | POA: Diagnosis not present

## 2021-08-21 DIAGNOSIS — M4626 Osteomyelitis of vertebra, lumbar region: Secondary | ICD-10-CM | POA: Diagnosis not present

## 2021-08-23 DIAGNOSIS — M4626 Osteomyelitis of vertebra, lumbar region: Secondary | ICD-10-CM | POA: Diagnosis not present

## 2021-08-23 DIAGNOSIS — M4316 Spondylolisthesis, lumbar region: Secondary | ICD-10-CM | POA: Diagnosis not present

## 2021-08-23 DIAGNOSIS — B9561 Methicillin susceptible Staphylococcus aureus infection as the cause of diseases classified elsewhere: Secondary | ICD-10-CM | POA: Diagnosis not present

## 2021-08-23 DIAGNOSIS — M48061 Spinal stenosis, lumbar region without neurogenic claudication: Secondary | ICD-10-CM | POA: Diagnosis not present

## 2021-08-23 DIAGNOSIS — M4646 Discitis, unspecified, lumbar region: Secondary | ICD-10-CM | POA: Diagnosis not present

## 2021-08-23 DIAGNOSIS — I2690 Septic pulmonary embolism without acute cor pulmonale: Secondary | ICD-10-CM | POA: Diagnosis not present

## 2021-08-28 ENCOUNTER — Encounter
Payer: Medicare Other | Attending: Physical Medicine and Rehabilitation | Admitting: Physical Medicine and Rehabilitation

## 2021-08-28 ENCOUNTER — Encounter: Payer: Self-pay | Admitting: Physical Medicine and Rehabilitation

## 2021-08-28 ENCOUNTER — Other Ambulatory Visit: Payer: Self-pay

## 2021-08-28 VITALS — BP 120/80 | HR 81 | Temp 98.2°F | Ht 62.0 in | Wt 178.0 lb

## 2021-08-28 DIAGNOSIS — R5381 Other malaise: Secondary | ICD-10-CM | POA: Diagnosis not present

## 2021-08-28 DIAGNOSIS — M4646 Discitis, unspecified, lumbar region: Secondary | ICD-10-CM | POA: Insufficient documentation

## 2021-08-28 DIAGNOSIS — M792 Neuralgia and neuritis, unspecified: Secondary | ICD-10-CM | POA: Insufficient documentation

## 2021-08-28 MED ORDER — DULOXETINE HCL 60 MG PO CPEP: 60.0000 mg | ORAL_CAPSULE | Freq: Every day | ORAL | 5 refills | Status: DC

## 2021-08-28 MED ORDER — GABAPENTIN 600 MG PO TABS: 600.0000 mg | ORAL_TABLET | Freq: Two times a day (BID) | ORAL | 5 refills | Status: AC

## 2021-08-28 MED ORDER — COLCHICINE 0.6 MG PO TABS: 0.6000 mg | ORAL_TABLET | Freq: Every day | ORAL | 5 refills | Status: AC | PRN

## 2021-08-28 NOTE — Progress Notes (Signed)
Subjective:    Patient ID: Samantha King, female    DOB: 1946-08-16, 75 y.o.   MRN: [id number]  HPI  Pt is a 75 yr old female with hx of L3/4 osteomyelitis/discitis endocarditis, septic shock/spetic emboli;  Has debility- here for hospital f/u.  Also had gout flare in hospital - was placed on Colchicine; also on cymbalta for nerve pain.  Off PO ABX now.   Switching from tylenol and ibuprofen-  Pain free right now.    Still problems with gout. Toes very painful sometimes.- doesn't last 5-10 minutes- burning-  Sounds more like nerve pain. Not gout.   Taking Gabapentin 300 mg 3x/day-  1 week ago - no change in burning Sx's so far.   Walking with rollator- sometimes just walks around using furniture in the home- but uses outside the home.   PT still coming - H/H; OT has finished.  Getting in shower, doing dishes, cooking some Husband does vacuum, sweeping;  Makes beds as well ans does laundry.     Moving to [place], [place]- moving in march or so.    Getting pedicure Friday.    Going to [place] over 2 days trip 500 miles- Scheduled in December.     Pain Inventory Average Pain 0  Pain Right Now 0 My pain is intermittent and dull  LOCATION OF PAIN  lower back & left buttock  BOWEL Number of stools per week: 7-9  Oral laxative use No  Type of laxative Fiber  BLADDER  Pads-urgency   Mobility walk without assistance walk with assistance use a cane use a walker how many minutes can you walk? 11 minutes ability to climb steps?  yes do you drive?  no transfers alone Do you have any goals in this area?  yes  Function retired Husband helps with household duties  Neuro/Psych No problems in this area  Prior Studies Any changes since last visit?  no  Physicians involved in your care Any changes since last visit?  no   Family History  Problem Relation Age of Onset   Lung cancer Father    Heart attack Brother    Stroke Maternal Grandmother     Colon cancer Neg Hx    Pancreatic cancer Neg Hx    Prostate cancer Neg Hx    Rectal cancer Neg Hx    Stomach cancer Neg Hx    Social History   Socioeconomic History   Marital status: Married    Spouse name: Not on file   Number of children: Not on file   Years of education: Not on file   Highest education level: Not on file  Occupational History   Not on file  Tobacco Use   Smoking status: Former    Packs/day: 1.00    Years: 20.00    Pack years: 20.00    Types: Cigarettes    Quit date: 11/05/2012    Years since quitting: 8.8   Smokeless tobacco: Never  Vaping Use   Vaping Use: Never used  Substance and Sexual Activity   Alcohol use: No   Drug use: No   Sexual activity: Not on file  Other Topics Concern   Not on file  Social History Narrative   Not on file   Social Determinants of Health   Financial Resource Strain: Not on file  Food Insecurity: Not on file  Transportation Needs: Not on file  Physical Activity: Not on file  Stress: Not on file  Social Connections: Not on  file   Past Surgical History:  Procedure Laterality Date   BREAST EXCISIONAL BIOPSY     left breast ? 1981   CORONARY ANGIOPLASTY WITH STENT PLACEMENT     s/p inferior STEMI c/b VF arrest and cardiogenic shock requiring IABP   ESOPHAGOGASTRODUODENOSCOPY (EGD) WITH PROPOFOL N/A 03/10/2020   Procedure: ESOPHAGOGASTRODUODENOSCOPY (EGD) WITH PROPOFOL;  Surgeon: Lavena Bullion, DO;  Location: [place];  Service: Gastroenterology;  Laterality: N/A;   HEMOSTASIS CLIP PLACEMENT  03/10/2020   Procedure: HEMOSTASIS CLIP PLACEMENT;  Surgeon: Lavena Bullion, DO;  Location: [place] [facility];  Service: Gastroenterology;;   HEMOSTASIS CONTROL  03/10/2020   Procedure: HEMOSTASIS CONTROL;  Surgeon: Lavena Bullion, DO;  Location: [place] [facility];  Service: Gastroenterology;;  epi   INTRA-AORTIC BALLOON PUMP INSERTION  10/29/2012   Procedure: INTRA-AORTIC BALLOON PUMP INSERTION;  Surgeon: Burnell Blanks, MD;  Location: [facility] CATH LAB;  Service: Cardiovascular;;   LEFT HEART CATHETERIZATION WITH CORONARY ANGIOGRAM N/A 10/29/2012   Procedure: LEFT HEART CATHETERIZATION WITH CORONARY ANGIOGRAM;  Surgeon: Burnell Blanks, MD;  Location: [facility] CATH LAB;  Service: Cardiovascular;  Laterality: N/A;   PERCUTANEOUS CORONARY STENT INTERVENTION (PCI-S)  10/29/2012   Procedure: PERCUTANEOUS CORONARY STENT INTERVENTION (PCI-S);  Surgeon: Burnell Blanks, MD;  Location: [facility] CATH LAB;  Service: Cardiovascular;;   TUBAL LIGATION     Past Medical History:  Diagnosis Date   CAD (coronary artery disease)    a. s/p INF-LAT STEMI => s/p DES-RCA c/b VF arrest and cardiogenic shock   Cardiac arrest ([place]) 10/2012   STEMI with VF arrest x 2    GERD (gastroesophageal reflux disease)    H1N1 influenza 10/2012   Hx of echocardiogram    a. Echo 10/30/12: Moderate LVH, EF 55-60%, normal wall motion, PASP 45   Hyperlipidemia    Hypertension    Myocardial infarction ([place])    Pneumonia 10/2012   a. STEMI c/b LLL pneumonia in setting of recent H1N1 influenza   PUD (peptic ulcer disease)    Syncope 1996   reported episode while driving in [place] in 1996 with full cardiac workup = negative   Tobacco abuse    BP 120/80   Pulse 81   Temp 98.2 F (36.8 C)   Ht 5\' 2"  (1.575 m)   Wt 178 lb (80.7 kg)   SpO2 95%   BMI 32.56 kg/m   Opioid Risk Score:   Fall Risk Score:  `1  Depression screen PHQ 2/9  Depression screen [facility] 2/9 08/28/2021 08/06/2021  Decreased Interest 0 0  Down, Depressed, Hopeless 0 0  PHQ - 2 Score 0 0  Altered sleeping 0 -  Tired, decreased energy 0 -  Change in appetite 0 -  Feeling bad or failure about yourself  0 -  Trouble concentrating 0 -  Moving slowly or fidgety/restless 0 -  Suicidal thoughts 0 -  PHQ-9 Score 0 -    Review of Systems  Musculoskeletal:  Positive for back pain and gait problem.  All other systems reviewed and are negative.     Objective:    Physical Exam   Awake, alert, appropriate, accompanied by husband, using rollator, NAD MS: 5-/5 in HF/KE/KF and 5/5 in DF?PF B/L   Gait- fantastic- with and without rollator- no pain with gait- smooth and fast and steady gait  Neuro: Intact to light touch in LE's B/L      Assessment & Plan:    Pt is a 75 yr old female with  hx of L3/4 osteomyelitis/discitis endocarditis, septic shock/spetic emboli;  Has debility- here for hospital f/u.  Also had gout flare in hospital - was placed on Colchicine; also on cymbalta for nerve pain.  Off PO ABX now.   Intimacy is good- can start!  2. Will renew Duloxetine/Cymbalta- 60 mg daily- sent in 5 refills.    3. Suggest tennis balls- for trip- to relax muscle spasms can get in back of thighs and buttocks . Use tennis ball to hold pressure 2-4 minutes to relax muscles- don't massage.    4. PT outpt- referral to [facility]- for eval and treat to work on endurance and Proximal mild weakness of LE's- in HF/KE/KF  5. Can drive! No issues- work up ot bigger and faster.   6. Will refill Colchicine- but take as needed for gout flare.    7. Increase gabapentin to 600 mg 2x/day x 1 week- IF NOT BETTER< can increase to 600 mg in Am and 1200 mg at night- for nerve pain- 5 refills.   8. F/u in 3 months- if doing great- can cancel-   I spent a total of 32 minutes on visit- as detailed above.

## 2021-08-28 NOTE — Patient Instructions (Signed)
Pt is a 75 yr old female with hx of L3/4 osteomyelitis/discitis endocarditis, septic shock/spetic emboli;  Has debility- here for hospital f/u.  Also had gout flare in hospital - was placed on Colchicine; also on cymbalta for nerve pain.  Off PO ABX now.   Intimacy is good- can start!  2. Will renew Duloxetine/Cymbalta- 60 mg daily- sent in 5 refills.    3. Suggest tennis balls- for trip- to relax muscle spasms can get in back of thighs and buttocks . Use tennis ball to hold pressure 2-4 minutes to relax muscles- don't massage.    4. PT outpt- referral to [facility]- for eval and treat to work on endurance and Proximal mild weakness of LE's- in HF/KE/KF  5. Can drive! No issues- work up ot bigger and faster.   6. Will refill Colchicine- but take as needed for gout flare.    7. Increase gabapentin to 600 mg 2x/day x 1 week- IF NOT BETTER< can increase to 600 mg in Am and 1200 mg at night- for nerve pain- 5 refills.   8. F/u in 3 months- if doing great- can cancel-

## 2021-08-29 DIAGNOSIS — M4646 Discitis, unspecified, lumbar region: Secondary | ICD-10-CM | POA: Diagnosis not present

## 2021-08-29 DIAGNOSIS — M4316 Spondylolisthesis, lumbar region: Secondary | ICD-10-CM | POA: Diagnosis not present

## 2021-08-29 DIAGNOSIS — M48061 Spinal stenosis, lumbar region without neurogenic claudication: Secondary | ICD-10-CM | POA: Diagnosis not present

## 2021-08-29 DIAGNOSIS — I2690 Septic pulmonary embolism without acute cor pulmonale: Secondary | ICD-10-CM | POA: Diagnosis not present

## 2021-08-29 DIAGNOSIS — M4626 Osteomyelitis of vertebra, lumbar region: Secondary | ICD-10-CM | POA: Diagnosis not present

## 2021-08-29 DIAGNOSIS — B9561 Methicillin susceptible Staphylococcus aureus infection as the cause of diseases classified elsewhere: Secondary | ICD-10-CM | POA: Diagnosis not present

## 2021-08-30 ENCOUNTER — Other Ambulatory Visit (HOSPITAL_BASED_OUTPATIENT_CLINIC_OR_DEPARTMENT_OTHER): Payer: Self-pay

## 2021-09-01 DIAGNOSIS — M109 Gout, unspecified: Secondary | ICD-10-CM | POA: Diagnosis not present

## 2021-09-01 DIAGNOSIS — Z7902 Long term (current) use of antithrombotics/antiplatelets: Secondary | ICD-10-CM | POA: Diagnosis not present

## 2021-09-01 DIAGNOSIS — R159 Full incontinence of feces: Secondary | ICD-10-CM | POA: Diagnosis not present

## 2021-09-01 DIAGNOSIS — E785 Hyperlipidemia, unspecified: Secondary | ICD-10-CM | POA: Diagnosis not present

## 2021-09-01 DIAGNOSIS — E279 Disorder of adrenal gland, unspecified: Secondary | ICD-10-CM | POA: Diagnosis not present

## 2021-09-01 DIAGNOSIS — K5790 Diverticulosis of intestine, part unspecified, without perforation or abscess without bleeding: Secondary | ICD-10-CM | POA: Diagnosis not present

## 2021-09-01 DIAGNOSIS — M4626 Osteomyelitis of vertebra, lumbar region: Secondary | ICD-10-CM | POA: Diagnosis not present

## 2021-09-01 DIAGNOSIS — N319 Neuromuscular dysfunction of bladder, unspecified: Secondary | ICD-10-CM | POA: Diagnosis not present

## 2021-09-01 DIAGNOSIS — N200 Calculus of kidney: Secondary | ICD-10-CM | POA: Diagnosis not present

## 2021-09-01 DIAGNOSIS — K279 Peptic ulcer, site unspecified, unspecified as acute or chronic, without hemorrhage or perforation: Secondary | ICD-10-CM | POA: Diagnosis not present

## 2021-09-01 DIAGNOSIS — B9561 Methicillin susceptible Staphylococcus aureus infection as the cause of diseases classified elsewhere: Secondary | ICD-10-CM | POA: Diagnosis not present

## 2021-09-01 DIAGNOSIS — I2510 Atherosclerotic heart disease of native coronary artery without angina pectoris: Secondary | ICD-10-CM | POA: Diagnosis not present

## 2021-09-01 DIAGNOSIS — J432 Centrilobular emphysema: Secondary | ICD-10-CM | POA: Diagnosis not present

## 2021-09-01 DIAGNOSIS — M4316 Spondylolisthesis, lumbar region: Secondary | ICD-10-CM | POA: Diagnosis not present

## 2021-09-01 DIAGNOSIS — Z7951 Long term (current) use of inhaled steroids: Secondary | ICD-10-CM | POA: Diagnosis not present

## 2021-09-01 DIAGNOSIS — M48061 Spinal stenosis, lumbar region without neurogenic claudication: Secondary | ICD-10-CM | POA: Diagnosis not present

## 2021-09-01 DIAGNOSIS — I2690 Septic pulmonary embolism without acute cor pulmonale: Secondary | ICD-10-CM | POA: Diagnosis not present

## 2021-09-01 DIAGNOSIS — Z951 Presence of aortocoronary bypass graft: Secondary | ICD-10-CM | POA: Diagnosis not present

## 2021-09-01 DIAGNOSIS — I700 Atherosclerosis of aorta: Secondary | ICD-10-CM | POA: Diagnosis not present

## 2021-09-01 DIAGNOSIS — I252 Old myocardial infarction: Secondary | ICD-10-CM | POA: Diagnosis not present

## 2021-09-01 DIAGNOSIS — Z8701 Personal history of pneumonia (recurrent): Secondary | ICD-10-CM | POA: Diagnosis not present

## 2021-09-01 DIAGNOSIS — M4646 Discitis, unspecified, lumbar region: Secondary | ICD-10-CM | POA: Diagnosis not present

## 2021-09-01 DIAGNOSIS — Z87891 Personal history of nicotine dependence: Secondary | ICD-10-CM | POA: Diagnosis not present

## 2021-09-01 DIAGNOSIS — I071 Rheumatic tricuspid insufficiency: Secondary | ICD-10-CM | POA: Diagnosis not present

## 2021-09-05 DIAGNOSIS — I2690 Septic pulmonary embolism without acute cor pulmonale: Secondary | ICD-10-CM | POA: Diagnosis not present

## 2021-09-05 DIAGNOSIS — M4626 Osteomyelitis of vertebra, lumbar region: Secondary | ICD-10-CM | POA: Diagnosis not present

## 2021-09-05 DIAGNOSIS — B9561 Methicillin susceptible Staphylococcus aureus infection as the cause of diseases classified elsewhere: Secondary | ICD-10-CM | POA: Diagnosis not present

## 2021-09-05 DIAGNOSIS — M48061 Spinal stenosis, lumbar region without neurogenic claudication: Secondary | ICD-10-CM | POA: Diagnosis not present

## 2021-09-05 DIAGNOSIS — M4316 Spondylolisthesis, lumbar region: Secondary | ICD-10-CM | POA: Diagnosis not present

## 2021-09-05 DIAGNOSIS — M4646 Discitis, unspecified, lumbar region: Secondary | ICD-10-CM | POA: Diagnosis not present

## 2021-09-10 DIAGNOSIS — M4626 Osteomyelitis of vertebra, lumbar region: Secondary | ICD-10-CM | POA: Diagnosis not present

## 2021-09-10 DIAGNOSIS — B9561 Methicillin susceptible Staphylococcus aureus infection as the cause of diseases classified elsewhere: Secondary | ICD-10-CM | POA: Diagnosis not present

## 2021-09-10 DIAGNOSIS — M4646 Discitis, unspecified, lumbar region: Secondary | ICD-10-CM | POA: Diagnosis not present

## 2021-09-10 DIAGNOSIS — M4316 Spondylolisthesis, lumbar region: Secondary | ICD-10-CM | POA: Diagnosis not present

## 2021-09-10 DIAGNOSIS — I2690 Septic pulmonary embolism without acute cor pulmonale: Secondary | ICD-10-CM | POA: Diagnosis not present

## 2021-09-10 DIAGNOSIS — M48061 Spinal stenosis, lumbar region without neurogenic claudication: Secondary | ICD-10-CM | POA: Diagnosis not present

## 2021-09-12 ENCOUNTER — Telehealth: Payer: Self-pay

## 2021-09-12 MED ORDER — BUSPIRONE HCL 5 MG PO TABS: 5.0000 mg | ORAL_TABLET | Freq: Three times a day (TID) | ORAL | 5 refills | Status: AC

## 2021-09-12 MED ORDER — METHOCARBAMOL 750 MG PO TABS: 750.0000 mg | ORAL_TABLET | Freq: Four times a day (QID) | ORAL | 5 refills | Status: AC | PRN

## 2021-09-12 NOTE — Addendum Note (Signed)
Addended by: Courtney Heys on: 09/12/2021 07:30 PM   Modules accepted: Orders

## 2021-09-12 NOTE — Telephone Encounter (Signed)
Patient is calling to request refills on Buspirone 5 mg and Robaxin 750 mg sent to [facility] in [facility]

## 2021-09-13 NOTE — Telephone Encounter (Signed)
Patient notified of refills of Buspirone and Robaxin sent to pharmacy

## 2021-09-18 ENCOUNTER — Other Ambulatory Visit: Payer: Self-pay

## 2021-09-18 ENCOUNTER — Encounter: Payer: Self-pay | Admitting: Physical Therapy

## 2021-09-18 ENCOUNTER — Ambulatory Visit: Payer: Medicare Other | Attending: Physical Medicine and Rehabilitation | Admitting: Physical Therapy

## 2021-09-18 DIAGNOSIS — R2689 Other abnormalities of gait and mobility: Secondary | ICD-10-CM | POA: Diagnosis not present

## 2021-09-18 DIAGNOSIS — M4646 Discitis, unspecified, lumbar region: Secondary | ICD-10-CM | POA: Insufficient documentation

## 2021-09-18 DIAGNOSIS — R293 Abnormal posture: Secondary | ICD-10-CM | POA: Diagnosis not present

## 2021-09-18 DIAGNOSIS — R2681 Unsteadiness on feet: Secondary | ICD-10-CM | POA: Insufficient documentation

## 2021-09-18 DIAGNOSIS — Z961 Presence of intraocular lens: Secondary | ICD-10-CM | POA: Diagnosis not present

## 2021-09-18 NOTE — Addendum Note (Signed)
Addended by: Ramonita Lab on: 09/18/2021 05:08 PM   Modules accepted: Orders

## 2021-09-18 NOTE — Therapy (Signed)
[place] MAIN [facility] SERVICES [address] [place], [place], [address] Phone: [phone]   Fax:  [phone]  Physical Therapy Evaluation  Patient Details  Name: Samantha King MRN: [id number] Date of Birth: [birth year] Referring Provider (PT): Courtney Heys, MD   Encounter Date: 09/18/2021   PT End of Session - 09/18/21 0941     Visit Number 1    Number of Visits 17    Date for PT Re-Evaluation 11/13/21    Authorization Time Period Eval 11/9    Progress Note Due on Visit 10    PT Start Time 0800    PT Stop Time 0852    PT Time Calculation (min) 52 min    Equipment Utilized During Treatment Gait belt    Activity Tolerance Patient tolerated treatment well;Patient limited by fatigue    Behavior During Therapy [facility] for tasks assessed/performed             Past Medical History:  Diagnosis Date   CAD (coronary artery disease)    a. s/p INF-LAT STEMI => s/p DES-RCA c/b VF arrest and cardiogenic shock   Cardiac arrest ([place]) 10/2012   STEMI with VF arrest x 2    GERD (gastroesophageal reflux disease)    H1N1 influenza 10/2012   Hx of echocardiogram    a. Echo 10/30/12: Moderate LVH, EF 55-60%, normal wall motion, PASP 45   Hyperlipidemia    Hypertension    Myocardial infarction ([place])    Pneumonia 10/2012   a. STEMI c/b LLL pneumonia in setting of recent H1N1 influenza   PUD (peptic ulcer disease)    Syncope 1996   reported episode while driving in [place] in 1996 with full cardiac workup = negative   Tobacco abuse     Past Surgical History:  Procedure Laterality Date   BREAST EXCISIONAL BIOPSY     left breast ? 1981   CORONARY ANGIOPLASTY WITH STENT PLACEMENT     s/p inferior STEMI c/b VF arrest and cardiogenic shock requiring IABP   ESOPHAGOGASTRODUODENOSCOPY (EGD) WITH PROPOFOL N/A 03/10/2020   Procedure: ESOPHAGOGASTRODUODENOSCOPY (EGD) WITH PROPOFOL;  Surgeon: Lavena Bullion, DO;  Location: [facility];  Service:  Gastroenterology;  Laterality: N/A;   HEMOSTASIS CLIP PLACEMENT  03/10/2020   Procedure: HEMOSTASIS CLIP PLACEMENT;  Surgeon: Lavena Bullion, DO;  Location: [facility];  Service: Gastroenterology;;   HEMOSTASIS CONTROL  03/10/2020   Procedure: HEMOSTASIS CONTROL;  Surgeon: Lavena Bullion, DO;  Location: [facility];  Service: Gastroenterology;;  epi   INTRA-AORTIC BALLOON PUMP INSERTION  10/29/2012   Procedure: INTRA-AORTIC BALLOON PUMP INSERTION;  Surgeon: Burnell Blanks, MD;  Location: [facility] CATH LAB;  Service: Cardiovascular;;   LEFT HEART CATHETERIZATION WITH CORONARY ANGIOGRAM N/A 10/29/2012   Procedure: LEFT HEART CATHETERIZATION WITH CORONARY ANGIOGRAM;  Surgeon: Burnell Blanks, MD;  Location: [facility] C[facility] LAB;  Service: Cardiovascular;  Laterality: N/A;   PERCUTANEOUS CORONARY STENT INTERVENTION (PCI-S)  10/29/2012   Procedure: PERCUTANEOUS CORONARY STENT INTERVENTION (PCI-S);  Surgeon: Burnell Blanks, MD;  Location: [facility] CATH LAB;  Service: Cardiovascular;;   TUBAL LIGATION      There were no vitals filed for this visit.    Subjective Assessment - 09/18/21 0919     Subjective Patient reports continued difficulty with ambulating, especially in the community or without an AD, since being discharged from long hospitalization stay (08/22-09/22). She has been cleared to drive, however finds it difficult to do independently because folding up and transporting the  Rollator requires assistance. Patient was independent with all ADLs and mobility prior to hospitalization and would like to get back to prior level of function prior to March 2023 because they are moving to [place] to be closer to family.    Patient is accompained by: Family member    Pertinent History On 06/18/2021 patient was admitted to ED for acute LBP and decompensated from septic shock while awaiting imaging; pt was transferred to ICU for Afib. Imaging and Labs resulted in Lumbar discitis and Osteomyelitis  Dx. Pt Stabilized after 1 week and transferred to [facility] to wean supplemental O2 and continue Atbx for MSSA bacteremia. Once discharged from [facility], patient received HHPT. Other PMH significant for CAD, GERD, left breast biopsy, angioplasty with stent placement, hyperlipidemia, Hyperglycemia, Gout, nerve pain, and NSVT.    Limitations Standing;Lifting;Walking    Patient Stated Goals "I'd like to not have to use the Rollator or a shower chair anymore".    Currently in Pain? No/denies               [facility] PT Assessment - 09/18/21 0757       Assessment   Referring Provider (PT) Courtney Heys, MD    Onset Date/Surgical Date 06/18/21    Hand Dominance Right    Next MD Visit 12/02/2021      Precautions   Precautions None      Restrictions   Weight Bearing Restrictions No      Balance Screen   Has the patient fallen in the past 6 months No    Has the patient had a decrease in activity level because of a fear of falling?  Yes    Is the patient reluctant to leave their home because of a fear of falling?  No      Home Social worker Private residence    Living Arrangements Spouse/significant other    Type of [place] to enter    Entrance Stairs-Number of Steps [place] One level    [place] - 4 wheels;Cane - single point;Shower seat;Grab bars - toilet    Additional Comments Prefers not to use SPC.      Prior Function   Level of Independence Independent    Vocation Retired    [employer], eating out with friends, etc, visiting family (6 grandkids)      Cognition   Overall Cognitive Status Within Functional Limits for tasks assessed              SUBJECTIVE Chief complaint: Difficulty ambulating without AD due to balance and SOB. Prior history of physical therapy: Pt received PT services during inpatient stay on Speciality floor and post discharge with HHPT.  HHPT services ended last week (09/10/21). Hobbies: Volunteering at [employer], going out with friends, visiting family. Goals: Pt stated goals include "no more rollator" & "no more shower chair"   OBJECTIVE  BP seated, left arm: 144/77 O2: 95%, 75bpm  Posture FHRS posture, increased thoracic kyphosis noted in sitting and standing   Gait Ambulating without AD; narrow BOS, increased trunk flexion, decreased arm swing bilaterally, decreased gait speed and step length, and mild deviation from straight path noted.   Strength in sitting R/L 4+/4+ Hip flexion  4/4 Hip abduction  4/4 Hip adduction 4/4 Knee extension 4-/4- Knee flexion 4/4 Ankle Plantarflexion  4/4 Ankle Dorsiflexion    FUNCTIONAL OUTCOME MEASURES  Results Comments  5TSTS 15.15 seconds No UE support. Pt rated as "unable to perform a 6th one".  6 Minute Walk Test 520 ft, 1 seated rest break at 340 ft;  SpO2 92% during seated rest break. Took break because of SOB  10 Meter Gait Speed Fastest: 9.57s = 1.55m/s w/Rollator  14.93s= 0.71m/s without AD  Below normative values for full community ambulation without AD  FOTO 46% Predicted 54%    ASSESSMENT Clinical Impression: Pt is a pleasant 75 year-old female referred for difficulty with gait, endurance, and cardiovascular deconditioning. PT examination reveals gait deviations, especially when not using an AD, and deficits in posture and functional endurance. Pt will benefit from skilled PT to address these deficits, increase safety with mobility, decrease need for AD, and improve return to prior level of function.    PLAN Next Visit: assess gait through FGA, introduce HEP, address postural and gait deviations.           Objective measurements completed on examination: See above findings.                PT Education - 09/18/21 0941     Education Details Plan of Care, role of PT in the outpatient setting.    Person(s) Educated Patient;Spouse     Methods Explanation    Comprehension Verbalized understanding              PT Short Term Goals - 09/18/21 0946       PT SHORT TERM GOAL #1   Title Pt will be independent with HEP in order to improve endurance and balance to decrease fall risk and improve function at home and with grandkids.    Baseline No formal HEP in place.    Time 4    Period Weeks    Status New    Target Date 10/16/21               PT Long Term Goals - 09/18/21 0947       PT LONG TERM GOAL #1   Title Pt will decrease 5TSTS by at least 3 seconds in order to demonstrate clinically significant improvement in LE strength.    Baseline 11/9: 15.15sec with no UE support    Time 8    Period Weeks    Status New    Target Date 11/13/21      PT LONG TERM GOAL #2   Title Pt will increase 6MWT to >1545 ft in order to demonstrate clinically significant improvement in cardiopulmonary endurance and safety with community ambulation compared to age matched peers.    Baseline 11/9: 520 ft with 1 seated rest break at 340 ft.    Time 8    Period Weeks    Status New    Target Date 11/13/21      PT LONG TERM GOAL #3   Title Pt will increase gait speed in 10MWT to >1.37m/s without AD to improve safe community ambulation and independence.    Baseline 11/9: 0.25m/s without AD.    Time 8    Period Weeks    Status New    Target Date 11/13/21      PT LONG TERM GOAL #4   Title Patient will report taking a standing shower with no LOB or fatigue and no use of shower chair.    Baseline 11/9: Patient unable to take shower without using shower chair.    Time 8    Period Weeks    Status New  Target Date 11/13/21      PT LONG TERM GOAL #5   Title Patient will increase FOTO score to equal to or greater than 54% to demonstrate statistically significant improvement in mobility and quality of life.    Baseline 11/9: 46%    Time 8    Period Weeks    Status New    Target Date 11/13/21                     Plan - 09/18/21 0942     Clinical Impression Statement Pt is a pleasant 75 year-old female referred for difficulty with gait, endurance, and cardiovascular deconditioning. PT examination reveals gait deviations, especially when not using an AD, and deficits in posture and functional endurance. Pt will benefit from skilled PT to address these deficits, increase safety with mobility, decrease need for AD, and improve return to prior level of function.    Personal Factors and Comorbidities Age;Comorbidity 3+;Fitness;Past/Current Experience    Examination-Activity Limitations Lift;Carry;Reach Overhead    Examination-Participation Restrictions Church;Volunteer;Yard Work;Community Activity;Driving    Stability/Clinical Decision Making Evolving/Moderate complexity    Clinical Decision Making Moderate    Rehab Potential Good    PT Frequency 2x / week    PT Duration 8 weeks    PT Treatment/Interventions ADLs/Self Care Home Management;Moist Heat;DME Instruction;Gait training;Stair training;Functional mobility training;Therapeutic activities;Therapeutic exercise;Balance training;Neuromuscular re-education;Patient/family education;Energy conservation;Manual techniques    PT Next Visit Plan assess balance/gait via FGA, introduce HEP, address postural and gait deviations.    PT Home Exercise Plan To initiate next session    Consulted and Agree with Plan of Care Patient;Family member/caregiver    Family Member Consulted Husband             Patient will benefit from skilled therapeutic intervention in order to improve the following deficits and impairments:  Abnormal gait, Decreased balance, Decreased endurance, Difficulty walking, Cardiopulmonary status limiting activity, Pain, Decreased activity tolerance, Decreased strength, Postural dysfunction  Visit Diagnosis: Other abnormalities of gait and mobility  Unsteadiness on feet  Abnormal posture     Problem List Patient Active Problem List    Diagnosis Date Noted   Nerve pain 08/28/2021   Lumbar discitis 08/07/2021   Septic embolism ([place]) 08/07/2021   Hyponatremia 07/31/2021   Situational anxiety 07/31/2021   Prerenal azotemia 07/31/2021   Abnormal LFTs 07/31/2021   Hypoalbuminemia 07/31/2021   Acute gout    Hyperglycemia    Leukocytosis    Loose stools    Debility 07/10/2021   Post-operative pain    Bacteremia    Acute gout involving toe    Incontinence of feces    Paroxysmal atrial fibrillation (HCC)    Hypoxemia    Back pain 06/19/2021   Septic shock ([place]) 06/19/2021   COPD with acute exacerbation ([place]) 06/19/2021   Acute hypoxemic respiratory failure ([place]) 06/19/2021   Pulmonary nodules 06/19/2021   MSSA bacteremia 06/19/2021   Upper GI bleed 03/10/2020   Nausea 03/10/2020   Duodenal ulcer    Dieulafoy lesion of duodenum    Gastritis and gastroduodenitis    Coronary atherosclerosis of native coronary artery 11/10/2012   Tobacco abuse 11/09/2012   NSVT (nonsustained ventricular tachycardia) 11/09/2012   Acute paranoia ([place]) 11/09/2012   Severe muscle deconditioning 11/09/2012   Hyperlipidemia 11/09/2012   Hypokalemia 11/05/2012   Acute blood loss anemia 11/01/2012   H1N1 influenza with pneumonia 10/31/2012   Hypertension 10/29/2012   GERD (gastroesophageal reflux disease) 10/29/2012   Acute respiratory failure ([place])  10/29/2012   Cardiogenic shock ([place]) 10/29/2012   Cardiac arrest ([place]) 10/29/2012   ST elevation myocardial infarction (STEMI) of inferior wall ([place]) 10/29/2012    Arsenio Katz, SPT  Patrina Levering PT, DPT  [place] MAIN [facility] SERVICES [address] [place], [place], [address] Phone: [phone]   Fax:  [phone]  Name: Samantha King MRN: [id number] Date of Birth: 10/08/1946

## 2021-09-23 ENCOUNTER — Ambulatory Visit: Payer: Medicare Other | Admitting: Physical Therapy

## 2021-09-23 ENCOUNTER — Encounter: Payer: Self-pay | Admitting: Physical Therapy

## 2021-09-23 ENCOUNTER — Other Ambulatory Visit: Payer: Self-pay

## 2021-09-23 DIAGNOSIS — R2681 Unsteadiness on feet: Secondary | ICD-10-CM

## 2021-09-23 DIAGNOSIS — H35341 Macular cyst, hole, or pseudohole, right eye: Secondary | ICD-10-CM | POA: Diagnosis not present

## 2021-09-23 DIAGNOSIS — R293 Abnormal posture: Secondary | ICD-10-CM

## 2021-09-23 DIAGNOSIS — R2689 Other abnormalities of gait and mobility: Secondary | ICD-10-CM

## 2021-09-23 DIAGNOSIS — H2512 Age-related nuclear cataract, left eye: Secondary | ICD-10-CM | POA: Diagnosis not present

## 2021-09-23 DIAGNOSIS — M4646 Discitis, unspecified, lumbar region: Secondary | ICD-10-CM | POA: Diagnosis not present

## 2021-09-23 NOTE — Therapy (Signed)
[place] MAIN [facility] SERVICES [address] [place], [place], [address] Phone: [phone]   Fax:  [phone]  Physical Therapy Treatment  Patient Details  Name: Samantha King MRN: [id number] Date of Birth: Dec 01, 1945 Referring Provider (PT): Courtney Heys, MD   Encounter Date: 09/23/2021   PT End of Session - 09/23/21 0940     Visit Number 2    Number of Visits 17    Date for PT Re-Evaluation 11/13/21    Authorization Time Period Eval 11/9    Progress Note Due on Visit 10    PT Start Time 0933    PT Stop Time 7673    PT Time Calculation (min) 42 min    Equipment Utilized During Treatment Gait belt    Activity Tolerance Patient tolerated treatment well;Patient limited by fatigue    Behavior During Therapy [facility] for tasks assessed/performed             Past Medical History:  Diagnosis Date   CAD (coronary artery disease)    a. s/p INF-LAT STEMI => s/p DES-RCA c/b VF arrest and cardiogenic shock   Cardiac arrest ([place]) 10/2012   STEMI with VF arrest x 2    GERD (gastroesophageal reflux disease)    H1N1 influenza 10/2012   Hx of echocardiogram    a. Echo 10/30/12: Moderate LVH, EF 55-60%, normal wall motion, PASP 45   Hyperlipidemia    Hypertension    Myocardial infarction ([place])    Pneumonia 10/2012   a. STEMI c/b LLL pneumonia in setting of recent H1N1 influenza   PUD (peptic ulcer disease)    Syncope 1996   reported episode while driving in [place] in 1996 with full cardiac workup = negative   Tobacco abuse     Past Surgical History:  Procedure Laterality Date   BREAST EXCISIONAL BIOPSY     left breast ? 1981   CORONARY ANGIOPLASTY WITH STENT PLACEMENT     s/p inferior STEMI c/b VF arrest and cardiogenic shock requiring IABP   ESOPHAGOGASTRODUODENOSCOPY (EGD) WITH PROPOFOL N/A 03/10/2020   Procedure: ESOPHAGOGASTRODUODENOSCOPY (EGD) WITH PROPOFOL;  Surgeon: Lavena Bullion, DO;  Location: [place];  Service:  Gastroenterology;  Laterality: N/A;   HEMOSTASIS CLIP PLACEMENT  03/10/2020   Procedure: HEMOSTASIS CLIP PLACEMENT;  Surgeon: Lavena Bullion, DO;  Location: [facility];  Service: Gastroenterology;;   HEMOSTASIS CONTROL  03/10/2020   Procedure: HEMOSTASIS CONTROL;  Surgeon: Lavena Bullion, DO;  Location: [facility];  Service: Gastroenterology;;  epi   INTRA-AORTIC BALLOON PUMP INSERTION  10/29/2012   Procedure: INTRA-AORTIC BALLOON PUMP INSERTION;  Surgeon: Burnell Blanks, MD;  Location: [facility] CATH LAB;  Service: Cardiovascular;;   LEFT HEART CATHETERIZATION WITH CORONARY ANGIOGRAM N/A 10/29/2012   Procedure: LEFT HEART CATHETERIZATION WITH CORONARY ANGIOGRAM;  Surgeon: Burnell Blanks, MD;  Location: [facility] CATH LAB;  Service: Cardiovascular;  Laterality: N/A;   PERCUTANEOUS CORONARY STENT INTERVENTION (PCI-S)  10/29/2012   Procedure: PERCUTANEOUS CORONARY STENT INTERVENTION (PCI-S);  Surgeon: Burnell Blanks, MD;  Location: [facility] CATH LAB;  Service: Cardiovascular;;   TUBAL LIGATION      There were no vitals filed for this visit.   Subjective Assessment - 09/23/21 0938     Subjective Patient reports her Rollator broke this morning with the brake wire coming loose. She is going to try and get it fixed today; Denies any new falls. Reports no back pain;    Patient is accompained by: Family member  Pertinent History On 06/18/2021 patient was admitted to ED for acute LBP and decompensated from septic shock while awaiting imaging; pt was transferred to ICU for Afib. Imaging and Labs resulted in Lumbar discitis and Osteomyelitis Dx. Pt Stabilized after 1 week and transferred to [facility] to wean supplemental O2 and continue Atbx for MSSA bacteremia. Once discharged from [facility], patient received HHPT. Other PMH significant for CAD, GERD, left breast biopsy, angioplasty with stent placement, hyperlipidemia, Hyperglycemia, Gout, nerve pain, and NSVT.    Limitations  Standing;Lifting;Walking    Patient Stated Goals "I'd like to not have to use the Rollator or a shower chair anymore".    Currently in Pain? No/denies    Multiple Pain Sites No              TREATMENT: Warm up on Nustep BUE/BLE level 2 x4 min with cues to keep steps per minute >60 for cardiovascular conditioning; SPO2 95%, HR 75  Instructed patient in functional gait assessment, see below:     [facility] PT Assessment - 09/23/21 0001       Functional Gait  Assessment   Gait assessed  Yes    Gait Level Surface Walks 20 ft in less than 7 sec but greater than 5.5 sec, uses assistive device, slower speed, mild gait deviations, or deviates 6-10 in outside of the 12 in walkway width.    Change in Gait Speed Makes only minor adjustments to walking speed, or accomplishes a change in speed with significant gait deviations, deviates 10-15 in outside the 12 in walkway width, or changes speed but loses balance but is able to recover and continue walking.    Gait with Horizontal Head Turns Performs head turns smoothly with slight change in gait velocity (eg, minor disruption to smooth gait path), deviates 6-10 in outside 12 in walkway width, or uses an assistive device.    Gait with Vertical Head Turns Performs task with slight change in gait velocity (eg, minor disruption to smooth gait path), deviates 6 - 10 in outside 12 in walkway width or uses assistive device    Gait and Pivot Turn Pivot turns safely within 3 sec and stops quickly with no loss of balance.    Step Over Obstacle Is able to step over one shoe box (4.5 in total height) without changing gait speed. No evidence of imbalance.    Gait with Narrow Base of Support Ambulates 4-7 steps.    Gait with Eyes Closed Walks 20 ft, uses assistive device, slower speed, mild gait deviations, deviates 6-10 in outside 12 in walkway width. Ambulates 20 ft in less than 9 sec but greater than 7 sec.    Ambulating Backwards Walks 20 ft, uses assistive device,  slower speed, mild gait deviations, deviates 6-10 in outside 12 in walkway width.    Steps Alternating feet, must use rail.    Total Score 19    FGA comment: high risk for falls              Initiated HEP: Standing with green tband: BUE shoulder low row x10 reps BUE shoulder extension x10 reps Required min-mod Vcs for proper positioning and to increase erect posture  Standing: BLE heel raise x10 reps Alternate march x10 reps with 1 rail assist with min VCs for increased erect posture and to slow down LE movement for better motor control  Sit<>Stand from chair x10 reps with cues for positioning;   Provided written instruction for better adherence;  Tolerated well; denies  any pain; Patient reports understanding of HEP                   PT Education - 09/23/21 0939     Education Details balance/HEP    Person(s) Educated Patient;Spouse    Methods Explanation;Verbal cues    Comprehension Verbalized understanding;Returned demonstration;Verbal cues required;Need further instruction              PT Short Term Goals - 09/18/21 0946       PT SHORT TERM GOAL #1   Title Pt will be independent with HEP in order to improve endurance and balance to decrease fall risk and improve function at home and with grandkids.    Baseline No formal HEP in place.    Time 4    Period Weeks    Status New    Target Date 10/16/21               PT Long Term Goals - 09/18/21 0947       PT LONG TERM GOAL #1   Title Pt will decrease 5TSTS by at least 3 seconds in order to demonstrate clinically significant improvement in LE strength.    Baseline 11/9: 15.15sec with no UE support    Time 8    Period Weeks    Status New    Target Date 11/13/21      PT LONG TERM GOAL #2   Title Pt will increase 6MWT to >1545 ft in order to demonstrate clinically significant improvement in cardiopulmonary endurance and safety with community ambulation compared to age matched peers.     Baseline 11/9: 520 ft with 1 seated rest break at 340 ft.    Time 8    Period Weeks    Status New    Target Date 11/13/21      PT LONG TERM GOAL #3   Title Pt will increase gait speed in 10MWT to >1.64m/s without AD to improve safe community ambulation and independence.    Baseline 11/9: 0.35m/s without AD.    Time 8    Period Weeks    Status New    Target Date 11/13/21      PT LONG TERM GOAL #4   Title Patient will report taking a standing shower with no LOB or fatigue and no use of shower chair.    Baseline 11/9: Patient unable to take shower without using shower chair.    Time 8    Period Weeks    Status New    Target Date 11/13/21      PT LONG TERM GOAL #5   Title Patient will increase FOTO score to equal to or greater than 54% to demonstrate statistically significant improvement in mobility and quality of life.    Baseline 11/9: 46%    Time 8    Period Weeks    Status New    Target Date 11/13/21                   Plan - 09/23/21 1327     Clinical Impression Statement Patient motivated and participated well within session. She was instructed in functional gait assessment and tested as a high risk for falls. Patient does exhibit mild veering especially with head turns. But her biggest limitation is slower gait speed. Patient also exhibits forward flexed posture. She was instructed in advanced HEP to address postural re-education as well as LE strength. Patient required min VCs for proper exercise technique. She was able  to complete exercise well without increase in pain. She would benefit from additional skilled PT intervention to improve strength, balance and gait safety;    Personal Factors and Comorbidities Age;Comorbidity 3+;Fitness;Past/Current Experience    Examination-Activity Limitations Lift;Carry;Reach Overhead    Examination-Participation Restrictions Church;Volunteer;Yard Work;Community Activity;Driving    Stability/Clinical Decision Making  Evolving/Moderate complexity    Rehab Potential Good    PT Frequency 2x / week    PT Duration 8 weeks    PT Treatment/Interventions ADLs/Self Care Home Management;Moist Heat;DME Instruction;Gait training;Stair training;Functional mobility training;Therapeutic activities;Therapeutic exercise;Balance training;Neuromuscular re-education;Patient/family education;Energy conservation;Manual techniques    PT Next Visit Plan assess balance/gait via FGA, introduce HEP, address postural and gait deviations.    PT Home Exercise Plan To initiate next session    Consulted and Agree with Plan of Care Patient;Family member/caregiver    Family Member Consulted Husband             Patient will benefit from skilled therapeutic intervention in order to improve the following deficits and impairments:  Abnormal gait, Decreased balance, Decreased endurance, Difficulty walking, Cardiopulmonary status limiting activity, Pain, Decreased activity tolerance, Decreased strength, Postural dysfunction  Visit Diagnosis: Other abnormalities of gait and mobility  Unsteadiness on feet  Abnormal posture     Problem List Patient Active Problem List   Diagnosis Date Noted   Nerve pain 08/28/2021   Lumbar discitis 08/07/2021   Septic embolism ([place]) 08/07/2021   Hyponatremia 07/31/2021   Situational anxiety 07/31/2021   Prerenal azotemia 07/31/2021   Abnormal LFTs 07/31/2021   Hypoalbuminemia 07/31/2021   Acute gout    Hyperglycemia    Leukocytosis    Loose stools    Debility 07/10/2021   Post-operative pain    Bacteremia    Acute gout involving toe    Incontinence of feces    Paroxysmal atrial fibrillation (H[facility])    Hypoxemia    Back pain 06/19/2021   Septic shock ([place]) 06/19/2021   COPD with acute exacerbation ([place]) 06/19/2021   Acute hypoxemic respiratory failure ([place]) 06/19/2021   Pulmonary nodules 06/19/2021   MSSA bacteremia 06/19/2021   Upper GI bleed 03/10/2020   Nausea 03/10/2020    Duodenal ulcer    Dieulafoy lesion of duodenum    Gastritis and gastroduodenitis    Coronary atherosclerosis of native coronary artery 11/10/2012   Tobacco abuse 11/09/2012   NSVT (nonsustained ventricular tachycardia) 11/09/2012   Acute paranoia ([place]) 11/09/2012   Severe muscle deconditioning 11/09/2012   Hyperlipidemia 11/09/2012   Hypokalemia 11/05/2012   Acute blood loss anemia 11/01/2012   H1N1 influenza with pneumonia 10/31/2012   Hypertension 10/29/2012   GERD (gastroesophageal reflux disease) 10/29/2012   Acute respiratory failure ([place]) 10/29/2012   Cardiogenic shock ([place]) 10/29/2012   Cardiac arrest ([place]) 10/29/2012   ST elevation myocardial infarction (STEMI) of inferior wall ([place]) 10/29/2012    Nasser Ku, PT, DPT 09/23/2021, 1:29 PM  [place] MAIN [facility] SERVICES [address] [place], [place], [address] Phone: [phone]   Fax:  [phone]  Name: SU DUMA MRN: [id number] Date of Birth: 06-11-1946

## 2021-09-23 NOTE — Patient Instructions (Signed)
Access Code: [email or web address] URL: [email or web address] Date: 09/23/2021 Prepared by: Blanche East  Exercises Standing Bilateral Low Shoulder Row with Anchored Resistance - 1 x daily - 7 x weekly - 3 sets - 10 reps Shoulder Extension with Resistance - 1 x daily - 7 x weekly - 3 sets - 10 reps Sit to Stand with Armchair - 1 x daily - 7 x weekly - 3 sets - 10 reps Standing Heel Raise with Chair Support - 1 x daily - 7 x weekly - 3 sets - 10 reps Standing March with Unilateral Counter Support - 1 x daily - 7 x weekly - 1 sets - 15 reps

## 2021-09-25 ENCOUNTER — Other Ambulatory Visit: Payer: Self-pay

## 2021-09-25 ENCOUNTER — Ambulatory Visit: Payer: Medicare Other | Admitting: Physical Therapy

## 2021-09-25 DIAGNOSIS — R293 Abnormal posture: Secondary | ICD-10-CM | POA: Diagnosis not present

## 2021-09-25 DIAGNOSIS — M4646 Discitis, unspecified, lumbar region: Secondary | ICD-10-CM | POA: Diagnosis not present

## 2021-09-25 DIAGNOSIS — R2689 Other abnormalities of gait and mobility: Secondary | ICD-10-CM

## 2021-09-25 DIAGNOSIS — R2681 Unsteadiness on feet: Secondary | ICD-10-CM | POA: Diagnosis not present
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
## 2021-09-30 ENCOUNTER — Ambulatory Visit: Payer: Medicare Other | Admitting: Physical Therapy

## 2021-10-01 ENCOUNTER — Ambulatory Visit: Payer: Medicare Other

## 2021-10-01 ENCOUNTER — Other Ambulatory Visit: Payer: Self-pay

## 2021-10-01 DIAGNOSIS — R2689 Other abnormalities of gait and mobility: Secondary | ICD-10-CM | POA: Diagnosis not present

## 2021-10-01 DIAGNOSIS — M4646 Discitis, unspecified, lumbar region: Secondary | ICD-10-CM | POA: Diagnosis not present

## 2021-10-01 DIAGNOSIS — R293 Abnormal posture: Secondary | ICD-10-CM | POA: Diagnosis not present

## 2021-10-01 DIAGNOSIS — R2681 Unsteadiness on feet: Secondary | ICD-10-CM | POA: Diagnosis not present
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
## 2021-10-02 ENCOUNTER — Ambulatory Visit: Payer: Medicare Other | Admitting: Physical Therapy

## 2021-10-07 ENCOUNTER — Ambulatory Visit: Payer: Medicare Other | Admitting: Physical Therapy

## 2021-10-08 ENCOUNTER — Ambulatory Visit: Payer: Medicare Other

## 2021-10-09 ENCOUNTER — Ambulatory Visit: Payer: Medicare Other

## 2021-10-10 ENCOUNTER — Ambulatory Visit: Payer: Medicare Other | Attending: Physical Medicine and Rehabilitation

## 2021-10-14 ENCOUNTER — Ambulatory Visit: Payer: Medicare Other | Admitting: Physical Therapy

## 2021-10-15 ENCOUNTER — Ambulatory Visit: Payer: Medicare Other

## 2021-10-16 ENCOUNTER — Ambulatory Visit: Payer: Medicare Other | Admitting: Physical Therapy

## 2021-10-17 ENCOUNTER — Ambulatory Visit: Payer: Medicare Other | Admitting: Physical Therapy

## 2021-10-20 ENCOUNTER — Other Ambulatory Visit: Payer: Self-pay

## 2021-10-20 ENCOUNTER — Emergency Department: Payer: Medicare Other

## 2021-10-20 ENCOUNTER — Inpatient Hospital Stay
Admission: EM | Admit: 2021-10-20 | Discharge: 2021-10-22 | DRG: 193 | Disposition: A | Discharge status: Home / Self Care | Payer: Medicare Other | Attending: Obstetrics and Gynecology | Admitting: Obstetrics and Gynecology

## 2021-10-20 ENCOUNTER — Encounter: Payer: Self-pay | Admitting: Emergency Medicine

## 2021-10-20 DIAGNOSIS — J9601 Acute respiratory failure with hypoxia: Secondary | ICD-10-CM | POA: Diagnosis present

## 2021-10-20 DIAGNOSIS — Z91013 Allergy to seafood: Secondary | ICD-10-CM

## 2021-10-20 DIAGNOSIS — Z8249 Family history of ischemic heart disease and other diseases of the circulatory system: Secondary | ICD-10-CM

## 2021-10-20 DIAGNOSIS — J440 Chronic obstructive pulmonary disease with acute lower respiratory infection: Secondary | ICD-10-CM | POA: Diagnosis present

## 2021-10-20 DIAGNOSIS — R059 Cough, unspecified: Secondary | ICD-10-CM | POA: Diagnosis not present

## 2021-10-20 DIAGNOSIS — Z7902 Long term (current) use of antithrombotics/antiplatelets: Secondary | ICD-10-CM

## 2021-10-20 DIAGNOSIS — E785 Hyperlipidemia, unspecified: Secondary | ICD-10-CM | POA: Diagnosis present

## 2021-10-20 DIAGNOSIS — J441 Chronic obstructive pulmonary disease with (acute) exacerbation: Secondary | ICD-10-CM | POA: Diagnosis present

## 2021-10-20 DIAGNOSIS — E876 Hypokalemia: Secondary | ICD-10-CM | POA: Diagnosis present

## 2021-10-20 DIAGNOSIS — K219 Gastro-esophageal reflux disease without esophagitis: Secondary | ICD-10-CM | POA: Diagnosis present

## 2021-10-20 DIAGNOSIS — Z20822 Contact with and (suspected) exposure to covid-19: Secondary | ICD-10-CM | POA: Diagnosis present

## 2021-10-20 DIAGNOSIS — I480 Paroxysmal atrial fibrillation: Secondary | ICD-10-CM | POA: Diagnosis present

## 2021-10-20 DIAGNOSIS — Z955 Presence of coronary angioplasty implant and graft: Secondary | ICD-10-CM | POA: Diagnosis not present

## 2021-10-20 DIAGNOSIS — I5032 Chronic diastolic (congestive) heart failure: Secondary | ICD-10-CM | POA: Diagnosis present

## 2021-10-20 DIAGNOSIS — R0602 Shortness of breath: Secondary | ICD-10-CM | POA: Diagnosis not present

## 2021-10-20 DIAGNOSIS — K2970 Gastritis, unspecified, without bleeding: Secondary | ICD-10-CM | POA: Diagnosis present

## 2021-10-20 DIAGNOSIS — J90 Pleural effusion, not elsewhere classified: Secondary | ICD-10-CM | POA: Diagnosis not present

## 2021-10-20 DIAGNOSIS — I252 Old myocardial infarction: Secondary | ICD-10-CM | POA: Diagnosis not present

## 2021-10-20 DIAGNOSIS — Z888 Allergy status to other drugs, medicaments and biological substances status: Secondary | ICD-10-CM | POA: Diagnosis not present

## 2021-10-20 DIAGNOSIS — I10 Essential (primary) hypertension: Secondary | ICD-10-CM | POA: Diagnosis not present

## 2021-10-20 DIAGNOSIS — J189 Pneumonia, unspecified organism: Secondary | ICD-10-CM | POA: Diagnosis not present

## 2021-10-20 DIAGNOSIS — M549 Dorsalgia, unspecified: Secondary | ICD-10-CM | POA: Diagnosis present

## 2021-10-20 DIAGNOSIS — R509 Fever, unspecified: Secondary | ICD-10-CM | POA: Diagnosis not present

## 2021-10-20 DIAGNOSIS — I110 Hypertensive heart disease with heart failure: Secondary | ICD-10-CM | POA: Diagnosis present

## 2021-10-20 DIAGNOSIS — Z8674 Personal history of sudden cardiac arrest: Secondary | ICD-10-CM | POA: Diagnosis not present

## 2021-10-20 DIAGNOSIS — K2990 Gastroduodenitis, unspecified, without bleeding: Secondary | ICD-10-CM | POA: Diagnosis present

## 2021-10-20 DIAGNOSIS — R0902 Hypoxemia: Secondary | ICD-10-CM | POA: Diagnosis not present

## 2021-10-20 DIAGNOSIS — I2510 Atherosclerotic heart disease of native coronary artery without angina pectoris: Secondary | ICD-10-CM | POA: Diagnosis present

## 2021-10-20 DIAGNOSIS — Z885 Allergy status to narcotic agent status: Secondary | ICD-10-CM | POA: Diagnosis not present

## 2021-10-20 DIAGNOSIS — Z87891 Personal history of nicotine dependence: Secondary | ICD-10-CM

## 2021-10-20 DIAGNOSIS — Z7951 Long term (current) use of inhaled steroids: Secondary | ICD-10-CM

## 2021-10-20 DIAGNOSIS — R197 Diarrhea, unspecified: Secondary | ICD-10-CM | POA: Diagnosis not present

## 2021-10-20 DIAGNOSIS — J9600 Acute respiratory failure, unspecified whether with hypoxia or hypercapnia: Secondary | ICD-10-CM | POA: Diagnosis present

## 2021-10-20 DIAGNOSIS — Y95 Nosocomial condition: Secondary | ICD-10-CM | POA: Diagnosis present

## 2021-10-20 DIAGNOSIS — J439 Emphysema, unspecified: Secondary | ICD-10-CM | POA: Diagnosis not present

## 2021-10-20 DIAGNOSIS — I350 Nonrheumatic aortic (valve) stenosis: Secondary | ICD-10-CM | POA: Diagnosis present

## 2021-10-20 DIAGNOSIS — R070 Pain in throat: Secondary | ICD-10-CM | POA: Diagnosis not present

## 2021-10-20 DIAGNOSIS — I700 Atherosclerosis of aorta: Secondary | ICD-10-CM | POA: Diagnosis not present

## 2021-10-20 DIAGNOSIS — Z79899 Other long term (current) drug therapy: Secondary | ICD-10-CM

## 2021-10-20 DIAGNOSIS — I517 Cardiomegaly: Secondary | ICD-10-CM | POA: Diagnosis not present

## 2021-10-20 LAB — RESP PANEL BY RT-PCR (FLU A&B, COVID) ARPGX2
Influenza A by PCR: NEGATIVE
Influenza B by PCR: NEGATIVE
SARS Coronavirus 2 by RT PCR: NEGATIVE

## 2021-10-20 LAB — BASIC METABOLIC PANEL
Anion gap: 11 (ref 5–15)
BUN: 16 mg/dL (ref 8–23)
CO2: 22 mmol/L (ref 22–32)
Calcium: 9.4 mg/dL (ref 8.9–10.3)
Chloride: 101 mmol/L (ref 98–111)
Creatinine, Ser: 0.78 mg/dL (ref 0.44–1.00)
GFR, Estimated: >60 mL/min (ref >60)
Glucose, Bld: 145 mg/dL — ABNORMAL HIGH (ref 70–99)
Potassium: 3 mmol/L — ABNORMAL LOW (ref 3.5–5.1)
Sodium: 134 mmol/L — ABNORMAL LOW (ref 135–145)

## 2021-10-20 LAB — TROPONIN I (HIGH SENSITIVITY)
Troponin I (High Sensitivity): 278 ng/L (ref <18)
Troponin I (High Sensitivity): 237 ng/L (ref <18)
Troponin I (High Sensitivity): 225 ng/L (ref <18)

## 2021-10-20 LAB — CBC
HCT: 37.5 % (ref 36.0–46.0)
HCT: 35 % — ABNORMAL LOW (ref 36.0–46.0)
Hemoglobin: 12.2 g/dL (ref 12.0–15.0)
Hemoglobin: 11.3 g/dL — ABNORMAL LOW (ref 12.0–15.0)
MCH: 29 pg (ref 26.0–34.0)
MCH: 29 pg (ref 26.0–34.0)
MCHC: 32.5 g/dL (ref 30.0–36.0)
MCHC: 32.3 g/dL (ref 30.0–36.0)
MCV: 89.3 fL (ref 80.0–100.0)
MCV: 89.7 fL (ref 80.0–100.0)
Platelets: 435 10*3/uL — ABNORMAL HIGH (ref 150–400)
Platelets: 395 10*3/uL (ref 150–400)
RBC: 4.2 MIL/uL (ref 3.87–5.11)
RBC: 3.9 MIL/uL (ref 3.87–5.11)
RDW: 15.7 % — ABNORMAL HIGH (ref 11.5–15.5)
RDW: 15.8 % — ABNORMAL HIGH (ref 11.5–15.5)
WBC: 24.7 10*3/uL — ABNORMAL HIGH (ref 4.0–10.5)
WBC: 22.7 10*3/uL — ABNORMAL HIGH (ref 4.0–10.5)
nRBC: 0 % (ref 0.0–0.2)
nRBC: 0 % (ref 0.0–0.2)

## 2021-10-20 LAB — LACTIC ACID, PLASMA
Lactic Acid, Venous: 1.3 mmol/L (ref 0.5–1.9)
Lactic Acid, Venous: 1.1 mmol/L (ref 0.5–1.9)

## 2021-10-20 LAB — BRAIN NATRIURETIC PEPTIDE: B Natriuretic Peptide: 1239.2 pg/mL — ABNORMAL HIGH (ref 0.0–100.0)

## 2021-10-20 LAB — CREATININE, SERUM
Creatinine, Ser: 0.69 mg/dL (ref 0.44–1.00)
GFR, Estimated: >60 mL/min

## 2021-10-20 LAB — PROCALCITONIN: Procalcitonin: 0.33 ng/mL

## 2021-10-20 IMAGING — CR DG CHEST 2V
1 series · 2 of 2 positions shown · non-contrast
Comparison: [DATE].

CLINICAL DATA: Shortness of breath.

EXAM:
CHEST - 2 VIEW

[Series 1: dg chest 2 view · 0.14mm/px · 2 of 2 slices shown]
[im 1/2]
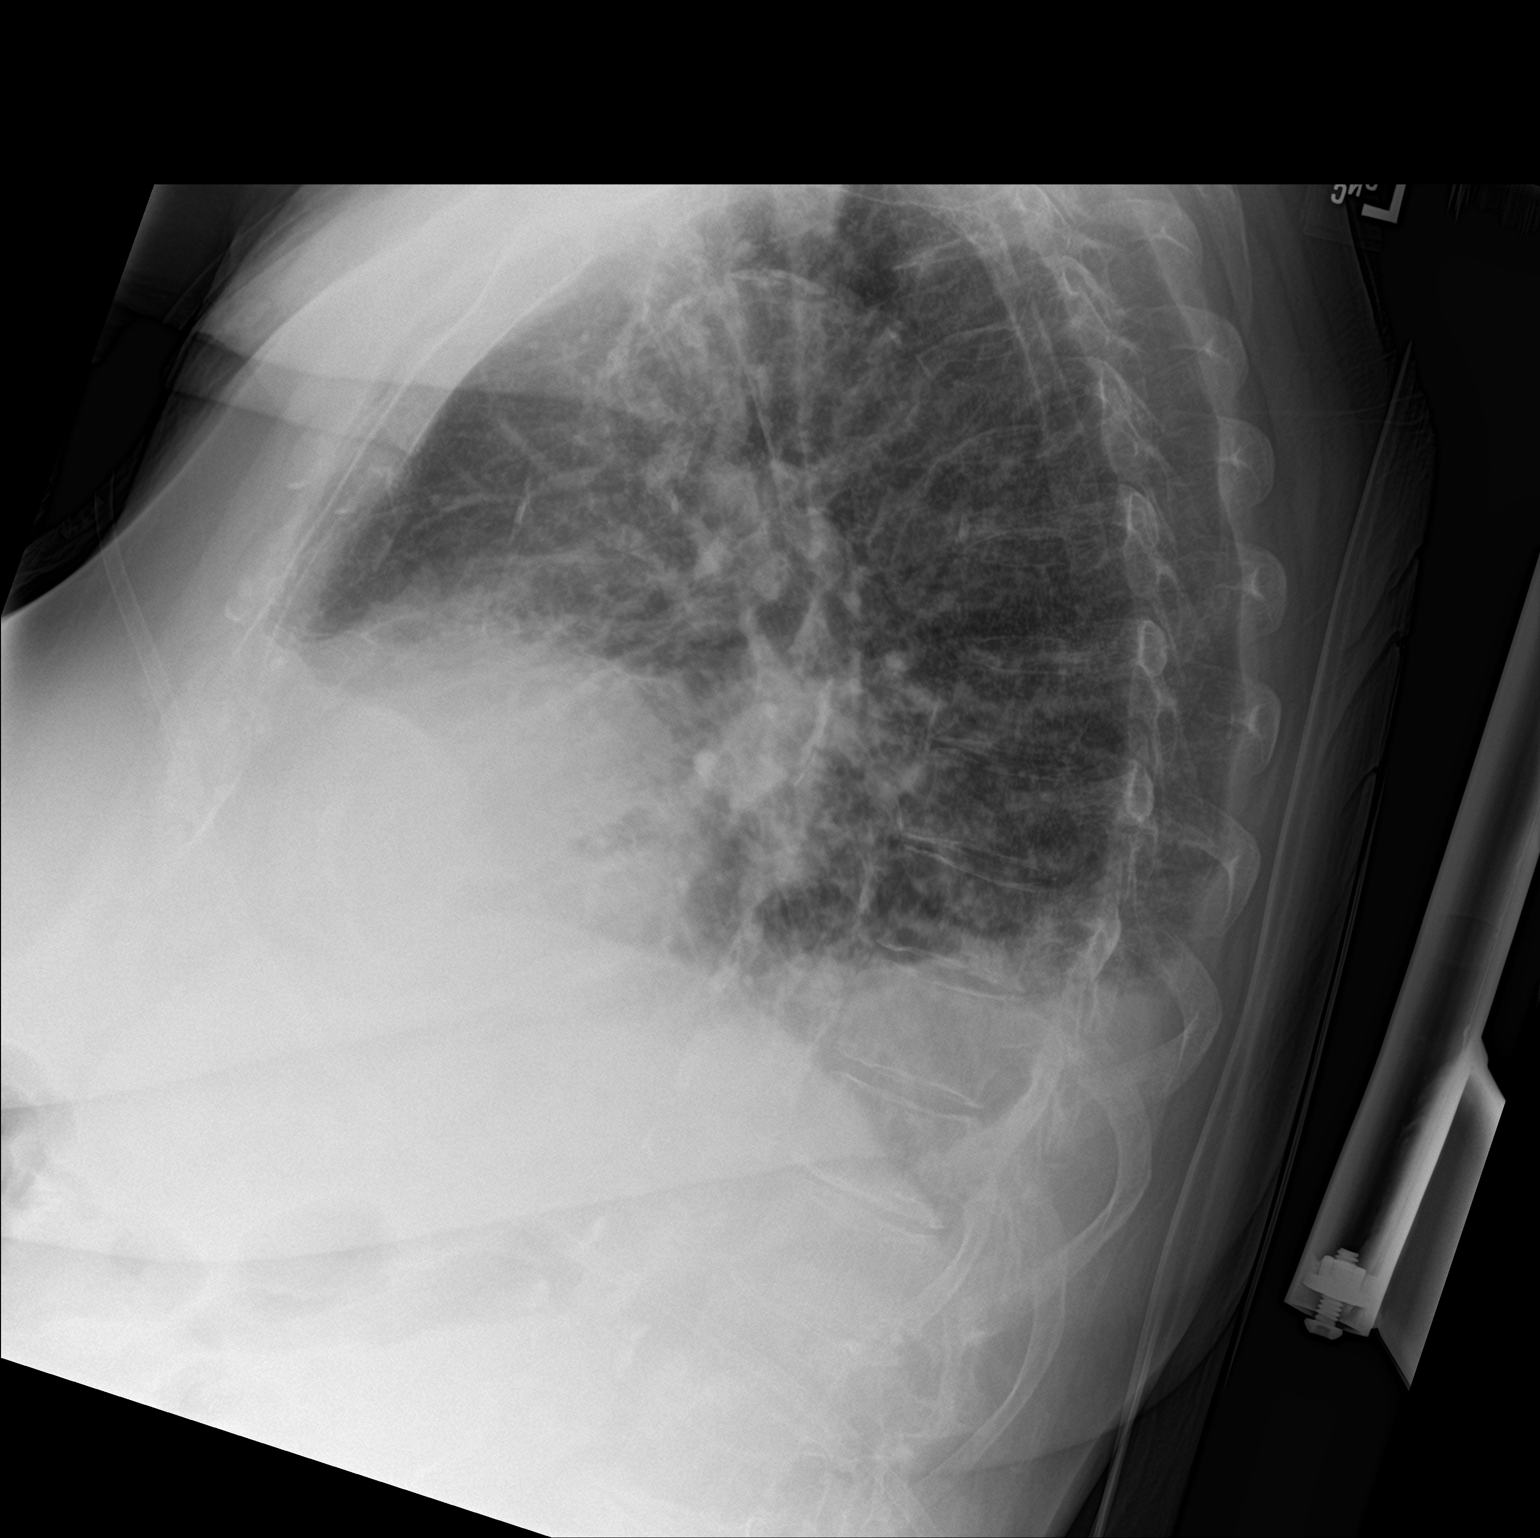
[im 2/2]
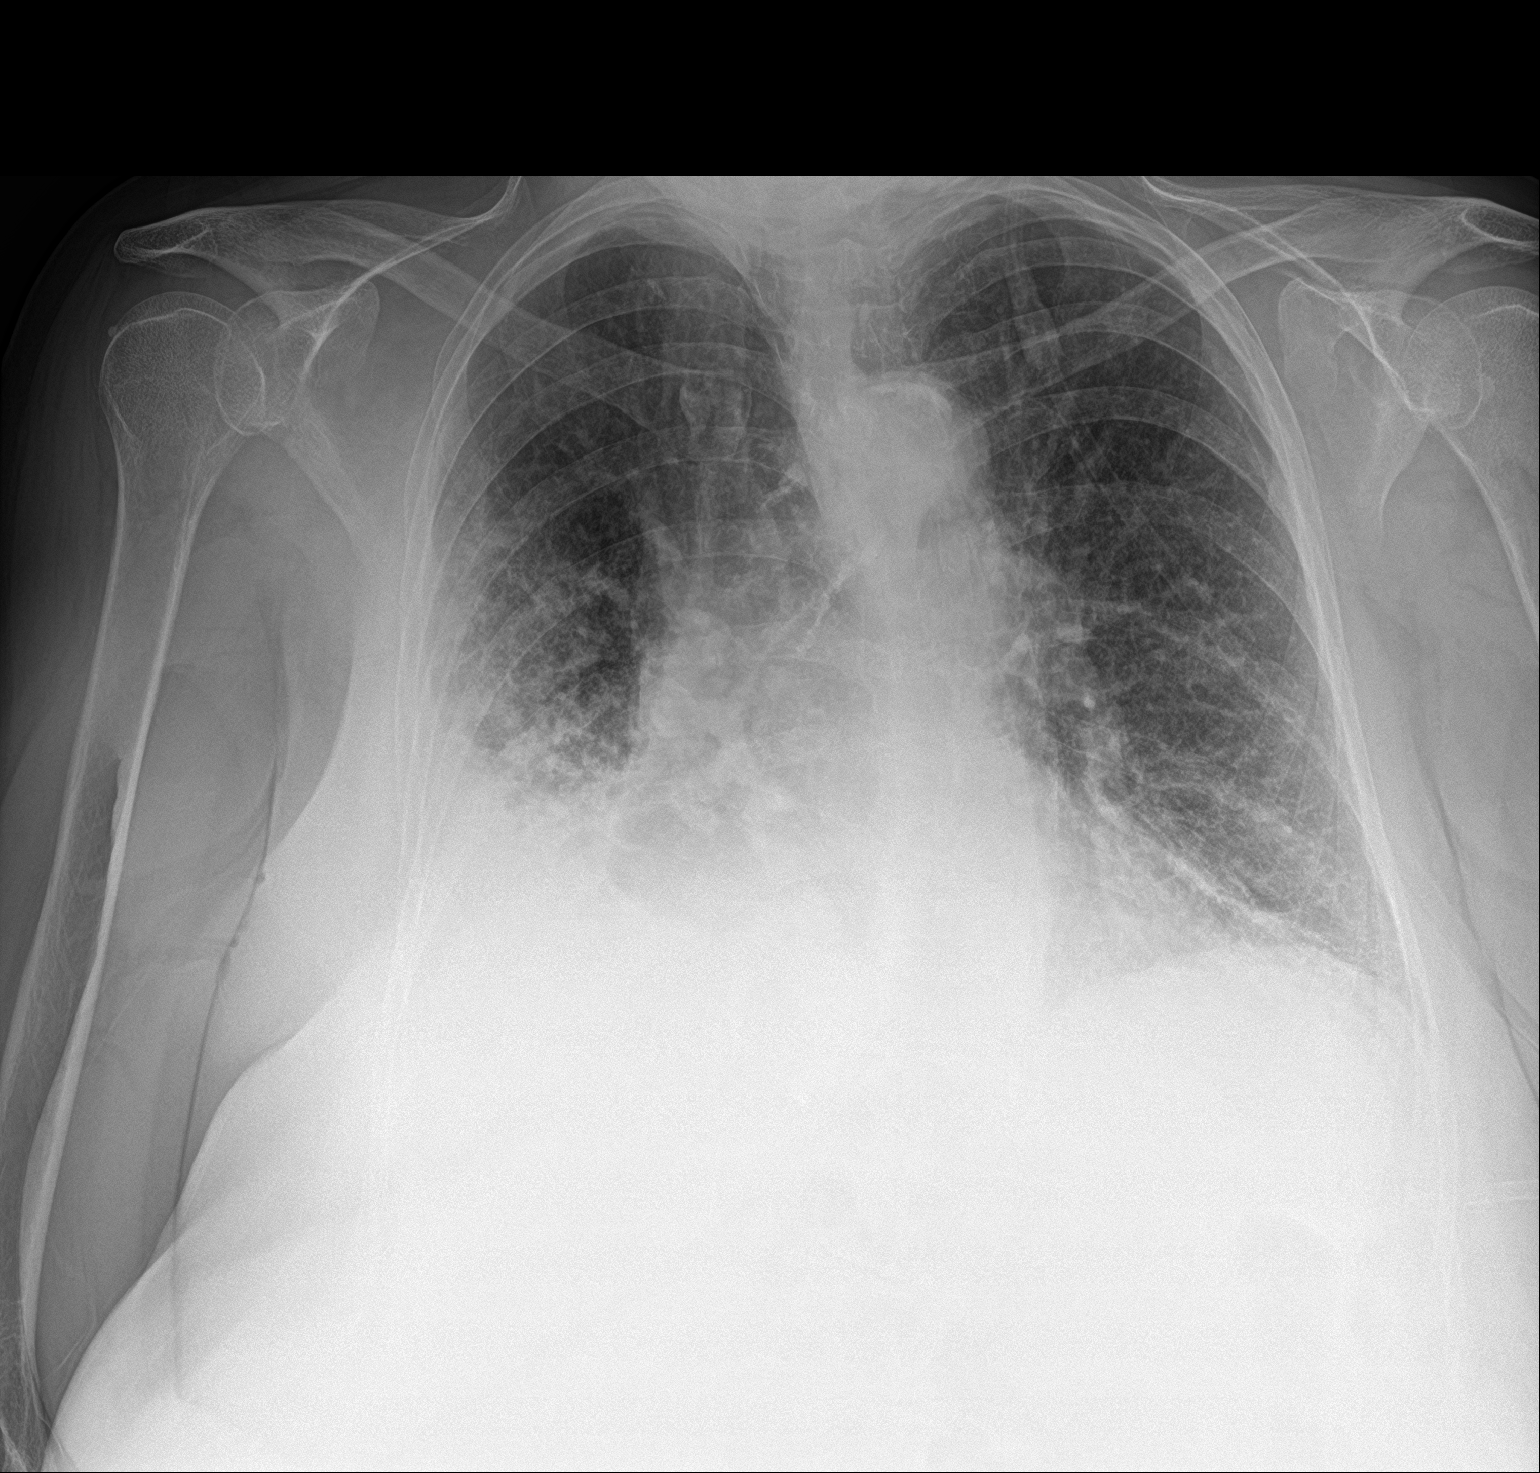

[2 of 2 positions shown; findings below may reference images not displayed]

FINDINGS: Mild cardiomegaly is noted. Bibasilar pneumonia or edema is noted
with small right pleural effusion. Bony thorax is unremarkable.
IMPRESSION: Bibasilar edema or infiltrates are noted with small right pleural
effusion.

## 2021-10-20 MED ORDER — ZINC SULFATE 220 (50 ZN) MG PO CAPS
220.0000 mg | ORAL_CAPSULE | Freq: Every day | ORAL | Status: DC
  Administered 2021-10-20 – 2021-10-22 (×3): 220 mg via ORAL
  Filled 2021-10-20 (×3): qty 1

## 2021-10-20 MED ORDER — COLCHICINE 0.6 MG PO TABS
0.6000 mg | ORAL_TABLET | Freq: Every day | ORAL | Status: DC | PRN
  Filled 2021-10-20: qty 1

## 2021-10-20 MED ORDER — BUSPIRONE HCL 5 MG PO TABS
5.0000 mg | ORAL_TABLET | Freq: Three times a day (TID) | ORAL | Status: DC
  Administered 2021-10-20 – 2021-10-22 (×5): 5 mg via ORAL
  Filled 2021-10-20 (×8): qty 1

## 2021-10-20 MED ORDER — FUROSEMIDE 10 MG/ML IJ SOLN
40.0000 mg | Freq: Every day | INTRAMUSCULAR | Status: DC
  Administered 2021-10-20: 40 mg via INTRAVENOUS
  Filled 2021-10-20 (×2): qty 4

## 2021-10-20 MED ORDER — VANCOMYCIN HCL IN DEXTROSE 1-5 GM/200ML-% IV SOLN
1000.0000 mg | Freq: Once | INTRAVENOUS | Status: AC
  Administered 2021-10-20: 1000 mg via INTRAVENOUS
  Filled 2021-10-20: qty 200

## 2021-10-20 MED ORDER — SODIUM CHLORIDE 0.9 % IV SOLN
500.0000 mg | INTRAVENOUS | Status: DC
  Administered 2021-10-20: 500 mg via INTRAVENOUS
  Filled 2021-10-20 (×2): qty 5

## 2021-10-20 MED ORDER — LORATADINE 10 MG PO TABS
10.0000 mg | ORAL_TABLET | Freq: Every day | ORAL | Status: DC
  Administered 2021-10-20 – 2021-10-22 (×3): 10 mg via ORAL
  Filled 2021-10-20 (×3): qty 1

## 2021-10-20 MED ORDER — SODIUM CHLORIDE 0.9 % IV SOLN
2.0000 g | INTRAVENOUS | Status: DC
  Administered 2021-10-20: 2 g via INTRAVENOUS
  Filled 2021-10-20 (×2): qty 20

## 2021-10-20 MED ORDER — ATORVASTATIN CALCIUM 20 MG PO TABS
80.0000 mg | ORAL_TABLET | Freq: Every day | ORAL | Status: DC
  Administered 2021-10-20 – 2021-10-21 (×2): 80 mg via ORAL
  Filled 2021-10-20 (×2): qty 4

## 2021-10-20 MED ORDER — FLUTICASONE PROPIONATE 50 MCG/ACT NA SUSP
1.0000 | Freq: Every day | NASAL | Status: DC
  Administered 2021-10-21: 1 via NASAL
  Filled 2021-10-20: qty 16

## 2021-10-20 MED ORDER — POTASSIUM CHLORIDE CRYS ER 20 MEQ PO TBCR
20.0000 meq | EXTENDED_RELEASE_TABLET | Freq: Two times a day (BID) | ORAL | Status: DC
  Administered 2021-10-20 – 2021-10-22 (×4): 20 meq via ORAL
  Filled 2021-10-20 (×4): qty 1

## 2021-10-20 MED ORDER — MELATONIN 5 MG PO TABS
5.0000 mg | ORAL_TABLET | Freq: Every day | ORAL | Status: DC
  Administered 2021-10-20 – 2021-10-21 (×2): 5 mg via ORAL
  Filled 2021-10-20 (×2): qty 1

## 2021-10-20 MED ORDER — METOPROLOL SUCCINATE ER 25 MG PO TB24
25.0000 mg | ORAL_TABLET | Freq: Two times a day (BID) | ORAL | Status: DC
  Administered 2021-10-20 – 2021-10-22 (×3): 25 mg via ORAL
  Filled 2021-10-20 (×4): qty 1

## 2021-10-20 MED ORDER — LIDOCAINE 5 % EX PTCH
1.0000 | MEDICATED_PATCH | CUTANEOUS | Status: DC
  Filled 2021-10-20 (×2): qty 1

## 2021-10-20 MED ORDER — ASCORBIC ACID 500 MG PO TABS
500.0000 mg | ORAL_TABLET | Freq: Two times a day (BID) | ORAL | Status: DC
  Administered 2021-10-20 – 2021-10-22 (×4): 500 mg via ORAL
  Filled 2021-10-20 (×4): qty 1

## 2021-10-20 MED ORDER — SODIUM CHLORIDE 0.9% FLUSH: 3.0000 mL | INTRAVENOUS | Status: DC | PRN

## 2021-10-20 MED ORDER — PANTOPRAZOLE SODIUM 40 MG PO TBEC
40.0000 mg | DELAYED_RELEASE_TABLET | Freq: Every day | ORAL | Status: DC
  Administered 2021-10-20 – 2021-10-22 (×3): 40 mg via ORAL
  Filled 2021-10-20 (×3): qty 1

## 2021-10-20 MED ORDER — SODIUM CHLORIDE 0.9% FLUSH
3.0000 mL | Freq: Two times a day (BID) | INTRAVENOUS | Status: DC
  Administered 2021-10-20 – 2021-10-22 (×3): 3 mL via INTRAVENOUS

## 2021-10-20 MED ORDER — ENOXAPARIN SODIUM 40 MG/0.4ML IJ SOSY
40.0000 mg | PREFILLED_SYRINGE | INTRAMUSCULAR | Status: DC
  Administered 2021-10-20 – 2021-10-21 (×2): 40 mg via SUBCUTANEOUS
  Filled 2021-10-20 (×2): qty 0.4

## 2021-10-20 MED ORDER — ACETAMINOPHEN 325 MG PO TABS
650.0000 mg | ORAL_TABLET | Freq: Four times a day (QID) | ORAL | Status: DC | PRN
  Administered 2021-10-20 – 2021-10-22 (×3): 650 mg via ORAL
  Filled 2021-10-20 (×3): qty 2

## 2021-10-20 MED ORDER — SODIUM CHLORIDE 0.9 % IV SOLN
2.0000 g | Freq: Once | INTRAVENOUS | Status: AC
  Administered 2021-10-20: 2 g via INTRAVENOUS
  Filled 2021-10-20: qty 2

## 2021-10-20 MED ORDER — SODIUM CHLORIDE 0.9 % IV SOLN: 250.0000 mL | INTRAVENOUS | Status: DC | PRN

## 2021-10-20 MED ORDER — DULOXETINE HCL 30 MG PO CPEP
60.0000 mg | ORAL_CAPSULE | Freq: Every day | ORAL | Status: DC
  Administered 2021-10-20 – 2021-10-22 (×3): 60 mg via ORAL
  Filled 2021-10-20: qty 2
  Filled 2021-10-20: qty 1
  Filled 2021-10-20: qty 2

## 2021-10-20 MED ORDER — ALBUTEROL SULFATE (2.5 MG/3ML) 0.083% IN NEBU: 3.0000 mL | INHALATION_SOLUTION | RESPIRATORY_TRACT | Status: DC | PRN

## 2021-10-20 MED ORDER — CLOPIDOGREL BISULFATE 75 MG PO TABS
75.0000 mg | ORAL_TABLET | Freq: Every day | ORAL | Status: DC
  Administered 2021-10-20 – 2021-10-22 (×3): 75 mg via ORAL
  Filled 2021-10-20 (×3): qty 1

## 2021-10-20 MED ORDER — ESCITALOPRAM OXALATE 10 MG PO TABS
5.0000 mg | ORAL_TABLET | Freq: Every day | ORAL | Status: DC
  Filled 2021-10-20: qty 0.5
  Filled 2021-10-20: qty 1
  Filled 2021-10-20: qty 0.5

## 2021-10-20 MED ORDER — GABAPENTIN 600 MG PO TABS
600.0000 mg | ORAL_TABLET | Freq: Two times a day (BID) | ORAL | Status: DC
  Administered 2021-10-20 – 2021-10-22 (×4): 600 mg via ORAL
  Filled 2021-10-20 (×4): qty 1

## 2021-10-20 NOTE — ED Triage Notes (Signed)
Pt in via [facility] form home with c/o flu/covid like sx's for the past week. Pt with cough, sore throat, fever and bodyaches. Temp 102.5, EMS gave 1000mg  of tylenol. Pt with hx of COPD. Pt on 2L at 98%, 138/72, HR 78

## 2021-10-20 NOTE — ED Provider Notes (Addendum)
[facility] Emergency Department Provider Note ____________________________________________   Event Date/Time   First MD Initiated Contact with Patient 10/20/21 1009     (approximate)  I have reviewed the triage vital signs and the nursing notes.   HISTORY  Chief Complaint Fever, Cough, Nasal Congestion, and Generalized Body Aches    HPI Samantha King is a 75 y.o. female with PMH as noted below including CAD, hypertension, hyperlipidemia, and COPD who presents with nonproductive cough and nasal congestion over the last week, persistent course, associated with subjective fever and body aches as well as generalized weakness.  The patient denies any significant acute shortness of breath or chest pain.  She denies any leg swelling.  She had some vomiting this morning but has had no abdominal pain or diarrhea.  Past Medical History:  Diagnosis Date   CAD (coronary artery disease)    a. s/p INF-LAT STEMI => s/p DES-RCA c/b VF arrest and cardiogenic shock   Cardiac arrest ([place]) 10/2012   STEMI with VF arrest x 2    GERD (gastroesophageal reflux disease)    H1N1 influenza 10/2012   Hx of echocardiogram    a. Echo 10/30/12: Moderate LVH, EF 55-60%, normal wall motion, PASP 45   Hyperlipidemia    Hypertension    Myocardial infarction ([place])    Pneumonia 10/2012   a. STEMI c/b LLL pneumonia in setting of recent H1N1 influenza   PUD (peptic ulcer disease)    Syncope 1996   reported episode while driving in [place] in 1996 with full cardiac workup = negative   Tobacco abuse     Patient Active Problem List   Diagnosis Date Noted   Healthcare-associated pneumonia 10/20/2021   Nerve pain 08/28/2021   Lumbar discitis 08/07/2021   Septic embolism ([place]) 08/07/2021   Hyponatremia 07/31/2021   Situational anxiety 07/31/2021   Prerenal azotemia 07/31/2021   Abnormal LFTs 07/31/2021   Hypoalbuminemia 07/31/2021   Acute gout    Hyperglycemia     Leukocytosis    Loose stools    Debility 07/10/2021   Post-operative pain    Bacteremia    Acute gout involving toe    Incontinence of feces    Paroxysmal atrial fibrillation (HCC)    Hypoxemia    Back pain 06/19/2021   Septic shock ([place]) 06/19/2021   COPD with acute exacerbation ([place]) 06/19/2021   Acute hypoxemic respiratory failure ([place]) 06/19/2021   Pulmonary nodules 06/19/2021   MSSA bacteremia 06/19/2021   Upper GI bleed 03/10/2020   Nausea 03/10/2020   Duodenal ulcer    Dieulafoy lesion of duodenum    Gastritis and gastroduodenitis    Coronary atherosclerosis of native coronary artery 11/10/2012   Tobacco abuse 11/09/2012   NSVT (nonsustained ventricular tachycardia) 11/09/2012   Acute paranoia ([place]) 11/09/2012   Severe muscle deconditioning 11/09/2012   Hyperlipidemia 11/09/2012   Hypokalemia 11/05/2012   Acute blood loss anemia 11/01/2012   H1N1 influenza with pneumonia 10/31/2012   Hypertension 10/29/2012   GERD (gastroesophageal reflux disease) 10/29/2012   Acute respiratory failure ([place]) 10/29/2012   Cardiogenic shock ([place]) 10/29/2012   Cardiac arrest ([place]) 10/29/2012   ST elevation myocardial infarction (STEMI) of inferior wall ([place]) 10/29/2012    Past Surgical History:  Procedure Laterality Date   BREAST EXCISIONAL BIOPSY     left breast ? 1981   CORONARY ANGIOPLASTY WITH STENT PLACEMENT     s/p inferior STEMI c/b VF arrest and cardiogenic shock requiring IABP   ESOPHAGOGASTRODUODENOSCOPY (EGD)  WITH PROPOFOL N/A 03/10/2020   Procedure: ESOPHAGOGASTRODUODENOSCOPY (EGD) WITH PROPOFOL;  Surgeon: Lavena Bullion, DO;  Location: [facility];  Service: Gastroenterology;  Laterality: N/A;   HEMOSTASIS CLIP PLACEMENT  03/10/2020   Procedure: HEMOSTASIS CLIP PLACEMENT;  Surgeon: Lavena Bullion, DO;  Location: [facility];  Service: Gastroenterology;;   HEMOSTASIS CONTROL  03/10/2020   Procedure: HEMOSTASIS CONTROL;  Surgeon: Lavena Bullion, DO;  Location: [facility]  ENDOSCOPY;  Service: Gastroenterology;;  epi   INTRA-AORTIC BALLOON PUMP INSERTION  10/29/2012   Procedure: INTRA-AORTIC BALLOON PUMP INSERTION;  Surgeon: Burnell Blanks, MD;  Location: [facility] CATH LAB;  Service: Cardiovascular;;   LEFT HEART CATHETERIZATION WITH CORONARY ANGIOGRAM N/A 10/29/2012   Procedure: LEFT HEART CATHETERIZATION WITH CORONARY ANGIOGRAM;  Surgeon: Burnell Blanks, MD;  Location: [facility] CATH LAB;  Service: Cardiovascular;  Laterality: N/A;   PERCUTANEOUS CORONARY STENT INTERVENTION (PCI-S)  10/29/2012   Procedure: PERCUTANEOUS CORONARY STENT INTERVENTION (PCI-S);  Surgeon: Burnell Blanks, MD;  Location: [facility] CATH LAB;  Service: Cardiovascular;;   TUBAL LIGATION      Prior to Admission medications   Medication Sig Start Date End Date Taking? Authorizing Provider  acetaminophen (TYLENOL) 325 MG tablet Take 650 mg by mouth every 6 (six) hours as needed for mild pain or headache. For headache or pain    [provider]  ascorbic acid (VITAMIN C) 500 MG tablet Take 1 tablet (500 mg total) by mouth 2 (two) times daily. 07/31/21   Love, Ivan Anchors, PA-C  atorvastatin (LIPITOR) 80 MG tablet Take 1 tablet (80 mg total) by mouth daily at 6 PM. 07/31/21   Love, Ivan Anchors, PA-C  budesonide-formoterol (SYMBICORT) 160-4.5 MCG/ACT inhaler Inhale 2 puffs into the lungs 2 (two) times daily. 08/01/21   Love, Ivan Anchors, PA-C  busPIRone (BUSPAR) 5 MG tablet Take 1 tablet (5 mg total) by mouth 3 (three) times daily. 09/12/21   Lovorn, Jinny Blossom, MD  clopidogrel (PLAVIX) 75 MG tablet Take 1 tablet (75 mg total) by mouth daily. 07/31/21   Love, Ivan Anchors, PA-C  colchicine 0.6 MG tablet Take 1 tablet (0.6 mg total) by mouth daily as needed (for gout flare). 08/28/21   Lovorn, Jinny Blossom, MD  DULoxetine (CYMBALTA) 60 MG capsule Take 1 capsule (60 mg total) by mouth daily. 08/28/21   Lovorn, Jinny Blossom, MD  escitalopram (LEXAPRO) 5 MG tablet Take 5 mg by mouth daily. 08/23/21   [provider]   fluticasone (FLONASE) 50 MCG/ACT nasal spray Place 1 spray into both nostrils daily. 08/01/21   Love, Ivan Anchors, PA-C  furosemide (LASIX) 40 MG tablet Take 1 tablet (40 mg total) by mouth daily. 08/01/21   Love, Ivan Anchors, PA-C  gabapentin (NEURONTIN) 600 MG tablet Take 1 tablet (600 mg total) by mouth 2 (two) times daily. X  1week, then IF need be, increase to 600 mg 3x/day- (1 in Am/2 at night) 08/28/21   Lovorn, Jinny Blossom, MD  lidocaine (LIDODERM) 5 % Place 1 patch onto the skin daily. Apply to lower back at 7 am and remove at 7 pm daily. 07/31/21   Love, Ivan Anchors, PA-C  loratadine (CLARITIN) 10 MG tablet Take 1 tablet (10 mg total) by mouth daily. 08/01/21   Love, Ivan Anchors, PA-C  methocarbamol (ROBAXIN) 750 MG tablet Take 1 tablet (750 mg total) by mouth every 6 (six) hours as needed for muscle spasms. 09/12/21   Lovorn, Jinny Blossom, MD  metoprolol succinate (TOPROL-XL) 25 MG 24 hr tablet Take 25 mg by mouth 2 (  two) times daily. 06/18/21   [provider]  metoprolol tartrate (LOPRESSOR) 25 MG tablet Take 1 tablet (25 mg total) by mouth 2 (two) times daily. 07/31/21   Love, Ivan Anchors, PA-C  pantoprazole (PROTONIX) 40 MG tablet Take 1 tablet (40 mg total) by mouth daily. 07/31/21 01/27/22  Love, Ivan Anchors, PA-C  potassium chloride SA (KLOR-CON) 20 MEQ tablet Take 1 tablet (20 mEq total) by mouth 2 (two) times daily. Patient not taking: Reported on 08/28/2021 07/31/21   Love, Ivan Anchors, PA-C  Psyllium 58.12 % PACK Mix 1 packet in liquid and drink daily. 08/01/21   Love, Ivan Anchors, PA-C  VENTOLIN HFA 108 (90 Base) MCG/ACT inhaler SMARTSIG:2 Puff(s) By Mouth Every 4-6 Hours PRN 03/16/21   [provider]  Zinc Sulfate 220 (50 Zn) MG TABS Take 1 tablet (220 mg total) by mouth daily. 08/01/21   Love, Ivan Anchors, PA-C    Allergies Shellfish allergy, Codeine, and Lidocaine  Family History  Problem Relation Age of Onset   Lung cancer Father    Heart attack Brother    Stroke Maternal Grandmother    Colon cancer  Neg Hx    Pancreatic cancer Neg Hx    Prostate cancer Neg Hx    Rectal cancer Neg Hx    Stomach cancer Neg Hx     Social History Social History   Tobacco Use   Smoking status: Former    Packs/day: 1.00    Years: 20.00    Pack years: 20.00    Types: Cigarettes    Quit date: 11/05/2012    Years since quitting: 8.9   Smokeless tobacco: Never  Vaping Use   Vaping Use: Never used  Substance Use Topics   Alcohol use: No   Drug use: No    Review of Systems  Constitutional: Positive for fever. Eyes: No visual changes. ENT: Positive for sore throat. Cardiovascular: Denies chest pain. Respiratory: Denies shortness of breath. Gastrointestinal: Positive for vomiting.  No diarrhea.  Genitourinary: Negative for dysuria or frequency.  Musculoskeletal: Positive for body aches. Skin: Negative for rash. Neurological: Positive for headache.   ____________________________________________   PHYSICAL EXAM:  VITAL SIGNS: ED Triage Vitals  Enc Vitals Group     BP 10/20/21 0800 121/83     Pulse Rate 10/20/21 0800 72     Resp 10/20/21 0800 20     Temp 10/20/21 0800 99 F (37.2 C)     Temp Source 10/20/21 0800 Oral     SpO2 10/20/21 0800 92 %     Weight 10/20/21 0732 178 lb (80.7 kg)     Height 10/20/21 0732 5\' 2"  (1.575 m)     Head Circumference --      Peak Flow --      Pain Score 10/20/21 0732 10     Pain Loc --      Pain Edu? --      Excl. in [place]? --     Constitutional: Alert and oriented.  Weak appearing but in no acute distress. Eyes: Conjunctivae are normal.  Head: Atraumatic. Nose: No congestion/rhinnorhea. Mouth/Throat: Mucous membranes are dry.  Oropharynx clear with no erythema or exudate. Neck: Normal range of motion.  Cardiovascular: Normal rate, regular rhythm. Grossly normal heart sounds.  Good peripheral circulation. Respiratory: Increased some respiratory effort.  No retractions.  Diminished breath sounds to bilateral bases with faint  rales. Gastrointestinal: Soft and nontender. No distention.  Genitourinary: No flank tenderness. Musculoskeletal: No lower extremity edema.  Extremities  warm and well perfused.  Neurologic:  Normal speech and language. No gross focal neurologic deficits are appreciated.  Skin:  Skin is warm and dry. No rash noted. Psychiatric: Mood and affect are normal. Speech and behavior are normal.  ____________________________________________   LABS (all labs ordered are listed, but only abnormal results are displayed)  Labs Reviewed  CBC - Abnormal; Notable for the following components:      Result Value   WBC 24.7 (*)    RDW 15.7 (*)    Platelets 435 (*)    All other components within normal limits  BASIC METABOLIC PANEL - Abnormal; Notable for the following components:   Sodium 134 (*)    Potassium 3.0 (*)    Glucose, Bld 145 (*)    All other components within normal limits  BRAIN NATRIURETIC PEPTIDE - Abnormal; Notable for the following components:   B Natriuretic Peptide 1,239.2 (*)    All other components within normal limits  TROPONIN I (HIGH SENSITIVITY) - Abnormal; Notable for the following components:   Troponin I (High Sensitivity) 237 (*)    All other components within normal limits  RESP PANEL BY RT-PCR (FLU A&B, COVID) ARPGX2  CULTURE, BLOOD (ROUTINE X 2)  CULTURE, BLOOD (ROUTINE X 2)  LACTIC ACID, PLASMA  LACTIC ACID, PLASMA  PROCALCITONIN  TROPONIN I (HIGH SENSITIVITY)  TROPONIN I (HIGH SENSITIVITY)   ____________________________________________  EKG ED ECG REPORT I, Arta Silence, the attending physician, personally viewed and interpreted this ECG.  Date: 10/20/2021 EKG Time: 0806 Rate: 97 Rhythm: normal sinus rhythm with frequent PVCs  QRS Axis: normal Intervals: normal ST/T Wave abnormalities: normal Narrative Interpretation: no evidence of acute ischemia  ____________________________________________  RADIOLOGY  Chest x-ray interpreted by me  shows bilateral lower lobe opacities, right pleural effusion  ____________________________________________   PROCEDURES  Procedure(s) performed: No  Procedures  Critical Care performed: No ____________________________________________   INITIAL IMPRESSION / ASSESSMENT AND PLAN / ED COURSE  Pertinent labs & imaging results that were available during my care of the patient were reviewed by me and considered in my medical decision making (see chart for details).   75 year old female with PMH as noted above including CAD, hypertension, hyperlipidemia, and COPD (not on home O2) presents with cough, URI symptoms, body aches, fever, and some intermittent shortness of breath over the last week.  I reviewed the past medical records in Epic; the patient was most recent admitted at [facility] from August to September 0626 with a complicated course involving sepsis from MSSA bacteremia, and possible discitis.  She had respiratory failure due to acute diastolic CHF and pneumonia thought to be due to pulmonary septic emboli and presumed endocarditis.  She was on a 6 week course of cefazolin.  On exam today the patient demonstrates increased work of breathing but no acute respiratory distress.  O2 saturation is in the mid 80s on room air and in the low 90s on 2 L O2.  There is no peripheral edema.  Chest x-ray obtained from triage shows bilateral infiltrates new since September and the respiratory panel is negative.  Overall presentation is consistent with pneumonia.  The patient has no other focal signs or symptoms to suggest other source of infection.  Given her admission less than 3 months ago and history of sepsis and bacteremia, we will treat for HCAP.  Given the hypoxia we will plan for admission.  ----------------------------------------- 12:27 PM on 10/20/2021 -----------------------------------------  Lactate is normal.  Procalcitonin and BNP are pending.  I consulted the hospitalist for  admission.  ----------------------------------------- 1:21 PM on 10/20/2021 -----------------------------------------  Initial troponin is elevated.  EKG does not show acute ischemic findings, the patient has no chest pain or active shortness of breath.  This is consistent with demand ischemia.  There is no indication for acute intervention.  ____________________________________________   FINAL CLINICAL IMPRESSION(S) / ED DIAGNOSES  Final diagnoses:  HCAP (healthcare-associated pneumonia)      NEW MEDICATIONS STARTED DURING THIS VISIT:  New Prescriptions   No medications on file     Note:  This document was prepared using Dragon voice recognition software and may include unintentional dictation errors.    Arta Silence, MD 10/20/21 1228    Arta Silence, MD 10/20/21 1322

## 2021-10-20 NOTE — ED Notes (Signed)
Pt normally takes melatonin at night and requested that she receive something at night. Notified IP pm nurse.

## 2021-10-20 NOTE — ED Notes (Signed)
Pt noted to have urinated in bed, chux pad and underwear changed by this tech. Peri care also performed by this tech. Pt placed on purewick by [employer], Therapist, sports. Pt tolerated well and purewick is functioning correctly. Pt is now resting comfortably, water and snacks at bedside. Will continue to monitor.

## 2021-10-20 NOTE — ED Notes (Signed)
Pt given crackers, sprite and peanut butter.
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
## 2021-10-20 NOTE — ED Notes (Signed)
MD Siadecki informed of pt's trop level of 237.

## 2021-10-20 NOTE — ED Notes (Signed)
Pt's O2 at 86 % RA. Pt placed back on 2L [place] at this time.

## 2021-10-20 NOTE — Consult Note (Signed)
PHARMACY -  BRIEF ANTIBIOTIC NOTE   Pharmacy has received consult(s) for Vancomycin and Cefepime from an ED provider.  The patient's profile has been reviewed for ht/wt/allergies/indication/available labs.    One time order(s) placed for Vancomycin 1g IV and Cefepime 2g IV x 1 dose each.  Further antibiotics/pharmacy consults should be ordered by admitting physician if indicated.                       Thank you, Pearla Dubonnet 10/20/2021  11:07 AM

## 2021-10-20 NOTE — ED Notes (Signed)
Pt's IV noted to be infliltrated. Loree Fee, RN notified by this tech and paused IV infusion per RN. Pt also given diet coke by this tech.

## 2021-10-20 NOTE — ED Triage Notes (Signed)
Pt reports sick for about a week with fever, cough, congestion sore throat and bodyaches and chills. Pt denies any recent exposures. Pt reports feels SOB and has hx of COPD.

## 2021-10-21 ENCOUNTER — Ambulatory Visit: Payer: Medicare Other | Admitting: Physical Therapy

## 2021-10-21 ENCOUNTER — Inpatient Hospital Stay: Payer: Medicare Other

## 2021-10-21 ENCOUNTER — Encounter: Payer: Self-pay | Admitting: Family Medicine

## 2021-10-21 LAB — BASIC METABOLIC PANEL
Anion gap: 11 (ref 5–15)
BUN: 14 mg/dL (ref 8–23)
CO2: 24 mmol/L (ref 22–32)
Calcium: 9.3 mg/dL (ref 8.9–10.3)
Chloride: 97 mmol/L — ABNORMAL LOW (ref 98–111)
Creatinine, Ser: 0.74 mg/dL (ref 0.44–1.00)
GFR, Estimated: >60 mL/min (ref >60)
Glucose, Bld: 115 mg/dL — ABNORMAL HIGH (ref 70–99)
Potassium: 3.4 mmol/L — ABNORMAL LOW (ref 3.5–5.1)
Sodium: 132 mmol/L — ABNORMAL LOW (ref 135–145)

## 2021-10-21 LAB — RESPIRATORY PANEL BY PCR

## 2021-10-21 LAB — CBC
HCT: 35.4 % — ABNORMAL LOW (ref 36.0–46.0)
Hemoglobin: 11.3 g/dL — ABNORMAL LOW (ref 12.0–15.0)
MCH: 28.5 pg (ref 26.0–34.0)
MCHC: 31.9 g/dL (ref 30.0–36.0)
MCV: 89.2 fL (ref 80.0–100.0)
Platelets: 426 10*3/uL — ABNORMAL HIGH (ref 150–400)
RBC: 3.97 MIL/uL (ref 3.87–5.11)
RDW: 15.7 % — ABNORMAL HIGH (ref 11.5–15.5)
WBC: 19.2 10*3/uL — ABNORMAL HIGH (ref 4.0–10.5)
nRBC: 0 % (ref 0.0–0.2)

## 2021-10-21 LAB — TROPONIN I (HIGH SENSITIVITY): Troponin I (High Sensitivity): 175 ng/L (ref <18)

## 2021-10-21 LAB — STREP PNEUMONIAE URINARY ANTIGEN: Strep Pneumo Urinary Antigen: NEGATIVE

## 2021-10-21 LAB — BRAIN NATRIURETIC PEPTIDE: B Natriuretic Peptide: 829.4 pg/mL — ABNORMAL HIGH (ref 0.0–100.0)

## 2021-10-21 LAB — MRSA NEXT GEN BY PCR, NASAL: MRSA by PCR Next Gen: NOT DETECTED

## 2021-10-21 IMAGING — CT CT CHEST W/O CM
2 of 4 series · 15 of 36 positions shown, 18 images · non-contrast
Comparison: Chest x-ray from [DATE].

CLINICAL DATA: Suspect pneumonia in a 75-year-old female.

EXAM:
CT CHEST WITHOUT CONTRAST
TECHNIQUE: Multidetector CT imaging of the chest was performed following the
standard protocol without IV contrast.

[Series 2: thorax · axial · 0.68mm/px · z∈[-594,-304]mm · 12 of 173 slices shown, 15 images]
[im 14/173  mediastinal]
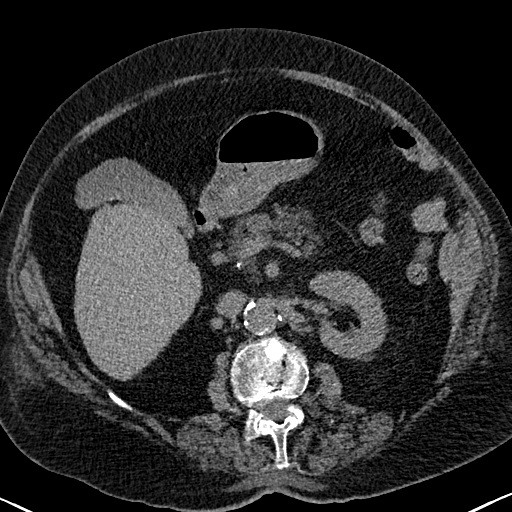
[im 14/173  lung]
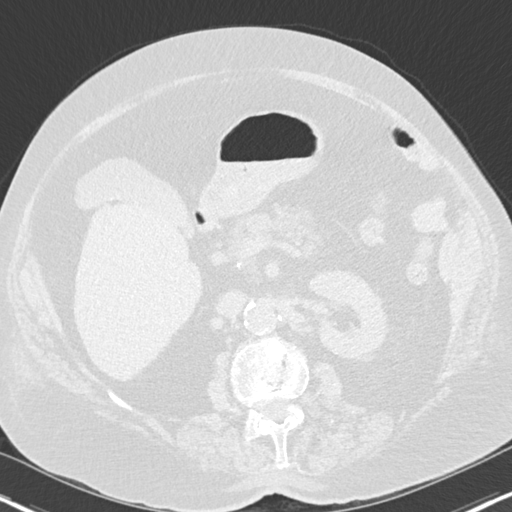
[im 27/173  lung]
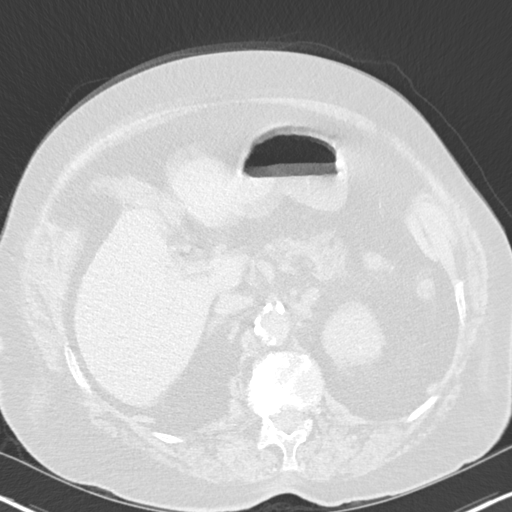
[im 40/173  lung]
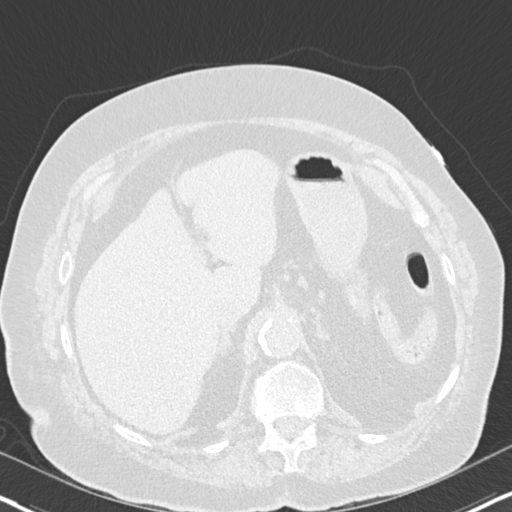
[im 53/173  lung]
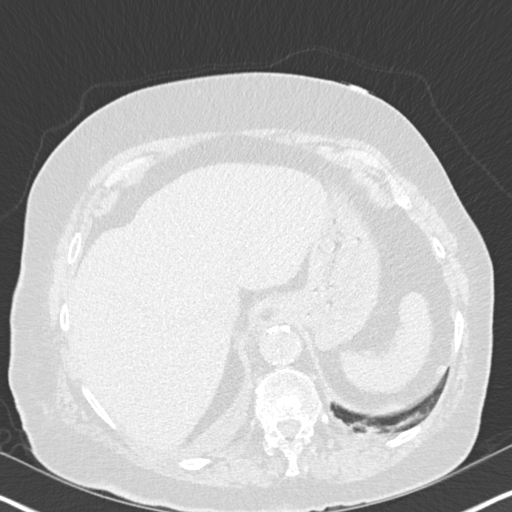
[im 67/173  mediastinal]
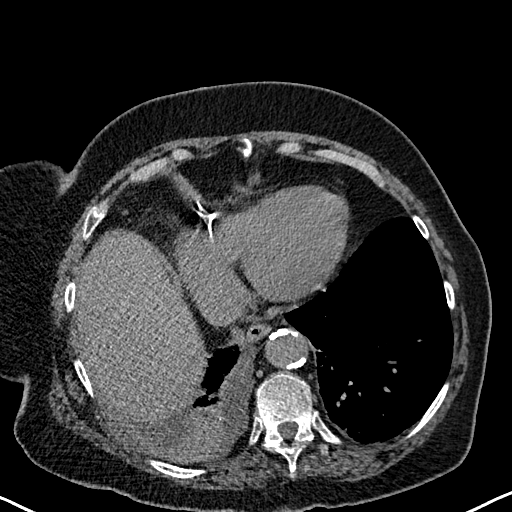
[im 67/173  lung]
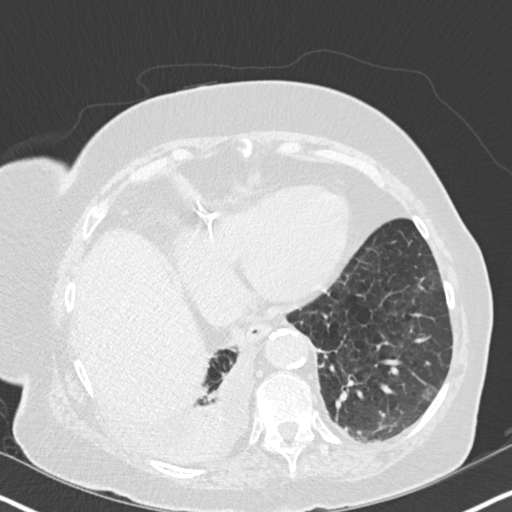
[im 80/173  lung]
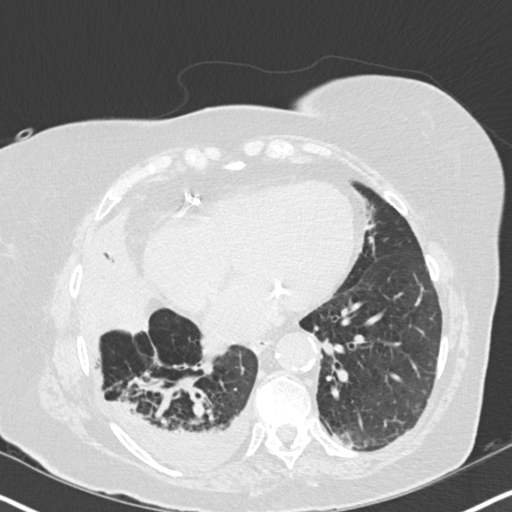
[im 93/173  lung]
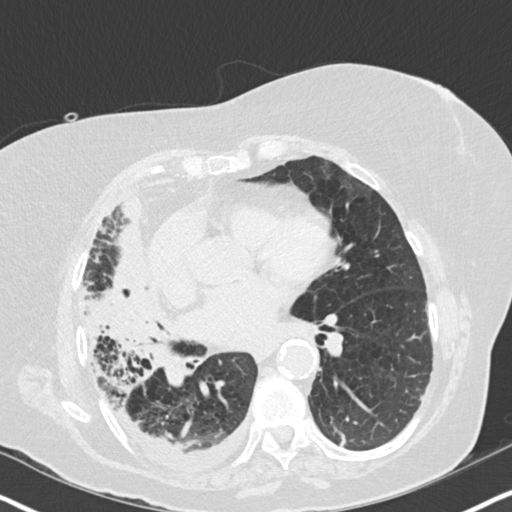
[im 106/173  lung]
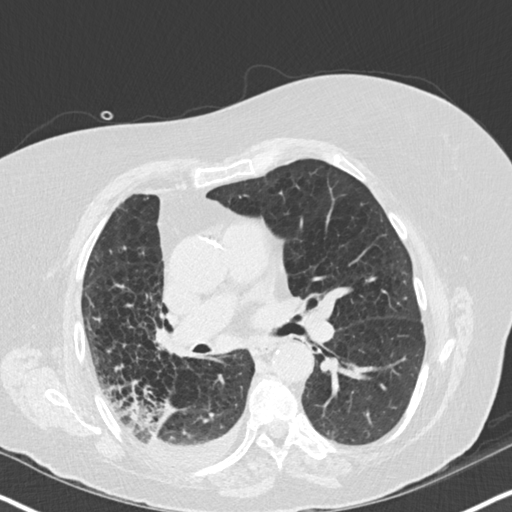
[im 120/173  mediastinal]
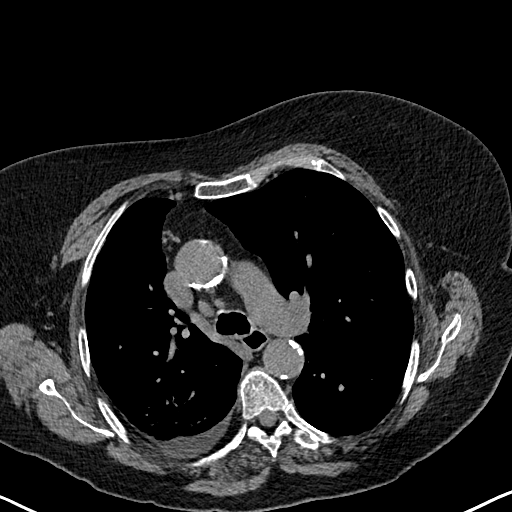
[im 120/173  lung]
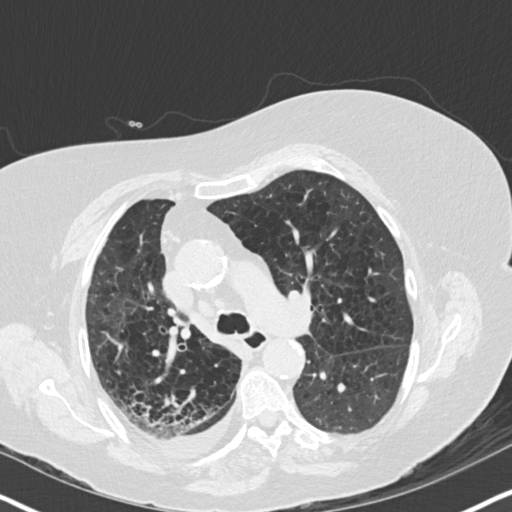
[im 133/173  lung]
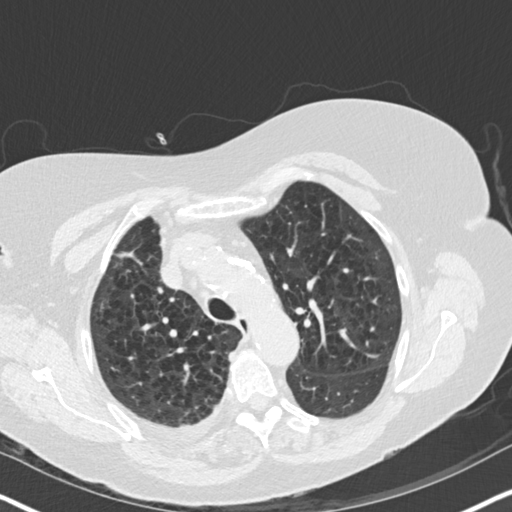
[im 146/173  lung]
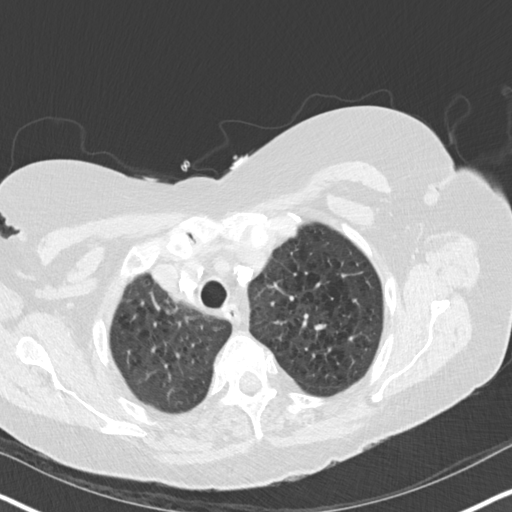
[im 159/173  lung]
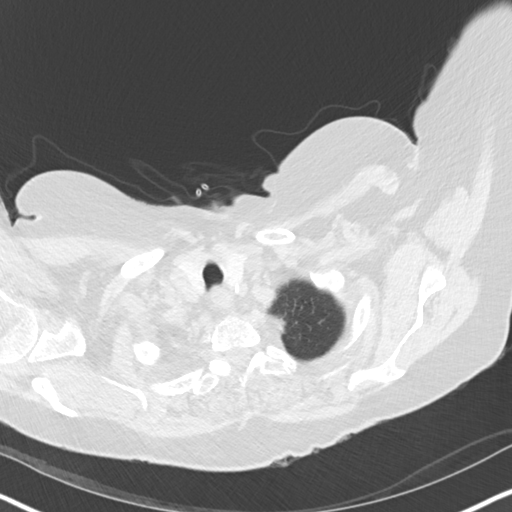

[Series 5: coronal · coronal · 0.74mm/px · 3 of 178 slices shown]
[im 36/178  lung]
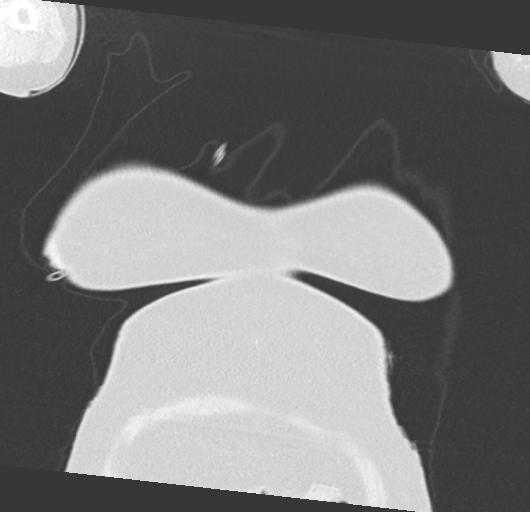
[im 71/178  lung]
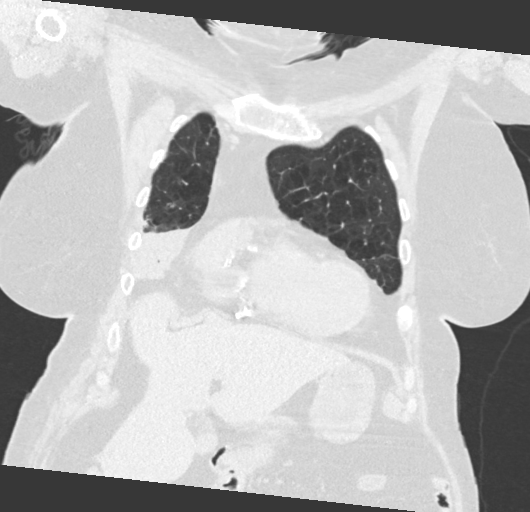
[im 107/178  lung]
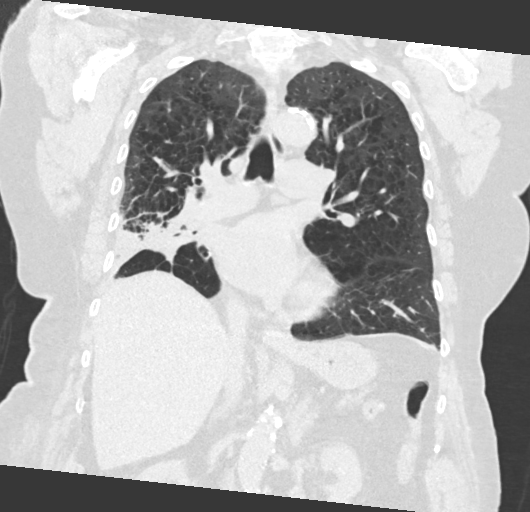

[15 of 36 positions shown; findings below may reference images not displayed]

FINDINGS: Cardiovascular: Calcified atheromatous plaque of the thoracic aorta.
Calcified coronary artery disease. Moderate cardiac enlargement
without substantial pericardial effusion. Normal caliber of central
pulmonary vessels. Limited assessment of cardiovascular structures
given lack of intravenous contrast.

Mediastinum/Nodes: Thoracic inlet structures are unremarkable. No
axillary lymphadenopathy.

Lungs/Pleura: Dense RIGHT middle lobe consolidation. Lower lobe
consolidation as well. Areas of septal thickening in the middle lobe
and upper lobe in the RIGHT chest. Associated RIGHT-sided, para in a
Modic effusion is small. No pneumothorax. No LEFT-sided
consolidation. Airways are patent.

Upper Abdomen: No acute upper abdominal process to the extent
evaluated.

Musculoskeletal: No acute bone finding. No destructive bone process.
Spinal degenerative changes.
IMPRESSION: RIGHT middle lobe pneumonia with pneumonitis also in the upper and
lower lobes.

Small RIGHT-sided RIGHT-sided parapneumonic effusion.

Suggest follow-up to ensure resolution of above findings.

Background pulmonary emphysema as before.

Aortic atherosclerosis

Small RIGHT pleural effusion.

Cardiomegaly and coronary artery disease.

Aortic Atherosclerosis ([5N]-[5N]) and Emphysema ([5N]-[5N]).

## 2021-10-21 MED ORDER — LEVOFLOXACIN IN D5W 750 MG/150ML IV SOLN
750.0000 mg | INTRAVENOUS | Status: DC
  Administered 2021-10-21: 750 mg via INTRAVENOUS
  Filled 2021-10-21 (×2): qty 150

## 2021-10-21 MED ORDER — FLUTICASONE FUROATE-VILANTEROL 200-25 MCG/ACT IN AEPB
1.0000 | INHALATION_SPRAY | Freq: Every day | RESPIRATORY_TRACT | Status: DC
  Administered 2021-10-21 – 2021-10-22 (×2): 1 via RESPIRATORY_TRACT
  Filled 2021-10-21: qty 28

## 2021-10-21 MED ORDER — PREDNISONE 20 MG PO TABS
40.0000 mg | ORAL_TABLET | Freq: Every day | ORAL | Status: DC
  Administered 2021-10-21 – 2021-10-22 (×2): 40 mg via ORAL
  Filled 2021-10-21 (×2): qty 2

## 2021-10-21 MED ORDER — IPRATROPIUM-ALBUTEROL 0.5-2.5 (3) MG/3ML IN SOLN
3.0000 mL | Freq: Four times a day (QID) | RESPIRATORY_TRACT | Status: DC
  Administered 2021-10-21 – 2021-10-22 (×6): 3 mL via RESPIRATORY_TRACT
  Filled 2021-10-21 (×6): qty 3

## 2021-10-21 MED ORDER — ORAL CARE MOUTH RINSE
15.0000 mL | Freq: Two times a day (BID) | OROMUCOSAL | Status: DC
  Administered 2021-10-22: 15 mL via OROMUCOSAL

## 2021-10-21 MED ORDER — FUROSEMIDE 40 MG PO TABS
40.0000 mg | ORAL_TABLET | Freq: Every day | ORAL | Status: DC
  Administered 2021-10-22: 40 mg via ORAL
  Filled 2021-10-21: qty 1

## 2021-10-21 NOTE — Progress Notes (Signed)
PROGRESS NOTE    Samantha King  ATF:[id number] DOB: December 22, 1945 DOA: 10/20/2021 PCP: Haywood Pao, MD  Outpatient Specialists: cardiology    Brief Narrative:   Samantha King is a 76 y.o. female with medical history significant for coronary artery disease, MI status post cardiac arrest hypertension, hyperlipidemia, and COPD not on home O2 who presented with nonproductive cough nasal congestion and body aches generalized weakness and subjective fever.  She stated her fever was as high as 103.  She denies significant shortness of breath or chest pain or leg swelling but she has chronic edema for which she takes Lasix.  Denies any abdominal pain or diarrhea.  She had some vomiting this morning.  Patient stated she had a history of MI in December 2013 and she had a prolonged hospital stay then.   Assessment & Plan:   Principal Problem:   Healthcare-associated pneumonia Active Problems:   Hypertension   GERD (gastroesophageal reflux disease)   Acute respiratory failure (HCC)   Hypokalemia   Hyperlipidemia   Coronary atherosclerosis of native coronary artery   Gastritis and gastroduodenitis   Back pain   COPD with acute exacerbation (HCC)   Acute hypoxemic respiratory failure (HCC)   Paroxysmal atrial fibrillation ([place])   HCAP (healthcare-associated pneumonia)  # Community acquired pneumonia One week uri symptoms. CXR w/ bibasilar infiltrate. No fever but does have leukocytosis, elevated WBC, elevated procal. Covid/flu neg. New O2 requirement. Did finish prolonged course of IV abx for bacteremia in september - f/u legionella and strep urine antigens - f/u mrsa screen - narrow to levofloxacin - copd as below - sputum for culture if able to produce (non-productive cough currently) - f/u respiratory panel - CT to further eval small effusion seen on CXR - PT eval  # Acute hypoxic respiratory failure Not on o2 at home and says normal o2 is mid-90s. Here pursed-lip  breathing which she says is her baseline, with o2 in 80s on room air improved to normal w/ 2 L - [place] o2, wean as able - IS  # COPD with exacerbation - levaquin as above - add prednisone daily and duonebs - home symbicort  # Elevated troponin Elevated to 278 on arrival, trending down to 225 last check. Was in the 40s several months ago. No chest pain, denies shortness of breath. No ischemic changes on ekg. Suspect demand from CAP - repeat troponin, if not continuing to trend down would involve cardiology, would also further pursue treatment if develops symptoms - otherwise will need close cardiology f/u as outpt  # CAD # History NSTEMI # HFpEF # Mild/moderate aortic stenosis History STEMI in 2013 treated with one DES. Recent TTE with diastolic dysfunction, normal EF, moderate AS. Appears compensated - cont home metoprolol, lasix, plavix, statin  # Chronic pain # GAD - home buspar, lexapro, gabapentin   DVT prophylaxis: lovenox Code Status: full Family Communication: husband updated @ bedside 12/12  Level of care: Med-Surg Status is: Inpatient  Remains inpatient appropriate because: severity of illness        Consultants:  none  Procedures: none  Antimicrobials:  S/p vanc/cefepime/ceftriaxone/azithromycin on 12/11 Levofloxacin 12/12>    Subjective: Feeling improved. No chest pain, doesn't feel short of breath. Energy improved. No n/v/d. No dysuria.  Objective: Vitals:   10/20/21 2005 10/20/21 2050 10/21/21 0510 10/21/21 0800  BP: 112/80 115/74 (!) 116/99 127/78  Pulse: 100 (!) 104 79 91  Resp: 19 16 20 18   Temp: 97.8 F (36.6 C) 97.6  F (36.4 C) 97.7 F (36.5 C) 97.6 F (36.4 C)  TempSrc: Oral Oral Oral Oral  SpO2: 95% 94% 94% 96%  Weight:      Height:        Intake/Output Summary (Last 24 hours) at 10/21/2021 0918 Last data filed at 10/21/2021 0500 Gross per 24 hour  Intake 350 ml  Output 1000 ml  Net -650 ml   Filed Weights   10/20/21  0732 10/20/21 0800  Weight: 80.7 kg 79.8 kg    Examination:  General exam: Appears calm and comfortable  Respiratory system: few scattered exp wheeze and rhonchi. Pursed lip breathing Cardiovascular system: S1 & S2 heard, RRR. Soft systolic murmur, distant heart sounds Gastrointestinal system: Abdomen is nondistended, soft and nontender. No organomegaly or masses felt. Normal bowel sounds heard. Central nervous system: Alert and oriented. No focal neurological deficits. Extremities: Symmetric 5 x 5 power. No edema Skin: No rashes, lesions or ulcers Psychiatry: Judgement and insight appear normal. Mood & affect appropriate.     Data Reviewed: I have personally reviewed following labs and imaging studies  CBC: Recent Labs  Lab 10/20/21 0808 10/20/21 2020 10/21/21 0414  WBC 24.7* 22.7* 19.2*  HGB 12.2 11.3* 11.3*  HCT 37.5 35.0* 35.4*  MCV 89.3 89.7 89.2  PLT 435* 395 893*   Basic Metabolic Panel: Recent Labs  Lab 10/20/21 0808 10/20/21 2020 10/21/21 0414  NA 134*  --  132*  K 3.0*  --  3.4*  CL 101  --  97*  CO2 22  --  24  GLUCOSE 145*  --  115*  BUN 16  --  14  CREATININE 0.78 0.69 0.74  CALCIUM 9.4  --  9.3   GFR: Estimated Creatinine Clearance: 57.6 mL/min (by C-G formula based on SCr of 0.74 mg/dL). Liver Function Tests: No results for input(s): AST, ALT, ALKPHOS, BILITOT, PROT, ALBUMIN in the last 168 hours. No results for input(s): LIPASE, AMYLASE in the last 168 hours. No results for input(s): AMMONIA in the last 168 hours. Coagulation Profile: No results for input(s): INR, PROTIME in the last 168 hours. Cardiac Enzymes: No results for input(s): CKTOTAL, CKMB, CKMBINDEX, TROPONINI in the last 168 hours. BNP (last 3 results) No results for input(s): PROBNP in the last 8760 hours. HbA1C: No results for input(s): HGBA1C in the last 72 hours. CBG: No results for input(s): GLUCAP in the last 168 hours. Lipid Profile: No results for input(s): CHOL, HDL,  LDLCALC, TRIG, CHOLHDL, LDLDIRECT in the last 72 hours. Thyroid Function Tests: No results for input(s): TSH, T4TOTAL, FREET4, T3FREE, THYROIDAB in the last 72 hours. Anemia Panel: No results for input(s): VITAMINB12, FOLATE, FERRITIN, TIBC, IRON, RETICCTPCT in the last 72 hours. Urine analysis:    Component Value Date/Time   COLORURINE STRAW (A) 07/15/2021 1500   APPEARANCEUR CLEAR 07/15/2021 1500   LABSPEC 1.011 07/15/2021 1500   PHURINE 5.0 07/15/2021 1500   GLUCOSEU NEGATIVE 07/15/2021 1500   HGBUR NEGATIVE 07/15/2021 1500   BILIRUBINUR NEGATIVE 07/15/2021 1500   KETONESUR NEGATIVE 07/15/2021 1500   PROTEINUR NEGATIVE 07/15/2021 1500   NITRITE NEGATIVE 07/15/2021 1500   LEUKOCYTESUR NEGATIVE 07/15/2021 1500   Sepsis Labs: @LABRCNTIP (procalcitonin:4,lacticidven:4)  ) Recent Results (from the past 240 hour(s))  Resp Panel by RT-PCR (Flu A&B, Covid) Nasopharyngeal Swab     Status: None   Collection Time: 10/20/21  8:08 AM   Specimen: Nasopharyngeal Swab; Nasopharyngeal(NP) swabs in vial transport medium  Result Value Ref Range Status   SARS Coronavirus 2  by RT PCR NEGATIVE NEGATIVE Final    Comment: (NOTE) SARS-CoV-2 target nucleic acids are NOT DETECTED.  The SARS-CoV-2 RNA is generally detectable in upper respiratory specimens during the acute phase of infection. The lowest concentration of SARS-CoV-2 viral copies this assay can detect is 138 copies/mL. A negative result does not preclude SARS-Cov-2 infection and should not be used as the sole basis for treatment or other patient management decisions. A negative result may occur with  improper specimen collection/handling, submission of specimen other than nasopharyngeal swab, presence of viral mutation(s) within the areas targeted by this assay, and inadequate number of viral copies(<138 copies/mL). A negative result must be combined with clinical observations, patient history, and epidemiological information. The  expected result is Negative.  Fact Sheet for Patients:  EntrepreneurPulse.com.au  Fact Sheet for Healthcare Providers:  IncredibleEmployment.be  This test is no t yet approved or cleared by the [place] FDA and  has been authorized for detection and/or diagnosis of SARS-CoV-2 by FDA under an Emergency Use Authorization (EUA). This EUA will remain  in effect (meaning this test can be used) for the duration of the COVID-19 declaration under Section 564(b)(1) of the Act, 21 U.S.C.section 360bbb-3(b)(1), unless the authorization is terminated  or revoked sooner.       Influenza A by PCR NEGATIVE NEGATIVE Final   Influenza B by PCR NEGATIVE NEGATIVE Final    Comment: (NOTE) The Xpert Xpress SARS-CoV-2/FLU/RSV plus assay is intended as an aid in the diagnosis of influenza from Nasopharyngeal swab specimens and should not be used as a sole basis for treatment. Nasal washings and aspirates are unacceptable for Xpert Xpress SARS-CoV-2/FLU/RSV testing.  Fact Sheet for Patients: EntrepreneurPulse.com.au  Fact Sheet for Healthcare Providers: IncredibleEmployment.be  This test is not yet approved or cleared by the [place] FDA and has been authorized for detection and/or diagnosis of SARS-CoV-2 by FDA under an Emergency Use Authorization (EUA). This EUA will remain in effect (meaning this test can be used) for the duration of the COVID-19 declaration under Section 564(b)(1) of the Act, 21 U.S.C. section 360bbb-3(b)(1), unless the authorization is terminated or revoked.  Performed at [facility], [address] [place], [place] [address]   Culture, blood (routine x 2)     Status: None (Preliminary result)   Collection Time: 10/20/21 10:17 AM   Specimen: BLOOD  Result Value Ref Range Status   Specimen Description BLOOD RIGHT ANTECUBITAL  Final   Special Requests   Final    BOTTLES DRAWN  AEROBIC AND ANAEROBIC Blood Culture adequate volume   Culture   Final    NO GROWTH < 24 HOURS Performed at [facility], [address], [place], [place] [address]    Report Status PENDING  Incomplete  Culture, blood (routine x 2)     Status: None (Preliminary result)   Collection Time: 10/20/21 10:25 AM   Specimen: BLOOD  Result Value Ref Range Status   Specimen Description BLOOD BLOOD RIGHT FOREARM  Final   Special Requests   Final    BOTTLES DRAWN AEROBIC AND ANAEROBIC Blood Culture adequate volume   Culture   Final    NO GROWTH < 24 HOURS Performed at [facility], [address] [place], [place] [address]    Report Status PENDING  Incomplete         Radiology Studies: DG Chest 2 View  Result Date: 10/20/2021 CLINICAL DATA:  Shortness of breath. EXAM: CHEST - 2 VIEW COMPARISON:  July 16, 2021. FINDINGS: Mild cardiomegaly is noted. Bibasilar pneumonia or edema is noted with small right pleural effusion. Bony thorax is unremarkable. IMPRESSION: Bibasilar edema or infiltrates are noted with small right pleural effusion. Electronically Signed   By: Marijo Conception M.D.   On: 10/20/2021 08:00        Scheduled Meds:  ascorbic acid  500 mg Oral BID   atorvastatin  80 mg Oral q1800   busPIRone  5 mg Oral TID   clopidogrel  75 mg Oral Daily   DULoxetine  60 mg Oral Daily   enoxaparin (LOVENOX) injection  40 mg Subcutaneous Q24H   escitalopram  5 mg Oral Daily   fluticasone  1 spray Each Nare Daily   furosemide  40 mg Intravenous Daily   gabapentin  600 mg Oral BID   lidocaine  1 patch Transdermal Q24H   loratadine  10 mg Oral Daily   melatonin  5 mg Oral QHS   metoprolol succinate  25 mg Oral BID   pantoprazole  40 mg Oral Daily   potassium chloride SA  20 mEq Oral BID   sodium chloride flush  3 mL Intravenous Q12H   zinc sulfate  220 mg Oral Daily   Continuous Infusions:  sodium chloride     azithromycin Stopped (10/20/21 2017)    cefTRIAXone (ROCEPHIN)  IV Stopped (10/20/21 1759)     LOS: 1 day    Time spent: 24 min    Desma Maxim, MD [facility]   If 7PM-7AM, please contact night-coverage [email or web address] Password [email or web address] 10/21/2021, 9:18 AM

## 2021-10-21 NOTE — Progress Notes (Signed)
[facility] - No Economist Note  Received message from nursing staff: Patient has metoprolol ordered but BP is 85/56 and heart rate 56. Can we hold it?Patient has metoprolol ordered but BP is 85/56 and heart rate 56. Can we hold it?  Decision is made to hold on metoprolol succinate at this time.  Dr. Tobie Poet

## 2021-10-21 NOTE — Evaluation (Signed)
Physical Therapy Evaluation Patient Details Name: Samantha King MRN: [id number] DOB: 01/22/1946 Today's Date: 10/21/2021  History of Present Illness  presented to ER secondary to fever, cough, generalized body aches; admitted for management of HCAP  Clinical Impression  Patient resting in bed upon arrival to room; alert and oriented, follows commands and eager for OOB activities.  Denies pain; generally concerned with limited mobility since admission (doesn't want to lose mobility).  Bilat UE/LE Strength and ROM grossly symmetrical and WFL; no focal weakness appreciated.  Able to complete bed mobility with mod indep; sit/stand, basic transfers and gait (50' x1, 75' x1) with 4WRW, cga/close sup.  Demonstrates reciprocal stepping pattern with fair step height/length; mild forward trunk flexion (due to position of 4WRW).  Fair turning radius and control with turn management. No buckling/LOB; mild SOB (BORG 5/10 after distance).  Mild reports of dizziness with initial transition to upright; improved/resolved with accommodation to position.  Vitals stable and WFL; maintains sats >94% on 4L at rest and with activity. Would benefit from skilled PT to address above deficits and promote optimal return to PLOF.; recommend transition to home with continuation of outpatient physical therapy upon discharge (will need new order to resume care).     Recommendations for follow up therapy are one component of a multi-disciplinary discharge planning process, led by the attending physician.  Recommendations may be updated based on patient status, additional functional criteria and insurance authorization.  Follow Up Recommendations Outpatient PT    Assistance Recommended at Discharge PRN  Functional Status Assessment Patient has had a recent decline in their functional status and demonstrates the ability to make significant improvements in function in a reasonable and predictable amount of time.  Equipment  Recommendations       Recommendations for Other Services       Precautions / Restrictions Precautions Precautions: None Restrictions Weight Bearing Restrictions: No      Mobility  Bed Mobility Overal bed mobility: Modified Independent                  Transfers Overall transfer level: Needs assistance Equipment used: Rollator (4 wheels) Transfers: Sit to/from Stand Sit to Stand: Min guard           General transfer comment: cuing for hand placement with movement transitions    Ambulation/Gait Ambulation/Gait assistance: Min guard;Supervision Gait Distance (Feet):  (50' x1, 75' x1) Assistive device: Rollator (4 wheels)         General Gait Details: reciprocal stepping pattern with fair step height/length; mild forward trunk flexion (due to position of 4WRW).  Fair turning radius and control with turn management. No buckling/LOB; mild SOB (BORG 5/10 after distance)  Stairs            Wheelchair Mobility    Modified Rankin (Stroke Patients Only)       Balance Overall balance assessment: Needs assistance Sitting-balance support: No upper extremity supported;Feet supported Sitting balance-Leahy Scale: Good     Standing balance support: Bilateral upper extremity supported Standing balance-Leahy Scale: Fair                               Pertinent Vitals/Pain Pain Assessment: No/denies pain Breathing: normal Negative Vocalization: none Facial Expression: smiling or inexpressive Body Language: relaxed Consolability: no need to console PAINAD Score: 0    Home Living Family/patient expects to be discharged to:: Private residence Living Arrangements: Spouse/significant other Available Help at Discharge: Family;Available  24 hours/day Type of Home: House Home Access: Stairs to enter Entrance Stairs-Rails: Right Entrance Stairs-Number of Steps: 3   Home Layout: One level Home Equipment: Rollator (4 wheels) Additional Comments:  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; 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       Hand Dominance        Extremity/Trunk Assessment   Upper Extremity Assessment Upper Extremity Assessment: Overall WFL for tasks assessed    Lower Extremity Assessment Lower Extremity Assessment: Generalized weakness (grossly 4-/5 throughout bilat LEs)       Communication   Communication: No difficulties  Cognition Arousal/Alertness: Awake/alert Behavior During Therapy: WFL for tasks assessed/performed Overall Cognitive Status: Within Functional Limits for tasks assessed                                          General Comments      Exercises Other Exercises Other Exercises: Toilet transfer, ambulatory with 4WRW, cga/close sup; sit/satnd from standard toilet seat height, cga/min assist (due to lower surface height); standing balance for hygiene, clothing management, cga/close sup.   Assessment/Plan    PT Assessment Patient needs continued PT services  PT Problem List Decreased activity tolerance;Decreased balance;Decreased mobility;Decreased knowledge of use of DME;Decreased knowledge of precautions;Cardiopulmonary status limiting activity       PT Treatment Interventions DME instruction;Gait training;Stair training;Functional mobility training;Therapeutic activities;Therapeutic exercise;Balance training;Patient/family education    PT Goals (Current goals can be found in the Care Plan section)  Acute Rehab PT Goals Patient Stated Goal: to keep my strength up PT Goal Formulation: With patient Time For Goal Achievement: 11/04/21 Potential to Achieve Goals: Good    Frequency Min  2X/week   Barriers to discharge        Co-evaluation               AM-PAC PT "6 Clicks" Mobility  Outcome Measure Help needed turning from your back to your side while in a flat bed without using bedrails?: None Help needed moving from lying on your back to sitting on the side of a flat bed without using bedrails?: None Help needed moving to and from a bed to a chair (including a wheelchair)?: A Little Help needed standing up from a chair using your arms (e.g., wheelchair or bedside chair)?: A Little Help needed to walk in hospital room?: A Little Help needed climbing 3-5 steps with a railing? : A Little 6 Click Score: 20    End of Session Equipment Utilized During Treatment: Gait belt;Oxygen Activity Tolerance: Patient tolerated treatment well Patient left: in bed;with call bell/phone within reach Nurse Communication: Mobility status PT Visit Diagnosis: Muscle weakness (generalized) (M62.81);Difficulty in walking, not elsewhere classified (R26.2)    Time: 8889-1694 PT Time Calculation (min) (ACUTE ONLY): 38 min   Charges:   PT Evaluation $PT Eval Moderate Complexity: 1 Mod PT Treatments $Therapeutic Activity: 23-37 mins      Zhoe Catania H. Owens Shark, PT, DPT, NCS 10/21/21, 3:17 PM [phone]

## 2021-10-21 NOTE — Progress Notes (Signed)
Pharmacy Antibiotic Note  Samantha King is a 75 y.o. female w/ PMH of anxiety, depression, CAD, COPD, HTN admitted on 10/20/2021 with pneumonia.  Pharmacy has been consulted for levofloxacin dosing.  Plan: start levofloxacin 750 mg IV every 24 hours Monitor renal function for needed dose adjustments   Height: 5\' 2"  (157.5 cm) Weight: 79.8 kg (176 lb) IBW/kg (Calculated) : 50.1  Temp (24hrs), Avg:97.7 F (36.5 C), Min:97.6 F (36.4 C), Max:97.8 F (36.6 C)  Recent Labs  Lab 10/20/21 0808 10/20/21 1025 10/20/21 1217 10/20/21 2020 10/21/21 0414  WBC 24.7*  --   --  22.7* 19.2*  CREATININE 0.78  --   --  0.69 0.74  LATICACIDVEN  --  1.3 1.1  --   --     Estimated Creatinine Clearance: 57.6 mL/min (by C-G formula based on SCr of 0.74 mg/dL).    Allergies  Allergen Reactions   Shellfish Allergy Nausea And Vomiting   Codeine Other (See Comments)    Makes me hyperactive and not able to sleep   Lidocaine Palpitations    Antimicrobials this admission: 12/11 azithromycin x 1 12/11 vancomycin x 1 12/11 ceftriaxone x 1  12/12 levofloxacin >>   Microbiology results: 12/11 BCx: NGTD 12/12 resp panel: negative 12/11 SARS CoV-2: negative 12/11 influenza A/B: negative 12/12 MRSA PCR: negative  Thank you for allowing pharmacy to be a part of this patient's care.  Samantha King 10/21/2021 9:34 AM

## 2021-10-22 ENCOUNTER — Ambulatory Visit: Payer: Medicare Other

## 2021-10-22 LAB — BASIC METABOLIC PANEL
Anion gap: 9 (ref 5–15)
BUN: 25 mg/dL — ABNORMAL HIGH (ref 8–23)
CO2: 25 mmol/L (ref 22–32)
Calcium: 9.3 mg/dL (ref 8.9–10.3)
Chloride: 98 mmol/L (ref 98–111)
Creatinine, Ser: 0.99 mg/dL (ref 0.44–1.00)
GFR, Estimated: 59 mL/min — ABNORMAL LOW (ref >60)
Glucose, Bld: 101 mg/dL — ABNORMAL HIGH (ref 70–99)
Potassium: 3.8 mmol/L (ref 3.5–5.1)
Sodium: 132 mmol/L — ABNORMAL LOW (ref 135–145)

## 2021-10-22 LAB — MAGNESIUM: Magnesium: 2.1 mg/dL (ref 1.7–2.4)

## 2021-10-22 LAB — LEGIONELLA PNEUMOPHILA SEROGP 1 UR AG: L. pneumophila Serogp 1 Ur Ag: NEGATIVE

## 2021-10-22 MED ORDER — LEVOFLOXACIN 750 MG PO TABS: 750.0000 mg | ORAL_TABLET | Freq: Every day | ORAL | 0 refills | Status: DC

## 2021-10-22 MED ORDER — PREDNISONE 20 MG PO TABS
40.0000 mg | ORAL_TABLET | Freq: Every day | ORAL | 0 refills | Status: AC
Start: 2021-10-23 — End: ?

## 2021-10-22 MED ORDER — LEVOFLOXACIN 750 MG PO TABS
750.0000 mg | ORAL_TABLET | Freq: Every day | ORAL | Status: DC
  Administered 2021-10-22: 750 mg via ORAL
  Filled 2021-10-22: qty 1

## 2021-10-22 NOTE — Progress Notes (Signed)
Patient was 94% on room air sitting in her recliner. Patient was assisted to walk while testing oxygen saturation. Patient was able to maintain a 90-92% saturation while ambulating with out oxygen. Dr. Si Raider notified of patients ambulating oxygen levels. Patient does not qualify for home oxygen.

## 2021-10-22 NOTE — Care Management (Signed)
Faxed outpatient PT orders to [facility] outpatient PT at [phone]

## 2021-10-22 NOTE — Discharge Summary (Signed)
Samantha King QAS:[id number] DOB: 05/06/1946 DOA: 10/20/2021  PCP: Haywood Pao, MD  Admit date: 10/20/2021 Discharge date: 10/22/2021  Time spent: 40 minutes  Recommendations for Outpatient Follow-up:  Pcp f/u 1-2 weeks. Consider repeat CXR to eval small effusion seen in hospital  Cardiology f/u    Discharge Diagnoses:  Principal Problem:   Healthcare-associated pneumonia Active Problems:   Hypertension   GERD (gastroesophageal reflux disease)   Acute respiratory failure (HCC)   Hypokalemia   Hyperlipidemia   Coronary atherosclerosis of native coronary artery   Gastritis and gastroduodenitis   Back pain   COPD with acute exacerbation (HCC)   Acute hypoxemic respiratory failure (HCC)   Paroxysmal atrial fibrillation ([place])   HCAP (healthcare-associated pneumonia)   Discharge Condition: stable   Diet recommendation: heart healthy  Filed Weights   10/20/21 0732 10/20/21 0800  Weight: 80.7 kg 79.8 kg    History of present illness:  Samantha King is a 75 y.o. female with medical history significant for coronary artery disease, MI status post cardiac arrest hypertension, hyperlipidemia, and COPD not on home O2 who presented with nonproductive cough nasal congestion and body aches generalized weakness and subjective fever.  She stated her fever was as high as 103.  She denies significant shortness of breath or chest pain or leg swelling but she has chronic edema for which she takes Lasix.  Denies any abdominal pain or diarrhea.  She had some vomiting this morning.  Patient stated she had a history of MI in December 2013 and she had a prolonged hospital stay then.   Hospital Course:  # Community acquired pneumonia One week uri symptoms. CT with right middle lobe infiltrate and small right sided effusion. fever but does have leukocytosis, elevated WBC, elevated procal. Covid/flu neg. New O2 requirement resolved on hospital day 1. Did finish prolonged course of  IV abx for bacteremia in September. Symptomatically much improved - finish course levaquin - pcp f/u 1-2 weeks, consider repeat cxr to eval for resolution of small parapneumonic effusion   # Acute hypoxic respiratory failure Resolved   # COPD with exacerbation - levaquin as above - add prednisone daily   # Elevated troponin Elevated to 278 on arrival, then down-trended. Was in the 40s several months ago. No chest pain, denies shortness of breath. No ischemic changes on ekg. Suspect demand from CAP - close cardiology f/u, may need repeat ischemic eval   # CAD # History NSTEMI # HFpEF # Mild/moderate aortic stenosis History STEMI in 2013 treated with one DES. Recent TTE with diastolic dysfunction, normal EF, moderate AS. Appears compensated - cont home metoprolol, lasix, plavix, statin  Procedures: none   Consultations: none  Discharge Exam: Vitals:   10/22/21 0844 10/22/21 1055  BP:    Pulse:    Resp:    Temp:    SpO2: 97% 92%    General exam: Appears calm and comfortable  Respiratory system: few scattered exp wheeze and rhonchi. Pursed lip breathing Cardiovascular system: S1 & S2 heard, RRR. Soft systolic murmur, distant heart sounds Gastrointestinal system: Abdomen is nondistended, soft and nontender. No organomegaly or masses felt. Normal bowel sounds heard. Central nervous system: Alert and oriented. No focal neurological deficits. Extremities: Symmetric 5 x 5 power. No edema Skin: No rashes, lesions or ulcers Psychiatry: Judgement and insight appear normal. Mood & affect appropriate.   Discharge Instructions   Discharge Instructions     Diet - low sodium heart healthy   Complete by: As directed  Increase activity slowly   Complete by: As directed       Allergies as of 10/22/2021       Reactions   Shellfish Allergy Nausea And Vomiting   Codeine Other (See Comments)   Makes me hyperactive and not able to sleep   Lidocaine Palpitations         Medication List     STOP taking these medications    escitalopram 5 MG tablet Commonly known as: LEXAPRO   metoprolol succinate 25 MG 24 hr tablet Commonly known as: TOPROL-XL       TAKE these medications    acetaminophen 325 MG tablet Commonly known as: TYLENOL Take 650 mg by mouth every 6 (six) hours as needed for mild pain or headache. For headache or pain   atorvastatin 80 MG tablet Commonly known as: LIPITOR Take 1 tablet (80 mg total) by mouth daily at 6 PM.   budesonide-formoterol 160-4.5 MCG/ACT inhaler Commonly known as: Symbicort Inhale 2 puffs into the lungs 2 (two) times daily.   busPIRone 5 MG tablet Commonly known as: BUSPAR Take 1 tablet (5 mg total) by mouth 3 (three) times daily.   clopidogrel 75 MG tablet Commonly known as: PLAVIX Take 1 tablet (75 mg total) by mouth daily.   colchicine 0.6 MG tablet Take 1 tablet (0.6 mg total) by mouth daily as needed (for gout flare).   diclofenac Sodium 1 % Gel Commonly known as: VOLTAREN See admin instructions.   DULoxetine 60 MG capsule Commonly known as: CYMBALTA Take 1 capsule (60 mg total) by mouth daily.   fluticasone 50 MCG/ACT nasal spray Commonly known as: FLONASE Place 1 spray into both nostrils daily.   furosemide 40 MG tablet Commonly known as: LASIX Take 1 tablet (40 mg total) by mouth daily.   gabapentin 600 MG tablet Commonly known as: Neurontin Take 1 tablet (600 mg total) by mouth 2 (two) times daily. X  1week, then IF need be, increase to 600 mg 3x/day- (1 in Am/2 at night)   levofloxacin 750 MG tablet Commonly known as: LEVAQUIN Take 1 tablet (750 mg total) by mouth daily.   lidocaine 5 % Commonly known as: LIDODERM Place 1 patch onto the skin daily. Apply to lower back at 7 am and remove at 7 pm daily.   loratadine 10 MG tablet Commonly known as: CLARITIN Take 1 tablet (10 mg total) by mouth daily.   Metamucil [employer] Fiber 58.12 % Pack Generic drug: Psyllium Mix  1 packet in liquid and drink daily.   methocarbamol 750 MG tablet Commonly known as: ROBAXIN Take 1 tablet (750 mg total) by mouth every 6 (six) hours as needed for muscle spasms.   metoprolol tartrate 25 MG tablet Commonly known as: LOPRESSOR Take 1 tablet (25 mg total) by mouth 2 (two) times daily.   pantoprazole 40 MG tablet Commonly known as: PROTONIX Take 1 tablet (40 mg total) by mouth daily.   potassium chloride SA 20 MEQ tablet Commonly known as: KLOR-CON M Take 1 tablet (20 mEq total) by mouth 2 (two) times daily.   predniSONE 20 MG tablet Commonly known as: DELTASONE Take 2 tablets (40 mg total) by mouth daily with breakfast. Start taking on: October 23, 2021   Ventolin HFA 108 (90 Base) MCG/ACT inhaler Generic drug: albuterol SMARTSIG:2 Puff(s) By Mouth Every 4-6 Hours PRN   vitamin C 500 MG tablet Commonly known as: ASCORBIC ACID Take 1 tablet (500 mg total) by mouth 2 (two) times daily.  Zinc Sulfate 220 (50 Zn) MG Tabs Take 1 tablet (220 mg total) by mouth daily.       Allergies  Allergen Reactions   Shellfish Allergy Nausea And Vomiting   Codeine Other (See Comments)    Makes me hyperactive and not able to sleep   Lidocaine Palpitations    Follow-up Information     Tisovec, Fransico Him, MD Follow up.   Specialty: Internal Medicine Why: 1-2 weeks Contact information: [address] [place] [address] [phone]         Burnell Blanks, MD .   Specialty: Cardiology Contact information: [place] [place] [address] [phone]                  The results of significant diagnostics from this hospitalization (including imaging, microbiology, ancillary and laboratory) are listed below for reference.    Significant Diagnostic Studies: DG Chest 2 View  Result Date: 10/20/2021 CLINICAL DATA:  Shortness of breath. EXAM: CHEST - 2 VIEW COMPARISON:  July 16, 2021. FINDINGS: Mild cardiomegaly  is noted. Bibasilar pneumonia or edema is noted with small right pleural effusion. Bony thorax is unremarkable. IMPRESSION: Bibasilar edema or infiltrates are noted with small right pleural effusion. Electronically Signed   By: Marijo Conception M.D.   On: 10/20/2021 08:00   CT CHEST WO CONTRAST  Result Date: 10/21/2021 CLINICAL DATA:  Suspect pneumonia in a 75 year old female. EXAM: CT CHEST WITHOUT CONTRAST TECHNIQUE: Multidetector CT imaging of the chest was performed following the standard protocol without IV contrast. COMPARISON:  Chest x-ray from October 20, 2021. FINDINGS: Cardiovascular: Calcified atheromatous plaque of the thoracic aorta. Calcified coronary artery disease. Moderate cardiac enlargement without substantial pericardial effusion. Normal caliber of central pulmonary vessels. Limited assessment of cardiovascular structures given lack of intravenous contrast. Mediastinum/Nodes: Thoracic inlet structures are unremarkable. No axillary lymphadenopathy. Lungs/Pleura: Dense RIGHT middle lobe consolidation. Lower lobe consolidation as well. Areas of septal thickening in the middle lobe and upper lobe in the RIGHT chest. Associated RIGHT-sided, para in a Modic effusion is small. No pneumothorax. No LEFT-sided consolidation. Airways are patent. Upper Abdomen: No acute upper abdominal process to the extent evaluated. Musculoskeletal: No acute bone finding. No destructive bone process. Spinal degenerative changes. IMPRESSION: RIGHT middle lobe pneumonia with pneumonitis also in the upper and lower lobes. Small RIGHT-sided RIGHT-sided parapneumonic effusion. Suggest follow-up to ensure resolution of above findings. Background pulmonary emphysema as before. Aortic atherosclerosis Small RIGHT pleural effusion. Cardiomegaly and coronary artery disease. Aortic Atherosclerosis (ICD10-I70.0) and Emphysema (ICD10-J43.9). Electronically Signed   By: Zetta Bills M.D.   On: 10/21/2021 18:16     Microbiology: Recent Results (from the past 240 hour(s))  Resp Panel by RT-PCR (Flu A&B, Covid) Nasopharyngeal Swab     Status: None   Collection Time: 10/20/21  8:08 AM   Specimen: Nasopharyngeal Swab; Nasopharyngeal(NP) swabs in vial transport medium  Result Value Ref Range Status   SARS Coronavirus 2 by RT PCR NEGATIVE NEGATIVE Final    Comment: (NOTE) SARS-CoV-2 target nucleic acids are NOT DETECTED.  The SARS-CoV-2 RNA is generally detectable in upper respiratory specimens during the acute phase of infection. The lowest concentration of SARS-CoV-2 viral copies this assay can detect is 138 copies/mL. A negative result does not preclude SARS-Cov-2 infection and should not be used as the sole basis for treatment or other patient management decisions. A negative result may occur with  improper specimen collection/handling, submission of specimen other than nasopharyngeal swab, presence  of viral mutation(s) within the areas targeted by this assay, and inadequate number of viral copies(<138 copies/mL). A negative result must be combined with clinical observations, patient history, and epidemiological information. The expected result is Negative.  Fact Sheet for Patients:  EntrepreneurPulse.com.au  Fact Sheet for Healthcare Providers:  IncredibleEmployment.be  This test is no t yet approved or cleared by the [place] FDA and  has been authorized for detection and/or diagnosis of SARS-CoV-2 by FDA under an Emergency Use Authorization (EUA). This EUA will remain  in effect (meaning this test can be used) for the duration of the COVID-19 declaration under Section 564(b)(1) of the Act, 21 U.S.C.section 360bbb-3(b)(1), unless the authorization is terminated  or revoked sooner.       Influenza A by PCR NEGATIVE NEGATIVE Final   Influenza B by PCR NEGATIVE NEGATIVE Final    Comment: (NOTE) The Xpert Xpress SARS-CoV-2/FLU/RSV plus assay is  intended as an aid in the diagnosis of influenza from Nasopharyngeal swab specimens and should not be used as a sole basis for treatment. Nasal washings and aspirates are unacceptable for Xpert Xpress SARS-CoV-2/FLU/RSV testing.  Fact Sheet for Patients: EntrepreneurPulse.com.au  Fact Sheet for Healthcare Providers: IncredibleEmployment.be  This test is not yet approved or cleared by the [place] FDA and has been authorized for detection and/or diagnosis of SARS-CoV-2 by FDA under an Emergency Use Authorization (EUA). This EUA will remain in effect (meaning this test can be used) for the duration of the COVID-19 declaration under Section 564(b)(1) of the Act, 21 U.S.C. section 360bbb-3(b)(1), unless the authorization is terminated or revoked.  Performed at [facility], [address] [place], [place] [address]   Culture, blood (routine x 2)     Status: None (Preliminary result)   Collection Time: 10/20/21 10:17 AM   Specimen: BLOOD  Result Value Ref Range Status   Specimen Description BLOOD RIGHT ANTECUBITAL  Final   Special Requests   Final    BOTTLES DRAWN AEROBIC AND ANAEROBIC Blood Culture adequate volume   Culture   Final    NO GROWTH 2 DAYS Performed at [facility], [address] [place], [place] [address]    Report Status PENDING  Incomplete  Culture, blood (routine x 2)     Status: None (Preliminary result)   Collection Time: 10/20/21 10:25 AM   Specimen: BLOOD  Result Value Ref Range Status   Specimen Description BLOOD BLOOD RIGHT FOREARM  Final   Special Requests   Final    BOTTLES DRAWN AEROBIC AND ANAEROBIC Blood Culture adequate volume   Culture   Final    NO GROWTH 2 DAYS Performed at [facility], [address] [place], [place] [address]    Report Status PENDING  Incomplete  Respiratory (~20 pathogens) panel by PCR     Status: None   Collection Time: 10/21/21  8:49 AM    Specimen: Nasopharyngeal Swab; Respiratory  Result Value Ref Range Status   Adenovirus NOT DETECTED NOT DETECTED Final   Coronavirus 229E NOT DETECTED NOT DETECTED Final    Comment: (NOTE) The Coronavirus on the Respiratory Panel, DOES NOT test for the novel  Coronavirus (2019 nCoV)    Coronavirus HKU1 NOT DETECTED NOT DETECTED Final   Coronavirus NL63 NOT DETECTED NOT DETECTED Final   Coronavirus OC43 NOT DETECTED NOT DETECTED Final   Metapneumovirus NOT DETECTED NOT DETECTED Final   Rhinovirus / Enterovirus NOT DETECTED NOT DETECTED Final   Influenza A NOT DETECTED NOT DETECTED Final  Influenza B NOT DETECTED NOT DETECTED Final   Parainfluenza Virus 1 NOT DETECTED NOT DETECTED Final   Parainfluenza Virus 2 NOT DETECTED NOT DETECTED Final   Parainfluenza Virus 3 NOT DETECTED NOT DETECTED Final   Parainfluenza Virus 4 NOT DETECTED NOT DETECTED Final   Respiratory Syncytial Virus NOT DETECTED NOT DETECTED Final   Bordetella pertussis NOT DETECTED NOT DETECTED Final   Bordetella Parapertussis NOT DETECTED NOT DETECTED Final   Chlamydophila pneumoniae NOT DETECTED NOT DETECTED Final   Mycoplasma pneumoniae NOT DETECTED NOT DETECTED Final    Comment: Performed at [facility], [place] [address] [place], [place] [address]  MRSA Next Gen by PCR, Nasal     Status: None   Collection Time: 10/21/21  9:19 AM   Specimen: Nasal Mucosa; Nasal Swab  Result Value Ref Range Status   MRSA by PCR Next Gen NOT DETECTED NOT DETECTED Final    Comment: (NOTE) The GeneXpert MRSA Assay (FDA approved for NASAL specimens only), is one component of a comprehensive MRSA colonization surveillance program. It is not intended to diagnose MRSA infection nor to guide or monitor treatment for MRSA infections. Test performance is not FDA approved in patients less than 21 years old. Performed at [facility], [address] [place], [place] [address]      Labs: Basic Metabolic Panel: Recent  Labs  Lab 10/20/21 [phone] 10/20/21 2020 10/21/21 0414 10/22/21 0250  NA 134*  --  132* 132*  K 3.0*  --  3.4* 3.8  CL 101  --  97* 98  CO2 22  --  24 25  GLUCOSE 145*  --  115* 101*  BUN 16  --  14 25*  CREATININE 0.78 0.69 0.74 0.99  CALCIUM 9.4  --  9.3 9.3  MG  --   --   --  2.1   Liver Function Tests: No results for input(s): AST, ALT, ALKPHOS, BILITOT, PROT, ALBUMIN in the last 168 hours. No results for input(s): LIPASE, AMYLASE in the last 168 hours. No results for input(s): AMMONIA in the last 168 hours. CBC: Recent Labs  Lab 10/20/21 0808 10/20/21 2020 10/21/21 0414  WBC 24.7* 22.7* 19.2*  HGB 12.2 11.3* 11.3*  HCT 37.5 35.0* 35.4*  MCV 89.3 89.7 89.2  PLT 435* 395 426*   Cardiac Enzymes: No results for input(s): CKTOTAL, CKMB, CKMBINDEX, TROPONINI in the last 168 hours. BNP: BNP (last 3 results) Recent Labs    10/20/21 0808 10/21/21 0414  BNP 1,239.2* 829.4*    ProBNP (last 3 results) No results for input(s): PROBNP in the last 8760 hours.  CBG: No results for input(s): GLUCAP in the last 168 hours.     Signed:  Desma Maxim MD.  [facility] 10/22/2021, 12:31 PM

## 2021-10-22 NOTE — Progress Notes (Addendum)
[facility] REFERRAL  Occupational Therapy * Physical Therapy * Speech Therapy  DATE  10/22/21  PATIENT NAME Samantha King PATIENT MRN [id number]  DIAGNOSIS/DIAGNOSIS CODE HCAP  DATE OF DISCHARGE: October 17, 2044  PRIMARY CARE PHYSICIAN Tisovec   Dear Provider (Name: _______________________________ Fax: ___________________________):  I certify that I have examined this patient and that occupational/physical/speech therapy is necessary on an outpatient basis.  The patient has expressed interest in completing their recommended course of therapy at your  location. Once a formal order from the patients primary care physician has been obtained, please contact him/her to schedule an appointment for evaluation at your earliest convenience.  [ X ] Physical Therapy Evaluate and Treat  [ ]  Occupational Therapy Evaluate and Treat  [ ]  Speech Therapy Evaluate and Treat  The patients primary care physician (listed above) must furnish and be responsible for a formal order such that the recommended services may be furnished while under the primary physicians care, and that the plan of care will be established and reviewed every 30 days (or more often if condition necessitates).

## 2021-10-23 ENCOUNTER — Ambulatory Visit: Payer: Medicare Other | Admitting: Physical Therapy

## 2021-10-24 ENCOUNTER — Ambulatory Visit: Payer: Medicare Other | Admitting: Physical Therapy

## 2021-10-28 ENCOUNTER — Ambulatory Visit: Payer: Medicare Other | Admitting: Physical Therapy

## 2021-10-30 ENCOUNTER — Ambulatory Visit: Payer: Medicare Other

## 2021-10-30 LAB — CULTURE, BLOOD (ROUTINE X 2)
Culture: NO GROWTH
Culture: NO GROWTH
Special Requests: ADEQUATE
Special Requests: ADEQUATE

## 2021-11-06 ENCOUNTER — Ambulatory Visit: Payer: Medicare Other | Admitting: Physical Therapy

## 2021-11-06 DIAGNOSIS — H2512 Age-related nuclear cataract, left eye: Secondary | ICD-10-CM | POA: Diagnosis not present

## 2021-11-07 ENCOUNTER — Encounter: Payer: Self-pay | Admitting: Ophthalmology

## 2021-11-12 ENCOUNTER — Ambulatory Visit: Payer: Medicare Other | Attending: Physical Medicine and Rehabilitation | Admitting: Physical Therapy

## 2021-11-12 DIAGNOSIS — Z1331 Encounter for screening for depression: Secondary | ICD-10-CM | POA: Diagnosis not present

## 2021-11-12 DIAGNOSIS — J45909 Unspecified asthma, uncomplicated: Secondary | ICD-10-CM | POA: Diagnosis not present

## 2021-11-12 DIAGNOSIS — M810 Age-related osteoporosis without current pathological fracture: Secondary | ICD-10-CM | POA: Diagnosis not present

## 2021-11-12 DIAGNOSIS — K219 Gastro-esophageal reflux disease without esophagitis: Secondary | ICD-10-CM | POA: Diagnosis not present

## 2021-11-12 DIAGNOSIS — I119 Hypertensive heart disease without heart failure: Secondary | ICD-10-CM | POA: Diagnosis not present

## 2021-11-12 DIAGNOSIS — J189 Pneumonia, unspecified organism: Secondary | ICD-10-CM | POA: Diagnosis not present

## 2021-11-12 DIAGNOSIS — I2510 Atherosclerotic heart disease of native coronary artery without angina pectoris: Secondary | ICD-10-CM | POA: Diagnosis not present

## 2021-11-12 DIAGNOSIS — Z1339 Encounter for screening examination for other mental health and behavioral disorders: Secondary | ICD-10-CM | POA: Diagnosis not present

## 2021-11-12 DIAGNOSIS — E669 Obesity, unspecified: Secondary | ICD-10-CM | POA: Diagnosis not present

## 2021-11-12 DIAGNOSIS — E7800 Pure hypercholesterolemia, unspecified: Secondary | ICD-10-CM | POA: Diagnosis not present

## 2021-11-12 DIAGNOSIS — F3341 Major depressive disorder, recurrent, in partial remission: Secondary | ICD-10-CM | POA: Diagnosis not present

## 2021-11-14 ENCOUNTER — Ambulatory Visit: Payer: Medicare Other | Admitting: Physical Therapy

## 2021-11-18 ENCOUNTER — Ambulatory Visit: Payer: Medicare Other | Admitting: Physical Therapy

## 2021-11-18 NOTE — Discharge Instructions (Signed)

## 2021-11-20 ENCOUNTER — Other Ambulatory Visit: Payer: Self-pay

## 2021-11-20 ENCOUNTER — Ambulatory Visit: Payer: Medicare Other | Admitting: Anesthesiology

## 2021-11-20 ENCOUNTER — Ambulatory Visit: Payer: Medicare Other | Admitting: Physical Therapy

## 2021-11-20 ENCOUNTER — Encounter: Payer: Self-pay | Admitting: Ophthalmology

## 2021-11-20 ENCOUNTER — Encounter
Admission: RE | Disposition: A | Discharge status: Home / Self Care | Payer: Self-pay | Source: Home / Self Care | Attending: Ophthalmology

## 2021-11-20 ENCOUNTER — Ambulatory Visit
Admission: RE | Admit: 2021-11-20 | Discharge: 2021-11-20 | Disposition: A | Discharge status: Home / Self Care | Payer: Medicare Other | Attending: Ophthalmology | Admitting: Ophthalmology

## 2021-11-20 DIAGNOSIS — H2512 Age-related nuclear cataract, left eye: Secondary | ICD-10-CM | POA: Diagnosis not present

## 2021-11-20 DIAGNOSIS — Z87891 Personal history of nicotine dependence: Secondary | ICD-10-CM | POA: Diagnosis not present

## 2021-11-20 DIAGNOSIS — H25812 Combined forms of age-related cataract, left eye: Secondary | ICD-10-CM | POA: Diagnosis not present

## 2021-11-20 HISTORY — PX: CATARACT EXTRACTION W/PHACO: SHX586

## 2021-11-20 SURGERY — PHACOEMULSIFICATION, CATARACT, WITH IOL INSERTION
Anesthesia: Monitor Anesthesia Care | Site: Eye | Laterality: Left

## 2021-11-20 MED ORDER — TETRACAINE HCL 0.5 % OP SOLN
1.0000 [drp] | OPHTHALMIC | Status: DC | PRN
  Administered 2021-11-20 (×3): 1 [drp] via OPHTHALMIC

## 2021-11-20 MED ORDER — SIGHTPATH DOSE#1 NA HYALUR & NA CHOND-NA HYALUR IO KIT
PACK | INTRAOCULAR | Status: DC | PRN
  Administered 2021-11-20: 1 via OPHTHALMIC

## 2021-11-20 MED ORDER — SIGHTPATH DOSE#1 BSS IO SOLN
INTRAOCULAR | Status: DC | PRN
  Administered 2021-11-20: 15 mL

## 2021-11-20 MED ORDER — MIDAZOLAM HCL 2 MG/2ML IJ SOLN
INTRAMUSCULAR | Status: DC | PRN
  Administered 2021-11-20: 2 mg via INTRAVENOUS

## 2021-11-20 MED ORDER — FENTANYL CITRATE (PF) 100 MCG/2ML IJ SOLN
INTRAMUSCULAR | Status: DC | PRN
  Administered 2021-11-20: 50 ug via INTRAVENOUS

## 2021-11-20 MED ORDER — SIGHTPATH DOSE#1 BSS IO SOLN
INTRAOCULAR | Status: DC | PRN
  Administered 2021-11-20: 53 mL via OPHTHALMIC

## 2021-11-20 MED ORDER — LACTATED RINGERS IV SOLN: INTRAVENOUS | Status: DC

## 2021-11-20 MED ORDER — CEFUROXIME OPHTHALMIC INJECTION 1 MG/0.1 ML
INJECTION | OPHTHALMIC | Status: DC | PRN
  Administered 2021-11-20: 0.1 mL via INTRACAMERAL

## 2021-11-20 MED ORDER — BRIMONIDINE TARTRATE-TIMOLOL 0.2-0.5 % OP SOLN
OPHTHALMIC | Status: DC | PRN
  Administered 2021-11-20: 1 [drp] via OPHTHALMIC

## 2021-11-20 MED ORDER — ARMC OPHTHALMIC DILATING DROPS
1.0000 "application " | OPHTHALMIC | Status: DC | PRN
  Administered 2021-11-20 (×3): 1 via OPHTHALMIC

## 2021-11-20 MED ORDER — SIGHTPATH DOSE#1 BSS IO SOLN
INTRAOCULAR | Status: DC | PRN
  Administered 2021-11-20: 1 mL via INTRAMUSCULAR

## 2021-11-20 SURGICAL SUPPLY — 11 items
CATARACT SUITE SIGHTPATH (MISCELLANEOUS) ×2 IMPLANT
FEE CATARACT SUITE SIGHTPATH (MISCELLANEOUS) ×1 IMPLANT
GLOVE SRG 8 PF TXTR STRL LF DI (GLOVE) ×1 IMPLANT
GLOVE SURG ENC TEXT LTX SZ7.5 (GLOVE) ×2 IMPLANT
GLOVE SURG UNDER POLY LF SZ8 (GLOVE) ×2
LENS IOL TECNIS EYHANCE 22.0 (Intraocular Lens) ×1 IMPLANT
NDL FILTER BLUNT 18X1 1/2 (NEEDLE) ×1 IMPLANT
NEEDLE FILTER BLUNT 18X 1/2SAF (NEEDLE) ×1
NEEDLE FILTER BLUNT 18X1 1/2 (NEEDLE) ×1 IMPLANT
SYR 3ML LL SCALE MARK (SYRINGE) ×2 IMPLANT
WATER STERILE IRR 250ML POUR (IV SOLUTION) ×2 IMPLANT

## 2021-11-20 NOTE — Op Note (Signed)
OPERATIVE NOTE  Samantha King [id number] 11/20/2021   PREOPERATIVE DIAGNOSIS:  Nuclear sclerotic cataract left eye. H25.12   POSTOPERATIVE DIAGNOSIS:    Nuclear sclerotic cataract left eye.     PROCEDURE:  Phacoemusification with posterior chamber intraocular lens placement of the left eye  Ultrasound time: Procedure(s): CATARACT EXTRACTION PHACO AND INTRAOCULAR LENS PLACEMENT (IOC) LEFT 3.91 00:48.3 (Left)  LENS:   Implant Name Type Inv. Item Serial No. Manufacturer Lot No. LRB No. Used Action  LENS IOL TECNIS EYHANCE 22.0 - [id number] Intraocular Lens LENS IOL TECNIS EYHANCE 22.0 [id number] [employer]  Left 1 Implanted      SURGEON:  Wyonia Hough, MD   ANESTHESIA:  Topical with tetracaine drops and 2% Xylocaine jelly, augmented with 1% preservative-free intracameral lidocaine.    COMPLICATIONS:  None.   DESCRIPTION OF PROCEDURE:  The patient was identified in the holding room and transported to the operating room and placed in the supine position under the operating microscope.  The left eye was identified as the operative eye and it was prepped and draped in the usual sterile ophthalmic fashion.   A 1 millimeter clear-corneal paracentesis was made at the 1:30 position.  0.5 ml of preservative-free 1% lidocaine was injected into the anterior chamber.  The anterior chamber was filled with Viscoat viscoelastic.  A 2.4 millimeter keratome was used to make a near-clear corneal incision at the 10:30 position.  .  A curvilinear capsulorrhexis was made with a cystotome and capsulorrhexis forceps.  Balanced salt solution was used to hydrodissect and hydrodelineate the nucleus.   Phacoemulsification was then used in stop and chop fashion to remove the lens nucleus and epinucleus.  The remaining cortex was then removed using the irrigation and aspiration handpiece. Provisc was then placed into the capsular bag to distend it for lens placement.  A lens was then injected into the  capsular bag.  The remaining viscoelastic was aspirated.   Wounds were hydrated with balanced salt solution.  The anterior chamber was inflated to a physiologic pressure with balanced salt solution.  No wound leaks were noted. Cefuroxime 0.1 ml of a 10mg /ml solution was injected into the anterior chamber for a dose of 1 mg of intracameral antibiotic at the completion of the case.   Timolol and Brimonidine drops were applied to the eye.  The patient was taken to the recovery room in stable condition without complications of anesthesia or surgery.  Samantha King 11/20/2021, 10:31 AM

## 2021-11-20 NOTE — Anesthesia Preprocedure Evaluation (Signed)
Anesthesia Evaluation  Patient identified by MRN, date of birth, ID band Patient awake    Reviewed: Allergy & Precautions, H&P , NPO status , Patient's Chart, lab work & pertinent test results  History of Anesthesia Complications (+) PONV and history of anesthetic complications  Airway Mallampati: II  TM Distance: >3 FB Neck ROM: full    Dental no notable dental hx.    Pulmonary COPD, former smoker,    Pulmonary exam normal        Cardiovascular hypertension, On Medications + CAD and + Past MI  Normal cardiovascular exam Rhythm:regular Rate:Normal     Neuro/Psych Anxiety negative neurological ROS     GI/Hepatic Neg liver ROS, PUD, Medicated,  Endo/Other  negative endocrine ROS  Renal/GU   negative genitourinary   Musculoskeletal   Abdominal   Peds  Hematology   Anesthesia Other Findings   Reproductive/Obstetrics                             Anesthesia Physical Anesthesia Plan  ASA: 3  Anesthesia Plan: MAC   Post-op Pain Management:    Induction:   PONV Risk Score and Plan: 3 and TIVA, Midazolam and Treatment may vary due to age or medical condition  Airway Management Planned:   Additional Equipment:   Intra-op Plan:   Post-operative Plan:   Informed Consent: I have reviewed the patients History and Physical, chart, labs and discussed the procedure including the risks, benefits and alternatives for the proposed anesthesia with the patient or authorized representative who has indicated his/her understanding and acceptance.       Plan Discussed with:   Anesthesia Plan Comments:         Anesthesia Quick Evaluation

## 2021-11-20 NOTE — H&P (Signed)
[facility]   Primary Care Physician:  Tisovec, Fransico Him, MD Ophthalmologist: Dr. Leandrew Koyanagi  Pre-Procedure History & Physical: HPI:  Samantha King is a 76 y.o. female here for ophthalmic surgery.   Past Medical History:  Diagnosis Date   CAD (coronary artery disease)    a. s/p INF-LAT STEMI => s/p DES-RCA c/b VF arrest and cardiogenic shock   Cardiac arrest ([place]) 10/2012   STEMI with VF arrest x 2    Complication of anesthesia    ICU delerium after 7 day "induced coma" after MI in 2013   GERD (gastroesophageal reflux disease)    H1N1 influenza 10/2012   Hx of echocardiogram    a. Echo 10/30/12: Moderate LVH, EF 55-60%, normal wall motion, PASP 45   Hyperlipidemia    Hypertension    Myocardial infarction ([place])    Pneumonia 10/2012   a. STEMI c/b LLL pneumonia in setting of recent H1N1 influenza   Pneumonia 10/20/2021   Completed meds.  Symptom free 11/03/21   PONV (postoperative nausea and vomiting)    after BTL   PUD (peptic ulcer disease)    Syncope 1996   reported episode while driving in [place] in 1996 with full cardiac workup = negative   Tobacco abuse     Past Surgical History:  Procedure Laterality Date   BREAST EXCISIONAL BIOPSY     left breast ? 1981   CORONARY ANGIOPLASTY WITH STENT PLACEMENT     s/p inferior STEMI c/b VF arrest and cardiogenic shock requiring IABP   ESOPHAGOGASTRODUODENOSCOPY (EGD) WITH PROPOFOL N/A 03/10/2020   Procedure: ESOPHAGOGASTRODUODENOSCOPY (EGD) WITH PROPOFOL;  Surgeon: Lavena Bullion, DO;  Location: [facility];  Service: Gastroenterology;  Laterality: N/A;   HEMOSTASIS CLIP PLACEMENT  03/10/2020   Procedure: HEMOSTASIS CLIP PLACEMENT;  Surgeon: Lavena Bullion, DO;  Location: [place] [facility];  Service: Gastroenterology;;   HEMOSTASIS CONTROL  03/10/2020   Procedure: HEMOSTASIS CONTROL;  Surgeon: Lavena Bullion, DO;  Location: [place] [facility];  Service: Gastroenterology;;  epi   INTRA-AORTIC BALLOON PUMP  INSERTION  10/29/2012   Procedure: INTRA-AORTIC BALLOON PUMP INSERTION;  Surgeon: Burnell Blanks, MD;  Location: [facility] CATH LAB;  Service: Cardiovascular;;   LEFT HEART CATHETERIZATION WITH CORONARY ANGIOGRAM N/A 10/29/2012   Procedure: LEFT HEART CATHETERIZATION WITH CORONARY ANGIOGRAM;  Surgeon: Burnell Blanks, MD;  Location: [facility] CATH LAB;  Service: Cardiovascular;  Laterality: N/A;   PERCUTANEOUS CORONARY STENT INTERVENTION (PCI-S)  10/29/2012   Procedure: PERCUTANEOUS CORONARY STENT INTERVENTION (PCI-S);  Surgeon: Burnell Blanks, MD;  Location: [facility] CATH LAB;  Service: Cardiovascular;;   TUBAL LIGATION      Prior to Admission medications   Medication Sig Start Date End Date Taking? Authorizing Provider  acetaminophen (TYLENOL) 325 MG tablet Take 650 mg by mouth every 6 (six) hours as needed for mild pain or headache. For headache or pain   Yes [provider]  ascorbic acid (VITAMIN C) 500 MG tablet Take 1 tablet (500 mg total) by mouth 2 (two) times daily. 07/31/21  Yes Love, Ivan Anchors, PA-C  atorvastatin (LIPITOR) 80 MG tablet Take 1 tablet (80 mg total) by mouth daily at 6 PM. 07/31/21  Yes Love, Ivan Anchors, PA-C  budesonide-formoterol (SYMBICORT) 160-4.5 MCG/ACT inhaler Inhale 2 puffs into the lungs 2 (two) times daily. 08/01/21  Yes Love, Ivan Anchors, PA-C  busPIRone (BUSPAR) 5 MG tablet Take 1 tablet (5 mg total) by mouth 3 (three) times daily. 09/12/21  Yes Lovorn, Jinny Blossom, MD  clopidogrel (PLAVIX) 75 MG tablet Take 1 tablet (75 mg total) by mouth daily. 07/31/21  Yes Love, Ivan Anchors, PA-C  colchicine 0.6 MG tablet Take 1 tablet (0.6 mg total) by mouth daily as needed (for gout flare). 08/28/21  Yes Lovorn, Jinny Blossom, MD  diclofenac Sodium (VOLTAREN) 1 % GEL See admin instructions. 08/01/21  Yes [provider]  DULoxetine (CYMBALTA) 60 MG capsule Take 1 capsule (60 mg total) by mouth daily. 08/28/21  Yes Lovorn, Jinny Blossom, MD  fluticasone (FLONASE) 50 MCG/ACT nasal spray  Place 1 spray into both nostrils daily. 08/01/21  Yes Love, Ivan Anchors, PA-C  furosemide (LASIX) 40 MG tablet Take 1 tablet (40 mg total) by mouth daily. 08/01/21  Yes Love, Ivan Anchors, PA-C  gabapentin (NEURONTIN) 600 MG tablet Take 1 tablet (600 mg total) by mouth 2 (two) times daily. X  1week, then IF need be, increase to 600 mg 3x/day- (1 in Am/2 at night) 08/28/21  Yes Lovorn, Jinny Blossom, MD  Javier Docker Oil 500 MG CAPS Take by mouth daily.   Yes [provider]  loratadine (CLARITIN) 10 MG tablet Take 1 tablet (10 mg total) by mouth daily. 08/01/21  Yes Love, Ivan Anchors, PA-C  methocarbamol (ROBAXIN) 750 MG tablet Take 1 tablet (750 mg total) by mouth every 6 (six) hours as needed for muscle spasms. 09/12/21  Yes Lovorn, Jinny Blossom, MD  metoprolol tartrate (LOPRESSOR) 25 MG tablet Take 1 tablet (25 mg total) by mouth 2 (two) times daily. 07/31/21  Yes Love, Ivan Anchors, PA-C  pantoprazole (PROTONIX) 40 MG tablet Take 1 tablet (40 mg total) by mouth daily. 07/31/21 01/27/22 Yes Love, Ivan Anchors, PA-C  potassium chloride SA (KLOR-CON) 20 MEQ tablet Take 1 tablet (20 mEq total) by mouth 2 (two) times daily. 07/31/21  Yes Love, Pecolia Ades  VENTOLIN HFA 108 (90 Base) MCG/ACT inhaler SMARTSIG:2 Puff(s) By Mouth Every 4-6 Hours PRN 03/16/21  Yes [provider]  VITAMIN D PO Take by mouth.   Yes [provider]  Zinc Sulfate 220 (50 Zn) MG TABS Take 1 tablet (220 mg total) by mouth daily. 08/01/21  Yes Love, Ivan Anchors, PA-C  levofloxacin (LEVAQUIN) 750 MG tablet Take 1 tablet (750 mg total) by mouth daily. Patient not taking: Reported on 11/07/2021 10/22/21   Gwynne Edinger, MD  lidocaine (LIDODERM) 5 % Place 1 patch onto the skin daily. Apply to lower back at 7 am and remove at 7 pm daily. 07/31/21   Love, Ivan Anchors, PA-C  predniSONE (DELTASONE) 20 MG tablet Take 2 tablets (40 mg total) by mouth daily with breakfast. Patient not taking: Reported on 11/07/2021 10/23/21   Wouk, Ailene Rud, MD  Psyllium  58.12 % PACK Mix 1 packet in liquid and drink daily. 08/01/21   Bary Leriche, PA-C    Allergies as of 09/30/2021 - Review Complete 09/23/2021  Allergen Reaction Noted   Shellfish allergy Nausea And Vomiting 01/31/2016   Codeine Other (See Comments) 11/30/2012   Lidocaine Palpitations 03/10/2020    Family History  Problem Relation Age of Onset   Lung cancer Father    Heart attack Brother    Stroke Maternal Grandmother    Colon cancer Neg Hx    Pancreatic cancer Neg Hx    Prostate cancer Neg Hx    Rectal cancer Neg Hx    Stomach cancer Neg Hx     Social History   Socioeconomic History   Marital status: Married    Spouse name: Not on  file   Number of children: Not on file   Years of education: Not on file   Highest education level: Not on file  Occupational History   Not on file  Tobacco Use   Smoking status: Former    Packs/day: 1.00    Years: 20.00    Pack years: 20.00    Types: Cigarettes    Quit date: 11/05/2012    Years since quitting: 9.0   Smokeless tobacco: Never  Vaping Use   Vaping Use: Never used  Substance and Sexual Activity   Alcohol use: No   Drug use: No   Sexual activity: Not on file  Other Topics Concern   Not on file  Social History Narrative   Not on file   Social Determinants of Health   Financial Resource Strain: Not on file  Food Insecurity: Not on file  Transportation Needs: Not on file  Physical Activity: Not on file  Stress: Not on file  Social Connections: Not on file  Intimate Partner Violence: Not on file    Review of Systems: See HPI, otherwise negative ROS  Physical Exam: BP (!) 188/94    Pulse 82    Temp 98.1 F (36.7 C) (Temporal)    Resp 18    Ht 5\' 2"  (1.575 m)    Wt 81.6 kg    SpO2 97%    BMI 32.92 kg/m  General:   Alert,  pleasant and cooperative in NAD Head:  Normocephalic and atraumatic. Lungs:  Clear to auscultation.    Heart:  Regular rate and rhythm.   Impression/Plan: Samantha King is here for  ophthalmic surgery.  Risks, benefits, limitations, and alternatives regarding ophthalmic surgery have been reviewed with the patient.  Questions have been answered.  All parties agreeable.   Leandrew Koyanagi, MD  11/20/2021, 9:17 AM

## 2021-11-20 NOTE — Transfer of Care (Signed)
Immediate Anesthesia Transfer of Care Note  Patient: Samantha King  Procedure(s) Performed: CATARACT EXTRACTION PHACO AND INTRAOCULAR LENS PLACEMENT (IOC) LEFT 3.91 00:48.3 (Left: Eye)  Patient Location: PACU  Anesthesia Type: MAC  Level of Consciousness: awake, alert  and patient cooperative  Airway and Oxygen Therapy: Patient Spontanous Breathing and Patient connected to supplemental oxygen  Post-op Assessment: Post-op Vital signs reviewed, Patient's Cardiovascular Status Stable, Respiratory Function Stable, Patent Airway and No signs of Nausea or vomiting  Post-op Vital Signs: Reviewed and stable  Complications: No notable events documented.

## 2021-11-20 NOTE — Anesthesia Postprocedure Evaluation (Signed)
Anesthesia Post Note  Patient: Samantha King  Procedure(s) Performed: CATARACT EXTRACTION PHACO AND INTRAOCULAR LENS PLACEMENT (IOC) LEFT 3.91 00:48.3 (Left: Eye)     Patient location during evaluation: PACU Anesthesia Type: MAC Level of consciousness: awake and alert Pain management: pain level controlled Vital Signs Assessment: post-procedure vital signs reviewed and stable Respiratory status: spontaneous breathing Cardiovascular status: stable Anesthetic complications: no   No notable events documented.  Gillian Scarce

## 2021-11-21 ENCOUNTER — Encounter: Payer: Self-pay | Admitting: Ophthalmology

## 2021-11-22 ENCOUNTER — Ambulatory Visit (HOSPITAL_BASED_OUTPATIENT_CLINIC_OR_DEPARTMENT_OTHER): Payer: Medicare Other | Admitting: Family

## 2021-11-25 ENCOUNTER — Ambulatory Visit: Payer: Medicare Other | Admitting: Physical Therapy

## 2021-11-27 ENCOUNTER — Ambulatory Visit: Payer: Medicare Other | Admitting: Physical Therapy

## 2021-11-29 ENCOUNTER — Ambulatory Visit: Payer: Medicare Other | Admitting: Physical Medicine and Rehabilitation

## 2021-12-02 ENCOUNTER — Ambulatory Visit: Payer: Medicare Other | Admitting: Physical Therapy

## 2021-12-04 ENCOUNTER — Ambulatory Visit: Payer: Medicare Other | Admitting: Physical Therapy

## 2021-12-09 ENCOUNTER — Ambulatory Visit: Payer: Medicare Other | Admitting: Physical Therapy

## 2021-12-09 ENCOUNTER — Encounter: Payer: Self-pay | Admitting: Cardiovascular Disease

## 2021-12-09 ENCOUNTER — Ambulatory Visit (INDEPENDENT_AMBULATORY_CARE_PROVIDER_SITE_OTHER): Payer: Medicare Other | Admitting: Cardiovascular Disease

## 2021-12-09 ENCOUNTER — Other Ambulatory Visit: Payer: Self-pay

## 2021-12-09 VITALS — BP 148/92 | HR 100 | Ht 63.0 in | Wt 178.0 lb

## 2021-12-09 DIAGNOSIS — I2510 Atherosclerotic heart disease of native coronary artery without angina pectoris: Secondary | ICD-10-CM

## 2021-12-09 DIAGNOSIS — I493 Ventricular premature depolarization: Secondary | ICD-10-CM | POA: Diagnosis not present

## 2021-12-09 DIAGNOSIS — I10 Essential (primary) hypertension: Secondary | ICD-10-CM

## 2021-12-09 DIAGNOSIS — I480 Paroxysmal atrial fibrillation: Secondary | ICD-10-CM | POA: Diagnosis not present

## 2021-12-09 DIAGNOSIS — E782 Mixed hyperlipidemia: Secondary | ICD-10-CM | POA: Diagnosis not present

## 2021-12-09 NOTE — Progress Notes (Signed)
Chief Complaint  Patient presents with   Follow-up    CAD   History of Present Illness: 76 yo female with history of CAD, HTN, HLD, isolated episode of atrial fibrillation, PVCs and mild aortic stenosis who is here today for cardiac follow up. She was admitted to [facility] December 2013 with an inferior STEMI. She suffered cardiac arrest en route requiring CPR and defibrillation. Emergent cardiac cath showed proximal RCA occlusion with irregularities in the LAD and Circumflex. A drug eluting stent was placed in the proximal RCA. Echo 10/30/12 with moderate LVH, EF 55-60%, normal wall motion, PASP 45. She required temporary pacemaker implantation for bradycardia during the cath. She had recurrent ventricular fibrillation post PCI requiring defibrillation. She also required IABP briefly for cardiogenic shock. Just before her event, she tested positive for H1N1 flu. Echo February 2018 with normal LV function. Contrast used and did not demonstrate LV thrombus. She was admitted with GI bleeding in May 2021. Plavix was held but has been restarted.  She was admitted to [facility] in August 2022 with back pain, pneumonia and developed rapid atrial fib. She converted to sinus spontaneously on IV amiodarone. Echo August 2022 with LVEF=60-65%, mild aortic stenosis. She was admitted to [facility] in December 2022 with recurrent pneumonia and had a mild bump in troponin felt to be due to demand ischemia.   She is here today for follow up. The patient denies any chest pain, dyspnea, palpitations, lower extremity edema, orthopnea, PND, dizziness, near syncope or syncope. She is doing well overall. She and her husband are moving to [place], [place] in March 2023 to be near their children and grandchildren.   Primary Care Physician:  Haywood Pao, MD  Past Medical History:  Diagnosis Date   Atrial fibrillation [facility])    CAD (coronary artery disease)    a. s/p INF-LAT STEMI => s/p DES-RCA c/b VF arrest and  cardiogenic shock   Cardiac arrest ([place]) 10/2012   STEMI with VF arrest x 2    Complication of anesthesia    ICU delerium after 7 day "induced coma" after MI in 2013   GERD (gastroesophageal reflux disease)    H1N1 influenza 10/2012   Hx of echocardiogram    a. Echo 10/30/12: Moderate LVH, EF 55-60%, normal wall motion, PASP 45   Hyperlipidemia    Hypertension    Myocardial infarction ([place])    Pneumonia 10/2012   a. STEMI c/b LLL pneumonia in setting of recent H1N1 influenza   Pneumonia 10/20/2021   Completed meds.  Symptom free 11/03/21   PONV (postoperative nausea and vomiting)    after BTL   PUD (peptic ulcer disease)    PVC (premature ventricular contraction)    Syncope 1996   reported episode while driving in [place] in 1996 with full cardiac workup = negative   Tobacco abuse     Past Surgical History:  Procedure Laterality Date   BREAST EXCISIONAL BIOPSY     left breast ? 1981   CATARACT EXTRACTION W/PHACO Left 11/20/2021   Procedure: CATARACT EXTRACTION PHACO AND INTRAOCULAR LENS PLACEMENT (IOC) LEFT 3.91 00:48.3;  Surgeon: Leandrew Koyanagi, MD;  Location: [facility];  Service: Ophthalmology;  Laterality: Left;   CORONARY ANGIOPLASTY WITH STENT PLACEMENT     s/p inferior STEMI c/b VF arrest and cardiogenic shock requiring IABP   ESOPHAGOGASTRODUODENOSCOPY (EGD) WITH PROPOFOL N/A 03/10/2020   Procedure: ESOPHAGOGASTRODUODENOSCOPY (EGD) WITH PROPOFOL;  Surgeon: Lavena Bullion, DO;  Location: [facility] ENDOSCOPY;  Service:  Gastroenterology;  Laterality: N/A;   HEMOSTASIS CLIP PLACEMENT  03/10/2020   Procedure: HEMOSTASIS CLIP PLACEMENT;  Surgeon: Lavena Bullion, DO;  Location: [place] ENDOSCOPY;  Service: Gastroenterology;;   HEMOSTASIS CONTROL  03/10/2020   Procedure: HEMOSTASIS CONTROL;  Surgeon: Lavena Bullion, DO;  Location: [facility]OSCOPY;  Service: Gastroenterology;;  epi   INTRA-AORTIC BALLOON PUMP INSERTION  10/29/2012   Procedure: INTRA-AORTIC BALLOON PUMP  INSERTION;  Surgeon: Burnell Blanks, MD;  Location: [facility] CATH LAB;  Service: Cardiovascular;;   LEFT HEART CATHETERIZATION WITH CORONARY ANGIOGRAM N/A 10/29/2012   Procedure: LEFT HEART CATHETERIZATION WITH CORONARY ANGIOGRAM;  Surgeon: Burnell Blanks, MD;  Location: [facility] CATH LAB;  Service: Cardiovascular;  Laterality: N/A;   PERCUTANEOUS CORONARY STENT INTERVENTION (PCI-S)  10/29/2012   Procedure: PERCUTANEOUS CORONARY STENT INTERVENTION (PCI-S);  Surgeon: Burnell Blanks, MD;  Location: [facility] CATH LAB;  Service: Cardiovascular;;   TUBAL LIGATION      Current Outpatient Medications  Medication Sig Dispense Refill   acetaminophen (TYLENOL) 325 MG tablet Take 650 mg by mouth every 6 (six) hours as needed for mild pain or headache. For headache or pain     ascorbic acid (VITAMIN C) 500 MG tablet Take 1 tablet (500 mg total) by mouth 2 (two) times daily. 60 tablet 0   atorvastatin (LIPITOR) 80 MG tablet Take 1 tablet (80 mg total) by mouth daily at 6 PM. 30 tablet 0   budesonide-formoterol (SYMBICORT) 160-4.5 MCG/ACT inhaler Inhale 2 puffs into the lungs 2 (two) times daily. 1 each 12   busPIRone (BUSPAR) 5 MG tablet Take 1 tablet (5 mg total) by mouth 3 (three) times daily. 90 tablet 5   clopidogrel (PLAVIX) 75 MG tablet Take 1 tablet (75 mg total) by mouth daily. 30 tablet 0   colchicine 0.6 MG tablet Take 1 tablet (0.6 mg total) by mouth daily as needed (for gout flare). 30 tablet 5   diclofenac Sodium (VOLTAREN) 1 % GEL See admin instructions.     DULoxetine (CYMBALTA) 60 MG capsule Take 1 capsule (60 mg total) by mouth daily. 30 capsule 5   fluticasone (FLONASE) 50 MCG/ACT nasal spray Place 1 spray into both nostrils daily. 16 g 0   furosemide (LASIX) 40 MG tablet Take 1 tablet (40 mg total) by mouth daily. 30 tablet 0   gabapentin (NEURONTIN) 600 MG tablet Take 1 tablet (600 mg total) by mouth 2 (two) times daily. X  1week, then IF need be, increase to 600 mg 3x/day- (1 in  Am/2 at night) 90 tablet 5   Krill Oil 500 MG CAPS Take by mouth daily.     lidocaine (LIDODERM) 5 % Place 1 patch onto the skin daily. Apply to lower back at 7 am and remove at 7 pm daily. 30 patch 0   loratadine (CLARITIN) 10 MG tablet Take 1 tablet (10 mg total) by mouth daily.     methocarbamol (ROBAXIN) 750 MG tablet Take 1 tablet (750 mg total) by mouth every 6 (six) hours as needed for muscle spasms. 120 tablet 5   metoprolol tartrate (LOPRESSOR) 25 MG tablet Take 1 tablet (25 mg total) by mouth 2 (two) times daily. 60 tablet 0   pantoprazole (PROTONIX) 40 MG tablet Take 1 tablet (40 mg total) by mouth daily. 30 tablet 0   potassium chloride SA (KLOR-CON) 20 MEQ tablet Take 1 tablet (20 mEq total) by mouth 2 (two) times daily. 60 tablet 0   predniSONE (DELTASONE) 20 MG tablet Take 2  tablets (40 mg total) by mouth daily with breakfast. 4 tablet 0   Psyllium 58.12 % PACK Mix 1 packet in liquid and drink daily. 30 each 0   VENTOLIN HFA 108 (90 Base) MCG/ACT inhaler SMARTSIG:2 Puff(s) By Mouth Every 4-6 Hours PRN     VITAMIN D PO Take by mouth.     Zinc Sulfate 220 (50 Zn) MG TABS Take 1 tablet (220 mg total) by mouth daily. 30 tablet 0   No current facility-administered medications for this visit.    Allergies  Allergen Reactions   Shellfish Allergy Nausea And Vomiting   Codeine Other (See Comments)    Makes me hyperactive and not able to sleep   Lidocaine Palpitations    Social History   Socioeconomic History   Marital status: Married    Spouse name: Not on file   Number of children: Not on file   Years of education: Not on file   Highest education level: Not on file  Occupational History   Not on file  Tobacco Use   Smoking status: Former    Packs/day: 1.00    Years: 20.00    Pack years: 20.00    Types: Cigarettes    Quit date: 11/05/2012    Years since quitting: 9.0   Smokeless tobacco: Never  Vaping Use   Vaping Use: Never used  Substance and Sexual Activity    Alcohol use: No   Drug use: No   Sexual activity: Not on file  Other Topics Concern   Not on file  Social History Narrative   Not on file   Social Determinants of Health   Financial Resource Strain: Not on file  Food Insecurity: Not on file  Transportation Needs: Not on file  Physical Activity: Not on file  Stress: Not on file  Social Connections: Not on file  Intimate Partner Violence: Not on file    Family History  Problem Relation Age of Onset   Lung cancer Father    Heart attack Brother    Stroke Maternal Grandmother    Colon cancer Neg Hx    Pancreatic cancer Neg Hx    Prostate cancer Neg Hx    Rectal cancer Neg Hx    Stomach cancer Neg Hx     Review of Systems:  As stated in the HPI and otherwise negative.   BP (!) 148/92    Pulse 100    Ht 5\' 3"  (1.6 m)    Wt 178 lb (80.7 kg)    SpO2 94%    BMI 31.53 kg/m   Physical Examination: General: Well developed, well nourished, NAD  HEENT: OP clear, mucus membranes moist  SKIN: warm, dry. No rashes. Neuro: No focal deficits  Musculoskeletal: Muscle strength 5/5 all ext  Psychiatric: Mood and affect normal  Neck: No JVD, no carotid bruits, no thyromegaly, no lymphadenopathy.  Lungs:Clear bilaterally, no wheezes, rhonci, crackles Cardiovascular: Regular rate and rhythm with ectopy. Soft systolic murmur.  Abdomen:Soft. Bowel sounds present. Non-tender.  Extremities: No lower extremity edema.   Echo August 2022:   1. Left ventricular ejection fraction, by estimation, is 60 to 65%. The  left ventricle has normal function. The left ventricle has no regional  wall motion abnormalities. There is moderate asymmetric left ventricular  hypertrophy of the basal-septal  segment. Left ventricular diastolic parameters are consistent with Grade  II diastolic dysfunction (pseudonormalization).   2. Right ventricular systolic function is normal. The right ventricular  size is normal. There is mildly  elevated pulmonary artery  systolic  pressure. The estimated right ventricular systolic pressure is 18.8 mmHg.   3. Left atrial size was mildly dilated.   4. The mitral valve is normal in structure. Trivial mitral valve  regurgitation. No evidence of mitral stenosis. Moderate mitral annular  calcification.   5. Tricuspid valve regurgitation is moderate.   6. The aortic valve is calcified. Aortic valve regurgitation is trivial.  Mild to moderate aortic valve sclerosis/calcification is present, without  any evidence of aortic stenosis. Aortic valve mean gradient measures 11.0  mmHg. Aortic valve Vmax measures   2.18 m/s.   7. The inferior vena cava is normal in size with greater than 50%  respiratory variability, suggesting right atrial pressure of 3 mmHg.   EKG:  EKG is ordered today. The ekg ordered today demonstrates sinus with PVCs in pattern of bigeminy  Recent Labs: 06/18/2021: TSH 0.793 07/29/2021: ALT 24 10/21/2021: B Natriuretic Peptide 829.4; Hemoglobin 11.3; Platelets 426 10/22/2021: BUN 25; Creatinine, Ser 0.99; Magnesium 2.1; Potassium 3.8; Sodium 132    Wt Readings from Last 3 Encounters:  12/09/21 178 lb (80.7 kg)  11/20/21 180 lb (81.6 kg)  10/20/21 176 lb (79.8 kg)     Other studies Reviewed: Additional studies/ records that were reviewed today include: . Review of the above records demonstrates:    Assessment and Plan:   1. CAD without angina: No chest pain. Her recent troponin elevation in setting of pneumonia was likely due to demand ischemia. Will continue Plavix, beta blocker and statin.   2. HTN: BP is well controlled at home. Home BP log reviewed today. No changes.   3. Hyperlipidemia: Lipids followed in primary care. LDL 54 in June 2022. Continue statin   4. Atrial fibrillation, paroxysmal: Occurred in setting of pneumonia. Will not plan to start long term anti-coagulation unless there is recurrence. Continue beta blocker. (Lopressor 25 mg po BID)  5. PVC: No awareness of  palpitations. Continue beta blocker  Current medicines are reviewed at length with the patient today.  The patient does not have concerns regarding medicines.  The following changes have been made:  no change  Labs/ tests ordered today include:   No orders of the defined types were placed in this encounter.  Disposition:   No F/U planned. She is moving to [place].   Signed, Lauree Chandler, MD 12/09/2021 2:25 PM    [facility] [place], [place], [place]  [address] Phone: [phone]; Fax: [phone]

## 2021-12-09 NOTE — Patient Instructions (Addendum)
Medication Instructions:  Your physician recommends that you continue on your current medications as directed. Please refer to the Current Medication list given to you today.  *If you need a refill on your cardiac medications before your next appointment, please call your pharmacy*   Lab Work: None ordered   If you have labs (blood work) drawn today and your tests are completely normal, you will receive your results only by: [facility] (if you have MyChart) OR A paper copy in the mail If you have any lab test that is abnormal or we need to change your treatment, we will call you to review the results.   Testing/Procedures: None ordered    Other Instructions None

## 2021-12-10 ENCOUNTER — Ambulatory Visit (HOSPITAL_BASED_OUTPATIENT_CLINIC_OR_DEPARTMENT_OTHER): Payer: Medicare Other | Admitting: Family

## 2021-12-11 ENCOUNTER — Other Ambulatory Visit: Payer: Self-pay | Admitting: Internal Medicine

## 2021-12-11 ENCOUNTER — Ambulatory Visit: Payer: Medicare Other | Admitting: Physical Therapy

## 2021-12-11 ENCOUNTER — Other Ambulatory Visit (HOSPITAL_COMMUNITY): Payer: Self-pay | Admitting: *Deleted

## 2021-12-11 DIAGNOSIS — Z1231 Encounter for screening mammogram for malignant neoplasm of breast: Secondary | ICD-10-CM

## 2021-12-12 ENCOUNTER — Other Ambulatory Visit: Payer: Self-pay

## 2021-12-12 ENCOUNTER — Ambulatory Visit (HOSPITAL_COMMUNITY)
Admission: RE | Admit: 2021-12-12 | Discharge: 2021-12-12 | Disposition: A | Discharge status: Home / Self Care | Payer: Medicare Other | Source: Ambulatory Visit | Attending: Internal Medicine | Admitting: Internal Medicine

## 2021-12-12 DIAGNOSIS — M810 Age-related osteoporosis without current pathological fracture: Secondary | ICD-10-CM | POA: Diagnosis not present

## 2021-12-12 MED ORDER — DENOSUMAB 60 MG/ML ~~LOC~~ SOSY
PREFILLED_SYRINGE | SUBCUTANEOUS | Status: AC
  Filled 2021-12-12: qty 1

## 2021-12-12 MED ORDER — DENOSUMAB 60 MG/ML ~~LOC~~ SOSY
60.0000 mg | PREFILLED_SYRINGE | Freq: Once | SUBCUTANEOUS | Status: AC
  Administered 2021-12-12: 60 mg via SUBCUTANEOUS

## 2021-12-17 ENCOUNTER — Other Ambulatory Visit: Payer: Self-pay

## 2021-12-17 ENCOUNTER — Ambulatory Visit: Payer: Medicare Other | Attending: Physical Medicine and Rehabilitation | Admitting: Physical Therapy

## 2021-12-17 ENCOUNTER — Encounter: Payer: Self-pay | Admitting: Physical Therapy

## 2021-12-17 DIAGNOSIS — R293 Abnormal posture: Secondary | ICD-10-CM | POA: Diagnosis not present

## 2021-12-17 DIAGNOSIS — R2689 Other abnormalities of gait and mobility: Secondary | ICD-10-CM | POA: Insufficient documentation

## 2021-12-17 DIAGNOSIS — R2681 Unsteadiness on feet: Secondary | ICD-10-CM | POA: Diagnosis not present

## 2021-12-17 NOTE — Therapy (Signed)
[place] MAIN [facility] SERVICES [address] [place], [place], [address] Phone: [phone]   Fax:  [phone]  Physical Therapy Re- Evaluation  Patient Details  Name: Samantha King MRN: [id number] Date of Birth: [birth year] Referring Provider (PT): Courtney Heys, MD   Encounter Date: 12/17/2021   PT End of Session - 12/17/21 1419     Visit Number 5    Number of Visits 33    Date for PT Re-Evaluation 02/11/22    Authorization Type eval 11/9, re-eval 12/17/21    Progress Note Due on Visit 10    PT Start Time 1696    PT Stop Time 1228    PT Time Calculation (min) 35 min    Equipment Utilized During Treatment Gait belt    Activity Tolerance Patient tolerated treatment well;Patient limited by fatigue    Behavior During Therapy [facility] for tasks assessed/performed             Past Medical History:  Diagnosis Date   Atrial fibrillation ([place])    CAD (coronary artery disease)    a. s/p INF-LAT STEMI => s/p DES-RCA c/b VF arrest and cardiogenic shock   Cardiac arrest ([place]) 10/2012   STEMI with VF arrest x 2    Complication of anesthesia    ICU delerium after 7 day "induced coma" after MI in 2013   GERD (gastroesophageal reflux disease)    H1N1 influenza 10/2012   Hx of echocardiogram    a. Echo 10/30/12: Moderate LVH, EF 55-60%, normal wall motion, PASP 45   Hyperlipidemia    Hypertension    Myocardial infarction ([place])    Pneumonia 10/2012   a. STEMI c/b LLL pneumonia in setting of recent H1N1 influenza   Pneumonia 10/20/2021   Completed meds.  Symptom free 11/03/21   PONV (postoperative nausea and vomiting)    after BTL   PUD (peptic ulcer disease)    PVC (premature ventricular contraction)    Syncope 1996   reported episode while driving in [place] in 1996 with full cardiac workup = negative   Tobacco abuse     Past Surgical History:  Procedure Laterality Date   BREAST EXCISIONAL BIOPSY     left breast ? 1981   CATARACT  EXTRACTION W/PHACO Left 11/20/2021   Procedure: CATARACT EXTRACTION PHACO AND INTRAOCULAR LENS PLACEMENT (IOC) LEFT 3.91 00:48.3;  Surgeon: Leandrew Koyanagi, MD;  Location: [facility];  Service: Ophthalmology;  Laterality: Left;   CORONARY ANGIOPLASTY WITH STENT PLACEMENT     s/p inferior STEMI c/b VF arrest and cardiogenic shock requiring IABP   ESOPHAGOGASTRODUODENOSCOPY (EGD) WITH PROPOFOL N/A 03/10/2020   Procedure: ESOPHAGOGASTRODUODENOSCOPY (EGD) WITH PROPOFOL;  Surgeon: Lavena Bullion, DO;  Location: [facility] ENDOSCOPY;  Service: Gastroenterology;  Laterality: N/A;   HEMOSTASIS CLIP PLACEMENT  03/10/2020   Procedure: HEMOSTASIS CLIP PLACEMENT;  Surgeon: Lavena Bullion, DO;  Location: [facility] ENDOSCOPY;  Service: Gastroenterology;;   HEMOSTASIS CONTROL  03/10/2020   Procedure: HEMOSTASIS CONTROL;  Surgeon: Lavena Bullion, DO;  Location: [facility] ENDOSCOPY;  Service: Gastroenterology;;  epi   INTRA-AORTIC BALLOON PUMP INSERTION  10/29/2012   Procedure: INTRA-AORTIC BALLOON PUMP INSERTION;  Surgeon: Burnell Blanks, MD;  Location: [facility] CATH LAB;  Service: Cardiovascular;;   LEFT HEART CATHETERIZATION WITH CORONARY ANGIOGRAM N/A 10/29/2012   Procedure: LEFT HEART CATHETERIZATION WITH CORONARY ANGIOGRAM;  Surgeon: Burnell Blanks, MD;  Location: [facility] CATH LAB;  Service: Cardiovascular;  Laterality: N/A;   PERCUTANEOUS CORONARY STENT INTERVENTION (PCI-S)  10/29/2012   Procedure: PERCUTANEOUS CORONARY STENT INTERVENTION (PCI-S);  Surgeon: Burnell Blanks, MD;  Location: [facility] CATH LAB;  Service: Cardiovascular;;   TUBAL LIGATION      There were no vitals filed for this visit.    Subjective Assessment - 12/17/21 1158     Subjective Patient reports she and her husband did sell their home. They are planning on moving in March. Her last PT appointment was 10/01/21. She had double pneumonia and was hospitalized for 3 days. Patient was then discharged home. She has not done any  therapy since then. She also had cataract surgery 11/20/21. She returns 12/31/21 for follow up. She does not have any activity restrictions at this time; Denies any new falls; She presents to therapy with rollator, she reports using it about 30-40% of the time; She will use it when going out of the home, but around the house she doesn't need it. At home, she doesn't use any assistive device;    Patient is accompained by: Family member    Pertinent History On 06/18/2021 patient was admitted to ED for acute LBP and decompensated from septic shock while awaiting imaging; pt was transferred to ICU for Afib. Imaging and Labs resulted in Lumbar discitis and Osteomyelitis Dx. Pt Stabilized after 1 week and transferred to [facility] to wean supplemental O2 and continue Atbx for MSSA bacteremia. Once discharged from [facility], patient received HHPT. Other PMH significant for CAD, GERD, left breast biopsy, angioplasty with stent placement, hyperlipidemia, Hyperglycemia, Gout, nerve pain, and NSVT.    Limitations Standing;Lifting;Walking    Patient Stated Goals "I'd like to not have to use the Rollator or a shower chair anymore".    Currently in Pain? No/denies    Multiple Pain Sites No                [facility] PT Assessment - 12/17/21 0001       6 Minute walk- Post Test   6 Minute Walk Post Test yes    HR (bpm) 90    02 Sat (%RA) 92 %      6 minute walk test results    Aerobic Endurance Distance Walked 430    Endurance additional comments with rollator, required 2 seated rest breaks with increased shortness of breath      Standardized Balance Assessment   Standardized Balance Assessment Five Times Sit to Stand;10 meter walk test    Five times sit to stand comments  16.75 sec without UE assist    10 Meter Walk 1.2 m/s with rollator, 0.98 m/s without AD               TREATMENT: Patient presents to therapy after missing several months due to recent illness/cataract surgery; PT  addressed goals to determine new baseline.   Reinforced HEP- recommend patient start back with new HEP that was given in Nov 2022 including sit<>stand, marches and heel raises. Patient verbalized understanding;          Objective measurements completed on examination: See above findings.                PT Education - 12/17/21 1418     Education Details HEP reinforced, plan of care    Person(s) Educated Patient;Spouse    Methods Explanation    Comprehension Verbalized understanding              PT Short Term Goals - 12/17/21 1424       PT SHORT TERM GOAL #  1   Title Pt will be independent with HEP in order to improve endurance and balance to decrease fall risk and improve function at home and with grandkids.    Baseline Has HEP- has not been doing it due to medical issues;    Time 4    Period Weeks    Status On-going    Target Date 01/14/22               PT Long Term Goals - 12/17/21 1218       PT LONG TERM GOAL #1   Title Pt will decrease 5TSTS by at least 3 seconds in order to demonstrate clinically significant improvement in LE strength.    Baseline 11/9: 15.15sec with no UE support, 2/7: 16.75 sec without UE assist    Time 8    Period Weeks    Status On-going    Target Date 02/11/22      PT LONG TERM GOAL #2   Title Pt will increase 6MWT to >1545 ft in order to demonstrate clinically significant improvement in cardiopulmonary endurance and safety with community ambulation compared to age matched peers.    Baseline 11/9: 520 ft with 1 seated rest break at 340 ft, 2/7: 430 feet with rollator    Time 8    Period Weeks    Status On-going    Target Date 02/11/22      PT LONG TERM GOAL #3   Title Pt will increase gait speed in 10MWT to >1.32ms without AD to improve safe community ambulation and independence.    Baseline 11/9: 0.645m without AD. 2/7: 1.2 m/s with rollator, 0.98 m/s without AD    Time 8    Period Weeks    Status Partially Met     Target Date 02/11/22      PT LONG TERM GOAL #4   Title Patient will report taking a standing shower with no LOB or fatigue and no use of shower chair.    Baseline 11/9: Patient unable to take shower without using shower chair. 2/7: able to stand some    Time 8    Period Weeks    Status On-going    Target Date 02/11/22      PT LONG TERM GOAL #5   Title Patient will increase FOTO score to equal to or greater than 54% to demonstrate statistically significant improvement in mobility and quality of life.    Baseline 11/9: 46%    Time 8    Period Weeks    Status On-going    Target Date 02/11/22                    Plan - 12/17/21 1420     Clinical Impression Statement Patient presents to therapy after missing several weeks for various issues. She was last seen in outpatient PT on 10/01/21. Patient was admitted to hospital for pneumonia on 10/20/21. She was discharged 3 days later. Patient denies any home health or other outpatient PT. In January she had cataract surgery. She is now wanting to return to outpatient PT to work on conditioning and mobility. Patient continues to have impaired endurance with completing 430 feet on 6 min walk test (significantly below age group norms of >1200 feet). Patient is currently able to walk a short distance with and without rollator at a limited community ambulator speed. She is also able to complete 5 times sit<>Stand without pushing on chair, but requires increased time of 17 sec. Patient  exhibits fair strength in BLE. She would benefit from additional skilled PT Intervention to address balance, and improve cardiovascular conditioning and mobility; Patient plans to move to [place] in March;    Personal Factors and Comorbidities Age;Comorbidity 3+;Fitness;Past/Current Experience    Examination-Activity Limitations Lift;Carry;Reach Overhead    Examination-Participation Restrictions Church;Volunteer;Yard Work;Community Activity;Driving     Stability/Clinical Decision Making Evolving/Moderate complexity    Rehab Potential Good    PT Frequency 2x / week    PT Duration 8 weeks    PT Treatment/Interventions ADLs/Self Care Home Management;Moist Heat;DME Instruction;Gait training;Stair training;Functional mobility training;Therapeutic activities;Therapeutic exercise;Balance training;Neuromuscular re-education;Patient/family education;Energy conservation;Manual techniques    PT Next Visit Plan assess balance/gait via FGA, advance HEP    PT Home Exercise Plan no change this session;    Consulted and Agree with Plan of Care Patient;Family member/caregiver    Family Member Consulted Husband             Patient will benefit from skilled therapeutic intervention in order to improve the following deficits and impairments:  Abnormal gait, Decreased balance, Decreased endurance, Difficulty walking, Cardiopulmonary status limiting activity, Pain, Decreased activity tolerance, Decreased strength, Postural dysfunction  Visit Diagnosis: Other abnormalities of gait and mobility  Unsteadiness on feet  Abnormal posture     Problem List Patient Active Problem List   Diagnosis Date Noted   Healthcare-associated pneumonia 10/20/2021   HCAP (healthcare-associated pneumonia) 10/20/2021   Nerve pain 08/28/2021   Lumbar discitis 08/07/2021   Septic embolism ([place]) 08/07/2021   Hyponatremia 07/31/2021   Situational anxiety 07/31/2021   Prerenal azotemia 07/31/2021   Abnormal LFTs 07/31/2021   Hypoalbuminemia 07/31/2021   Acute gout    Hyperglycemia    Leukocytosis    Loose stools    Debility 07/10/2021   Post-operative pain    Bacteremia    Acute gout involving toe    Incontinence of feces    Paroxysmal atrial fibrillation (H[facility])    Hypoxemia    Back pain 06/19/2021   Septic shock ([place]) 06/19/2021   COPD with acute exacerbation ([place]) 06/19/2021   Acute hypoxemic respiratory failure ([place]) 06/19/2021   Pulmonary nodules  06/19/2021   MSSA bacteremia 06/19/2021   Upper GI bleed 03/10/2020   Nausea 03/10/2020   Duodenal ulcer    Dieulafoy lesion of duodenum    Gastritis and gastroduodenitis    Coronary atherosclerosis of native coronary artery 11/10/2012   Tobacco abuse 11/09/2012   NSVT (nonsustained ventricular tachycardia) 11/09/2012   Acute paranoia ([place]) 11/09/2012   Severe muscle deconditioning 11/09/2012   Hyperlipidemia 11/09/2012   Hypokalemia 11/05/2012   Acute blood loss anemia 11/01/2012   H1N1 influenza with pneumonia 10/31/2012   Hypertension 10/29/2012   GERD (gastroesophageal reflux disease) 10/29/2012   Acute respiratory failure ([place]) 10/29/2012   Cardiogenic shock ([place]) 10/29/2012   Cardiac arrest ([place]) 10/29/2012   ST elevation myocardial infarction (STEMI) of inferior wall ([place]) 10/29/2012    Wayne Brunker, PT, DPT 12/17/2021, 2:25 PM  [place] MAIN [facility] SERVICES [address] [place], [place], [address] Phone: [phone]   Fax:  [phone]  Name: Samantha King MRN: [id number] Date of Birth: 1946/06/18

## 2021-12-19 ENCOUNTER — Ambulatory Visit: Payer: Medicare Other | Admitting: Physical Therapy

## 2021-12-19 ENCOUNTER — Ambulatory Visit: Payer: Medicare Other

## 2021-12-24 ENCOUNTER — Ambulatory Visit: Payer: Medicare Other

## 2021-12-24 ENCOUNTER — Other Ambulatory Visit: Payer: Self-pay

## 2021-12-24 DIAGNOSIS — R293 Abnormal posture: Secondary | ICD-10-CM | POA: Diagnosis not present

## 2021-12-24 DIAGNOSIS — R2689 Other abnormalities of gait and mobility: Secondary | ICD-10-CM

## 2021-12-24 DIAGNOSIS — R2681 Unsteadiness on feet: Secondary | ICD-10-CM

## 2021-12-24 NOTE — Therapy (Signed)
[facility] MAIN [facility] SERVICES [address] [place], [place], [address] Phone: [phone]   Fax:  [phone]  Physical Therapy Treatment  Patient Details  Name: Samantha King MRN: [id number] Date of Birth: 1945/12/03 Referring Provider (PT): Courtney Heys, MD   Encounter Date: 12/24/2021   PT End of Session - 12/24/21 1524     Visit Number 6    Number of Visits 33    Date for PT Re-Evaluation 02/11/22    Authorization Type eval 11/9, re-eval 12/17/21    Progress Note Due on Visit 10    PT Start Time 9628    PT Stop Time 1430    PT Time Calculation (min) 39 min    Equipment Utilized During Treatment Gait belt    Activity Tolerance Patient tolerated treatment well;Patient limited by fatigue    Behavior During Therapy [facility] for tasks assessed/performed             Past Medical History:  Diagnosis Date   Atrial fibrillation ([place])    CAD (coronary artery disease)    a. s/p INF-LAT STEMI => s/p DES-RCA c/b VF arrest and cardiogenic shock   Cardiac arrest ([place]) 10/2012   STEMI with VF arrest x 2    Complication of anesthesia    ICU delerium after 7 day "induced coma" after MI in 2013   GERD (gastroesophageal reflux disease)    H1N1 influenza 10/2012   Hx of echocardiogram    a. Echo 10/30/12: Moderate LVH, EF 55-60%, normal wall motion, PASP 45   Hyperlipidemia    Hypertension    Myocardial infarction ([place])    Pneumonia 10/2012   a. STEMI c/b LLL pneumonia in setting of recent H1N1 influenza   Pneumonia 10/20/2021   Completed meds.  Symptom free 11/03/21   PONV (postoperative nausea and vomiting)    after BTL   PUD (peptic ulcer disease)    PVC (premature ventricular contraction)    Syncope 1996   reported episode while driving in [place] in 1996 with full cardiac workup = negative   Tobacco abuse     Past Surgical History:  Procedure Laterality Date   BREAST EXCISIONAL BIOPSY     left breast ? 1981   CATARACT  EXTRACTION W/PHACO Left 11/20/2021   Procedure: CATARACT EXTRACTION PHACO AND INTRAOCULAR LENS PLACEMENT (IOC) LEFT 3.91 00:48.3;  Surgeon: Leandrew Koyanagi, MD;  Location: [place];  Service: Ophthalmology;  Laterality: Left;   CORONARY ANGIOPLASTY WITH STENT PLACEMENT     s/p inferior STEMI c/b VF arrest and cardiogenic shock requiring IABP   ESOPHAGOGASTRODUODENOSCOPY (EGD) WITH PROPOFOL N/A 03/10/2020   Procedure: ESOPHAGOGASTRODUODENOSCOPY (EGD) WITH PROPOFOL;  Surgeon: Lavena Bullion, DO;  Location: [place] [facility];  Service: Gastroenterology;  Laterality: N/A;   HEMOSTASIS CLIP PLACEMENT  03/10/2020   Procedure: HEMOSTASIS CLIP PLACEMENT;  Surgeon: Lavena Bullion, DO;  Location: [place] [facility];  Service: Gastroenterology;;   HEMOSTASIS CONTROL  03/10/2020   Procedure: HEMOSTASIS CONTROL;  Surgeon: Lavena Bullion, DO;  Location: [place] [facility];  Service: Gastroenterology;;  epi   INTRA-AORTIC BALLOON PUMP INSERTION  10/29/2012   Procedure: INTRA-AORTIC BALLOON PUMP INSERTION;  Surgeon: Burnell Blanks, MD;  Location: [facility] C[facility] LAB;  Service: Cardiovascular;;   LEFT HEART CATHETERIZATION WITH CORONARY ANGIOGRAM N/A 10/29/2012   Procedure: LEFT HEART CATHETERIZATION WITH CORONARY ANGIOGRAM;  Surgeon: Burnell Blanks, MD;  Location: [facility] CATH LAB;  Service: Cardiovascular;  Laterality: N/A;   PERCUTANEOUS CORONARY STENT INTERVENTION (PCI-S)  10/29/2012   Procedure: PERCUTANEOUS CORONARY STENT INTERVENTION (PCI-S);  Surgeon: Burnell Blanks, MD;  Location: [facility] CATH LAB;  Service: Cardiovascular;;   TUBAL LIGATION      There were no vitals filed for this visit.   Subjective Assessment - 12/24/21 1524     Subjective Patient reports compliance with HEP. No falls or LOB since last session.    Patient is accompained by: Family member    Pertinent History On 06/18/2021 patient was admitted to ED for acute LBP and decompensated from septic shock while awaiting imaging;  pt was transferred to ICU for Afib. Imaging and Labs resulted in Lumbar discitis and Osteomyelitis Dx. Pt Stabilized after 1 week and transferred to [facility] to wean supplemental O2 and continue Atbx for MSSA bacteremia. Once discharged from [facility], patient received HHPT. Other PMH significant for CAD, GERD, left breast biopsy, angioplasty with stent placement, hyperlipidemia, Hyperglycemia, Gout, nerve pain, and NSVT.    Limitations Standing;Lifting;Walking    Patient Stated Goals "I'd like to not have to use the Rollator or a shower chair anymore".    Currently in Pain? No/denies                  TREATMENT:   Standing with CGA next to support surface:  Airex pad: static stand 30 seconds x 2 trials, noticeable trembling of ankles/LE's with fatigue and challenge to maintain stability Airex pad: horizontal head turns 30 seconds scanning room 10x ; cueing for arc of motion  Airex pad: vertical head turns 30 seconds, cueing for arc of motion, noticeable sway with upward gaze increasing demand on ankle righting reaction musculature Airex pad: one foot on 6" step one foot on airex pad, hold position for 30 seconds, switch legs, 2x each LE;   Laterally lunge modified for bosu 12x each LE with BUE support Forward modified lunge SUE support x12 each LE with SUE support    Sit to stand 2000 gm weighted ball overhead press 10x; 2 rest breaks required   Seated:  GTB hamstring curl 15x each LE  GTB adduction 12x each LE GTB row 15x    Pt educated throughout session about proper posture and technique with exercises. Improved exercise technique, movement at target joints, use of target muscles after min to mod verbal, visual, tactile cues.   Patient requires SP02 monitoring throughout session due to drop to 85% with exercise.    Patient presents with excellent motivation throughout physical therapy session. She does fatigue quickly requiring intermittent rest breaks. Prolonged  standing is limited and will be an area for continued progress. Patient's sit to stands are limited and will require continued focused therapy. Patient would benefit from additional skilled PT Intervention to improve strength, balance and mobility                       PT Education - 12/24/21 1524     Education Details exercise technique, body mechanics    Person(s) Educated Spouse;Patient    Methods Explanation;Demonstration;Tactile cues;Verbal cues    Comprehension Verbalized understanding;Verbal cues required;Returned demonstration;Tactile cues required              PT Short Term Goals - 12/17/21 1424       PT SHORT TERM GOAL #1   Title Pt will be independent with HEP in order to improve endurance and balance to decrease fall risk and improve function at home and with grandkids.    Baseline Has HEP- has not  been doing it due to medical issues;    Time 4    Period Weeks    Status On-going    Target Date 01/14/22               PT Long Term Goals - 12/17/21 1218       PT LONG TERM GOAL #1   Title Pt will decrease 5TSTS by at least 3 seconds in order to demonstrate clinically significant improvement in LE strength.    Baseline 11/9: 15.15sec with no UE support, 2/7: 16.75 sec without UE assist    Time 8    Period Weeks    Status On-going    Target Date 02/11/22      PT LONG TERM GOAL #2   Title Pt will increase 6MWT to >1545 ft in order to demonstrate clinically significant improvement in cardiopulmonary endurance and safety with community ambulation compared to age matched peers.    Baseline 11/9: 520 ft with 1 seated rest break at 340 ft, 2/7: 430 feet with rollator    Time 8    Period Weeks    Status On-going    Target Date 02/11/22      PT LONG TERM GOAL #3   Title Pt will increase gait speed in 10MWT to >1.45ms without AD to improve safe community ambulation and independence.    Baseline 11/9: 0.692m without AD. 2/7: 1.2 m/s with rollator,  0.98 m/s without AD    Time 8    Period Weeks    Status Partially Met    Target Date 02/11/22      PT LONG TERM GOAL #4   Title Patient will report taking a standing shower with no LOB or fatigue and no use of shower chair.    Baseline 11/9: Patient unable to take shower without using shower chair. 2/7: able to stand some    Time 8    Period Weeks    Status On-going    Target Date 02/11/22      PT LONG TERM GOAL #5   Title Patient will increase FOTO score to equal to or greater than 54% to demonstrate statistically significant improvement in mobility and quality of life.    Baseline 11/9: 46%    Time 8    Period Weeks    Status On-going    Target Date 02/11/22                   Plan - 12/24/21 1528     Clinical Impression Statement Patient presents with excellent motivation throughout physical therapy session. She does fatigue quickly requiring intermittent rest breaks. Prolonged standing is limited and will be an area for continued progress. Patient's sit to stands are limited and will require continued focused therapy. Patient would benefit from additional skilled PT Intervention to improve strength, balance and mobility    Personal Factors and Comorbidities Age;Comorbidity 3+;Fitness;Past/Current Experience    Examination-Activity Limitations Lift;Carry;Reach Overhead    Examination-Participation Restrictions Church;Volunteer;Yard Work;Community Activity;Driving    Stability/Clinical Decision Making Evolving/Moderate complexity    Rehab Potential Good    PT Frequency 2x / week    PT Duration 8 weeks    PT Treatment/Interventions ADLs/Self Care Home Management;Moist Heat;DME Instruction;Gait training;Stair training;Functional mobility training;Therapeutic activities;Therapeutic exercise;Balance training;Neuromuscular re-education;Patient/family education;Energy conservation;Manual techniques    PT Next Visit Plan assess balance/gait via FGA, advance HEP    PT Home  Exercise Plan no change this session;    Consulted and Agree with Plan of Care  Patient;Family member/caregiver    Family Member Consulted Husband             Patient will benefit from skilled therapeutic intervention in order to improve the following deficits and impairments:  Abnormal gait, Decreased balance, Decreased endurance, Difficulty walking, Cardiopulmonary status limiting activity, Pain, Decreased activity tolerance, Decreased strength, Postural dysfunction  Visit Diagnosis: Other abnormalities of gait and mobility  Unsteadiness on feet  Abnormal posture     Problem List Patient Active Problem List   Diagnosis Date Noted   Healthcare-associated pneumonia 10/20/2021   HCAP (healthcare-associated pneumonia) 10/20/2021   Nerve pain 08/28/2021   Lumbar discitis 08/07/2021   Septic embolism ([place]) 08/07/2021   Hyponatremia 07/31/2021   Situational anxiety 07/31/2021   Prerenal azotemia 07/31/2021   Abnormal LFTs 07/31/2021   Hypoalbuminemia 07/31/2021   Acute gout    Hyperglycemia    Leukocytosis    Loose stools    Debility 07/10/2021   Post-operative pain    Bacteremia    Acute gout involving toe    Incontinence of feces    Paroxysmal atrial fibrillation ([facility])    Hypoxemia    Back pain 06/19/2021   Septic shock ([place]) 06/19/2021   COPD with acute exacerbation ([place]) 06/19/2021   Acute hypoxemic respiratory failure ([place]) 06/19/2021   Pulmonary nodules 06/19/2021   MSSA bacteremia 06/19/2021   Upper GI bleed 03/10/2020   Nausea 03/10/2020   Duodenal ulcer    Dieulafoy lesion of duodenum    Gastritis and gastroduodenitis    Coronary atherosclerosis of native coronary artery 11/10/2012   Tobacco abuse 11/09/2012   NSVT (nonsustained ventricular tachycardia) 11/09/2012   Acute paranoia ([place]) 11/09/2012   Severe muscle deconditioning 11/09/2012   Hyperlipidemia 11/09/2012   Hypokalemia 11/05/2012   Acute blood loss anemia 11/01/2012   H1N1 influenza with  pneumonia 10/31/2012   Hypertension 10/29/2012   GERD (gastroesophageal reflux disease) 10/29/2012   Acute respiratory failure ([place]) 10/29/2012   Cardiogenic shock ([place]) 10/29/2012   Cardiac arrest ([place]) 10/29/2012   ST elevation myocardial infarction (STEMI) of inferior wall ([place]) 10/29/2012    Janna Arch, PT, DPT  12/24/2021, 3:29 PM  [place] MAIN [facility] SERVICES [address] [place], [place], [address] Phone: [phone]   Fax:  [phone]  Name: Samantha King MRN: [id number] Date of Birth: [birth year]

## 2021-12-26 ENCOUNTER — Encounter: Payer: Self-pay | Admitting: Physical Therapy

## 2021-12-26 ENCOUNTER — Ambulatory Visit: Payer: Medicare Other | Admitting: Physical Therapy

## 2021-12-26 ENCOUNTER — Other Ambulatory Visit: Payer: Self-pay

## 2021-12-26 DIAGNOSIS — R2681 Unsteadiness on feet: Secondary | ICD-10-CM

## 2021-12-26 DIAGNOSIS — R293 Abnormal posture: Secondary | ICD-10-CM

## 2021-12-26 DIAGNOSIS — R2689 Other abnormalities of gait and mobility: Secondary | ICD-10-CM
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
## 2021-12-26 NOTE — Patient Instructions (Signed)
WALKING  Walking is a great form of exercise to increase your strength, endurance and overall fitness.  A walking program can help you start slowly and gradually build endurance as you go.  Everyone's ability is different, so each person's starting point will be different.  You do not have to follow them exactly.  The are just samples. You should simply find out what's right for you and stick to that program.   In the beginning, you'll start off walking 2-3 times a day for short distances.  As you get stronger, you'll be walking further at just 1-2 times per day.  A. You Can Walk For A Certain Length Of Time Each Day    Walk 2 minutes 2 times per day.  Increase 1 minutes every 3-5 days (2 times per day).  Work up to 25-30 minutes (1-2 times per day).   Example:   Day 1-2 2 minutes 2 times per day   Day 7-8 4 minutes 2 times per day   Day 13-14 8-10 minutes 1-2 times per day  B. You Can Walk For a Certain Distance Each Day     Distance can be substituted for time.    Example:   3 trips to mailbox (at road)   3 trips to corner of block   3 trips around the block

## 2021-12-31 ENCOUNTER — Ambulatory Visit: Payer: Medicare Other | Admitting: Physical Therapy

## 2022-01-01 ENCOUNTER — Ambulatory Visit: Payer: Medicare Other

## 2022-01-02 ENCOUNTER — Ambulatory Visit: Payer: Medicare Other | Admitting: Physical Therapy

## 2022-01-06 ENCOUNTER — Ambulatory Visit: Payer: Medicare Other

## 2022-01-08 ENCOUNTER — Ambulatory Visit: Payer: Medicare Other

## 2022-01-09 ENCOUNTER — Ambulatory Visit: Payer: Medicare Other | Attending: Physical Medicine and Rehabilitation

## 2022-01-09 ENCOUNTER — Other Ambulatory Visit: Payer: Self-pay

## 2022-01-09 ENCOUNTER — Encounter: Payer: Self-pay | Admitting: Physical Therapy

## 2022-01-09 DIAGNOSIS — R293 Abnormal posture: Secondary | ICD-10-CM | POA: Insufficient documentation

## 2022-01-09 DIAGNOSIS — R2689 Other abnormalities of gait and mobility: Secondary | ICD-10-CM | POA: Diagnosis not present

## 2022-01-09 DIAGNOSIS — R2681 Unsteadiness on feet: Secondary | ICD-10-CM | POA: Insufficient documentation

## 2022-01-09 NOTE — Therapy (Signed)
[place] MAIN [facility] SERVICES [address] [place], [place], [address] Phone: [phone]   Fax:  [phone]  Physical Therapy Treatment  Patient Details  Name: Samantha King MRN: [id number] Date of Birth: 07-20-1946 Referring Provider (PT): Courtney Heys, MD   Encounter Date: 01/09/2022   PT End of Session - 01/09/22 1357     Visit Number 8    Number of Visits 33    Date for PT Re-Evaluation 02/11/22    Authorization Type eval 11/9, re-eval 12/17/21    Progress Note Due on Visit 10    PT Start Time 2951    PT Stop Time 1430    PT Time Calculation (min) 42 min    Equipment Utilized During Treatment Gait belt    Activity Tolerance Patient tolerated treatment well;Patient limited by fatigue    Behavior During Therapy [facility] for tasks assessed/performed             Past Medical History:  Diagnosis Date   Atrial fibrillation ([place])    CAD (coronary artery disease)    a. s/p INF-LAT STEMI => s/p DES-RCA c/b VF arrest and cardiogenic shock   Cardiac arrest ([place]) 10/2012   STEMI with VF arrest x 2    Complication of anesthesia    ICU delerium after 7 day "induced coma" after MI in 2013   GERD (gastroesophageal reflux disease)    H1N1 influenza 10/2012   Hx of echocardiogram    a. Echo 10/30/12: Moderate LVH, EF 55-60%, normal wall motion, PASP 45   Hyperlipidemia    Hypertension    Myocardial infarction ([place])    Pneumonia 10/2012   a. STEMI c/b LLL pneumonia in setting of recent H1N1 influenza   Pneumonia 10/20/2021   Completed meds.  Symptom free 11/03/21   PONV (postoperative nausea and vomiting)    after BTL   PUD (peptic ulcer disease)    PVC (premature ventricular contraction)    Syncope 1996   reported episode while driving in [place] in 1996 with full cardiac workup = negative   Tobacco abuse     Past Surgical History:  Procedure Laterality Date   BREAST EXCISIONAL BIOPSY     left breast ? 1981   CATARACT  EXTRACTION W/PHACO Left 11/20/2021   Procedure: CATARACT EXTRACTION PHACO AND INTRAOCULAR LENS PLACEMENT (IOC) LEFT 3.91 00:48.3;  Surgeon: Leandrew Koyanagi, MD;  Location: [facility];  Service: Ophthalmology;  Laterality: Left;   CORONARY ANGIOPLASTY WITH STENT PLACEMENT     s/p inferior STEMI c/b VF arrest and cardiogenic shock requiring IABP   ESOPHAGOGASTRODUODENOSCOPY (EGD) WITH PROPOFOL N/A 03/10/2020   Procedure: ESOPHAGOGASTRODUODENOSCOPY (EGD) WITH PROPOFOL;  Surgeon: Lavena Bullion, DO;  Location: [facility] ENDOSCOPY;  Service: Gastroenterology;  Laterality: N/A;   HEMOSTASIS CLIP PLACEMENT  03/10/2020   Procedure: HEMOSTASIS CLIP PLACEMENT;  Surgeon: Lavena Bullion, DO;  Location: [facility] ENDOSCOPY;  Service: Gastroenterology;;   HEMOSTASIS CONTROL  03/10/2020   Procedure: HEMOSTASIS CONTROL;  Surgeon: Lavena Bullion, DO;  Location: [facility] ENDOSCOPY;  Service: Gastroenterology;;  epi   INTRA-AORTIC BALLOON PUMP INSERTION  10/29/2012   Procedure: INTRA-AORTIC BALLOON PUMP INSERTION;  Surgeon: Burnell Blanks, MD;  Location: [facility] CATH LAB;  Service: Cardiovascular;;   LEFT HEART CATHETERIZATION WITH CORONARY ANGIOGRAM N/A 10/29/2012   Procedure: LEFT HEART CATHETERIZATION WITH CORONARY ANGIOGRAM;  Surgeon: Burnell Blanks, MD;  Location: [facility] CATH LAB;  Service: Cardiovascular;  Laterality: N/A;   PERCUTANEOUS CORONARY STENT INTERVENTION (PCI-S)  10/29/2012   Procedure: PERCUTANEOUS CORONARY STENT INTERVENTION (PCI-S);  Surgeon: Burnell Blanks, MD;  Location: [facility] CATH LAB;  Service: Cardiovascular;;   TUBAL LIGATION      There were no vitals filed for this visit.   Subjective Assessment - 01/09/22 1356     Subjective Pt reports being sick from Norovirus after traveling to [place]. Reports energy levels as low. No falls and has not been completing HEP due to feeling unwell.    Patient is accompained by: Family member    Pertinent History On 06/18/2021 patient was  admitted to ED for acute LBP and decompensated from septic shock while awaiting imaging; pt was transferred to ICU for Afib. Imaging and Labs resulted in Lumbar discitis and Osteomyelitis Dx. Pt Stabilized after 1 week and transferred to [facility] to wean supplemental O2 and continue Atbx for MSSA bacteremia. Once discharged from [facility], patient received HHPT. Other PMH significant for CAD, GERD, left breast biopsy, angioplasty with stent placement, hyperlipidemia, Hyperglycemia, Gout, nerve pain, and NSVT.    Limitations Standing;Lifting;Walking    Patient Stated Goals "I'd like to not have to use the Rollator or a shower chair anymore".    Currently in Pain? No/denies            There.ex:  Warm up on Nustep BUE/LE level 6 min (Unbilled); Vitals assessed after exercise   Neuro Re-Ed:    Forwards/backwards tandem walking: x6 with CGA. Cuing for decreased speed, true heel to toe ambulation to further challenge balance  Airex beam:  tandem walking x6 laps CGA    Side steps x6 laps CGA. VC's for marching to improve SLS challenge, foot clearance.    SLS: 3x20 sec bilat. Cuing to prevent BUE support. Good challenge noted with hip/ankle strategy with single finger support on // bars.    Airex pad:   Narrow BOS: 3x30 sec. A/P sway with challenge to ankle strategy. CGA   Narrow BOS with EC: 3x30 sec. Increased A/P sway to ankle and hip strategy. Intermittent need for reaching for // bar for support. CGA.      Pt requiring frequent seated rest breaks due to recovery from recent illness. Pt requiring mod VC's throughout session to reduce need for UE support on surfaces with balancing tasks. CGA provided throughout session for pt safety.   PT Education - 01/09/22 1357     Education Details form/technique with exercise.    Person(s) Educated Patient;Spouse    Methods Explanation;Verbal cues    Comprehension Verbalized understanding;Returned demonstration;Need further  instruction              PT Short Term Goals - 12/17/21 1424       PT SHORT TERM GOAL #1   Title Pt will be independent with HEP in order to improve endurance and balance to decrease fall risk and improve function at home and with grandkids.    Baseline Has HEP- has not been doing it due to medical issues;    Time 4    Period Weeks    Status On-going    Target Date 01/14/22               PT Long Term Goals - 12/17/21 1218       PT LONG TERM GOAL #1   Title Pt will decrease 5TSTS by at least 3 seconds in order to demonstrate clinically significant improvement in LE strength.    Baseline 11/9: 15.15sec with no UE support, 2/7: 16.75 sec without UE  assist    Time 8    Period Weeks    Status On-going    Target Date 02/11/22      PT LONG TERM GOAL #2   Title Pt will increase 6MWT to >1545 ft in order to demonstrate clinically significant improvement in cardiopulmonary endurance and safety with community ambulation compared to age matched peers.    Baseline 11/9: 520 ft with 1 seated rest break at 340 ft, 2/7: 430 feet with rollator    Time 8    Period Weeks    Status On-going    Target Date 02/11/22      PT LONG TERM GOAL #3   Title Pt will increase gait speed in 10MWT to >1.59ms without AD to improve safe community ambulation and independence.    Baseline 11/9: 0.698m without AD. 2/7: 1.2 m/s with rollator, 0.98 m/s without AD    Time 8    Period Weeks    Status Partially Met    Target Date 02/11/22      PT LONG TERM GOAL #4   Title Patient will report taking a standing shower with no LOB or fatigue and no use of shower chair.    Baseline 11/9: Patient unable to take shower without using shower chair. 2/7: able to stand some    Time 8    Period Weeks    Status On-going    Target Date 02/11/22      PT LONG TERM GOAL #5   Title Patient will increase FOTO score to equal to or greater than 54% to demonstrate statistically significant improvement in mobility and  quality of life.    Baseline 11/9: 46%    Time 8    Period Weeks    Status On-going    Target Date 02/11/22                   Plan - 01/09/22 1612     Clinical Impression Statement Pt limited overall in volume of exercise today due to recovering from recent illness. Frequent seated rest breaks requested and needed throughout. Focus on light, dynamic balance with narrowed BOS on/off unstable surfaces. Mild to moderate ankle strategy challenge noted throughout session especially with removal of vision. Pt will continue to benefit from skilled PT services to improve overall strength and balance.    Personal Factors and Comorbidities Age;Comorbidity 3+;Fitness;Past/Current Experience    Examination-Activity Limitations Lift;Carry;Reach Overhead    Examination-Participation Restrictions Church;Volunteer;Yard Work;Community Activity;Driving    Stability/Clinical Decision Making Evolving/Moderate complexity    Rehab Potential Good    PT Frequency 2x / week    PT Duration 8 weeks    PT Treatment/Interventions ADLs/Self Care Home Management;Moist Heat;DME Instruction;Gait training;Stair training;Functional mobility training;Therapeutic activities;Therapeutic exercise;Balance training;Neuromuscular re-education;Patient/family education;Energy conservation;Manual techniques    PT Next Visit Plan assess balance/gait via FGA, advance HEP    PT Home Exercise Plan no change this session;    Consulted and Agree with Plan of Care Patient;Family member/caregiver    Family Member Consulted Husband             Patient will benefit from skilled therapeutic intervention in order to improve the following deficits and impairments:  Abnormal gait, Decreased balance, Decreased endurance, Difficulty walking, Cardiopulmonary status limiting activity, Pain, Decreased activity tolerance, Decreased strength, Postural dysfunction  Visit Diagnosis: Other abnormalities of gait and mobility  Unsteadiness  on feet  Abnormal posture     Problem List Patient Active Problem List   Diagnosis Date Noted  Healthcare-associated pneumonia 10/20/2021   HCAP (healthcare-associated pneumonia) 10/20/2021   Nerve pain 08/28/2021   Lumbar discitis 08/07/2021   Septic embolism ([place]) 08/07/2021   Hyponatremia 07/31/2021   Situational anxiety 07/31/2021   Prerenal azotemia 07/31/2021   Abnormal LFTs 07/31/2021   Hypoalbuminemia 07/31/2021   Acute gout    Hyperglycemia    Leukocytosis    Loose stools    Debility 07/10/2021   Post-operative pain    Bacteremia    Acute gout involving toe    Incontinence of feces    Paroxysmal atrial fibrillation (HCC)    Hypoxemia    Back pain 06/19/2021   Septic shock ([place]) 06/19/2021   COPD with acute exacerbation ([place]) 06/19/2021   Acute hypoxemic respiratory failure ([place]) 06/19/2021   Pulmonary nodules 06/19/2021   MSSA bacteremia 06/19/2021   Upper GI bleed 03/10/2020   Nausea 03/10/2020   Duodenal ulcer    Dieulafoy lesion of duodenum    Gastritis and gastroduodenitis    Coronary atherosclerosis of native coronary artery 11/10/2012   Tobacco abuse 11/09/2012   NSVT (nonsustained ventricular tachycardia) 11/09/2012   Acute paranoia ([place]) 11/09/2012   Severe muscle deconditioning 11/09/2012   Hyperlipidemia 11/09/2012   Hypokalemia 11/05/2012   Acute blood loss anemia 11/01/2012   H1N1 influenza with pneumonia 10/31/2012   Hypertension 10/29/2012   GERD (gastroesophageal reflux disease) 10/29/2012   Acute respiratory failure ([place]) 10/29/2012   Cardiogenic shock ([place]) 10/29/2012   Cardiac arrest ([place]) 10/29/2012   ST elevation myocardial infarction (STEMI) of inferior wall ([place]) 10/29/2012    Salem Caster. Fairly IV, PT, DPT Physical Therapist- [facility]  01/09/2022, 4:23 PM  [place] MAIN [facility] SERVICES [address] [place], [place], [address] Phone: [phone]    Fax:  [phone]  Name: Samantha King MRN: [id number] Date of Birth: Jul 04, 1946

## 2022-01-13 ENCOUNTER — Other Ambulatory Visit: Payer: Self-pay | Admitting: Cardiovascular Disease

## 2022-01-13 MED ORDER — METOPROLOL TARTRATE 25 MG PO TABS: 25.0000 mg | ORAL_TABLET | Freq: Two times a day (BID) | ORAL | 3 refills | Status: AC

## 2022-01-13 NOTE — Telephone Encounter (Signed)
Pt is requesting a refill on nitroglycerin, this medication has expired off of pt's medication list. Please address ?

## 2022-01-16 ENCOUNTER — Ambulatory Visit: Payer: Medicare Other | Admitting: Physical Therapy

## 2022-01-16 ENCOUNTER — Encounter: Payer: Self-pay | Admitting: Physical Therapy

## 2022-01-16 ENCOUNTER — Other Ambulatory Visit: Payer: Self-pay

## 2022-01-16 DIAGNOSIS — R2689 Other abnormalities of gait and mobility: Secondary | ICD-10-CM

## 2022-01-16 DIAGNOSIS — R2681 Unsteadiness on feet: Secondary | ICD-10-CM

## 2022-01-16 DIAGNOSIS — R293 Abnormal posture: Secondary | ICD-10-CM | POA: Diagnosis not present
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
## 2022-01-17 ENCOUNTER — Ambulatory Visit
Admission: RE | Admit: 2022-01-17 | Discharge: 2022-01-17 | Disposition: A | Discharge status: Home / Self Care | Payer: Medicare Other | Source: Ambulatory Visit | Attending: Internal Medicine | Admitting: Internal Medicine

## 2022-01-17 DIAGNOSIS — Z1231 Encounter for screening mammogram for malignant neoplasm of breast: Secondary | ICD-10-CM | POA: Diagnosis not present

## 2022-01-17 IMAGING — MG MM DIGITAL SCREENING BILAT W/ TOMO AND CAD
8 series · 8 of 24 positions shown · non-contrast
Comparison: Previous exam(s).

CLINICAL DATA: Screening.

EXAM:
DIGITAL SCREENING BILATERAL MAMMOGRAM WITH TOMOSYNTHESIS AND CAD
TECHNIQUE: Bilateral screening digital craniocaudal and mediolateral oblique
mammograms were obtained. Bilateral screening digital breast
tomosynthesis was performed. The images were evaluated with
computer-aided detection.

[L CC synth-2D]
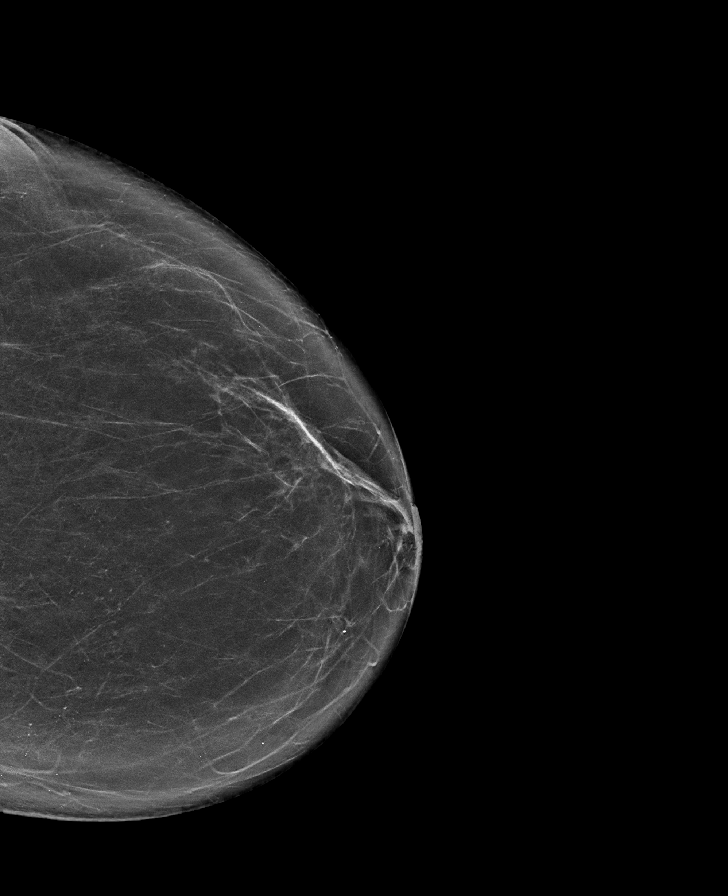

[L MLO synth-2D]
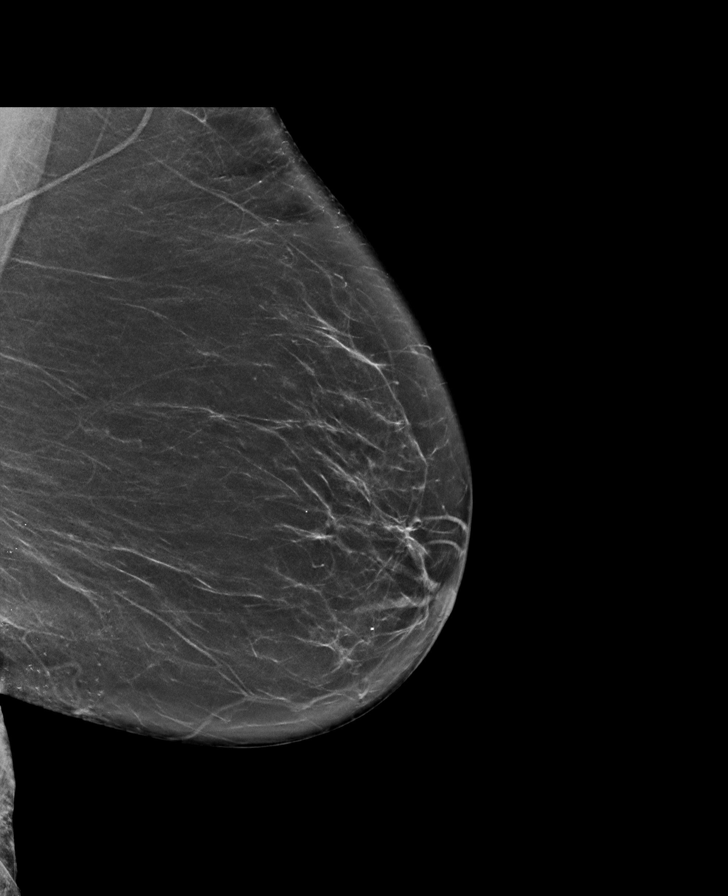

[R MLO synth-2D]
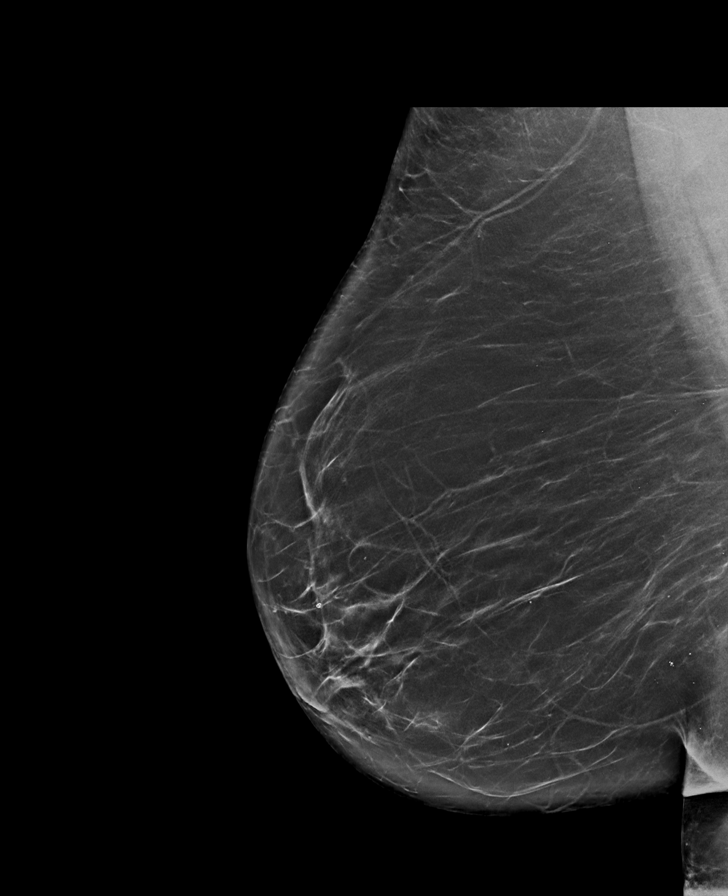

[R CC synth-2D]
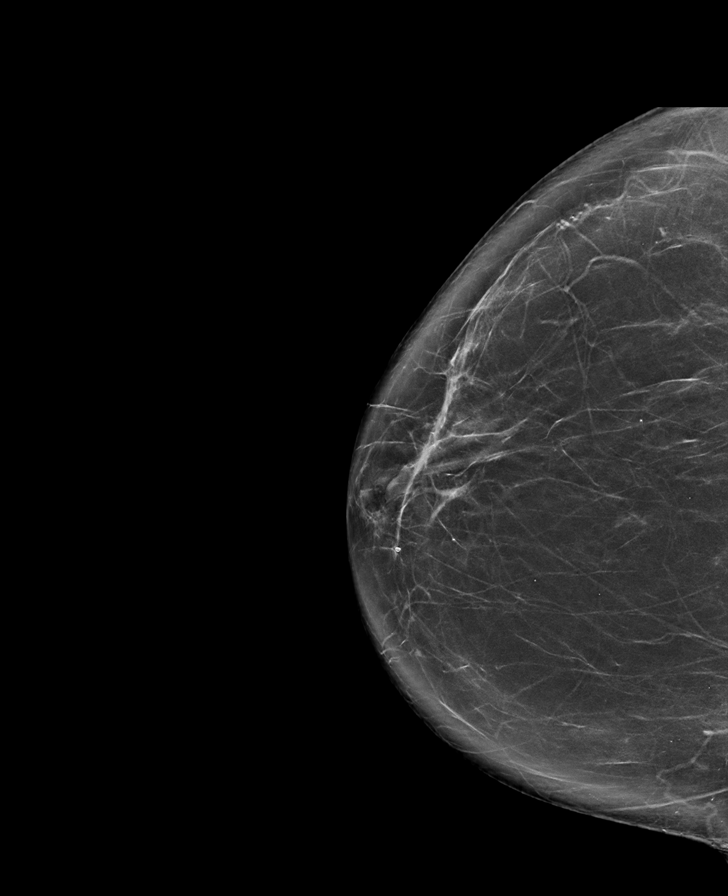

[R MLO tomo · tomo slice 44/87.0]
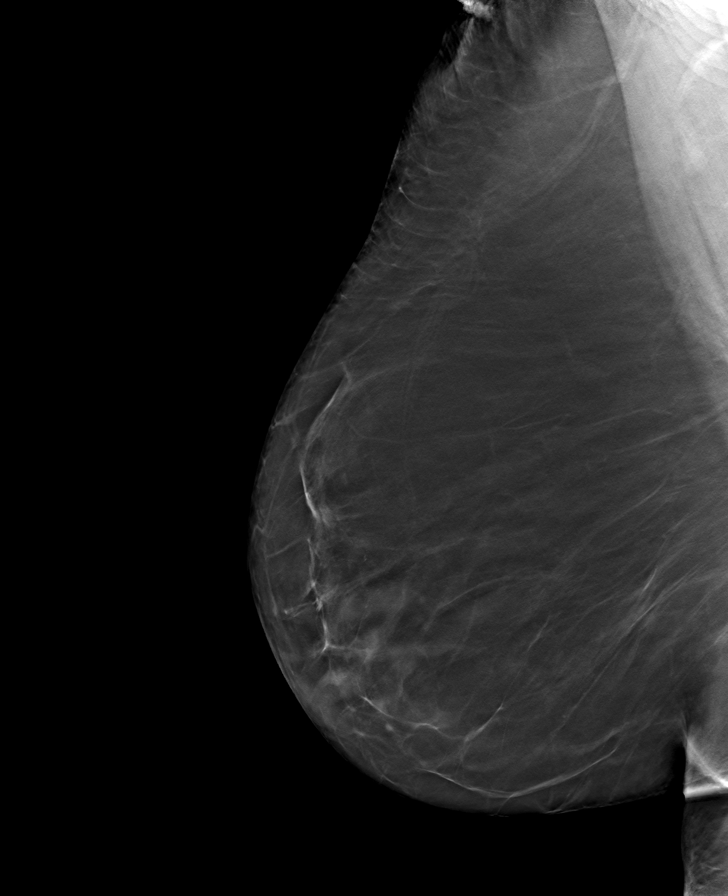

[L MLO tomo · tomo slice 41/81.0]
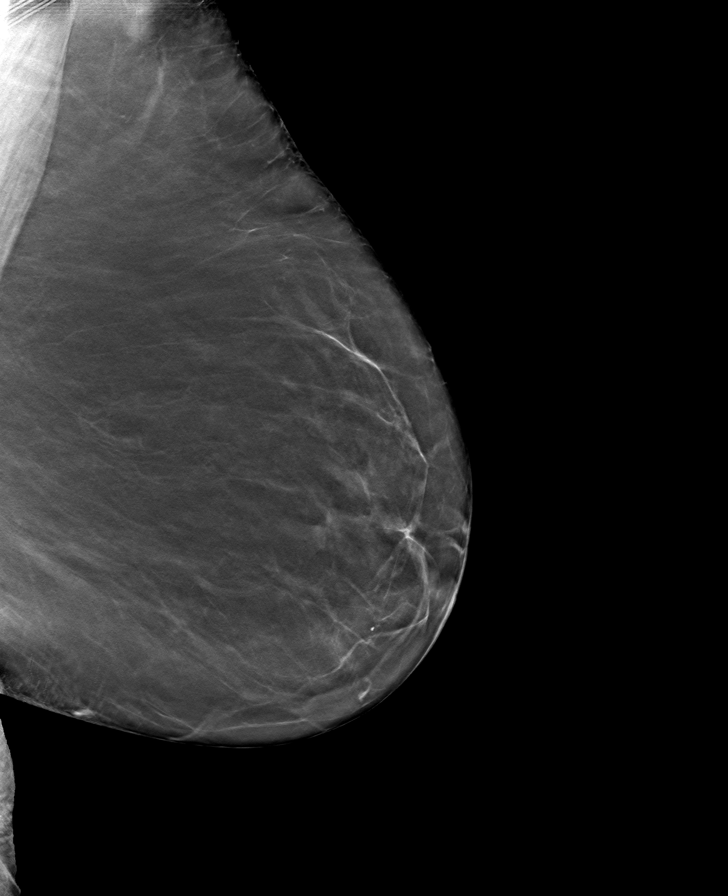

[R CC tomo · tomo slice 41/82.0]
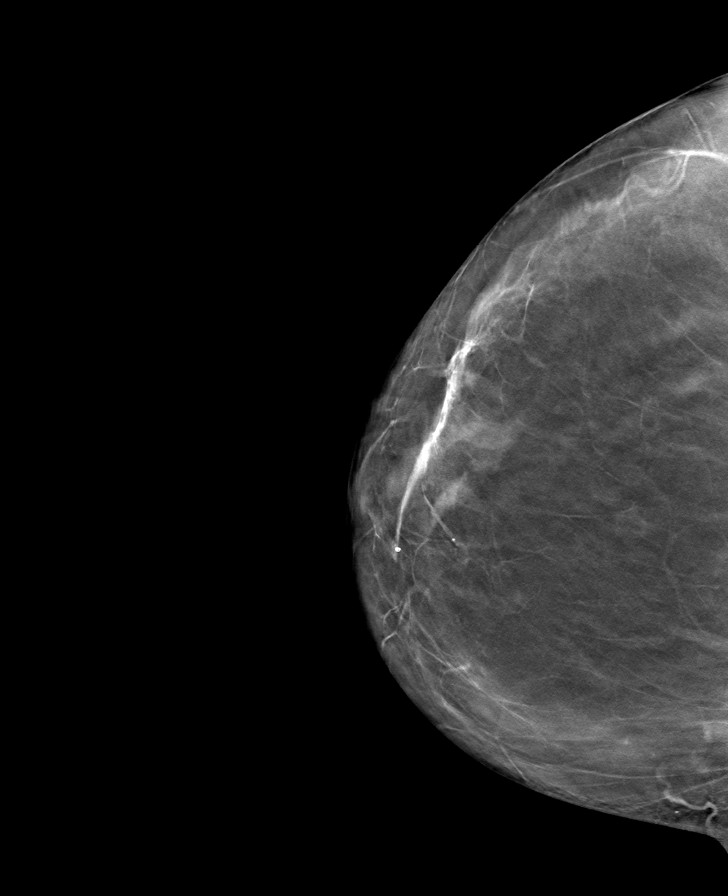

[L CC tomo · tomo slice 40/79.0]
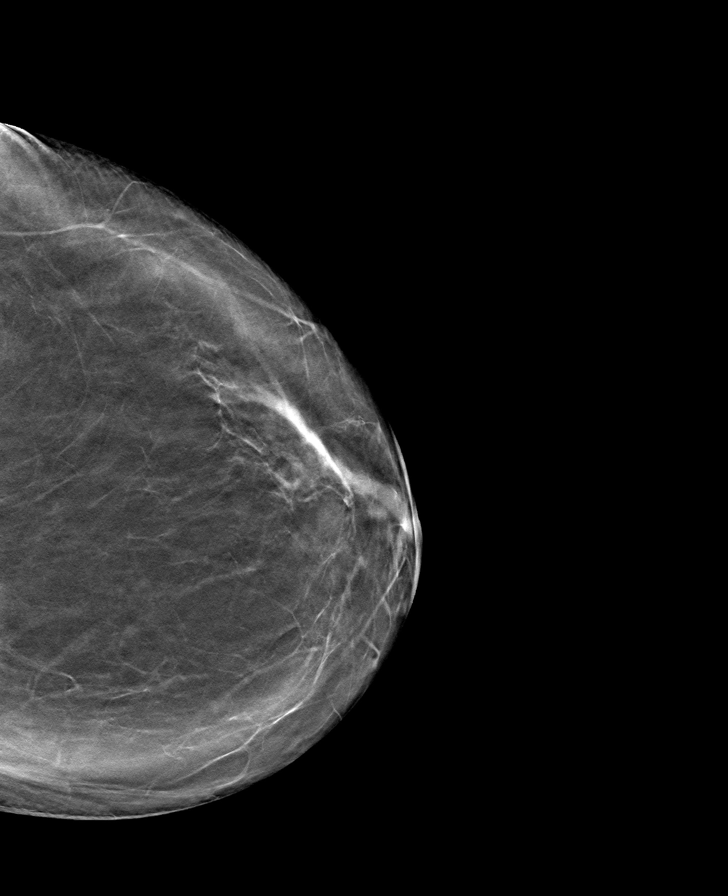

[8 of 24 positions shown; findings below may reference images not displayed]

ACR Breast Density Category b: There are scattered areas of
fibroglandular density.
FINDINGS: There are no findings suspicious for malignancy.
IMPRESSION: No mammographic evidence of malignancy. A result letter of this
screening mammogram will be mailed directly to the patient.

RECOMMENDATION:
Screening mammogram in one year. (Code:[BY])

BI-RADS CATEGORY  1: Negative.

## 2022-01-21 ENCOUNTER — Ambulatory Visit: Payer: Medicare Other

## 2022-01-21 ENCOUNTER — Other Ambulatory Visit: Payer: Self-pay

## 2022-01-21 DIAGNOSIS — R2689 Other abnormalities of gait and mobility: Secondary | ICD-10-CM | POA: Diagnosis not present

## 2022-01-21 DIAGNOSIS — R293 Abnormal posture: Secondary | ICD-10-CM | POA: Diagnosis not present

## 2022-01-21 DIAGNOSIS — R2681 Unsteadiness on feet: Secondary | ICD-10-CM
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
## 2022-01-23 ENCOUNTER — Encounter: Payer: Self-pay | Admitting: Physical Therapy

## 2022-01-23 ENCOUNTER — Ambulatory Visit: Payer: Medicare Other | Admitting: Physical Therapy

## 2022-01-23 ENCOUNTER — Other Ambulatory Visit: Payer: Self-pay

## 2022-01-23 DIAGNOSIS — R2681 Unsteadiness on feet: Secondary | ICD-10-CM | POA: Diagnosis not present

## 2022-01-23 DIAGNOSIS — R293 Abnormal posture: Secondary | ICD-10-CM | POA: Diagnosis not present

## 2022-01-23 DIAGNOSIS — R2689 Other abnormalities of gait and mobility: Secondary | ICD-10-CM | POA: Diagnosis not present
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
## 2022-01-27 ENCOUNTER — Other Ambulatory Visit: Payer: Self-pay

## 2022-01-27 ENCOUNTER — Other Ambulatory Visit: Payer: Self-pay | Admitting: Physical Medicine and Rehabilitation

## 2022-01-27 ENCOUNTER — Ambulatory Visit: Payer: Medicare Other

## 2022-01-27 DIAGNOSIS — R293 Abnormal posture: Secondary | ICD-10-CM

## 2022-01-27 DIAGNOSIS — R2689 Other abnormalities of gait and mobility: Secondary | ICD-10-CM | POA: Diagnosis not present

## 2022-01-27 DIAGNOSIS — R2681 Unsteadiness on feet: Secondary | ICD-10-CM | POA: Diagnosis not present
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
## 2022-01-28 ENCOUNTER — Ambulatory Visit: Payer: Medicare Other | Admitting: Cardiovascular Disease

## 2022-01-29 ENCOUNTER — Ambulatory Visit: Payer: Medicare Other | Admitting: Physical Therapy

## 2022-01-29 ENCOUNTER — Other Ambulatory Visit: Payer: Self-pay

## 2022-01-29 ENCOUNTER — Encounter: Payer: Self-pay | Admitting: Physical Therapy

## 2022-01-29 DIAGNOSIS — R2689 Other abnormalities of gait and mobility: Secondary | ICD-10-CM | POA: Diagnosis not present

## 2022-01-29 DIAGNOSIS — R2681 Unsteadiness on feet: Secondary | ICD-10-CM

## 2022-01-29 DIAGNOSIS — R293 Abnormal posture: Secondary | ICD-10-CM
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
## 2022-01-29 NOTE — Patient Instructions (Signed)
Access Code: [email or web address] ?URL: [email or web address] ?Date: 01/29/2022 ?Prepared by: Blanche East ? ?Exercises ?Standing Heel Raise with Chair Support - 1 x daily - 7 x weekly - 2 sets - 10 reps ?Standing March with Unilateral Counter Support - 1 x daily - 7 x weekly - 1 sets - 15 reps ?Standing Bilateral Low Shoulder Row with Anchored Resistance - 1 x daily - 4 x weekly - 2 sets - 10 reps ?Shoulder Extension with Resistance - 1 x daily - 4 x weekly - 2 sets - 10 reps ?Sit to Stand with Armchair - 1 x daily - 4 x weekly - 2 sets - 5 reps ?Standing Hip Abduction with Resistance at Ankles and Counter Support - 1 x daily - 4 x weekly - 1 sets - 10 reps ?Standing Hip Extension with Resistance at Ankles and Counter Support - 1 x daily - 4 x weekly - 1 sets - 10 reps ? ?

## 2022-02-05 ENCOUNTER — Ambulatory Visit: Payer: Medicare Other | Admitting: Physical Therapy

## 2022-02-17 ENCOUNTER — Telehealth: Payer: Self-pay

## 2022-02-17 NOTE — Telephone Encounter (Signed)
Pharmacy is calling to request a refill on Buspirone 5 mg. Patient was last seen in October 2022 and cancelled appointment in January 2023 ?

## 2022-02-21 ENCOUNTER — Other Ambulatory Visit: Payer: Self-pay | Admitting: Physical Medicine and Rehabilitation

## 2022-02-21 NOTE — Telephone Encounter (Signed)
Pharmacy informed to send Buspirone to PCP Dr. Domenick Gong ?

## 2022-02-27 DIAGNOSIS — J069 Acute upper respiratory infection, unspecified: Secondary | ICD-10-CM | POA: Diagnosis not present

## 2022-03-13 DIAGNOSIS — I10 Essential (primary) hypertension: Secondary | ICD-10-CM | POA: Diagnosis not present

## 2022-03-13 DIAGNOSIS — J4530 Mild persistent asthma, uncomplicated: Secondary | ICD-10-CM | POA: Diagnosis not present

## 2022-03-13 DIAGNOSIS — M159 Polyosteoarthritis, unspecified: Secondary | ICD-10-CM | POA: Diagnosis not present

## 2022-03-13 DIAGNOSIS — Z6829 Body mass index (BMI) 29.0-29.9, adult: Secondary | ICD-10-CM | POA: Diagnosis not present

## 2022-03-13 DIAGNOSIS — I2510 Atherosclerotic heart disease of native coronary artery without angina pectoris: Secondary | ICD-10-CM | POA: Diagnosis not present

## 2022-03-13 DIAGNOSIS — R531 Weakness: Secondary | ICD-10-CM | POA: Diagnosis not present

## 2022-03-13 DIAGNOSIS — E7800 Pure hypercholesterolemia, unspecified: Secondary | ICD-10-CM | POA: Diagnosis not present

## 2022-03-13 DIAGNOSIS — R5381 Other malaise: Secondary | ICD-10-CM | POA: Diagnosis not present

## 2022-04-19 DIAGNOSIS — I10 Essential (primary) hypertension: Secondary | ICD-10-CM | POA: Diagnosis not present

## 2022-04-19 DIAGNOSIS — E785 Hyperlipidemia, unspecified: Secondary | ICD-10-CM | POA: Diagnosis not present

## 2022-04-19 DIAGNOSIS — K5641 Fecal impaction: Secondary | ICD-10-CM | POA: Diagnosis not present

## 2022-04-19 DIAGNOSIS — Z91013 Allergy to seafood: Secondary | ICD-10-CM | POA: Diagnosis not present

## 2022-04-19 DIAGNOSIS — K5900 Constipation, unspecified: Secondary | ICD-10-CM | POA: Diagnosis not present

## 2022-04-19 DIAGNOSIS — Z87891 Personal history of nicotine dependence: Secondary | ICD-10-CM | POA: Diagnosis not present

## 2022-09-16 ENCOUNTER — Ambulatory Visit: Payer: Self-pay

## 2022-09-16 NOTE — Patient Instructions (Signed)
Visit Information  Thank you for taking time to visit with me today. Please don't hesitate to contact me if I can be of assistance to you.   Following are the goals we discussed today:   Goals Addressed             This Visit's Progress    COMPLETED: Care Coordination Activities       Care Coordination Interventions: Determined patient has moved to [place] and has established care with new providers Instructed the patient to contact her primary care provider as needed        If you are experiencing a Mental Health or [place] or need someone to talk to, please call the Suicide and Crisis Lifeline: [phone]  Patient verbalizes understanding of instructions and care plan provided today and agrees to view in [place]. Active MyChart status and patient understanding of how to access instructions and care plan via MyChart confirmed with patient.     No further follow up required: Please contact your primary care provider as needed.  Daneen Schick, BSW, CDP Social Worker, Certified Dementia Practitioner [place] [facility]  Care Coordination [phone]

## 2022-09-16 NOTE — Patient Outreach (Signed)
  Care Coordination   Initial Visit Note   09/16/2022 Name: Samantha King MRN: [id number] DOB: November 21, 1945  Samantha King is a 76 y.o. year old female who sees Tisovec, Fransico Him, MD for primary care. I spoke with  Joaquim Lai by phone today.  What matters to the patients health and wellness today?  No concerns, doing well    Goals Addressed             This Visit's Progress    COMPLETED: Care Coordination Activities       Care Coordination Interventions: Determined patient has moved to [place] and has established care with new providers Instructed the patient to contact her primary care provider as needed         SDOH assessments and interventions completed:  Yes  SDOH Interventions Today    Flowsheet Row Most Recent Value  SDOH Interventions   Food Insecurity Interventions Intervention Not Indicated  Housing Interventions Intervention Not Indicated  Transportation Interventions Intervention Not Indicated        Care Coordination Interventions Activated:  Yes  Care Coordination Interventions:  Yes, provided   Follow up plan: No further intervention required.   Encounter Outcome:  Pt. Visit Completed   Daneen Schick, BSW, CDP Social Worker, Certified Dementia Practitioner [place] [facility]  Care Coordination [phone]

## 2024-02-09 DEATH — deceased
